# Patient Record
Sex: Male | Born: 1945
Health system: Southern US, Community
[De-identification: ages and names within clinical notes are randomized; demographics above are authoritative.]

## PROBLEM LIST (undated history)

## (undated) DIAGNOSIS — N400 Enlarged prostate without lower urinary tract symptoms: Secondary | ICD-10-CM

## (undated) DIAGNOSIS — K5790 Diverticulosis of intestine, part unspecified, without perforation or abscess without bleeding: Secondary | ICD-10-CM

## (undated) DIAGNOSIS — F32A Depression, unspecified: Secondary | ICD-10-CM

## (undated) DIAGNOSIS — D509 Iron deficiency anemia, unspecified: Secondary | ICD-10-CM

## (undated) DIAGNOSIS — K219 Gastro-esophageal reflux disease without esophagitis: Secondary | ICD-10-CM

## (undated) DIAGNOSIS — R918 Other nonspecific abnormal finding of lung field: Secondary | ICD-10-CM

## (undated) DIAGNOSIS — E079 Disorder of thyroid, unspecified: Secondary | ICD-10-CM

## (undated) DIAGNOSIS — I10 Essential (primary) hypertension: Secondary | ICD-10-CM

## (undated) DIAGNOSIS — E538 Deficiency of other specified B group vitamins: Secondary | ICD-10-CM

## (undated) DIAGNOSIS — E669 Obesity, unspecified: Secondary | ICD-10-CM

## (undated) DIAGNOSIS — C439 Malignant melanoma of skin, unspecified: Secondary | ICD-10-CM

## (undated) DIAGNOSIS — K862 Cyst of pancreas: Secondary | ICD-10-CM

## (undated) HISTORY — DX: Cyst of pancreas: K86.2

## (undated) HISTORY — DX: Deficiency of other specified B group vitamins: E53.8

## (undated) HISTORY — DX: Iron deficiency anemia, unspecified: D50.9

## (undated) HISTORY — DX: Depression, unspecified: F32.A

## (undated) HISTORY — DX: Malignant melanoma of skin, unspecified: C43.9

## (undated) HISTORY — DX: Other nonspecific abnormal finding of lung field: R91.8

## (undated) HISTORY — DX: Essential (primary) hypertension: I10

## (undated) HISTORY — PX: WISDOM TOOTH EXTRACTION: SHX21

## (undated) HISTORY — DX: Disorder of thyroid, unspecified: E07.9

## (undated) HISTORY — DX: Diverticulosis of intestine, part unspecified, without perforation or abscess without bleeding: K57.90

## (undated) HISTORY — DX: Gastro-esophageal reflux disease without esophagitis: K21.9

## (undated) HISTORY — DX: Obesity, unspecified: E66.9

## (undated) HISTORY — DX: Benign prostatic hyperplasia without lower urinary tract symptoms: N40.0

---

## 1998-02-02 HISTORY — PX: CHOLECYSTECTOMY: SHX55

## 1998-02-02 HISTORY — PX: ROUX-EN-Y PROCEDURE: SUR1287

## 2009-02-02 HISTORY — PX: TOTAL SHOULDER REPLACEMENT: SUR1217

## 2010-02-02 HISTORY — PX: MOHS SURGERY: SUR867

## 2011-02-03 LAB — HM COLONOSCOPY

## 2011-04-06 DIAGNOSIS — E559 Vitamin D deficiency, unspecified: Secondary | ICD-10-CM | POA: Diagnosis not present

## 2011-04-06 DIAGNOSIS — I10 Essential (primary) hypertension: Secondary | ICD-10-CM | POA: Diagnosis not present

## 2011-04-06 DIAGNOSIS — E538 Deficiency of other specified B group vitamins: Secondary | ICD-10-CM | POA: Diagnosis not present

## 2011-04-06 DIAGNOSIS — N400 Enlarged prostate without lower urinary tract symptoms: Secondary | ICD-10-CM | POA: Diagnosis not present

## 2011-04-13 DIAGNOSIS — I10 Essential (primary) hypertension: Secondary | ICD-10-CM | POA: Diagnosis not present

## 2011-04-13 DIAGNOSIS — E538 Deficiency of other specified B group vitamins: Secondary | ICD-10-CM | POA: Diagnosis not present

## 2011-04-13 DIAGNOSIS — D649 Anemia, unspecified: Secondary | ICD-10-CM | POA: Diagnosis not present

## 2011-04-13 DIAGNOSIS — N400 Enlarged prostate without lower urinary tract symptoms: Secondary | ICD-10-CM | POA: Diagnosis not present

## 2011-04-13 DIAGNOSIS — E559 Vitamin D deficiency, unspecified: Secondary | ICD-10-CM | POA: Diagnosis not present

## 2011-04-20 DIAGNOSIS — L570 Actinic keratosis: Secondary | ICD-10-CM | POA: Diagnosis not present

## 2011-04-20 DIAGNOSIS — L820 Inflamed seborrheic keratosis: Secondary | ICD-10-CM | POA: Diagnosis not present

## 2011-04-20 DIAGNOSIS — Z85820 Personal history of malignant melanoma of skin: Secondary | ICD-10-CM | POA: Diagnosis not present

## 2011-05-18 DIAGNOSIS — Z1211 Encounter for screening for malignant neoplasm of colon: Secondary | ICD-10-CM | POA: Diagnosis not present

## 2011-05-28 DIAGNOSIS — Z5181 Encounter for therapeutic drug level monitoring: Secondary | ICD-10-CM | POA: Diagnosis not present

## 2011-05-28 DIAGNOSIS — D509 Iron deficiency anemia, unspecified: Secondary | ICD-10-CM | POA: Diagnosis not present

## 2011-05-28 DIAGNOSIS — Z9884 Bariatric surgery status: Secondary | ICD-10-CM | POA: Diagnosis not present

## 2011-05-28 DIAGNOSIS — Z87891 Personal history of nicotine dependence: Secondary | ICD-10-CM | POA: Diagnosis not present

## 2011-05-28 DIAGNOSIS — Z1211 Encounter for screening for malignant neoplasm of colon: Secondary | ICD-10-CM | POA: Diagnosis not present

## 2011-05-28 DIAGNOSIS — K648 Other hemorrhoids: Secondary | ICD-10-CM | POA: Diagnosis not present

## 2011-05-28 DIAGNOSIS — K5730 Diverticulosis of large intestine without perforation or abscess without bleeding: Secondary | ICD-10-CM | POA: Diagnosis not present

## 2011-06-10 DIAGNOSIS — E559 Vitamin D deficiency, unspecified: Secondary | ICD-10-CM | POA: Diagnosis not present

## 2011-11-19 DIAGNOSIS — D235 Other benign neoplasm of skin of trunk: Secondary | ICD-10-CM | POA: Diagnosis not present

## 2011-11-19 DIAGNOSIS — Z85820 Personal history of malignant melanoma of skin: Secondary | ICD-10-CM | POA: Diagnosis not present

## 2011-11-19 DIAGNOSIS — L821 Other seborrheic keratosis: Secondary | ICD-10-CM | POA: Diagnosis not present

## 2011-11-19 DIAGNOSIS — L219 Seborrheic dermatitis, unspecified: Secondary | ICD-10-CM | POA: Diagnosis not present

## 2011-11-25 DIAGNOSIS — H2510 Age-related nuclear cataract, unspecified eye: Secondary | ICD-10-CM | POA: Diagnosis not present

## 2012-01-22 DIAGNOSIS — D649 Anemia, unspecified: Secondary | ICD-10-CM | POA: Diagnosis not present

## 2012-01-22 DIAGNOSIS — E559 Vitamin D deficiency, unspecified: Secondary | ICD-10-CM | POA: Diagnosis not present

## 2012-01-22 DIAGNOSIS — I10 Essential (primary) hypertension: Secondary | ICD-10-CM | POA: Diagnosis not present

## 2012-01-22 DIAGNOSIS — Z9884 Bariatric surgery status: Secondary | ICD-10-CM | POA: Diagnosis not present

## 2012-01-22 DIAGNOSIS — Z23 Encounter for immunization: Secondary | ICD-10-CM | POA: Diagnosis not present

## 2012-01-22 DIAGNOSIS — Z0000 Encounter for general adult medical examination without abnormal findings: Secondary | ICD-10-CM | POA: Diagnosis not present

## 2012-01-22 DIAGNOSIS — R7309 Other abnormal glucose: Secondary | ICD-10-CM | POA: Diagnosis not present

## 2012-01-22 DIAGNOSIS — Z125 Encounter for screening for malignant neoplasm of prostate: Secondary | ICD-10-CM | POA: Diagnosis not present

## 2012-01-22 DIAGNOSIS — E538 Deficiency of other specified B group vitamins: Secondary | ICD-10-CM | POA: Diagnosis not present

## 2012-01-22 DIAGNOSIS — E785 Hyperlipidemia, unspecified: Secondary | ICD-10-CM | POA: Diagnosis not present

## 2012-05-26 DIAGNOSIS — L819 Disorder of pigmentation, unspecified: Secondary | ICD-10-CM | POA: Diagnosis not present

## 2012-05-26 DIAGNOSIS — Z85820 Personal history of malignant melanoma of skin: Secondary | ICD-10-CM | POA: Diagnosis not present

## 2012-05-26 DIAGNOSIS — D485 Neoplasm of uncertain behavior of skin: Secondary | ICD-10-CM | POA: Diagnosis not present

## 2012-05-26 DIAGNOSIS — D235 Other benign neoplasm of skin of trunk: Secondary | ICD-10-CM | POA: Diagnosis not present

## 2012-06-10 DIAGNOSIS — M25569 Pain in unspecified knee: Secondary | ICD-10-CM | POA: Diagnosis not present

## 2012-06-24 DIAGNOSIS — M25569 Pain in unspecified knee: Secondary | ICD-10-CM | POA: Diagnosis not present

## 2012-07-04 DIAGNOSIS — M1710 Unilateral primary osteoarthritis, unspecified knee: Secondary | ICD-10-CM | POA: Diagnosis not present

## 2012-07-06 DIAGNOSIS — M1710 Unilateral primary osteoarthritis, unspecified knee: Secondary | ICD-10-CM | POA: Diagnosis not present

## 2012-11-25 DIAGNOSIS — D235 Other benign neoplasm of skin of trunk: Secondary | ICD-10-CM | POA: Diagnosis not present

## 2012-11-25 DIAGNOSIS — L819 Disorder of pigmentation, unspecified: Secondary | ICD-10-CM | POA: Diagnosis not present

## 2012-11-25 DIAGNOSIS — Z85820 Personal history of malignant melanoma of skin: Secondary | ICD-10-CM | POA: Diagnosis not present

## 2012-12-24 DIAGNOSIS — Z23 Encounter for immunization: Secondary | ICD-10-CM | POA: Diagnosis not present

## 2013-05-26 DIAGNOSIS — D235 Other benign neoplasm of skin of trunk: Secondary | ICD-10-CM | POA: Diagnosis not present

## 2013-05-26 DIAGNOSIS — D1801 Hemangioma of skin and subcutaneous tissue: Secondary | ICD-10-CM | POA: Diagnosis not present

## 2013-05-26 DIAGNOSIS — L570 Actinic keratosis: Secondary | ICD-10-CM | POA: Diagnosis not present

## 2013-09-05 DIAGNOSIS — H524 Presbyopia: Secondary | ICD-10-CM | POA: Diagnosis not present

## 2013-09-05 DIAGNOSIS — H40019 Open angle with borderline findings, low risk, unspecified eye: Secondary | ICD-10-CM | POA: Diagnosis not present

## 2013-10-26 ENCOUNTER — Other Ambulatory Visit (INDEPENDENT_AMBULATORY_CARE_PROVIDER_SITE_OTHER): Payer: Medicare Other

## 2013-10-26 ENCOUNTER — Encounter: Payer: Self-pay | Admitting: Internal Medicine

## 2013-10-26 ENCOUNTER — Ambulatory Visit (INDEPENDENT_AMBULATORY_CARE_PROVIDER_SITE_OTHER): Payer: Medicare Other | Admitting: Internal Medicine

## 2013-10-26 VITALS — BP 130/70 | HR 59 | Temp 97.7°F | Ht 71.85 in | Wt 224.5 lb

## 2013-10-26 DIAGNOSIS — E538 Deficiency of other specified B group vitamins: Secondary | ICD-10-CM | POA: Diagnosis not present

## 2013-10-26 DIAGNOSIS — Z23 Encounter for immunization: Secondary | ICD-10-CM

## 2013-10-26 DIAGNOSIS — I10 Essential (primary) hypertension: Secondary | ICD-10-CM | POA: Insufficient documentation

## 2013-10-26 DIAGNOSIS — D509 Iron deficiency anemia, unspecified: Secondary | ICD-10-CM | POA: Diagnosis not present

## 2013-10-26 DIAGNOSIS — E669 Obesity, unspecified: Secondary | ICD-10-CM | POA: Insufficient documentation

## 2013-10-26 DIAGNOSIS — C439 Malignant melanoma of skin, unspecified: Secondary | ICD-10-CM | POA: Insufficient documentation

## 2013-10-26 DIAGNOSIS — N400 Enlarged prostate without lower urinary tract symptoms: Secondary | ICD-10-CM | POA: Insufficient documentation

## 2013-10-26 LAB — CBC WITH DIFFERENTIAL/PLATELET
Basophils Absolute: 0 10*3/uL (ref 0.0–0.1)
Basophils Relative: 0.4 % (ref 0.0–3.0)
Eosinophils Absolute: 0.1 10*3/uL (ref 0.0–0.7)
Eosinophils Relative: 1.4 % (ref 0.0–5.0)
HCT: 46.5 % (ref 39.0–52.0)
Hemoglobin: 15.4 g/dL (ref 13.0–17.0)
Lymphocytes Relative: 23.6 % (ref 12.0–46.0)
Lymphs Abs: 2.1 10*3/uL (ref 0.7–4.0)
MCHC: 33.2 g/dL (ref 30.0–36.0)
MCV: 88.5 fl (ref 78.0–100.0)
Monocytes Absolute: 0.9 10*3/uL (ref 0.1–1.0)
Monocytes Relative: 10.1 % (ref 3.0–12.0)
Neutro Abs: 5.8 10*3/uL (ref 1.4–7.7)
Neutrophils Relative %: 64.5 % (ref 43.0–77.0)
Platelets: 189 10*3/uL (ref 150.0–400.0)
RBC: 5.25 Mil/uL (ref 4.22–5.81)
RDW: 13.8 % (ref 11.5–15.5)
WBC: 9.1 10*3/uL (ref 4.0–10.5)

## 2013-10-26 LAB — FERRITIN: Ferritin: 50.2 ng/mL (ref 22.0–322.0)

## 2013-10-26 LAB — VITAMIN B12: Vitamin B-12: 973 pg/mL — ABNORMAL HIGH (ref 211–911)

## 2013-10-26 MED ORDER — CALCIUM GLUCONATE 500 MG PO TABS: 1.0000 | ORAL_TABLET | Freq: Two times a day (BID) | ORAL | Status: DC

## 2013-10-26 MED ORDER — THERA VITAL M PO TABS: 1.0000 | ORAL_TABLET | Freq: Every day | ORAL | Status: DC

## 2013-10-26 MED ORDER — VITAMIN D 50 MCG (2000 UT) PO TABS: 2000.0000 [IU] | ORAL_TABLET | Freq: Every day | ORAL | Status: DC

## 2013-10-26 NOTE — Progress Notes (Signed)
Pre visit review using our clinic review tool, if applicable. No additional management support is needed unless otherwise documented below in the visit note. 

## 2013-10-26 NOTE — Assessment & Plan Note (Signed)
The current medical regimen is effective;  continue present plan and medications.  

## 2013-10-26 NOTE — Progress Notes (Signed)
Subjective:    Patient ID: Maxwell Fox, male    DOB: 02/10/1945, 68 y.o.   MRN: [id number]  HPI  New patient to me and our system, here to establish with a PCP  Also reviewed chronic medical issues:  Moderate obesity status post Roux-en-Y gastric bypass in 2000. History of associated malabsorption with iron and B12 deficiency because of same. Not currently on vitamin supplementation. Reports weight has been stable over past several years  Hypertension - the patient reports compliance with medication(s) as prescribed. Denies adverse side effects.  BPH - or same. Denies change in urination, frequency or nocturia. Last urology evaluation over 2 years ago while in [place]  History of [place]. Follows with dermatology every 3-6 months for same. Reports no skin changes and uses sunblock /avoids UV sun exposure   Past Medical History  Diagnosis Date  . Obesity     s/p gastric bypass 2000, start 355#  . Iron deficiency anemia     malabsorption related (s/p gastric bypass)  . B12 nutritional deficiency     malabsorbtion  . Hypertension   . BPH (benign prostatic hypertrophy)   . Melanoma    Family History  Problem Relation Age of Onset  . Breast cancer Sister   . Uterine cancer Sister   . Stomach cancer Sister   . Hypertension Father   . Macular degeneration Mother    History  Substance Use Topics  . Smoking status: Former Smoker    Quit date: 02/03/1963  . Smokeless tobacco: Not on file  . Alcohol Use: Not on file     Comment: wine 2x/week    Review of Systems  Constitutional: Positive for fatigue. Negative for fever, activity change, appetite change and unexpected weight change.  Respiratory: Negative for cough, chest tightness, shortness of breath and wheezing.   Cardiovascular: Negative for chest pain, palpitations and leg swelling.  Neurological: Negative for dizziness, weakness and headaches.  Psychiatric/Behavioral: Negative for dysphoric mood. The patient is  not nervous/anxious.   All other systems reviewed and are negative.      Objective:   Physical Exam  BP 130/70  Pulse 59  Temp(Src) 97.7 F (36.5 C) (Oral)  Ht 5' 11.85" (1.825 m)  Wt 224 lb 8 oz (101.833 kg)  BMI 30.57 kg/m2  SpO2 97% Wt Readings from Last 3 Encounters:  10/26/13 224 lb 8 oz (101.833 kg)   Constitutional: he is obese, appears well-developed and well-nourished. No distress. wife at side HENT: Head: Normocephalic and atraumatic. Ears: B TMs ok, no erythema or effusion; Nose: Nose normal. Mouth/Throat: Oropharynx is clear and moist. No oropharyngeal exudate.  Eyes: Conjunctivae and EOM are normal. Pupils are equal, round, and reactive to light. No scleral icterus.  Neck: Normal range of motion. Neck supple. No JVD present. No thyromegaly present.  Cardiovascular: Normal rate, regular rhythm and normal heart sounds.  No murmur heard. No BLE edema. Pulmonary/Chest: Effort normal and breath sounds normal. No respiratory distress. he has no wheezes.  Abdominal: Soft. Bowel sounds are normal. he exhibits no distension. There is no tenderness. no masses GU: defer Musculoskeletal: Normal range of motion, no joint effusions. No gross deformities Neurological: he is alert and oriented to person, place, and time. No cranial nerve deficit. Coordination, balance, strength, speech and gait are normal.  Skin: Skin is warm and dry. No rash noted. No erythema.  Psychiatric: he has a normal mood and affect. behavior is normal. Judgment and thought content normal.   No results found  for this basename: WBC, HGB, HCT, PLT, GLUCOSE, CHOL, TRIG, HDL, LDLDIRECT, LDLCALC, ALT, AST, NA, K, CL, CREATININE, BUN, CO2, TSH, PSA, INR, GLUF, HGBA1C, MICROALBUR    Patient was never admitted.     Assessment & Plan:   Problem List Items Addressed This Visit   B12 nutritional deficiency     History of malabsorption causing B12 deficiency from prior gastric bypass Not on oral or sublingual  supplementation due to ineffective treatment of same in past Increasing fatigue symptoms, potential relationship reviewed Check level, replace IM as needed    Relevant Orders      CBC with Differential (Completed)      Vitamin B12 (Completed)   BPH (benign prostatic hypertrophy)     The current medical regimen is effective;  continue present plan and medications.     Relevant Medications      tamsulosin (FLOMAX) capsule 0.4 mg   Hypertension      BP Readings from Last 3 Encounters:  10/26/13 130/70   The current medical regimen is effective;  continue present plan and medications.     Relevant Medications      amLODIpine (NORVASC) tablet      lisinopril (PRINIVIL,ZESTRIL) tablet      metoprolol succinate (TOPROL-XL) 24 hr tablet   Iron deficiency anemia     Required IV iron infusion in 2012, poor response to oral iron prescription Check CBC with ferritin now, arrange replacement as needed    Relevant Orders      CBC with Differential (Completed)      Ferritin (Completed)   Obesity - Primary     Roux-en-Y bypass 3149, complicated by nutritional malabsorption specifically iron and B12 in past Reports requirement for IV iron infusion in past as oral iron supplementation ineffective Prior borderline diabetes and dyslipidemia have been well-controlled since surgery Check labs now The patient is asked to make an attempt to improve diet and exercise patterns to aid in medical management of this problem.      Other Visit Diagnoses   Need for prophylactic vaccination against Streptococcus pneumoniae (pneumococcus)        Relevant Orders       Pneumococcal polysaccharide vaccine 23-valent greater than or equal to 2yo subcutaneous/IM (Completed)    Need for prophylactic vaccination and inoculation against influenza        Relevant Orders       Flu Vaccine QUAD 36+ mos PF IM (Fluarix Quad PF) (Completed)      Release of information sent to prior PCP in [place] for history on  immunizations and colon screening

## 2013-10-26 NOTE — Assessment & Plan Note (Signed)
BP Readings from Last 3 Encounters:  10/26/13 130/70   The current medical regimen is effective;  continue present plan and medications.

## 2013-10-26 NOTE — Patient Instructions (Addendum)
It was good to see you today.  We have reviewed your prior records including labs and tests today  we will send to your prior provider(s) for "release of records" as discussed today.  Your annual flu shot and pneumonia vaccine (every 5 years) was given and/or updated today. Consider Shingles vaccine if approved by insurance (you check and let me know)  Test(s) ordered today. Your results will be released to [place] (or called to you) after review, usually within 72hours after test completion. If any changes need to be made, you will be notified at that same time.  Medications reviewed and updated, no changes recommended at this time. Refill on medication(s) as discussed today.  Please schedule followup in 6 months for annual exam and labs (fasting for cholesterol check), call sooner if problems.

## 2013-10-26 NOTE — Assessment & Plan Note (Signed)
Required IV iron infusion in 2012, poor response to oral iron prescription Check CBC with ferritin now, arrange replacement as needed

## 2013-10-26 NOTE — Assessment & Plan Note (Signed)
Roux-en-Y bypass 1410, complicated by nutritional malabsorption specifically iron and B12 in past Reports requirement for IV iron infusion in past as oral iron supplementation ineffective Prior borderline diabetes and dyslipidemia have been well-controlled since surgery Check labs now The patient is asked to make an attempt to improve diet and exercise patterns to aid in medical management of this problem.

## 2013-10-26 NOTE — Assessment & Plan Note (Signed)
History of malabsorption causing B12 deficiency from prior gastric bypass Not on oral or sublingual supplementation due to ineffective treatment of same in past Increasing fatigue symptoms, potential relationship reviewed Check level, replace IM as needed

## 2013-11-28 DIAGNOSIS — L814 Other melanin hyperpigmentation: Secondary | ICD-10-CM | POA: Diagnosis not present

## 2013-11-28 DIAGNOSIS — D225 Melanocytic nevi of trunk: Secondary | ICD-10-CM | POA: Diagnosis not present

## 2013-11-28 DIAGNOSIS — Z08 Encounter for follow-up examination after completed treatment for malignant neoplasm: Secondary | ICD-10-CM | POA: Diagnosis not present

## 2013-11-28 DIAGNOSIS — Z85820 Personal history of malignant melanoma of skin: Secondary | ICD-10-CM | POA: Diagnosis not present

## 2014-01-12 ENCOUNTER — Encounter: Payer: Self-pay | Admitting: Internal Medicine

## 2014-02-27 DIAGNOSIS — J209 Acute bronchitis, unspecified: Secondary | ICD-10-CM | POA: Diagnosis not present

## 2014-05-29 DIAGNOSIS — Z08 Encounter for follow-up examination after completed treatment for malignant neoplasm: Secondary | ICD-10-CM | POA: Diagnosis not present

## 2014-05-29 DIAGNOSIS — L814 Other melanin hyperpigmentation: Secondary | ICD-10-CM | POA: Diagnosis not present

## 2014-05-29 DIAGNOSIS — Z85820 Personal history of malignant melanoma of skin: Secondary | ICD-10-CM | POA: Diagnosis not present

## 2014-05-29 DIAGNOSIS — D225 Melanocytic nevi of trunk: Secondary | ICD-10-CM | POA: Diagnosis not present

## 2014-06-27 ENCOUNTER — Encounter: Payer: Self-pay | Admitting: Internal Medicine

## 2014-11-01 ENCOUNTER — Encounter: Payer: Self-pay | Admitting: Internal Medicine

## 2014-11-01 ENCOUNTER — Other Ambulatory Visit (INDEPENDENT_AMBULATORY_CARE_PROVIDER_SITE_OTHER): Payer: Medicare Other

## 2014-11-01 ENCOUNTER — Ambulatory Visit (INDEPENDENT_AMBULATORY_CARE_PROVIDER_SITE_OTHER): Payer: Medicare Other | Admitting: Internal Medicine

## 2014-11-01 VITALS — BP 132/70 | HR 55 | Temp 97.6°F | Ht 71.75 in | Wt 220.2 lb

## 2014-11-01 DIAGNOSIS — Z9884 Bariatric surgery status: Secondary | ICD-10-CM

## 2014-11-01 DIAGNOSIS — R5383 Other fatigue: Secondary | ICD-10-CM

## 2014-11-01 DIAGNOSIS — D509 Iron deficiency anemia, unspecified: Secondary | ICD-10-CM | POA: Diagnosis not present

## 2014-11-01 DIAGNOSIS — K219 Gastro-esophageal reflux disease without esophagitis: Secondary | ICD-10-CM

## 2014-11-01 DIAGNOSIS — Z0000 Encounter for general adult medical examination without abnormal findings: Secondary | ICD-10-CM

## 2014-11-01 DIAGNOSIS — I10 Essential (primary) hypertension: Secondary | ICD-10-CM

## 2014-11-01 DIAGNOSIS — Z23 Encounter for immunization: Secondary | ICD-10-CM

## 2014-11-01 DIAGNOSIS — Z1159 Encounter for screening for other viral diseases: Secondary | ICD-10-CM | POA: Diagnosis not present

## 2014-11-01 DIAGNOSIS — E538 Deficiency of other specified B group vitamins: Secondary | ICD-10-CM | POA: Diagnosis not present

## 2014-11-01 LAB — CBC WITH DIFFERENTIAL/PLATELET
Basophils Absolute: 0 10*3/uL (ref 0.0–0.1)
Basophils Relative: 0.5 % (ref 0.0–3.0)
Eosinophils Absolute: 0.1 10*3/uL (ref 0.0–0.7)
Eosinophils Relative: 1.5 % (ref 0.0–5.0)
HCT: 46.2 % (ref 39.0–52.0)
Hemoglobin: 15.6 g/dL (ref 13.0–17.0)
Lymphocytes Relative: 26.1 % (ref 12.0–46.0)
Lymphs Abs: 2 10*3/uL (ref 0.7–4.0)
MCHC: 33.8 g/dL (ref 30.0–36.0)
MCV: 88.5 fl (ref 78.0–100.0)
Monocytes Absolute: 0.8 10*3/uL (ref 0.1–1.0)
Monocytes Relative: 9.8 % (ref 3.0–12.0)
Neutro Abs: 4.9 10*3/uL (ref 1.4–7.7)
Neutrophils Relative %: 62.1 % (ref 43.0–77.0)
Platelets: 167 10*3/uL (ref 150.0–400.0)
RBC: 5.22 Mil/uL (ref 4.22–5.81)
RDW: 14.1 % (ref 11.5–15.5)
WBC: 7.8 10*3/uL (ref 4.0–10.5)

## 2014-11-01 LAB — HEPATIC FUNCTION PANEL
ALT: 18 U/L (ref 0–53)
AST: 23 U/L (ref 0–37)
Albumin: 4 g/dL (ref 3.5–5.2)
Alkaline Phosphatase: 36 U/L — ABNORMAL LOW (ref 39–117)
Bilirubin, Direct: 0.1 mg/dL (ref 0.0–0.3)
Total Bilirubin: 0.6 mg/dL (ref 0.2–1.2)
Total Protein: 7.1 g/dL (ref 6.0–8.3)

## 2014-11-01 LAB — BASIC METABOLIC PANEL
BUN: 18 mg/dL (ref 6–23)
CO2: 29 mEq/L (ref 19–32)
Calcium: 9.9 mg/dL (ref 8.4–10.5)
Chloride: 102 mEq/L (ref 96–112)
Creatinine, Ser: 1.1 mg/dL (ref 0.40–1.50)
GFR: 70.42 mL/min (ref >60.00)
Glucose, Bld: 107 mg/dL — ABNORMAL HIGH (ref 70–99)
Potassium: 4.8 mEq/L (ref 3.5–5.1)
Sodium: 139 mEq/L (ref 135–145)

## 2014-11-01 LAB — LIPID PANEL
Cholesterol: 172 mg/dL (ref 0–200)
HDL: 72.8 mg/dL (ref >39.00)
LDL Cholesterol: 73 mg/dL (ref 0–99)
NonHDL: 99.26
Total CHOL/HDL Ratio: 2
Triglycerides: 132 mg/dL (ref 0.0–149.0)
VLDL: 26.4 mg/dL (ref 0.0–40.0)

## 2014-11-01 LAB — TSH: TSH: 2.39 u[IU]/mL (ref 0.35–4.50)

## 2014-11-01 LAB — VITAMIN B12: Vitamin B-12: 703 pg/mL (ref 211–911)

## 2014-11-01 LAB — FERRITIN: Ferritin: 82.6 ng/mL (ref 22.0–322.0)

## 2014-11-01 LAB — VITAMIN D 25 HYDROXY (VIT D DEFICIENCY, FRACTURES): VITD: 47.11 ng/mL (ref 30.00–100.00)

## 2014-11-01 MED ORDER — LISINOPRIL 20 MG PO TABS: 20.0000 mg | ORAL_TABLET | Freq: Two times a day (BID) | ORAL | Status: DC

## 2014-11-01 MED ORDER — RANITIDINE HCL 150 MG PO TABS: 150.0000 mg | ORAL_TABLET | Freq: Every day | ORAL | Status: DC

## 2014-11-01 MED ORDER — AMLODIPINE BESYLATE 5 MG PO TABS: 5.0000 mg | ORAL_TABLET | Freq: Every day | ORAL | Status: DC

## 2014-11-01 MED ORDER — METOPROLOL SUCCINATE ER 100 MG PO TB24: 100.0000 mg | ORAL_TABLET | Freq: Every day | ORAL | Status: DC

## 2014-11-01 NOTE — Progress Notes (Signed)
Pre visit review using our clinic review tool, if applicable. No additional management support is needed unless otherwise documented below in the visit note. 

## 2014-11-01 NOTE — Assessment & Plan Note (Signed)
Patient describes increased acid reflux and epigastric pain daily for the past 8 months. Self treatment with over-the-counter Zantac has improved symptoms but not resolved recurrence. Self-directed treatment as noted from the Internet to treat acid reflux has not improved symptoms. Given history of gastric bypass with other nutritional deficiencies in the past, reluctant to use PPI. Continue prescription dose H2 blocker and refer to GI for further evaluation

## 2014-11-01 NOTE — Assessment & Plan Note (Signed)
History of malabsorption causing B12 deficiency from prior gastric bypass in 2000 Not on oral or sublingual supplementation due to ineffective treatment of same in past Increasing fatigue symptoms, potential relationship reviewed Check level, replace IM as needed

## 2014-11-01 NOTE — Assessment & Plan Note (Signed)
BP Readings from Last 3 Encounters:  11/01/14 132/70  10/26/13 130/70   The current medical regimen is effective;  continue present plan and medications.

## 2014-11-01 NOTE — Progress Notes (Signed)
Subjective:    Patient ID: Maxwell Fox, male    DOB: [birth year], 69 y.o.   MRN: [id number]  HPI   Here for medicare wellness  Diet: heart healthy Physical activity: sedentary Depression/mood screen: negative Hearing: intact to whispered voice Visual acuity: grossly normal, performs annual eye exam  ADLs: capable Fall risk: none Home safety: good Cognitive evaluation: intact to orientation, naming, recall and repetition EOL planning: adv directives, full code/ I agree  I have personally reviewed and have noted 1. The patient's medical and social history 2. Their use of alcohol, tobacco or illicit drugs 3. Their current medications and supplements 4. The patient's functional ability including ADL's, fall risks, home safety risks and hearing or visual impairment. 5. Diet and physical activities 6. Evidence for depression or mood disorders  Also reviewed chronic medical conditions, interval events and current concerns   Past Medical History  Diagnosis Date  . Obesity     s/p gastric bypass 2000, start 355#  . Iron deficiency anemia     malabsorption related (s/p gastric bypass)  . B12 nutritional deficiency     malabsorbtion  . Hypertension   . BPH (benign prostatic hypertrophy)   . Melanoma    Family History  Problem Relation Age of Onset  . Breast cancer Sister   . Uterine cancer Sister   . Stomach cancer Sister   . Hypertension Father   . Macular degeneration Mother    Social History  Substance Use Topics  . Smoking status: Former Smoker    Quit date: 02/03/1963  . Smokeless tobacco: None  . Alcohol Use: None     Comment: wine 2x/week    Review of Systems  Constitutional: Negative for fever, activity change, appetite change, fatigue and unexpected weight change.  Respiratory: Negative for cough, chest tightness, shortness of breath and wheezing.   Cardiovascular: Negative for chest pain, palpitations and leg swelling.  Gastrointestinal: Positive for  abdominal pain. Negative for nausea, vomiting, diarrhea, constipation, blood in stool, abdominal distention and anal bleeding.       New reflux symptoms - abd pain in epigstric region, resolved with Zantac x 4-6 h, occ daily  Neurological: Negative for dizziness, weakness and headaches.  Psychiatric/Behavioral: Negative for dysphoric mood. The patient is not nervous/anxious.   All other systems reviewed and are negative.  Patient Care Team: [name], MD as PCP - General (Internal Medicine)     Objective:    Physical Exam  Constitutional: He is oriented to person, place, and time. He appears well-developed and well-nourished. No distress.  obese  Cardiovascular: Normal rate, regular rhythm and normal heart sounds.   No murmur heard. Pulmonary/Chest: Effort normal and breath sounds normal. No respiratory distress.  Abdominal: Soft. Bowel sounds are normal. He exhibits no distension and no mass. There is no tenderness. There is no rebound and no guarding.  Musculoskeletal: He exhibits no edema.  Neurological: He is alert and oriented to person, place, and time. He has normal reflexes. No cranial nerve deficit.  Psychiatric: He has a normal mood and affect. His behavior is normal. Judgment and thought content normal.    BP 132/70 mmHg  Pulse 55  Temp(Src) 97.6 F (36.4 C) (Oral)  Ht 5' 11.75" (1.822 m)  Wt 220 lb 4 oz (99.905 kg)  BMI 30.09 kg/m2  SpO2 99% Wt Readings from Last 3 Encounters:  11/01/14 220 lb 4 oz (99.905 kg)  10/26/13 224 lb 8 oz (101.833 kg)    Lab Results  Component Value Date   WBC 9.1 10/26/2013   HGB 15.4 10/26/2013   HCT 46.5 10/26/2013   PLT 189.0 10/26/2013    Patient was never admitted.     Assessment & Plan:   AWV/z00.00 - Today patient counseled on age appropriate routine health concerns for screening and prevention, each reviewed and up to date or declined. Immunizations reviewed and up to date or declined. Labs ordered and  reviewed. Risk factors for depression reviewed and negative. Hearing function and visual acuity are intact. ADLs screened and addressed as needed. Functional ability and level of safety reviewed and appropriate. Education, counseling and referrals performed based on assessed risks today. Patient provided with a copy of personalized plan for preventive services.  Problem List Items Addressed This Visit    B12 nutritional deficiency    History of malabsorption causing B12 deficiency from prior gastric bypass in 2000 Not on oral or sublingual supplementation due to ineffective treatment of same in past Increasing fatigue symptoms, potential relationship reviewed Check level, replace IM as needed      Relevant Orders   Vitamin B12   Ferritin   GERD (gastroesophageal reflux disease)    Patient describes increased acid reflux and epigastric pain daily for the past 8 months. Self treatment with over-the-counter Zantac has improved symptoms but not resolved recurrence. Self-directed treatment as noted from the Internet to treat acid reflux has not improved symptoms. Given history of gastric bypass with other nutritional deficiencies in the past, reluctant to use PPI. Continue prescription dose H2 blocker and refer to GI for further evaluation      Relevant Medications   ranitidine (ZANTAC) 150 MG tablet   Other Relevant Orders   Ferritin   Ambulatory referral to Gastroenterology   Hypertension    BP Readings from Last 3 Encounters:  11/01/14 132/70  10/26/13 130/70   The current medical regimen is effective;  continue present plan and medications.       Relevant Medications   lisinopril (PRINIVIL,ZESTRIL) 20 MG tablet   amLODipine (NORVASC) 5 MG tablet   metoprolol succinate (TOPROL-XL) 100 MG 24 hr tablet   Other Relevant Orders   Basic metabolic panel   Lipid panel   Ferritin   Iron deficiency anemia    Required IV iron infusion in 2012, poor response to oral iron prescription Check  CBC with ferritin now, arrange replacement as needed      Relevant Orders   CBC with Differential/Platelet   Ferritin   Ambulatory referral to Gastroenterology    Other Visit Diagnoses    Routine general medical examination at a health care facility    -  Primary    Relevant Orders    Basic metabolic panel    CBC with Differential/Platelet    Hepatic function panel    Lipid panel    TSH    Ferritin    Vit D  25 hydroxy (rtn osteoporosis monitoring)    Need for prophylactic vaccination and inoculation against influenza        Relevant Orders    Flu Vaccine QUAD 36+ mos IM (Completed)    Ferritin    Other fatigue        Relevant Orders    Ferritin    Vit D  25 hydroxy (rtn osteoporosis monitoring)    Ambulatory referral to Gastroenterology    Gastric bypass status for obesity        Relevant Orders    Vitamin B12    Ferritin  Vit D  25 hydroxy (rtn osteoporosis monitoring)    Ambulatory referral to Gastroenterology    Need for hepatitis C screening test        Relevant Orders    Hepatitis C antibody        Gwendolyn Grant, MD

## 2014-11-01 NOTE — Patient Instructions (Addendum)
It was good to see you today.  We have reviewed your prior records including labs and tests today  Health Maintenance reviewed - annual flu shot updated today. Please return for nurse visit to complete other immunizations including tetanus, shingles vaccine and Prevnar 13 pneumonia vaccine as discussed. All other recommended immunizations and age-appropriate screenings are up-to-date.  Test(s) ordered today. Your results will be released to [place] (or called to you) after review, usually within 72hours after test completion. If any changes need to be made, you will be notified at that same time.  Medications reviewed and updated: Continue Zantac 150 mg daily for reflux symptoms. No other changes recommended at this time. Refill on medication(s) as discussed today.  we'll make referral to gastroenterology for evaluation of your gastric reflux. Our office will contact you regarding appointment(s) once made.  Please schedule followup in 12 months for annual exam and labs, call sooner if problems.  Health Maintenance A healthy lifestyle and preventative care can promote health and wellness.  Maintain regular health, dental, and eye exams.  Eat a healthy diet. Foods like vegetables, fruits, whole grains, low-fat dairy products, and lean protein foods contain the nutrients you need and are low in calories. Decrease your intake of foods high in solid fats, added sugars, and salt. Get information about a proper diet from your health care provider, if necessary.  Regular physical exercise is one of the most important things you can do for your health. Most adults should get at least 150 minutes of moderate-intensity exercise (any activity that increases your heart rate and causes you to sweat) each week. In addition, most adults need muscle-strengthening exercises on 2 or more days a week.   Maintain a healthy weight. The body mass index (BMI) is a screening tool to identify possible weight problems.  It provides an estimate of body fat based on height and weight. Your health care provider can find your BMI and can help you achieve or maintain a healthy weight. For males 20 years and older:  A BMI below 18.5 is considered underweight.  A BMI of 18.5 to 24.9 is normal.  A BMI of 25 to 29.9 is considered overweight.  A BMI of 30 and above is considered obese.  Maintain normal blood lipids and cholesterol by exercising and minimizing your intake of saturated fat. Eat a balanced diet with plenty of fruits and vegetables. Blood tests for lipids and cholesterol should begin at age 61 and be repeated every 5 years. If your lipid or cholesterol levels are high, you are over age 29, or you are at high risk for heart disease, you may need your cholesterol levels checked more frequently.Ongoing high lipid and cholesterol levels should be treated with medicines if diet and exercise are not working.  If you smoke, find out from your health care provider how to quit. If you do not use tobacco, do not start.  Lung cancer screening is recommended for adults aged 75-80 years who are at high risk for developing lung cancer because of a history of smoking. A yearly low-dose CT scan of the lungs is recommended for people who have at least a 30-pack-year history of smoking and are current smokers or have quit within the past 15 years. A pack year of smoking is smoking an average of 1 pack of cigarettes a day for 1 year (for example, a 30-pack-year history of smoking could mean smoking 1 pack a day for 30 years or 2 packs a day for  15 years). Yearly screening should continue until the smoker has stopped smoking for at least 15 years. Yearly screening should be stopped for people who develop a health problem that would prevent them from having lung cancer treatment.  If you choose to drink alcohol, do not have more than 2 drinks per day. One drink is considered to be 12 oz (360 mL) of beer, 5 oz (150 mL) of wine, or  1.5 oz (45 mL) of liquor.  Avoid the use of street drugs. Do not share needles with anyone. Ask for help if you need support or instructions about stopping the use of drugs.  High blood pressure causes heart disease and increases the risk of stroke. Blood pressure should be checked at least every 1-2 years. Ongoing high blood pressure should be treated with medicines if weight loss and exercise are not effective.  If you are 62-24 years old, ask your health care provider if you should take aspirin to prevent heart disease.  Diabetes screening involves taking a blood sample to check your fasting blood sugar level. This should be done once every 3 years after age 70 if you are at a normal weight and without risk factors for diabetes. Testing should be considered at a younger age or be carried out more frequently if you are overweight and have at least 1 risk factor for diabetes.  Colorectal cancer can be detected and often prevented. Most routine colorectal cancer screening begins at the age of 30 and continues through age 67. However, your health care provider may recommend screening at an earlier age if you have risk factors for colon cancer. On a yearly basis, your health care provider may provide home test kits to check for hidden blood in the stool. A small camera at the end of a tube may be used to directly examine the colon (sigmoidoscopy or colonoscopy) to detect the earliest forms of colorectal cancer. Talk to your health care provider about this at age 58 when routine screening begins. A direct exam of the colon should be repeated every 5-10 years through age 38, unless early forms of precancerous polyps or small growths are found.  People who are at an increased risk for hepatitis B should be screened for this virus. You are considered at high risk for hepatitis B if:  You were born in a country where hepatitis B occurs often. Talk with your health care provider about which countries are  considered high risk.  Your parents were born in a high-risk country and you have not received a shot to protect against hepatitis B (hepatitis B vaccine).  You have HIV or AIDS.  You use needles to inject street drugs.  You live with, or have sex with, someone who has hepatitis B.  You are a man who has sex with other men (MSM).  You get hemodialysis treatment.  You take certain medicines for conditions like cancer, organ transplantation, and autoimmune conditions.  Hepatitis C blood testing is recommended for all people born from [birth year] through 1965 and any individual with known risk factors for hepatitis C.  Healthy men should no longer receive prostate-specific antigen (PSA) blood tests as part of routine cancer screening. Talk to your health care provider about prostate cancer screening.  Testicular cancer screening is not recommended for adolescents or adult males who have no symptoms. Screening includes self-exam, a health care provider exam, and other screening tests. Consult with your health care provider about any symptoms you have or any concerns  you have about testicular cancer.  Practice safe sex. Use condoms and avoid high-risk sexual practices to reduce the spread of sexually transmitted infections (STIs).  You should be screened for STIs, including gonorrhea and chlamydia if:  You are sexually active and are younger than 24 years.  You are older than 24 years, and your health care provider tells you that you are at risk for this type of infection.  Your sexual activity has changed since you were last screened, and you are at an increased risk for chlamydia or gonorrhea. Ask your health care provider if you are at risk.  If you are at risk of being infected with HIV, it is recommended that you take a prescription medicine daily to prevent HIV infection. This is called pre-exposure prophylaxis (PrEP). You are considered at risk if:  You are a man who has sex with other  men (MSM).  You are a heterosexual man who is sexually active with multiple partners.  You take drugs by injection.  You are sexually active with a partner who has HIV.  Talk with your health care provider about whether you are at high risk of being infected with HIV. If you choose to begin PrEP, you should first be tested for HIV. You should then be tested every 3 months for as long as you are taking PrEP.  Use sunscreen. Apply sunscreen liberally and repeatedly throughout the day. You should seek shade when your shadow is shorter than you. Protect yourself by wearing long sleeves, pants, a wide-brimmed hat, and sunglasses year round whenever you are outdoors.  Tell your health care provider of new moles or changes in moles, especially if there is a change in shape or color. Also, tell your health care provider if a mole is larger than the size of a pencil eraser.  A one-time screening for abdominal aortic aneurysm (AAA) and surgical repair of large AAAs by ultrasound is recommended for men aged 34-75 years who are current or former smokers.  Stay current with your vaccines (immunizations). Document Released: 07/18/2007 Document Revised: 01/24/2013 Document Reviewed: 06/16/2010 [facility] Patient Information 2015 [place], [place]. This information is not intended to replace advice given to you by your health care provider. Make sure you discuss any questions you have with your health care provider. Food Choices for Gastroesophageal Reflux Disease When you have gastroesophageal reflux disease (GERD), the foods you eat and your eating habits are very important. Choosing the right foods can help ease the discomfort of GERD. WHAT GENERAL GUIDELINES DO I NEED TO FOLLOW?  Choose fruits, vegetables, whole grains, low-fat dairy products, and low-fat meat, fish, and poultry.  Limit fats such as oils, salad dressings, butter, nuts, and avocado.  Keep a food diary to identify foods that cause  symptoms.  Avoid foods that cause reflux. These may be different for different people.  Eat frequent small meals instead of three large meals each day.  Eat your meals slowly, in a relaxed setting.  Limit fried foods.  Cook foods using methods other than frying.  Avoid drinking alcohol.  Avoid drinking large amounts of liquids with your meals.  Avoid bending over or lying down until 2-3 hours after eating. WHAT FOODS ARE NOT RECOMMENDED? The following are some foods and drinks that may worsen your symptoms: Vegetables Tomatoes. Tomato juice. Tomato and spaghetti sauce. Chili peppers. Onion and garlic. Horseradish. Fruits Oranges, grapefruit, and lemon (fruit and juice). Meats High-fat meats, fish, and poultry. This includes hot dogs, ribs, ham, sausage,  salami, and bacon. Dairy Whole milk and chocolate milk. Sour cream. Cream. Butter. Ice cream. Cream cheese.  Beverages Coffee and tea, with or without caffeine. Carbonated beverages or energy drinks. Condiments Hot sauce. Barbecue sauce.  Sweets/Desserts Chocolate and cocoa. Donuts. Peppermint and spearmint. Fats and Oils High-fat foods, including [place] fries and potato chips. Other Vinegar. Strong spices, such as black pepper, white pepper, red pepper, cayenne, curry powder, cloves, ginger, and chili powder. The items listed above may not be a complete list of foods and beverages to avoid. Contact your dietitian for more information. Document Released: 01/19/2005 Document Revised: 01/24/2013 Document Reviewed: 11/23/2012 [facility] Patient Information 2015 [place], [place]. This information is not intended to replace advice given to you by your health care provider. Make sure you discuss any questions you have with your health care provider.

## 2014-11-01 NOTE — Assessment & Plan Note (Signed)
Required IV iron infusion in 2012, poor response to oral iron prescription Check CBC with ferritin now, arrange replacement as needed

## 2014-11-02 LAB — HEPATITIS C ANTIBODY: HCV Ab: NEGATIVE

## 2014-11-22 ENCOUNTER — Encounter: Payer: Self-pay | Admitting: Internal Medicine

## 2014-11-27 DIAGNOSIS — D225 Melanocytic nevi of trunk: Secondary | ICD-10-CM | POA: Diagnosis not present

## 2014-11-27 DIAGNOSIS — Z08 Encounter for follow-up examination after completed treatment for malignant neoplasm: Secondary | ICD-10-CM | POA: Diagnosis not present

## 2014-11-27 DIAGNOSIS — Z85820 Personal history of malignant melanoma of skin: Secondary | ICD-10-CM | POA: Diagnosis not present

## 2014-11-27 DIAGNOSIS — L821 Other seborrheic keratosis: Secondary | ICD-10-CM | POA: Diagnosis not present

## 2015-01-18 DIAGNOSIS — H52203 Unspecified astigmatism, bilateral: Secondary | ICD-10-CM | POA: Diagnosis not present

## 2015-01-18 DIAGNOSIS — H40013 Open angle with borderline findings, low risk, bilateral: Secondary | ICD-10-CM | POA: Diagnosis not present

## 2015-01-18 DIAGNOSIS — H524 Presbyopia: Secondary | ICD-10-CM | POA: Diagnosis not present

## 2015-02-03 DIAGNOSIS — C719 Malignant neoplasm of brain, unspecified: Secondary | ICD-10-CM

## 2015-02-03 HISTORY — DX: Malignant neoplasm of brain, unspecified: C71.9

## 2015-05-08 DIAGNOSIS — J069 Acute upper respiratory infection, unspecified: Secondary | ICD-10-CM | POA: Diagnosis not present

## 2015-05-28 DIAGNOSIS — L570 Actinic keratosis: Secondary | ICD-10-CM | POA: Diagnosis not present

## 2015-05-28 DIAGNOSIS — D225 Melanocytic nevi of trunk: Secondary | ICD-10-CM | POA: Diagnosis not present

## 2015-05-28 DIAGNOSIS — L739 Follicular disorder, unspecified: Secondary | ICD-10-CM | POA: Diagnosis not present

## 2015-05-28 DIAGNOSIS — Z85820 Personal history of malignant melanoma of skin: Secondary | ICD-10-CM | POA: Diagnosis not present

## 2015-05-28 DIAGNOSIS — L812 Freckles: Secondary | ICD-10-CM | POA: Diagnosis not present

## 2015-05-28 DIAGNOSIS — D1801 Hemangioma of skin and subcutaneous tissue: Secondary | ICD-10-CM | POA: Diagnosis not present

## 2015-11-04 DIAGNOSIS — Z23 Encounter for immunization: Secondary | ICD-10-CM | POA: Diagnosis not present

## 2015-12-05 DIAGNOSIS — R29898 Other symptoms and signs involving the musculoskeletal system: Secondary | ICD-10-CM | POA: Diagnosis not present

## 2015-12-05 DIAGNOSIS — Z79899 Other long term (current) drug therapy: Secondary | ICD-10-CM | POA: Diagnosis not present

## 2015-12-05 DIAGNOSIS — I629 Nontraumatic intracranial hemorrhage, unspecified: Secondary | ICD-10-CM | POA: Diagnosis not present

## 2015-12-05 DIAGNOSIS — I69192 Facial weakness following nontraumatic intracerebral hemorrhage: Secondary | ICD-10-CM | POA: Diagnosis not present

## 2015-12-05 DIAGNOSIS — I69122 Dysarthria following nontraumatic intracerebral hemorrhage: Secondary | ICD-10-CM | POA: Diagnosis not present

## 2015-12-05 DIAGNOSIS — R4781 Slurred speech: Secondary | ICD-10-CM | POA: Diagnosis not present

## 2015-12-05 DIAGNOSIS — I6789 Other cerebrovascular disease: Secondary | ICD-10-CM | POA: Diagnosis not present

## 2015-12-05 DIAGNOSIS — Z85820 Personal history of malignant melanoma of skin: Secondary | ICD-10-CM | POA: Diagnosis not present

## 2015-12-05 DIAGNOSIS — Z9884 Bariatric surgery status: Secondary | ICD-10-CM | POA: Diagnosis not present

## 2015-12-05 DIAGNOSIS — R29810 Facial weakness: Secondary | ICD-10-CM | POA: Diagnosis not present

## 2015-12-05 DIAGNOSIS — I639 Cerebral infarction, unspecified: Secondary | ICD-10-CM | POA: Diagnosis not present

## 2015-12-05 DIAGNOSIS — I619 Nontraumatic intracerebral hemorrhage, unspecified: Secondary | ICD-10-CM | POA: Diagnosis not present

## 2015-12-05 DIAGNOSIS — I10 Essential (primary) hypertension: Secondary | ICD-10-CM | POA: Diagnosis not present

## 2015-12-05 DIAGNOSIS — G8104 Flaccid hemiplegia affecting left nondominant side: Secondary | ICD-10-CM | POA: Diagnosis not present

## 2015-12-05 DIAGNOSIS — R220 Localized swelling, mass and lump, head: Secondary | ICD-10-CM | POA: Diagnosis not present

## 2015-12-05 DIAGNOSIS — M6281 Muscle weakness (generalized): Secondary | ICD-10-CM | POA: Diagnosis not present

## 2015-12-05 DIAGNOSIS — Z87891 Personal history of nicotine dependence: Secondary | ICD-10-CM | POA: Diagnosis not present

## 2015-12-05 DIAGNOSIS — G9389 Other specified disorders of brain: Secondary | ICD-10-CM | POA: Diagnosis not present

## 2015-12-05 DIAGNOSIS — I610 Nontraumatic intracerebral hemorrhage in hemisphere, subcortical: Secondary | ICD-10-CM | POA: Diagnosis not present

## 2015-12-05 DIAGNOSIS — N400 Enlarged prostate without lower urinary tract symptoms: Secondary | ICD-10-CM | POA: Diagnosis not present

## 2015-12-06 DIAGNOSIS — R569 Unspecified convulsions: Secondary | ICD-10-CM | POA: Diagnosis not present

## 2015-12-06 DIAGNOSIS — R900 Intracranial space-occupying lesion found on diagnostic imaging of central nervous system: Secondary | ICD-10-CM | POA: Diagnosis not present

## 2015-12-06 DIAGNOSIS — R109 Unspecified abdominal pain: Secondary | ICD-10-CM | POA: Diagnosis not present

## 2015-12-06 DIAGNOSIS — Z9884 Bariatric surgery status: Secondary | ICD-10-CM | POA: Diagnosis not present

## 2015-12-06 DIAGNOSIS — C719 Malignant neoplasm of brain, unspecified: Secondary | ICD-10-CM | POA: Diagnosis not present

## 2015-12-06 DIAGNOSIS — I313 Pericardial effusion (noninflammatory): Secondary | ICD-10-CM | POA: Diagnosis not present

## 2015-12-06 DIAGNOSIS — Z452 Encounter for adjustment and management of vascular access device: Secondary | ICD-10-CM | POA: Diagnosis not present

## 2015-12-06 DIAGNOSIS — I10 Essential (primary) hypertension: Secondary | ICD-10-CM | POA: Diagnosis not present

## 2015-12-06 DIAGNOSIS — Z9889 Other specified postprocedural states: Secondary | ICD-10-CM | POA: Diagnosis not present

## 2015-12-06 DIAGNOSIS — K838 Other specified diseases of biliary tract: Secondary | ICD-10-CM | POA: Diagnosis not present

## 2015-12-06 DIAGNOSIS — I609 Nontraumatic subarachnoid hemorrhage, unspecified: Secondary | ICD-10-CM | POA: Diagnosis not present

## 2015-12-06 DIAGNOSIS — I270 Primary pulmonary hypertension: Secondary | ICD-10-CM | POA: Diagnosis not present

## 2015-12-06 DIAGNOSIS — Z85820 Personal history of malignant melanoma of skin: Secondary | ICD-10-CM | POA: Diagnosis not present

## 2015-12-06 DIAGNOSIS — K862 Cyst of pancreas: Secondary | ICD-10-CM | POA: Diagnosis not present

## 2015-12-06 DIAGNOSIS — C7931 Secondary malignant neoplasm of brain: Secondary | ICD-10-CM | POA: Diagnosis not present

## 2015-12-06 DIAGNOSIS — C439 Malignant melanoma of skin, unspecified: Secondary | ICD-10-CM | POA: Diagnosis not present

## 2015-12-06 DIAGNOSIS — Z87891 Personal history of nicotine dependence: Secondary | ICD-10-CM | POA: Diagnosis not present

## 2015-12-06 DIAGNOSIS — K219 Gastro-esophageal reflux disease without esophagitis: Secondary | ICD-10-CM | POA: Diagnosis present

## 2015-12-06 DIAGNOSIS — Z01810 Encounter for preprocedural cardiovascular examination: Secondary | ICD-10-CM | POA: Diagnosis not present

## 2015-12-06 DIAGNOSIS — D496 Neoplasm of unspecified behavior of brain: Secondary | ICD-10-CM | POA: Diagnosis not present

## 2015-12-06 DIAGNOSIS — R918 Other nonspecific abnormal finding of lung field: Secondary | ICD-10-CM | POA: Diagnosis not present

## 2015-12-06 DIAGNOSIS — D378 Neoplasm of uncertain behavior of other specified digestive organs: Secondary | ICD-10-CM | POA: Diagnosis not present

## 2015-12-06 DIAGNOSIS — K5730 Diverticulosis of large intestine without perforation or abscess without bleeding: Secondary | ICD-10-CM | POA: Diagnosis not present

## 2015-12-06 DIAGNOSIS — R0602 Shortness of breath: Secondary | ICD-10-CM | POA: Diagnosis not present

## 2015-12-06 DIAGNOSIS — R1013 Epigastric pain: Secondary | ICD-10-CM | POA: Diagnosis not present

## 2015-12-06 DIAGNOSIS — K8689 Other specified diseases of pancreas: Secondary | ICD-10-CM | POA: Diagnosis present

## 2015-12-06 DIAGNOSIS — G9389 Other specified disorders of brain: Secondary | ICD-10-CM | POA: Diagnosis not present

## 2015-12-06 DIAGNOSIS — J95821 Acute postprocedural respiratory failure: Secondary | ICD-10-CM | POA: Diagnosis not present

## 2015-12-06 DIAGNOSIS — Z951 Presence of aortocoronary bypass graft: Secondary | ICD-10-CM | POA: Diagnosis not present

## 2015-12-06 DIAGNOSIS — Z01818 Encounter for other preprocedural examination: Secondary | ICD-10-CM | POA: Diagnosis not present

## 2015-12-06 DIAGNOSIS — R911 Solitary pulmonary nodule: Secondary | ICD-10-CM | POA: Diagnosis not present

## 2015-12-06 DIAGNOSIS — Z48811 Encounter for surgical aftercare following surgery on the nervous system: Secondary | ICD-10-CM | POA: Diagnosis not present

## 2015-12-06 DIAGNOSIS — G936 Cerebral edema: Secondary | ICD-10-CM | POA: Diagnosis present

## 2015-12-06 DIAGNOSIS — D432 Neoplasm of uncertain behavior of brain, unspecified: Secondary | ICD-10-CM | POA: Diagnosis not present

## 2015-12-06 DIAGNOSIS — I771 Stricture of artery: Secondary | ICD-10-CM | POA: Diagnosis not present

## 2015-12-06 DIAGNOSIS — I6789 Other cerebrovascular disease: Secondary | ICD-10-CM | POA: Diagnosis not present

## 2015-12-06 DIAGNOSIS — N400 Enlarged prostate without lower urinary tract symptoms: Secondary | ICD-10-CM | POA: Diagnosis not present

## 2015-12-06 DIAGNOSIS — Z96611 Presence of right artificial shoulder joint: Secondary | ICD-10-CM | POA: Diagnosis present

## 2015-12-11 DIAGNOSIS — G936 Cerebral edema: Secondary | ICD-10-CM | POA: Diagnosis not present

## 2015-12-11 DIAGNOSIS — Z48811 Encounter for surgical aftercare following surgery on the nervous system: Secondary | ICD-10-CM | POA: Diagnosis not present

## 2015-12-11 HISTORY — PX: CRANIOTOMY FOR TUMOR: SUR345

## 2015-12-12 DIAGNOSIS — Z48811 Encounter for surgical aftercare following surgery on the nervous system: Secondary | ICD-10-CM | POA: Diagnosis not present

## 2015-12-12 DIAGNOSIS — G936 Cerebral edema: Secondary | ICD-10-CM | POA: Diagnosis not present

## 2015-12-19 ENCOUNTER — Other Ambulatory Visit: Payer: Self-pay | Admitting: Internal Medicine

## 2015-12-20 DIAGNOSIS — C4359 Malignant melanoma of other part of trunk: Secondary | ICD-10-CM | POA: Diagnosis not present

## 2015-12-31 DIAGNOSIS — C779 Secondary and unspecified malignant neoplasm of lymph node, unspecified: Secondary | ICD-10-CM | POA: Insufficient documentation

## 2015-12-31 DIAGNOSIS — C7931 Secondary malignant neoplasm of brain: Secondary | ICD-10-CM | POA: Insufficient documentation

## 2015-12-31 DIAGNOSIS — C439 Malignant melanoma of skin, unspecified: Secondary | ICD-10-CM | POA: Insufficient documentation

## 2016-01-01 DIAGNOSIS — C7931 Secondary malignant neoplasm of brain: Secondary | ICD-10-CM | POA: Diagnosis not present

## 2016-01-06 ENCOUNTER — Encounter: Payer: Self-pay | Admitting: Internal Medicine

## 2016-01-06 ENCOUNTER — Ambulatory Visit (INDEPENDENT_AMBULATORY_CARE_PROVIDER_SITE_OTHER): Payer: Medicare Other | Admitting: Internal Medicine

## 2016-01-06 VITALS — BP 120/88 | HR 74 | Ht 72.0 in | Wt 200.0 lb

## 2016-01-06 DIAGNOSIS — K219 Gastro-esophageal reflux disease without esophagitis: Secondary | ICD-10-CM | POA: Diagnosis not present

## 2016-01-06 DIAGNOSIS — E538 Deficiency of other specified B group vitamins: Secondary | ICD-10-CM | POA: Diagnosis not present

## 2016-01-06 DIAGNOSIS — K862 Cyst of pancreas: Secondary | ICD-10-CM

## 2016-01-06 DIAGNOSIS — I10 Essential (primary) hypertension: Secondary | ICD-10-CM

## 2016-01-06 DIAGNOSIS — C7931 Secondary malignant neoplasm of brain: Secondary | ICD-10-CM

## 2016-01-06 DIAGNOSIS — N400 Enlarged prostate without lower urinary tract symptoms: Secondary | ICD-10-CM

## 2016-01-06 DIAGNOSIS — Z9884 Bariatric surgery status: Secondary | ICD-10-CM | POA: Insufficient documentation

## 2016-01-06 MED ORDER — LISINOPRIL 5 MG PO TABS: ORAL_TABLET | ORAL | 3 refills | Status: DC

## 2016-01-06 NOTE — Patient Instructions (Signed)
  It was nice to meet you.  All other Health Maintenance issues reviewed.   All recommended immunizations and age-appropriate screenings are up-to-date or discussed.  No immunizations administered today.   Medications reviewed and updated.  Changes include stopping your BP medications.  We will start lisinopril 5 mg daily as needed to elevated BP > 140/90.  Your prescription(s) have been submitted to your pharmacy. Please take as directed and contact our office if you believe you are having problem(s) with the medication(s).   Please followup in one year, sooner if needed

## 2016-01-06 NOTE — Assessment & Plan Note (Signed)
Will see Dr Hilarie Fredrickson for further evaluation in January

## 2016-01-06 NOTE — Assessment & Plan Note (Signed)
Taking flomax as needed when he has symptoms

## 2016-01-06 NOTE — Progress Notes (Signed)
Subjective:    Patient ID: Maxwell Fox, male    DOB: 1945/11/13, 70 y.o.   MRN: [id number]  HPI He is here to establish with a new pcp.     Hypertension: He is no longer taking his medication - since being discharged his BP has been on the low side. He is compliant with a low sodium diet.  He denies chest pain, palpitations, edema, shortness of breath and regular headaches. He is not exercising regularly due to fatigue.  He does monitor his blood pressure at home daily -114/?    108/82, 141/?.  He does get a little lightheadedness with the lower BP's.    Metastatic melanoma:  He was diagnosed with melanoma 6 years ago. It was resected from his back and his b/l axilla LN were negative.  He was diagnosed with metastatic disease last month when vacationing in [place].  He had a severe HA and LOC.  He has brain mets and had surgery in [place] to remove a lesion.  He will return for further gamma knife surgery. He has several mets elsewhere.  He will be starting immunotherapy with oncology at [facility]. He is still fatigued since his hospitalization, but that is improving.    GERD:  He is taking his medication daily as prescribed.  He denies any GERD symptoms and feels his GERD is well controlled.   A CT scan in [place] shows a pancreatic cyst and he will be seeing Dr Raquel James for further evaluation.   Medications and allergies reviewed with patient and updated if appropriate.  Patient Active Problem List   Diagnosis Date Noted  . GERD (gastroesophageal reflux disease) 11/01/2014  . Obesity   . Iron deficiency anemia   . B12 nutritional deficiency   . Hypertension   . BPH (benign prostatic hypertrophy)   . Melanoma [facility])     Current Outpatient Prescriptions on File Prior to Visit  Medication Sig Dispense Refill  . amLODipine (NORVASC) 5 MG tablet Take 1 tablet (5 mg total) by mouth daily. 30 tablet 11  . calcium gluconate 500 MG tablet Take 1 tablet (500 mg total) by mouth 2 (two) times daily.    .  Cholecalciferol (VITAMIN D) 2000 UNITS tablet Take 1 tablet (2,000 Units total) by mouth daily. 30 tablet 11  . lisinopril (PRINIVIL,ZESTRIL) 20 MG tablet Take 1 tablet (20 mg total) by mouth daily. Must keep 01/06/16 w/new provider for future refills (Patient taking differently: Take 20 mg by mouth 2 (two) times daily. Must keep 01/06/16 w/new provider for future refills) 30 tablet 0  . metoprolol succinate (TOPROL-XL) 100 MG 24 hr tablet Take 1 tablet (100 mg total) by mouth daily. Take with or immediately following a meal. 90 tablet 3  . Multiple Vitamins-Minerals (MULTIVITAMIN) tablet Take 1 tablet by mouth daily.    . ranitidine (ZANTAC) 150 MG tablet Take 1 tablet (150 mg total) by mouth at bedtime. 90 tablet 1  . tamsulosin (FLOMAX) 0.4 MG CAPS capsule Take 1 capsule (0.4 mg total) by mouth daily. 90 capsule 1   No current facility-administered medications on file prior to visit.     Past Medical History:  Diagnosis Date  . B12 nutritional deficiency    malabsorbtion  . BPH (benign prostatic hypertrophy)   . Hypertension   . Iron deficiency anemia    malabsorption related (s/p gastric bypass)  . Melanoma ([place])   . Obesity    s/p gastric bypass 2000, start 355#    Past  Surgical History:  Procedure Laterality Date  . MOHS SURGERY  2012   melanoma, mid back, [place]  . ROUX-EN-Y PROCEDURE  2000  . TOTAL SHOULDER REPLACEMENT Right 2011   [place]    Social History   Social History  . Marital status: Married    Spouse name: N/A  . Number of children: N/A  . Years of education: N/A   Social History Main Topics  . Smoking status: Former Smoker    Quit date: 02/03/1963  . Smokeless tobacco: None  . Alcohol use None     Comment: wine 2x/week  . Drug use: Unknown  . Sexual activity: Not Asked   Other Topics Concern  . 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 Hypertension Father   . Macular degeneration Mother   . Breast cancer Sister   . Uterine cancer Sister   . Stomach cancer Sister     Review of Systems  Constitutional: Negative for chills and fever.  Eyes: Negative for visual disturbance.  Respiratory: Negative for cough, shortness of breath and wheezing.   Cardiovascular: Negative for chest pain, palpitations and leg swelling.  Gastrointestinal: Negative for abdominal pain and constipation.  Neurological: Positive for light-headedness. Negative for dizziness and headaches.  Psychiatric/Behavioral: Positive for sleep disturbance. Negative for dysphoric mood. The patient is not nervous/anxious.        Objective:   Vitals:   01/06/16 1352  BP: 120/88  Pulse: 74   Filed Weights   01/06/16 1352  Weight: 200 lb (90.7 kg)   Body mass index is 27.12 kg/m.   Physical Exam Constitutional: Appears well-developed and well-nourished. No distress.  HENT:  Head: Normocephalic and atraumatic.  Neck: Neck supple. No tracheal deviation present. No thyromegaly present.  No cervical lymphadenopathy Cardiovascular: Normal rate, regular rhythm and normal heart sounds.   No murmur heard. No carotid bruit .  No edema Pulmonary/Chest: Effort normal and breath sounds normal. No respiratory distress. No has no wheezes. No rales.  Abdomen: soft, non tender, non distended Skin: Skin is warm and dry. Not diaphoretic.  Psychiatric: Normal mood and affect. Behavior is normal.         Assessment & Plan:   See Problem List for Assessment and Plan of chronic medical problems.  Follow up annually, sooner if needed

## 2016-01-06 NOTE — Assessment & Plan Note (Signed)
BP controlled off medication ( stopped metoprolol, amlodipine and lisinopril) He will continue to check daily Will take lisinopril 5 mg daily only if needed - if BP > 140/90 Will titrate medication if needed

## 2016-01-06 NOTE — Assessment & Plan Note (Addendum)
Oncologist - [facility] - Dr Raynelle Chary He is traveling back to [place] for gamma-knife to the resection bed and additional brain metastasis.  Will start immunotherapy with oncology

## 2016-01-06 NOTE — Assessment & Plan Note (Signed)
Taking a special MVI daily

## 2016-01-06 NOTE — Assessment & Plan Note (Signed)
GERD controlled Continue daily medication  

## 2016-01-16 DIAGNOSIS — C439 Malignant melanoma of skin, unspecified: Secondary | ICD-10-CM | POA: Diagnosis not present

## 2016-01-16 DIAGNOSIS — C713 Malignant neoplasm of parietal lobe: Secondary | ICD-10-CM | POA: Diagnosis not present

## 2016-01-16 DIAGNOSIS — C7931 Secondary malignant neoplasm of brain: Secondary | ICD-10-CM | POA: Diagnosis not present

## 2016-01-16 DIAGNOSIS — D496 Neoplasm of unspecified behavior of brain: Secondary | ICD-10-CM | POA: Diagnosis not present

## 2016-01-16 DIAGNOSIS — I10 Essential (primary) hypertension: Secondary | ICD-10-CM | POA: Diagnosis not present

## 2016-01-23 DIAGNOSIS — C7931 Secondary malignant neoplasm of brain: Secondary | ICD-10-CM | POA: Diagnosis not present

## 2016-01-23 DIAGNOSIS — C799 Secondary malignant neoplasm of unspecified site: Secondary | ICD-10-CM | POA: Diagnosis not present

## 2016-01-30 ENCOUNTER — Encounter: Payer: Self-pay | Admitting: *Deleted

## 2016-02-01 DIAGNOSIS — C439 Malignant melanoma of skin, unspecified: Secondary | ICD-10-CM | POA: Diagnosis not present

## 2016-02-01 DIAGNOSIS — C7931 Secondary malignant neoplasm of brain: Secondary | ICD-10-CM | POA: Diagnosis not present

## 2016-02-01 DIAGNOSIS — R911 Solitary pulmonary nodule: Secondary | ICD-10-CM | POA: Diagnosis not present

## 2016-02-06 DIAGNOSIS — Z5111 Encounter for antineoplastic chemotherapy: Secondary | ICD-10-CM | POA: Diagnosis not present

## 2016-02-06 DIAGNOSIS — Z5112 Encounter for antineoplastic immunotherapy: Secondary | ICD-10-CM | POA: Diagnosis not present

## 2016-02-06 DIAGNOSIS — C799 Secondary malignant neoplasm of unspecified site: Secondary | ICD-10-CM | POA: Diagnosis not present

## 2016-02-06 DIAGNOSIS — Z923 Personal history of irradiation: Secondary | ICD-10-CM | POA: Diagnosis not present

## 2016-02-06 DIAGNOSIS — Z85820 Personal history of malignant melanoma of skin: Secondary | ICD-10-CM | POA: Diagnosis not present

## 2016-02-06 DIAGNOSIS — C4362 Malignant melanoma of left upper limb, including shoulder: Secondary | ICD-10-CM | POA: Diagnosis not present

## 2016-02-06 DIAGNOSIS — C7931 Secondary malignant neoplasm of brain: Secondary | ICD-10-CM | POA: Diagnosis not present

## 2016-02-06 DIAGNOSIS — Z79899 Other long term (current) drug therapy: Secondary | ICD-10-CM | POA: Diagnosis not present

## 2016-02-10 DIAGNOSIS — H5203 Hypermetropia, bilateral: Secondary | ICD-10-CM | POA: Diagnosis not present

## 2016-02-10 DIAGNOSIS — H2513 Age-related nuclear cataract, bilateral: Secondary | ICD-10-CM | POA: Diagnosis not present

## 2016-02-27 DIAGNOSIS — L853 Xerosis cutis: Secondary | ICD-10-CM | POA: Diagnosis not present

## 2016-02-27 DIAGNOSIS — Z87891 Personal history of nicotine dependence: Secondary | ICD-10-CM | POA: Diagnosis not present

## 2016-02-27 DIAGNOSIS — Z5111 Encounter for antineoplastic chemotherapy: Secondary | ICD-10-CM | POA: Diagnosis not present

## 2016-02-27 DIAGNOSIS — L299 Pruritus, unspecified: Secondary | ICD-10-CM | POA: Diagnosis not present

## 2016-02-27 DIAGNOSIS — C4359 Malignant melanoma of other part of trunk: Secondary | ICD-10-CM | POA: Diagnosis not present

## 2016-02-27 DIAGNOSIS — Z5112 Encounter for antineoplastic immunotherapy: Secondary | ICD-10-CM | POA: Diagnosis not present

## 2016-02-27 DIAGNOSIS — Z9889 Other specified postprocedural states: Secondary | ICD-10-CM | POA: Diagnosis not present

## 2016-02-27 DIAGNOSIS — C7931 Secondary malignant neoplasm of brain: Secondary | ICD-10-CM | POA: Diagnosis not present

## 2016-02-27 DIAGNOSIS — C799 Secondary malignant neoplasm of unspecified site: Secondary | ICD-10-CM | POA: Diagnosis not present

## 2016-02-28 ENCOUNTER — Ambulatory Visit (INDEPENDENT_AMBULATORY_CARE_PROVIDER_SITE_OTHER): Payer: Medicare Other | Admitting: Internal Medicine

## 2016-02-28 ENCOUNTER — Encounter: Payer: Self-pay | Admitting: Internal Medicine

## 2016-02-28 VITALS — BP 126/70 | HR 74 | Ht 72.0 in | Wt 215.5 lb

## 2016-02-28 DIAGNOSIS — C799 Secondary malignant neoplasm of unspecified site: Secondary | ICD-10-CM | POA: Diagnosis not present

## 2016-02-28 DIAGNOSIS — C439 Malignant melanoma of skin, unspecified: Secondary | ICD-10-CM

## 2016-02-28 DIAGNOSIS — K862 Cyst of pancreas: Secondary | ICD-10-CM

## 2016-02-28 NOTE — Patient Instructions (Signed)
We will contact you regarding follow up after speaking with Dr Ardis Hughs.  If you are age 71 or older, your body mass index should be between 23-30. Your Body mass index is 29.23 kg/m. If this is out of the aforementioned range listed, please consider follow up with your Primary Care Provider.  If you are age 7 or younger, your body mass index should be between 19-25. Your Body mass index is 29.23 kg/m. If this is out of the aformentioned range listed, please consider follow up with your Primary Care Provider.

## 2016-02-28 NOTE — Progress Notes (Addendum)
Patient ID: Maxwell Fox, male   DOB: 1945/04/07, 71 y.o.   MRN: [id number] HPI: Maxwell Fox is a 71 year old male with PMH of metastatic melanoma currently undergoing chemotherapy at [facility], hypertension, history of iron deficiency anemia, obesity status post Roux-en-Y gastric bypass in 2004, hypertension who is seen in consultation at the request of Dr. Brigitte Pulse and Dr. Quay Burow for evaluation of pancreatic cystic neoplasm. He is here today with his wife  He is currently undergoing treatment for metastatic melanoma. Melanoma initially diagnosed in 2011 excisional biopsy with node-negative disease at that time. Recurrence in the brain in November 2017 while traveling in [place]. Underwent craniotomy with resection and gamma knife therapy.   During this time imaging studies performed to evaluate metastatic melanoma. In November 2011 CT of the abdomen and pelvis showed pancreatic head cystic lesion with septation. This was again seen on follow-up CT scan on 02/01/2016. Of note a PET scan on 12/20/2015 again showed the cystic lesion in the pancreatic head without suspicious uptake.  He denies abdominal complaint. No abdominal pain. No nausea or vomiting. No history pancreatitis. No history of jaundice. Good appetite. No weight loss other than what he lost years ago after gastric bypass. No early satiety. Bowel movements regular without blood or melena.  No family history of GI tract malignancy.  He reports prior colonoscopy 5 years ago. He denies a history of colon polyps and was told to repeat screening in 10 years.  Very remote tobacco use, none recently.  He is status post cholecystectomy which was performed at the time of his open Roux-en-Y surgery in 2004   Past Medical History:  Diagnosis Date  . B12 nutritional deficiency    malabsorbtion  . BPH (benign prostatic hypertrophy)   . Diverticulosis   . Hypertension   . Iron deficiency anemia    malabsorption related (s/p gastric bypass)  .  Melanoma ([place])   . Obesity    s/p gastric bypass 2000, start 355#  . Pancreatic cyst   . Pulmonary nodules     Past Surgical History:  Procedure Laterality Date  . CRANIOTOMY FOR TUMOR  12/11/2015   metastatic melanoma  . MOHS SURGERY  2012   melanoma, mid back, [place]  . ROUX-EN-Y PROCEDURE  2000  . TOTAL SHOULDER REPLACEMENT Right 2011   [place]    Outpatient Medications Prior to Visit  Medication Sig Dispense Refill  . calcium gluconate 500 MG tablet Take 1 tablet (500 mg total) by mouth 2 (two) times daily.    . Cholecalciferol (VITAMIN D) 2000 UNITS tablet Take 1 tablet (2,000 Units total) by mouth daily. 30 tablet 11  . lisinopril (PRINIVIL,ZESTRIL) 5 MG tablet Take one tablet daily as needed for BP > 140/90 (Patient taking differently: Take 20 mg by mouth 2 (two) times daily. Take one tablet daily as needed for BP > 140/90) 90 tablet 3  . Multiple Vitamins-Minerals (MULTIVITAMIN) tablet Take 1 tablet by mouth daily.    Marland Kitchen omeprazole (PRILOSEC) 20 MG capsule Take 20 mg by mouth daily.    . ranitidine (ZANTAC) 150 MG tablet Take 1 tablet (150 mg total) by mouth at bedtime. 90 tablet 1  . tamsulosin (FLOMAX) 0.4 MG CAPS capsule Take 1 capsule (0.4 mg total) by mouth daily. 90 capsule 1   No facility-administered medications prior to visit.     No Known Allergies  Family History  Problem Relation Age of Onset  . Hypertension Father   . Macular degeneration Mother   .  Melanoma Mother   . Melanoma Sister   . Breast cancer Sister   . Uterine cancer Sister   . Stomach cancer Sister   . Melanoma Brother     Social History  Substance Use Topics  . Smoking status: Former Smoker    Quit date: 02/03/1963  . Smokeless tobacco: Never Used  . Alcohol use Not on file     Comment: wine 2x/week    ROS: As per history of present illness, otherwise negative  BP 126/70   Pulse 74   Ht 6' (1.829 m)   Wt 215 lb 8 oz (97.8 kg)   BMI 29.23 kg/m  Constitutional:  Well-developed and well-nourished. No distress. HEENT: Normocephalic and atraumatic. Oropharynx is clear and moist. No oropharyngeal exudate. Conjunctivae are normal.  No scleral icterus. Neck: Neck supple. Trachea midline. Cardiovascular: Normal rate, regular rhythm and intact distal pulses. No M/R/G Pulmonary/chest: Effort normal and breath sounds normal. No wheezing, rales or rhonchi. Abdominal: Soft, nontender, nondistended. Bowel sounds active throughout. There are no masses palpable. No hepatosplenomegaly.Well-healed midline vertical incision at epigastrium Extremities: no clubbing, cyanosis, or edema Neurological: Alert and oriented to person place and time. Skin: Skin is warm and dry. No rashes noted. Psychiatric: Normal mood and affect. Behavior is normal.  RELEVANT LABS AND IMAGING: Labs and imaging studies reviewed via [place] from [facility] records He was seen by Dr. Raynelle Chary in Oncology at [facility] yesterday  [place] with contast -- Feb 01, 2016 Abdomen and pelvis:  Liver parenchyma appears unremarkable. Gallbladder is absent. Stable appearance of cystic lesion in the pancreatic head measuring 2.5 x 2.3 cm on image 125 of series 5. This is again multilocular in appearance. Similar appearance of cystic lesion arising from the pancreatic body measuring 2.0 x 1.4 cm on image 116 of series 5. There is no pancreatic ductal dilatation or atrophy noted. Spleen is normal. Thickened appearance of the adrenal glands with no discrete nodularity. Left renal cyst noted.  Postoperative changes of the stomach are noted. Duodenal diverticulum present. Postoperative changes of the small bowel are noted with no evidence for obstruction. Appendix is normal. Colon appears unremarkable. Prostate gland is enlarged. Mild diffuse thickening of the urinary bladder walls noted. Atherosclerotic vascular calcifications are present. No lymphadenopathy is seen. Small fat-containing ventral abdominal wall  hernia noted. Pars defects at L5 with anterolisthesis of L5 on S1. No destructive bone lesions are seen.  Impression:  1. Interval increase in size of soft tissue nodule within the subcutaneous fat of the left anterior chest wall as well as increased size of left axillary lymph node which are concerning for metastatic disease. 2. Slight interval decrease in size of left lower lobe pulmonary nodule. 3. No evidence for metastatic disease in the abdomen or pelvis.  4. Similar appearance of cystic lesions in the pancreatic head and pancreatic body which could represent IPMN.  Electronically Reviewed [id number] Terance Hart, MD Electronically Reviewed on:02/01/2016 12:34 PM  ASSESSMENT/PLAN: 71 year old male with PMH of metastatic melanoma currently undergoing chemotherapy at [facility], hypertension, history of iron deficiency anemia, obesity status post Roux-en-Y gastric bypass in 2004, hypertension who is seen in consultation at the request of Dr. Brigitte Pulse and Dr. Quay Burow for evaluation of pancreatic cystic neoplasm.  1. Cystic neoplasm in the pancreatic head and body -- these lesions are asymptomatic and given the cystic nature are unlikely to be foci of metastatic melanoma. More likely is that they are intraductal papillary mucinous neoplasms (IPMNs).  Currently they are asymptomatic and without concerning  findings such as size greater than 3 cm, enhancing cyst walls, nonenhancing mural nodules, pancreatitis or dilation of the pancreatic duct. --I recommended surveillance of these lesions by MRI pancreas protocol in 3-6 months. I will contact his oncologist to see if these can be performed at [place] at the time of other plans surveillance imaging otherwise we can perform them locally. --I will also discuss this with Dr. Ardis Hughs --We discussed EUS for FNA but this will be more difficult given his Roux-en-Y anatomy --We will contact the patient once I have discussed with Dr. Ardis Hughs and Dr. Raynelle Chary  2.  Metastatic melanoma -- undergoing treatment with Dr. Raynelle Chary at [facility]  Addendum: I called and spoke to Dr. Raynelle Chary with [facility].  We discussed the pancreatic cystic lesions as above.  She is aware of these cysts and will assume the surveillance MRIs.  A lot will depend on how his melanoma responds to current therapy. If it responds favorably to therapy, then she agrees with MRI pancreas protocol with contrast in 6 months to survey the probable IPMNs.  If melanoma responds unfavorably then the issue is moot as his life expectancy would be limited given metastatic melanoma.  [id number] A Leschber, Md [address], [place] [id number]  Dr. Doug Shaw [facility]

## 2016-03-02 ENCOUNTER — Telehealth: Payer: Self-pay | Admitting: *Deleted

## 2016-03-02 ENCOUNTER — Encounter: Payer: Self-pay | Admitting: Internal Medicine

## 2016-03-02 NOTE — Telephone Encounter (Signed)
-----   Message from Larina Bras, [place] sent at 03/02/2016  3:19 PM EST ----- ===View-only below this line===  ----- Message ----- From: Jerene Bears, MD Sent: 03/02/2016  11:09 AM To: Larina Bras, CMA Subject: Pancreatic cystic lesion surveillance imaging  Here is my addendum: Addendum: I called and spoke to Dr. Raynelle Chary with [facility].  We discussed the pancreatic cystic lesions as above.  She is aware of these cysts and will assume the surveillance MRIs.  A lot will depend on how his melanoma responds to current therapy. If it responds favorably to therapy, then she agrees with MRI pancreas protocol with contrast in 6 months to survey the probable IPMNs.  If melanoma responds unfavorably then the issue is moot as his life expectancy would be limited given metastatic melanoma.  Please let patient know, if you haven't already, that Dr. Raynelle Chary agreed to perform pancreatic MRI in 6 months at [facility].  A lot will depend on how his melanoma treatment goes Thanks JMP

## 2016-03-02 NOTE — Telephone Encounter (Signed)
I have attempted to reach patient but phone number is busy. I will attempt to call back at a later time.

## 2016-03-03 ENCOUNTER — Other Ambulatory Visit: Payer: Self-pay | Admitting: Emergency Medicine

## 2016-03-03 ENCOUNTER — Encounter: Payer: Self-pay | Admitting: Internal Medicine

## 2016-03-03 MED ORDER — LISINOPRIL 20 MG PO TABS: 20.0000 mg | ORAL_TABLET | Freq: Two times a day (BID) | ORAL | 3 refills | Status: DC

## 2016-03-03 MED ORDER — LISINOPRIL 5 MG PO TABS: 5.0000 mg | ORAL_TABLET | Freq: Two times a day (BID) | ORAL | 1 refills | Status: DC

## 2016-03-03 NOTE — Telephone Encounter (Signed)
Phone is again busy. Wife (emergency contact and ROI release) phone disconnected. Will attempt to contact again shortly.

## 2016-03-04 NOTE — Telephone Encounter (Signed)
I have spoken to patient to advise of Dr Vena Rua conversation with Dr Raynelle Chary and to advise that she has agreed to complete pancreatic MRI in 6 months at [facility]. He verbalizes understanding and thanks me for the call.

## 2016-03-19 DIAGNOSIS — Z9889 Other specified postprocedural states: Secondary | ICD-10-CM | POA: Diagnosis not present

## 2016-03-19 DIAGNOSIS — M25521 Pain in right elbow: Secondary | ICD-10-CM | POA: Diagnosis not present

## 2016-03-19 DIAGNOSIS — L853 Xerosis cutis: Secondary | ICD-10-CM | POA: Diagnosis not present

## 2016-03-19 DIAGNOSIS — C7931 Secondary malignant neoplasm of brain: Secondary | ICD-10-CM | POA: Diagnosis not present

## 2016-03-19 DIAGNOSIS — M2550 Pain in unspecified joint: Secondary | ICD-10-CM | POA: Diagnosis not present

## 2016-03-19 DIAGNOSIS — Z5112 Encounter for antineoplastic immunotherapy: Secondary | ICD-10-CM | POA: Diagnosis not present

## 2016-03-19 DIAGNOSIS — Z5111 Encounter for antineoplastic chemotherapy: Secondary | ICD-10-CM | POA: Diagnosis not present

## 2016-03-19 DIAGNOSIS — Z87891 Personal history of nicotine dependence: Secondary | ICD-10-CM | POA: Diagnosis not present

## 2016-03-19 DIAGNOSIS — L299 Pruritus, unspecified: Secondary | ICD-10-CM | POA: Diagnosis not present

## 2016-03-19 DIAGNOSIS — C4359 Malignant melanoma of other part of trunk: Secondary | ICD-10-CM | POA: Diagnosis not present

## 2016-03-19 DIAGNOSIS — R682 Dry mouth, unspecified: Secondary | ICD-10-CM | POA: Diagnosis not present

## 2016-03-19 DIAGNOSIS — R5383 Other fatigue: Secondary | ICD-10-CM | POA: Diagnosis not present

## 2016-03-19 DIAGNOSIS — C799 Secondary malignant neoplasm of unspecified site: Secondary | ICD-10-CM | POA: Diagnosis not present

## 2016-04-09 DIAGNOSIS — L853 Xerosis cutis: Secondary | ICD-10-CM | POA: Diagnosis not present

## 2016-04-09 DIAGNOSIS — R682 Dry mouth, unspecified: Secondary | ICD-10-CM | POA: Diagnosis not present

## 2016-04-09 DIAGNOSIS — R05 Cough: Secondary | ICD-10-CM | POA: Diagnosis not present

## 2016-04-09 DIAGNOSIS — K5901 Slow transit constipation: Secondary | ICD-10-CM | POA: Diagnosis not present

## 2016-04-09 DIAGNOSIS — M25521 Pain in right elbow: Secondary | ICD-10-CM | POA: Diagnosis not present

## 2016-04-09 DIAGNOSIS — L299 Pruritus, unspecified: Secondary | ICD-10-CM | POA: Diagnosis not present

## 2016-04-09 DIAGNOSIS — R5383 Other fatigue: Secondary | ICD-10-CM | POA: Diagnosis not present

## 2016-04-09 DIAGNOSIS — M2550 Pain in unspecified joint: Secondary | ICD-10-CM | POA: Diagnosis not present

## 2016-04-09 DIAGNOSIS — Z9889 Other specified postprocedural states: Secondary | ICD-10-CM | POA: Diagnosis not present

## 2016-04-09 DIAGNOSIS — K5900 Constipation, unspecified: Secondary | ICD-10-CM | POA: Diagnosis not present

## 2016-04-09 DIAGNOSIS — Z5111 Encounter for antineoplastic chemotherapy: Secondary | ICD-10-CM | POA: Diagnosis not present

## 2016-04-09 DIAGNOSIS — C7931 Secondary malignant neoplasm of brain: Secondary | ICD-10-CM | POA: Diagnosis not present

## 2016-04-09 DIAGNOSIS — Z5112 Encounter for antineoplastic immunotherapy: Secondary | ICD-10-CM | POA: Diagnosis not present

## 2016-04-09 DIAGNOSIS — C496 Malignant neoplasm of connective and soft tissue of trunk, unspecified: Secondary | ICD-10-CM | POA: Diagnosis not present

## 2016-04-09 DIAGNOSIS — Z87891 Personal history of nicotine dependence: Secondary | ICD-10-CM | POA: Diagnosis not present

## 2016-04-09 DIAGNOSIS — C799 Secondary malignant neoplasm of unspecified site: Secondary | ICD-10-CM | POA: Diagnosis not present

## 2016-04-29 DIAGNOSIS — C439 Malignant melanoma of skin, unspecified: Secondary | ICD-10-CM | POA: Diagnosis not present

## 2016-04-29 DIAGNOSIS — C4359 Malignant melanoma of other part of trunk: Secondary | ICD-10-CM | POA: Diagnosis not present

## 2016-04-29 DIAGNOSIS — R5383 Other fatigue: Secondary | ICD-10-CM | POA: Diagnosis not present

## 2016-04-29 DIAGNOSIS — C7951 Secondary malignant neoplasm of bone: Secondary | ICD-10-CM | POA: Diagnosis not present

## 2016-04-29 DIAGNOSIS — C799 Secondary malignant neoplasm of unspecified site: Secondary | ICD-10-CM | POA: Diagnosis not present

## 2016-04-29 DIAGNOSIS — M25559 Pain in unspecified hip: Secondary | ICD-10-CM | POA: Diagnosis not present

## 2016-04-29 DIAGNOSIS — R682 Dry mouth, unspecified: Secondary | ICD-10-CM | POA: Diagnosis not present

## 2016-04-29 DIAGNOSIS — C7931 Secondary malignant neoplasm of brain: Secondary | ICD-10-CM | POA: Diagnosis not present

## 2016-04-29 DIAGNOSIS — R911 Solitary pulmonary nodule: Secondary | ICD-10-CM | POA: Diagnosis not present

## 2016-04-29 DIAGNOSIS — C4362 Malignant melanoma of left upper limb, including shoulder: Secondary | ICD-10-CM | POA: Diagnosis not present

## 2016-04-29 DIAGNOSIS — R590 Localized enlarged lymph nodes: Secondary | ICD-10-CM | POA: Diagnosis not present

## 2016-04-29 DIAGNOSIS — K8689 Other specified diseases of pancreas: Secondary | ICD-10-CM | POA: Diagnosis not present

## 2016-04-29 DIAGNOSIS — L989 Disorder of the skin and subcutaneous tissue, unspecified: Secondary | ICD-10-CM | POA: Diagnosis not present

## 2016-04-29 DIAGNOSIS — Z87891 Personal history of nicotine dependence: Secondary | ICD-10-CM | POA: Diagnosis not present

## 2016-04-29 DIAGNOSIS — Z85828 Personal history of other malignant neoplasm of skin: Secondary | ICD-10-CM | POA: Diagnosis not present

## 2016-04-29 DIAGNOSIS — K869 Disease of pancreas, unspecified: Secondary | ICD-10-CM | POA: Diagnosis not present

## 2016-05-04 DIAGNOSIS — C7931 Secondary malignant neoplasm of brain: Secondary | ICD-10-CM | POA: Diagnosis not present

## 2016-05-04 DIAGNOSIS — C7951 Secondary malignant neoplasm of bone: Secondary | ICD-10-CM | POA: Diagnosis not present

## 2016-05-08 DIAGNOSIS — C439 Malignant melanoma of skin, unspecified: Secondary | ICD-10-CM | POA: Diagnosis not present

## 2016-05-08 DIAGNOSIS — C7951 Secondary malignant neoplasm of bone: Secondary | ICD-10-CM | POA: Diagnosis not present

## 2016-05-08 DIAGNOSIS — C7931 Secondary malignant neoplasm of brain: Secondary | ICD-10-CM | POA: Diagnosis not present

## 2016-05-11 DIAGNOSIS — C7951 Secondary malignant neoplasm of bone: Secondary | ICD-10-CM | POA: Diagnosis not present

## 2016-05-11 DIAGNOSIS — C7931 Secondary malignant neoplasm of brain: Secondary | ICD-10-CM | POA: Diagnosis not present

## 2016-05-12 DIAGNOSIS — C7951 Secondary malignant neoplasm of bone: Secondary | ICD-10-CM | POA: Diagnosis not present

## 2016-05-12 DIAGNOSIS — M48061 Spinal stenosis, lumbar region without neurogenic claudication: Secondary | ICD-10-CM | POA: Diagnosis not present

## 2016-05-12 DIAGNOSIS — C7931 Secondary malignant neoplasm of brain: Secondary | ICD-10-CM | POA: Diagnosis not present

## 2016-05-12 DIAGNOSIS — M545 Low back pain: Secondary | ICD-10-CM | POA: Diagnosis not present

## 2016-05-13 DIAGNOSIS — C7951 Secondary malignant neoplasm of bone: Secondary | ICD-10-CM | POA: Diagnosis not present

## 2016-05-13 DIAGNOSIS — C7931 Secondary malignant neoplasm of brain: Secondary | ICD-10-CM | POA: Diagnosis not present

## 2016-05-14 DIAGNOSIS — C7931 Secondary malignant neoplasm of brain: Secondary | ICD-10-CM | POA: Diagnosis not present

## 2016-05-14 DIAGNOSIS — C7951 Secondary malignant neoplasm of bone: Secondary | ICD-10-CM | POA: Diagnosis not present

## 2016-05-15 DIAGNOSIS — C7951 Secondary malignant neoplasm of bone: Secondary | ICD-10-CM | POA: Diagnosis not present

## 2016-05-15 DIAGNOSIS — C7931 Secondary malignant neoplasm of brain: Secondary | ICD-10-CM | POA: Diagnosis not present

## 2016-05-18 DIAGNOSIS — C7951 Secondary malignant neoplasm of bone: Secondary | ICD-10-CM | POA: Diagnosis not present

## 2016-05-18 DIAGNOSIS — C7931 Secondary malignant neoplasm of brain: Secondary | ICD-10-CM | POA: Diagnosis not present

## 2016-05-19 DIAGNOSIS — C7931 Secondary malignant neoplasm of brain: Secondary | ICD-10-CM | POA: Diagnosis not present

## 2016-05-19 DIAGNOSIS — C7951 Secondary malignant neoplasm of bone: Secondary | ICD-10-CM | POA: Diagnosis not present

## 2016-05-20 DIAGNOSIS — C7931 Secondary malignant neoplasm of brain: Secondary | ICD-10-CM | POA: Diagnosis not present

## 2016-05-20 DIAGNOSIS — C7951 Secondary malignant neoplasm of bone: Secondary | ICD-10-CM | POA: Diagnosis not present

## 2016-05-21 DIAGNOSIS — C7931 Secondary malignant neoplasm of brain: Secondary | ICD-10-CM | POA: Diagnosis not present

## 2016-05-21 DIAGNOSIS — C7951 Secondary malignant neoplasm of bone: Secondary | ICD-10-CM | POA: Diagnosis not present

## 2016-05-22 DIAGNOSIS — C7931 Secondary malignant neoplasm of brain: Secondary | ICD-10-CM | POA: Diagnosis not present

## 2016-05-22 DIAGNOSIS — C7951 Secondary malignant neoplasm of bone: Secondary | ICD-10-CM | POA: Diagnosis not present

## 2016-05-25 DIAGNOSIS — C7931 Secondary malignant neoplasm of brain: Secondary | ICD-10-CM | POA: Diagnosis not present

## 2016-05-25 DIAGNOSIS — C7951 Secondary malignant neoplasm of bone: Secondary | ICD-10-CM | POA: Diagnosis not present

## 2016-05-26 DIAGNOSIS — C7931 Secondary malignant neoplasm of brain: Secondary | ICD-10-CM | POA: Diagnosis not present

## 2016-05-26 DIAGNOSIS — C7951 Secondary malignant neoplasm of bone: Secondary | ICD-10-CM | POA: Diagnosis not present

## 2016-05-29 DIAGNOSIS — L814 Other melanin hyperpigmentation: Secondary | ICD-10-CM | POA: Diagnosis not present

## 2016-05-29 DIAGNOSIS — Z85820 Personal history of malignant melanoma of skin: Secondary | ICD-10-CM | POA: Diagnosis not present

## 2016-05-29 DIAGNOSIS — L821 Other seborrheic keratosis: Secondary | ICD-10-CM | POA: Diagnosis not present

## 2016-05-29 DIAGNOSIS — D225 Melanocytic nevi of trunk: Secondary | ICD-10-CM | POA: Diagnosis not present

## 2016-05-29 DIAGNOSIS — B360 Pityriasis versicolor: Secondary | ICD-10-CM | POA: Diagnosis not present

## 2016-05-29 DIAGNOSIS — L309 Dermatitis, unspecified: Secondary | ICD-10-CM | POA: Diagnosis not present

## 2016-06-03 DIAGNOSIS — M545 Low back pain, unspecified: Secondary | ICD-10-CM | POA: Insufficient documentation

## 2016-06-03 DIAGNOSIS — C7951 Secondary malignant neoplasm of bone: Secondary | ICD-10-CM | POA: Diagnosis not present

## 2016-06-03 DIAGNOSIS — C799 Secondary malignant neoplasm of unspecified site: Secondary | ICD-10-CM | POA: Diagnosis not present

## 2016-06-03 DIAGNOSIS — M549 Dorsalgia, unspecified: Secondary | ICD-10-CM | POA: Diagnosis not present

## 2016-06-03 DIAGNOSIS — R5383 Other fatigue: Secondary | ICD-10-CM | POA: Diagnosis not present

## 2016-06-03 DIAGNOSIS — C7931 Secondary malignant neoplasm of brain: Secondary | ICD-10-CM | POA: Diagnosis not present

## 2016-06-03 DIAGNOSIS — Z5112 Encounter for antineoplastic immunotherapy: Secondary | ICD-10-CM | POA: Diagnosis not present

## 2016-06-03 DIAGNOSIS — Z5111 Encounter for antineoplastic chemotherapy: Secondary | ICD-10-CM | POA: Diagnosis not present

## 2016-06-03 DIAGNOSIS — K869 Disease of pancreas, unspecified: Secondary | ICD-10-CM | POA: Diagnosis not present

## 2016-06-03 DIAGNOSIS — C439 Malignant melanoma of skin, unspecified: Secondary | ICD-10-CM | POA: Diagnosis not present

## 2016-06-24 DIAGNOSIS — Z923 Personal history of irradiation: Secondary | ICD-10-CM | POA: Diagnosis not present

## 2016-06-24 DIAGNOSIS — E538 Deficiency of other specified B group vitamins: Secondary | ICD-10-CM | POA: Diagnosis not present

## 2016-06-24 DIAGNOSIS — Z8619 Personal history of other infectious and parasitic diseases: Secondary | ICD-10-CM | POA: Diagnosis not present

## 2016-06-24 DIAGNOSIS — C7931 Secondary malignant neoplasm of brain: Secondary | ICD-10-CM | POA: Diagnosis not present

## 2016-06-24 DIAGNOSIS — M549 Dorsalgia, unspecified: Secondary | ICD-10-CM | POA: Diagnosis not present

## 2016-06-24 DIAGNOSIS — C439 Malignant melanoma of skin, unspecified: Secondary | ICD-10-CM | POA: Diagnosis not present

## 2016-06-24 DIAGNOSIS — Z9889 Other specified postprocedural states: Secondary | ICD-10-CM | POA: Diagnosis not present

## 2016-06-24 DIAGNOSIS — Z9884 Bariatric surgery status: Secondary | ICD-10-CM | POA: Diagnosis not present

## 2016-06-24 DIAGNOSIS — R5383 Other fatigue: Secondary | ICD-10-CM | POA: Diagnosis not present

## 2016-06-24 DIAGNOSIS — C7951 Secondary malignant neoplasm of bone: Secondary | ICD-10-CM | POA: Diagnosis not present

## 2016-06-24 DIAGNOSIS — Z87891 Personal history of nicotine dependence: Secondary | ICD-10-CM | POA: Diagnosis not present

## 2016-06-24 DIAGNOSIS — Z5112 Encounter for antineoplastic immunotherapy: Secondary | ICD-10-CM | POA: Diagnosis not present

## 2016-06-24 DIAGNOSIS — K8689 Other specified diseases of pancreas: Secondary | ICD-10-CM | POA: Diagnosis not present

## 2016-06-24 DIAGNOSIS — K140 Glossitis: Secondary | ICD-10-CM | POA: Diagnosis not present

## 2016-07-06 DIAGNOSIS — C799 Secondary malignant neoplasm of unspecified site: Secondary | ICD-10-CM | POA: Diagnosis not present

## 2016-07-06 DIAGNOSIS — C7931 Secondary malignant neoplasm of brain: Secondary | ICD-10-CM | POA: Diagnosis not present

## 2016-07-15 DIAGNOSIS — R51 Headache: Secondary | ICD-10-CM | POA: Diagnosis not present

## 2016-07-15 DIAGNOSIS — K862 Cyst of pancreas: Secondary | ICD-10-CM | POA: Diagnosis not present

## 2016-07-15 DIAGNOSIS — D509 Iron deficiency anemia, unspecified: Secondary | ICD-10-CM | POA: Diagnosis not present

## 2016-07-15 DIAGNOSIS — C434 Malignant melanoma of scalp and neck: Secondary | ICD-10-CM | POA: Diagnosis not present

## 2016-07-15 DIAGNOSIS — R5383 Other fatigue: Secondary | ICD-10-CM | POA: Diagnosis not present

## 2016-07-15 DIAGNOSIS — C799 Secondary malignant neoplasm of unspecified site: Secondary | ICD-10-CM | POA: Diagnosis not present

## 2016-07-15 DIAGNOSIS — K140 Glossitis: Secondary | ICD-10-CM | POA: Diagnosis not present

## 2016-07-15 DIAGNOSIS — M545 Low back pain: Secondary | ICD-10-CM | POA: Diagnosis not present

## 2016-07-15 DIAGNOSIS — Z9889 Other specified postprocedural states: Secondary | ICD-10-CM | POA: Diagnosis not present

## 2016-07-15 DIAGNOSIS — Z87891 Personal history of nicotine dependence: Secondary | ICD-10-CM | POA: Diagnosis not present

## 2016-07-15 DIAGNOSIS — R946 Abnormal results of thyroid function studies: Secondary | ICD-10-CM | POA: Diagnosis not present

## 2016-07-15 DIAGNOSIS — Z5112 Encounter for antineoplastic immunotherapy: Secondary | ICD-10-CM | POA: Diagnosis not present

## 2016-07-15 DIAGNOSIS — Z923 Personal history of irradiation: Secondary | ICD-10-CM | POA: Diagnosis not present

## 2016-07-15 DIAGNOSIS — C7931 Secondary malignant neoplasm of brain: Secondary | ICD-10-CM | POA: Diagnosis not present

## 2016-07-19 DIAGNOSIS — L299 Pruritus, unspecified: Secondary | ICD-10-CM | POA: Diagnosis present

## 2016-07-19 DIAGNOSIS — E038 Other specified hypothyroidism: Secondary | ICD-10-CM | POA: Diagnosis not present

## 2016-07-19 DIAGNOSIS — K5900 Constipation, unspecified: Secondary | ICD-10-CM | POA: Diagnosis present

## 2016-07-19 DIAGNOSIS — C7931 Secondary malignant neoplasm of brain: Secondary | ICD-10-CM | POA: Diagnosis not present

## 2016-07-19 DIAGNOSIS — E43 Unspecified severe protein-calorie malnutrition: Secondary | ICD-10-CM | POA: Diagnosis not present

## 2016-07-19 DIAGNOSIS — I517 Cardiomegaly: Secondary | ICD-10-CM | POA: Diagnosis not present

## 2016-07-19 DIAGNOSIS — C7951 Secondary malignant neoplasm of bone: Secondary | ICD-10-CM | POA: Diagnosis not present

## 2016-07-19 DIAGNOSIS — M4802 Spinal stenosis, cervical region: Secondary | ICD-10-CM | POA: Diagnosis not present

## 2016-07-19 DIAGNOSIS — M5136 Other intervertebral disc degeneration, lumbar region: Secondary | ICD-10-CM | POA: Diagnosis not present

## 2016-07-19 DIAGNOSIS — Z87891 Personal history of nicotine dependence: Secondary | ICD-10-CM | POA: Diagnosis not present

## 2016-07-19 DIAGNOSIS — R5381 Other malaise: Secondary | ICD-10-CM | POA: Diagnosis not present

## 2016-07-19 DIAGNOSIS — E236 Other disorders of pituitary gland: Secondary | ICD-10-CM | POA: Diagnosis not present

## 2016-07-19 DIAGNOSIS — I959 Hypotension, unspecified: Secondary | ICD-10-CM | POA: Diagnosis not present

## 2016-07-19 DIAGNOSIS — Z85820 Personal history of malignant melanoma of skin: Secondary | ICD-10-CM | POA: Diagnosis not present

## 2016-07-19 DIAGNOSIS — E2749 Other adrenocortical insufficiency: Secondary | ICD-10-CM | POA: Diagnosis not present

## 2016-07-19 DIAGNOSIS — N179 Acute kidney failure, unspecified: Secondary | ICD-10-CM | POA: Diagnosis not present

## 2016-07-19 DIAGNOSIS — I951 Orthostatic hypotension: Secondary | ICD-10-CM | POA: Diagnosis not present

## 2016-07-19 DIAGNOSIS — Z79899 Other long term (current) drug therapy: Secondary | ICD-10-CM | POA: Diagnosis not present

## 2016-07-19 DIAGNOSIS — E871 Hypo-osmolality and hyponatremia: Secondary | ICD-10-CM | POA: Diagnosis not present

## 2016-07-19 DIAGNOSIS — Z9884 Bariatric surgery status: Secondary | ICD-10-CM | POA: Diagnosis not present

## 2016-07-19 DIAGNOSIS — T451X5A Adverse effect of antineoplastic and immunosuppressive drugs, initial encounter: Secondary | ICD-10-CM | POA: Diagnosis present

## 2016-07-19 DIAGNOSIS — C439 Malignant melanoma of skin, unspecified: Secondary | ICD-10-CM | POA: Diagnosis not present

## 2016-07-19 DIAGNOSIS — R931 Abnormal findings on diagnostic imaging of heart and coronary circulation: Secondary | ICD-10-CM | POA: Diagnosis not present

## 2016-07-19 DIAGNOSIS — Z96611 Presence of right artificial shoulder joint: Secondary | ICD-10-CM | POA: Diagnosis present

## 2016-07-19 DIAGNOSIS — I10 Essential (primary) hypertension: Secondary | ICD-10-CM | POA: Diagnosis present

## 2016-07-19 DIAGNOSIS — D72829 Elevated white blood cell count, unspecified: Secondary | ICD-10-CM | POA: Diagnosis present

## 2016-07-19 DIAGNOSIS — R51 Headache: Secondary | ICD-10-CM | POA: Diagnosis not present

## 2016-07-19 DIAGNOSIS — E222 Syndrome of inappropriate secretion of antidiuretic hormone: Secondary | ICD-10-CM | POA: Diagnosis not present

## 2016-07-19 DIAGNOSIS — R000 Tachycardia, unspecified: Secondary | ICD-10-CM | POA: Diagnosis present

## 2016-07-19 DIAGNOSIS — G936 Cerebral edema: Secondary | ICD-10-CM | POA: Diagnosis not present

## 2016-07-19 DIAGNOSIS — R5383 Other fatigue: Secondary | ICD-10-CM | POA: Diagnosis not present

## 2016-07-19 DIAGNOSIS — M50221 Other cervical disc displacement at C4-C5 level: Secondary | ICD-10-CM | POA: Diagnosis not present

## 2016-07-19 DIAGNOSIS — C4359 Malignant melanoma of other part of trunk: Secondary | ICD-10-CM | POA: Diagnosis not present

## 2016-07-19 DIAGNOSIS — R682 Dry mouth, unspecified: Secondary | ICD-10-CM | POA: Diagnosis not present

## 2016-07-19 DIAGNOSIS — R531 Weakness: Secondary | ICD-10-CM | POA: Diagnosis not present

## 2016-07-19 DIAGNOSIS — D1809 Hemangioma of other sites: Secondary | ICD-10-CM | POA: Diagnosis not present

## 2016-07-19 DIAGNOSIS — R739 Hyperglycemia, unspecified: Secondary | ICD-10-CM | POA: Diagnosis not present

## 2016-07-19 DIAGNOSIS — E230 Hypopituitarism: Secondary | ICD-10-CM | POA: Diagnosis not present

## 2016-07-27 DIAGNOSIS — E2740 Unspecified adrenocortical insufficiency: Secondary | ICD-10-CM

## 2016-07-27 DIAGNOSIS — E236 Other disorders of pituitary gland: Secondary | ICD-10-CM | POA: Diagnosis not present

## 2016-07-27 DIAGNOSIS — E871 Hypo-osmolality and hyponatremia: Secondary | ICD-10-CM | POA: Diagnosis not present

## 2016-07-27 DIAGNOSIS — E038 Other specified hypothyroidism: Secondary | ICD-10-CM | POA: Diagnosis not present

## 2016-07-27 DIAGNOSIS — E273 Drug-induced adrenocortical insufficiency: Secondary | ICD-10-CM | POA: Insufficient documentation

## 2016-07-27 DIAGNOSIS — E039 Hypothyroidism, unspecified: Secondary | ICD-10-CM | POA: Insufficient documentation

## 2016-07-27 DIAGNOSIS — E230 Hypopituitarism: Secondary | ICD-10-CM | POA: Diagnosis not present

## 2016-07-27 DIAGNOSIS — C7931 Secondary malignant neoplasm of brain: Secondary | ICD-10-CM | POA: Diagnosis not present

## 2016-07-27 DIAGNOSIS — Z5111 Encounter for antineoplastic chemotherapy: Secondary | ICD-10-CM | POA: Diagnosis not present

## 2016-07-27 DIAGNOSIS — Z5112 Encounter for antineoplastic immunotherapy: Secondary | ICD-10-CM | POA: Diagnosis not present

## 2016-07-27 DIAGNOSIS — C799 Secondary malignant neoplasm of unspecified site: Secondary | ICD-10-CM | POA: Diagnosis not present

## 2016-07-31 DIAGNOSIS — E230 Hypopituitarism: Secondary | ICD-10-CM | POA: Diagnosis not present

## 2016-07-31 DIAGNOSIS — E038 Other specified hypothyroidism: Secondary | ICD-10-CM | POA: Diagnosis not present

## 2016-07-31 DIAGNOSIS — E2740 Unspecified adrenocortical insufficiency: Secondary | ICD-10-CM | POA: Diagnosis not present

## 2016-08-10 DIAGNOSIS — E2740 Unspecified adrenocortical insufficiency: Secondary | ICD-10-CM | POA: Diagnosis not present

## 2016-08-10 DIAGNOSIS — E038 Other specified hypothyroidism: Secondary | ICD-10-CM | POA: Diagnosis not present

## 2016-08-10 DIAGNOSIS — E230 Hypopituitarism: Secondary | ICD-10-CM | POA: Diagnosis not present

## 2016-08-19 DIAGNOSIS — R6889 Other general symptoms and signs: Secondary | ICD-10-CM | POA: Diagnosis not present

## 2016-08-25 DIAGNOSIS — E236 Other disorders of pituitary gland: Secondary | ICD-10-CM | POA: Diagnosis not present

## 2016-08-25 DIAGNOSIS — D497 Neoplasm of unspecified behavior of endocrine glands and other parts of nervous system: Secondary | ICD-10-CM | POA: Diagnosis not present

## 2016-08-25 DIAGNOSIS — E230 Hypopituitarism: Secondary | ICD-10-CM | POA: Diagnosis not present

## 2016-08-26 DIAGNOSIS — C439 Malignant melanoma of skin, unspecified: Secondary | ICD-10-CM | POA: Diagnosis not present

## 2016-08-26 DIAGNOSIS — C7951 Secondary malignant neoplasm of bone: Secondary | ICD-10-CM | POA: Diagnosis not present

## 2016-08-26 DIAGNOSIS — K5289 Other specified noninfective gastroenteritis and colitis: Secondary | ICD-10-CM | POA: Diagnosis not present

## 2016-08-26 DIAGNOSIS — Z9884 Bariatric surgery status: Secondary | ICD-10-CM | POA: Diagnosis not present

## 2016-08-26 DIAGNOSIS — Z87891 Personal history of nicotine dependence: Secondary | ICD-10-CM | POA: Diagnosis not present

## 2016-08-26 DIAGNOSIS — D649 Anemia, unspecified: Secondary | ICD-10-CM | POA: Diagnosis not present

## 2016-08-26 DIAGNOSIS — C7931 Secondary malignant neoplasm of brain: Secondary | ICD-10-CM | POA: Diagnosis not present

## 2016-08-26 DIAGNOSIS — E038 Other specified hypothyroidism: Secondary | ICD-10-CM | POA: Diagnosis not present

## 2016-08-26 DIAGNOSIS — C799 Secondary malignant neoplasm of unspecified site: Secondary | ICD-10-CM | POA: Diagnosis not present

## 2016-08-26 DIAGNOSIS — E236 Other disorders of pituitary gland: Secondary | ICD-10-CM | POA: Diagnosis not present

## 2016-08-26 DIAGNOSIS — E2740 Unspecified adrenocortical insufficiency: Secondary | ICD-10-CM | POA: Diagnosis not present

## 2016-08-26 DIAGNOSIS — M549 Dorsalgia, unspecified: Secondary | ICD-10-CM | POA: Diagnosis not present

## 2016-08-26 DIAGNOSIS — E230 Hypopituitarism: Secondary | ICD-10-CM | POA: Diagnosis not present

## 2016-08-26 DIAGNOSIS — R5383 Other fatigue: Secondary | ICD-10-CM | POA: Diagnosis not present

## 2016-08-26 DIAGNOSIS — K521 Toxic gastroenteritis and colitis: Secondary | ICD-10-CM | POA: Diagnosis not present

## 2016-08-26 DIAGNOSIS — R946 Abnormal results of thyroid function studies: Secondary | ICD-10-CM | POA: Diagnosis not present

## 2016-08-28 DIAGNOSIS — L821 Other seborrheic keratosis: Secondary | ICD-10-CM | POA: Diagnosis not present

## 2016-08-28 DIAGNOSIS — D239 Other benign neoplasm of skin, unspecified: Secondary | ICD-10-CM | POA: Diagnosis not present

## 2016-08-28 DIAGNOSIS — L814 Other melanin hyperpigmentation: Secondary | ICD-10-CM | POA: Diagnosis not present

## 2016-08-28 DIAGNOSIS — Z85820 Personal history of malignant melanoma of skin: Secondary | ICD-10-CM | POA: Diagnosis not present

## 2016-08-31 DIAGNOSIS — C799 Secondary malignant neoplasm of unspecified site: Secondary | ICD-10-CM | POA: Diagnosis not present

## 2016-08-31 DIAGNOSIS — R350 Frequency of micturition: Secondary | ICD-10-CM | POA: Diagnosis not present

## 2016-09-08 DIAGNOSIS — E038 Other specified hypothyroidism: Secondary | ICD-10-CM | POA: Diagnosis not present

## 2016-09-30 DIAGNOSIS — C7931 Secondary malignant neoplasm of brain: Secondary | ICD-10-CM | POA: Diagnosis not present

## 2016-09-30 DIAGNOSIS — E2740 Unspecified adrenocortical insufficiency: Secondary | ICD-10-CM | POA: Diagnosis not present

## 2016-09-30 DIAGNOSIS — C799 Secondary malignant neoplasm of unspecified site: Secondary | ICD-10-CM | POA: Diagnosis not present

## 2016-10-21 DIAGNOSIS — K5289 Other specified noninfective gastroenteritis and colitis: Secondary | ICD-10-CM | POA: Diagnosis not present

## 2016-10-21 DIAGNOSIS — K8689 Other specified diseases of pancreas: Secondary | ICD-10-CM | POA: Diagnosis not present

## 2016-10-21 DIAGNOSIS — C799 Secondary malignant neoplasm of unspecified site: Secondary | ICD-10-CM | POA: Diagnosis not present

## 2016-10-21 DIAGNOSIS — Z87891 Personal history of nicotine dependence: Secondary | ICD-10-CM | POA: Diagnosis not present

## 2016-10-21 DIAGNOSIS — R590 Localized enlarged lymph nodes: Secondary | ICD-10-CM | POA: Diagnosis not present

## 2016-10-21 DIAGNOSIS — C4359 Malignant melanoma of other part of trunk: Secondary | ICD-10-CM | POA: Diagnosis not present

## 2016-10-21 DIAGNOSIS — D649 Anemia, unspecified: Secondary | ICD-10-CM | POA: Diagnosis not present

## 2016-10-21 DIAGNOSIS — C439 Malignant melanoma of skin, unspecified: Secondary | ICD-10-CM | POA: Diagnosis not present

## 2016-10-21 DIAGNOSIS — K862 Cyst of pancreas: Secondary | ICD-10-CM | POA: Diagnosis not present

## 2016-10-21 DIAGNOSIS — E2740 Unspecified adrenocortical insufficiency: Secondary | ICD-10-CM | POA: Diagnosis not present

## 2016-10-21 DIAGNOSIS — E038 Other specified hypothyroidism: Secondary | ICD-10-CM | POA: Diagnosis not present

## 2016-10-21 DIAGNOSIS — R5383 Other fatigue: Secondary | ICD-10-CM | POA: Diagnosis not present

## 2016-10-21 DIAGNOSIS — K869 Disease of pancreas, unspecified: Secondary | ICD-10-CM | POA: Diagnosis not present

## 2016-10-21 DIAGNOSIS — C7931 Secondary malignant neoplasm of brain: Secondary | ICD-10-CM | POA: Diagnosis not present

## 2016-10-21 DIAGNOSIS — R600 Localized edema: Secondary | ICD-10-CM | POA: Diagnosis not present

## 2016-10-21 DIAGNOSIS — Z9884 Bariatric surgery status: Secondary | ICD-10-CM | POA: Diagnosis not present

## 2016-10-21 DIAGNOSIS — Z9289 Personal history of other medical treatment: Secondary | ICD-10-CM | POA: Diagnosis not present

## 2016-10-30 DIAGNOSIS — R358 Other polyuria: Secondary | ICD-10-CM | POA: Diagnosis not present

## 2016-10-30 DIAGNOSIS — R350 Frequency of micturition: Secondary | ICD-10-CM | POA: Diagnosis not present

## 2016-11-02 DIAGNOSIS — C7931 Secondary malignant neoplasm of brain: Secondary | ICD-10-CM | POA: Diagnosis not present

## 2016-11-02 DIAGNOSIS — E236 Other disorders of pituitary gland: Secondary | ICD-10-CM | POA: Diagnosis not present

## 2016-11-02 DIAGNOSIS — C7951 Secondary malignant neoplasm of bone: Secondary | ICD-10-CM | POA: Diagnosis not present

## 2016-11-02 DIAGNOSIS — Z85820 Personal history of malignant melanoma of skin: Secondary | ICD-10-CM | POA: Diagnosis not present

## 2016-11-02 DIAGNOSIS — T451X5D Adverse effect of antineoplastic and immunosuppressive drugs, subsequent encounter: Secondary | ICD-10-CM | POA: Diagnosis not present

## 2016-11-03 DIAGNOSIS — E230 Hypopituitarism: Secondary | ICD-10-CM | POA: Diagnosis not present

## 2016-11-03 DIAGNOSIS — R358 Other polyuria: Secondary | ICD-10-CM | POA: Diagnosis not present

## 2016-11-03 DIAGNOSIS — E038 Other specified hypothyroidism: Secondary | ICD-10-CM | POA: Diagnosis not present

## 2016-11-03 DIAGNOSIS — R350 Frequency of micturition: Secondary | ICD-10-CM | POA: Diagnosis not present

## 2016-11-03 DIAGNOSIS — E2740 Unspecified adrenocortical insufficiency: Secondary | ICD-10-CM | POA: Diagnosis not present

## 2016-11-03 DIAGNOSIS — R631 Polydipsia: Secondary | ICD-10-CM | POA: Diagnosis not present

## 2016-11-06 DIAGNOSIS — R631 Polydipsia: Secondary | ICD-10-CM | POA: Diagnosis not present

## 2016-11-06 DIAGNOSIS — R350 Frequency of micturition: Secondary | ICD-10-CM | POA: Diagnosis not present

## 2016-11-06 DIAGNOSIS — R358 Other polyuria: Secondary | ICD-10-CM | POA: Diagnosis not present

## 2016-11-27 DIAGNOSIS — L814 Other melanin hyperpigmentation: Secondary | ICD-10-CM | POA: Diagnosis not present

## 2016-11-27 DIAGNOSIS — D225 Melanocytic nevi of trunk: Secondary | ICD-10-CM | POA: Diagnosis not present

## 2016-11-27 DIAGNOSIS — D1801 Hemangioma of skin and subcutaneous tissue: Secondary | ICD-10-CM | POA: Diagnosis not present

## 2016-11-27 DIAGNOSIS — Z85820 Personal history of malignant melanoma of skin: Secondary | ICD-10-CM | POA: Diagnosis not present

## 2016-12-19 DIAGNOSIS — Z23 Encounter for immunization: Secondary | ICD-10-CM | POA: Diagnosis not present

## 2017-01-06 NOTE — Progress Notes (Signed)
Subjective:    Patient ID: Maxwell Fox, male    DOB: 27-Sep-1945, 71 y.o.   MRN: [id number]  HPI Here for medicare wellness exam   I have personally reviewed and have noted 1.The patient's medical and social history 2.Their use of alcohol, tobacco or illicit drugs 3.Their current medications and supplements 4.The patient's functional ability including ADL's, fall risks, home                 safety risk and hearing or visual impairment. 5.Diet and physical activities 6.Evidence for depression or mood disorders 7.Care team reviewed  - Endo - Dr Dara Lords, radiology - dr Tyrone Nine, oncology - dr Raynelle Chary    Are there smokers in your home (other than you)? No  Risk Factors Exercise:  Regular - weights, squats, walking Dietary issues discussed: well balanced - good amount of fruits/veges  Vitamin and supplement use:  Calcium, vitamin d, MVI  Opiod use:  Oxycodone for chronic back pain Side effects from medication:   none Does medications benefits outweigh risks/side effects:    Yes, 5 mg most days  Cardiac risk factors: advanced age, hypertension,  Depression Screen  Have you felt down, depressed or hopeless? No  Have you felt little interest or pleasure in doing things?  No  Activities of Daily Living In your present state of health, do you have any difficulty performing the following activities?:  Driving? No Managing money?  No Feeding yourself? No Getting from bed to chair? No Climbing a flight of stairs? No Preparing food and eating?: No Bathing or showering? No Getting dressed: No Getting to/using the toilet? No Moving around from place to place: No In the past year have you fallen or had a near fall?: No    Do you have more than one partner?  No  Hearing Difficulties: No Do you often ask people to speak up or repeat themselves? No Do you experience ringing or noises in your ears? No Do you have  difficulty understanding soft or whispered voices? No Vision:              Any change in vision:  no             Up to date with eye exam:   yes  Memory:  Do you feel that you have a problem with memory? No  Do you often misplace items? No  Do you feel safe at home?  Yes  Cognitive Testing  Alert, Orientated? Yes  Normal Appearance? Yes  Recall of three objects?  Yes  Can perform simple calculations? Yes  Displays appropriate judgment? Yes  Can read the correct time from a watch face? Yes   Advanced Directives have been discussed with the patient? Yes     Medications and allergies reviewed with patient and updated if appropriate.  Patient Active Problem List   Diagnosis Date Noted  . Adrenal insufficiency due to cancer therapy ([place]) 07/27/2016  . Acute midline low back pain without sciatica 06/03/2016  . H/O gastric bypass 01/06/2016  . Pancreatic cyst 01/06/2016  . Metastatic melanoma of brain 12/31/2015  . GERD (gastroesophageal reflux disease) 11/01/2014  . Iron deficiency anemia   . B12 nutritional deficiency   . Hypertension   . Benign prostatic hyperplasia     Current Outpatient Medications on File Prior to Visit  Medication Sig Dispense Refill  . calcium gluconate 500 MG tablet Take 1 tablet (500 mg total) by mouth 2 (two) times daily.    Marland Kitchen  Cholecalciferol (VITAMIN D) 2000 UNITS tablet Take 1 tablet (2,000 Units total) by mouth daily. 30 tablet 11  . levothyroxine (SYNTHROID, LEVOTHROID) 100 MCG tablet Take by mouth.    Marland Kitchen lisinopril (PRINIVIL,ZESTRIL) 20 MG tablet Take 1 tablet (20 mg total) by mouth 2 (two) times daily. 180 tablet 3  . Multiple Vitamins-Minerals (MULTIVITAMIN) tablet Take 1 tablet by mouth daily.    . predniSONE (DELTASONE) 5 MG tablet Take 5 mg by mouth daily with breakfast.    . ranitidine (ZANTAC) 150 MG tablet Take 1 tablet (150 mg total) by mouth at bedtime. 90 tablet 1  . tamsulosin (FLOMAX) 0.4 MG CAPS capsule Take 1 capsule (0.4 mg  total) by mouth daily. 90 capsule 1   No current facility-administered medications on file prior to visit.     Past Medical History:  Diagnosis Date  . B12 nutritional deficiency    malabsorbtion  . BPH (benign prostatic hypertrophy)   . Diverticulosis   . Hypertension   . Iron deficiency anemia    malabsorption related (s/p gastric bypass)  . Melanoma ([place])   . Obesity    s/p gastric bypass 2000, start 355#  . Pancreatic cyst   . Pulmonary nodules     Past Surgical History:  Procedure Laterality Date  . CRANIOTOMY FOR TUMOR  12/11/2015   metastatic melanoma  . MOHS SURGERY  2012   melanoma, mid back, [place]  . ROUX-EN-Y PROCEDURE  2000  . TOTAL SHOULDER REPLACEMENT Right 2011   [place]    Social History   Socioeconomic History  . Marital status: Married    Spouse name: None  . Number of children: 2  . Years of education: None  . Highest education level: None  Social Needs  . Financial resource strain: None  . Food insecurity - worry: None  . Food insecurity - inability: None  . Transportation needs - medical: None  . Transportation needs - non-medical: None  Occupational History  . None  Tobacco Use  . Smoking status: Former Smoker    Last attempt to quit: 02/03/1963    Years since quitting: 53.9  . Smokeless tobacco: Never Used  Substance and Sexual Activity  . Alcohol use: None    Comment: wine 2x/week  . Drug use: None  . Sexual activity: None  Other Topics Concern  . 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 Hypertension Father   . Macular degeneration Mother   . Melanoma Mother   . Melanoma Sister   . Breast cancer Sister   . Uterine cancer Sister   . Stomach cancer Sister   . Melanoma Brother     Review of Systems  Constitutional: Negative for chills and fever.  HENT: Negative for hearing loss and  tinnitus.   Eyes: Negative for visual disturbance.  Respiratory: Negative for cough, shortness of breath and wheezing.   Cardiovascular: Negative for chest pain, palpitations and leg swelling.  Gastrointestinal: Negative for abdominal pain, blood in stool, constipation, diarrhea and nausea.  Genitourinary: Negative for difficulty urinating, dysuria and hematuria.  Musculoskeletal: Positive for back pain. Negative for arthralgias.  Skin: Negative for color change and rash.  Neurological: Negative for dizziness, light-headedness and headaches.  Psychiatric/Behavioral: Negative for dysphoric mood. The patient is not nervous/anxious.        Objective:  Vitals:   01/07/17 0940  BP: 114/78  Pulse: 67  Resp: 16  Temp: 97.8 F (36.6 C)  SpO2: 93%   Filed Weights   01/07/17 0940  Weight: 180 lb (81.6 kg)   Body mass index is 24.41 kg/m.  Wt Readings from Last 3 Encounters:  01/07/17 180 lb (81.6 kg)  02/28/16 215 lb 8 oz (97.8 kg)  01/06/16 200 lb (90.7 kg)     Physical Exam Constitutional: He appears well-developed and well-nourished. No distress.  HENT:  Head: Normocephalic and atraumatic.  Right Ear: External ear normal.  Left Ear: External ear normal.  Mouth/Throat: Oropharynx is clear and moist.  Normal ear canals and TM b/l  Eyes: Conjunctivae and EOM are normal.  Neck: Neck supple. No tracheal deviation present. No thyromegaly present.  No carotid bruit  Cardiovascular: Normal rate, regular rhythm, normal heart sounds and intact distal pulses.   No murmur heard. Pulmonary/Chest: Effort normal and breath sounds normal. No respiratory distress. He has no wheezes. He has no rales.  Abdominal: Soft. He exhibits no distension. There is no tenderness.  Genitourinary: deferred  Musculoskeletal: He exhibits no edema.  Lymphadenopathy:   He has no cervical adenopathy.  Skin: Skin is warm and dry. He is not diaphoretic.  Psychiatric: He has a normal mood and affect. His  behavior is normal.         Assessment & Plan:   Wellness Exam: Immunizations  Tdap, flu and prevnar due, discussed shingrix Colonoscopy   Up to date  Eye exam  Up to date  Hearing loss  no Memory concerns/difficulties  none Independent of ADLs    fully Stressed the importance of regular exercise   Patient received copy of preventative screening tests/immunizations recommended for the next 5-10 years.   See Problem List for Assessment and Plan of chronic medical problems.

## 2017-01-07 ENCOUNTER — Other Ambulatory Visit (INDEPENDENT_AMBULATORY_CARE_PROVIDER_SITE_OTHER): Payer: Medicare Other

## 2017-01-07 ENCOUNTER — Encounter: Payer: Self-pay | Admitting: Internal Medicine

## 2017-01-07 ENCOUNTER — Ambulatory Visit (INDEPENDENT_AMBULATORY_CARE_PROVIDER_SITE_OTHER): Payer: Medicare Other | Admitting: Internal Medicine

## 2017-01-07 VITALS — BP 114/78 | HR 67 | Temp 97.8°F | Resp 16 | Ht 72.0 in | Wt 180.0 lb

## 2017-01-07 DIAGNOSIS — C799 Secondary malignant neoplasm of unspecified site: Secondary | ICD-10-CM | POA: Insufficient documentation

## 2017-01-07 DIAGNOSIS — Z0000 Encounter for general adult medical examination without abnormal findings: Secondary | ICD-10-CM | POA: Diagnosis not present

## 2017-01-07 DIAGNOSIS — Z9884 Bariatric surgery status: Secondary | ICD-10-CM

## 2017-01-07 DIAGNOSIS — I10 Essential (primary) hypertension: Secondary | ICD-10-CM

## 2017-01-07 DIAGNOSIS — E559 Vitamin D deficiency, unspecified: Secondary | ICD-10-CM

## 2017-01-07 DIAGNOSIS — E538 Deficiency of other specified B group vitamins: Secondary | ICD-10-CM

## 2017-01-07 DIAGNOSIS — C439 Malignant melanoma of skin, unspecified: Secondary | ICD-10-CM | POA: Insufficient documentation

## 2017-01-07 DIAGNOSIS — D509 Iron deficiency anemia, unspecified: Secondary | ICD-10-CM

## 2017-01-07 LAB — COMPREHENSIVE METABOLIC PANEL
ALT: 22 U/L (ref 0–53)
AST: 26 U/L (ref 0–37)
Albumin: 4.2 g/dL (ref 3.5–5.2)
Alkaline Phosphatase: 40 U/L (ref 39–117)
BUN: 23 mg/dL (ref 6–23)
CO2: 27 mEq/L (ref 19–32)
Calcium: 9.3 mg/dL (ref 8.4–10.5)
Chloride: 104 mEq/L (ref 96–112)
Creatinine, Ser: 1.09 mg/dL (ref 0.40–1.50)
GFR: 70.72 mL/min (ref >60.00)
Glucose, Bld: 102 mg/dL — ABNORMAL HIGH (ref 70–99)
Potassium: 4.4 mEq/L (ref 3.5–5.1)
Sodium: 140 mEq/L (ref 135–145)
Total Bilirubin: 0.4 mg/dL (ref 0.2–1.2)
Total Protein: 6.7 g/dL (ref 6.0–8.3)

## 2017-01-07 LAB — CBC WITH DIFFERENTIAL/PLATELET
Basophils Absolute: 0.1 10*3/uL (ref 0.0–0.1)
Basophils Relative: 0.9 % (ref 0.0–3.0)
Eosinophils Absolute: 0.1 10*3/uL (ref 0.0–0.7)
Eosinophils Relative: 0.6 % (ref 0.0–5.0)
HCT: 38.7 % — ABNORMAL LOW (ref 39.0–52.0)
Hemoglobin: 12.4 g/dL — ABNORMAL LOW (ref 13.0–17.0)
Lymphocytes Relative: 20.3 % (ref 12.0–46.0)
Lymphs Abs: 2 10*3/uL (ref 0.7–4.0)
MCHC: 32 g/dL (ref 30.0–36.0)
MCV: 80.1 fl (ref 78.0–100.0)
Monocytes Absolute: 0.6 10*3/uL (ref 0.1–1.0)
Monocytes Relative: 5.9 % (ref 3.0–12.0)
Neutro Abs: 7 10*3/uL (ref 1.4–7.7)
Neutrophils Relative %: 72.3 % (ref 43.0–77.0)
Platelets: 192 10*3/uL (ref 150.0–400.0)
RBC: 4.83 Mil/uL (ref 4.22–5.81)
RDW: 17.3 % — ABNORMAL HIGH (ref 11.5–15.5)
WBC: 9.7 10*3/uL (ref 4.0–10.5)

## 2017-01-07 LAB — LIPID PANEL
Cholesterol: 195 mg/dL (ref 0–200)
HDL: 73.2 mg/dL (ref >39.00)
LDL Cholesterol: 91 mg/dL (ref 0–99)
NonHDL: 121.39
Total CHOL/HDL Ratio: 3
Triglycerides: 152 mg/dL — ABNORMAL HIGH (ref 0.0–149.0)
VLDL: 30.4 mg/dL (ref 0.0–40.0)

## 2017-01-07 LAB — FERRITIN: Ferritin: 15.2 ng/mL — ABNORMAL LOW (ref 22.0–322.0)

## 2017-01-07 LAB — VITAMIN B12: Vitamin B-12: 583 pg/mL (ref 211–911)

## 2017-01-07 LAB — IRON: Iron: 43 ug/dL (ref 42–165)

## 2017-01-07 LAB — VITAMIN D 25 HYDROXY (VIT D DEFICIENCY, FRACTURES): VITD: 44.63 ng/mL (ref 30.00–100.00)

## 2017-01-07 NOTE — Assessment & Plan Note (Addendum)
Check vitamin levels, cbc, lipid panel

## 2017-01-07 NOTE — Assessment & Plan Note (Signed)
b12 level

## 2017-01-07 NOTE — Assessment & Plan Note (Addendum)
BP well controlled Current regimen effective and well tolerated Continue current medications at current doses Cmp, lipid

## 2017-01-07 NOTE — Assessment & Plan Note (Signed)
Had met to brain Treated with immunotherapy On surveillance now

## 2017-01-07 NOTE — Assessment & Plan Note (Signed)
S/p gastric bypass Iron, ferritin, cbc, b12, vitamin d level

## 2017-01-07 NOTE — Patient Instructions (Addendum)
  [name] , Thank you for taking time to come for your Medicare Wellness Visit. I appreciate your ongoing commitment to your health goals. Please review the following plan we discussed and let me know if I can assist you in the future.   These are the goals we discussed: Goals    None      This is a list of the screening recommended for you and due dates:  Health Maintenance  Topic Date Due  . Tetanus Vaccine  04/02/1964  . Pneumonia vaccines (2 of 2 - PCV13) 10/27/2014  . Colon Cancer Screening  02/02/2021  . Flu Shot  Completed  .  Hepatitis C: One time screening is recommended by Center for Disease Control  (CDC) for  adults born from [birth year] through 1965.   Completed     Test(s) ordered today. Your results will be released to [place] (or called to you) after review, usually within 72hours after test completion. If any changes need to be made, you will be notified at that same time.  All other Health Maintenance issues reviewed.   All recommended immunizations and age-appropriate screenings are up-to-date or discussed.  Flu immunization administered today.   Medications reviewed and updated.  No changes recommended at this time.   Please followup in one year

## 2017-01-14 DIAGNOSIS — M25552 Pain in left hip: Secondary | ICD-10-CM | POA: Diagnosis not present

## 2017-01-18 DIAGNOSIS — Z79899 Other long term (current) drug therapy: Secondary | ICD-10-CM | POA: Diagnosis not present

## 2017-01-18 DIAGNOSIS — C439 Malignant melanoma of skin, unspecified: Secondary | ICD-10-CM | POA: Diagnosis not present

## 2017-01-18 DIAGNOSIS — R7309 Other abnormal glucose: Secondary | ICD-10-CM | POA: Diagnosis not present

## 2017-01-18 DIAGNOSIS — Z87891 Personal history of nicotine dependence: Secondary | ICD-10-CM | POA: Diagnosis not present

## 2017-01-18 DIAGNOSIS — R5383 Other fatigue: Secondary | ICD-10-CM | POA: Diagnosis not present

## 2017-01-18 DIAGNOSIS — D649 Anemia, unspecified: Secondary | ICD-10-CM | POA: Diagnosis not present

## 2017-01-18 DIAGNOSIS — C7931 Secondary malignant neoplasm of brain: Secondary | ICD-10-CM | POA: Diagnosis not present

## 2017-01-18 DIAGNOSIS — T451X5D Adverse effect of antineoplastic and immunosuppressive drugs, subsequent encounter: Secondary | ICD-10-CM | POA: Diagnosis not present

## 2017-01-18 DIAGNOSIS — C799 Secondary malignant neoplasm of unspecified site: Secondary | ICD-10-CM | POA: Diagnosis not present

## 2017-01-18 DIAGNOSIS — R946 Abnormal results of thyroid function studies: Secondary | ICD-10-CM | POA: Diagnosis not present

## 2017-01-18 DIAGNOSIS — C7951 Secondary malignant neoplasm of bone: Secondary | ICD-10-CM | POA: Diagnosis not present

## 2017-01-18 DIAGNOSIS — E038 Other specified hypothyroidism: Secondary | ICD-10-CM | POA: Diagnosis not present

## 2017-01-18 DIAGNOSIS — K8689 Other specified diseases of pancreas: Secondary | ICD-10-CM | POA: Diagnosis not present

## 2017-01-18 DIAGNOSIS — E2740 Unspecified adrenocortical insufficiency: Secondary | ICD-10-CM | POA: Diagnosis not present

## 2017-01-18 DIAGNOSIS — Z9884 Bariatric surgery status: Secondary | ICD-10-CM | POA: Diagnosis not present

## 2017-01-18 DIAGNOSIS — E230 Hypopituitarism: Secondary | ICD-10-CM | POA: Diagnosis not present

## 2017-02-08 DIAGNOSIS — C7951 Secondary malignant neoplasm of bone: Secondary | ICD-10-CM | POA: Diagnosis not present

## 2017-02-08 DIAGNOSIS — C7931 Secondary malignant neoplasm of brain: Secondary | ICD-10-CM | POA: Diagnosis not present

## 2017-04-08 DIAGNOSIS — L814 Other melanin hyperpigmentation: Secondary | ICD-10-CM | POA: Diagnosis not present

## 2017-04-08 DIAGNOSIS — L821 Other seborrheic keratosis: Secondary | ICD-10-CM | POA: Diagnosis not present

## 2017-04-08 DIAGNOSIS — D225 Melanocytic nevi of trunk: Secondary | ICD-10-CM | POA: Diagnosis not present

## 2017-04-08 DIAGNOSIS — L905 Scar conditions and fibrosis of skin: Secondary | ICD-10-CM | POA: Diagnosis not present

## 2017-04-19 DIAGNOSIS — M545 Low back pain, unspecified: Secondary | ICD-10-CM | POA: Insufficient documentation

## 2017-04-19 DIAGNOSIS — R946 Abnormal results of thyroid function studies: Secondary | ICD-10-CM | POA: Diagnosis not present

## 2017-04-19 DIAGNOSIS — C439 Malignant melanoma of skin, unspecified: Secondary | ICD-10-CM | POA: Diagnosis not present

## 2017-04-19 DIAGNOSIS — G8929 Other chronic pain: Secondary | ICD-10-CM | POA: Diagnosis not present

## 2017-04-19 DIAGNOSIS — K862 Cyst of pancreas: Secondary | ICD-10-CM | POA: Diagnosis not present

## 2017-04-19 DIAGNOSIS — C7951 Secondary malignant neoplasm of bone: Secondary | ICD-10-CM | POA: Diagnosis not present

## 2017-04-19 DIAGNOSIS — R5383 Other fatigue: Secondary | ICD-10-CM | POA: Diagnosis not present

## 2017-04-19 DIAGNOSIS — Z9884 Bariatric surgery status: Secondary | ICD-10-CM | POA: Diagnosis not present

## 2017-04-19 DIAGNOSIS — C44599 Other specified malignant neoplasm of skin of other part of trunk: Secondary | ICD-10-CM | POA: Diagnosis not present

## 2017-04-19 DIAGNOSIS — Z87891 Personal history of nicotine dependence: Secondary | ICD-10-CM | POA: Diagnosis not present

## 2017-04-19 DIAGNOSIS — Z85820 Personal history of malignant melanoma of skin: Secondary | ICD-10-CM | POA: Diagnosis not present

## 2017-04-19 DIAGNOSIS — C799 Secondary malignant neoplasm of unspecified site: Secondary | ICD-10-CM | POA: Diagnosis not present

## 2017-04-19 DIAGNOSIS — C7931 Secondary malignant neoplasm of brain: Secondary | ICD-10-CM | POA: Diagnosis not present

## 2017-05-03 DIAGNOSIS — Z923 Personal history of irradiation: Secondary | ICD-10-CM | POA: Diagnosis not present

## 2017-05-03 DIAGNOSIS — M545 Low back pain: Secondary | ICD-10-CM | POA: Diagnosis not present

## 2017-05-03 DIAGNOSIS — Z85820 Personal history of malignant melanoma of skin: Secondary | ICD-10-CM | POA: Diagnosis not present

## 2017-05-03 DIAGNOSIS — C7931 Secondary malignant neoplasm of brain: Secondary | ICD-10-CM | POA: Diagnosis not present

## 2017-05-03 DIAGNOSIS — C439 Malignant melanoma of skin, unspecified: Secondary | ICD-10-CM | POA: Diagnosis not present

## 2017-05-03 DIAGNOSIS — Z08 Encounter for follow-up examination after completed treatment for malignant neoplasm: Secondary | ICD-10-CM | POA: Diagnosis not present

## 2017-05-03 DIAGNOSIS — C7951 Secondary malignant neoplasm of bone: Secondary | ICD-10-CM | POA: Diagnosis not present

## 2017-06-23 DIAGNOSIS — H25041 Posterior subcapsular polar age-related cataract, right eye: Secondary | ICD-10-CM | POA: Diagnosis not present

## 2017-06-23 DIAGNOSIS — H2512 Age-related nuclear cataract, left eye: Secondary | ICD-10-CM | POA: Diagnosis not present

## 2017-06-23 DIAGNOSIS — H52203 Unspecified astigmatism, bilateral: Secondary | ICD-10-CM | POA: Diagnosis not present

## 2017-07-09 DIAGNOSIS — D225 Melanocytic nevi of trunk: Secondary | ICD-10-CM | POA: Diagnosis not present

## 2017-07-09 DIAGNOSIS — L814 Other melanin hyperpigmentation: Secondary | ICD-10-CM | POA: Diagnosis not present

## 2017-07-09 DIAGNOSIS — Z85820 Personal history of malignant melanoma of skin: Secondary | ICD-10-CM | POA: Diagnosis not present

## 2017-07-09 DIAGNOSIS — L80 Vitiligo: Secondary | ICD-10-CM | POA: Diagnosis not present

## 2017-07-09 DIAGNOSIS — L821 Other seborrheic keratosis: Secondary | ICD-10-CM | POA: Diagnosis not present

## 2017-07-09 DIAGNOSIS — L819 Disorder of pigmentation, unspecified: Secondary | ICD-10-CM | POA: Diagnosis not present

## 2017-07-19 DIAGNOSIS — Z79899 Other long term (current) drug therapy: Secondary | ICD-10-CM | POA: Diagnosis not present

## 2017-07-19 DIAGNOSIS — C7931 Secondary malignant neoplasm of brain: Secondary | ICD-10-CM | POA: Diagnosis not present

## 2017-07-19 DIAGNOSIS — M545 Low back pain: Secondary | ICD-10-CM | POA: Diagnosis not present

## 2017-07-19 DIAGNOSIS — E2740 Unspecified adrenocortical insufficiency: Secondary | ICD-10-CM | POA: Diagnosis not present

## 2017-07-19 DIAGNOSIS — C439 Malignant melanoma of skin, unspecified: Secondary | ICD-10-CM | POA: Diagnosis not present

## 2017-07-19 DIAGNOSIS — C4359 Malignant melanoma of other part of trunk: Secondary | ICD-10-CM | POA: Diagnosis not present

## 2017-07-19 DIAGNOSIS — G8929 Other chronic pain: Secondary | ICD-10-CM | POA: Diagnosis not present

## 2017-07-19 DIAGNOSIS — Z9221 Personal history of antineoplastic chemotherapy: Secondary | ICD-10-CM | POA: Diagnosis not present

## 2017-07-19 DIAGNOSIS — Z5111 Encounter for antineoplastic chemotherapy: Secondary | ICD-10-CM | POA: Diagnosis not present

## 2017-07-19 DIAGNOSIS — Z87891 Personal history of nicotine dependence: Secondary | ICD-10-CM | POA: Diagnosis not present

## 2017-07-19 DIAGNOSIS — Z5112 Encounter for antineoplastic immunotherapy: Secondary | ICD-10-CM | POA: Diagnosis not present

## 2017-07-23 ENCOUNTER — Other Ambulatory Visit: Payer: Self-pay | Admitting: Internal Medicine

## 2017-08-26 DIAGNOSIS — R6889 Other general symptoms and signs: Secondary | ICD-10-CM | POA: Diagnosis not present

## 2017-08-26 DIAGNOSIS — Z9884 Bariatric surgery status: Secondary | ICD-10-CM | POA: Diagnosis not present

## 2017-08-26 DIAGNOSIS — R5383 Other fatigue: Secondary | ICD-10-CM | POA: Diagnosis not present

## 2017-08-26 DIAGNOSIS — E230 Hypopituitarism: Secondary | ICD-10-CM | POA: Diagnosis not present

## 2017-08-26 DIAGNOSIS — E2740 Unspecified adrenocortical insufficiency: Secondary | ICD-10-CM | POA: Diagnosis not present

## 2017-08-26 DIAGNOSIS — E038 Other specified hypothyroidism: Secondary | ICD-10-CM | POA: Diagnosis not present

## 2017-09-03 ENCOUNTER — Encounter: Payer: Self-pay | Admitting: Internal Medicine

## 2017-09-06 ENCOUNTER — Encounter: Payer: Self-pay | Admitting: Internal Medicine

## 2017-09-06 ENCOUNTER — Telehealth: Payer: Self-pay | Admitting: Emergency Medicine

## 2017-09-06 NOTE — Telephone Encounter (Signed)
Pt is needing order placed for iron infusion. States [facility] cancer doctor will not do this for him. He had blood work done in [place] July. Advised to hold off on sending resport as they may need it sent directly to Hematology

## 2017-09-06 NOTE — Telephone Encounter (Signed)
Copied from [place] [id number]. Topic: Inquiry >> Sep 06, 2017  3:40 PM Pricilla Handler wrote: Reason for CRM: Patient wants a call back from Dr. Quay Burow concerning patient's iron infusion. Patient states that he needs a iron infusion due to his weakness. Patient wants Dr. Quay Burow to call him asap. Patient is also a cancer patient.       Thank You!!!

## 2017-09-07 NOTE — Telephone Encounter (Signed)
Yes, please have labs sent here - last labs I see is from March 2019.  Then we can order

## 2017-09-07 NOTE — Telephone Encounter (Signed)
Per patient care center [facility] (where infusion is scheduled)---they only need an order from you (on a form I have to be faxed over to them), but they rely on pcp to review labs necessary before giving the order---do you want labs sent here for you to review?  If not, I can provide form for you to write order on and then fax over to that facility---routing to dr burns, please advise, thanks

## 2017-09-07 NOTE — Telephone Encounter (Signed)
Maxwell Fox I was told you could help arrange an iron transfusion.

## 2017-09-08 ENCOUNTER — Encounter: Payer: Self-pay | Admitting: Internal Medicine

## 2017-09-08 NOTE — Telephone Encounter (Signed)
Pt calling about the iron infusion he has labs done at [facility] on July 25th pt will be faxing that over now at 10:05am please give pt a call back today about iron infusion

## 2017-09-08 NOTE — Telephone Encounter (Signed)
Dr burns is writing order on Iron infusion form, and completed form will be faxed over to patient care center, appt coordinated with patient thru that facility---see tamara if any further questions

## 2017-09-14 ENCOUNTER — Ambulatory Visit (HOSPITAL_COMMUNITY)
Admission: RE | Admit: 2017-09-14 | Discharge: 2017-09-14 | Disposition: A | Discharge status: Home / Self Care | Payer: Medicare Other | Source: Ambulatory Visit | Attending: Internal Medicine | Admitting: Internal Medicine

## 2017-09-14 DIAGNOSIS — D6481 Anemia due to antineoplastic chemotherapy: Secondary | ICD-10-CM | POA: Diagnosis not present

## 2017-09-14 MED ORDER — IRON DEXTRAN 50 MG/ML IJ SOLN
975.0000 mg | Freq: Once | INTRAMUSCULAR | Status: AC
  Administered 2017-09-14: 975 mg via INTRAVENOUS
  Filled 2017-09-14: qty 19.5

## 2017-09-14 MED ORDER — SODIUM CHLORIDE 0.9 % IV SOLN
25.0000 mg | Freq: Once | INTRAVENOUS | Status: AC
  Administered 2017-09-14: 25 mg via INTRAVENOUS
  Filled 2017-09-14: qty 0.5

## 2017-09-14 MED ORDER — SODIUM CHLORIDE 0.9 % IV SOLN
INTRAVENOUS | Status: DC | PRN
  Administered 2017-09-14: 250 mL via INTRAVENOUS

## 2017-09-14 NOTE — Discharge Instructions (Signed)

## 2017-09-14 NOTE — Progress Notes (Signed)
Patient received Iron dextran via PIV for iron deficiency due to chemo. Test dose was first administered. Patient was observed for at least one hour. No adverse reactions noted. Tolerated well, vitals stable, discharge instructions given, verbalized understanding. Patient alert, oriented and ambulatory at the time of discharge.

## 2017-09-17 ENCOUNTER — Encounter: Payer: Self-pay | Admitting: Internal Medicine

## 2017-09-17 DIAGNOSIS — E611 Iron deficiency: Secondary | ICD-10-CM

## 2017-09-27 DIAGNOSIS — C7951 Secondary malignant neoplasm of bone: Secondary | ICD-10-CM | POA: Diagnosis not present

## 2017-09-27 DIAGNOSIS — Z08 Encounter for follow-up examination after completed treatment for malignant neoplasm: Secondary | ICD-10-CM | POA: Diagnosis not present

## 2017-09-27 DIAGNOSIS — C7931 Secondary malignant neoplasm of brain: Secondary | ICD-10-CM | POA: Diagnosis not present

## 2017-09-27 DIAGNOSIS — Z85841 Personal history of malignant neoplasm of brain: Secondary | ICD-10-CM | POA: Diagnosis not present

## 2017-09-27 DIAGNOSIS — Z923 Personal history of irradiation: Secondary | ICD-10-CM | POA: Diagnosis not present

## 2017-09-27 DIAGNOSIS — C439 Malignant melanoma of skin, unspecified: Secondary | ICD-10-CM | POA: Diagnosis not present

## 2017-09-27 DIAGNOSIS — Z85820 Personal history of malignant melanoma of skin: Secondary | ICD-10-CM | POA: Diagnosis not present

## 2017-10-21 ENCOUNTER — Other Ambulatory Visit (INDEPENDENT_AMBULATORY_CARE_PROVIDER_SITE_OTHER): Payer: Medicare Other

## 2017-10-21 DIAGNOSIS — E611 Iron deficiency: Secondary | ICD-10-CM | POA: Diagnosis not present

## 2017-10-21 LAB — CBC WITH DIFFERENTIAL/PLATELET
Basophils Absolute: 0 10*3/uL (ref 0.0–0.1)
Basophils Relative: 0.5 % (ref 0.0–3.0)
Eosinophils Absolute: 0 10*3/uL (ref 0.0–0.7)
Eosinophils Relative: 0.7 % (ref 0.0–5.0)
HCT: 39.2 % (ref 39.0–52.0)
Hemoglobin: 12.8 g/dL — ABNORMAL LOW (ref 13.0–17.0)
Lymphocytes Relative: 20.3 % (ref 12.0–46.0)
Lymphs Abs: 1.3 10*3/uL (ref 0.7–4.0)
MCHC: 32.5 g/dL (ref 30.0–36.0)
MCV: 83.5 fl (ref 78.0–100.0)
Monocytes Absolute: 0.4 10*3/uL (ref 0.1–1.0)
Monocytes Relative: 7.1 % (ref 3.0–12.0)
Neutro Abs: 4.4 10*3/uL (ref 1.4–7.7)
Neutrophils Relative %: 71.4 % (ref 43.0–77.0)
Platelets: 145 10*3/uL — ABNORMAL LOW (ref 150.0–400.0)
RBC: 4.7 Mil/uL (ref 4.22–5.81)
RDW: 17.7 % — ABNORMAL HIGH (ref 11.5–15.5)
WBC: 6.2 10*3/uL (ref 4.0–10.5)

## 2017-10-22 LAB — IRON,TIBC AND FERRITIN PANEL
%SAT: 17 % (calc) — ABNORMAL LOW (ref 20–48)
Ferritin: 659 ng/mL — ABNORMAL HIGH (ref 24–380)
Iron: 37 ug/dL — ABNORMAL LOW (ref 50–180)
TIBC: 220 mcg/dL (calc) — ABNORMAL LOW (ref 250–425)

## 2017-10-23 ENCOUNTER — Encounter: Payer: Self-pay | Admitting: Internal Medicine

## 2017-10-26 DIAGNOSIS — D1801 Hemangioma of skin and subcutaneous tissue: Secondary | ICD-10-CM | POA: Diagnosis not present

## 2017-10-26 DIAGNOSIS — L814 Other melanin hyperpigmentation: Secondary | ICD-10-CM | POA: Diagnosis not present

## 2017-10-26 DIAGNOSIS — Z85820 Personal history of malignant melanoma of skin: Secondary | ICD-10-CM | POA: Diagnosis not present

## 2017-10-26 DIAGNOSIS — D225 Melanocytic nevi of trunk: Secondary | ICD-10-CM | POA: Diagnosis not present

## 2017-11-08 DIAGNOSIS — Z9289 Personal history of other medical treatment: Secondary | ICD-10-CM | POA: Diagnosis not present

## 2017-11-08 DIAGNOSIS — M4317 Spondylolisthesis, lumbosacral region: Secondary | ICD-10-CM | POA: Diagnosis not present

## 2017-11-08 DIAGNOSIS — Z5111 Encounter for antineoplastic chemotherapy: Secondary | ICD-10-CM | POA: Diagnosis not present

## 2017-11-08 DIAGNOSIS — C799 Secondary malignant neoplasm of unspecified site: Secondary | ICD-10-CM | POA: Diagnosis not present

## 2017-11-08 DIAGNOSIS — Z5112 Encounter for antineoplastic immunotherapy: Secondary | ICD-10-CM | POA: Diagnosis not present

## 2017-11-08 DIAGNOSIS — K8689 Other specified diseases of pancreas: Secondary | ICD-10-CM | POA: Diagnosis not present

## 2017-11-08 DIAGNOSIS — Z79899 Other long term (current) drug therapy: Secondary | ICD-10-CM | POA: Diagnosis not present

## 2017-11-08 DIAGNOSIS — C439 Malignant melanoma of skin, unspecified: Secondary | ICD-10-CM | POA: Diagnosis not present

## 2017-11-08 DIAGNOSIS — E2740 Unspecified adrenocortical insufficiency: Secondary | ICD-10-CM | POA: Diagnosis not present

## 2017-11-08 DIAGNOSIS — G8929 Other chronic pain: Secondary | ICD-10-CM | POA: Diagnosis not present

## 2017-11-08 DIAGNOSIS — C7931 Secondary malignant neoplasm of brain: Secondary | ICD-10-CM | POA: Diagnosis not present

## 2017-11-08 DIAGNOSIS — M545 Low back pain: Secondary | ICD-10-CM | POA: Diagnosis not present

## 2017-11-08 DIAGNOSIS — Z23 Encounter for immunization: Secondary | ICD-10-CM | POA: Diagnosis not present

## 2017-11-08 DIAGNOSIS — R5383 Other fatigue: Secondary | ICD-10-CM | POA: Diagnosis not present

## 2017-11-08 DIAGNOSIS — L80 Vitiligo: Secondary | ICD-10-CM | POA: Diagnosis not present

## 2017-11-08 DIAGNOSIS — E038 Other specified hypothyroidism: Secondary | ICD-10-CM | POA: Diagnosis not present

## 2017-11-08 DIAGNOSIS — Z08 Encounter for follow-up examination after completed treatment for malignant neoplasm: Secondary | ICD-10-CM | POA: Diagnosis not present

## 2017-11-08 DIAGNOSIS — Z85820 Personal history of malignant melanoma of skin: Secondary | ICD-10-CM | POA: Diagnosis not present

## 2017-11-08 DIAGNOSIS — Z87891 Personal history of nicotine dependence: Secondary | ICD-10-CM | POA: Diagnosis not present

## 2017-11-30 ENCOUNTER — Encounter: Payer: Self-pay | Admitting: Internal Medicine

## 2018-01-09 ENCOUNTER — Other Ambulatory Visit: Payer: Self-pay | Admitting: Internal Medicine

## 2018-01-09 NOTE — Patient Instructions (Addendum)
  Prevnar  immunizations administered today.   Medications reviewed and updated.  Changes include :   Starting sertraline 50 mg at night   Please followup in 6 weeks

## 2018-01-09 NOTE — Progress Notes (Signed)
Subjective:    Patient ID: Maxwell Fox, male    DOB: 14-Feb-1945, 72 y.o.   MRN: [id number]  HPI The patient is here for follow up.  Metastatic melanoma: He follows with oncology at [facility].  His last CT scan showed no metastatic disease within the chest abdomen or pelvis.  He is on surveillance.  Hypertension: He is taking his medication daily, but currently taking 5 mg twice daily of the lisinopril.  This has been controlling his blood pressure well. He is compliant with a low sodium diet.  He denies chest pain, palpitations, edema, shortness of breath and regular headaches. He is exercising - walking.    GERD:  He is taking zantac only as needed.  He has infrequent GERD symptoms.   Adrenal insufficiency, secondary hypothyroidism: He is following with endocrine.  He is taking levothyroxine daily as prescribed.  He denies any changes in energy or weight for no reason.  He is taking the prednisone on a daily basis.  He is having some depression and a lot of anxiety.  He has been battling it for 8 months or so.  He has never been on medication, but wonders if one would help.  Medications and allergies reviewed with patient and updated if appropriate.  Patient Active Problem List   Diagnosis Date Noted  . History of antineoplastic chemotherapy 07/19/2017  . Chronic midline low back pain without sciatica 04/19/2017  . Metastatic melanoma ([place]) 01/07/2017  . Adrenal insufficiency due to cancer therapy ([place]) 07/27/2016  . Secondary hypothyroidism 07/27/2016  . H/O gastric bypass 01/06/2016  . Pancreatic cyst 01/06/2016  . Metastatic melanoma of brain 12/31/2015  . GERD (gastroesophageal reflux disease) 11/01/2014  . Iron deficiency anemia   . B12 nutritional deficiency   . Hypertension   . Benign prostatic hyperplasia     Current Outpatient Medications on File Prior to Visit  Medication Sig Dispense Refill  . calcium gluconate 500 MG tablet Take 1 tablet (500 mg total) by mouth 2  (two) times daily.    . Cholecalciferol (VITAMIN D) 2000 UNITS tablet Take 1 tablet (2,000 Units total) by mouth daily. 30 tablet 11  . levothyroxine (SYNTHROID, LEVOTHROID) 100 MCG tablet Take by mouth.    Marland Kitchen lisinopril (PRINIVIL,ZESTRIL) 20 MG tablet TAKE 1 TABLET(20 MG) BY MOUTH TWICE DAILY 180 tablet 1  . Multiple Vitamins-Minerals (MULTIVITAMIN) tablet Take 1 tablet by mouth daily.    . predniSONE (DELTASONE) 5 MG tablet Take 5 mg by mouth daily with breakfast.    . ranitidine (ZANTAC) 150 MG tablet Take 1 tablet (150 mg total) by mouth at bedtime. 90 tablet 1  . tamsulosin (FLOMAX) 0.4 MG CAPS capsule Take 1 capsule (0.4 mg total) by mouth daily. 90 capsule 1   No current facility-administered medications on file prior to visit.     Past Medical History:  Diagnosis Date  . B12 nutritional deficiency    malabsorbtion  . BPH (benign prostatic hypertrophy)   . Diverticulosis   . Hypertension   . Iron deficiency anemia    malabsorption related (s/p gastric bypass)  . Melanoma ([place])   . Obesity    s/p gastric bypass 2000, start 355#  . Pancreatic cyst   . Pulmonary nodules     Past Surgical History:  Procedure Laterality Date  . CRANIOTOMY FOR TUMOR  12/11/2015   metastatic melanoma  . MOHS SURGERY  2012   melanoma, mid back, [place]  . ROUX-EN-Y PROCEDURE  2000  . TOTAL  SHOULDER REPLACEMENT Right 2011   [place]    Social History   Socioeconomic History  . Marital status: Married    Spouse name: Not on file  . Number of children: 2  . Years of education: Not on file  . Highest education level: Not on file  Occupational History  . Not on file  Social Needs  . Financial resource strain: Not on file  . Food insecurity:    Worry: Not on file    Inability: Not on file  . Transportation needs:    Medical: Not on file    Non-medical: Not on file  Tobacco Use  . Smoking status: Former Smoker    Last attempt to quit: 02/03/1963    Years since quitting: 54.9  .  Smokeless tobacco: Never Used  Substance and Sexual Activity  . Alcohol use: Not on file    Comment: wine 2x/week  . Drug use: Not on file  . Sexual activity: Not on file  Lifestyle  . Physical activity:    Days per week: Not on file    Minutes per session: Not on file  . Stress: Not on file  Relationships  . Social connections:    Talks on phone: Not on file    Gets together: Not on file    Attends religious service: Not on file    Active member of club or organization: Not on file    Attends meetings of clubs or organizations: Not on file    Relationship status: Not on file  Other Topics Concern  . 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 Hypertension Father   . Macular degeneration Mother   . Melanoma Mother   . Melanoma Sister   . Breast cancer Sister   . Uterine cancer Sister   . Stomach cancer Sister   . Melanoma Brother     Review of Systems  Constitutional: Negative for appetite change, chills and fever.  Respiratory: Negative for cough, shortness of breath and wheezing.   Cardiovascular: Negative for chest pain, palpitations and leg swelling.  Neurological: Negative for dizziness, light-headedness and headaches.  Psychiatric/Behavioral: Positive for dysphoric mood and sleep disturbance (wakes up early - not new). The patient is nervous/anxious.        Objective:   Vitals:   01/10/18 0856  BP: 132/76  Pulse: 72  Resp: 16  Temp: 97.6 F (36.4 C)  SpO2: 99%   BP Readings from Last 3 Encounters:  01/10/18 132/76  09/14/17 128/70  01/07/17 114/78   Wt Readings from Last 3 Encounters:  01/10/18 197 lb 12.8 oz (89.7 kg)  01/07/17 180 lb (81.6 kg)  02/28/16 215 lb 8 oz (97.8 kg)   Body mass index is 26.83 kg/m.   Physical Exam    Constitutional: Appears well-developed and well-nourished. No distress.    HENT:  Head: Normocephalic and atraumatic.  Neck: Neck supple. No tracheal deviation present. No thyromegaly present.  No cervical lymphadenopathy Cardiovascular: Normal rate, regular rhythm and normal heart sounds.   No murmur heard. No carotid bruit .  No edema Pulmonary/Chest: Effort normal and breath sounds normal. No respiratory distress. No has no wheezes. No rales.  Skin: Skin is warm and dry. Not diaphoretic.  Psychiatric: Mildly anxious mood and affect. Behavior is normal.  Assessment & Plan:    See Problem List for Assessment and Plan of chronic medical problems.

## 2018-01-10 ENCOUNTER — Ambulatory Visit (INDEPENDENT_AMBULATORY_CARE_PROVIDER_SITE_OTHER): Payer: Medicare Other | Admitting: Internal Medicine

## 2018-01-10 ENCOUNTER — Encounter: Payer: Self-pay | Admitting: Internal Medicine

## 2018-01-10 VITALS — BP 132/76 | HR 72 | Temp 97.6°F | Resp 16 | Ht 72.0 in | Wt 197.8 lb

## 2018-01-10 DIAGNOSIS — C439 Malignant melanoma of skin, unspecified: Secondary | ICD-10-CM | POA: Diagnosis not present

## 2018-01-10 DIAGNOSIS — Z85841 Personal history of malignant neoplasm of brain: Secondary | ICD-10-CM | POA: Diagnosis not present

## 2018-01-10 DIAGNOSIS — F32A Depression, unspecified: Secondary | ICD-10-CM

## 2018-01-10 DIAGNOSIS — D509 Iron deficiency anemia, unspecified: Secondary | ICD-10-CM

## 2018-01-10 DIAGNOSIS — C799 Secondary malignant neoplasm of unspecified site: Secondary | ICD-10-CM

## 2018-01-10 DIAGNOSIS — E038 Other specified hypothyroidism: Secondary | ICD-10-CM | POA: Diagnosis not present

## 2018-01-10 DIAGNOSIS — I10 Essential (primary) hypertension: Secondary | ICD-10-CM

## 2018-01-10 DIAGNOSIS — K219 Gastro-esophageal reflux disease without esophagitis: Secondary | ICD-10-CM

## 2018-01-10 DIAGNOSIS — F419 Anxiety disorder, unspecified: Secondary | ICD-10-CM | POA: Diagnosis not present

## 2018-01-10 DIAGNOSIS — E2740 Unspecified adrenocortical insufficiency: Secondary | ICD-10-CM

## 2018-01-10 DIAGNOSIS — F329 Major depressive disorder, single episode, unspecified: Secondary | ICD-10-CM

## 2018-01-10 DIAGNOSIS — C7931 Secondary malignant neoplasm of brain: Secondary | ICD-10-CM | POA: Diagnosis not present

## 2018-01-10 DIAGNOSIS — Z23 Encounter for immunization: Secondary | ICD-10-CM | POA: Diagnosis not present

## 2018-01-10 DIAGNOSIS — C7951 Secondary malignant neoplasm of bone: Secondary | ICD-10-CM | POA: Diagnosis not present

## 2018-01-10 DIAGNOSIS — E273 Drug-induced adrenocortical insufficiency: Secondary | ICD-10-CM

## 2018-01-10 MED ORDER — SERTRALINE HCL 50 MG PO TABS: 50.0000 mg | ORAL_TABLET | Freq: Every day | ORAL | 3 refills | Status: DC

## 2018-01-10 MED ORDER — LISINOPRIL 5 MG PO TABS: 5.0000 mg | ORAL_TABLET | Freq: Two times a day (BID) | ORAL | 1 refills | Status: DC

## 2018-01-10 NOTE — Assessment & Plan Note (Signed)
Following with endocrine

## 2018-01-10 NOTE — Assessment & Plan Note (Signed)
Had an IV iron infusion September 14, 2017 Most recent blood work at [facility] showed normal hemoglobin

## 2018-01-10 NOTE — Assessment & Plan Note (Signed)
Following with endocrine at [facility]

## 2018-01-10 NOTE — Assessment & Plan Note (Signed)
Following with oncology at [facility] scan showed no evidence of metastatic disease

## 2018-01-10 NOTE — Assessment & Plan Note (Signed)
Blood pressure well controlled Continue lisinopril 5 mg twice daily-refilled today Blood work has been checked at [facility], so no need for additional blood work today

## 2018-01-10 NOTE — Assessment & Plan Note (Signed)
He is experiencing both depression and anxiety, but anxiety is more difficult to control He is interested in starting medication We will start sertraline 50 mg Discussed possible side effects Follow-up in 6 months, sooner if needed

## 2018-01-10 NOTE — Assessment & Plan Note (Signed)
Occasional GERD We will continue Zantac as needed-did discuss recall and then he can switch to Pepcid if he wishes

## 2018-01-17 DIAGNOSIS — E2740 Unspecified adrenocortical insufficiency: Secondary | ICD-10-CM | POA: Diagnosis not present

## 2018-01-17 DIAGNOSIS — E230 Hypopituitarism: Secondary | ICD-10-CM | POA: Diagnosis not present

## 2018-01-17 DIAGNOSIS — E038 Other specified hypothyroidism: Secondary | ICD-10-CM | POA: Diagnosis not present

## 2018-01-19 ENCOUNTER — Encounter: Payer: Self-pay | Admitting: Internal Medicine

## 2018-01-20 MED ORDER — ESCITALOPRAM OXALATE 10 MG PO TABS: 10.0000 mg | ORAL_TABLET | Freq: Every day | ORAL | 5 refills | Status: DC

## 2018-01-20 NOTE — Addendum Note (Signed)
Addended by: [name] on: 01/20/2018 01:56 PM   Modules accepted: Orders

## 2018-01-31 DIAGNOSIS — Z85820 Personal history of malignant melanoma of skin: Secondary | ICD-10-CM | POA: Diagnosis not present

## 2018-01-31 DIAGNOSIS — L814 Other melanin hyperpigmentation: Secondary | ICD-10-CM | POA: Diagnosis not present

## 2018-01-31 DIAGNOSIS — L570 Actinic keratosis: Secondary | ICD-10-CM | POA: Diagnosis not present

## 2018-01-31 DIAGNOSIS — D225 Melanocytic nevi of trunk: Secondary | ICD-10-CM | POA: Diagnosis not present

## 2018-02-09 DIAGNOSIS — C439 Malignant melanoma of skin, unspecified: Secondary | ICD-10-CM | POA: Diagnosis not present

## 2018-02-09 DIAGNOSIS — E2740 Unspecified adrenocortical insufficiency: Secondary | ICD-10-CM | POA: Diagnosis not present

## 2018-02-09 DIAGNOSIS — L80 Vitiligo: Secondary | ICD-10-CM | POA: Diagnosis not present

## 2018-02-09 DIAGNOSIS — C799 Secondary malignant neoplasm of unspecified site: Secondary | ICD-10-CM | POA: Diagnosis not present

## 2018-02-09 DIAGNOSIS — E039 Hypothyroidism, unspecified: Secondary | ICD-10-CM | POA: Diagnosis not present

## 2018-02-09 DIAGNOSIS — Z87891 Personal history of nicotine dependence: Secondary | ICD-10-CM | POA: Diagnosis not present

## 2018-02-09 DIAGNOSIS — Z85820 Personal history of malignant melanoma of skin: Secondary | ICD-10-CM | POA: Diagnosis not present

## 2018-02-09 DIAGNOSIS — K8689 Other specified diseases of pancreas: Secondary | ICD-10-CM | POA: Diagnosis not present

## 2018-02-09 DIAGNOSIS — Z08 Encounter for follow-up examination after completed treatment for malignant neoplasm: Secondary | ICD-10-CM | POA: Diagnosis not present

## 2018-02-09 DIAGNOSIS — M4319 Spondylolisthesis, multiple sites in spine: Secondary | ICD-10-CM | POA: Diagnosis not present

## 2018-02-09 DIAGNOSIS — Z9289 Personal history of other medical treatment: Secondary | ICD-10-CM | POA: Diagnosis not present

## 2018-02-21 ENCOUNTER — Ambulatory Visit: Payer: Medicare Other | Admitting: Internal Medicine

## 2018-03-02 DIAGNOSIS — H25013 Cortical age-related cataract, bilateral: Secondary | ICD-10-CM | POA: Diagnosis not present

## 2018-03-02 DIAGNOSIS — H52203 Unspecified astigmatism, bilateral: Secondary | ICD-10-CM | POA: Diagnosis not present

## 2018-03-17 DIAGNOSIS — H2511 Age-related nuclear cataract, right eye: Secondary | ICD-10-CM | POA: Diagnosis not present

## 2018-03-17 DIAGNOSIS — H25811 Combined forms of age-related cataract, right eye: Secondary | ICD-10-CM | POA: Diagnosis not present

## 2018-03-17 DIAGNOSIS — H21561 Pupillary abnormality, right eye: Secondary | ICD-10-CM | POA: Diagnosis not present

## 2018-03-25 NOTE — Progress Notes (Signed)
Subjective:    Patient ID: Maxwell Fox, male    DOB: September 06, 1945, 73 y.o.   MRN: [id number]  HPI The patient is here for follow up.  Anxiety, Depression: He started sertraline early December, but had side effects and we changed him to lexapro.  He is taking his medication daily as prescribed. He denies any side effects from the medication. He feels his anxiety is not controlled and the lexapro has not helped much.  He feels the depression is well controlled and he is happy with his current dose of medication for the depression.   His daughter is going through a bad divorce which is part of his depression and anxiety and his cancer recurring is the biggest source of his anxiety.    Medications and allergies reviewed with patient and updated if appropriate.  Patient Active Problem List   Diagnosis Date Noted  . Anxiety and depression 01/10/2018  . History of antineoplastic chemotherapy 07/19/2017  . Chronic midline low back pain without sciatica 04/19/2017  . Metastatic melanoma ([place]) 01/07/2017  . Adrenal insufficiency due to cancer therapy ([place]) 07/27/2016  . Secondary hypothyroidism 07/27/2016  . H/O gastric bypass 01/06/2016  . Pancreatic cyst 01/06/2016  . Metastatic melanoma of brain 12/31/2015  . GERD (gastroesophageal reflux disease) 11/01/2014  . Iron deficiency anemia   . B12 nutritional deficiency   . Hypertension   . Benign prostatic hyperplasia     Current Outpatient Medications on File Prior to Visit  Medication Sig Dispense Refill  . calcium gluconate 500 MG tablet Take 1 tablet (500 mg total) by mouth 2 (two) times daily.    . Cholecalciferol (VITAMIN D) 2000 UNITS tablet Take 1 tablet (2,000 Units total) by mouth daily. 30 tablet 11  . escitalopram (LEXAPRO) 10 MG tablet Take 1 tablet (10 mg total) by mouth daily. 30 tablet 5  . levothyroxine (SYNTHROID, LEVOTHROID) 88 MCG tablet Take by mouth.    Marland Kitchen lisinopril (PRINIVIL,ZESTRIL) 5 MG tablet Take 1 tablet (5  mg total) by mouth 2 (two) times daily. 180 tablet 1  . Multiple Vitamins-Minerals (MULTIVITAMIN) tablet Take 1 tablet by mouth daily.    . predniSONE (DELTASONE) 5 MG tablet Take 5 mg by mouth daily with breakfast.    . ranitidine (ZANTAC) 150 MG tablet Take 1 tablet (150 mg total) by mouth at bedtime. 90 tablet 1  . tamsulosin (FLOMAX) 0.4 MG CAPS capsule Take 1 capsule (0.4 mg total) by mouth daily. 90 capsule 1   No current facility-administered medications on file prior to visit.     Past Medical History:  Diagnosis Date  . B12 nutritional deficiency    malabsorbtion  . BPH (benign prostatic hypertrophy)   . Diverticulosis   . Hypertension   . Iron deficiency anemia    malabsorption related (s/p gastric bypass)  . Melanoma ([place])   . Obesity    s/p gastric bypass 2000, start 355#  . Pancreatic cyst   . Pulmonary nodules     Past Surgical History:  Procedure Laterality Date  . CRANIOTOMY FOR TUMOR  12/11/2015   metastatic melanoma  . MOHS SURGERY  2012   melanoma, mid back, [place]  . ROUX-EN-Y PROCEDURE  2000  . TOTAL SHOULDER REPLACEMENT Right 2011   [place]    Social History   Socioeconomic History  . Marital status: Married    Spouse name: Not on file  . Number of children: 2  . Years of education: Not on file  . Highest  education level: Not on file  Occupational History  . Not on file  Social Needs  . Financial resource strain: Not on file  . Food insecurity:    Worry: Not on file    Inability: Not on file  . Transportation needs:    Medical: Not on file    Non-medical: Not on file  Tobacco Use  . Smoking status: Former Smoker    Last attempt to quit: 02/03/1963    Years since quitting: 55.1  . Smokeless tobacco: Never Used  Substance and Sexual Activity  . Alcohol use: Not on file    Comment: wine 2x/week  . Drug use: Not on file  . Sexual activity: Not on file  Lifestyle  . Physical activity:    Days per week: Not on file    Minutes per  session: Not on file  . Stress: Not on file  Relationships  . Social connections:    Talks on phone: Not on file    Gets together: Not on file    Attends religious service: Not on file    Active member of club or organization: Not on file    Attends meetings of clubs or organizations: Not on file    Relationship status: Not on file  Other Topics Concern  . 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 Hypertension Father   . Macular degeneration Mother   . Melanoma Mother   . Melanoma Sister   . Breast cancer Sister   . Uterine cancer Sister   . Stomach cancer Sister   . Melanoma Brother     Review of Systems  Constitutional: Negative for appetite change.  Respiratory: Negative for shortness of breath.   Cardiovascular: Negative for chest pain and palpitations.  Neurological: Negative for light-headedness and headaches.  Psychiatric/Behavioral: Negative for sleep disturbance.       Objective:   Vitals:   03/28/18 0800  BP: 122/64  Pulse: 64  Resp: 16  Temp: 97.9 F (36.6 C)  SpO2: 99%   BP Readings from Last 3 Encounters:  03/28/18 122/64  01/10/18 132/76  09/14/17 128/70   Wt Readings from Last 3 Encounters:  03/28/18 202 lb (91.6 kg)  01/10/18 197 lb 12.8 oz (89.7 kg)  01/07/17 180 lb (81.6 kg)   Body mass index is 27.4 kg/m.   Physical Exam    Constitutional: Appears well-developed and well-nourished. No distress.  HENT:  Head: Normocephalic and atraumatic.  Neck: Neck supple. No tracheal deviation present. No thyromegaly present.  No cervical lymphadenopathy Cardiovascular: Normal rate, regular rhythm and normal heart sounds.   No murmur heard. No carotid bruit .  No edema Pulmonary/Chest: Effort normal and breath sounds normal. No respiratory distress. No has no wheezes. No rales.  Skin: Skin is warm and dry.  Not diaphoretic.  Psychiatric: Normal mood and affect. Behavior is normal.      Assessment & Plan:    See Problem List for Assessment and Plan of chronic medical problems.

## 2018-03-28 ENCOUNTER — Ambulatory Visit (INDEPENDENT_AMBULATORY_CARE_PROVIDER_SITE_OTHER): Payer: Medicare Other | Admitting: Internal Medicine

## 2018-03-28 ENCOUNTER — Encounter: Payer: Self-pay | Admitting: Internal Medicine

## 2018-03-28 VITALS — BP 122/64 | HR 64 | Temp 97.9°F | Resp 16 | Ht 72.0 in | Wt 202.0 lb

## 2018-03-28 DIAGNOSIS — F329 Major depressive disorder, single episode, unspecified: Secondary | ICD-10-CM

## 2018-03-28 DIAGNOSIS — F32A Depression, unspecified: Secondary | ICD-10-CM

## 2018-03-28 DIAGNOSIS — F419 Anxiety disorder, unspecified: Secondary | ICD-10-CM | POA: Diagnosis not present

## 2018-03-28 MED ORDER — CLONAZEPAM 0.5 MG PO TABS: 0.5000 mg | ORAL_TABLET | Freq: Two times a day (BID) | ORAL | 2 refills | Status: DC

## 2018-03-28 NOTE — Patient Instructions (Addendum)
  Medications reviewed and updated.  Changes include :   Start clonazepam 0.5 mg twice daily  Your prescription(s) have been submitted to your pharmacy. Please take as directed and contact our office if you believe you are having problem(s) with the medication(s).   Please followup in December as scheduled

## 2018-03-28 NOTE — Assessment & Plan Note (Signed)
Depression well controlled with lexapro 10 mg daily Anxiety not improved with lexapro - he has generalized anxiety as well as situational anxiety Discussed options We will continue Lexapro 10 mg daily We will also try clonazepam 0.5 mg twice daily-discussed possible side effects including risk of addiction, drowsiness He will let me know how effective this was Follow-up as scheduled, sooner if needed

## 2018-03-31 ENCOUNTER — Encounter: Payer: Self-pay | Admitting: Internal Medicine

## 2018-04-02 MED ORDER — CLONAZEPAM 1 MG PO TABS: 1.0000 mg | ORAL_TABLET | Freq: Two times a day (BID) | ORAL | 1 refills | Status: DC | PRN

## 2018-04-07 DIAGNOSIS — H25812 Combined forms of age-related cataract, left eye: Secondary | ICD-10-CM | POA: Diagnosis not present

## 2018-04-07 DIAGNOSIS — H2181 Floppy iris syndrome: Secondary | ICD-10-CM | POA: Diagnosis not present

## 2018-04-07 DIAGNOSIS — H21562 Pupillary abnormality, left eye: Secondary | ICD-10-CM | POA: Diagnosis not present

## 2018-04-07 DIAGNOSIS — H2512 Age-related nuclear cataract, left eye: Secondary | ICD-10-CM | POA: Diagnosis not present

## 2018-04-13 ENCOUNTER — Encounter: Payer: Self-pay | Admitting: Internal Medicine

## 2018-04-14 ENCOUNTER — Other Ambulatory Visit: Payer: Self-pay

## 2018-04-14 ENCOUNTER — Telehealth: Payer: Self-pay | Admitting: Internal Medicine

## 2018-04-14 MED ORDER — CLONAZEPAM 1 MG PO TABS: 1.0000 mg | ORAL_TABLET | Freq: Two times a day (BID) | ORAL | 1 refills | Status: DC | PRN

## 2018-04-14 NOTE — Telephone Encounter (Signed)
Copied from [place] [id number] [id number]. Topic: General - Other >> Apr 14, 2018 11:58 AM Lennox Solders wrote: Reason for CRM: pt said  dr burns was going to increase clonazepam to 1 mg twice a day after pt has finished 0.5 mg. Pt is out of 0.5 mg. Please see mychart 04-02-2018 . [facility] [address]

## 2018-04-14 NOTE — Progress Notes (Signed)
Pt states that you discussed in last visit that he can increase his clonazepam to 1 mg bid. He needs a refill. He ran out of the 0.5 mg. Thank you.

## 2018-04-14 NOTE — Telephone Encounter (Signed)
Prescription sent

## 2018-05-18 ENCOUNTER — Encounter: Payer: Self-pay | Admitting: Internal Medicine

## 2018-05-30 DIAGNOSIS — C44599 Other specified malignant neoplasm of skin of other part of trunk: Secondary | ICD-10-CM | POA: Diagnosis not present

## 2018-05-30 DIAGNOSIS — C7931 Secondary malignant neoplasm of brain: Secondary | ICD-10-CM | POA: Diagnosis not present

## 2018-06-19 ENCOUNTER — Other Ambulatory Visit: Payer: Self-pay | Admitting: Internal Medicine

## 2018-06-20 NOTE — Telephone Encounter (Signed)
Check [place] registry last filled 05/18/2018.Marland KitchenJohny Chess

## 2018-07-05 DIAGNOSIS — Z85820 Personal history of malignant melanoma of skin: Secondary | ICD-10-CM | POA: Diagnosis not present

## 2018-07-05 DIAGNOSIS — D1801 Hemangioma of skin and subcutaneous tissue: Secondary | ICD-10-CM | POA: Diagnosis not present

## 2018-07-05 DIAGNOSIS — L821 Other seborrheic keratosis: Secondary | ICD-10-CM | POA: Diagnosis not present

## 2018-07-05 DIAGNOSIS — D225 Melanocytic nevi of trunk: Secondary | ICD-10-CM | POA: Diagnosis not present

## 2018-07-05 DIAGNOSIS — L814 Other melanin hyperpigmentation: Secondary | ICD-10-CM | POA: Diagnosis not present

## 2018-07-06 ENCOUNTER — Encounter: Payer: Self-pay | Admitting: Internal Medicine

## 2018-07-06 MED ORDER — ESCITALOPRAM OXALATE 20 MG PO TABS: 20.0000 mg | ORAL_TABLET | Freq: Every day | ORAL | 1 refills | Status: DC

## 2018-07-20 DIAGNOSIS — Z79899 Other long term (current) drug therapy: Secondary | ICD-10-CM | POA: Diagnosis not present

## 2018-07-20 DIAGNOSIS — R55 Syncope and collapse: Secondary | ICD-10-CM | POA: Diagnosis not present

## 2018-07-20 DIAGNOSIS — Z923 Personal history of irradiation: Secondary | ICD-10-CM | POA: Diagnosis not present

## 2018-07-20 DIAGNOSIS — C799 Secondary malignant neoplasm of unspecified site: Secondary | ICD-10-CM | POA: Diagnosis not present

## 2018-07-20 DIAGNOSIS — K5730 Diverticulosis of large intestine without perforation or abscess without bleeding: Secondary | ICD-10-CM | POA: Diagnosis not present

## 2018-07-20 DIAGNOSIS — C439 Malignant melanoma of skin, unspecified: Secondary | ICD-10-CM | POA: Diagnosis not present

## 2018-07-20 DIAGNOSIS — Z87891 Personal history of nicotine dependence: Secondary | ICD-10-CM | POA: Diagnosis not present

## 2018-07-20 DIAGNOSIS — E273 Drug-induced adrenocortical insufficiency: Secondary | ICD-10-CM | POA: Diagnosis not present

## 2018-07-20 DIAGNOSIS — C7931 Secondary malignant neoplasm of brain: Secondary | ICD-10-CM | POA: Diagnosis not present

## 2018-07-20 DIAGNOSIS — G8929 Other chronic pain: Secondary | ICD-10-CM | POA: Diagnosis not present

## 2018-07-20 DIAGNOSIS — Z9889 Other specified postprocedural states: Secondary | ICD-10-CM | POA: Diagnosis not present

## 2018-07-20 DIAGNOSIS — Z9221 Personal history of antineoplastic chemotherapy: Secondary | ICD-10-CM | POA: Diagnosis not present

## 2018-07-20 DIAGNOSIS — E230 Hypopituitarism: Secondary | ICD-10-CM | POA: Diagnosis not present

## 2018-07-20 DIAGNOSIS — Z9049 Acquired absence of other specified parts of digestive tract: Secondary | ICD-10-CM | POA: Diagnosis not present

## 2018-07-20 DIAGNOSIS — E038 Other specified hypothyroidism: Secondary | ICD-10-CM | POA: Diagnosis not present

## 2018-07-20 DIAGNOSIS — L80 Vitiligo: Secondary | ICD-10-CM | POA: Diagnosis not present

## 2018-07-20 DIAGNOSIS — K862 Cyst of pancreas: Secondary | ICD-10-CM | POA: Diagnosis not present

## 2018-07-20 DIAGNOSIS — C4359 Malignant melanoma of other part of trunk: Secondary | ICD-10-CM | POA: Diagnosis not present

## 2018-07-20 DIAGNOSIS — C7951 Secondary malignant neoplasm of bone: Secondary | ICD-10-CM | POA: Diagnosis not present

## 2018-07-20 DIAGNOSIS — T451X5A Adverse effect of antineoplastic and immunosuppressive drugs, initial encounter: Secondary | ICD-10-CM | POA: Diagnosis not present

## 2018-07-20 DIAGNOSIS — M549 Dorsalgia, unspecified: Secondary | ICD-10-CM | POA: Diagnosis not present

## 2018-07-21 ENCOUNTER — Other Ambulatory Visit: Payer: Self-pay | Admitting: Internal Medicine

## 2018-08-16 ENCOUNTER — Encounter (HOSPITAL_COMMUNITY): Payer: Self-pay

## 2018-08-16 ENCOUNTER — Telehealth: Payer: Self-pay

## 2018-08-16 ENCOUNTER — Other Ambulatory Visit: Payer: Self-pay

## 2018-08-16 ENCOUNTER — Emergency Department (HOSPITAL_COMMUNITY): Payer: Medicare Other

## 2018-08-16 ENCOUNTER — Emergency Department (HOSPITAL_COMMUNITY)
Admission: EM | Admit: 2018-08-16 | Discharge: 2018-08-16 | Disposition: A | Discharge status: Home / Self Care | Payer: Medicare Other | Attending: Emergency Medicine | Admitting: Emergency Medicine

## 2018-08-16 ENCOUNTER — Ambulatory Visit: Payer: Self-pay | Admitting: *Deleted

## 2018-08-16 DIAGNOSIS — I499 Cardiac arrhythmia, unspecified: Secondary | ICD-10-CM | POA: Diagnosis not present

## 2018-08-16 DIAGNOSIS — R008 Other abnormalities of heart beat: Secondary | ICD-10-CM | POA: Diagnosis not present

## 2018-08-16 DIAGNOSIS — Z87891 Personal history of nicotine dependence: Secondary | ICD-10-CM | POA: Diagnosis not present

## 2018-08-16 DIAGNOSIS — Z79899 Other long term (current) drug therapy: Secondary | ICD-10-CM | POA: Insufficient documentation

## 2018-08-16 DIAGNOSIS — R531 Weakness: Secondary | ICD-10-CM | POA: Diagnosis not present

## 2018-08-16 DIAGNOSIS — R5383 Other fatigue: Secondary | ICD-10-CM | POA: Diagnosis not present

## 2018-08-16 DIAGNOSIS — Z9884 Bariatric surgery status: Secondary | ICD-10-CM | POA: Insufficient documentation

## 2018-08-16 DIAGNOSIS — I10 Essential (primary) hypertension: Secondary | ICD-10-CM | POA: Insufficient documentation

## 2018-08-16 DIAGNOSIS — R001 Bradycardia, unspecified: Secondary | ICD-10-CM | POA: Diagnosis not present

## 2018-08-16 DIAGNOSIS — I498 Other specified cardiac arrhythmias: Secondary | ICD-10-CM

## 2018-08-16 LAB — COMPREHENSIVE METABOLIC PANEL
ALT: 31 U/L (ref 0–44)
AST: 45 U/L — ABNORMAL HIGH (ref 15–41)
Albumin: 3.9 g/dL (ref 3.5–5.0)
Alkaline Phosphatase: 52 U/L (ref 38–126)
Anion gap: 8 (ref 5–15)
BUN: 19 mg/dL (ref 8–23)
CO2: 26 mmol/L (ref 22–32)
Calcium: 9 mg/dL (ref 8.9–10.3)
Chloride: 108 mmol/L (ref 98–111)
Creatinine, Ser: 1.01 mg/dL (ref 0.61–1.24)
GFR calc Af Amer: >60 mL/min (ref >60)
GFR calc non Af Amer: >60 mL/min (ref >60)
Glucose, Bld: 86 mg/dL (ref 70–99)
Potassium: 4 mmol/L (ref 3.5–5.1)
Sodium: 142 mmol/L (ref 135–145)
Total Bilirubin: 0.4 mg/dL (ref 0.3–1.2)
Total Protein: 6.5 g/dL (ref 6.5–8.1)

## 2018-08-16 LAB — CBC WITH DIFFERENTIAL/PLATELET
Abs Immature Granulocytes: 0.02 10*3/uL (ref 0.00–0.07)
Basophils Absolute: 0.1 10*3/uL (ref 0.0–0.1)
Basophils Relative: 1 %
Eosinophils Absolute: 0.1 10*3/uL (ref 0.0–0.5)
Eosinophils Relative: 2 %
HCT: 43.2 % (ref 39.0–52.0)
Hemoglobin: 13.4 g/dL (ref 13.0–17.0)
Immature Granulocytes: 0 %
Lymphocytes Relative: 36 %
Lymphs Abs: 2.5 10*3/uL (ref 0.7–4.0)
MCH: 29 pg (ref 26.0–34.0)
MCHC: 31 g/dL (ref 30.0–36.0)
MCV: 93.5 fL (ref 80.0–100.0)
Monocytes Absolute: 0.7 10*3/uL (ref 0.1–1.0)
Monocytes Relative: 10 %
Neutro Abs: 3.6 10*3/uL (ref 1.7–7.7)
Neutrophils Relative %: 51 %
Platelets: 147 10*3/uL — ABNORMAL LOW (ref 150–400)
RBC: 4.62 MIL/uL (ref 4.22–5.81)
RDW: 13.9 % (ref 11.5–15.5)
WBC: 7.1 10*3/uL (ref 4.0–10.5)
nRBC: 0 % (ref 0.0–0.2)

## 2018-08-16 LAB — URINALYSIS, ROUTINE W REFLEX MICROSCOPIC
Bilirubin Urine: NEGATIVE
Glucose, UA: NEGATIVE mg/dL
Hgb urine dipstick: NEGATIVE
Ketones, ur: NEGATIVE mg/dL
Leukocytes,Ua: NEGATIVE
Nitrite: NEGATIVE
Protein, ur: NEGATIVE mg/dL
Specific Gravity, Urine: 1.01 (ref 1.005–1.030)
pH: 5 (ref 5.0–8.0)

## 2018-08-16 LAB — TROPONIN I (HIGH SENSITIVITY)
Troponin I (High Sensitivity): 7 ng/L (ref <18)
Troponin I (High Sensitivity): 6 ng/L (ref <18)

## 2018-08-16 LAB — MAGNESIUM: Magnesium: 2.3 mg/dL (ref 1.7–2.4)

## 2018-08-16 LAB — TSH: TSH: 0.55 u[IU]/mL (ref 0.350–4.500)

## 2018-08-16 NOTE — ED Triage Notes (Signed)
Pt called PCP this morning concerned about low heart rate, was advised to go to ED. Pt states no Hx of heart issues.

## 2018-08-16 NOTE — ED Notes (Signed)
PT pulse stat 45 while ambulating pulse 76 02=99%

## 2018-08-16 NOTE — ED Provider Notes (Signed)
[facility] DEPT Provider Note   CSN: [id number] Arrival date & time: 08/16/18  0810     History   Chief Complaint Chief Complaint  Patient presents with   Bradycardia    HPI Maxwell Fox is a 73 y.o. male.  He is presenting the emergency department complaining of a low heart rate that he is noticed over the past couple of days.  He says usually his heart rates in the 70s and 80s but has been noticing numbers in the 30s.  Blood pressures been okay.  He denies any chest pain or shortness of breath.  He said over the past couple of months he has had 3 or 4 episodes of being less responsive out of it and one was associated with diaphoresis.  He has not experienced with a slow heart rate.  No new medications.  History of metastatic melanoma but his last checkups have been good.  No prior cardiac issues.  No recent illness no fevers cough chest pain shortness of breath vomiting diarrhea urinary symptoms.     The history is provided by the patient.  Palpitations Palpitations quality:  Irregular Onset quality:  Gradual Duration:  2 days Timing:  Constant Progression:  Unchanged Chronicity:  New Relieved by:  None tried Worsened by:  Nothing Ineffective treatments:  None tried Associated symptoms: malaise/fatigue   Associated symptoms: no back pain, no chest pain, no cough, no lower extremity edema, no nausea, no shortness of breath and no vomiting     Past Medical History:  Diagnosis Date   B12 nutritional deficiency    malabsorbtion   BPH (benign prostatic hypertrophy)    Diverticulosis    Hypertension    Iron deficiency anemia    malabsorption related (s/p gastric bypass)   Melanoma ([place])    Obesity    s/p gastric bypass 2000, start 355#   Pancreatic cyst    Pulmonary nodules     Patient Active Problem List   Diagnosis Date Noted   Anxiety and depression 01/10/2018   History of antineoplastic chemotherapy 07/19/2017    Chronic midline low back pain without sciatica 04/19/2017   Metastatic melanoma ([place]) 01/07/2017   Adrenal insufficiency due to cancer therapy ([place]) 07/27/2016   Secondary hypothyroidism 07/27/2016   H/O gastric bypass 01/06/2016   Pancreatic cyst 01/06/2016   Metastatic melanoma of brain 12/31/2015   GERD (gastroesophageal reflux disease) 11/01/2014   Iron deficiency anemia    B12 nutritional deficiency    Hypertension    Benign prostatic hyperplasia     Past Surgical History:  Procedure Laterality Date   CRANIOTOMY FOR TUMOR  12/11/2015   metastatic melanoma   MOHS SURGERY  2012   melanoma, mid back, [place]   TOTAL SHOULDER REPLACEMENT Right 2011   [place]        Home Medications    Prior to Admission medications   Medication Sig Start Date End Date Taking? Authorizing Provider  calcium gluconate 500 MG tablet Take 1 tablet (500 mg total) by mouth 2 (two) times daily. 10/26/13   [name], MD  Cholecalciferol (VITAMIN D) 2000 UNITS tablet Take 1 tablet (2,000 Units total) by mouth daily. 10/26/13   [name], MD  clonazePAM (KLONOPIN) 1 MG tablet TAKE 1 TABLET(1 MG) BY MOUTH TWICE DAILY AS NEEDED FOR ANXIETY 06/20/18   Burns, [name], MD  escitalopram (LEXAPRO) 10 MG tablet TAKE 1 TABLET(10 MG) BY MOUTH DAILY 07/21/18  [name], MD  escitalopram (LEXAPRO) 20 MG tablet Take 1 tablet (20 mg total) by mouth daily. 07/06/18   [name], MD  levothyroxine (SYNTHROID, LEVOTHROID) 88 MCG tablet Take by mouth. 01/03/18   [provider]  lisinopril (PRINIVIL,ZESTRIL) 5 MG tablet Take 1 tablet (5 mg total) by mouth 2 (two) times daily. 01/10/18   [name], MD  Multiple Vitamins-Minerals (MULTIVITAMIN) tablet Take 1 tablet by mouth daily. 10/26/13   [name], MD  predniSONE (DELTASONE) 5 MG tablet Take 5 mg by mouth daily with breakfast.    [provider]  ranitidine (ZANTAC) 150  MG tablet Take 1 tablet (150 mg total) by mouth at bedtime. 11/01/14   [name], MD  tamsulosin (FLOMAX) 0.4 MG CAPS capsule Take 1 capsule (0.4 mg total) by mouth daily. 10/26/13   [name], MD    Family History Family History  Problem Relation Age of Onset   Hypertension Father    Macular degeneration Mother    Melanoma Mother    Melanoma Sister    Breast cancer Sister    Uterine cancer Sister    Stomach cancer Sister    Melanoma Brother     Social History Social History   Tobacco Use   Smoking status: Former Smoker    Quit date: 02/03/1963    Years since quitting: 55.5   Smokeless tobacco: Never Used  Substance Use Topics   Alcohol use: Not on file    Comment: wine 2x/week   Drug use: Not on file     Allergies   Sertraline   Review of Systems Review of Systems  Constitutional: Positive for fatigue and malaise/fatigue. Negative for fever.  HENT: Negative for sore throat.   Eyes: Negative for visual disturbance.  Respiratory: Negative for cough and shortness of breath.   Cardiovascular: Positive for palpitations. Negative for chest pain.  Gastrointestinal: Negative for abdominal pain, nausea and vomiting.  Genitourinary: Negative for dysuria.  Musculoskeletal: Negative for back pain.  Skin: Negative for rash.  Neurological: Negative for syncope and headaches.     Physical Exam Updated Vital Signs BP (!) 143/70    Pulse 68    Temp 97.9 F (36.6 C) (Oral)    Resp 16    Ht 5\' 11"  (1.803 m)    Wt 92.5 kg    SpO2 100%    BMI 28.45 kg/m   Physical Exam Vitals signs and nursing note reviewed.  Constitutional:      Appearance: He is well-developed.  HENT:     Head: Normocephalic and atraumatic.  Eyes:     Conjunctiva/sclera: Conjunctivae normal.  Neck:     Musculoskeletal: Neck supple.  Cardiovascular:     Rate and Rhythm: Regular rhythm. Bradycardia present.     Pulses: Normal pulses.     Heart sounds: No murmur.    Pulmonary:     Effort: Pulmonary effort is normal. No respiratory distress.     Breath sounds: Normal breath sounds.  Abdominal:     Palpations: Abdomen is soft.     Tenderness: There is no abdominal tenderness.  Musculoskeletal: Normal range of motion.     Right lower leg: No edema.     Left lower leg: No edema.  Skin:    General: Skin is warm and dry.     Capillary Refill: Capillary refill takes less than 2 seconds.  Neurological:     General: No focal deficit present.  Mental Status: He is alert and oriented to person, place, and time.     Gait: Gait normal.      ED Treatments / Results  Labs (all labs ordered are listed, but only abnormal results are displayed) Labs Reviewed  COMPREHENSIVE METABOLIC PANEL - Abnormal; Notable for the following components:      Result Value   AST 45 (*)    All other components within normal limits  CBC WITH DIFFERENTIAL/PLATELET - Abnormal; Notable for the following components:   Platelets 147 (*)    All other components within normal limits  MAGNESIUM  URINALYSIS, ROUTINE W REFLEX MICROSCOPIC  TSH  TROPONIN I (HIGH SENSITIVITY)  TROPONIN I (HIGH SENSITIVITY)    EKG EKG Interpretation  Date/Time:  Tuesday August 16 2018 08:21:54 EDT Ventricular Rate:  83 PR Interval:    QRS Duration: 108 QT Interval:  425 QTC Calculation: 380 R Axis:   -8 Text Interpretation:  Sinus rhythm Ventricular bigeminy Borderline repolarization abnormality no prior to compare with Confirmed by Aletta Edouard [phone]) on 08/16/2018 8:29:12 AM   Radiology Dg Chest Port 1 View  Result Date: 08/16/2018 CLINICAL DATA:  Weakness and low heart rate EXAM: PORTABLE CHEST 1 VIEW COMPARISON:  None. FINDINGS: The heart size and mediastinal contours are within normal limits. Both lungs are clear. The visualized skeletal structures are unremarkable. IMPRESSION: No active disease. Electronically Signed   By: Abelardo Diesel M.D.   On: 08/16/2018 08:48     Procedures Procedures (including critical care time)  Medications Ordered in ED Medications - No data to display   Initial Impression / Assessment and Plan / ED Course  I have reviewed the triage vital signs and the nursing notes.  Pertinent labs & imaging results that were available during my care of the patient were reviewed by me and considered in my medical decision making (see chart for details).  Clinical Course as of Aug 15 1649  Tue Aug 15, 8836  5227 73 year old male with history of metastatic melanoma here with noting his heart rate to be in the 30s.  He is ultimately asymptomatic with this but also noticed a few periods of less responsiveness over the past few months b in the last 2 days when he noticed the pulse rate.  No new medications.  Differential diagnosis includes PVCs, ischemia, metabolic derangements, infection, anemia   [MB]  0852 Chest x-ray and EKG reviewed by me.  Chest x-ray does not show any obvious infiltrates.  EKG shows sinus rhythm with unifocal bigeminy and compensatory pauses..   [MB]  [id number] Discussed with Dr. Terrence Dupont from cardiology.  He said it did not sound like there was anything that would require him being admitted to the hospital and he recommends that the patient follow-up outpatient with him in the clinic so he can get an echo and some other tests.   [MB]  1154 Delta troponin unchanged.  Anticipate discharge soon.   [MB]    Clinical Course User Index [MB] Hayden Rasmussen, MD        Final Clinical Impressions(s) / ED Diagnoses   Final diagnoses:  [place]    ED Discharge Orders    None       Hayden Rasmussen, MD 08/16/18 1651

## 2018-08-16 NOTE — Telephone Encounter (Signed)
Pt called in c/o a slow heart rate in the 30's this morning.   It has been slow over the last couple of days in the 30-50 range.   He has had some confusion and sweaty spells 3 times in the last month. I had him take his BP while on the phone with him.   It was 122/78 pulse 33. I instructed him to go to the ED now that was too low a heart rate.   He agreed and is having his wife drive him to [facility] ED now.  I sent these notes to Dr. Quay Burow so she would be aware.   Reason for Disposition . Heart beating very slowly (e.g., < 50 / minute)  (Exception: athlete)  Answer Assessment - Initial Assessment Questions 1. DESCRIPTION: "Please describe your heart rate or heart beat that you are having" (e.g., fast/slow, regular/irregular, skipped or extra beats, "palpitations")     My heart rate is low at 34.  47 then 52 was the highest this morning.   I've had this problem before.   I've had some dizzy spells and confusion that I talked with my oncologist about.   I go to [facility] every 3 months due to melanoma.   It's in remission. 2. ONSET: "When did it start?" (Minutes, hours or days)      I take my BP this morning and it was 34.   2 days ago it was 54. 3. DURATION: "How long does it last" (e.g., seconds, minutes, hours)     Noticed over the last couple of days. 4. PATTERN "Does it come and go, or has it been constant since it started?"  "Does it get worse with exertion?"   "Are you feeling it now?"     Comes and goes 5. TAP: "Using your hand, can you tap out what you are feeling on a chair or table in front of you, so that I can hear?" (Note: not all patients can do this)       34 on BP machine. 6. HEART RATE: "Can you tell me your heart rate?" "How many beats in 15 seconds?"  (Note: not all patients can do this)       *No Answer* 7. RECURRENT SYMPTOM: "Have you ever had this before?" If so, ask: "When was the last time?" and "What happened that time?"      In the past. 8. CAUSE: "What do you think  is causing the palpitations?"     I don't know.   9. CARDIAC HISTORY: "Do you have any history of heart disease?" (e.g., heart attack, angina, bypass surgery, angioplasty, arrhythmia)      No 10. OTHER SYMPTOMS: "Do you have any other symptoms?" (e.g., dizziness, chest pain, sweating, difficulty breathing)       Dizziness in the past. I get confused at times 3 times in the past month. 11. PREGNANCY: "Is there any chance you are pregnant?" "When was your last menstrual period?"       N/A  Protocols used: [place]

## 2018-08-16 NOTE — Discharge Instructions (Addendum)
You were seen in the emergency department for evaluation of low heart rate.  You had blood work EKG chest x-ray.  Your EKG showed that you were having extra beats from the bottom part of your heart called PVCs.  These likely are not being registered when you check your pulse.  Your heart rate was steady in the 70s here.  We talked with cardiology Dr. Terrence Dupont and he asked that you call his office for close follow-up.  Please return if any concerns.

## 2018-08-16 NOTE — Telephone Encounter (Signed)
LVM letting pt know response below.  

## 2018-08-16 NOTE — Telephone Encounter (Signed)
noted 

## 2018-08-16 NOTE — Telephone Encounter (Signed)
He is a good cardiologist and I think he will be in good hands.

## 2018-08-16 NOTE — Telephone Encounter (Signed)
Copied from [place] [id number]. Topic: General - Other >> Aug 16, 2018  2:34 PM Celene Kras A wrote: Reason for CRM: Pt was referred by the hospital to see a cardiologist and he is requesting to know if PCP would agree with referral. Pt states the doctor is Charolette Forward a cardiologist. Pt states he is supposed to see him tomorrow. Please advise.

## 2018-08-17 ENCOUNTER — Other Ambulatory Visit (HOSPITAL_COMMUNITY): Payer: Self-pay | Admitting: Cardiology

## 2018-08-17 ENCOUNTER — Other Ambulatory Visit: Payer: Self-pay | Admitting: Cardiology

## 2018-08-17 DIAGNOSIS — E039 Hypothyroidism, unspecified: Secondary | ICD-10-CM | POA: Diagnosis not present

## 2018-08-17 DIAGNOSIS — R531 Weakness: Secondary | ICD-10-CM | POA: Diagnosis not present

## 2018-08-17 DIAGNOSIS — R001 Bradycardia, unspecified: Secondary | ICD-10-CM | POA: Diagnosis not present

## 2018-08-17 DIAGNOSIS — I10 Essential (primary) hypertension: Secondary | ICD-10-CM | POA: Diagnosis not present

## 2018-08-17 DIAGNOSIS — R079 Chest pain, unspecified: Secondary | ICD-10-CM

## 2018-08-17 DIAGNOSIS — F17290 Nicotine dependence, other tobacco product, uncomplicated: Secondary | ICD-10-CM | POA: Diagnosis not present

## 2018-08-20 ENCOUNTER — Other Ambulatory Visit: Payer: Self-pay | Admitting: Internal Medicine

## 2018-08-22 NOTE — Telephone Encounter (Signed)
Last OV 03/28/18 Next OV 01/12/19  [place] Controlled Substance Database checked. Last filled on 07/22/18

## 2018-08-24 ENCOUNTER — Ambulatory Visit (HOSPITAL_COMMUNITY)
Admission: RE | Admit: 2018-08-24 | Discharge: 2018-08-24 | Disposition: A | Discharge status: Home / Self Care | Payer: Medicare Other | Source: Ambulatory Visit | Attending: Cardiology | Admitting: Cardiology

## 2018-08-24 DIAGNOSIS — R531 Weakness: Secondary | ICD-10-CM | POA: Diagnosis not present

## 2018-08-24 DIAGNOSIS — R079 Chest pain, unspecified: Secondary | ICD-10-CM

## 2018-08-24 DIAGNOSIS — F17290 Nicotine dependence, other tobacco product, uncomplicated: Secondary | ICD-10-CM | POA: Diagnosis not present

## 2018-08-24 DIAGNOSIS — I10 Essential (primary) hypertension: Secondary | ICD-10-CM | POA: Diagnosis not present

## 2018-08-24 DIAGNOSIS — R9431 Abnormal electrocardiogram [ECG] [EKG]: Secondary | ICD-10-CM | POA: Diagnosis not present

## 2018-08-24 MED ORDER — REGADENOSON 0.4 MG/5ML IV SOLN
INTRAVENOUS | Status: AC
  Filled 2018-08-24: qty 5

## 2018-08-24 MED ORDER — TECHNETIUM TC 99M TETROFOSMIN IV KIT
30.0000 | PACK | Freq: Once | INTRAVENOUS | Status: AC | PRN
  Administered 2018-08-24: 30 via INTRAVENOUS

## 2018-08-24 MED ORDER — REGADENOSON 0.4 MG/5ML IV SOLN
0.4000 mg | Freq: Once | INTRAVENOUS | Status: AC
  Administered 2018-08-24: 0.4 mg via INTRAVENOUS

## 2018-08-24 MED ORDER — TECHNETIUM TC 99M TETROFOSMIN IV KIT
20.0000 | PACK | Freq: Once | INTRAVENOUS | Status: AC | PRN
  Administered 2018-08-24: 20 via INTRAVENOUS

## 2018-08-25 DIAGNOSIS — E273 Drug-induced adrenocortical insufficiency: Secondary | ICD-10-CM | POA: Diagnosis not present

## 2018-08-25 DIAGNOSIS — E230 Hypopituitarism: Secondary | ICD-10-CM | POA: Diagnosis not present

## 2018-08-25 DIAGNOSIS — E038 Other specified hypothyroidism: Secondary | ICD-10-CM | POA: Diagnosis not present

## 2018-08-30 DIAGNOSIS — R001 Bradycardia, unspecified: Secondary | ICD-10-CM | POA: Diagnosis not present

## 2018-08-30 DIAGNOSIS — I440 Atrioventricular block, first degree: Secondary | ICD-10-CM | POA: Diagnosis not present

## 2018-08-30 DIAGNOSIS — I10 Essential (primary) hypertension: Secondary | ICD-10-CM | POA: Diagnosis not present

## 2018-08-30 DIAGNOSIS — R9431 Abnormal electrocardiogram [ECG] [EKG]: Secondary | ICD-10-CM | POA: Diagnosis not present

## 2018-10-03 DIAGNOSIS — Z85841 Personal history of malignant neoplasm of brain: Secondary | ICD-10-CM | POA: Diagnosis not present

## 2018-10-03 DIAGNOSIS — E2740 Unspecified adrenocortical insufficiency: Secondary | ICD-10-CM | POA: Diagnosis not present

## 2018-10-03 DIAGNOSIS — C801 Malignant (primary) neoplasm, unspecified: Secondary | ICD-10-CM | POA: Diagnosis not present

## 2018-10-03 DIAGNOSIS — C7951 Secondary malignant neoplasm of bone: Secondary | ICD-10-CM | POA: Diagnosis not present

## 2018-10-03 DIAGNOSIS — Z923 Personal history of irradiation: Secondary | ICD-10-CM | POA: Diagnosis not present

## 2018-10-03 DIAGNOSIS — C7931 Secondary malignant neoplasm of brain: Secondary | ICD-10-CM | POA: Diagnosis not present

## 2018-10-03 DIAGNOSIS — E039 Hypothyroidism, unspecified: Secondary | ICD-10-CM | POA: Diagnosis not present

## 2018-10-03 DIAGNOSIS — Z9889 Other specified postprocedural states: Secondary | ICD-10-CM | POA: Diagnosis not present

## 2018-10-03 DIAGNOSIS — Z08 Encounter for follow-up examination after completed treatment for malignant neoplasm: Secondary | ICD-10-CM | POA: Diagnosis not present

## 2018-10-06 DIAGNOSIS — Z85820 Personal history of malignant melanoma of skin: Secondary | ICD-10-CM | POA: Diagnosis not present

## 2018-10-06 DIAGNOSIS — L821 Other seborrheic keratosis: Secondary | ICD-10-CM | POA: Diagnosis not present

## 2018-10-06 DIAGNOSIS — D1801 Hemangioma of skin and subcutaneous tissue: Secondary | ICD-10-CM | POA: Diagnosis not present

## 2018-10-06 DIAGNOSIS — D225 Melanocytic nevi of trunk: Secondary | ICD-10-CM | POA: Diagnosis not present

## 2018-10-06 DIAGNOSIS — L814 Other melanin hyperpigmentation: Secondary | ICD-10-CM | POA: Diagnosis not present

## 2018-11-12 ENCOUNTER — Other Ambulatory Visit: Payer: Self-pay

## 2018-11-12 ENCOUNTER — Ambulatory Visit (INDEPENDENT_AMBULATORY_CARE_PROVIDER_SITE_OTHER): Payer: Medicare Other

## 2018-11-12 DIAGNOSIS — Z23 Encounter for immunization: Secondary | ICD-10-CM

## 2018-11-14 DIAGNOSIS — I10 Essential (primary) hypertension: Secondary | ICD-10-CM | POA: Diagnosis not present

## 2018-11-14 DIAGNOSIS — E785 Hyperlipidemia, unspecified: Secondary | ICD-10-CM | POA: Diagnosis not present

## 2018-11-14 DIAGNOSIS — R001 Bradycardia, unspecified: Secondary | ICD-10-CM | POA: Diagnosis not present

## 2018-11-14 DIAGNOSIS — I2510 Atherosclerotic heart disease of native coronary artery without angina pectoris: Secondary | ICD-10-CM | POA: Diagnosis not present

## 2018-11-14 DIAGNOSIS — I4940 Unspecified premature depolarization: Secondary | ICD-10-CM | POA: Diagnosis not present

## 2018-11-14 DIAGNOSIS — E039 Hypothyroidism, unspecified: Secondary | ICD-10-CM | POA: Diagnosis not present

## 2018-11-14 DIAGNOSIS — F17290 Nicotine dependence, other tobacco product, uncomplicated: Secondary | ICD-10-CM | POA: Diagnosis not present

## 2018-11-16 DIAGNOSIS — L80 Vitiligo: Secondary | ICD-10-CM | POA: Diagnosis not present

## 2018-11-16 DIAGNOSIS — G8929 Other chronic pain: Secondary | ICD-10-CM | POA: Diagnosis not present

## 2018-11-16 DIAGNOSIS — E039 Hypothyroidism, unspecified: Secondary | ICD-10-CM | POA: Diagnosis not present

## 2018-11-16 DIAGNOSIS — E230 Hypopituitarism: Secondary | ICD-10-CM | POA: Diagnosis not present

## 2018-11-16 DIAGNOSIS — M549 Dorsalgia, unspecified: Secondary | ICD-10-CM | POA: Diagnosis not present

## 2018-11-16 DIAGNOSIS — Z87891 Personal history of nicotine dependence: Secondary | ICD-10-CM | POA: Diagnosis not present

## 2018-11-16 DIAGNOSIS — C7931 Secondary malignant neoplasm of brain: Secondary | ICD-10-CM | POA: Diagnosis not present

## 2018-11-16 DIAGNOSIS — Z9221 Personal history of antineoplastic chemotherapy: Secondary | ICD-10-CM | POA: Diagnosis not present

## 2018-11-16 DIAGNOSIS — C439 Malignant melanoma of skin, unspecified: Secondary | ICD-10-CM | POA: Diagnosis not present

## 2018-11-16 DIAGNOSIS — E273 Drug-induced adrenocortical insufficiency: Secondary | ICD-10-CM | POA: Diagnosis not present

## 2018-11-16 DIAGNOSIS — K8689 Other specified diseases of pancreas: Secondary | ICD-10-CM | POA: Diagnosis not present

## 2018-11-16 DIAGNOSIS — E038 Other specified hypothyroidism: Secondary | ICD-10-CM | POA: Diagnosis not present

## 2018-11-16 DIAGNOSIS — C799 Secondary malignant neoplasm of unspecified site: Secondary | ICD-10-CM | POA: Diagnosis not present

## 2018-11-16 DIAGNOSIS — Z9889 Other specified postprocedural states: Secondary | ICD-10-CM | POA: Diagnosis not present

## 2018-11-16 DIAGNOSIS — E2740 Unspecified adrenocortical insufficiency: Secondary | ICD-10-CM | POA: Diagnosis not present

## 2018-11-26 ENCOUNTER — Encounter: Payer: Self-pay | Admitting: Internal Medicine

## 2018-11-27 MED ORDER — TAMSULOSIN HCL 0.4 MG PO CAPS: 0.4000 mg | ORAL_CAPSULE | Freq: Every day | ORAL | 1 refills | Status: DC

## 2018-12-31 ENCOUNTER — Other Ambulatory Visit: Payer: Self-pay | Admitting: Internal Medicine

## 2019-01-02 NOTE — Telephone Encounter (Signed)
[place] Controlled Database Checked Last filled: 11/22/18 # 60 LOV w/you: 03/28/18 Next appt w/you: 01/12/19

## 2019-01-05 DIAGNOSIS — D692 Other nonthrombocytopenic purpura: Secondary | ICD-10-CM | POA: Diagnosis not present

## 2019-01-05 DIAGNOSIS — L814 Other melanin hyperpigmentation: Secondary | ICD-10-CM | POA: Diagnosis not present

## 2019-01-05 DIAGNOSIS — D225 Melanocytic nevi of trunk: Secondary | ICD-10-CM | POA: Diagnosis not present

## 2019-01-05 DIAGNOSIS — Z85820 Personal history of malignant melanoma of skin: Secondary | ICD-10-CM | POA: Diagnosis not present

## 2019-01-05 DIAGNOSIS — L821 Other seborrheic keratosis: Secondary | ICD-10-CM | POA: Diagnosis not present

## 2019-01-05 DIAGNOSIS — D1801 Hemangioma of skin and subcutaneous tissue: Secondary | ICD-10-CM | POA: Diagnosis not present

## 2019-01-11 NOTE — Progress Notes (Signed)
Subjective:    Patient ID: Maxwell Fox, male    DOB: 19-Sep-1945, 73 y.o.   MRN: [id number]  HPI The patient is here for follow up.  He is walking daily - the other day he walked 10 miles.    Hypertension:  He is no longer taking the lisinopril.  His blood pressure has been good without medication.  Anxiety, Depression: He is taking his medication daily as prescribed. He denies any side effects from the medication. He feels his depression and anxiety are well controlled and he is happy with his current dose of medication.  His daughter is still going through a difficult divorce, which is causing a lot of anxiety, but he feels his medications are helping.  Adrenal insuff d/t cancer therapy, secondary hypothyroidism:  He is following with endocrine, but his endocrinologist is moving and he needs to establish with a new 1.    BPH:  He is taking flomax daily.  He denies obstructing urinary symptoms.    Metastatic melanoma:  He is following with [facility].  There is no evidence of cancer.     Medications and allergies reviewed with patient and updated if appropriate.  Patient Active Problem List   Diagnosis Date Noted  . Anxiety and depression 01/10/2018  . History of antineoplastic chemotherapy 07/19/2017  . Chronic midline low back pain without sciatica 04/19/2017  . Metastatic melanoma ([place]) 01/07/2017  . Adrenal insufficiency due to cancer therapy ([place]) 07/27/2016  . Secondary hypothyroidism 07/27/2016  . H/O gastric bypass 01/06/2016  . Pancreatic cyst 01/06/2016  . Metastatic melanoma of brain 12/31/2015  . GERD (gastroesophageal reflux disease) 11/01/2014  . Iron deficiency anemia   . B12 nutritional deficiency   . Hypertension   . Benign prostatic hyperplasia     Current Outpatient Medications on File Prior to Visit  Medication Sig Dispense Refill  . aspirin 81 MG EC tablet Take by mouth.    . calcium carbonate (OS-CAL - DOSED IN MG OF ELEMENTAL CALCIUM) 1250  (500 Ca) MG tablet Take by mouth.    . calcium gluconate 500 MG tablet Take 1 tablet (500 mg total) by mouth 2 (two) times daily.    . Cholecalciferol (VITAMIN D) 2000 UNITS tablet Take 1 tablet (2,000 Units total) by mouth daily. 30 tablet 11  . clonazePAM (KLONOPIN) 1 MG tablet TAKE 1 TABLET(1 MG) BY MOUTH TWICE DAILY AS NEEDED FOR ANXIETY 60 tablet 0  . escitalopram (LEXAPRO) 20 MG tablet Take 1 tablet (20 mg total) by mouth daily. 90 tablet 1  . levothyroxine (SYNTHROID, LEVOTHROID) 88 MCG tablet Take 88 mcg by mouth daily before breakfast.     . Multiple Vitamins-Minerals (MULTIVITAMIN) tablet Take 1 tablet by mouth daily.    . predniSONE (DELTASONE) 5 MG tablet Take 5 mg by mouth daily with breakfast.    . tamsulosin (FLOMAX) 0.4 MG CAPS capsule Take 1 capsule (0.4 mg total) by mouth daily after supper. 90 capsule 1  . lisinopril (PRINIVIL,ZESTRIL) 5 MG tablet Take 1 tablet (5 mg total) by mouth 2 (two) times daily. (Patient not taking: Reported on 08/16/2018) 180 tablet 1   No current facility-administered medications on file prior to visit.    Past Medical History:  Diagnosis Date  . B12 nutritional deficiency    malabsorbtion  . BPH (benign prostatic hypertrophy)   . Diverticulosis   . Hypertension   . Iron deficiency anemia    malabsorption related (s/p gastric bypass)  . Melanoma ([place])   .  Obesity    s/p gastric bypass 2000, start 355#  . Pancreatic cyst   . Pulmonary nodules     Past Surgical History:  Procedure Laterality Date  . CRANIOTOMY FOR TUMOR  12/11/2015   metastatic melanoma  . MOHS SURGERY  2012   melanoma, mid back, [place]  . ROUX-EN-Y PROCEDURE  2000  . TOTAL SHOULDER REPLACEMENT Right 2011   [place]    Social History   Socioeconomic History  . Marital status: Married    Spouse name: Not on file  . Number of children: 2  . Years of education: Not on file  . Highest education level: Not on file  Occupational History  . Not on file  Tobacco  Use  . Smoking status: Former Smoker    Quit date: 02/03/1963    Years since quitting: 55.9  . Smokeless tobacco: Never Used  Substance and Sexual Activity  . Alcohol use: Not on file    Comment: wine 2x/week  . Drug use: Not on file  . Sexual activity: Not on file  Other Topics Concern  . 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 Difficulty of Paying Living Expenses: Not on file  Food Insecurity:   . Worried About Charity fundraiser in the Last Year: Not on file  . Ran Out of Food in the Last Year: Not on file  Transportation Needs:   . Lack of Transportation (Medical): Not on file  . Lack of Transportation (Non-Medical): Not on file  Physical Activity:   . Days of Exercise per Week: Not on file  . Minutes of Exercise per Session: Not on file  Stress:   . Feeling of Stress : Not on file  Social Connections:   . Frequency of Communication with Friends and Family: Not on file  . Frequency of Social Gatherings with Friends and Family: Not on file  . Attends Religious Services: Not on file  . Active Member of Clubs or Organizations: Not on file  . Attends Archivist Meetings: Not on file  . Marital Status: Not on file    Family History  Problem Relation Age of Onset  . Hypertension Father   . Macular degeneration Mother   . Melanoma Mother   . Melanoma Sister   . Breast cancer Sister   . Uterine cancer Sister   . Stomach cancer Sister   . Melanoma Brother     Review of Systems  Constitutional: Negative for chills and fever.  Respiratory: Negative for cough, shortness of breath and wheezing.   Cardiovascular: Negative for chest pain and palpitations.  Genitourinary: Negative for difficulty urinating, dysuria and hematuria.  Neurological: Negative for light-headedness and headaches.       Objective:    Vitals:   01/12/19 0817  BP: 116/68  Pulse: 74  Temp: 97.6 F (36.4 C)  SpO2: 99%   BP Readings from Last 3 Encounters:  01/12/19 116/68  08/24/18 112/73  08/16/18 123/73   Wt Readings from Last 3 Encounters:  01/12/19 198 lb 1.3 oz (89.8 kg)  08/16/18 204 lb (92.5 kg)  03/28/18 202 lb (91.6 kg)   Body mass index is 27.63 kg/m.   Physical Exam    Constitutional: Appears well-developed and well-nourished. No distress.  HENT:  Head: Normocephalic and atraumatic.  Neck: Neck supple. No tracheal deviation present. No thyromegaly present.  No cervical lymphadenopathy Cardiovascular: Normal rate, regularly irregular rhythm - possible premature visits.  2/6 sys murmur heard. No carotid bruit .  No edema Pulmonary/Chest: Effort normal and breath sounds normal. No respiratory distress. No has no wheezes. No rales.  Skin: Skin is warm and dry. Not diaphoretic.  Psychiatric: Normal mood and affect. Behavior is normal.      Assessment & Plan:    No blood work ordered-reviewed blood work from [employer] List for Assessment and Plan of chronic medical problems.    This visit occurred during the SARS-CoV-2 public health emergency.  Safety protocols were in place, including screening questions prior to the visit, additional usage of staff PPE, and extensive cleaning of exam room while observing appropriate contact time as indicated for disinfecting solutions.

## 2019-01-11 NOTE — Patient Instructions (Addendum)
A referral was ordered for Endocrine - Dr Cruzita Lederer.   Medications reviewed and updated.  Changes include :   none   Please followup in 1 year

## 2019-01-12 ENCOUNTER — Other Ambulatory Visit: Payer: Self-pay

## 2019-01-12 ENCOUNTER — Encounter: Payer: Self-pay | Admitting: Internal Medicine

## 2019-01-12 ENCOUNTER — Ambulatory Visit (INDEPENDENT_AMBULATORY_CARE_PROVIDER_SITE_OTHER): Payer: Medicare Other | Admitting: Internal Medicine

## 2019-01-12 VITALS — BP 116/68 | HR 74 | Temp 97.6°F | Ht 71.0 in | Wt 198.1 lb

## 2019-01-12 DIAGNOSIS — F419 Anxiety disorder, unspecified: Secondary | ICD-10-CM | POA: Diagnosis not present

## 2019-01-12 DIAGNOSIS — I10 Essential (primary) hypertension: Secondary | ICD-10-CM

## 2019-01-12 DIAGNOSIS — E038 Other specified hypothyroidism: Secondary | ICD-10-CM

## 2019-01-12 DIAGNOSIS — N400 Enlarged prostate without lower urinary tract symptoms: Secondary | ICD-10-CM | POA: Diagnosis not present

## 2019-01-12 DIAGNOSIS — F32A Depression, unspecified: Secondary | ICD-10-CM

## 2019-01-12 DIAGNOSIS — E273 Drug-induced adrenocortical insufficiency: Secondary | ICD-10-CM | POA: Diagnosis not present

## 2019-01-12 DIAGNOSIS — F329 Major depressive disorder, single episode, unspecified: Secondary | ICD-10-CM

## 2019-01-12 DIAGNOSIS — C799 Secondary malignant neoplasm of unspecified site: Secondary | ICD-10-CM

## 2019-01-12 DIAGNOSIS — C439 Malignant melanoma of skin, unspecified: Secondary | ICD-10-CM

## 2019-01-12 NOTE — Assessment & Plan Note (Signed)
Controlled without medication Monitor off meds

## 2019-01-12 NOTE — Assessment & Plan Note (Signed)
Management per endocrine 

## 2019-01-12 NOTE — Assessment & Plan Note (Signed)
Has been following with endocrine-needs to establish with a new endocrinologist, because his endocrinologist was moving Referred to Dr. Cruzita Lederer

## 2019-01-12 NOTE — Assessment & Plan Note (Signed)
Controlled, stable Continue current dose of medication Continue Lexapro 20 mg daily Continue clonazepam 1 mg twice daily

## 2019-01-12 NOTE — Assessment & Plan Note (Signed)
Following with [facility] Currently in observation-imaging shows no disease

## 2019-02-06 ENCOUNTER — Encounter: Payer: Self-pay | Admitting: Internal Medicine

## 2019-02-06 DIAGNOSIS — R001 Bradycardia, unspecified: Secondary | ICD-10-CM

## 2019-02-06 DIAGNOSIS — I499 Cardiac arrhythmia, unspecified: Secondary | ICD-10-CM

## 2019-02-15 ENCOUNTER — Other Ambulatory Visit: Payer: Self-pay

## 2019-02-17 ENCOUNTER — Encounter: Payer: Self-pay | Admitting: Internal Medicine

## 2019-02-17 ENCOUNTER — Ambulatory Visit (INDEPENDENT_AMBULATORY_CARE_PROVIDER_SITE_OTHER): Payer: Medicare Other | Admitting: Internal Medicine

## 2019-02-17 ENCOUNTER — Other Ambulatory Visit: Payer: Self-pay

## 2019-02-17 VITALS — BP 110/60 | HR 32 | Ht 70.0 in | Wt 188.0 lb

## 2019-02-17 DIAGNOSIS — E038 Other specified hypothyroidism: Secondary | ICD-10-CM

## 2019-02-17 DIAGNOSIS — E273 Drug-induced adrenocortical insufficiency: Secondary | ICD-10-CM

## 2019-02-17 LAB — T3, FREE: T3, Free: 2.3 pg/mL (ref 2.3–4.2)

## 2019-02-17 LAB — TSH: TSH: 0.53 u[IU]/mL (ref 0.35–4.50)

## 2019-02-17 LAB — T4, FREE: Free T4: 1.36 ng/dL (ref 0.60–1.60)

## 2019-02-17 NOTE — Progress Notes (Signed)
Patient ID: Maxwell Fox, male   DOB: 07-Jul-1945, 74 y.o.   MRN: [id number]   This visit occurred during the SARS-CoV-2 public health emergency.  Safety protocols were in place, including screening questions prior to the visit, additional usage of staff PPE, and extensive cleaning of exam room while observing appropriate contact time as indicated for disinfecting solutions.   HPI  Maxwell Fox is a 74 y.o.-year-old male, referred by his PCP, Dr. Drema Dallas, for management of central adrenal insufficiency and central hypothyroidism. Prev. Saw Dr. Dara Lords at [facility] - reviewed her notes and labs.  Pt. has been dx with central hypothyroidism in 2018 due to lymphocytic hypophysitis after treatment for melanoma metastatic to the bone, brain (pembrolizumab, ipilimumab) >> on Levothyroxine 88 mcg.  She takes the thyroid hormone: - fasting - with water - separated by 90 min from b'fast  - + calcium at 4 pm - no iron, PPIs - + multivitamins at 4 pm  I reviewed pt's thyroid tests: Lab Results  Component Value Date   TSH 0.550 08/16/2018   TSH 2.39 11/01/2014   Per review of records from [facility]):   Antithyroid antibodies: No results found for: THGAB No components found for: TPOAB  Pt describes: - weight loss - intentional 16 lbs weight loss in last 6 mo - eliminated sweets and bread.  He denies: - fatigue - cold intolerance - depression - constipation - dry skin - hair loss  Pt denies feeling nodules in neck, hoarseness, dysphagia/odynophagia, SOB with lying down.  She has no FH of thyroid disorders. No FH of thyroid cancer.  No h/o radiation tx to head or neck. No recent use of iodine supplements.  Pt. also has a history of central adrenal insufficiency:  His adrenal insufficiency was diagnosed in 2018, secondary to metastatic melanoma treatment.     21-hydroxylase antibodies were not elevated:   He is on prednisone 5 mg daily in the morning. He tried 3 and 6  mg >> feels best on 5 mg.  He denies increased fatigue, abdominal pain, weight loss, dizziness.  Of note, he has a history of gastric bypass in 2000 (initial weight : 350 lbs!), HTN.  He also had DI in 2018, but this appears to have been resolved.  Sodium levels have been normal:  Lab Results  Component Value Date   NA 142 08/16/2018   NA 140 01/07/2017   NA 139 11/01/2014   He has nocturia 1-2x a night - takes Flomax.  He moved from [place] to [place] to be near her daughter and her 3 children.  He is very stressed right now is his daughter is going through a difficult divorce.  ROS: Constitutional: + intentional weight loss, no fatigue, no subjective hyperthermia/hypothermia, + see HPI Eyes: no blurry vision, no xerophthalmia ENT: no sore throat, no nodules palpated in throat, no dysphagia/odynophagia, no hoarseness Cardiovascular: no CP/SOB/palpitations/leg swelling Respiratory: no cough/SOB Gastrointestinal: no N/V/D/C Musculoskeletal: no muscle/joint aches Skin: no rashes except developed vitiligo  - hands/arms after immune tx for his melanoma; also easy bruising Neurological: no tremors/numbness/tingling/dizziness Psychiatric: + both: depression/anxiety - controlled + Low libido  Past Medical History:  Diagnosis Date  . B12 nutritional deficiency    malabsorbtion  . BPH (benign prostatic hypertrophy)   . Diverticulosis   . Hypertension   . Iron deficiency anemia    malabsorption related (s/p gastric bypass)  . Melanoma ([place])   . Obesity    s/p gastric bypass 2000, start 355#  .  Pancreatic cyst   . Pulmonary nodules    Past Surgical History:  Procedure Laterality Date  . CRANIOTOMY FOR TUMOR  12/11/2015   metastatic melanoma  . MOHS SURGERY  2012   melanoma, mid back, [place]  . ROUX-EN-Y PROCEDURE  2000  . TOTAL SHOULDER REPLACEMENT Right 2011   [place]   Social History   Socioeconomic History  . Marital status: Married    Spouse name: Not on  file  . Number of children: 2  . Years of education: Not on file  . Highest education level: Not on file  Occupational History  . retired  Tobacco Use  . Smoking status: Former Smoker    Quit date: 02/03/1963    Years since quitting: 56.0  . Smokeless tobacco: Never Used  Substance and Sexual Activity  . Alcohol use: Not on file    Comment: wine 2x/week  . Drug use: Not on file  . Sexual activity: Not on file  Other Topics Concern  . 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 Difficulty of Paying Living Expenses: Not on file  Food Insecurity:   . Worried About Charity fundraiser in the Last Year: Not on file  . Ran Out of Food in the Last Year: Not on file  Transportation Needs:   . Lack of Transportation (Medical): Not on file  . Lack of Transportation (Non-Medical): Not on file  Physical Activity:   . Days of Exercise per Week: Not on file  . Minutes of Exercise per Session: Not on file  Stress:   . Feeling of Stress : Not on file  Social Connections:   . Frequency of Communication with Friends and Family: Not on file  . Frequency of Social Gatherings with Friends and Family: Not on file  . Attends Religious Services: Not on file  . Active Member of Clubs or Organizations: Not on file  . Attends Archivist Meetings: Not on file  . Marital Status: Not on file  Intimate Partner Violence:   . Fear of Current or Ex-Partner: Not on file  . Emotionally Abused: Not on file  . Physically Abused: Not on file  . Sexually Abused: Not on file   Current Outpatient Medications on File Prior to Visit  Medication Sig Dispense Refill  . aspirin 81 MG EC tablet Take by mouth.    . calcium carbonate (OS-CAL - DOSED IN MG OF ELEMENTAL CALCIUM) 1250 (500 Ca) MG tablet Take by mouth.    . calcium gluconate 500 MG tablet Take 1  tablet (500 mg total) by mouth 2 (two) times daily.    . Cholecalciferol (VITAMIN D) 2000 UNITS tablet Take 1 tablet (2,000 Units total) by mouth daily. 30 tablet 11  . clonazePAM (KLONOPIN) 1 MG tablet TAKE 1 TABLET(1 MG) BY MOUTH TWICE DAILY AS NEEDED FOR ANXIETY 60 tablet 0  . escitalopram (LEXAPRO) 20 MG tablet Take 1 tablet (20 mg total) by mouth daily. 90 tablet 1  . levothyroxine (SYNTHROID, LEVOTHROID) 88 MCG tablet Take 88 mcg by mouth daily before breakfast.     . Multiple Vitamins-Minerals (MULTIVITAMIN) tablet Take 1 tablet by mouth daily.    . predniSONE (DELTASONE) 5 MG tablet Take 5 mg by mouth daily with breakfast.    . tamsulosin (FLOMAX) 0.4 MG  CAPS capsule Take 1 capsule (0.4 mg total) by mouth daily after supper. 90 capsule 1   No current facility-administered medications on file prior to visit.   Allergies  Allergen Reactions  . Sertraline     Nausea, fatigue, diarrhea, anorexia, insomnia   Family History  Problem Relation Age of Onset  . Hypertension Father   . Macular degeneration Mother   . Melanoma Mother   . Melanoma Sister   . Breast cancer Sister   . Uterine cancer Sister   . Stomach cancer Sister   . Melanoma Brother    PE: BP 110/60   Pulse (!) 32   Ht 5\' 10"  (1.778 m) Comment: measured without shoes  Wt 188 lb (85.3 kg)   SpO2 99%   BMI 26.98 kg/m  Wt Readings from Last 3 Encounters:  02/17/19 188 lb (85.3 kg)  01/12/19 198 lb 1.3 oz (89.8 kg)  08/16/18 204 lb (92.5 kg)   Constitutional: overweight, in NAD Eyes: PERRLA, EOMI, no exophthalmos ENT: moist mucous membranes, no thyromegaly, no cervical lymphadenopathy Cardiovascular: RRR, No MRG Respiratory: CTA B Gastrointestinal: abdomen soft, NT, ND, BS+ Musculoskeletal: no deformities, strength intact in all 4 Skin: moist, warm, no rashes Neurological: no tremor with outstretched hands, DTR normal in all 4  ASSESSMENT: 1. (Central?) Hypothyroidism  2. Central adrenal  insufficiency  PLAN:  1. Patient with long-standing central hypothyroidism, on levothyroxine therapy. - Possibly related to ipilimumab, but also to pembrolizumab (although this is most likely to cause thyroiditis) - He takes levothyroxine 88 mcg daily - Most recent TSH from 08/2018 was normal - he appears euthyroid.  His pulse is very low today, at 32, but he mentions that he does have bradycardia and his pulse is usually low.  He is not on any medications to slow down his pulse.  He has a cardiologist, with whom he has a new appointment in 04/2019. - he does not appear to have a goiter, thyroid nodules, or neck compression symptoms - We discussed about correct intake of levothyroxine, fasting, with water, separated by at least 30 minutes from breakfast, and separated by more than 4 hours from calcium, iron, multivitamins, acid reflux medications (PPIs).  He is taking it correctly. - will check thyroid tests today: TSH, free T3, free T4, however, we discussed that we most likely would not be able to use the TSH reliably. - If labs today are abnormal, he will need to return in ~6 weeks for repeat labs - Otherwise, I will see him back in 6 months  2.  Central adrenal insufficiency - He continues on prednisone 5 mg daily and has no adrenal insufficiency symptoms - We reviewed sick day rules and advised him when to double the dose of prednisone and when to obtain it parenterally - he does have injectable steroid at home to use as needed  Patient Instructions  Please continue Levothyroxine 88 mcg daily.  Take the thyroid hormone every day, with water, at least 30 minutes before breakfast, separated by at least 4 hours from: - acid reflux medications - calcium - iron - multivitamins  Continue Prednisone 5 mg daily.  Please remember: - You absolutely need to take this medication every day and not skip doses. - Please double the dose if you have a fever, for the duration of the fever. - If  you cannot take anything by mouth (vomiting) or you have severe diarrhea so that you eliminate the Prednisone pills in your stool, please make sure that  you get the Prednisone in the vein instead - go to the nearest emergency department/urgent care or you may go to your PCPs office   Please stop at the lab.  Please come back for a follow-up appointment in 6 months.  Office Visit on 02/17/2019  Component Date Value Ref Range Status  . TSH 02/17/2019 0.53  0.35 - 4.50 uIU/mL Final  . Free T4 02/17/2019 1.36  0.60 - 1.60 ng/dL Final   Comment: Specimens from patients who are undergoing biotin therapy and /or ingesting biotin supplements may contain high levels of biotin.  The higher biotin concentration in these specimens interferes with this Free T4 assay.  Specimens that contain high levels  of biotin may cause false high results for this Free T4 assay.  Please interpret results in light of the total clinical presentation of the patient.    . T3, Free 02/17/2019 2.3  2.3 - 4.2 pg/mL Final   Normal TFTs.  [name], MD PhD [facility] Endocrinology

## 2019-02-17 NOTE — Patient Instructions (Signed)
Please continue Levothyroxine 88 mcg daily.  Take the thyroid hormone every day, with water, at least 30 minutes before breakfast, separated by at least 4 hours from: - acid reflux medications - calcium - iron - multivitamins  Continue Prednisone 5 mg daily.  Please remember: - You absolutely need to take this medication every day and not skip doses. - Please double the dose if you have a fever, for the duration of the fever. - If you cannot take anything by mouth (vomiting) or you have severe diarrhea so that you eliminate the Prednisone pills in your stool, please make sure that you get the Prednisone in the vein instead - go to the nearest emergency department/urgent care or you may go to your PCPs office   Please stop at the lab.  Please come back for a follow-up appointment in 6 months.

## 2019-02-19 ENCOUNTER — Other Ambulatory Visit: Payer: Self-pay | Admitting: Internal Medicine

## 2019-02-20 NOTE — Telephone Encounter (Signed)
Last OV 01/12/19 Next OV NA Last RF 01/02/19

## 2019-03-05 ENCOUNTER — Other Ambulatory Visit: Payer: Self-pay | Admitting: Internal Medicine

## 2019-03-22 DIAGNOSIS — E273 Drug-induced adrenocortical insufficiency: Secondary | ICD-10-CM | POA: Diagnosis not present

## 2019-03-22 DIAGNOSIS — Z79899 Other long term (current) drug therapy: Secondary | ICD-10-CM | POA: Diagnosis not present

## 2019-03-22 DIAGNOSIS — E039 Hypothyroidism, unspecified: Secondary | ICD-10-CM | POA: Diagnosis not present

## 2019-03-22 DIAGNOSIS — C7951 Secondary malignant neoplasm of bone: Secondary | ICD-10-CM | POA: Diagnosis not present

## 2019-03-22 DIAGNOSIS — Z87891 Personal history of nicotine dependence: Secondary | ICD-10-CM | POA: Diagnosis not present

## 2019-03-22 DIAGNOSIS — C439 Malignant melanoma of skin, unspecified: Secondary | ICD-10-CM | POA: Diagnosis not present

## 2019-03-22 DIAGNOSIS — G8929 Other chronic pain: Secondary | ICD-10-CM | POA: Diagnosis not present

## 2019-03-22 DIAGNOSIS — Z9889 Other specified postprocedural states: Secondary | ICD-10-CM | POA: Diagnosis not present

## 2019-03-22 DIAGNOSIS — Z9221 Personal history of antineoplastic chemotherapy: Secondary | ICD-10-CM | POA: Diagnosis not present

## 2019-03-22 DIAGNOSIS — C7931 Secondary malignant neoplasm of brain: Secondary | ICD-10-CM | POA: Diagnosis not present

## 2019-03-22 DIAGNOSIS — C4359 Malignant melanoma of other part of trunk: Secondary | ICD-10-CM | POA: Diagnosis not present

## 2019-03-22 DIAGNOSIS — K869 Disease of pancreas, unspecified: Secondary | ICD-10-CM | POA: Diagnosis not present

## 2019-03-22 DIAGNOSIS — T50905A Adverse effect of unspecified drugs, medicaments and biological substances, initial encounter: Secondary | ICD-10-CM | POA: Diagnosis not present

## 2019-03-22 DIAGNOSIS — L80 Vitiligo: Secondary | ICD-10-CM | POA: Diagnosis not present

## 2019-03-22 DIAGNOSIS — M549 Dorsalgia, unspecified: Secondary | ICD-10-CM | POA: Diagnosis not present

## 2019-03-22 DIAGNOSIS — C799 Secondary malignant neoplasm of unspecified site: Secondary | ICD-10-CM | POA: Diagnosis not present

## 2019-03-31 DIAGNOSIS — Z23 Encounter for immunization: Secondary | ICD-10-CM | POA: Diagnosis not present

## 2019-04-03 DIAGNOSIS — Z9221 Personal history of antineoplastic chemotherapy: Secondary | ICD-10-CM | POA: Diagnosis not present

## 2019-04-03 DIAGNOSIS — Z08 Encounter for follow-up examination after completed treatment for malignant neoplasm: Secondary | ICD-10-CM | POA: Diagnosis not present

## 2019-04-03 DIAGNOSIS — C439 Malignant melanoma of skin, unspecified: Secondary | ICD-10-CM | POA: Diagnosis not present

## 2019-04-03 DIAGNOSIS — C7931 Secondary malignant neoplasm of brain: Secondary | ICD-10-CM | POA: Diagnosis not present

## 2019-04-03 DIAGNOSIS — Z923 Personal history of irradiation: Secondary | ICD-10-CM | POA: Diagnosis not present

## 2019-04-03 DIAGNOSIS — C7951 Secondary malignant neoplasm of bone: Secondary | ICD-10-CM | POA: Diagnosis not present

## 2019-04-03 DIAGNOSIS — Z85820 Personal history of malignant melanoma of skin: Secondary | ICD-10-CM | POA: Diagnosis not present

## 2019-04-07 ENCOUNTER — Encounter: Payer: Self-pay | Admitting: Cardiovascular Disease

## 2019-04-07 ENCOUNTER — Other Ambulatory Visit: Payer: Self-pay

## 2019-04-07 ENCOUNTER — Ambulatory Visit (INDEPENDENT_AMBULATORY_CARE_PROVIDER_SITE_OTHER): Payer: Medicare Other | Admitting: Cardiovascular Disease

## 2019-04-07 VITALS — BP 118/80 | HR 54 | Ht 70.0 in | Wt 188.0 lb

## 2019-04-07 DIAGNOSIS — R001 Bradycardia, unspecified: Secondary | ICD-10-CM

## 2019-04-07 NOTE — Progress Notes (Addendum)
Chief Complaint  Patient presents with  . New Patient (Initial Visit)    Bradycardia    History of Present Illness: 74 yo male with history of BPH, HTN, HLD, iron deficiency anemia, melanoma with brain mets, obesity s/p gastric bypass who is here today as a new patient for further evaluation of bradycardia. He has been followed by Dr. Charolette Forward and saw him last in October 2020. He tells me that he had an episode of dizziness last year in July 2020 and his heart rate was in the 30s. He was seen in the ED and had PVCs with bigeminy. He reports a normal echo and stress test with Dr. Terrence Dupont last year. He has metastatic melanoma and has had treatment at [facility] and is in remission. He had a tumor removal and radiation. He has adrenal insufficiency post immunotherapy and is on daily Prednisone. He exercises every day and has no dizziness, near syncope, syncope, chest pain, dyspnea, LE edema.   Addendum: Records received from Dr. Zenia Resides office on 04/19/19: EKG from 08/18/18 with sinus brady, rate 58 bpm. 1st degree AV block.  Echo 08/30/18: LVEF=50%. Grade 1 diastolic dysfunction. Mild LVH. Mild MR. Mild AS.  Nuclear stress test 08/24/18:No ischemia.    Primary Care Physician: [name], MD   Past Medical History:  Diagnosis Date  . B12 nutritional deficiency    malabsorbtion  . BPH (benign prostatic hypertrophy)   . Diverticulosis   . Hypertension   . Iron deficiency anemia    malabsorption related (s/p gastric bypass)  . Melanoma ([place])   . Obesity    s/p gastric bypass 2000, start 355#  . Pancreatic cyst   . Pulmonary nodules     Past Surgical History:  Procedure Laterality Date  . CRANIOTOMY FOR TUMOR  12/11/2015   metastatic melanoma  . MOHS SURGERY  2012   melanoma, mid back, [place]  . ROUX-EN-Y PROCEDURE  2000  . TOTAL SHOULDER REPLACEMENT Right 2011   [place]    Current Outpatient Medications  Medication Sig Dispense Refill  . calcium carbonate (OS-CAL -  DOSED IN MG OF ELEMENTAL CALCIUM) 1250 (500 Ca) MG tablet Take by mouth.    . calcium gluconate 500 MG tablet Take 1 tablet (500 mg total) by mouth 2 (two) times daily.    . Cholecalciferol (VITAMIN D) 2000 UNITS tablet Take 1 tablet (2,000 Units total) by mouth daily. 30 tablet 11  . clonazePAM (KLONOPIN) 1 MG tablet TAKE 1 TABLET(1 MG) BY MOUTH TWICE DAILY AS NEEDED FOR ANXIETY 60 tablet 0  . escitalopram (LEXAPRO) 10 MG tablet TAKE 1 TABLET(10 MG) BY MOUTH DAILY 30 tablet 5  . levothyroxine (SYNTHROID, LEVOTHROID) 88 MCG tablet Take 88 mcg by mouth daily before breakfast.     . Multiple Vitamins-Minerals (MULTIVITAMIN) tablet Take 1 tablet by mouth daily.    . predniSONE (DELTASONE) 5 MG tablet Take 5 mg by mouth daily with breakfast.    . tamsulosin (FLOMAX) 0.4 MG CAPS capsule Take 1 capsule (0.4 mg total) by mouth daily after supper. 90 capsule 1   No current facility-administered medications for this visit.    Allergies  Allergen Reactions  . Sertraline     Nausea, fatigue, diarrhea, anorexia, insomnia    Social History   Socioeconomic History  . Marital status: Married    Spouse name: Not on file  . Number of children: 2  . Years of education: Not on file  . Highest education level: Not on file  Occupational History  . Not on file  Tobacco Use  . Smoking status: Former Smoker    Quit date: 02/03/1963    Years since quitting: 56.2  . Smokeless tobacco: Never Used  Substance and Sexual Activity  . Alcohol use: Not on file    Comment: wine 2x/week  . Drug use: Not on file  . Sexual activity: Not on file  Other Topics Concern  . 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 Difficulty of Paying Living Expenses: Not on file  Food Insecurity:   . Worried About Charity fundraiser in the Last Year: Not on file  . Ran Out  of Food in the Last Year: Not on file  Transportation Needs:   . Lack of Transportation (Medical): Not on file  . Lack of Transportation (Non-Medical): Not on file  Physical Activity:   . Days of Exercise per Week: Not on file  . Minutes of Exercise per Session: Not on file  Stress:   . Feeling of Stress : Not on file  Social Connections:   . Frequency of Communication with Friends and Family: Not on file  . Frequency of Social Gatherings with Friends and Family: Not on file  . Attends Religious Services: Not on file  . Active Member of Clubs or Organizations: Not on file  . Attends Archivist Meetings: Not on file  . Marital Status: Not on file  Intimate Partner Violence:   . Fear of Current or Ex-Partner: Not on file  . Emotionally Abused: Not on file  . Physically Abused: Not on file  . Sexually Abused: Not on file    Family History  Problem Relation Age of Onset  . Hypertension Father   . Macular degeneration Mother   . Melanoma Mother   . Melanoma Sister   . Breast cancer Sister   . Uterine cancer Sister   . Stomach cancer Sister   . Melanoma Brother     Review of Systems:  As stated in the HPI and otherwise negative.   BP 118/80   Pulse (!) 54   Ht 5\' 10"  (1.778 m)   Wt 188 lb (85.3 kg)   SpO2 99%   BMI 26.98 kg/m   Physical Examination: General: Well developed, well nourished, NAD  HEENT: OP clear, mucus membranes moist  SKIN: warm, dry. No rashes. Neuro: No focal deficits  Musculoskeletal: Muscle strength 5/5 all ext  Psychiatric: Mood and affect normal  Neck: No JVD, no carotid bruits, no thyromegaly, no lymphadenopathy.  Lungs:Clear bilaterally, no wheezes, rhonci, crackles Cardiovascular: Regular rate and rhythm. No murmurs, gallops or rubs. Abdomen:Soft. Bowel sounds present. Non-tender.  Extremities: No lower extremity edema. Pulses are 2 + in the bilateral DP/PT.  EKG:  EKG is ordered today. The ekg ordered today demonstrates Sinus  bradycardia, rate 54 bpm  Recent Labs: 08/16/2018: ALT 31; BUN 19; Creatinine, Ser 1.01; Hemoglobin 13.4; Magnesium 2.3; Platelets 147; Potassium 4.0; Sodium 142 02/17/2019: TSH 0.53   Lipid Panel    Component Value Date/Time   CHOL 195 01/07/2017 1034   TRIG 152.0 (H) 01/07/2017 1034   HDL 73.20 01/07/2017 1034   CHOLHDL 3 01/07/2017 1034   VLDL 30.4 01/07/2017 1034   LDLCALC 91 01/07/2017 1034     Wt Readings from Last  3 Encounters:  04/07/19 188 lb (85.3 kg)  02/17/19 188 lb (85.3 kg)  01/12/19 198 lb 1.3 oz (89.8 kg)    Assessment and Plan:   1. Sinus bradycardia: He feels great. He has no dizziness. I will get records from Dr. Terrence Dupont and review his echo and nuclear stress test.   See addendum above with info from records  Current medicines are reviewed at length with the patient today.  The patient does not have concerns regarding medicines.  The following changes have been made:  no change  Labs/ tests ordered today include:   Orders Placed This Encounter  Procedures  . EKG 12-Lead     Disposition:   FU with me in 6 months.    Signed, Lauree Chandler, MD 04/07/2019 9:48 AM    [address], [place], [place]  [address] Phone: [phone]; Fax: [phone]

## 2019-04-07 NOTE — Patient Instructions (Signed)
Medication Instructions:  No changes *If you need a refill on your cardiac medications before your next appointment, please call your pharmacy*   Lab Work: None  Testing/Procedures: none   Follow-Up: At [facility], you and your health needs are our priority.  As part of our continuing mission to provide you with exceptional heart care, we have created designated Provider Care Teams.  These Care Teams include your primary Cardiologist (physician) and Advanced Practice Providers (APPs -  Physician Assistants and Nurse Practitioners) who all work together to provide you with the care you need, when you need it.  Your next appointment:   6 month(s)  The format for your next appointment:   In Person  Provider:   Lauree Chandler, MD   Other Instructions Release of information signed today for records from Dr. Terrence Dupont.

## 2019-04-10 ENCOUNTER — Other Ambulatory Visit: Payer: Self-pay | Admitting: Internal Medicine

## 2019-04-10 ENCOUNTER — Telehealth: Payer: Self-pay | Admitting: Cardiovascular Disease

## 2019-04-10 DIAGNOSIS — Z85820 Personal history of malignant melanoma of skin: Secondary | ICD-10-CM | POA: Diagnosis not present

## 2019-04-10 DIAGNOSIS — D1801 Hemangioma of skin and subcutaneous tissue: Secondary | ICD-10-CM | POA: Diagnosis not present

## 2019-04-10 DIAGNOSIS — L814 Other melanin hyperpigmentation: Secondary | ICD-10-CM | POA: Diagnosis not present

## 2019-04-10 DIAGNOSIS — L821 Other seborrheic keratosis: Secondary | ICD-10-CM | POA: Diagnosis not present

## 2019-04-10 DIAGNOSIS — L905 Scar conditions and fibrosis of skin: Secondary | ICD-10-CM | POA: Diagnosis not present

## 2019-04-10 DIAGNOSIS — D225 Melanocytic nevi of trunk: Secondary | ICD-10-CM | POA: Diagnosis not present

## 2019-04-10 NOTE — Telephone Encounter (Signed)
Medical records requested from Charolette Forward, MD ([facility]). 04/10/19 vlm

## 2019-04-10 NOTE — Telephone Encounter (Signed)
Last OV 01/12/19 Next OV NA Last RF 02/20/19

## 2019-04-20 ENCOUNTER — Encounter: Payer: Self-pay | Admitting: Internal Medicine

## 2019-04-20 ENCOUNTER — Other Ambulatory Visit: Payer: Self-pay | Admitting: Internal Medicine

## 2019-04-20 MED ORDER — PREDNISONE 5 MG PO TABS: 5.0000 mg | ORAL_TABLET | Freq: Every day | ORAL | 3 refills | Status: DC

## 2019-04-28 DIAGNOSIS — Z23 Encounter for immunization: Secondary | ICD-10-CM | POA: Diagnosis not present

## 2019-05-19 DIAGNOSIS — Z961 Presence of intraocular lens: Secondary | ICD-10-CM | POA: Diagnosis not present

## 2019-05-19 DIAGNOSIS — H524 Presbyopia: Secondary | ICD-10-CM | POA: Diagnosis not present

## 2019-05-25 ENCOUNTER — Other Ambulatory Visit: Payer: Self-pay | Admitting: Internal Medicine

## 2019-05-29 ENCOUNTER — Other Ambulatory Visit: Payer: Self-pay | Admitting: Internal Medicine

## 2019-05-29 DIAGNOSIS — G9389 Other specified disorders of brain: Secondary | ICD-10-CM | POA: Diagnosis not present

## 2019-05-29 DIAGNOSIS — C7951 Secondary malignant neoplasm of bone: Secondary | ICD-10-CM | POA: Diagnosis not present

## 2019-05-29 DIAGNOSIS — Z08 Encounter for follow-up examination after completed treatment for malignant neoplasm: Secondary | ICD-10-CM | POA: Diagnosis not present

## 2019-05-29 DIAGNOSIS — Z923 Personal history of irradiation: Secondary | ICD-10-CM | POA: Diagnosis not present

## 2019-05-29 DIAGNOSIS — C801 Malignant (primary) neoplasm, unspecified: Secondary | ICD-10-CM | POA: Diagnosis not present

## 2019-05-29 DIAGNOSIS — C7931 Secondary malignant neoplasm of brain: Secondary | ICD-10-CM | POA: Diagnosis not present

## 2019-05-29 DIAGNOSIS — Z85820 Personal history of malignant melanoma of skin: Secondary | ICD-10-CM | POA: Diagnosis not present

## 2019-05-30 NOTE — Telephone Encounter (Signed)
Check [place] registry last filled 04/10/2019../lmb  

## 2019-06-26 ENCOUNTER — Other Ambulatory Visit: Payer: Self-pay | Admitting: Internal Medicine

## 2019-06-26 ENCOUNTER — Encounter: Payer: Self-pay | Admitting: Internal Medicine

## 2019-06-26 MED ORDER — LEVOTHYROXINE SODIUM 88 MCG PO TABS: 88.0000 ug | ORAL_TABLET | Freq: Every day | ORAL | 3 refills | Status: DC

## 2019-06-26 MED ORDER — PREDNISONE 5 MG PO TABS: 5.0000 mg | ORAL_TABLET | Freq: Every day | ORAL | 3 refills | Status: DC

## 2019-07-24 ENCOUNTER — Telehealth: Payer: Self-pay

## 2019-07-24 MED ORDER — CLONAZEPAM 1 MG PO TABS: ORAL_TABLET | ORAL | 0 refills | Status: DC

## 2019-07-24 NOTE — Addendum Note (Signed)
Addended by: [name] on: 07/24/2019 03:13 PM   Modules accepted: Orders

## 2019-07-25 ENCOUNTER — Other Ambulatory Visit: Payer: Self-pay | Admitting: Internal Medicine

## 2019-08-18 ENCOUNTER — Other Ambulatory Visit: Payer: Self-pay

## 2019-08-18 ENCOUNTER — Ambulatory Visit (INDEPENDENT_AMBULATORY_CARE_PROVIDER_SITE_OTHER): Payer: Medicare Other | Admitting: Internal Medicine

## 2019-08-18 ENCOUNTER — Encounter: Payer: Self-pay | Admitting: Internal Medicine

## 2019-08-18 VITALS — BP 120/60 | HR 64 | Ht 70.0 in | Wt 196.0 lb

## 2019-08-18 DIAGNOSIS — E038 Other specified hypothyroidism: Secondary | ICD-10-CM | POA: Diagnosis not present

## 2019-08-18 DIAGNOSIS — E273 Drug-induced adrenocortical insufficiency: Secondary | ICD-10-CM

## 2019-08-18 LAB — TSH: TSH: 0.53 u[IU]/mL (ref 0.35–4.50)

## 2019-08-18 LAB — T4, FREE: Free T4: 0.96 ng/dL (ref 0.60–1.60)

## 2019-08-18 NOTE — Patient Instructions (Addendum)
Please continue Levothyroxine 88 mcg daily.  Take the thyroid hormone every day, with water, at least 30 minutes before breakfast, separated by at least 4 hours from: - acid reflux medications - calcium - iron - multivitamins  Continue prednisone 5 mg daily.  Please remember: - You absolutely need to take this medication every day and not skip doses. - Please double the dose if you have a fever, for the duration of the fever. - If you cannot take anything by mouth (vomiting) or you have severe diarrhea so that you eliminate the Prednisone pills in your stool, please make sure that you get the Prednisone in the vein instead - go to the nearest emergency department/urgent care or you may go to your PCPs office   Please stop at the lab.  Please come back for a follow-up appointment in 1 year.

## 2019-08-18 NOTE — Progress Notes (Signed)
Patient ID: Maxwell Fox, male   DOB: Feb 28, 1945, 74 y.o.   MRN: [id number]   This visit occurred during the SARS-CoV-2 public health emergency.  Safety protocols were in place, including screening questions prior to the visit, additional usage of staff PPE, and extensive cleaning of exam room while observing appropriate contact time as indicated for disinfecting solutions.   HPI  Maxwell Fox is a 74 y.o.-year-old male, referred by his PCP, Dr. Drema Dallas, for management of central adrenal insufficiency and central hypothyroidism.  Last visit 6 months ago. Prev. Saw Dr. Dara Lords at [facility]  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, brain (pembrolizumab, ipilimumab)  He is on levothyroxine 88 mcg daily: - in am - fasting - at least 60 min from b'fast - no Fe, PPIs - + MVI and calcium at 4 PM  - not on Biotin  Reviewed patient's TFTs: Lab Results  Component Value Date   TSH 0.53 02/17/2019   TSH 0.550 08/16/2018   TSH 2.39 11/01/2014   FREET4 1.36 02/17/2019   T3FREE 2.3 02/17/2019   Reviewed records from [facility] Everywhere):   Antithyroid antibodies: No results found for: THGAB No components found for: TPOAB  At last visit he had intentional weight loss of 16 pounds in the previous 6 months after eliminating sweets and bread.  However, he gained 8 pounds back since then.  Pt denies: - feeling nodules in neck - hoarseness - dysphagia - choking - SOB with lying down  She has no FH of thyroid disorders. No FH of thyroid cancer. No h/o radiation tx to head or neck.  No herbal supplements. No Biotin use. No recent steroids use.   Central adrenal insufficiency:  His adrenal insufficiency was diagnosed in 2018, secondary to metastatic melanoma treatment.    21-hydroxylase antibodies were not elevated:   He is on prednisone 5 mg daily in the morning. He tried 3 and 6 mg >> he feels  best on 5 mg.  He denies increased fatigue, abdominal pain, weight loss, dizziness.  Of note, he has a history of R en Y gastric bypass in 2000 (initial weight : 350 lbs!),  HTN.  He also had a TIA in 2018, but this appears to have resolved.  Sodium levels were reviewed and these were normal:  Lab Results  Component Value Date   NA 142 08/16/2018   NA 140 01/07/2017   NA 139 11/01/2014   He has nocturia 1-2x a night -he takes Flomax.  He moved from [place] to [place] to be near her daughter and her 3 children.  He is very stressed right now is his daughter is going through a difficult divorce.  ROS: Constitutional: + weight gain/no weight loss, no fatigue, no subjective hyperthermia, no subjective hypothermia Eyes: no blurry vision, no xerophthalmia ENT: no sore throat, no nodules palpated in neck, no dysphagia, no odynophagia, no hoarseness Cardiovascular: no CP/no SOB/no palpitations/no leg swelling Respiratory: no cough/no SOB/no wheezing Gastrointestinal: no N/no V/no D/no C/no acid reflux Musculoskeletal: no muscle aches/no joint aches Skin: no hair loss, + vitiligo hands and arms after immune treatment for his melanoma, also + easy bruising Neurological: no tremors/no numbness/no tingling/no dizziness  I reviewed pt's medications, allergies, PMH, social hx, family hx, and changes were documented in the history of present illness. Otherwise, unchanged from my initial visit note.  Past Medical History:  Diagnosis Date  . B12 nutritional deficiency  malabsorbtion  . BPH (benign prostatic hypertrophy)   . Diverticulosis   . Hypertension   . Iron deficiency anemia    malabsorption related (s/p gastric bypass)  . Melanoma ([place])   . Obesity    s/p gastric bypass 2000, start 355#  . Pancreatic cyst   . Pulmonary nodules    Past Surgical History:  Procedure Laterality Date  . CRANIOTOMY FOR TUMOR  12/11/2015   metastatic melanoma  . MOHS SURGERY  2012   melanoma,  mid back, [place]  . ROUX-EN-Y PROCEDURE  2000  . TOTAL SHOULDER REPLACEMENT Right 2011   [place]   Social History   Socioeconomic History  . Marital status: Married    Spouse name: Not on file  . Number of children: 2  . Years of education: Not on file  . Highest education level: Not on file  Occupational History  . retired  Tobacco Use  . Smoking status: Former Smoker    Quit date: 02/03/1963    Years since quitting: 56.0  . Smokeless tobacco: Never Used  Substance and Sexual Activity  . Alcohol use: Not on file    Comment: wine 2x/week  . Drug use: Not on file  . Sexual activity: Not on file  Other Topics Concern  . 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 Difficulty of Paying Living Expenses: Not on file  Food Insecurity:   . Worried About Charity fundraiser in the Last Year: Not on file  . Ran Out of Food in the Last Year: Not on file  Transportation Needs:   . Lack of Transportation (Medical): Not on file  . Lack of Transportation (Non-Medical): Not on file  Physical Activity:   . Days of Exercise per Week: Not on file  . Minutes of Exercise per Session: Not on file  Stress:   . Feeling of Stress : Not on file  Social Connections:   . Frequency of Communication with Friends and Family: Not on file  . Frequency of Social Gatherings with Friends and Family: Not on file  . Attends Religious Services: Not on file  . Active Member of Clubs or Organizations: Not on file  . Attends Archivist Meetings: Not on file  . Marital Status: Not on file  Intimate Partner Violence:   . Fear of Current or Ex-Partner: Not on file  . Emotionally Abused: Not on file  . Physically Abused: Not on file  . Sexually Abused: Not on file   Current Outpatient Medications on File Prior to Visit  Medication Sig Dispense  Refill  . calcium carbonate (OS-CAL - DOSED IN MG OF ELEMENTAL CALCIUM) 1250 (500 Ca) MG tablet Take by mouth.    . calcium gluconate 500 MG tablet Take 1 tablet (500 mg total) by mouth 2 (two) times daily.    . Cholecalciferol (VITAMIN D) 2000 UNITS tablet Take 1 tablet (2,000 Units total) by mouth daily. 30 tablet 11  . clonazePAM (KLONOPIN) 1 MG tablet TAKE 1 TABLET(1 MG) BY MOUTH TWICE DAILY AS NEEDED FOR ANXIETY 60 tablet 0  . escitalopram (LEXAPRO) 10 MG tablet TAKE 1 TABLET(10 MG) BY MOUTH DAILY 30 tablet 5  . levothyroxine (SYNTHROID) 88 MCG tablet Take 1 tablet (88 mcg total) by mouth daily before breakfast. 90 tablet 3  .  Multiple Vitamins-Minerals (MULTIVITAMIN) tablet Take 1 tablet by mouth daily.    . predniSONE (DELTASONE) 5 MG tablet Take 1 tablet (5 mg total) by mouth daily with breakfast. 120 tablet 3  . tamsulosin (FLOMAX) 0.4 MG CAPS capsule TAKE 1 CAPSULE(0.4 MG) BY MOUTH DAILY AFTER SUPPER 90 capsule 1   No current facility-administered medications on file prior to visit.   Allergies  Allergen Reactions  . Sertraline     Nausea, fatigue, diarrhea, anorexia, insomnia   Family History  Problem Relation Age of Onset  . Hypertension Father   . Macular degeneration Mother   . Melanoma Mother   . Melanoma Sister   . Breast cancer Sister   . Uterine cancer Sister   . Stomach cancer Sister   . Melanoma Brother    PE: BP 120/60   Pulse 64   Ht 5\' 10"  (1.778 m)   Wt 196 lb (88.9 kg)   SpO2 98%   BMI 28.12 kg/m  Wt Readings from Last 3 Encounters:  08/18/19 196 lb (88.9 kg)  04/07/19 188 lb (85.3 kg)  02/17/19 188 lb (85.3 kg)   Constitutional: overweight, in NAD Eyes: PERRLA, EOMI, no exophthalmos ENT: moist mucous membranes, no thyromegaly, no cervical lymphadenopathy Cardiovascular: RRR, No MRG Respiratory: CTA B Gastrointestinal: abdomen soft, NT, ND, BS+ Musculoskeletal: no deformities, strength intact in all 4 Skin: moist, warm, no  rashes Neurological: no tremor with outstretched hands, DTR normal in all 4  ASSESSMENT: 1. Central Hypothyroidism  2. Central adrenal insufficiency  PLAN:  1. Patient with  central hypothyroidism on levothyroxine treatment -Possibly related to ipilimumab, but also pembrolizumab (although this is most likely to cause thyroiditis) - latest thyroid labs reviewed with pt >> normal free T4 (we discussed that TSH is usually not completely reliable in central hypothyroidism)  - he continues on LT4 88 mcg daily - pt feels good on this dose complaints.  He did gain 8 pounds since last visit. - we discussed about taking the thyroid hormone every day, with water, >30 minutes before breakfast, separated by >4 hours from acid reflux medications, calcium, iron, multivitamins. Pt. is taking it correctly. - will check thyroid tests today: TSH and fT4 - If labs are abnormal, he will need to return for repeat TFTs in 1.5 months - Otherwise, I will see him back in 1 year  2.  Central adrenal insufficiency -He continues on prednisone 5 mg daily  -He denies adrenal insufficiency symptoms -We again reviewed sick day rules and advised him when to double the dose of prednisone and went obtaining parenterally -He does have steroid solution at home to inject if needed  Patient Instructions  Please continue Levothyroxine 88 mcg daily.  Take the thyroid hormone every day, with water, at least 30 minutes before breakfast, separated by at least 4 hours from: - acid reflux medications - calcium - iron - multivitamins  Continue prednisone 5 mg daily.  Please remember: - You absolutely need to take this medication every day and not skip doses. - Please double the dose if you have a fever, for the duration of the fever. - If you cannot take anything by mouth (vomiting) or you have severe diarrhea so that you eliminate the Prednisone pills in your stool, please make sure that you get the Prednisone in the vein  instead - go to the nearest emergency department/urgent care or you may go to your PCPs office   Please stop at the lab.  Please come back for  a follow-up appointment in 1 year.  Component     Latest Ref Rng & Units 08/18/2019  TSH     0.35 - 4.50 uIU/mL 0.53  T4,Free(Direct)     0.60 - 1.60 ng/dL 0.96  Normal TFTs.  [name], MD PhD [facility]

## 2019-09-24 ENCOUNTER — Other Ambulatory Visit: Payer: Self-pay | Admitting: Internal Medicine

## 2019-09-26 DIAGNOSIS — Z9884 Bariatric surgery status: Secondary | ICD-10-CM | POA: Diagnosis not present

## 2019-09-26 DIAGNOSIS — Z87891 Personal history of nicotine dependence: Secondary | ICD-10-CM | POA: Diagnosis not present

## 2019-09-26 DIAGNOSIS — C799 Secondary malignant neoplasm of unspecified site: Secondary | ICD-10-CM | POA: Diagnosis not present

## 2019-09-26 DIAGNOSIS — C801 Malignant (primary) neoplasm, unspecified: Secondary | ICD-10-CM | POA: Diagnosis not present

## 2019-09-26 DIAGNOSIS — C4359 Malignant melanoma of other part of trunk: Secondary | ICD-10-CM | POA: Diagnosis not present

## 2019-09-26 DIAGNOSIS — K862 Cyst of pancreas: Secondary | ICD-10-CM | POA: Diagnosis not present

## 2019-09-26 DIAGNOSIS — Z79899 Other long term (current) drug therapy: Secondary | ICD-10-CM | POA: Diagnosis not present

## 2019-09-26 DIAGNOSIS — E039 Hypothyroidism, unspecified: Secondary | ICD-10-CM | POA: Diagnosis not present

## 2019-09-26 DIAGNOSIS — C7931 Secondary malignant neoplasm of brain: Secondary | ICD-10-CM | POA: Diagnosis not present

## 2019-09-28 DIAGNOSIS — C7931 Secondary malignant neoplasm of brain: Secondary | ICD-10-CM | POA: Diagnosis not present

## 2019-09-29 ENCOUNTER — Telehealth: Payer: Self-pay | Admitting: Internal Medicine

## 2019-09-29 NOTE — Progress Notes (Signed)
  Chronic Care Management   Outreach Note  09/29/2019 Name: Maxwell Fox MRN: [id number] DOB: 05/29/1945  Referred by: [name], MD Reason for referral : No chief complaint on file.   An unsuccessful telephone outreach was attempted today. The patient was referred to the pharmacist for assistance with care management and care coordination.   Follow Up Plan:   Earney Hamburg Upstream Scheduler

## 2019-10-05 ENCOUNTER — Telehealth: Payer: Self-pay | Admitting: Internal Medicine

## 2019-10-05 NOTE — Progress Notes (Signed)
°  Chronic Care Management   Outreach Note  10/05/2019 Name: Maxwell Fox MRN: [id number] DOB: 1946/01/18  Referred by: [name], MD Reason for referral : No chief complaint on file.   An unsuccessful telephone outreach was attempted today. The patient was referred to the pharmacist for assistance with care management and care coordination.   Follow Up Plan:   Earney Hamburg Upstream Scheduler

## 2019-10-12 ENCOUNTER — Telehealth: Payer: Self-pay | Admitting: Internal Medicine

## 2019-10-12 NOTE — Progress Notes (Signed)
  Chronic Care Management   Outreach Note  10/12/2019 Name: Tajay Muzzy MRN: [id number] DOB: Apr 24, 1945  Referred by: [name], MD Reason for referral : No chief complaint on file.   Third unsuccessful telephone outreach was attempted today. The patient was referred to the pharmacist for assistance with care management and care coordination.   Follow Up Plan:   Carley Perdue UpStream Scheduler

## 2019-11-13 DIAGNOSIS — Z23 Encounter for immunization: Secondary | ICD-10-CM | POA: Diagnosis not present

## 2019-11-23 ENCOUNTER — Encounter: Payer: Self-pay | Admitting: Internal Medicine

## 2019-11-23 MED ORDER — ESCITALOPRAM OXALATE 10 MG PO TABS: ORAL_TABLET | ORAL | 0 refills | Status: DC

## 2019-11-30 ENCOUNTER — Other Ambulatory Visit: Payer: Self-pay | Admitting: Internal Medicine

## 2019-12-22 DIAGNOSIS — L821 Other seborrheic keratosis: Secondary | ICD-10-CM | POA: Diagnosis not present

## 2019-12-22 DIAGNOSIS — L905 Scar conditions and fibrosis of skin: Secondary | ICD-10-CM | POA: Diagnosis not present

## 2019-12-22 DIAGNOSIS — Z85820 Personal history of malignant melanoma of skin: Secondary | ICD-10-CM | POA: Diagnosis not present

## 2019-12-22 DIAGNOSIS — L814 Other melanin hyperpigmentation: Secondary | ICD-10-CM | POA: Diagnosis not present

## 2019-12-22 DIAGNOSIS — D225 Melanocytic nevi of trunk: Secondary | ICD-10-CM | POA: Diagnosis not present

## 2020-01-02 ENCOUNTER — Other Ambulatory Visit: Payer: Self-pay | Admitting: Internal Medicine

## 2020-01-17 ENCOUNTER — Other Ambulatory Visit: Payer: Self-pay

## 2020-01-17 ENCOUNTER — Encounter (HOSPITAL_COMMUNITY): Payer: Self-pay

## 2020-01-17 ENCOUNTER — Emergency Department (HOSPITAL_COMMUNITY): Payer: Medicare Other

## 2020-01-17 ENCOUNTER — Inpatient Hospital Stay (HOSPITAL_COMMUNITY)
Admission: EM | Admit: 2020-01-17 | Discharge: 2020-01-22 | DRG: 536 | Disposition: A | Discharge status: Skilled Nursing Facility | Payer: Medicare Other | Attending: Family Medicine | Admitting: Family Medicine

## 2020-01-17 DIAGNOSIS — W19XXXA Unspecified fall, initial encounter: Secondary | ICD-10-CM | POA: Diagnosis not present

## 2020-01-17 DIAGNOSIS — Z87891 Personal history of nicotine dependence: Secondary | ICD-10-CM

## 2020-01-17 DIAGNOSIS — I10 Essential (primary) hypertension: Secondary | ICD-10-CM | POA: Diagnosis present

## 2020-01-17 DIAGNOSIS — N400 Enlarged prostate without lower urinary tract symptoms: Secondary | ICD-10-CM | POA: Diagnosis not present

## 2020-01-17 DIAGNOSIS — E2740 Unspecified adrenocortical insufficiency: Secondary | ICD-10-CM | POA: Diagnosis present

## 2020-01-17 DIAGNOSIS — Z9884 Bariatric surgery status: Secondary | ICD-10-CM

## 2020-01-17 DIAGNOSIS — Z808 Family history of malignant neoplasm of other organs or systems: Secondary | ICD-10-CM

## 2020-01-17 DIAGNOSIS — Z85820 Personal history of malignant melanoma of skin: Secondary | ICD-10-CM

## 2020-01-17 DIAGNOSIS — Y92012 Bathroom of single-family (private) house as the place of occurrence of the external cause: Secondary | ICD-10-CM

## 2020-01-17 DIAGNOSIS — S32512A Fracture of superior rim of left pubis, initial encounter for closed fracture: Secondary | ICD-10-CM | POA: Diagnosis not present

## 2020-01-17 DIAGNOSIS — Z7952 Long term (current) use of systemic steroids: Secondary | ICD-10-CM

## 2020-01-17 DIAGNOSIS — K5790 Diverticulosis of intestine, part unspecified, without perforation or abscess without bleeding: Secondary | ICD-10-CM | POA: Diagnosis present

## 2020-01-17 DIAGNOSIS — Z888 Allergy status to other drugs, medicaments and biological substances status: Secondary | ICD-10-CM

## 2020-01-17 DIAGNOSIS — S0990XA Unspecified injury of head, initial encounter: Secondary | ICD-10-CM | POA: Diagnosis not present

## 2020-01-17 DIAGNOSIS — S32810A Multiple fractures of pelvis with stable disruption of pelvic ring, initial encounter for closed fracture: Secondary | ICD-10-CM

## 2020-01-17 DIAGNOSIS — E039 Hypothyroidism, unspecified: Secondary | ICD-10-CM | POA: Diagnosis present

## 2020-01-17 DIAGNOSIS — Z79899 Other long term (current) drug therapy: Secondary | ICD-10-CM

## 2020-01-17 DIAGNOSIS — R52 Pain, unspecified: Secondary | ICD-10-CM | POA: Diagnosis not present

## 2020-01-17 DIAGNOSIS — W010XXA Fall on same level from slipping, tripping and stumbling without subsequent striking against object, initial encounter: Secondary | ICD-10-CM | POA: Diagnosis present

## 2020-01-17 DIAGNOSIS — S32592A Other specified fracture of left pubis, initial encounter for closed fracture: Principal | ICD-10-CM | POA: Diagnosis present

## 2020-01-17 DIAGNOSIS — S72002A Fracture of unspecified part of neck of left femur, initial encounter for closed fracture: Secondary | ICD-10-CM | POA: Diagnosis not present

## 2020-01-17 DIAGNOSIS — R0602 Shortness of breath: Secondary | ICD-10-CM

## 2020-01-17 DIAGNOSIS — S72009A Fracture of unspecified part of neck of unspecified femur, initial encounter for closed fracture: Secondary | ICD-10-CM | POA: Diagnosis not present

## 2020-01-17 DIAGNOSIS — Z8249 Family history of ischemic heart disease and other diseases of the circulatory system: Secondary | ICD-10-CM

## 2020-01-17 DIAGNOSIS — S79912A Unspecified injury of left hip, initial encounter: Secondary | ICD-10-CM | POA: Diagnosis not present

## 2020-01-17 DIAGNOSIS — Z20822 Contact with and (suspected) exposure to covid-19: Secondary | ICD-10-CM | POA: Diagnosis not present

## 2020-01-17 DIAGNOSIS — D649 Anemia, unspecified: Secondary | ICD-10-CM | POA: Diagnosis present

## 2020-01-17 DIAGNOSIS — I700 Atherosclerosis of aorta: Secondary | ICD-10-CM | POA: Diagnosis not present

## 2020-01-17 DIAGNOSIS — F419 Anxiety disorder, unspecified: Secondary | ICD-10-CM | POA: Diagnosis present

## 2020-01-17 DIAGNOSIS — Z79890 Hormone replacement therapy (postmenopausal): Secondary | ICD-10-CM

## 2020-01-17 DIAGNOSIS — M25552 Pain in left hip: Secondary | ICD-10-CM | POA: Diagnosis not present

## 2020-01-17 LAB — CBC WITH DIFFERENTIAL/PLATELET
Abs Immature Granulocytes: 0.05 10*3/uL (ref 0.00–0.07)
Basophils Absolute: 0.1 10*3/uL (ref 0.0–0.1)
Basophils Relative: 1 %
Eosinophils Absolute: 0.1 10*3/uL (ref 0.0–0.5)
Eosinophils Relative: 1 %
HCT: 38.6 % — ABNORMAL LOW (ref 39.0–52.0)
Hemoglobin: 12.3 g/dL — ABNORMAL LOW (ref 13.0–17.0)
Immature Granulocytes: 1 %
Lymphocytes Relative: 17 %
Lymphs Abs: 1.4 10*3/uL (ref 0.7–4.0)
MCH: 29.3 pg (ref 26.0–34.0)
MCHC: 31.9 g/dL (ref 30.0–36.0)
MCV: 91.9 fL (ref 80.0–100.0)
Monocytes Absolute: 0.6 10*3/uL (ref 0.1–1.0)
Monocytes Relative: 7 %
Neutro Abs: 6.2 10*3/uL (ref 1.7–7.7)
Neutrophils Relative %: 73 %
Platelets: 118 10*3/uL — ABNORMAL LOW (ref 150–400)
RBC: 4.2 MIL/uL — ABNORMAL LOW (ref 4.22–5.81)
RDW: 14.3 % (ref 11.5–15.5)
WBC: 8.4 10*3/uL (ref 4.0–10.5)
nRBC: 0 % (ref 0.0–0.2)

## 2020-01-17 LAB — COMPREHENSIVE METABOLIC PANEL
ALT: 20 U/L (ref 0–44)
AST: 28 U/L (ref 15–41)
Albumin: 3.2 g/dL — ABNORMAL LOW (ref 3.5–5.0)
Alkaline Phosphatase: 45 U/L (ref 38–126)
Anion gap: 8 (ref 5–15)
BUN: 18 mg/dL (ref 8–23)
CO2: 28 mmol/L (ref 22–32)
Calcium: 9.3 mg/dL (ref 8.9–10.3)
Chloride: 107 mmol/L (ref 98–111)
Creatinine, Ser: 0.76 mg/dL (ref 0.61–1.24)
GFR, Estimated: >60 mL/min (ref >60)
Glucose, Bld: 86 mg/dL (ref 70–99)
Potassium: 4.1 mmol/L (ref 3.5–5.1)
Sodium: 143 mmol/L (ref 135–145)
Total Bilirubin: 0.7 mg/dL (ref 0.3–1.2)
Total Protein: 5.5 g/dL — ABNORMAL LOW (ref 6.5–8.1)

## 2020-01-17 LAB — URINALYSIS, ROUTINE W REFLEX MICROSCOPIC
Bacteria, UA: NONE SEEN
Bilirubin Urine: NEGATIVE
Glucose, UA: NEGATIVE mg/dL
Ketones, ur: NEGATIVE mg/dL
Leukocytes,Ua: NEGATIVE
Nitrite: NEGATIVE
Protein, ur: NEGATIVE mg/dL
Specific Gravity, Urine: 1.012 (ref 1.005–1.030)
pH: 5 (ref 5.0–8.0)

## 2020-01-17 LAB — CBG MONITORING, ED: Glucose-Capillary: 77 mg/dL (ref 70–99)

## 2020-01-17 LAB — IRON AND TIBC
Iron: 48 ug/dL (ref 45–182)
Saturation Ratios: 26 % (ref 17.9–39.5)
TIBC: 187 ug/dL — ABNORMAL LOW (ref 250–450)
UIBC: 139 ug/dL

## 2020-01-17 LAB — RESP PANEL BY RT-PCR (FLU A&B, COVID) ARPGX2
Influenza A by PCR: NEGATIVE
Influenza B by PCR: NEGATIVE
SARS Coronavirus 2 by RT PCR: NEGATIVE

## 2020-01-17 MED ORDER — MAGNESIUM HYDROXIDE 400 MG/5ML PO SUSP: 30.0000 mL | Freq: Every day | ORAL | Status: DC | PRN

## 2020-01-17 MED ORDER — LEVOTHYROXINE SODIUM 88 MCG PO TABS
88.0000 ug | ORAL_TABLET | Freq: Every day | ORAL | Status: DC
  Administered 2020-01-18 – 2020-01-22 (×5): 88 ug via ORAL
  Filled 2020-01-17 (×5): qty 1

## 2020-01-17 MED ORDER — SODIUM CHLORIDE 0.9 % IV SOLN: INTRAVENOUS | Status: DC

## 2020-01-17 MED ORDER — DOCUSATE SODIUM 100 MG PO CAPS
100.0000 mg | ORAL_CAPSULE | Freq: Two times a day (BID) | ORAL | Status: DC
  Administered 2020-01-17 – 2020-01-22 (×10): 100 mg via ORAL
  Filled 2020-01-17 (×10): qty 1

## 2020-01-17 MED ORDER — LEVOTHYROXINE SODIUM 88 MCG PO TABS: 88.0000 ug | ORAL_TABLET | Freq: Every day | ORAL | Status: DC

## 2020-01-17 MED ORDER — ESCITALOPRAM OXALATE 10 MG PO TABS
10.0000 mg | ORAL_TABLET | Freq: Every day | ORAL | Status: DC
  Administered 2020-01-17 – 2020-01-21 (×5): 10 mg via ORAL
  Filled 2020-01-17 (×5): qty 1

## 2020-01-17 MED ORDER — PREDNISONE 5 MG PO TABS
5.0000 mg | ORAL_TABLET | Freq: Every day | ORAL | Status: DC
Start: 2020-01-18 — End: 2020-01-22
  Administered 2020-01-18 – 2020-01-22 (×5): 5 mg via ORAL
  Filled 2020-01-17 (×5): qty 1

## 2020-01-17 MED ORDER — TAMSULOSIN HCL 0.4 MG PO CAPS
0.4000 mg | ORAL_CAPSULE | Freq: Every day | ORAL | Status: DC
  Administered 2020-01-17 – 2020-01-21 (×5): 0.4 mg via ORAL
  Filled 2020-01-17 (×5): qty 1

## 2020-01-17 MED ORDER — CLONAZEPAM 1 MG PO TABS
1.0000 mg | ORAL_TABLET | Freq: Every morning | ORAL | Status: DC
  Administered 2020-01-18 – 2020-01-22 (×5): 1 mg via ORAL
  Filled 2020-01-17 (×5): qty 1

## 2020-01-17 MED ORDER — ENOXAPARIN SODIUM 40 MG/0.4ML ~~LOC~~ SOLN
40.0000 mg | SUBCUTANEOUS | Status: DC
  Administered 2020-01-17 – 2020-01-21 (×5): 40 mg via SUBCUTANEOUS
  Filled 2020-01-17 (×5): qty 0.4

## 2020-01-17 MED ORDER — MORPHINE SULFATE (PF) 4 MG/ML IV SOLN
6.0000 mg | Freq: Once | INTRAVENOUS | Status: AC
  Administered 2020-01-17: 6 mg via INTRAVENOUS
  Filled 2020-01-17: qty 2

## 2020-01-17 MED ORDER — HYDROMORPHONE HCL 1 MG/ML IJ SOLN
0.5000 mg | INTRAMUSCULAR | Status: DC | PRN
  Administered 2020-01-17 – 2020-01-21 (×15): 0.5 mg via INTRAVENOUS
  Filled 2020-01-17: qty 0.5
  Filled 2020-01-17: qty 1
  Filled 2020-01-17 (×13): qty 0.5

## 2020-01-17 MED ORDER — HYDROCODONE-ACETAMINOPHEN 5-325 MG PO TABS
1.0000 | ORAL_TABLET | Freq: Four times a day (QID) | ORAL | Status: DC | PRN
  Administered 2020-01-17: 1 via ORAL
  Administered 2020-01-17 – 2020-01-18 (×2): 2 via ORAL
  Administered 2020-01-18: 1 via ORAL
  Administered 2020-01-18 – 2020-01-20 (×8): 2 via ORAL
  Filled 2020-01-17 (×12): qty 2
  Filled 2020-01-17: qty 1
  Filled 2020-01-17 (×3): qty 2

## 2020-01-17 NOTE — ED Provider Notes (Addendum)
[place] DEPT Provider Note   CSN: [id number] Arrival date & time: 01/17/20  7026     History Chief Complaint  Patient presents with  . Fall  . Hip Pain    Maxwell Fox is a 74 y.o. male.  74 year old male presents complaining of left hip pain after mechanical fall just prior to arrival.  Patient states that he slipped on water and fell onto his left hip.  Denies any loss of consciousness.  Denies any use of blood thinners.  Complains of severe sharp left hip pain and he has been unable to ambulate.  Denies any lower back discomfort.  Denies any chest or abdominal discomfort.  Called EMS and was transported here        Past Medical History:  Diagnosis Date  . B12 nutritional deficiency    malabsorbtion  . BPH (benign prostatic hypertrophy)   . Diverticulosis   . Hypertension   . Iron deficiency anemia    malabsorption related (s/p gastric bypass)  . Melanoma ([place])   . Obesity    s/p gastric bypass 2000, start 355#  . Pancreatic cyst   . Pulmonary nodules     Patient Active Problem List   Diagnosis Date Noted  . Anxiety and depression 01/10/2018  . History of antineoplastic chemotherapy 07/19/2017  . Chronic midline low back pain without sciatica 04/19/2017  . Metastatic melanoma ([place]) 01/07/2017  . Adrenal insufficiency due to cancer therapy ([place]) 07/27/2016  . Secondary hypothyroidism 07/27/2016  . H/O gastric bypass 01/06/2016  . Pancreatic cyst 01/06/2016  . Metastatic melanoma of brain 12/31/2015  . Iron deficiency anemia   . B12 nutritional deficiency   . Hypertension   . Benign prostatic hyperplasia     Past Surgical History:  Procedure Laterality Date  . CRANIOTOMY FOR TUMOR  12/11/2015   metastatic melanoma  . MOHS SURGERY  2012   melanoma, mid back, [place]  . ROUX-EN-Y PROCEDURE  2000  . TOTAL SHOULDER REPLACEMENT Right 2011   [place]       Family History  Problem Relation Age of Onset  .  Hypertension Father   . Macular degeneration Mother   . Melanoma Mother   . Melanoma Sister   . Breast cancer Sister   . Uterine cancer Sister   . Stomach cancer Sister   . Melanoma Brother     Social History   Tobacco Use  . Smoking status: Former Smoker    Quit date: 02/03/1963    Years since quitting: 56.9  . Smokeless tobacco: Never Used    Home Medications Prior to Admission medications   Medication Sig Start Date End Date Taking? Authorizing Provider  calcium carbonate (OS-CAL - DOSED IN MG OF ELEMENTAL CALCIUM) 1250 (500 Ca) MG tablet Take by mouth.    [provider]  calcium gluconate 500 MG tablet Take 1 tablet (500 mg total) by mouth 2 (two) times daily. 10/26/13   [name], MD  Cholecalciferol (VITAMIN D) 2000 UNITS tablet Take 1 tablet (2,000 Units total) by mouth daily. 10/26/13   [name], MD  clonazePAM (KLONOPIN) 1 MG tablet TAKE 1 TABLET(1 MG) BY MOUTH TWICE DAILY AS NEEDED FOR ANXIETY.  Due for follow up for further refills 01/03/20   [name], MD  escitalopram (LEXAPRO) 10 MG tablet TAKE 1 TABLET(10 MG) BY MOUTH DAILY 11/23/19   [name], MD  levothyroxine (SYNTHROID) 88 MCG tablet Take 1 tablet (88 mcg total) by mouth daily  before breakfast. 06/26/19   [name], MD  Multiple Vitamins-Minerals (MULTIVITAMIN) tablet Take 1 tablet by mouth daily. 10/26/13   [name], MD  predniSONE (DELTASONE) 5 MG tablet Take 1 tablet (5 mg total) by mouth daily with breakfast. 06/26/19   [name], MD  tamsulosin (FLOMAX) 0.4 MG CAPS capsule TAKE 1 CAPSULE(0.4 MG) BY MOUTH DAILY AFTER SUPPER 11/30/19   [name], MD    Allergies    Sertraline  Review of Systems   Review of Systems  All other systems reviewed and are negative.   Physical Exam Updated Vital Signs BP (!) 150/76 (BP Location: Right Arm)   Pulse 64   Temp 98.2 F (36.8 C) (Oral)   Resp 16   Ht 1.778 m (5\' 10" )   Wt 88 kg   SpO2 99%    BMI 27.84 kg/m   Physical Exam Vitals and nursing note reviewed.  Constitutional:      General: He is not in acute distress.    Appearance: Normal appearance. He is well-developed and well-nourished. He is not toxic-appearing.  HENT:     Head: Normocephalic and atraumatic.  Eyes:     General: Lids are normal.     Extraocular Movements: EOM normal.     Conjunctiva/sclera: Conjunctivae normal.     Pupils: Pupils are equal, round, and reactive to light.  Neck:     Thyroid: No thyroid mass.     Trachea: No tracheal deviation.  Cardiovascular:     Rate and Rhythm: Normal rate and regular rhythm.     Heart sounds: Normal heart sounds. No murmur heard. No gallop.   Pulmonary:     Effort: Pulmonary effort is normal. No respiratory distress.     Breath sounds: Normal breath sounds. No stridor. No decreased breath sounds, wheezing, rhonchi or rales.  Abdominal:     General: Bowel sounds are normal. There is no distension.     Palpations: Abdomen is soft.     Tenderness: There is no abdominal tenderness. There is no CVA tenderness or rebound.  Musculoskeletal:        General: No tenderness or edema. Normal range of motion.     Cervical back: Normal range of motion and neck supple.       Legs:     Comments: Some shortening of the left lower extremity.  Neurovascular intact at left foot.  No pain at the knee or ankle or foot.  Skin:    General: Skin is warm and dry.     Findings: No abrasion or rash.  Neurological:     Mental Status: He is alert and oriented to person, place, and time.     GCS: GCS eye subscore is 4. GCS verbal subscore is 5. GCS motor subscore is 6.     Cranial Nerves: No cranial nerve deficit.     Sensory: No sensory deficit.     Deep Tendon Reflexes: Strength normal.  Psychiatric:        Mood and Affect: Mood and affect normal.        Speech: Speech normal.        Behavior: Behavior normal.     ED Results / Procedures / Treatments   Labs (all labs ordered  are listed, but only abnormal results are displayed) Labs Reviewed  RESP PANEL BY RT-PCR (FLU A&B, COVID) ARPGX2  CBC WITH DIFFERENTIAL/PLATELET  COMPREHENSIVE METABOLIC PANEL  URINALYSIS, ROUTINE W REFLEX MICROSCOPIC    EKG EKG Interpretation  Date/Time:  Wednesday January 17 2020 09:09:08 EST Ventricular Rate:  66 PR Interval:    QRS Duration: 106 QT Interval:  404 QTC Calculation: 424 R Axis:   -29 Text Interpretation: Sinus rhythm Borderline prolonged PR interval Borderline left axis deviation Abnormal R-wave progression, early transition Confirmed by Lacretia Leigh ([id number]) on 01/17/2020 9:41:33 AM   Radiology No results found.  Procedures Procedures (including critical care time)  Medications Ordered in ED Medications  0.9 %  sodium chloride infusion (has no administration in time range)    ED Course  I have reviewed the triage vital signs and the nursing notes.  Pertinent labs & imaging results that were available during my care of the patient were reviewed by me and considered in my medical decision making (see chart for details).    MDM Rules/Calculators/A&P                          Patient medicated for pain here.  X-ray of patient's pelvis is positive for family to place left superior ramus fracture.  Will admit to the hospital service.  Will consult orthopedics Final Clinical Impression(s) / ED Diagnoses Final diagnoses:  SOB (shortness of breath)    Rx / DC Orders ED Discharge Orders    None       Lacretia Leigh, MD 01/17/20 1027    Lacretia Leigh, MD 01/17/20 1046

## 2020-01-17 NOTE — ED Notes (Signed)
Called transport 

## 2020-01-17 NOTE — ED Triage Notes (Signed)
S/p fall in bathroom after slipping in water. Patient was able to crawl to bed and get up into bed. L hip pain. -shortening/rotation. +pedal pulse. Skin tear to left elbow. -loc, -hitting head, -Blood thinners.

## 2020-01-17 NOTE — H&P (Signed)
History and Physical    Maxwell Fox WIO:[id number] DOB: Oct 11, 1945 DOA: 01/17/2020  PCP: [name], MD  Patient coming from: Home  Chief Complaint: hip pain  HPI: Maxwell Fox is a 74 y.o. male with medical history significant of chronic adrenal insufficiency, hypothyroidism, anxiety. Presenting with hip pain after fall. Reports that he went to the bathroom this morning. As he was leaving, he slipped on some water. He fell to his stomach. There was no head injury or LOC. He remembers the entire fall. There were no heralding symptoms. He was able to crawl back to bed. He then called for EMS.   This is his first fall. He reports being general healthy and a person that walks 4 miles per day w/o difficulty.   ED Course: XR showed minimally displaced left superior ramus fracture. TRH was called for admission.   Review of Systems:  Denies CP, dyspnea, lightheadedness, syncopal episodes, seizure-like episodes. Review of systems is otherwise negative for all not mentioned in HPI.   PMHx Past Medical History:  Diagnosis Date  . B12 nutritional deficiency    malabsorbtion  . BPH (benign prostatic hypertrophy)   . Diverticulosis   . Hypertension   . Iron deficiency anemia    malabsorption related (s/p gastric bypass)  . Melanoma ([place])   . Obesity    s/p gastric bypass 2000, start 355#  . Pancreatic cyst   . Pulmonary nodules     PSHx Past Surgical History:  Procedure Laterality Date  . CRANIOTOMY FOR TUMOR  12/11/2015   metastatic melanoma  . MOHS SURGERY  2012   melanoma, mid back, [place]  . ROUX-EN-Y PROCEDURE  2000  . TOTAL SHOULDER REPLACEMENT Right 2011   [place]    Soc[facility]x  reports that he quit smoking about 56 years ago. He has never used smokeless tobacco. No history on file for alcohol use and drug use.  Allergies  Allergen Reactions  . Sertraline     Nausea, fatigue, diarrhea, anorexia, insomnia    FamHx Family History  Problem Relation Age of  Onset  . Hypertension Father   . Macular degeneration Mother   . Melanoma Mother   . Melanoma Sister   . Breast cancer Sister   . Uterine cancer Sister   . Stomach cancer Sister   . Melanoma Brother     Prior to Admission medications   Medication Sig Start Date End Date Taking? Authorizing Provider  calcium carbonate (OS-CAL - DOSED IN MG OF ELEMENTAL CALCIUM) 1250 (500 Ca) MG tablet Take by mouth.    [provider]  calcium gluconate 500 MG tablet Take 1 tablet (500 mg total) by mouth 2 (two) times daily. 10/26/13   [name], MD  Cholecalciferol (VITAMIN D) 2000 UNITS tablet Take 1 tablet (2,000 Units total) by mouth daily. 10/26/13   [name], MD  clonazePAM (KLONOPIN) 1 MG tablet TAKE 1 TABLET(1 MG) BY MOUTH TWICE DAILY AS NEEDED FOR ANXIETY.  Due for follow up for further refills 01/03/20   [name], MD  escitalopram (LEXAPRO) 10 MG tablet TAKE 1 TABLET(10 MG) BY MOUTH DAILY 11/23/19   [name], MD  levothyroxine (SYNTHROID) 88 MCG tablet Take 1 tablet (88 mcg total) by mouth daily before breakfast. 06/26/19   [name], MD  Multiple Vitamins-Minerals (MULTIVITAMIN) tablet Take 1 tablet by mouth daily. 10/26/13   [name], MD  predniSONE (DELTASONE) 5 MG tablet Take 1 tablet (5 mg total) by mouth daily  with breakfast. 06/26/19   [name], MD  tamsulosin (FLOMAX) 0.4 MG CAPS capsule TAKE 1 CAPSULE(0.4 MG) BY MOUTH DAILY AFTER SUPPER 11/30/19   [name], MD    Physical Exam: Vitals:   01/17/20 0855 01/17/20 1008  BP: (!) 150/76 (!) 159/79  Pulse: 64 64  Resp: 16 15  Temp: 98.2 F (36.8 C)   TempSrc: Oral   SpO2: 99% 100%  Weight: 88 kg   Height: 5\' 10"  (1.778 m)     General: 74 y.o. male resting in bed in NAD Eyes: PERRL, normal sclera ENMT: Nares patent w/o discharge, orophaynx clear, dentition normal, ears w/o discharge/lesions/ulcers Neck: Supple, trachea midline Cardiovascular: RRR, +S1, S2, no  m/g/r, equal pulses throughout Respiratory: CTABL, no w/r/r, normal WOB GI: BS+, NDNT, no masses noted, no organomegaly noted MSK: No e/c/c; limited ROM of left him Skin: No rashes, bruises, ulcerations noted Neuro: A&O x 3, no focal deficits Psyc: Appropriate interaction and affect, calm/cooperative  Labs on Admission: I have personally reviewed following labs and imaging studies  CBC: Recent Labs  Lab 01/17/20 0902  WBC 8.4  NEUTROABS 6.2  HGB 12.3*  HCT 38.6*  MCV 91.9  PLT 458*   Basic Metabolic Panel: Recent Labs  Lab 01/17/20 0902  NA 143  K 4.1  CL 107  CO2 28  GLUCOSE 86  BUN 18  CREATININE 0.76  CALCIUM 9.3   GFR: Estimated Creatinine Clearance: 90.5 mL/min (by C-G formula based on SCr of 0.76 mg/dL). Liver Function Tests: Recent Labs  Lab 01/17/20 0902  AST 28  ALT 20  ALKPHOS 45  BILITOT 0.7  PROT 5.5*  ALBUMIN 3.2*   No results for input(s): LIPASE, AMYLASE in the last 168 hours. No results for input(s): AMMONIA in the last 168 hours. Coagulation Profile: No results for input(s): INR, PROTIME in the last 168 hours. Cardiac Enzymes: No results for input(s): CKTOTAL, CKMB, CKMBINDEX, TROPONINI in the last 168 hours. BNP (last 3 results) No results for input(s): PROBNP in the last 8760 hours. HbA1C: No results for input(s): HGBA1C in the last 72 hours. CBG: Recent Labs  Lab 01/17/20 0902  GLUCAP 77   Lipid Profile: No results for input(s): CHOL, HDL, LDLCALC, TRIG, CHOLHDL, LDLDIRECT in the last 72 hours. Thyroid Function Tests: No results for input(s): TSH, T4TOTAL, FREET4, T3FREE, THYROIDAB in the last 72 hours. Anemia Panel: No results for input(s): VITAMINB12, FOLATE, FERRITIN, TIBC, IRON, RETICCTPCT in the last 72 hours. Urine analysis:    Component Value Date/Time   COLORURINE YELLOW 01/17/2020 1005   APPEARANCEUR CLEAR 01/17/2020 1005   LABSPEC 1.012 01/17/2020 1005   PHURINE 5.0 01/17/2020 1005   GLUCOSEU NEGATIVE 01/17/2020  1005   HGBUR MODERATE (A) 01/17/2020 1005   BILIRUBINUR NEGATIVE 01/17/2020 1005   KETONESUR NEGATIVE 01/17/2020 1005   PROTEINUR NEGATIVE 01/17/2020 1005   NITRITE NEGATIVE 01/17/2020 1005   LEUKOCYTESUR NEGATIVE 01/17/2020 1005    Radiological Exams on Admission: DG Chest 2 View  Result Date: 01/17/2020 CLINICAL DATA:  S/p fall in bathroom after slipping in water this am. Patient was able to crawl to bed and get up into bed. L hip pain. -shortening/rotation EXAM: CHEST - 2 VIEW COMPARISON:  08/16/2018 FINDINGS: Lungs are clear. Heart size and mediastinal contours are within normal limits. Aortic Atherosclerosis (ICD10-170.0). No effusion. Right shoulder arthroplasty. IMPRESSION: No acute cardiopulmonary disease. Electronically Signed   By: Lucrezia Europe M.D.   On: 01/17/2020 09:41   DG Hip Unilat W or  Wo Pelvis 2-3 Views Left  Result Date: 01/17/2020 CLINICAL DATA:  Pain post fall today EXAM: DG HIP (WITH OR WITHOUT PELVIS) 2-3V LEFT COMPARISON:  None. FINDINGS: Left pubic superior ramus fracture, minimally displaced. Femoral neck intact. No dislocation. Spondylitic changes in the lower lumbar spine. IMPRESSION: Minimally displaced left superior ramus fracture. Electronically Signed   By: Lucrezia Europe M.D.   On: 01/17/2020 09:40    EKG: Independently reviewed. Sinus, no st changes  Assessment/Plan Left superior ramus hip fracture     - place in obs, med-surg     - XR as above     - awaiting ortho recs     - PT/OT/TOC consults     - pain control; given morphine in ED and still in fair amount of pain     - lovenox for PPx  Chronic adrenal insuffiencey     - continue home steroids  Hypothyroidism     - continue home synthroid  Normocytic anemia     - no evidence of bleed     - check iron studies  BPH     - continue home flomax  Anxiety     - continue home meds when med rec complete  DVT prophylaxis: lovenox  Code Status: FULL  Family Communication: with dtr at bedside   Consults called: EDP has called ortho.   Status is: Observation  The patient remains OBS appropriate and will d/c before 2 midnights.  Dispo: The patient is from: Home              Anticipated d/c is to: Home              Anticipated d/c date is: 1 day              Patient currently is not medically stable to d/c.  Jonnie Finner DO [facility]  If 7PM-7AM, please contact night-coverage [email or web address]  01/17/2020, 11:04 AM

## 2020-01-18 DIAGNOSIS — S72002A Fracture of unspecified part of neck of left femur, initial encounter for closed fracture: Secondary | ICD-10-CM | POA: Diagnosis not present

## 2020-01-18 DIAGNOSIS — S32436A Nondisplaced fracture of anterior column [iliopubic] of unspecified acetabulum, initial encounter for closed fracture: Secondary | ICD-10-CM | POA: Diagnosis not present

## 2020-01-18 NOTE — Progress Notes (Signed)
PT Cancellation Note  Patient Details Name: Maxwell Fox MRN: [id number] DOB: Mar 07, 1945   Cancelled Treatment:    Reason Eval/Treat Not Completed: Other (comment) (awaiting ortho consult. PT/OT will start once we have a weight bearing status and activity orders. Will follow.)  Philomena Doheny PT 01/18/2020  Acute Rehabilitation Services Pager [phone] Office [phone]

## 2020-01-18 NOTE — Progress Notes (Signed)
PROGRESS NOTE    Maxwell Fox  KGM:[id number] DOB: Dec 10, 1945 DOA: 01/17/2020 PCP: [name], MD    Brief Narrative:  This 74 years old male with medical history significant for chronic adrenal insufficiency, hypothyroidism, anxiety presents in the emergency department with complaints of left hip pain status post fall.  Patient reports he slipped on the water inside the bathroom.  He denies any head injury or loss of consciousness,  He remembers the entire fall.  In the ER x-ray shows minimally displaced left superior ramus fracture.  Orthopedic consulted recommended conservative management,  PT and OT evaluation.  Assessment & Plan:   Active Problems:   Hip fracture (HCC)  Left superior ramus hip fracture: Patient presents in the ER s/p fall. X-ray shows minimally displaced left superior ramus fracture. Orthopedic consulted,  recommended conservative management. Adequate pain control, PT and OT evaluation.    Chronic adrenal insuffiencey continue home steroids  Hypothyroidism continue home synthroid  Normocytic anemia no evidence of bleed check iron studies  BPH  continue home flomax  Anxiety Resume home medications.   DVT prophylaxis: Lovenox Code Status: Full code Family Communication : Daughter at bedside Disposition Plan:   Status is: Observation  The patient remains OBS appropriate and will d/c before 2 midnights.  Dispo: The patient is from: Home              Anticipated d/c is to: SNF              Anticipated d/c date is: 1 day              Patient currently is not medically stable to d/c.  Consultants:   Orthopedics  Procedures:  None. Antimicrobials:   Anti-infectives (From admission, onward)   None      Subjective: Patient was seen and examined at bedside.  Overnight events noted.  Patient reports having tenderness on movement.  Objective: Vitals:   01/17/20 2204 01/18/20 0054 01/18/20 0502 01/18/20 1038  BP: 105/60 118/67  134/70 (!) 100/52  Pulse: 63 64 67 63  Resp: 16 16 16 18   Temp: 98.3 F (36.8 C) 98.1 F (36.7 C) (!) 97.5 F (36.4 C) 97.7 F (36.5 C)  TempSrc: Oral Oral Oral Oral  SpO2: 98% 97% 95% 94%  Weight:      Height:        Intake/Output Summary (Last 24 hours) at 01/18/2020 1420 Last data filed at 01/18/2020 1000 Gross per 24 hour  Intake 239 ml  Output 425 ml  Net -186 ml   Filed Weights   01/17/20 0855  Weight: 88 kg    Examination:  General exam: Appears calm and comfortable , not in any distress. Respiratory system: Clear to auscultation. Respiratory effort normal. Cardiovascular system: S1 & S2 heard, RRR. No JVD, murmurs, rubs, gallops or clicks. No pedal edema. Gastrointestinal system: Abdomen is nondistended, soft and nontender. No organomegaly or masses felt. Normal bowel sounds heard. Central nervous system: Alert and oriented. No focal neurological deficits. Extremities: No edema, no cyanosis, no clubbing. Tenderness noted in the pubic region. Skin: No rashes, lesions or ulcers Psychiatry: Judgement and insight appear normal. Mood & affect appropriate.     Data Reviewed: I have personally reviewed following labs and imaging studies  CBC: Recent Labs  Lab 01/17/20 0902  WBC 8.4  NEUTROABS 6.2  HGB 12.3*  HCT 38.6*  MCV 91.9  PLT 644*   Basic Metabolic Panel: Recent Labs  Lab 01/17/20 0902  NA 143  K 4.1  CL 107  CO2 28  GLUCOSE 86  BUN 18  CREATININE 0.76  CALCIUM 9.3   GFR: Estimated Creatinine Clearance: 90.5 mL/min (by C-G formula based on SCr of 0.76 mg/dL). Liver Function Tests: Recent Labs  Lab 01/17/20 0902  AST 28  ALT 20  ALKPHOS 45  BILITOT 0.7  PROT 5.5*  ALBUMIN 3.2*   No results for input(s): LIPASE, AMYLASE in the last 168 hours. No results for input(s): AMMONIA in the last 168 hours. Coagulation Profile: No results for input(s): INR, PROTIME in the last 168 hours. Cardiac Enzymes: No results for input(s): CKTOTAL,  CKMB, CKMBINDEX, TROPONINI in the last 168 hours. BNP (last 3 results) No results for input(s): PROBNP in the last 8760 hours. HbA1C: No results for input(s): HGBA1C in the last 72 hours. CBG: Recent Labs  Lab 01/17/20 0902  GLUCAP 77   Lipid Profile: No results for input(s): CHOL, HDL, LDLCALC, TRIG, CHOLHDL, LDLDIRECT in the last 72 hours. Thyroid Function Tests: No results for input(s): TSH, T4TOTAL, FREET4, T3FREE, THYROIDAB in the last 72 hours. Anemia Panel: Recent Labs    01/17/20 1139  TIBC 187*  IRON 48   Sepsis Labs: No results for input(s): PROCALCITON, LATICACIDVEN in the last 168 hours.  Recent Results (from the past 240 hour(s))  Resp Panel by RT-PCR (Flu A&B, Covid) Nasopharyngeal Swab     Status: None   Collection Time: 01/17/20  9:08 AM   Specimen: Nasopharyngeal Swab; Nasopharyngeal(NP) swabs in vial transport medium  Result Value Ref Range Status   SARS Coronavirus 2 by RT PCR NEGATIVE NEGATIVE Final    Comment: (NOTE) SARS-CoV-2 target nucleic acids are NOT DETECTED.  The SARS-CoV-2 RNA is generally detectable in upper respiratory specimens during the acute phase of infection. The lowest concentration of SARS-CoV-2 viral copies this assay can detect is 138 copies/mL. A negative result does not preclude SARS-Cov-2 infection and should not be used as the sole basis for treatment or other patient management decisions. A negative result may occur with  improper specimen collection/handling, submission of specimen other than nasopharyngeal swab, presence of viral mutation(s) within the areas targeted by this assay, and inadequate number of viral copies(<138 copies/mL). A negative result must be combined with clinical observations, patient history, and epidemiological information. The expected result is Negative.  Fact Sheet for Patients:  EntrepreneurPulse.com.au  Fact Sheet for Healthcare Providers:   IncredibleEmployment.be  This test is no t yet approved or cleared by the [place] FDA and  has been authorized for detection and/or diagnosis of SARS-CoV-2 by FDA under an Emergency Use Authorization (EUA). This EUA will remain  in effect (meaning this test can be used) for the duration of the COVID-19 declaration under Section 564(b)(1) of the Act, 21 U.S.C.section 360bbb-3(b)(1), unless the authorization is terminated  or revoked sooner.       Influenza A by PCR NEGATIVE NEGATIVE Final   Influenza B by PCR NEGATIVE NEGATIVE Final    Comment: (NOTE) The Xpert Xpress SARS-CoV-2/FLU/RSV plus assay is intended as an aid in the diagnosis of influenza from Nasopharyngeal swab specimens and should not be used as a sole basis for treatment. Nasal washings and aspirates are unacceptable for Xpert Xpress SARS-CoV-2/FLU/RSV testing.  Fact Sheet for Patients: EntrepreneurPulse.com.au  Fact Sheet for Healthcare Providers: IncredibleEmployment.be  This test is not yet approved or cleared by the [place] FDA and has been authorized for detection and/or diagnosis of SARS-CoV-2 by FDA under an Emergency Use Authorization (EUA).  This EUA will remain in effect (meaning this test can be used) for the duration of the COVID-19 declaration under Section 564(b)(1) of the Act, 21 U.S.C. section 360bbb-3(b)(1), unless the authorization is terminated or revoked.  Performed at [facility], [place] [address], [place], [place] [address]      Radiology Studies: DG Chest 2 View  Result Date: 01/17/2020 CLINICAL DATA:  S/p fall in bathroom after slipping in water this am. Patient was able to crawl to bed and get up into bed. L hip pain. -shortening/rotation EXAM: CHEST - 2 VIEW COMPARISON:  08/16/2018 FINDINGS: Lungs are clear. Heart size and mediastinal contours are within normal limits. Aortic Atherosclerosis  (ICD10-170.0). No effusion. Right shoulder arthroplasty. IMPRESSION: No acute cardiopulmonary disease. Electronically Signed   By: Lucrezia Europe M.D.   On: 01/17/2020 09:41   DG Hip Unilat W or Wo Pelvis 2-3 Views Left  Result Date: 01/17/2020 CLINICAL DATA:  Pain post fall today EXAM: DG HIP (WITH OR WITHOUT PELVIS) 2-3V LEFT COMPARISON:  None. FINDINGS: Left pubic superior ramus fracture, minimally displaced. Femoral neck intact. No dislocation. Spondylitic changes in the lower lumbar spine. IMPRESSION: Minimally displaced left superior ramus fracture. Electronically Signed   By: Lucrezia Europe M.D.   On: 01/17/2020 09:40    Scheduled Meds: . clonazePAM  1 mg Oral q AM  . docusate sodium  100 mg Oral BID  . enoxaparin (LOVENOX) injection  40 mg Subcutaneous Q24H  . escitalopram  10 mg Oral QHS  . levothyroxine  88 mcg Oral Q0600  . predniSONE  5 mg Oral Q breakfast  . tamsulosin  0.4 mg Oral QPC supper   Continuous Infusions: . sodium chloride 10 mL/hr at 01/17/20 1006     LOS: 0 days    Time spent: 25 mins    Avyan Livesay, MD [facility]   If 7PM-7AM, please contact night-coverage

## 2020-01-18 NOTE — Evaluation (Signed)
Occupational Therapy Evaluation Patient Details Name: Maxwell Fox MRN: [id number] DOB: 14-Dec-1945 Today's Date: 01/18/2020    History of Present Illness 74 y.o. male with medical history significant of chronic adrenal insufficiency, hypothyroidism, anxiety. Presenting with hip pain after fall. Imaging showed L superior ramus fracture.   Clinical Impression   [name] is a 74 year old man s/p fall at home in shower with a left superior ramus fracture and complaints of pain. On evaluation patient demonstrated functional upper body strength but overall decreased endurance for walker use, decreased activity tolerance, decreased strength and ROM of LLE, impaired balance and complaints of pain resulting in a significant decline in functional abilities. Patient max x 2 to transfer to side of bed, mod x 2 to stand from bed height and +2 for safety to ambulate short distance forward with RW. Patient set up for UB ADLs, and max -total assist for LB ADLs and toileting. Patient will benefit from skilled OT services while in hospital to improve deficits and learn compensatory strategies as needed in order to improve functional abilities. At this time patient may benefit from short term rehab. IF patient progresses and is able to ambulate enough for return home will update POC.     Follow Up Recommendations  Home health OT;SNF    Equipment Recommendations  3 in 1 bedside commode    Recommendations for Other Services       Precautions / Restrictions Precautions Precautions: Fall Precaution Comments: pt denies other falls in past 1 year, slipped in bathroom Restrictions Weight Bearing Restrictions: No      Mobility Bed Mobility Overal bed mobility: Needs Assistance Bed Mobility: Supine to Sit     Supine to sit: Max assist;+2 for physical assistance     General bed mobility comments: +2 max assist to pivot hips with pad, support LEs and raise trunk    Transfers Overall  transfer level: Needs assistance Equipment used: Rolling walker (2 wheeled) Transfers: Sit to/from Stand Sit to Stand: From elevated surface;+2 physical assistance;Mod assist         General transfer comment: VCs hand placement    Balance Overall balance assessment: Needs assistance Sitting-balance support: No upper extremity supported;Feet supported Sitting balance-Leahy Scale: Good     Standing balance support: Bilateral upper extremity supported Standing balance-Leahy Scale: Poor Standing balance comment: relies on BUE support                           ADL either performed or assessed with clinical judgement   ADL Overall ADL's : Needs assistance/impaired Eating/Feeding: Independent   Grooming: Set up   Upper Body Bathing: Set up   Lower Body Bathing: Maximal assistance;Sit to/from stand   Upper Body Dressing : Set up;Sitting   Lower Body Dressing: Total assistance;Sit to/from stand   Toilet Transfer: Minimal assistance;Stand-pivot;BSC;RW   Toileting- Clothing Manipulation and Hygiene: Maximal assistance;Sit to/from stand               Vision Patient Visual Report: No change from baseline       Perception     Praxis      Pertinent Vitals/Pain Pain Assessment: 0-10 Pain Score: 7  Pain Location: L pelvis Pain Descriptors / Indicators: Sore;Guarding Pain Intervention(s): Limited activity within patient's tolerance;Premedicated before session;Monitored during session;Patient requesting pain meds-RN notified     Hand Dominance Right   Extremity/Trunk Assessment Upper Extremity Assessment Upper Extremity Assessment: RUE deficits/detail;LUE deficits/detail RUE Deficits / Details:  4/5 shoulder, bicep 4+/5, tricep 4+/5, wrist 5/5, grip 4/5 LUE Deficits / Details: 4/5 shoulder, bicep 4+/5, tricep 4+/5, wrist 5/5, grip 4/5   Lower Extremity Assessment Lower Extremity Assessment: Defer to PT evaluation LLE Deficits / Details: hip +2/5 limited  by pain, knee ext 3/5 LLE Sensation: WNL LLE Coordination: WNL   Cervical / Trunk Assessment Cervical / Trunk Assessment: Other exceptions (forward head)   Communication Communication Communication: No difficulties   Cognition Arousal/Alertness: Awake/alert Behavior During Therapy: WFL for tasks assessed/performed Overall Cognitive Status: Within Functional Limits for tasks assessed                                     General Comments       Exercises General Exercises - Lower Extremity Ankle Circles/Pumps: AROM;Both;10 reps;Supine Quad Sets: AROM;Both;5 reps;Supine Heel Slides: AAROM;Left;5 reps;Supine Hip ABduction/ADduction: AAROM;Left;5 reps;Supine   Shoulder Instructions      Home Living Family/patient expects to be discharged to:: Private residence Living Arrangements: Spouse/significant other Available Help at Discharge: Family;Available 24 hours/day Type of Home: House Home Access: Stairs to enter [employer] of Steps: 2 Entrance Stairs-Rails: Left Home Layout: One level;Able to live on main level with bedroom/bathroom     Bathroom Shower/Tub: Walk-in shower;Door   Bathroom Toilet: Handicapped height Bathroom Accessibility: Yes   Home Equipment: Grab bars - toilet;Shower seat - built in          Prior Functioning/Environment Level of Independence: Independent                 OT Problem List: Decreased activity tolerance;Decreased safety awareness;Decreased strength;Decreased range of motion;Decreased knowledge of use of DME or AE;Pain      OT Treatment/Interventions: Self-care/ADL training;Therapeutic exercise;DME and/or AE instruction;Balance training;Patient/family education;Therapeutic activities    OT Goals(Current goals can be found in the care plan section) Acute Rehab OT Goals Patient Stated Goal: return to walking 4 miles per day OT Goal Formulation: With patient Time For Goal Achievement: 02/01/20 Potential  to Achieve Goals: Good  OT Frequency: Min 2X/week   Barriers to D/C:            Co-evaluation PT/OT/SLP Co-Evaluation/Treatment: Yes Reason for Co-Treatment: For patient/therapist safety;To address functional/ADL transfers PT goals addressed during session: Mobility/safety with mobility OT goals addressed during session: ADL's and self-care      AM-PAC OT "6 Clicks" Daily Activity     Outcome Measure Help from another person eating meals?: None Help from another person taking care of personal grooming?: A Little Help from another person toileting, which includes using toliet, bedpan, or urinal?: A Lot Help from another person bathing (including washing, rinsing, drying)?: A Lot Help from another person to put on and taking off regular upper body clothing?: A Little Help from another person to put on and taking off regular lower body clothing?: Total 6 Click Score: 15   End of Session Equipment Utilized During Treatment: Gait belt;Rolling walker Nurse Communication: Mobility status;Patient requests pain meds  Activity Tolerance: Patient limited by pain Patient left: in chair;with call bell/phone within reach;with chair alarm set;with family/visitor present  OT Visit Diagnosis: Unsteadiness on feet (R26.81);Other abnormalities of gait and mobility (R26.89);History of falling (Z91.81);Pain Pain - Right/Left: Left Pain - part of body: Hip                Time: 9150-5697 OT Time Calculation (min): 28 min Charges:  OT General Charges $OT  Visit: 1 Visit OT Evaluation $OT Eval Low Complexity: 1 Low  Edy Belt, OTR/L [place]  Office [phone] Pager: [place] 01/18/2020, 3:09 PM

## 2020-01-18 NOTE — Consult Note (Signed)
Reason for Consult:pelvic fracture  Referring Physician: [name] is an 74 y.o. male.  HPI: 74 year old active male who slipped on water in the bathroom landing directly on his left side.  Immediate pain and inability to weight-bear.  He was seen in the emergency department where x-rays revealed nondisplaced superior ramus fracture.  He notes anterior pelvic and groin pain and significant pain when trying to ambulate.  Denies other extremity injuries with the fall.  Past Medical History:  Diagnosis Date  . B12 nutritional deficiency    malabsorbtion  . BPH (benign prostatic hypertrophy)   . Diverticulosis   . Hypertension   . Iron deficiency anemia    malabsorption related (s/p gastric bypass)  . Melanoma ([place])   . Obesity    s/p gastric bypass 2000, start 355#  . Pancreatic cyst   . Pulmonary nodules     Past Surgical History:  Procedure Laterality Date  . CRANIOTOMY FOR TUMOR  12/11/2015   metastatic melanoma  . MOHS SURGERY  2012   melanoma, mid back, [place]  . ROUX-EN-Y PROCEDURE  2000  . TOTAL SHOULDER REPLACEMENT Right 2011   [place]    Family History  Problem Relation Age of Onset  . Hypertension Father   . Macular degeneration Mother   . Melanoma Mother   . Melanoma Sister   . Breast cancer Sister   . Uterine cancer Sister   . Stomach cancer Sister   . Melanoma Brother     Social History:  reports that he quit smoking about 56 years ago. He has never used smokeless tobacco. No history on file for alcohol use and drug use.  Allergies: No Known Allergies  Medications: I have reviewed the patient's current medications.  Results for orders placed or performed during the hospital encounter of 01/17/20 (from the past 48 hour(s))  CBC with Differential/Platelet     Status: Abnormal   Collection Time: 01/17/20  9:02 AM  Result Value Ref Range   WBC 8.4 4.0 - 10.5 K/uL   RBC 4.20 (L) 4.22 - 5.81 MIL/uL   Hemoglobin 12.3 (L) 13.0 - 17.0 g/dL    HCT 38.6 (L) 39.0 - 52.0 %   MCV 91.9 80.0 - 100.0 fL   MCH 29.3 26.0 - 34.0 pg   MCHC 31.9 30.0 - 36.0 g/dL   RDW 14.3 11.5 - 15.5 %   Platelets 118 (L) 150 - 400 K/uL    Comment: REPEATED TO VERIFY PLATELET COUNT CONFIRMED BY SMEAR SPECIMEN CHECKED FOR CLOTS Immature Platelet Fraction may be clinically indicated, consider ordering this additional test [id number]    nRBC 0.0 0.0 - 0.2 %   Neutrophils Relative % 73 %   Neutro Abs 6.2 1.7 - 7.7 K/uL   Lymphocytes Relative 17 %   Lymphs Abs 1.4 0.7 - 4.0 K/uL   Monocytes Relative 7 %   Monocytes Absolute 0.6 0.1 - 1.0 K/uL   Eosinophils Relative 1 %   Eosinophils Absolute 0.1 0.0 - 0.5 K/uL   Basophils Relative 1 %   Basophils Absolute 0.1 0.0 - 0.1 K/uL   Immature Granulocytes 1 %   Abs Immature Granulocytes 0.05 0.00 - 0.07 K/uL    Comment: Performed at [facility], [place] [address], [place], [place] [address]  Comprehensive metabolic panel     Status: Abnormal   Collection Time: 01/17/20  9:02 AM  Result Value Ref Range   Sodium 143 135 - 145 mmol/L   Potassium 4.1 3.5 -  5.1 mmol/L   Chloride 107 98 - 111 mmol/L   CO2 28 22 - 32 mmol/L   Glucose, Bld 86 70 - 99 mg/dL    Comment: Glucose reference range applies only to samples taken after fasting for at least 8 hours.   BUN 18 8 - 23 mg/dL   Creatinine, Ser 0.76 0.61 - 1.24 mg/dL   Calcium 9.3 8.9 - 10.3 mg/dL   Total Protein 5.5 (L) 6.5 - 8.1 g/dL   Albumin 3.2 (L) 3.5 - 5.0 g/dL   AST 28 15 - 41 U/L   ALT 20 0 - 44 U/L   Alkaline Phosphatase 45 38 - 126 U/L   Total Bilirubin 0.7 0.3 - 1.2 mg/dL   GFR, Estimated >60 >60 mL/min    Comment: (NOTE) Calculated using the CKD-EPI Creatinine Equation (2021)    Anion gap 8 5 - 15    Comment: Performed at [facility], [place] [address] [place], [place] [address]  CBG monitoring, ED     Status: None   Collection Time: 01/17/20  9:02 AM  Result Value Ref Range   Glucose-Capillary 77 70 -  99 mg/dL    Comment: Glucose reference range applies only to samples taken after fasting for at least 8 hours.  Resp Panel by RT-PCR (Flu A&B, Covid) Nasopharyngeal Swab     Status: None   Collection Time: 01/17/20  9:08 AM   Specimen: Nasopharyngeal Swab; Nasopharyngeal(NP) swabs in vial transport medium  Result Value Ref Range   SARS Coronavirus 2 by RT PCR NEGATIVE NEGATIVE    Comment: (NOTE) SARS-CoV-2 target nucleic acids are NOT DETECTED.  The SARS-CoV-2 RNA is generally detectable in upper respiratory specimens during the acute phase of infection. The lowest concentration of SARS-CoV-2 viral copies this assay can detect is 138 copies/mL. A negative result does not preclude SARS-Cov-2 infection and should not be used as the sole basis for treatment or other patient management decisions. A negative result may occur with  improper specimen collection/handling, submission of specimen other than nasopharyngeal swab, presence of viral mutation(s) within the areas targeted by this assay, and inadequate number of viral copies(<138 copies/mL). A negative result must be combined with clinical observations, patient history, and epidemiological information. The expected result is Negative.  Fact Sheet for Patients:  EntrepreneurPulse.com.au  Fact Sheet for Healthcare Providers:  IncredibleEmployment.be  This test is no t yet approved or cleared by the [place] FDA and  has been authorized for detection and/or diagnosis of SARS-CoV-2 by FDA under an Emergency Use Authorization (EUA). This EUA will remain  in effect (meaning this test can be used) for the duration of the COVID-19 declaration under Section 564(b)(1) of the Act, 21 U.S.C.section 360bbb-3(b)(1), unless the authorization is terminated  or revoked sooner.       Influenza A by PCR NEGATIVE NEGATIVE   Influenza B by PCR NEGATIVE NEGATIVE    Comment: (NOTE) The Xpert Xpress  SARS-CoV-2/FLU/RSV plus assay is intended as an aid in the diagnosis of influenza from Nasopharyngeal swab specimens and should not be used as a sole basis for treatment. Nasal washings and aspirates are unacceptable for Xpert Xpress SARS-CoV-2/FLU/RSV testing.  Fact Sheet for Patients: EntrepreneurPulse.com.au  Fact Sheet for Healthcare Providers: IncredibleEmployment.be  This test is not yet approved or cleared by the [place] FDA and has been authorized for detection and/or diagnosis of SARS-CoV-2 by FDA under an Emergency Use Authorization (EUA). This EUA will remain in effect (meaning this test  can be used) for the duration of the COVID-19 declaration under Section 564(b)(1) of the Act, 21 U.S.C. section 360bbb-3(b)(1), unless the authorization is terminated or revoked.  Performed at [facility], [place] [address], [place], [place] [address]   Urinalysis, Routine w reflex microscopic Urine, Clean Catch     Status: Abnormal   Collection Time: 01/17/20 10:05 AM  Result Value Ref Range   Color, Urine YELLOW YELLOW   APPearance CLEAR CLEAR   Specific Gravity, Urine 1.012 1.005 - 1.030   pH 5.0 5.0 - 8.0   Glucose, UA NEGATIVE NEGATIVE mg/dL   Hgb urine dipstick MODERATE (A) NEGATIVE   Bilirubin Urine NEGATIVE NEGATIVE   Ketones, ur NEGATIVE NEGATIVE mg/dL   Protein, ur NEGATIVE NEGATIVE mg/dL   Nitrite NEGATIVE NEGATIVE   Leukocytes,Ua NEGATIVE NEGATIVE   RBC / HPF 21-50 0 - 5 RBC/hpf   WBC, UA 0-5 0 - 5 WBC/hpf   Bacteria, UA NONE SEEN NONE SEEN    Comment: Performed at [facility], [place] [address], [place], [place] [address]  Iron and TIBC     Status: Abnormal   Collection Time: 01/17/20 11:39 AM  Result Value Ref Range   Iron 48 45 - 182 ug/dL   TIBC 187 (L) 250 - 450 ug/dL   Saturation Ratios 26 17.9 - 39.5 %   UIBC 139 ug/dL    Comment: Performed at [facility], [place]  [address], [place], [place] [address]    DG Chest 2 View  Result Date: 01/17/2020 CLINICAL DATA:  S/p fall in bathroom after slipping in water this am. Patient was able to crawl to bed and get up into bed. L hip pain. -shortening/rotation EXAM: CHEST - 2 VIEW COMPARISON:  08/16/2018 FINDINGS: Lungs are clear. Heart size and mediastinal contours are within normal limits. Aortic Atherosclerosis (ICD10-170.0). No effusion. Right shoulder arthroplasty. IMPRESSION: No acute cardiopulmonary disease. Electronically Signed   By: Lucrezia Europe M.D.   On: 01/17/2020 09:41   DG Hip Unilat W or Wo Pelvis 2-3 Views Left  Result Date: 01/17/2020 CLINICAL DATA:  Pain post fall today EXAM: DG HIP (WITH OR WITHOUT PELVIS) 2-3V LEFT COMPARISON:  None. FINDINGS: Left pubic superior ramus fracture, minimally displaced. Femoral neck intact. No dislocation. Spondylitic changes in the lower lumbar spine. IMPRESSION: Minimally displaced left superior ramus fracture. Electronically Signed   By: Lucrezia Europe M.D.   On: 01/17/2020 09:40    Review of Systems  All other systems reviewed and are negative.  Blood pressure (!) 100/52, pulse 63, temperature 97.7 F (36.5 C), temperature source Oral, resp. rate 18, height 5\' 10"  (1.778 m), weight 88 kg, SpO2 94 %. Physical Exam Constitutional:      Appearance: He is well-developed and well-nourished.  HENT:     Head: Atraumatic.  Eyes:     Extraocular Movements: EOM normal.  Cardiovascular:     Pulses: Intact distal pulses.  Pulmonary:     Effort: Pulmonary effort is normal.  Musculoskeletal:     Comments: Left hip with lateral ecchymosis and tenderness.  Mild pain with logroll.  Tenderness over the anterior pelvis.  Distally neurovascularly intact.  Skin:    General: Skin is warm and dry.  Neurological:     Mental Status: He is alert and oriented to person, place, and time.  Psychiatric:        Mood and Affect: Mood and affect normal.     Assessment/Plan: Left  superior ramus fracture  This is a stable injury and should do well with conservative management. He is okay to weight-bear as tolerated on this extremity.  He will likely need a walker for a while. Recommend PT/OT.  The patient would like to go home with his family if possible. Follow-up in my office with me in 3 weeks.  Isabella Stalling 01/18/2020, 11:31 AM

## 2020-01-18 NOTE — Evaluation (Signed)
Physical Therapy Evaluation Patient Details Name: Maxwell Fox MRN: [id number] DOB: 05-Mar-1945 Today's Date: 01/18/2020   History of Present Illness  74 y.o. male with medical history significant of chronic adrenal insufficiency, hypothyroidism, anxiety. Presenting with hip pain after fall. Imaging showed L superior ramus fracture.  Clinical Impression  Pt admitted with above diagnosis. +2 max assist for supine to sit, mod assist of 2 for sit to stand. Pt ambulated 10' with RW with increased time, distance limited by pain and fatigue.  Pt may need ST-SNF, depending on progress and family's ability to provide needed level of care. Pt currently with functional limitations due to the deficits listed below (see PT Problem List). Pt will benefit from skilled PT to increase their independence and safety with mobility to allow discharge to the venue listed below.       Follow Up Recommendations SNF;Supervision for mobility/OOB (vs. HHPT depending on progress and family support)    Equipment Recommendations  Rolling walker with 5" wheels    Recommendations for Other Services       Precautions / Restrictions Precautions Precautions: Fall Precaution Comments: pt denies other falls in past 1 year Restrictions Weight Bearing Restrictions: No      Mobility  Bed Mobility Overal bed mobility: Needs Assistance Bed Mobility: Supine to Sit     Supine to sit: Max assist;+2 for physical assistance     General bed mobility comments: +2 max assist to pivot hips with pad, support LEs and raise trunk    Transfers Overall transfer level: Needs assistance Equipment used: Rolling walker (2 wheeled) Transfers: Sit to/from Stand Sit to Stand: From elevated surface;+2 physical assistance;Mod assist         General transfer comment: VCs hand placement  Ambulation/Gait Ambulation/Gait assistance: Min guard;+2 safety/equipment Gait Distance (Feet): 10 Feet Assistive device: Rolling walker (2  wheeled) Gait Pattern/deviations: Step-to pattern;Decreased step length - left;Decreased step length - right;Trunk flexed Gait velocity: decr   General Gait Details: VCs sequencing and technique with RW, pt had difficulty advancing RLE 2* pain in L hip with WBing, heavy reliance upon BUE support to unweight LLE, VCs to lift head, HR 34 in sitting after ambulation, confirmed manually, gradually came up to 60 over several minutes, RN notified, pt asymptomatic, daughter reports pt has had cardiac work up in the past and that "they said his HR will read low and there's nothing to do about it".  Stairs            Wheelchair Mobility    Modified Rankin (Stroke Patients Only)       Balance Overall balance assessment: Needs assistance   Sitting balance-Leahy Scale: Good     Standing balance support: Bilateral upper extremity supported Standing balance-Leahy Scale: Poor Standing balance comment: relies on BUE support                             Pertinent Vitals/Pain Pain Assessment: 0-10 Pain Score: 7  Pain Location: L pelvis Pain Descriptors / Indicators: Sore Pain Intervention(s): Limited activity within patient's tolerance;Monitored during session;Premedicated before session;Patient requesting pain meds-RN notified    Home Living Family/patient expects to be discharged to:: Private residence Living Arrangements: Spouse/significant other Available Help at Discharge: Family;Available 24 hours/day Type of Home: House Home Access: Stairs to enter Entrance Stairs-Rails: Left Entrance Stairs-Number of Steps: 2 Home Layout: One level;Able to live on main level with bedroom/bathroom Home Equipment: Grab bars - toilet;Shower seat - built  in      Prior Function Level of Independence: Independent               Hand Dominance   Dominant Hand: Right    Extremity/Trunk Assessment   Upper Extremity Assessment Upper Extremity Assessment: Defer to OT  evaluation    Lower Extremity Assessment Lower Extremity Assessment: LLE deficits/detail LLE Deficits / Details: hip +2/5 limited by pain, knee ext 3/5 LLE Sensation: WNL LLE Coordination: WNL    Cervical / Trunk Assessment Cervical / Trunk Assessment: Other exceptions (forward head)  Communication   Communication: No difficulties  Cognition Arousal/Alertness: Awake/alert Behavior During Therapy: WFL for tasks assessed/performed Overall Cognitive Status: Within Functional Limits for tasks assessed                                        General Comments      Exercises General Exercises - Lower Extremity Ankle Circles/Pumps: AROM;Both;10 reps;Supine Quad Sets: AROM;Both;5 reps;Supine Heel Slides: AAROM;Left;5 reps;Supine Hip ABduction/ADduction: AAROM;Left;5 reps;Supine   Assessment/Plan    PT Assessment Patient needs continued PT services  PT Problem List Decreased mobility;Decreased activity tolerance;Decreased strength;Decreased range of motion;Pain;Decreased knowledge of use of DME       PT Treatment Interventions Gait training;Therapeutic activities;Therapeutic exercise;Stair training;Functional mobility training;Patient/family education    PT Goals (Current goals can be found in the Care Plan section)  Acute Rehab PT Goals Patient Stated Goal: return to walking 4 miles per day PT Goal Formulation: With patient/family Time For Goal Achievement: 01/18/20 Potential to Achieve Goals: Good    Frequency Min 3X/week   Barriers to discharge        Co-evaluation               AM-PAC PT "6 Clicks" Mobility  Outcome Measure Help needed turning from your back to your side while in a flat bed without using bedrails?: A Lot Help needed moving from lying on your back to sitting on the side of a flat bed without using bedrails?: A Lot Help needed moving to and from a bed to a chair (including a wheelchair)?: A Lot Help needed standing up from a  chair using your arms (e.g., wheelchair or bedside chair)?: A Lot Help needed to walk in hospital room?: A Little Help needed climbing 3-5 steps with a railing? : Total 6 Click Score: 12    End of Session Equipment Utilized During Treatment: Gait belt Activity Tolerance: Patient limited by fatigue;Patient limited by pain Patient left: in chair;with call bell/phone within reach;with family/visitor present;with chair alarm set Nurse Communication: Mobility status PT Visit Diagnosis: Difficulty in walking, not elsewhere classified (R26.2);Pain Pain - Right/Left: Left Pain - part of body: Hip    Time: 7681-1572 PT Time Calculation (min) (ACUTE ONLY): 31 min   Charges:   PT Evaluation $PT Eval Moderate Complexity: 1 Mod        Philomena Doheny PT 01/18/2020  Acute Rehabilitation Services Pager [phone] Office [phone]

## 2020-01-18 NOTE — TOC Initial Note (Signed)
Transition of Care [facility]) - Initial/Assessment Note    Patient Details  Name: Maxwell Fox MRN: [id number] Date of Birth: 03-07-1945  Transition of Care [facility]) CM/SW Contact:    Lia Hopping, [place] Phone Number: 01/18/2020, 3:56 PM  Clinical Narrative:       Patient admitted after a fall and suffered a left superior ramus hip fracture. Ortho recommended conservative management.            CSW met with the patient and his daughter [name] at bedside to discuss disposition SNF vs. Home. Patient and his daughter prefer to return home with HHPT/OT services. Patient lives with his spouse and will have support from family. CSW explain the process and provided a Medicare list of home health agencies. Daughter has requested time to review and will follow up with [facility] staff tomorrow. Patient was ambulatory without any device prior to his fall. The patient has requested a RW and 3 in1. TOC staff will place an order through [facility] prior to discharge.   TOC staff will continue to follow this patient.   Expected Discharge Plan: [place] Barriers to Discharge: Continued Medical Work up   Patient Goals and CMS Choice Patient states their goals for this hospitalization and ongoing recovery are:: to return home with family and home health PT/OT CMS Medicare.gov Compare Post Acute Care list provided to:: Patient Choice offered to / list presented to : [place]  Expected Discharge Plan and Services Expected Discharge Plan: [place] In-house Referral: Clinical Social Work Discharge Planning Services: CM Consult Post Acute Care Choice: [place] arrangements for the past 2 months: [place]                       [employer] Agency Contacted: [id number]              Prior Living Arrangements/Services Living arrangements for the past 2 months: [place] Lives with:: Self Patient language and need for  interpreter reviewed:: No Do you feel safe going back to the place where you live?: Yes      Need for Family Participation in Patient Care: Yes (Comment) Care giver support system in place?: Yes (comment)   Criminal Activity/Legal Involvement Pertinent to Current Situation/Hospitalization: No - Comment as needed  Activities of Daily Living Home Assistive Devices/Equipment: None ADL Screening (condition at time of admission) Patient's cognitive ability adequate to safely complete daily activities?: Yes Is the patient deaf or have difficulty hearing?: No Does the patient have difficulty seeing, even when wearing glasses/contacts?: No Does the patient have difficulty concentrating, remembering, or making decisions?: No Patient able to express need for assistance with ADLs?: Yes Does the patient have difficulty dressing or bathing?: Yes Independently performs ADLs?: No Communication: Independent Dressing (OT): Needs assistance Is this a change from baseline?: Change from baseline, expected to last >3 days Grooming: Independent Feeding: Independent Bathing: Needs assistance Is this a change from baseline?: Change from baseline, expected to last >3 days Toileting: Needs assistance Is this a change from baseline?: Change from baseline, expected to last >3days In/Out Bed: Needs assistance Is this a change from baseline?: Change from baseline, expected to last >3 days Walks in Home: Needs assistance Is this a change from baseline?: Change from baseline, expected to last >3 days Does the patient have difficulty walking or climbing stairs?: Yes (secondary to fracture pain) Weakness of Legs: Left Weakness of Arms/Hands: None  Permission Sought/Granted Permission sought to share information with : Case Manager,Family Chief Financial Officer Permission granted to share information with : Yes, Verbal Permission Granted  Share Information with NAME: H4fmann,Kirsi Spouse  [phone]   HKendrick RanchDaughter   [phone] Permission granted to share info w AGENCY: DME agency and home health        Emotional Assessment Appearance:: Appears stated age Attitude/Demeanor/Rapport: Engaged Affect (typically observed): Accepting Orientation: : Oriented to Self,Oriented to Place,Oriented to  Time,Oriented to Situation Alcohol / Substance Use: Not Applicable Psych Involvement: No (comment)  Admission diagnosis:  Hip fracture (HCC) [S72.009A] SOB (shortness of breath) [R06.02] Closed pelvic ring fracture, initial encounter ([facility] [S32.810A] Patient Active Problem List   Diagnosis Date Noted  . Hip fracture ([place] 01/17/2020  . Anxiety and depression 01/10/2018  . History of antineoplastic chemotherapy 07/19/2017  . Chronic midline low back pain without sciatica 04/19/2017  . Metastatic melanoma ([place] 01/07/2017  . Adrenal insufficiency due to cancer therapy ([place] 07/27/2016  . Secondary hypothyroidism 07/27/2016  . H/O gastric bypass 01/06/2016  . Pancreatic cyst 01/06/2016  . Metastatic melanoma of brain 12/31/2015  . Iron deficiency anemia   . B12 nutritional deficiency   . Hypertension   . Benign prostatic hyperplasia    PCP:  BBinnie Rail MD Pharmacy:   [facility]Phone: [phone]: 3[place] [address]Phone: [phone]: [phone]    Social Determinants of Health (SDOH) Interventions    Readmission Risk Interventions No flowsheet data found.

## 2020-01-18 NOTE — Care Management Obs Status (Signed)
[place] NOTIFICATION   Patient Details  Name: Maxwell Fox MRN: [id number] Date of Birth: February 07, 1945   Medicare Observation Status Notification Given:  Yes    Lia Hopping, [place] 01/18/2020, 3:04 PM

## 2020-01-18 NOTE — Plan of Care (Signed)

## 2020-01-19 ENCOUNTER — Other Ambulatory Visit: Payer: Self-pay

## 2020-01-19 DIAGNOSIS — S72009A Fracture of unspecified part of neck of unspecified femur, initial encounter for closed fracture: Secondary | ICD-10-CM | POA: Diagnosis not present

## 2020-01-19 DIAGNOSIS — R5381 Other malaise: Secondary | ICD-10-CM | POA: Diagnosis not present

## 2020-01-19 DIAGNOSIS — Z7401 Bed confinement status: Secondary | ICD-10-CM | POA: Diagnosis not present

## 2020-01-19 DIAGNOSIS — K5790 Diverticulosis of intestine, part unspecified, without perforation or abscess without bleeding: Secondary | ICD-10-CM | POA: Diagnosis present

## 2020-01-19 DIAGNOSIS — Z9181 History of falling: Secondary | ICD-10-CM | POA: Diagnosis not present

## 2020-01-19 DIAGNOSIS — F29 Unspecified psychosis not due to a substance or known physiological condition: Secondary | ICD-10-CM | POA: Diagnosis not present

## 2020-01-19 DIAGNOSIS — E230 Hypopituitarism: Secondary | ICD-10-CM | POA: Diagnosis not present

## 2020-01-19 DIAGNOSIS — Z20822 Contact with and (suspected) exposure to covid-19: Secondary | ICD-10-CM | POA: Diagnosis present

## 2020-01-19 DIAGNOSIS — S32810D Multiple fractures of pelvis with stable disruption of pelvic ring, subsequent encounter for fracture with routine healing: Secondary | ICD-10-CM | POA: Diagnosis not present

## 2020-01-19 DIAGNOSIS — D649 Anemia, unspecified: Secondary | ICD-10-CM | POA: Diagnosis present

## 2020-01-19 DIAGNOSIS — Z7952 Long term (current) use of systemic steroids: Secondary | ICD-10-CM | POA: Diagnosis not present

## 2020-01-19 DIAGNOSIS — Z8249 Family history of ischemic heart disease and other diseases of the circulatory system: Secondary | ICD-10-CM | POA: Diagnosis not present

## 2020-01-19 DIAGNOSIS — W19XXXD Unspecified fall, subsequent encounter: Secondary | ICD-10-CM | POA: Diagnosis not present

## 2020-01-19 DIAGNOSIS — S32592A Other specified fracture of left pubis, initial encounter for closed fracture: Secondary | ICD-10-CM | POA: Diagnosis present

## 2020-01-19 DIAGNOSIS — Z9884 Bariatric surgery status: Secondary | ICD-10-CM | POA: Diagnosis not present

## 2020-01-19 DIAGNOSIS — F419 Anxiety disorder, unspecified: Secondary | ICD-10-CM | POA: Diagnosis present

## 2020-01-19 DIAGNOSIS — Z79899 Other long term (current) drug therapy: Secondary | ICD-10-CM | POA: Diagnosis not present

## 2020-01-19 DIAGNOSIS — Y92012 Bathroom of single-family (private) house as the place of occurrence of the external cause: Secondary | ICD-10-CM | POA: Diagnosis not present

## 2020-01-19 DIAGNOSIS — M6281 Muscle weakness (generalized): Secondary | ICD-10-CM | POA: Diagnosis not present

## 2020-01-19 DIAGNOSIS — E611 Iron deficiency: Secondary | ICD-10-CM | POA: Diagnosis not present

## 2020-01-19 DIAGNOSIS — C799 Secondary malignant neoplasm of unspecified site: Secondary | ICD-10-CM | POA: Diagnosis not present

## 2020-01-19 DIAGNOSIS — M2550 Pain in unspecified joint: Secondary | ICD-10-CM | POA: Diagnosis not present

## 2020-01-19 DIAGNOSIS — N400 Enlarged prostate without lower urinary tract symptoms: Secondary | ICD-10-CM | POA: Diagnosis present

## 2020-01-19 DIAGNOSIS — W010XXA Fall on same level from slipping, tripping and stumbling without subsequent striking against object, initial encounter: Secondary | ICD-10-CM | POA: Diagnosis present

## 2020-01-19 DIAGNOSIS — Z85820 Personal history of malignant melanoma of skin: Secondary | ICD-10-CM | POA: Diagnosis not present

## 2020-01-19 DIAGNOSIS — Z888 Allergy status to other drugs, medicaments and biological substances status: Secondary | ICD-10-CM | POA: Diagnosis not present

## 2020-01-19 DIAGNOSIS — Z79890 Hormone replacement therapy (postmenopausal): Secondary | ICD-10-CM | POA: Diagnosis not present

## 2020-01-19 DIAGNOSIS — R2681 Unsteadiness on feet: Secondary | ICD-10-CM | POA: Diagnosis not present

## 2020-01-19 DIAGNOSIS — E039 Hypothyroidism, unspecified: Secondary | ICD-10-CM | POA: Diagnosis present

## 2020-01-19 DIAGNOSIS — S72002A Fracture of unspecified part of neck of left femur, initial encounter for closed fracture: Secondary | ICD-10-CM | POA: Diagnosis not present

## 2020-01-19 DIAGNOSIS — R278 Other lack of coordination: Secondary | ICD-10-CM | POA: Diagnosis not present

## 2020-01-19 DIAGNOSIS — E2740 Unspecified adrenocortical insufficiency: Secondary | ICD-10-CM | POA: Diagnosis present

## 2020-01-19 DIAGNOSIS — I10 Essential (primary) hypertension: Secondary | ICD-10-CM | POA: Diagnosis present

## 2020-01-19 DIAGNOSIS — R52 Pain, unspecified: Secondary | ICD-10-CM | POA: Diagnosis not present

## 2020-01-19 DIAGNOSIS — S32810A Multiple fractures of pelvis with stable disruption of pelvic ring, initial encounter for closed fracture: Secondary | ICD-10-CM | POA: Diagnosis not present

## 2020-01-19 DIAGNOSIS — E538 Deficiency of other specified B group vitamins: Secondary | ICD-10-CM | POA: Diagnosis not present

## 2020-01-19 DIAGNOSIS — S32512D Fracture of superior rim of left pubis, subsequent encounter for fracture with routine healing: Secondary | ICD-10-CM | POA: Diagnosis not present

## 2020-01-19 DIAGNOSIS — N289 Disorder of kidney and ureter, unspecified: Secondary | ICD-10-CM | POA: Diagnosis not present

## 2020-01-19 DIAGNOSIS — Z87891 Personal history of nicotine dependence: Secondary | ICD-10-CM | POA: Diagnosis not present

## 2020-01-19 DIAGNOSIS — Z808 Family history of malignant neoplasm of other organs or systems: Secondary | ICD-10-CM | POA: Diagnosis not present

## 2020-01-19 MED ORDER — ENSURE ENLIVE PO LIQD: 237.0000 mL | ORAL | Status: DC

## 2020-01-19 MED ORDER — ADULT MULTIVITAMIN W/MINERALS CH
1.0000 | ORAL_TABLET | Freq: Every day | ORAL | Status: DC
  Administered 2020-01-20 – 2020-01-22 (×3): 1 via ORAL
  Filled 2020-01-19 (×3): qty 1

## 2020-01-19 NOTE — Progress Notes (Signed)
PROGRESS NOTE    Maxwell Fox  QAS:[id number] DOB: 02-15-1945 DOA: 01/17/2020 PCP: [name], MD    Brief Narrative:  This 74 years old male with medical history significant for chronic adrenal insufficiency, hypothyroidism, anxiety presents in the emergency department with complaints of left hip pain status post fall.  Patient reports he slipped on the water inside the bathroom.  He denies any head injury or loss of consciousness,  He remembers the entire fall.  In the ER x-ray shows minimally displaced left superior ramus fracture.  Orthopedic consulted recommended conservative management,  PT and OT evaluation.  Assessment & Plan:   Active Problems:   Hip fracture (HCC)  Left superior ramus hip fracture: Patient presents in the ER s/p fall. X-ray shows minimally displaced left superior ramus fracture. Orthopedic consulted,  recommended conservative management. Adequate pain control, PT and OT evaluation. PT recommended a skilled nursing facility.  Patient prefers home with home services. Rolling walker and 3 in 1 has been arranged.    Chronic adrenal insuffiencey continue home steroids  Hypothyroidism continue home synthroid  Normocytic anemia no evidence of bleeding Iron studies   BPH Continue home flomax  Anxiety Resume home medications.   DVT prophylaxis: Lovenox Code Status: Full code Family Communication : Daughter at bedside Disposition Plan:   Status is: Observation  The patient remains OBS appropriate and will d/c before 2 midnights.  Dispo: The patient is from: Home              Anticipated d/c is to: SNF              Anticipated d/c date is: 1 day              Patient currently is not medically stable to d/c.  Consultants:   Orthopedics  Procedures:  None. Antimicrobials:   Anti-infectives (From admission, onward)   None      Subjective: Patient was seen and examined at bedside.  Overnight events noted.  Patient reports  having significant amount of pain while ambulation. He still feels weak and tired, states he is not ready to be discharged today.  Objective: Vitals:   01/18/20 1801 01/18/20 2146 01/19/20 0611 01/19/20 1450  BP: 126/73 111/65 132/69 122/66  Pulse: 62 65 69 63  Resp: 18 16 16 17   Temp: 98.6 F (37 C) 98.2 F (36.8 C) 98.2 F (36.8 C) 98.2 F (36.8 C)  TempSrc: Oral Oral Oral Oral  SpO2: 98% 96% 98% 99%  Weight:      Height:        Intake/Output Summary (Last 24 hours) at 01/19/2020 1516 Last data filed at 01/19/2020 0801 Gross per 24 hour  Intake 280 ml  Output 500 ml  Net -220 ml   Filed Weights   01/17/20 0855  Weight: 88 kg    Examination:  General exam: Appears calm and comfortable , not in any distress. Respiratory system: Clear to auscultation. Respiratory effort normal. Cardiovascular system: S1 & S2 heard, RRR. No JVD, murmurs, rubs, gallops or clicks. No pedal edema. Gastrointestinal system: Abdomen is nondistended, soft and nontender. No organomegaly or masses felt. Normal bowel sounds heard. Central nervous system: Alert and oriented. No focal neurological deficits. Extremities: No edema, no cyanosis, no clubbing. Tenderness noted in the pubic region. Skin: No rashes, lesions or ulcers Psychiatry: Judgement and insight appear normal. Mood & affect appropriate.     Data Reviewed: I have personally reviewed following labs and imaging studies  CBC: Recent  Labs  Lab 01/17/20 0902  WBC 8.4  NEUTROABS 6.2  HGB 12.3*  HCT 38.6*  MCV 91.9  PLT 301*   Basic Metabolic Panel: Recent Labs  Lab 01/17/20 0902  NA 143  K 4.1  CL 107  CO2 28  GLUCOSE 86  BUN 18  CREATININE 0.76  CALCIUM 9.3   GFR: Estimated Creatinine Clearance: 90.5 mL/min (by C-G formula based on SCr of 0.76 mg/dL). Liver Function Tests: Recent Labs  Lab 01/17/20 0902  AST 28  ALT 20  ALKPHOS 45  BILITOT 0.7  PROT 5.5*  ALBUMIN 3.2*   No results for input(s): LIPASE,  AMYLASE in the last 168 hours. No results for input(s): AMMONIA in the last 168 hours. Coagulation Profile: No results for input(s): INR, PROTIME in the last 168 hours. Cardiac Enzymes: No results for input(s): CKTOTAL, CKMB, CKMBINDEX, TROPONINI in the last 168 hours. BNP (last 3 results) No results for input(s): PROBNP in the last 8760 hours. HbA1C: No results for input(s): HGBA1C in the last 72 hours. CBG: Recent Labs  Lab 01/17/20 0902  GLUCAP 77   Lipid Profile: No results for input(s): CHOL, HDL, LDLCALC, TRIG, CHOLHDL, LDLDIRECT in the last 72 hours. Thyroid Function Tests: No results for input(s): TSH, T4TOTAL, FREET4, T3FREE, THYROIDAB in the last 72 hours. Anemia Panel: Recent Labs    01/17/20 1139  TIBC 187*  IRON 48   Sepsis Labs: No results for input(s): PROCALCITON, LATICACIDVEN in the last 168 hours.  Recent Results (from the past 240 hour(s))  Resp Panel by RT-PCR (Flu A&B, Covid) Nasopharyngeal Swab     Status: None   Collection Time: 01/17/20  9:08 AM   Specimen: Nasopharyngeal Swab; Nasopharyngeal(NP) swabs in vial transport medium  Result Value Ref Range Status   SARS Coronavirus 2 by RT PCR NEGATIVE NEGATIVE Final    Comment: (NOTE) SARS-CoV-2 target nucleic acids are NOT DETECTED.  The SARS-CoV-2 RNA is generally detectable in upper respiratory specimens during the acute phase of infection. The lowest concentration of SARS-CoV-2 viral copies this assay can detect is 138 copies/mL. A negative result does not preclude SARS-Cov-2 infection and should not be used as the sole basis for treatment or other patient management decisions. A negative result may occur with  improper specimen collection/handling, submission of specimen other than nasopharyngeal swab, presence of viral mutation(s) within the areas targeted by this assay, and inadequate number of viral copies(<138 copies/mL). A negative result must be combined with clinical observations,  patient history, and epidemiological information. The expected result is Negative.  Fact Sheet for Patients:  EntrepreneurPulse.com.au  Fact Sheet for Healthcare Providers:  IncredibleEmployment.be  This test is no t yet approved or cleared by the [place] FDA and  has been authorized for detection and/or diagnosis of SARS-CoV-2 by FDA under an Emergency Use Authorization (EUA). This EUA will remain  in effect (meaning this test can be used) for the duration of the COVID-19 declaration under Section 564(b)(1) of the Act, 21 U.S.C.section 360bbb-3(b)(1), unless the authorization is terminated  or revoked sooner.       Influenza A by PCR NEGATIVE NEGATIVE Final   Influenza B by PCR NEGATIVE NEGATIVE Final    Comment: (NOTE) The Xpert Xpress SARS-CoV-2/FLU/RSV plus assay is intended as an aid in the diagnosis of influenza from Nasopharyngeal swab specimens and should not be used as a sole basis for treatment. Nasal washings and aspirates are unacceptable for Xpert Xpress SARS-CoV-2/FLU/RSV testing.  Fact Sheet for Patients: EntrepreneurPulse.com.au  Fact Sheet for Healthcare Providers: IncredibleEmployment.be  This test is not yet approved or cleared by the [place] FDA and has been authorized for detection and/or diagnosis of SARS-CoV-2 by FDA under an Emergency Use Authorization (EUA). This EUA will remain in effect (meaning this test can be used) for the duration of the COVID-19 declaration under Section 564(b)(1) of the Act, 21 U.S.C. section 360bbb-3(b)(1), unless the authorization is terminated or revoked.  Performed at [facility], [place], [place] [address], [place], [place] [address]      Radiology Studies: No results found.  Scheduled Meds: . clonazePAM  1 mg Oral q AM  . docusate sodium  100 mg Oral BID  . enoxaparin (LOVENOX) injection  40 mg Subcutaneous Q24H  .  escitalopram  10 mg Oral QHS  . [START ON 01/20/2020] feeding supplement  237 mL Oral Q24H  . levothyroxine  88 mcg Oral Q0600  . [START ON 01/20/2020] multivitamin with minerals  1 tablet Oral Daily  . predniSONE  5 mg Oral Q breakfast  . tamsulosin  0.4 mg Oral QPC supper   Continuous Infusions: . sodium chloride 10 mL/hr at 01/17/20 1006     LOS: 0 days    Time spent: 25 mins    Ash Mcelwain, MD [facility]   If 7PM-7AM, please contact night-coverage

## 2020-01-19 NOTE — Progress Notes (Signed)
Initial Nutrition Assessment  DOCUMENTATION CODES:   Not applicable  INTERVENTION:  - will order Ensure Enlive once/day, each supplement provides 350 kcal and 20 grams of protein. - will order Magic Cup with dinner meals, each supplement provides 290 kcal and 9 grams of protein - will order 1 tablet multivitamin with minerals/day. - complete NFPE at follow-up.   NUTRITION DIAGNOSIS:   Increased nutrient needs related to acute illness as evidenced by estimated needs.  GOAL:   Patient will meet greater than or equal to 90% of their needs  MONITOR:   PO intake,Supplement acceptance,Labs,Weight trends  REASON FOR ASSESSMENT:   Consult Hip fracture protocol  ASSESSMENT:   74 year-old male with medical history of chronic adrenal insufficiency, iron deficiency anemia, BPH, HTN, melanoma, diverticulosis, hypothyroidism, and anxiety. He presented to the ED with L hip pain s/p fall after slipping on water inside of his bathroom. X-ray in the ED showed minimally displaced left superior ramus fracture. Orthopedics recommended conservative management,  PT and OT evaluation.  Unable to talk with patient at the time of attempted visit. Per chart review, he consumed 50% of breakfast, 75% of lunch, and 25% of dinner yesterday (total of 450 kcal, 20 grams protein). No intakes documented from breakfast or lunch today; both meals have been ordered and delivered and dinner order has already been placed.   MST score of 0.  Patient was OBV but switched to inpatient status earlier this afternoon.   Weight on admission date of 12/15 was 194 lb and PTA the most recently documented weight was on 08/18/19 when he weighed 195 lb. Prior to that, most recent weight was on 02/17/19 when he weighed 188 lb.    Labs reviewed. Medications reviewed; 100 mg colace BID, 88 mcg oral synthroid/day, 5 mg deltasone/day.     NUTRITION - FOCUSED PHYSICAL EXAM:  unable to complete at this time.   Diet Order:    Diet Order            Diet Heart Room service appropriate? Yes; Fluid consistency: Thin  Diet effective now                 EDUCATION NEEDS:   No education needs have been identified at this time  Skin:  Skin Assessment: Reviewed RN Assessment  Last BM:  12/14  Height:   Ht Readings from Last 1 Encounters:  01/17/20 5\' 10"  (1.778 m)    Weight:   Wt Readings from Last 1 Encounters:  01/17/20 88 kg    Estimated Nutritional Needs:  Kcal:  1760-1950 kcal Protein:  80-95 grams Fluid:  >/= 2.2 L/day      Jarome Matin, MS, RD, LDN, CNSC Inpatient Clinical Dietitian RD pager # available in [facility]  After hours/weekend pager # available in [facility]

## 2020-01-19 NOTE — Progress Notes (Signed)
Occupational Therapy Treatment Patient Details Name: Maxwell Fox MRN: [id number] DOB: 1945-10-15 Today's Date: 01/19/2020    History of present illness 74 y.o. male with medical history significant of chronic adrenal insufficiency, hypothyroidism, anxiety. Presenting with hip pain after fall. Imaging showed L superior ramus fracture.   OT comments  Treatment focused on functional mobility and improving ability to participate in self care tasks. Patient demonstrated ability to ambulate to bathroom - slowly, with RW and min guard but fatigued quickly. Patient diaphoretic and HR 39 and stating "I'm pooped" after ambulation. Patient performed grooming and bathing task seated on BSC - unable to stand at sink. Patient still needs significant assistance for LB ADLs and toileting. Patient mod assist to stand from [facility] and mod assist to take steps to transfer to recliner - that was pulled very close due to patient's fatigue. Patient needing more assistance with walker management and exhibited decreased use of UEs on walker and not following verbal instructions well with significant fatigue. At this time patient does not have the physical abilities to return home safely and would benefit from short term rehab at discharge. Patient agrees that he couldn't manage himself at home.   Follow Up Recommendations  SNF    Equipment Recommendations  3 in 1 bedside commode    Recommendations for Other Services      Precautions / Restrictions Precautions Precautions: Fall Precaution Comments: pt denies other falls in past 1 year, slipped in bathroom Restrictions Weight Bearing Restrictions: No       Mobility Bed Mobility Overal bed mobility: Needs Assistance Bed Mobility: Supine to Sit     Supine to sit: Max assist;HOB elevated     General bed mobility comments: Patient attempts to assist with pseudo leg lift but needed therapist to physically assist to get LLE to side of bed. Patient using bed  rails to assist with manuevering but still needed help with trunk negotiation, assistance with both LEs and pivot to edge of bed.  Transfers Overall transfer level: Needs assistance Equipment used: Rolling walker (2 wheeled) Transfers: Sit to/from Stand Sit to Stand: Min guard;From elevated surface         General transfer comment: Min guard with RW to stand from elevated bed height. Patient ambulated to bathroom with RW with verbal cues on technique and very slowly. Patient reports "I'm sweating" and appears diaphoretic once seated on BSC over toilet. Therapist monitored HR and sat - HR down to 39 after ambulation. Patient mod assist to stand from [facility] and mod assist to take steps - heavy verbal cues and walker management - to transfer and sit down to recliner (brough really close). With significant fatigue patient exhibited difficulty following commands and  using upper extremities on walker to assist with weight bearing tolerance.    Balance   Sitting-balance support: No upper extremity supported;Feet supported Sitting balance-Leahy Scale: Good     Standing balance support: Bilateral upper extremity supported Standing balance-Leahy Scale: Poor Standing balance comment: relies on BUE support                           ADL either performed or assessed with clinical judgement   ADL Overall ADL's : Needs assistance/impaired     Grooming: Set up;Wash/dry face;Sitting Grooming Details (indicate cue type and reason): Patient performed grooming seated on BSC Upper Body Bathing: Set up;Sitting Upper Body Bathing Details (indicate cue type and reason): Patient performed UB bathing seated on BSC Lower  Body Bathing: Maximal assistance;Sit to/from stand Lower Body Bathing Details (indicate cue type and reason): Patient able to wash periarea and top of legs, but needed max assist for lower legs and buttocks.         Toilet Transfer: Stand-pivot;RW;Ambulation;Moderate  assistance;Min guard Toilet Transfer Details (indicate cue type and reason): Patient ambulated to bathroom from bed to [facility] plaved over toilet. Patient min guard for transfer into sitting with verbal cues for technique. Patient mod assist to [facility] from [facility]. Toileting- Water quality scientist and Hygiene: Sit to/from stand;Total assistance Toileting - Clothing Manipulation Details (indicate cue type and reason): Reliant on UEs and unable to assist with clothing management and cleaning.             Vision Patient Visual Report: No change from baseline     Perception     Praxis      Cognition Arousal/Alertness: Awake/alert Behavior During Therapy: WFL for tasks assessed/performed Overall Cognitive Status: Within Functional Limits for tasks assessed                                          Exercises     Shoulder Instructions       General Comments      Pertinent Vitals/ Pain       Pain Assessment: 0-10 Pain Score: 8  Pain Location: L pelvis Pain Descriptors / Indicators: Sore;Guarding;Aching Pain Intervention(s): Monitored during session;Premedicated before session  Home Living                                          Prior Functioning/Environment              Frequency  Min 2X/week        Progress Toward Goals  OT Goals(current goals can now be found in the care plan section)  Progress towards OT goals: Progressing toward goals  Acute Rehab OT Goals Patient Stated Goal: return to walking 4 miles per day OT Goal Formulation: With patient Time For Goal Achievement: 02/01/20 Potential to Achieve Goals: Good  Plan Discharge plan needs to be updated    Co-evaluation          OT goals addressed during session: ADL's and self-care (functional mobility)      AM-PAC OT "6 Clicks" Daily Activity     Outcome Measure   Help from another person eating meals?: None Help from another person taking care of personal  grooming?: A Little Help from another person toileting, which includes using toliet, bedpan, or urinal?: Total Help from another person bathing (including washing, rinsing, drying)?: A Lot Help from another person to put on and taking off regular upper body clothing?: A Little Help from another person to put on and taking off regular lower body clothing?: Total 6 Click Score: 14    End of Session Equipment Utilized During Treatment: Gait belt;Rolling walker  OT Visit Diagnosis: Unsteadiness on feet (R26.81);Other abnormalities of gait and mobility (R26.89);History of falling (Z91.81);Pain Pain - Right/Left: Left Pain - part of body: Hip   Activity Tolerance Patient limited by pain;Patient limited by fatigue   Patient Left in chair;with call bell/phone within reach;with chair alarm set;with family/visitor present   Nurse Communication Mobility status (HR and patient diaphoretic, need SNF at discharge.)  Time: 2277-3750 OT Time Calculation (min): 32 min  Charges: OT General Charges $OT Visit: 1 Visit OT Treatments $Self Care/Home Management : 8-22 mins $Therapeutic Activity: 8-22 mins  Derl Barrow, OTR/L [place] [phone] Pager: [place] 01/19/2020, 12:53 PM

## 2020-01-19 NOTE — TOC Progression Note (Signed)
Transition of Care [facility]) - Progression Note    Patient Details  Name: Maxwell Fox MRN: [id number] Date of Birth: 02-20-1945  Transition of Care [facility]) CM/SW Contact  Lennart Pall, LCSW Phone Number: 01/19/2020, 3:59 PM  Clinical Narrative:    Met with pt and spouse this afternoon to further discuss dc plans.  Pt reports he did not have a "good day" with therapies today and not feeling like he is doing well enough to dc directly home.  Pt and wife would like to pursue short term SNF rehab.  Have reviewed area facilities and no preference.  Will begin bed search.   Expected Discharge Plan: [place] Barriers to Discharge: Continued Medical Work up  Expected Discharge Plan and Services Expected Discharge Plan: [place] In-house Referral: Clinical Social Work Discharge Planning Services: CM Consult Post Acute Care Choice: [place] arrangements for the past 2 months: [place]                       Time DME Agency Contacted: [phone]               Social Determinants of Health (SDOH) Interventions    Readmission Risk Interventions No flowsheet data found.

## 2020-01-19 NOTE — Progress Notes (Signed)
Physical Therapy Treatment Patient Details Name: Maxwell Fox MRN: [id number] DOB: 07-06-1945 Today's Date: 01/19/2020    History of Present Illness 74 y.o. male with medical history significant of chronic adrenal insufficiency, hypothyroidism, anxiety. Presenting with hip pain after fall. Imaging showed L superior ramus fracture.    PT Comments    Pt OOB in recliner with daughter in room.  General transfer comment: pt was unable to rise from lower height recliner with + 1 assist.  Pt required + 2 side by side "power up" assist from recliner.  Poor flex posture and severe posterior lean.  Used B platform EVA walker for support as pt was unable to fully WB thru L LE due to pain. General Gait Details: due to difficulty amb this morning with OT.  Used b Designer, television/film set for his PT session.  Heavy lean on walker esp during stance phase of L LE.  MAX c/o posterior L hip pain.  Pt struggled to make it from recliner that was parked in hallway to bed. General bed mobility comments: pt required MAX/Total Assist to support b LE up onto bed and to scoot to [facility] due to pain and inability to lift L LE After witnessing session Pt and daughter agree, pt is unable to return home Pt will need ST Rehab at SNF to regain prior level of mobility.   Follow Up Recommendations  SNF     Equipment Recommendations       Recommendations for Other Services       Precautions / Restrictions Precautions Precautions: Fall Precaution Comments: pt denies other falls in past 1 year, slipped in bathroom Restrictions Weight Bearing Restrictions: No    Mobility  Bed Mobility Overal bed mobility: Needs Assistance Bed Mobility: Sit to Supine     Supine to sit: Max assist;HOB elevated Sit to supine: Max assist;Total assist   General bed mobility comments: pt required MAX/Total Assist to support b LE up onto bed and to scoot to [facility] due to pain and inability to lift L LE  Transfers Overall transfer level: Needs  assistance Equipment used: Bilateral platform walker (EVA walker) Transfers: Sit to/from Stand Sit to Stand: Min guard;From elevated surface         General transfer comment: pt was unable to rise from lower height recliner with + 1 assist.  Pt required + 2 side by side "power up" assist from recliner.  Poor flex posture and severe posterior lean.  Used B platform EVA walker for support as pt was unable to fully WB thru L LE due to pain.  Ambulation/Gait Ambulation/Gait assistance: Max assist;Total assist;+2 safety/equipment Gait Distance (Feet): 9 Feet Assistive device: Bilateral platform walker (EVA walker) Gait Pattern/deviations: Step-to pattern;Decreased step length - left;Decreased step length - right;Trunk flexed Gait velocity: decr   General Gait Details: due to difficulty amb this morning with OT.  Used b Designer, television/film set for his PT session.  Heavy lean on walker esp during stance phase of L LE.  MAX c/o posterior L hip pain.  Pt struggled to make it from recliner that was parked in hallway to bed.   Stairs             Wheelchair Mobility    Modified Rankin (Stroke Patients Only)       Balance   Sitting-balance support: No upper extremity supported;Feet supported Sitting balance-Leahy Scale: Good     Standing balance support: Bilateral upper extremity supported Standing balance-Leahy Scale: Poor Standing balance comment: relies on BUE  support                            Cognition Arousal/Alertness: Awake/alert Behavior During Therapy: WFL for tasks assessed/performed Overall Cognitive Status: Within Functional Limits for tasks assessed                                 General Comments: AxO x 3 pleasant but not fully understanding extent of his Fx      Exercises      General Comments        Pertinent Vitals/Pain Pain Assessment: Faces Pain Score: 8  Faces Pain Scale: Hurts whole lot Pain Location: L pelvis with  WBing/amb Pain Descriptors / Indicators: Sore;Guarding;Aching;Radiating;Throbbing Pain Intervention(s): Monitored during session;Premedicated before session;Repositioned    Home Living                      Prior Function            PT Goals (current goals can now be found in the care plan section) Acute Rehab PT Goals Patient Stated Goal: return to walking 4 miles per day Progress towards PT goals: Progressing toward goals    Frequency    Min 3X/week      PT Plan Current plan remains appropriate    Co-evaluation       OT goals addressed during session: ADL's and self-care (functional mobility)      AM-PAC PT "6 Clicks" Mobility   Outcome Measure  Help needed turning from your back to your side while in a flat bed without using bedrails?: A Lot Help needed moving from lying on your back to sitting on the side of a flat bed without using bedrails?: A Lot Help needed moving to and from a bed to a chair (including a wheelchair)?: A Lot Help needed standing up from a chair using your arms (e.g., wheelchair or bedside chair)?: A Lot Help needed to walk in hospital room?: A Lot Help needed climbing 3-5 steps with a railing? : Total 6 Click Score: 11    End of Session Equipment Utilized During Treatment: Gait belt Activity Tolerance: Patient limited by fatigue;Patient limited by pain Patient left: in bed;with call bell/phone within reach;with bed alarm set;with nursing/sitter in room Nurse Communication: Mobility status PT Visit Diagnosis: Difficulty in walking, not elsewhere classified (R26.2);Pain Pain - Right/Left: Left Pain - part of body: Hip     Time: 5183-3582 PT Time Calculation (min) (ACUTE ONLY): 26 min  Charges:  $Gait Training: 8-22 mins $Therapeutic Activity: 8-22 mins                     Rica Koyanagi  PTA Acute  Rehabilitation Services Pager      [phone] Office      [phone]

## 2020-01-19 NOTE — NC FL2 (Signed)
[place] LEVEL OF CARE SCREENING TOOL     IDENTIFICATION  Patient Name: Maxwell Fox Birthdate: 02/13/1945 Sex: male Admission Date (Current Location): 01/17/2020  [facility] and [place] Number:  Herbalist and Address:  [facility],  [place], [place]      Provider Number: [id number]  Attending Physician Name and Address:  Shawna Clamp, MD  Relative Name and Phone Number:  wife, Thera Flake @ [phone]    Current Level of Care: Hospital Recommended Level of Care: [place] Prior Approval Number:    Date Approved/Denied:   PASRR Number: [id number]  Discharge Plan: SNF    Current Diagnoses: Patient Active Problem List   Diagnosis Date Noted  . Hip fracture ([place]) 01/17/2020  . Anxiety and depression 01/10/2018  . History of antineoplastic chemotherapy 07/19/2017  . Chronic midline low back pain without sciatica 04/19/2017  . Metastatic melanoma ([place]) 01/07/2017  . Adrenal insufficiency due to cancer therapy ([place]) 07/27/2016  . Secondary hypothyroidism 07/27/2016  . H/O gastric bypass 01/06/2016  . Pancreatic cyst 01/06/2016  . Metastatic melanoma of brain 12/31/2015  . Iron deficiency anemia   . B12 nutritional deficiency   . Hypertension   . Benign prostatic hyperplasia     Orientation RESPIRATION BLADDER Height & Weight     Self,Time,Situation,Place  Normal Continent Weight: 194 lb 0.1 oz (88 kg) Height:  5\' 10"  (177.8 cm)  BEHAVIORAL SYMPTOMS/MOOD NEUROLOGICAL BOWEL NUTRITION STATUS      Continent    AMBULATORY STATUS COMMUNICATION OF NEEDS Skin   Extensive Assist   Surgical wounds                       Personal Care Assistance Level of Assistance  Bathing,Dressing Bathing Assistance: Limited assistance   Dressing Assistance: Limited assistance     Functional Limitations Info             SPECIAL CARE FACTORS FREQUENCY  PT (By licensed PT),OT (By licensed OT)      PT Frequency: 5x/wk OT Frequency: 5x/wk            Contractures Contractures Info: Not present    Additional Factors Info  Code Status,Allergies,Psychotropic Code Status Info: Full Allergies Info: see MAR Psychotropic Info: see MAR         Current Medications (01/19/2020):  This is the current hospital active medication list Current Facility-Administered Medications  Medication Dose Route Frequency Provider Last Rate Last Admin  . 0.9 %  sodium chloride infusion   Intravenous Continuous Lacretia Leigh, MD 10 mL/hr at 01/17/20 1006 New Bag at 01/17/20 1006  . clonazePAM (KLONOPIN) tablet 1 mg  1 mg Oral q AM Kyle, Tyrone A, DO   1 mg at 01/19/20 0759  . docusate sodium (COLACE) capsule 100 mg  100 mg Oral BID Marylyn Ishihara, Tyrone A, DO   100 mg at 01/19/20 1006  . enoxaparin (LOVENOX) injection 40 mg  40 mg Subcutaneous Q24H Kyle, Tyrone A, DO   40 mg at 01/18/20 1730  . escitalopram (LEXAPRO) tablet 10 mg  10 mg Oral QHS Kyle, Tyrone A, DO   10 mg at 01/18/20 2155  . [START ON 01/20/2020] feeding supplement (ENSURE ENLIVE / ENSURE PLUS) liquid 237 mL  237 mL Oral Q24H Shawna Clamp, MD      . HYDROcodone-acetaminophen (NORCO/VICODIN) 5-325 MG per tablet 1-2 tablet  1-2 tablet Oral Q6H PRN Marylyn Ishihara, Tyrone A, DO   2 tablet at  01/19/20 1506  . HYDROmorphone (DILAUDID) injection 0.5 mg  0.5 mg Intravenous Q2H PRN Marylyn Ishihara, Tyrone A, DO   0.5 mg at 01/19/20 1135  . levothyroxine (SYNTHROID) tablet 88 mcg  88 mcg Oral Q0600 Cherylann Ratel A, DO   88 mcg at 01/19/20 0631  . magnesium hydroxide (MILK OF MAGNESIA) suspension 30 mL  30 mL Oral Daily PRN Marylyn Ishihara, Tyrone A, DO      . [START ON 01/20/2020] multivitamin with minerals tablet 1 tablet  1 tablet Oral Daily Shawna Clamp, MD      . predniSONE (DELTASONE) tablet 5 mg  5 mg Oral Q breakfast Kyle, Tyrone A, DO   5 mg at 01/19/20 0759  . tamsulosin (FLOMAX) capsule 0.4 mg  0.4 mg Oral QPC supper Marylyn Ishihara, Tyrone A, DO   0.4 mg at 01/18/20 1730      Discharge Medications: Please see discharge summary for a list of discharge medications.  Relevant Imaging Results:  Relevant Lab Results:   Additional Information SS# [id number]  Lennart Pall, LCSW

## 2020-01-20 NOTE — Progress Notes (Signed)
Physical Therapy Treatment Patient Details Name: Maxwell Fox MRN: [id number] DOB: [birth year] Today's Date: 01/20/2020    History of Present Illness 74 y.o. male with medical history significant of chronic adrenal insufficiency, hypothyroidism, anxiety. Presenting with hip pain after fall. Imaging showed L superior ramus fracture.    PT Comments    Pt tolerated increased ambulation distance of 66' with EVA walker (B platform), distance limited by pain and fatigue. Performed LLE strengthening exercises, pt puts forth good effort.   Follow Up Recommendations  SNF     Equipment Recommendations  Rolling walker with 5" wheels;Wheelchair (measurements PT);Wheelchair cushion (measurements PT)    Recommendations for Other Services       Precautions / Restrictions Precautions Precautions: Fall Precaution Comments: pt denies other falls in past 1 year, slipped in bathroom Restrictions Weight Bearing Restrictions: No    Mobility  Bed Mobility Overal bed mobility: Needs Assistance Bed Mobility: Supine to Sit     Supine to sit: Mod assist     General bed mobility comments: pt self advanced LLE towards edge of bed with gait belt looped around left foot, mod A to raise trunk and pivot hips to edge of bed  Transfers Overall transfer level: Needs assistance Equipment used: Bilateral platform walker Transfers: Sit to/from Stand Sit to Stand: From elevated surface;Mod assist         General transfer comment: +2 max A from recliner, +2 mod A from elevated bed, assist to shift weight anteriorly in standing with EVA RW  Ambulation/Gait Ambulation/Gait assistance: Min guard;Min assist Gait Distance (Feet): 35 Feet Assistive device: Bilateral platform walker Gait Pattern/deviations: Step-to pattern;Decreased step length - left;Decreased step length - right;Trunk flexed Gait velocity: decr   General Gait Details: VCs for posture, min A to Doctor, general practice    Modified Rankin (Stroke Patients Only)       Balance   Sitting-balance support: No upper extremity supported;Feet supported Sitting balance-Leahy Scale: Good     Standing balance support: Bilateral upper extremity supported Standing balance-Leahy Scale: Poor Standing balance comment: relies on BUE support                            Cognition Arousal/Alertness: Awake/alert Behavior During Therapy: WFL for tasks assessed/performed Overall Cognitive Status: Within Functional Limits for tasks assessed                                        Exercises General Exercises - Lower Extremity Ankle Circles/Pumps: AROM;Both;10 reps;Supine Heel Slides: AAROM;Left;Supine;10 reps Hip ABduction/ADduction: AAROM;Left;Supine;10 reps    General Comments        Pertinent Vitals/Pain Pain Score: 8  Pain Location: L pelvis with WBing/amb Pain Descriptors / Indicators: Sore;Guarding;Aching;Radiating;Throbbing Pain Intervention(s): Limited activity within patient's tolerance;Monitored during session;Premedicated before session;Ice applied    Home Living                      Prior Function            PT Goals (current goals can now be found in the care plan section) Acute Rehab PT Goals Patient Stated Goal: return to walking 4 miles per day PT Goal Formulation: With patient/family Time For Goal Achievement: 01/18/20 Potential to Achieve Goals: Good Progress towards  PT goals: Progressing toward goals    Frequency    Min 3X/week      PT Plan Current plan remains appropriate    Co-evaluation              AM-PAC PT "6 Clicks" Mobility   Outcome Measure  Help needed turning from your back to your side while in a flat bed without using bedrails?: A Lot Help needed moving from lying on your back to sitting on the side of a flat bed without using bedrails?: A Lot Help needed moving to and from a bed to a  chair (including a wheelchair)?: A Lot Help needed standing up from a chair using your arms (e.g., wheelchair or bedside chair)?: A Lot Help needed to walk in hospital room?: A Lot Help needed climbing 3-5 steps with a railing? : Total 6 Click Score: 11    End of Session Equipment Utilized During Treatment: Gait belt Activity Tolerance: Patient limited by fatigue;Patient limited by pain Patient left: with call bell/phone within reach;with family/visitor present;in chair;with chair alarm set Nurse Communication: Mobility status PT Visit Diagnosis: Difficulty in walking, not elsewhere classified (R26.2);Pain Pain - Right/Left: Left Pain - part of body: Hip     Time: 0539-7673 PT Time Calculation (min) (ACUTE ONLY): 32 min  Charges:  $Gait Training: 8-22 mins $Therapeutic Exercise: 8-22 mins                     Blondell Reveal Kistler PT 01/20/2020  Acute Rehabilitation Services Pager [phone] Office [phone]

## 2020-01-20 NOTE — Progress Notes (Signed)
PROGRESS NOTE    Maxwell Fox  NFA:[id number] DOB: November 22, 1945 DOA: 01/17/2020 PCP: [name], MD    Brief Narrative:  This 74 years old male with medical history significant for chronic adrenal insufficiency, hypothyroidism, anxiety presents in the emergency department with complaints of left hip pain status post fall.  Patient reports he slipped on the water inside the bathroom.  He denies any head injury or loss of consciousness,  He remembers the entire fall.  In the ER x-ray shows minimally displaced left superior ramus fracture.  Orthopedic consulted recommended conservative management,  PT and OT evaluation.  Assessment & Plan:   Active Problems:   Hip fracture (HCC)  Left superior ramus hip fracture: Patient presents in the ER s/p fall. X-ray shows minimally displaced left superior ramus fracture. Orthopedic consulted,  recommended conservative management. Adequate pain control, PT and OT evaluation. PT recommended skilled nursing facility.  Patient prefers home with home services. Rolling walker and 3 in 1 has been arranged. Patient continues to have significant amount of pain feeling weak after ambulation. Patient has decided for skilled nursing facility before going home. Awaiting insurance authorization for SNF placement.    Chronic adrenal insuffiencey continue home steroids.  Hypothyroidism continue home synthroid.  Normocytic anemia no evidence of bleeding Iron studies WNL  BPH Continue home flomax.  Anxiety Resume home medications.   DVT prophylaxis: Lovenox Code Status: Full code Family Communication : Daughter at bedside Disposition Plan:    Status is: Inpatient  Remains inpatient appropriate because:Inpatient level of care appropriate due to severity of illness   Dispo: The patient is from: Home              Anticipated d/c is to: SNF              Anticipated d/c date is: 2 days              Patient currently is not medically  stable to d/c.  Consultants:   Orthopedics  Procedures:  None. Antimicrobials:   Anti-infectives (From admission, onward)   None      Subjective: Patient was seen and examined at bedside.  Overnight events noted.  Patient reports having significant amount of pain while ambulation. He still feels weak and tired, decided go to nursing home before going home.  Objective: Vitals:   01/18/20 2146 01/19/20 0611 01/19/20 1450 01/20/20 0616  BP: 111/65 132/69 122/66 (!) 114/59  Pulse: 65 69 63 (!) 56  Resp: 16 16 17 16   Temp: 98.2 F (36.8 C) 98.2 F (36.8 C) 98.2 F (36.8 C) 98 F (36.7 C)  TempSrc: Oral Oral Oral Oral  SpO2: 96% 98% 99% 94%  Weight:      Height:        Intake/Output Summary (Last 24 hours) at 01/20/2020 1456 Last data filed at 01/20/2020 1320 Gross per 24 hour  Intake 660 ml  Output 1150 ml  Net -490 ml   Filed Weights   01/17/20 0855  Weight: 88 kg    Examination:  General exam: Appears calm and comfortable , not in any distress. Respiratory system: Clear to auscultation. Respiratory effort normal. Cardiovascular system: S1 & S2 heard, RRR. No JVD, murmurs, rubs, gallops or clicks. No pedal edema. Gastrointestinal system: Abdomen is nondistended, soft and nontender. No organomegaly or masses felt. Normal bowel sounds heard. Central nervous system: Alert and oriented. No focal neurological deficits. Extremities: No edema, no cyanosis, no clubbing. Tenderness noted in the pubic region. Skin: No  rashes, lesions or ulcers Psychiatry: Judgement and insight appear normal. Mood & affect appropriate.     Data Reviewed: I have personally reviewed following labs and imaging studies  CBC: Recent Labs  Lab 01/17/20 0902  WBC 8.4  NEUTROABS 6.2  HGB 12.3*  HCT 38.6*  MCV 91.9  PLT 829*   Basic Metabolic Panel: Recent Labs  Lab 01/17/20 0902  NA 143  K 4.1  CL 107  CO2 28  GLUCOSE 86  BUN 18  CREATININE 0.76  CALCIUM 9.3    GFR: Estimated Creatinine Clearance: 90.5 mL/min (by C-G formula based on SCr of 0.76 mg/dL). Liver Function Tests: Recent Labs  Lab 01/17/20 0902  AST 28  ALT 20  ALKPHOS 45  BILITOT 0.7  PROT 5.5*  ALBUMIN 3.2*   No results for input(s): LIPASE, AMYLASE in the last 168 hours. No results for input(s): AMMONIA in the last 168 hours. Coagulation Profile: No results for input(s): INR, PROTIME in the last 168 hours. Cardiac Enzymes: No results for input(s): CKTOTAL, CKMB, CKMBINDEX, TROPONINI in the last 168 hours. BNP (last 3 results) No results for input(s): PROBNP in the last 8760 hours. HbA1C: No results for input(s): HGBA1C in the last 72 hours. CBG: Recent Labs  Lab 01/17/20 0902  GLUCAP 77   Lipid Profile: No results for input(s): CHOL, HDL, LDLCALC, TRIG, CHOLHDL, LDLDIRECT in the last 72 hours. Thyroid Function Tests: No results for input(s): TSH, T4TOTAL, FREET4, T3FREE, THYROIDAB in the last 72 hours. Anemia Panel: No results for input(s): VITAMINB12, FOLATE, FERRITIN, TIBC, IRON, RETICCTPCT in the last 72 hours. Sepsis Labs: No results for input(s): PROCALCITON, LATICACIDVEN in the last 168 hours.  Recent Results (from the past 240 hour(s))  Resp Panel by RT-PCR (Flu A&B, Covid) Nasopharyngeal Swab     Status: None   Collection Time: 01/17/20  9:08 AM   Specimen: Nasopharyngeal Swab; Nasopharyngeal(NP) swabs in vial transport medium  Result Value Ref Range Status   SARS Coronavirus 2 by RT PCR NEGATIVE NEGATIVE Final    Comment: (NOTE) SARS-CoV-2 target nucleic acids are NOT DETECTED.  The SARS-CoV-2 RNA is generally detectable in upper respiratory specimens during the acute phase of infection. The lowest concentration of SARS-CoV-2 viral copies this assay can detect is 138 copies/mL. A negative result does not preclude SARS-Cov-2 infection and should not be used as the sole basis for treatment or other patient management decisions. A negative result  may occur with  improper specimen collection/handling, submission of specimen other than nasopharyngeal swab, presence of viral mutation(s) within the areas targeted by this assay, and inadequate number of viral copies(<138 copies/mL). A negative result must be combined with clinical observations, patient history, and epidemiological information. The expected result is Negative.  Fact Sheet for Patients:  EntrepreneurPulse.com.au  Fact Sheet for Healthcare Providers:  IncredibleEmployment.be  This test is no t yet approved or cleared by the [place] FDA and  has been authorized for detection and/or diagnosis of SARS-CoV-2 by FDA under an Emergency Use Authorization (EUA). This EUA will remain  in effect (meaning this test can be used) for the duration of the COVID-19 declaration under Section 564(b)(1) of the Act, 21 U.S.C.section 360bbb-3(b)(1), unless the authorization is terminated  or revoked sooner.       Influenza A by PCR NEGATIVE NEGATIVE Final   Influenza B by PCR NEGATIVE NEGATIVE Final    Comment: (NOTE) The Xpert Xpress SARS-CoV-2/FLU/RSV plus assay is intended as an aid in the diagnosis of influenza from  Nasopharyngeal swab specimens and should not be used as a sole basis for treatment. Nasal washings and aspirates are unacceptable for Xpert Xpress SARS-CoV-2/FLU/RSV testing.  Fact Sheet for Patients: EntrepreneurPulse.com.au  Fact Sheet for Healthcare Providers: IncredibleEmployment.be  This test is not yet approved or cleared by the [place] FDA and has been authorized for detection and/or diagnosis of SARS-CoV-2 by FDA under an Emergency Use Authorization (EUA). This EUA will remain in effect (meaning this test can be used) for the duration of the COVID-19 declaration under Section 564(b)(1) of the Act, 21 U.S.C. section 360bbb-3(b)(1), unless the authorization is terminated  or revoked.  Performed at [facility], [place] [address], [place], [place] [address]      Radiology Studies: No results found.  Scheduled Meds: . clonazePAM  1 mg Oral q AM  . docusate sodium  100 mg Oral BID  . enoxaparin (LOVENOX) injection  40 mg Subcutaneous Q24H  . escitalopram  10 mg Oral QHS  . feeding supplement  237 mL Oral Q24H  . levothyroxine  88 mcg Oral Q0600  . multivitamin with minerals  1 tablet Oral Daily  . predniSONE  5 mg Oral Q breakfast  . tamsulosin  0.4 mg Oral QPC supper   Continuous Infusions: . sodium chloride 10 mL/hr at 01/17/20 1006     LOS: 1 day    Time spent: 25 mins    Tyhesha Dutson, MD [facility]   If 7PM-7AM, please contact night-coverage

## 2020-01-21 DIAGNOSIS — S32810A Multiple fractures of pelvis with stable disruption of pelvic ring, initial encounter for closed fracture: Secondary | ICD-10-CM

## 2020-01-21 MED ORDER — METHOCARBAMOL 500 MG PO TABS
500.0000 mg | ORAL_TABLET | Freq: Four times a day (QID) | ORAL | Status: DC | PRN
  Administered 2020-01-21 – 2020-01-22 (×4): 500 mg via ORAL
  Filled 2020-01-21 (×4): qty 1

## 2020-01-21 MED ORDER — OXYCODONE-ACETAMINOPHEN 5-325 MG PO TABS
1.0000 | ORAL_TABLET | ORAL | Status: DC | PRN
  Administered 2020-01-21 – 2020-01-22 (×7): 2 via ORAL
  Filled 2020-01-21 (×7): qty 2

## 2020-01-21 MED ORDER — OXYCODONE-ACETAMINOPHEN 5-325 MG PO TABS
1.0000 | ORAL_TABLET | Freq: Four times a day (QID) | ORAL | Status: DC | PRN
  Administered 2020-01-21: 2 via ORAL
  Filled 2020-01-21: qty 2

## 2020-01-21 NOTE — Plan of Care (Signed)
  Problem: Education: Goal: Knowledge of General Education information will improve Description Including pain rating scale, medication(s)/side effects and non-pharmacologic comfort measures Outcome: Progressing   

## 2020-01-21 NOTE — Progress Notes (Signed)
[facility]  PROGRESS NOTE  Maxwell Fox [id number]:[id number] DOB: 1945-08-14 DOA: 01/17/2020 PCP: [name], MD   Brief HPI:   74 year old male with history of chronic adrenal insufficiency, hypothyroidism, anxiety came to ED with complaints of left hip pain s/p fall.  Patient reports that he slipped on water on the floor inside the bathroom.  Denied head injury or loss of consciousness.  X-ray in the ED showed displaced left superior ramus fracture.  Orthopedics was consulted and recommended conservative management.    Subjective   Patient seen and examined, continues to have pain.  Currently on Dilaudid 0.5 mg IV every 4 hours as needed along with Vicodin.  He says that current pain management is not working for him.   Assessment/Plan:     1. Left superior ramus fracture-x-ray showed minimally displaced left superior ramus fracture.  Orthopedics was consulted and recommended conservative management.  Patient waiting to go to skilled nursing facility.  Pain not well controlled, will change to Percocet 5/325 1 to 2 tablets every 4 hours as needed.  Also add Robaxin 500 mg p.o. every 6 hours as needed for muscle spasm. 2. Chronic adrenal insufficiency-continue home dose of prednisone 5 mg daily. 3. Hypothyroidism-continue Synthroid 4. Normocytic anemia-iron studies WNL, no evidence of bleeding. 5. BPH-continue Flomax 6. Anxiety-continue clonazepam, Lexapro     COVID-19 Labs  No results for input(s): DDIMER, FERRITIN, LDH, CRP in the last 72 hours.  Lab Results  Component Value Date   [place] NEGATIVE 01/17/2020     Scheduled medications:   . clonazePAM  1 mg Oral q AM  . docusate sodium  100 mg Oral BID  . enoxaparin (LOVENOX) injection  40 mg Subcutaneous Q24H  . escitalopram  10 mg Oral QHS  . feeding supplement  237 mL Oral Q24H  . levothyroxine  88 mcg Oral Q0600  . multivitamin with minerals  1 tablet Oral Daily  . predniSONE  5 mg Oral Q breakfast  .  tamsulosin  0.4 mg Oral QPC supper         CBG: Recent Labs  Lab 01/17/20 0902  GLUCAP 77    SpO2: 93 %    CBC: Recent Labs  Lab 01/17/20 0902  WBC 8.4  NEUTROABS 6.2  HGB 12.3*  HCT 38.6*  MCV 91.9  PLT 118*    Basic Metabolic Panel: Recent Labs  Lab 01/17/20 0902  NA 143  K 4.1  CL 107  CO2 28  GLUCOSE 86  BUN 18  CREATININE 0.76  CALCIUM 9.3     Liver Function Tests: Recent Labs  Lab 01/17/20 0902  AST 28  ALT 20  ALKPHOS 45  BILITOT 0.7  PROT 5.5*  ALBUMIN 3.2*     Antibiotics: Anti-infectives (From admission, onward)   None       DVT prophylaxis: Lovenox  Code Status: Full code  Family Communication: Wife at bedside   Consultants:  Orthopedics  Procedures:      Objective   Vitals:   01/20/20 1458 01/20/20 2143 01/21/20 0449 01/21/20 1358  BP: 125/71 134/87 124/72 109/64  Pulse: (!) 59 73 63 (!) 58  Resp: 16 17 16 18   Temp: 98.1 F (36.7 C) 97.9 F (36.6 C) 98.3 F (36.8 C) 98 F (36.7 C)  TempSrc: Oral  Oral Oral  SpO2: 97% 100% 96% 93%  Weight:      Height:        Intake/Output Summary (Last 24 hours) at 01/21/2020 1657 Last data  filed at 01/21/2020 1206 Gross per 24 hour  Intake 600 ml  Output 1625 ml  Net -1025 ml    12/17 1901 - 12/19 0700 In: 780 [P.O.:780] Out: 2525 [Urine:2525]  Filed Weights   01/17/20 0855  Weight: 88 kg    Physical Examination:    General-appears in no acute distress  Heart-S1-S2, regular, no murmur auscultated  Lungs-clear to auscultation bilaterally, no wheezing or crackles auscultated  Abdomen-soft, nontender, no organomegaly  Extremities-no edema in the lower extremities  Neuro-alert, oriented x3, no focal deficit noted   Status is: Inpatient  Dispo: The patient is from: Home              Anticipated d/c is to: Skilled nursing facility              Anticipated d/c date is: 01/23/2020              Patient currently  medically stable for  discharge  Barrier to discharge-awaiting bed at skilled nursing facility.       Data Reviewed:   Recent Results (from the past 240 hour(s))  Resp Panel by RT-PCR (Flu A&B, Covid) Nasopharyngeal Swab     Status: None   Collection Time: 01/17/20  9:08 AM   Specimen: Nasopharyngeal Swab; Nasopharyngeal(NP) swabs in vial transport medium  Result Value Ref Range Status   SARS Coronavirus 2 by RT PCR NEGATIVE NEGATIVE Final    Comment: (NOTE) SARS-CoV-2 target nucleic acids are NOT DETECTED.  The SARS-CoV-2 RNA is generally detectable in upper respiratory specimens during the acute phase of infection. The lowest concentration of SARS-CoV-2 viral copies this assay can detect is 138 copies/mL. A negative result does not preclude SARS-Cov-2 infection and should not be used as the sole basis for treatment or other patient management decisions. A negative result may occur with  improper specimen collection/handling, submission of specimen other than nasopharyngeal swab, presence of viral mutation(s) within the areas targeted by this assay, and inadequate number of viral copies(<138 copies/mL). A negative result must be combined with clinical observations, patient history, and epidemiological information. The expected result is Negative.  Fact Sheet for Patients:  EntrepreneurPulse.com.au  Fact Sheet for Healthcare Providers:  IncredibleEmployment.be  This test is no t yet approved or cleared by the [place] FDA and  has been authorized for detection and/or diagnosis of SARS-CoV-2 by FDA under an Emergency Use Authorization (EUA). This EUA will remain  in effect (meaning this test can be used) for the duration of the COVID-19 declaration under Section 564(b)(1) of the Act, 21 U.S.C.section 360bbb-3(b)(1), unless the authorization is terminated  or revoked sooner.       Influenza A by PCR NEGATIVE NEGATIVE Final   Influenza B by PCR NEGATIVE  NEGATIVE Final    Comment: (NOTE) The Xpert Xpress SARS-CoV-2/FLU/RSV plus assay is intended as an aid in the diagnosis of influenza from Nasopharyngeal swab specimens and should not be used as a sole basis for treatment. Nasal washings and aspirates are unacceptable for Xpert Xpress SARS-CoV-2/FLU/RSV testing.  Fact Sheet for Patients: EntrepreneurPulse.com.au  Fact Sheet for Healthcare Providers: IncredibleEmployment.be  This test is not yet approved or cleared by the [place] FDA and has been authorized for detection and/or diagnosis of SARS-CoV-2 by FDA under an Emergency Use Authorization (EUA). This EUA will remain in effect (meaning this test can be used) for the duration of the COVID-19 declaration under Section 564(b)(1) of the Act, 21 U.S.C. section 360bbb-3(b)(1), unless the authorization is terminated  or revoked.  Performed at [facility], [place] [address] [place], [place] [address]         Oswald Hillock   [facility] If 7PM-7AM, please contact night-coverage at [email or web address], Office  [phone]   01/21/2020, 4:57 PM  LOS: 2 days

## 2020-01-22 DIAGNOSIS — Z7401 Bed confinement status: Secondary | ICD-10-CM | POA: Diagnosis not present

## 2020-01-22 DIAGNOSIS — N289 Disorder of kidney and ureter, unspecified: Secondary | ICD-10-CM | POA: Diagnosis not present

## 2020-01-22 DIAGNOSIS — Z85820 Personal history of malignant melanoma of skin: Secondary | ICD-10-CM | POA: Diagnosis not present

## 2020-01-22 DIAGNOSIS — R2681 Unsteadiness on feet: Secondary | ICD-10-CM | POA: Diagnosis not present

## 2020-01-22 DIAGNOSIS — M25552 Pain in left hip: Secondary | ICD-10-CM | POA: Diagnosis not present

## 2020-01-22 DIAGNOSIS — F29 Unspecified psychosis not due to a substance or known physiological condition: Secondary | ICD-10-CM | POA: Diagnosis not present

## 2020-01-22 DIAGNOSIS — R278 Other lack of coordination: Secondary | ICD-10-CM | POA: Diagnosis not present

## 2020-01-22 DIAGNOSIS — S32512D Fracture of superior rim of left pubis, subsequent encounter for fracture with routine healing: Secondary | ICD-10-CM | POA: Diagnosis not present

## 2020-01-22 DIAGNOSIS — S32592A Other specified fracture of left pubis, initial encounter for closed fracture: Secondary | ICD-10-CM | POA: Diagnosis not present

## 2020-01-22 DIAGNOSIS — R5381 Other malaise: Secondary | ICD-10-CM | POA: Diagnosis not present

## 2020-01-22 DIAGNOSIS — E230 Hypopituitarism: Secondary | ICD-10-CM | POA: Diagnosis not present

## 2020-01-22 DIAGNOSIS — S32810D Multiple fractures of pelvis with stable disruption of pelvic ring, subsequent encounter for fracture with routine healing: Secondary | ICD-10-CM | POA: Diagnosis not present

## 2020-01-22 DIAGNOSIS — F411 Generalized anxiety disorder: Secondary | ICD-10-CM | POA: Diagnosis not present

## 2020-01-22 DIAGNOSIS — R52 Pain, unspecified: Secondary | ICD-10-CM | POA: Diagnosis not present

## 2020-01-22 DIAGNOSIS — W19XXXD Unspecified fall, subsequent encounter: Secondary | ICD-10-CM | POA: Diagnosis not present

## 2020-01-22 DIAGNOSIS — E039 Hypothyroidism, unspecified: Secondary | ICD-10-CM | POA: Diagnosis not present

## 2020-01-22 DIAGNOSIS — N400 Enlarged prostate without lower urinary tract symptoms: Secondary | ICD-10-CM | POA: Diagnosis not present

## 2020-01-22 DIAGNOSIS — Z9181 History of falling: Secondary | ICD-10-CM | POA: Diagnosis not present

## 2020-01-22 DIAGNOSIS — D509 Iron deficiency anemia, unspecified: Secondary | ICD-10-CM | POA: Diagnosis not present

## 2020-01-22 DIAGNOSIS — E611 Iron deficiency: Secondary | ICD-10-CM | POA: Diagnosis not present

## 2020-01-22 DIAGNOSIS — F419 Anxiety disorder, unspecified: Secondary | ICD-10-CM | POA: Diagnosis not present

## 2020-01-22 DIAGNOSIS — D649 Anemia, unspecified: Secondary | ICD-10-CM | POA: Diagnosis not present

## 2020-01-22 DIAGNOSIS — M2550 Pain in unspecified joint: Secondary | ICD-10-CM | POA: Diagnosis not present

## 2020-01-22 DIAGNOSIS — S32810A Multiple fractures of pelvis with stable disruption of pelvic ring, initial encounter for closed fracture: Secondary | ICD-10-CM | POA: Diagnosis not present

## 2020-01-22 DIAGNOSIS — C799 Secondary malignant neoplasm of unspecified site: Secondary | ICD-10-CM | POA: Diagnosis not present

## 2020-01-22 DIAGNOSIS — S72002A Fracture of unspecified part of neck of left femur, initial encounter for closed fracture: Secondary | ICD-10-CM | POA: Diagnosis not present

## 2020-01-22 DIAGNOSIS — E538 Deficiency of other specified B group vitamins: Secondary | ICD-10-CM | POA: Diagnosis not present

## 2020-01-22 DIAGNOSIS — M6281 Muscle weakness (generalized): Secondary | ICD-10-CM | POA: Diagnosis not present

## 2020-01-22 LAB — RESP PANEL BY RT-PCR (FLU A&B, COVID) ARPGX2
Influenza A by PCR: NEGATIVE
Influenza B by PCR: NEGATIVE
SARS Coronavirus 2 by RT PCR: NEGATIVE

## 2020-01-22 MED ORDER — OXYCODONE-ACETAMINOPHEN 5-325 MG PO TABS: 1.0000 | ORAL_TABLET | ORAL | 0 refills | Status: DC | PRN

## 2020-01-22 MED ORDER — CLONAZEPAM 1 MG PO TABS: 1.0000 mg | ORAL_TABLET | Freq: Every day | ORAL | 0 refills | Status: DC | PRN

## 2020-01-22 MED ORDER — DOCUSATE SODIUM 100 MG PO CAPS: 100.0000 mg | ORAL_CAPSULE | Freq: Two times a day (BID) | ORAL | 0 refills | Status: DC

## 2020-01-22 NOTE — Care Management Important Message (Incomplete)
Important Message  Patient Details i Name: Maxwell Fox MRN: [id number] Date of Birth: February 09, 1945   Medicare Important Message Given:  Yes     Kerin Salen 01/22/2020, 2:34 PM

## 2020-01-22 NOTE — Progress Notes (Signed)
Physical Therapy Treatment Patient Details Name: Maxwell Fox MRN: [id number] DOB: 08/29/1945 Today's Date: 01/22/2020    History of Present Illness 74 y.o. male with medical history significant of chronic adrenal insufficiency, hypothyroidism, anxiety. Presenting with hip pain after fall. Imaging showed L superior ramus fracture.    PT Comments    Pt progressing slowly.  Limited amb distance due to pain and fatigue.  Pt will need ST Rehab at SNF prior to returning home.   Follow Up Recommendations  SNF     Equipment Recommendations       Recommendations for Other Services       Precautions / Restrictions      Mobility  Bed Mobility Overal bed mobility: Needs Assistance Bed Mobility: Supine to Sit     Supine to sit: Mod assist     General bed mobility comments: pt self advanced LLE towards edge of bed with gait belt looped around left foot, mod A to raise trunk and pivot hips to edge of bed  Requires increased time  Transfers Overall transfer level: Needs assistance Equipment used: Bilateral platform walker   Sit to Stand: From elevated surface;Mod assist         General transfer comment: +2 max A from recliner, +2 mod A from elevated bed, assist to shift weight anteriorly in standing with EVA RW  Ambulation/Gait Ambulation/Gait assistance: Min guard;Min assist Gait Distance (Feet): 44 Feet Assistive device: Bilateral platform walker Gait Pattern/deviations: Step-to pattern;Decreased step length - left;Decreased step length - right;Trunk flexed Gait velocity: decreased   General Gait Details: VCs for posture, min A to manage/steer RW  Heavy lean on EVA walker.   Stairs             Wheelchair Mobility    Modified Rankin (Stroke Patients Only)       Balance                                            Cognition Arousal/Alertness: Awake/alert Behavior During Therapy: WFL for tasks assessed/performed Overall Cognitive  Status: Within Functional Limits for tasks assessed                                 General Comments: AxO x 3 pleasant but not fully understanding extent of his Fx.  Repeats "why does it hurt so bad?"      Exercises  10 reps AP and knee presses    General Comments        Pertinent Vitals/Pain Pain Assessment: 0-10 Pain Score: 8  Pain Location: L pelvis with WBing/amb Pain Descriptors / Indicators: Sore;Guarding;Aching;Radiating;Throbbing Pain Intervention(s): Monitored during session;Premedicated before session;Repositioned    Home Living                      Prior Function            PT Goals (current goals can now be found in the care plan section) Progress towards PT goals: Progressing toward goals    Frequency    Min 3X/week      PT Plan Current plan remains appropriate    Co-evaluation              AM-PAC PT "6 Clicks" Mobility   Outcome Measure  Help needed turning from your back to your side while  in a flat bed without using bedrails?: A Lot Help needed moving from lying on your back to sitting on the side of a flat bed without using bedrails?: A Lot Help needed moving to and from a bed to a chair (including a wheelchair)?: A Lot Help needed standing up from a chair using your arms (e.g., wheelchair or bedside chair)?: A Lot Help needed to walk in hospital room?: A Lot Help needed climbing 3-5 steps with a railing? : Total 6 Click Score: 11    End of Session Equipment Utilized During Treatment: Gait belt Activity Tolerance: Patient limited by fatigue;Patient limited by pain Patient left: with call bell/phone within reach;with family/visitor present;with chair alarm set;in bed Nurse Communication: Mobility status PT Visit Diagnosis: Difficulty in walking, not elsewhere classified (R26.2);Pain Pain - Right/Left: Left Pain - part of body: Hip     Time: 0935-1000 PT Time Calculation (min) (ACUTE ONLY): 25 min  Charges:   $Gait Training: 8-22 mins $Therapeutic Activity: 8-22 mins                     Rica Koyanagi  PTA Acute  Rehabilitation Services Pager      [phone] Office      [phone];

## 2020-01-22 NOTE — Discharge Summary (Signed)
Physician Discharge Summary  Maxwell Fox QPR:[id number] DOB: 10-30-1945 DOA: 01/17/2020  PCP: [name], MD  Admit date: 01/17/2020 Discharge date: 01/22/2020  Time spent: 50* minutes  Recommendations for Outpatient Follow-up:  1. Follow-up Dr. Tamera Punt in 2 weeks 2. Weightbearing as tolerated   Discharge Diagnoses:  Active Problems:   Hip fracture [facility])   Discharge Condition: Stable  Diet recommendation: Regular diet  Filed Weights   01/17/20 0855  Weight: 88 kg    History of present illness:  74 year old male with history of chronic adrenal insufficiency, hypothyroidism, anxiety came to ED with complaints of left hip pain s/p fall.  Patient reports that he slipped on water on the floor inside the bathroom.  Denied head injury or loss of consciousness.  X-ray in the ED showed displaced left superior ramus fracture.  Orthopedics was consulted and recommended conservative management.  Hospital Course:  1. Left superior ramus fracture-x-ray showed minimally displaced left superior ramus fracture.  Orthopedics was consulted and recommended conservative management..  Pain is well controlled with  Percocet 5/325 1 to 2 tablets every 4 hours as needed.    Patient to follow-up with Dr. Tamera Punt in 2 weeks.  Weightbearing as tolerated. 2. Chronic adrenal insufficiency-continue home dose of prednisone 5 mg daily. 3. Hypothyroidism-continue Synthroid 4. Normocytic anemia-iron studies WNL, no evidence of bleeding. 5. BPH-continue Flomax 6. Anxiety-continue clonazepam, Lexapro   Procedures:  None  Consultations:  Orthopedics  Discharge Exam: Vitals:   01/22/20 0625 01/22/20 1312  BP: 106/67 119/64  Pulse: (!) 57 (!) 57  Resp: 17 20  Temp: 97.9 F (36.6 C) (!) 97.4 F (36.3 C)  SpO2: 97% 96%     Discharge Instructions   Discharge Instructions    Diet - low sodium heart healthy   Complete by: As directed    Discharge instructions   Complete by: As  directed    Weight bearing as tolerated   Increase activity slowly   Complete by: As directed      Allergies as of 01/22/2020   No Known Allergies     Medication List    STOP taking these medications   calcium gluconate 500 MG tablet     TAKE these medications   calcium carbonate 1250 (500 Ca) MG tablet Commonly known as: OS-CAL - dosed in mg of elemental calcium Take 1 tablet by mouth every evening.   clonazePAM 1 MG tablet Commonly known as: KLONOPIN Take 1 tablet (1 mg total) by mouth daily as needed. What changed:   how much to take  how to take this  when to take this  reasons to take this  additional instructions   docusate sodium 100 MG capsule Commonly known as: COLACE Take 1 capsule (100 mg total) by mouth 2 (two) times daily.   escitalopram 10 MG tablet Commonly known as: LEXAPRO TAKE 1 TABLET(10 MG) BY MOUTH DAILY What changed:   how much to take  how to take this  when to take this  additional instructions   levothyroxine 88 MCG tablet Commonly known as: SYNTHROID Take 1 tablet (88 mcg total) by mouth daily before breakfast.   Multivitamin Adults Tabs Take 1 tablet by mouth every evening. What changed: Another medication with the same name was removed. Continue taking this medication, and follow the directions you see here.   oxyCODONE-acetaminophen 5-325 MG tablet Commonly known as: PERCOCET/ROXICET Take 1-2 tablets by mouth every 4 (four) hours as needed for severe pain.   predniSONE 5 MG tablet  Commonly known as: DELTASONE Take 1 tablet (5 mg total) by mouth daily with breakfast.   tamsulosin 0.4 MG Caps capsule Commonly known as: FLOMAX TAKE 1 CAPSULE(0.4 MG) BY MOUTH DAILY AFTER SUPPER   Vitamin D 50 MCG (2000 UT) tablet Take 1 tablet (2,000 Units total) by mouth daily. What changed: Another medication with the same name was removed. Continue taking this medication, and follow the directions you see here.             Durable Medical Equipment  (From admission, onward)         Start     Ordered   01/18/20 1523  For home use only DME 3 n 1  Once        01/18/20 1524   01/18/20 1523  For home use only DME Walker rolling  Once       Question Answer Comment  Walker: With [place] Wheels   Patient needs a walker to treat with the following condition Pelvic fracture ([place])      01/18/20 1524         No Known Allergies  Contact information for follow-up providers    Tania Ade, MD. Schedule an appointment as soon as possible for a visit in 2 week(s).   Specialty: Orthopedic Surgery Contact information: [place] [address]  [address] [phone]            Contact information for after-discharge care    Destination    [facility] Preferred SNF .   Service: Skilled Nursing Contact information: [place] [phone]                   The results of significant diagnostics from this hospitalization (including imaging, microbiology, ancillary and laboratory) are listed below for reference.    Significant Diagnostic Studies: DG Chest 2 View  Result Date: 01/17/2020 CLINICAL DATA:  S/p fall in bathroom after slipping in water this am. Patient was able to crawl to bed and get up into bed. L hip pain. -shortening/rotation EXAM: CHEST - 2 VIEW COMPARISON:  08/16/2018 FINDINGS: Lungs are clear. Heart size and mediastinal contours are within normal limits. Aortic Atherosclerosis (ICD10-170.0). No effusion. Right shoulder arthroplasty. IMPRESSION: No acute cardiopulmonary disease. Electronically Signed   By: Lucrezia Europe M.D.   On: 01/17/2020 09:41   DG Hip Unilat W or Wo Pelvis 2-3 Views Left  Result Date: 01/17/2020 CLINICAL DATA:  Pain post fall today EXAM: DG HIP (WITH OR WITHOUT PELVIS) 2-3V LEFT COMPARISON:  None. FINDINGS: Left pubic superior ramus fracture, minimally displaced. Femoral neck  intact. No dislocation. Spondylitic changes in the lower lumbar spine. IMPRESSION: Minimally displaced left superior ramus fracture. Electronically Signed   By: Lucrezia Europe M.D.   On: 01/17/2020 09:40    Microbiology: Recent Results (from the past 240 hour(s))  Resp Panel by RT-PCR (Flu A&B, Covid) Nasopharyngeal Swab     Status: None   Collection Time: 01/17/20  9:08 AM   Specimen: Nasopharyngeal Swab; Nasopharyngeal(NP) swabs in vial transport medium  Result Value Ref Range Status   SARS Coronavirus 2 by RT PCR NEGATIVE NEGATIVE Final    Comment: (NOTE) SARS-CoV-2 target nucleic acids are NOT DETECTED.  The SARS-CoV-2 RNA is generally detectable in upper respiratory specimens during the acute phase of infection. The lowest concentration of SARS-CoV-2 viral copies this assay can detect is 138 copies/mL. A negative result does not preclude SARS-Cov-2 infection and should  not be used as the sole basis for treatment or other patient management decisions. A negative result may occur with  improper specimen collection/handling, submission of specimen other than nasopharyngeal swab, presence of viral mutation(s) within the areas targeted by this assay, and inadequate number of viral copies(<138 copies/mL). A negative result must be combined with clinical observations, patient history, and epidemiological information. The expected result is Negative.  Fact Sheet for Patients:  EntrepreneurPulse.com.au  Fact Sheet for Healthcare Providers:  IncredibleEmployment.be  This test is no t yet approved or cleared by the [place] FDA and  has been authorized for detection and/or diagnosis of SARS-CoV-2 by FDA under an Emergency Use Authorization (EUA). This EUA will remain  in effect (meaning this test can be used) for the duration of the COVID-19 declaration under Section 564(b)(1) of the Act, 21 U.S.C.section 360bbb-3(b)(1), unless the authorization is  terminated  or revoked sooner.       Influenza A by PCR NEGATIVE NEGATIVE Final   Influenza B by PCR NEGATIVE NEGATIVE Final    Comment: (NOTE) The Xpert Xpress SARS-CoV-2/FLU/RSV plus assay is intended as an aid in the diagnosis of influenza from Nasopharyngeal swab specimens and should not be used as a sole basis for treatment. Nasal washings and aspirates are unacceptable for Xpert Xpress SARS-CoV-2/FLU/RSV testing.  Fact Sheet for Patients: EntrepreneurPulse.com.au  Fact Sheet for Healthcare Providers: IncredibleEmployment.be  This test is not yet approved or cleared by the [place] FDA and has been authorized for detection and/or diagnosis of SARS-CoV-2 by FDA under an Emergency Use Authorization (EUA). This EUA will remain in effect (meaning this test can be used) for the duration of the COVID-19 declaration under Section 564(b)(1) of the Act, 21 U.S.C. section 360bbb-3(b)(1), unless the authorization is terminated or revoked.  Performed at [facility], [place] [address] [place], [place] [address]   Resp Panel by RT-PCR (Flu A&B, Covid) Nasopharyngeal Swab     Status: None   Collection Time: 01/22/20 10:39 AM   Specimen: Nasopharyngeal Swab; Nasopharyngeal(NP) swabs in vial transport medium  Result Value Ref Range Status   SARS Coronavirus 2 by RT PCR NEGATIVE NEGATIVE Final    Comment: (NOTE) SARS-CoV-2 target nucleic acids are NOT DETECTED.  The SARS-CoV-2 RNA is generally detectable in upper respiratory specimens during the acute phase of infection. The lowest concentration of SARS-CoV-2 viral copies this assay can detect is 138 copies/mL. A negative result does not preclude SARS-Cov-2 infection and should not be used as the sole basis for treatment or other patient management decisions. A negative result may occur with  improper specimen collection/handling, submission of specimen other than  nasopharyngeal swab, presence of viral mutation(s) within the areas targeted by this assay, and inadequate number of viral copies(<138 copies/mL). A negative result must be combined with clinical observations, patient history, and epidemiological information. The expected result is Negative.  Fact Sheet for Patients:  EntrepreneurPulse.com.au  Fact Sheet for Healthcare Providers:  IncredibleEmployment.be  This test is no t yet approved or cleared by the [place] FDA and  has been authorized for detection and/or diagnosis of SARS-CoV-2 by FDA under an Emergency Use Authorization (EUA). This EUA will remain  in effect (meaning this test can be used) for the duration of the COVID-19 declaration under Section 564(b)(1) of the Act, 21 U.S.C.section 360bbb-3(b)(1), unless the authorization is terminated  or revoked sooner.       Influenza A by PCR NEGATIVE NEGATIVE Final   Influenza B by PCR  NEGATIVE NEGATIVE Final    Comment: (NOTE) The Xpert Xpress SARS-CoV-2/FLU/RSV plus assay is intended as an aid in the diagnosis of influenza from Nasopharyngeal swab specimens and should not be used as a sole basis for treatment. Nasal washings and aspirates are unacceptable for Xpert Xpress SARS-CoV-2/FLU/RSV testing.  Fact Sheet for Patients: EntrepreneurPulse.com.au  Fact Sheet for Healthcare Providers: IncredibleEmployment.be  This test is not yet approved or cleared by the [place] FDA and has been authorized for detection and/or diagnosis of SARS-CoV-2 by FDA under an Emergency Use Authorization (EUA). This EUA will remain in effect (meaning this test can be used) for the duration of the COVID-19 declaration under Section 564(b)(1) of the Act, 21 U.S.C. section 360bbb-3(b)(1), unless the authorization is terminated or revoked.  Performed at [facility], [place] [address] [place], [place] [address]      Labs: Basic Metabolic Panel: Recent Labs  Lab 01/17/20 0902  NA 143  K 4.1  CL 107  CO2 28  GLUCOSE 86  BUN 18  CREATININE 0.76  CALCIUM 9.3   Liver Function Tests: Recent Labs  Lab 01/17/20 0902  AST 28  ALT 20  ALKPHOS 45  BILITOT 0.7  PROT 5.5*  ALBUMIN 3.2*   CBC: Recent Labs  Lab 01/17/20 0902  WBC 8.4  NEUTROABS 6.2  HGB 12.3*  HCT 38.6*  MCV 91.9  PLT 118*    CBG: Recent Labs  Lab 01/17/20 0902  GLUCAP 77       Signed:  Oswald Hillock MD.  [facility] 01/22/2020, 2:03 PM

## 2020-01-22 NOTE — TOC Progression Note (Signed)
Transition of Care [place] Day) - Progression Note    Patient Details  Name: Maxwell Fox MRN: [id number] Date of Birth: 14-Feb-1945  Transition of Care [facility]) CM/SW Rachel, LCSW Phone Number: 01/22/2020, 12:00 PM  Clinical Narrative:    CSW provided the patient and his daughter with list of SNF bed offers. The patient and daughter aware the patient is medically ready to transfer today. Daughter has requested to look at two of the SNF's before making a decision.  Daughter to call CSW when a decision has been made.   Expected Discharge Plan: [place] Barriers to Discharge: SNF Pending bed offer  Expected Discharge Plan and Services Expected Discharge Plan: [place] In-house Referral: Clinical Social Work Discharge Planning Services: CM Consult Post Acute Care Choice: [place] arrangements for the past 2 months: [place]                       Time DME Agency Contacted: 1528               Social Determinants of Health (SDOH) Interventions    Readmission Risk Interventions No flowsheet data found.

## 2020-01-22 NOTE — Plan of Care (Signed)
Patient discharged all careplans met  

## 2020-01-22 NOTE — Progress Notes (Signed)
AVS paperwork and paper scripts placed in envelope and sent with patient to [facility]

## 2020-01-22 NOTE — Plan of Care (Signed)
  Problem: Education: Goal: Knowledge of General Education information will improve Description: Including pain rating scale, medication(s)/side effects and non-pharmacologic comfort measures Outcome: Progressing   Problem: Activity: Goal: Risk for activity intolerance will decrease Outcome: Progressing   Problem: Nutrition: Goal: Adequate nutrition will be maintained Outcome: Progressing   

## 2020-01-22 NOTE — TOC Transition Note (Signed)
Transition of Care [facility]) - CM/SW Discharge Note   Patient Details  Name: Pastor Sgro MRN: [id number] Date of Birth: [birth year]  Transition of [facility]) CM/SW Contact:  Lia Hopping, [place] Phone Number: 01/22/2020, 1:38 PM   Clinical Narrative:    Patient and daughter agreeable to [facility] for short rehab Covid test resulted negative PTAR to transport Nurse call report to: [phone] Room 211  Final next level of care: Skilled Nursing Facility Barriers to Discharge: Barriers Resolved   Patient Goals and CMS Choice Patient states their goals for this hospitalization and ongoing recovery are:: to return home with family and home health PT/OT CMS Medicare.gov Compare Post Acute Care list provided to:: Patient Choice offered to / list presented to : [place]  Discharge Placement PASRR number recieved: 01/19/20            Patient chooses bed at: [place] [place] Patient to be transferred to facility by: [place] Name of family member notified: Daughter [name] Patient and family notified of of transfer: 01/22/20  Discharge Plan and Services In-house Referral: Clinical Social Work Discharge Planning Services: CM Consult Post Acute Care Choice: [place]                [employer] Agency Contacted: 1528              Social Determinants of Health (SDOH) Interventions     Readmission Risk Interventions No flowsheet data found.

## 2020-01-22 NOTE — Progress Notes (Signed)
[facility]  PROGRESS NOTE  Shalik Sanfilippo YQM:[id number] DOB: Jun 02, 1945 DOA: 01/17/2020 PCP: [name], MD   Brief HPI:   74 year old male with history of chronic adrenal insufficiency, hypothyroidism, anxiety came to ED with complaints of left hip pain s/p fall.  Patient reports that he slipped on water on the floor inside the bathroom.  Denied head injury or loss of consciousness.  X-ray in the ED showed displaced left superior ramus fracture.  Orthopedics was consulted and recommended conservative management.    Subjective   Patient seen and examined, pain is better controlled after starting Percocet.   Assessment/Plan:     1. Left superior ramus fracture-x-ray showed minimally displaced left superior ramus fracture.  Orthopedics was consulted and recommended conservative management.  Patient waiting to go to skilled nursing facility.  Pain is well controlled with  Percocet 5/325 1 to 2 tablets every 4 hours as needed.  Continue Robaxin 500 mg p.o. every 6 hours as needed for muscle spasm. 2. Chronic adrenal insufficiency-continue home dose of prednisone 5 mg daily. 3. Hypothyroidism-continue Synthroid 4. Normocytic anemia-iron studies WNL, no evidence of bleeding. 5. BPH-continue Flomax 6. Anxiety-continue clonazepam, Lexapro     COVID-19 Labs  No results for input(s): DDIMER, FERRITIN, LDH, CRP in the last 72 hours.  Lab Results  Component Value Date   SARSCOV2NAA NEGATIVE 01/22/2020   [place] NEGATIVE 01/17/2020     Scheduled medications:   . clonazePAM  1 mg Oral q AM  . docusate sodium  100 mg Oral BID  . enoxaparin (LOVENOX) injection  40 mg Subcutaneous Q24H  . escitalopram  10 mg Oral QHS  . feeding supplement  237 mL Oral Q24H  . levothyroxine  88 mcg Oral Q0600  . multivitamin with minerals  1 tablet Oral Daily  . predniSONE  5 mg Oral Q breakfast  . tamsulosin  0.4 mg Oral QPC supper         CBG: Recent Labs  Lab 01/17/20 0902   GLUCAP 77    SpO2: 97 %    CBC: Recent Labs  Lab 01/17/20 0902  WBC 8.4  NEUTROABS 6.2  HGB 12.3*  HCT 38.6*  MCV 91.9  PLT 118*    Basic Metabolic Panel: Recent Labs  Lab 01/17/20 0902  NA 143  K 4.1  CL 107  CO2 28  GLUCOSE 86  BUN 18  CREATININE 0.76  CALCIUM 9.3     Liver Function Tests: Recent Labs  Lab 01/17/20 0902  AST 28  ALT 20  ALKPHOS 45  BILITOT 0.7  PROT 5.5*  ALBUMIN 3.2*     Antibiotics: Anti-infectives (From admission, onward)   None       DVT prophylaxis: Lovenox  Code Status: Full code  Family Communication: Wife at bedside   Consultants:  Orthopedics  Procedures:      Objective   Vitals:   01/21/20 0449 01/21/20 1358 01/21/20 2114 01/22/20 0625  BP: 124/72 109/64 113/62 106/67  Pulse: 63 (!) 58 61 (!) 57  Resp: 16 18 18 17   Temp: 98.3 F (36.8 C) 98 F (36.7 C) 98.2 F (36.8 C) 97.9 F (36.6 C)  TempSrc: Oral Oral Oral Oral  SpO2: 96% 93% 96% 97%  Weight:      Height:        Intake/Output Summary (Last 24 hours) at 01/22/2020 1142 Last data filed at 01/22/2020 0947 Gross per 24 hour  Intake 1600 ml  Output 1500 ml  Net 100 ml  12/18 1901 - 12/20 0700 In: 1720 [P.O.:960; I.V.:760] Out: 2525 [Urine:2525]  Filed Weights   01/17/20 0855  Weight: 88 kg    Physical Examination:    General-appears in no acute distress  Heart-S1-S2, regular, no murmur auscultated  Lungs-clear to auscultation bilaterally, no wheezing or crackles auscultated  Abdomen-soft, nontender, no organomegaly  Extremities-no edema in the lower extremities  Neuro-alert, oriented x3, no focal deficit noted   Status is: Inpatient  Dispo: The patient is from: Home              Anticipated d/c is to: Skilled nursing facility              Anticipated d/c date is: 01/23/2020              Patient currently  medically stable for discharge  Barrier to discharge-awaiting bed at skilled nursing facility.        Data Reviewed:   Recent Results (from the past 240 hour(s))  Resp Panel by RT-PCR (Flu A&B, Covid) Nasopharyngeal Swab     Status: None   Collection Time: 01/17/20  9:08 AM   Specimen: Nasopharyngeal Swab; Nasopharyngeal(NP) swabs in vial transport medium  Result Value Ref Range Status   SARS Coronavirus 2 by RT PCR NEGATIVE NEGATIVE Final    Comment: (NOTE) SARS-CoV-2 target nucleic acids are NOT DETECTED.  The SARS-CoV-2 RNA is generally detectable in upper respiratory specimens during the acute phase of infection. The lowest concentration of SARS-CoV-2 viral copies this assay can detect is 138 copies/mL. A negative result does not preclude SARS-Cov-2 infection and should not be used as the sole basis for treatment or other patient management decisions. A negative result may occur with  improper specimen collection/handling, submission of specimen other than nasopharyngeal swab, presence of viral mutation(s) within the areas targeted by this assay, and inadequate number of viral copies(<138 copies/mL). A negative result must be combined with clinical observations, patient history, and epidemiological information. The expected result is Negative.  Fact Sheet for Patients:  EntrepreneurPulse.com.au  Fact Sheet for Healthcare Providers:  IncredibleEmployment.be  This test is no t yet approved or cleared by the [place] FDA and  has been authorized for detection and/or diagnosis of SARS-CoV-2 by FDA under an Emergency Use Authorization (EUA). This EUA will remain  in effect (meaning this test can be used) for the duration of the COVID-19 declaration under Section 564(b)(1) of the Act, 21 U.S.C.section 360bbb-3(b)(1), unless the authorization is terminated  or revoked sooner.       Influenza A by PCR NEGATIVE NEGATIVE Final   Influenza B by PCR NEGATIVE NEGATIVE Final    Comment: (NOTE) The Xpert Xpress SARS-CoV-2/FLU/RSV plus  assay is intended as an aid in the diagnosis of influenza from Nasopharyngeal swab specimens and should not be used as a sole basis for treatment. Nasal washings and aspirates are unacceptable for Xpert Xpress SARS-CoV-2/FLU/RSV testing.  Fact Sheet for Patients: EntrepreneurPulse.com.au  Fact Sheet for Healthcare Providers: IncredibleEmployment.be  This test is not yet approved or cleared by the [place] FDA and has been authorized for detection and/or diagnosis of SARS-CoV-2 by FDA under an Emergency Use Authorization (EUA). This EUA will remain in effect (meaning this test can be used) for the duration of the COVID-19 declaration under Section 564(b)(1) of the Act, 21 U.S.C. section 360bbb-3(b)(1), unless the authorization is terminated or revoked.  Performed at [facility], [place] [address] [place], [place] [address]   Resp Panel by RT-PCR (  Flu A&B, Covid) Nasopharyngeal Swab     Status: None   Collection Time: 01/22/20 10:39 AM   Specimen: Nasopharyngeal Swab; Nasopharyngeal(NP) swabs in vial transport medium  Result Value Ref Range Status   SARS Coronavirus 2 by RT PCR NEGATIVE NEGATIVE Final    Comment: (NOTE) SARS-CoV-2 target nucleic acids are NOT DETECTED.  The SARS-CoV-2 RNA is generally detectable in upper respiratory specimens during the acute phase of infection. The lowest concentration of SARS-CoV-2 viral copies this assay can detect is 138 copies/mL. A negative result does not preclude SARS-Cov-2 infection and should not be used as the sole basis for treatment or other patient management decisions. A negative result may occur with  improper specimen collection/handling, submission of specimen other than nasopharyngeal swab, presence of viral mutation(s) within the areas targeted by this assay, and inadequate number of viral copies(<138 copies/mL). A negative result must be combined with clinical  observations, patient history, and epidemiological information. The expected result is Negative.  Fact Sheet for Patients:  EntrepreneurPulse.com.au  Fact Sheet for Healthcare Providers:  IncredibleEmployment.be  This test is no t yet approved or cleared by the [place] FDA and  has been authorized for detection and/or diagnosis of SARS-CoV-2 by FDA under an Emergency Use Authorization (EUA). This EUA will remain  in effect (meaning this test can be used) for the duration of the COVID-19 declaration under Section 564(b)(1) of the Act, 21 U.S.C.section 360bbb-3(b)(1), unless the authorization is terminated  or revoked sooner.       Influenza A by PCR NEGATIVE NEGATIVE Final   Influenza B by PCR NEGATIVE NEGATIVE Final    Comment: (NOTE) The Xpert Xpress SARS-CoV-2/FLU/RSV plus assay is intended as an aid in the diagnosis of influenza from Nasopharyngeal swab specimens and should not be used as a sole basis for treatment. Nasal washings and aspirates are unacceptable for Xpert Xpress SARS-CoV-2/FLU/RSV testing.  Fact Sheet for Patients: EntrepreneurPulse.com.au  Fact Sheet for Healthcare Providers: IncredibleEmployment.be  This test is not yet approved or cleared by the [place] FDA and has been authorized for detection and/or diagnosis of SARS-CoV-2 by FDA under an Emergency Use Authorization (EUA). This EUA will remain in effect (meaning this test can be used) for the duration of the COVID-19 declaration under Section 564(b)(1) of the Act, 21 U.S.C. section 360bbb-3(b)(1), unless the authorization is terminated or revoked.  Performed at [facility], [place] [address], [place], [place] [address]         Oswald Hillock   [facility] If 7PM-7AM, please contact night-coverage at [email or web address], Office  [phone]   01/22/2020, 11:42 AM  LOS: 3 days

## 2020-01-26 DIAGNOSIS — F411 Generalized anxiety disorder: Secondary | ICD-10-CM | POA: Diagnosis not present

## 2020-01-26 DIAGNOSIS — D509 Iron deficiency anemia, unspecified: Secondary | ICD-10-CM | POA: Diagnosis not present

## 2020-01-26 DIAGNOSIS — E230 Hypopituitarism: Secondary | ICD-10-CM | POA: Diagnosis not present

## 2020-01-26 DIAGNOSIS — E039 Hypothyroidism, unspecified: Secondary | ICD-10-CM | POA: Diagnosis not present

## 2020-01-26 DIAGNOSIS — E538 Deficiency of other specified B group vitamins: Secondary | ICD-10-CM | POA: Diagnosis not present

## 2020-01-26 DIAGNOSIS — Z85820 Personal history of malignant melanoma of skin: Secondary | ICD-10-CM | POA: Diagnosis not present

## 2020-01-26 DIAGNOSIS — N400 Enlarged prostate without lower urinary tract symptoms: Secondary | ICD-10-CM | POA: Diagnosis not present

## 2020-01-26 DIAGNOSIS — S72002A Fracture of unspecified part of neck of left femur, initial encounter for closed fracture: Secondary | ICD-10-CM | POA: Diagnosis not present

## 2020-01-31 DIAGNOSIS — S32592A Other specified fracture of left pubis, initial encounter for closed fracture: Secondary | ICD-10-CM | POA: Diagnosis not present

## 2020-01-31 DIAGNOSIS — M25552 Pain in left hip: Secondary | ICD-10-CM | POA: Diagnosis not present

## 2020-02-02 DIAGNOSIS — D649 Anemia, unspecified: Secondary | ICD-10-CM | POA: Diagnosis not present

## 2020-02-02 DIAGNOSIS — E611 Iron deficiency: Secondary | ICD-10-CM | POA: Diagnosis not present

## 2020-02-02 DIAGNOSIS — Z9884 Bariatric surgery status: Secondary | ICD-10-CM | POA: Diagnosis not present

## 2020-02-02 DIAGNOSIS — Z87891 Personal history of nicotine dependence: Secondary | ICD-10-CM | POA: Diagnosis not present

## 2020-02-02 DIAGNOSIS — E538 Deficiency of other specified B group vitamins: Secondary | ICD-10-CM | POA: Diagnosis not present

## 2020-02-02 DIAGNOSIS — Z96619 Presence of unspecified artificial shoulder joint: Secondary | ICD-10-CM | POA: Diagnosis not present

## 2020-02-02 DIAGNOSIS — E230 Hypopituitarism: Secondary | ICD-10-CM | POA: Diagnosis not present

## 2020-02-02 DIAGNOSIS — S32810D Multiple fractures of pelvis with stable disruption of pelvic ring, subsequent encounter for fracture with routine healing: Secondary | ICD-10-CM | POA: Diagnosis not present

## 2020-02-02 DIAGNOSIS — Z85820 Personal history of malignant melanoma of skin: Secondary | ICD-10-CM | POA: Diagnosis not present

## 2020-02-02 DIAGNOSIS — S3289XA Fracture of other parts of pelvis, initial encounter for closed fracture: Secondary | ICD-10-CM | POA: Diagnosis not present

## 2020-02-02 DIAGNOSIS — N289 Disorder of kidney and ureter, unspecified: Secondary | ICD-10-CM | POA: Diagnosis not present

## 2020-02-02 DIAGNOSIS — E2740 Unspecified adrenocortical insufficiency: Secondary | ICD-10-CM | POA: Diagnosis not present

## 2020-02-02 DIAGNOSIS — W19XXXD Unspecified fall, subsequent encounter: Secondary | ICD-10-CM | POA: Diagnosis not present

## 2020-02-02 DIAGNOSIS — E039 Hypothyroidism, unspecified: Secondary | ICD-10-CM | POA: Diagnosis not present

## 2020-02-02 DIAGNOSIS — M47816 Spondylosis without myelopathy or radiculopathy, lumbar region: Secondary | ICD-10-CM | POA: Diagnosis not present

## 2020-02-02 DIAGNOSIS — Z9181 History of falling: Secondary | ICD-10-CM | POA: Diagnosis not present

## 2020-02-02 DIAGNOSIS — N400 Enlarged prostate without lower urinary tract symptoms: Secondary | ICD-10-CM | POA: Diagnosis not present

## 2020-02-02 DIAGNOSIS — S72002D Fracture of unspecified part of neck of left femur, subsequent encounter for closed fracture with routine healing: Secondary | ICD-10-CM | POA: Diagnosis not present

## 2020-02-02 DIAGNOSIS — F419 Anxiety disorder, unspecified: Secondary | ICD-10-CM | POA: Diagnosis not present

## 2020-02-03 DIAGNOSIS — Z5189 Encounter for other specified aftercare: Secondary | ICD-10-CM

## 2020-02-03 DIAGNOSIS — K922 Gastrointestinal hemorrhage, unspecified: Secondary | ICD-10-CM

## 2020-02-03 HISTORY — DX: Encounter for other specified aftercare: Z51.89

## 2020-02-03 HISTORY — DX: Gastrointestinal hemorrhage, unspecified: K92.2

## 2020-02-06 ENCOUNTER — Other Ambulatory Visit: Payer: Self-pay | Admitting: *Deleted

## 2020-02-06 ENCOUNTER — Telehealth: Payer: Self-pay | Admitting: Internal Medicine

## 2020-02-06 NOTE — Telephone Encounter (Signed)
Unfortunately, he needs to contact orthopedics to find out when they want him to do PT

## 2020-02-06 NOTE — Patient Outreach (Signed)
[facility]) Care Management  02/06/2020  Maxwell Fox 10-Oct-1945 [id number]  Initial telephone outreach to follow up on [facility] SN[facility] discharge, pt reporting he has questions. The call was unsuccessful, left message and requested a return call.  Pt was hospitalized post fall and had a femur fx. Conservative tx ordered and pt went to rehab for a short stay and is now home.  Zara Council. Burgess Estelle, MSN, [facility] Gerontological Nurse Practitioner [facility] Care Management [phone]

## 2020-02-06 NOTE — Telephone Encounter (Signed)
Rob a physical therapist from [employer], states he was handed this patient today and it was supposed to be the first visit today but when he spoke to the patient the patient said his doctor wanted him not to do PT and to stay off of the hip. Rob wanted to confirm that this information was correct. [phone]

## 2020-02-07 NOTE — Telephone Encounter (Signed)
Message left for Rob to contact Ortho for follow up.  From note looks like patient was to see Dr. Jones Broom with [facility].

## 2020-02-08 NOTE — Progress Notes (Signed)
Subjective:    Patient ID: Maxwell Fox, male    DOB: [birth year], 75 y.o.   MRN: [id number]  HPI The patient is here for follow up from the hospital/rehab.    Hospital admission 12/15-12/20.  Discharged to [facility].  He is here with his daughter.  He went to the ED after falling and having hip pain.  He was going to the bathroom and he slipped on some water.  He fell on his stomach.  He had no head injury or LOC.  He crawled back to bed and called EMS.  Xray in ED showed minimally displaced left superior ramus fx.  Ortho was consulted and they recommended conservative management.  Pain controlled with percocet.  Weightbearing as tolerated.  Had PT/OT.  Following with Dr Charlann Boxer .  Chronic medical problems were stable - home meds continued.    Not doing PT - healing.  Orthopedics does not want him to do physical therapy at this time.  He is taking the pain medication, which is prescribed by orthopedics.  He has no concerns and not sure why he needed to come.  Overall he is taking his medications daily as prescribed.  He states he is sleeping well, eating well.  He feels his depression and anxiety are controlled.   Medications and allergies reviewed with patient and updated if appropriate.  Patient Active Problem List   Diagnosis Date Noted  . Hip fracture (HCC) 01/17/2020  . Anxiety and depression 01/10/2018  . History of antineoplastic chemotherapy 07/19/2017  . Chronic midline low back pain without sciatica 04/19/2017  . Metastatic melanoma (HCC) 01/07/2017  . Adrenal insufficiency due to cancer therapy (HCC) 07/27/2016  . Secondary hypothyroidism 07/27/2016  . H/O gastric bypass 01/06/2016  . Pancreatic cyst 01/06/2016  . Metastatic melanoma of brain 12/31/2015  . Iron deficiency anemia   . B12 nutritional deficiency   . Hypertension   . Benign prostatic hyperplasia     Current Outpatient Medications on File Prior to Visit  Medication Sig Dispense Refill  . calcium  carbonate (OS-CAL - DOSED IN MG OF ELEMENTAL CALCIUM) 1250 (500 Ca) MG tablet Take 1 tablet by mouth every evening.    . Cholecalciferol (VITAMIN D) 2000 UNITS tablet Take 1 tablet (2,000 Units total) by mouth daily. 30 tablet 11  . clonazePAM (KLONOPIN) 1 MG tablet Take 1 tablet (1 mg total) by mouth daily as needed. 5 tablet 0  . docusate sodium (COLACE) 100 MG capsule Take 1 capsule (100 mg total) by mouth 2 (two) times daily. 10 capsule 0  . escitalopram (LEXAPRO) 10 MG tablet TAKE 1 TABLET(10 MG) BY MOUTH DAILY (Patient taking differently: Take 10 mg by mouth at bedtime.) 90 tablet 0  . HYDROcodone-acetaminophen (NORCO/VICODIN) 5-325 MG tablet Take 1 tablet by mouth every 4 (four) hours as needed.    Marland Kitchen levothyroxine (SYNTHROID) 88 MCG tablet Take 1 tablet (88 mcg total) by mouth daily before breakfast. 90 tablet 3  . Multiple Vitamins-Minerals (MULTIVITAMIN ADULTS) TABS Take 1 tablet by mouth every evening.    Marland Kitchen oxyCODONE-acetaminophen (PERCOCET/ROXICET) 5-325 MG tablet Take 1-2 tablets by mouth every 4 (four) hours as needed for severe pain. 10 tablet 0  . predniSONE (DELTASONE) 5 MG tablet Take 1 tablet (5 mg total) by mouth daily with breakfast. 120 tablet 3  . tamsulosin (FLOMAX) 0.4 MG CAPS capsule TAKE 1 CAPSULE(0.4 MG) BY MOUTH DAILY AFTER SUPPER 90 capsule 1   No current facility-administered medications on file prior to  visit.    Past Medical History:  Diagnosis Date  . B12 nutritional deficiency    malabsorbtion  . BPH (benign prostatic hypertrophy)   . Diverticulosis   . Hypertension   . Iron deficiency anemia    malabsorption related (s/p gastric bypass)  . Melanoma ([place])   . Obesity    s/p gastric bypass 2000, start 355#  . Pancreatic cyst   . Pulmonary nodules     Past Surgical History:  Procedure Laterality Date  . CRANIOTOMY FOR TUMOR  12/11/2015   metastatic melanoma  . MOHS SURGERY  2012   melanoma, mid back, [place]  . ROUX-EN-Y PROCEDURE  2000  .  TOTAL SHOULDER REPLACEMENT Right 2011   [place]    Social History   Socioeconomic History  . Marital status: Married    Spouse name: Not on file  . Number of children: 2  . Years of education: Not on file  . Highest education level: Not on file  Occupational History  . Not on file  Tobacco Use  . Smoking status: Former Smoker    Quit date: 02/03/1963    Years since quitting: 57.0  . Smokeless tobacco: Never Used  Substance and Sexual Activity  . Alcohol use: Not on file    Comment: wine 2x/week  . Drug use: Not on file  . Sexual activity: Not on file  Other Topics Concern  . 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 Hypertension Father   . Macular degeneration Mother   . Melanoma Mother   . Melanoma Sister   . Breast cancer Sister   . Uterine cancer Sister   . Stomach cancer Sister   . Melanoma Brother     Review of Systems  Constitutional: Negative for chills and fever.  Respiratory: Negative for cough, shortness of breath and wheezing.   Cardiovascular: Negative for chest pain, palpitations and leg swelling.  Neurological: Negative for light-headedness and headaches.       Objective:   Vitals:   02/09/20 1029  BP: 116/70  Pulse: 80  Temp: 98 F (36.7 C)  SpO2: 98%   BP Readings from Last 3 Encounters:  02/09/20 116/70  01/22/20 119/64  08/18/19 120/60   Wt Readings from Last 3 Encounters:  01/17/20 194 lb 0.1 oz (88 kg)  08/18/19 196 lb (88.9 kg)  04/07/19 188 lb (85.3 kg)   Body mass index is 27.84 kg/m.   Physical Exam    Constitutional: Appears well-developed and  well-nourished. No distress.  HENT:  Head: Normocephalic and atraumatic.  Neck: Neck supple. No tracheal deviation present. No thyromegaly present.  No cervical lymphadenopathy Cardiovascular: Normal rate, regular rhythm and normal heart sounds.   No murmur heard. No carotid bruit .  No edema Pulmonary/Chest: Effort normal and breath sounds normal. No respiratory distress. No has no wheezes. No rales.  Skin: Skin is warm and dry. Not diaphoretic.  Psychiatric: Normal mood and affect. Behavior is normal.      Assessment & Plan:    See Problem List for Assessment and Plan  of chronic medical problems.    This visit occurred during the SARS-CoV-2 public health emergency.  Safety protocols were in place, including screening questions prior to the visit, additional usage of staff PPE, and extensive cleaning of exam room while observing appropriate contact time as indicated for disinfecting solutions.

## 2020-02-09 ENCOUNTER — Encounter: Payer: Self-pay | Admitting: Internal Medicine

## 2020-02-09 ENCOUNTER — Ambulatory Visit (INDEPENDENT_AMBULATORY_CARE_PROVIDER_SITE_OTHER): Payer: Medicare Other | Admitting: Internal Medicine

## 2020-02-09 ENCOUNTER — Other Ambulatory Visit: Payer: Self-pay

## 2020-02-09 VITALS — BP 116/70 | HR 80 | Temp 98.0°F | Ht 70.0 in

## 2020-02-09 DIAGNOSIS — I10 Essential (primary) hypertension: Secondary | ICD-10-CM

## 2020-02-09 DIAGNOSIS — C799 Secondary malignant neoplasm of unspecified site: Secondary | ICD-10-CM | POA: Diagnosis not present

## 2020-02-09 DIAGNOSIS — F419 Anxiety disorder, unspecified: Secondary | ICD-10-CM

## 2020-02-09 DIAGNOSIS — E273 Drug-induced adrenocortical insufficiency: Secondary | ICD-10-CM | POA: Diagnosis not present

## 2020-02-09 DIAGNOSIS — S32512A Fracture of superior rim of left pubis, initial encounter for closed fracture: Secondary | ICD-10-CM | POA: Diagnosis not present

## 2020-02-09 DIAGNOSIS — F32A Depression, unspecified: Secondary | ICD-10-CM

## 2020-02-09 DIAGNOSIS — S72002A Fracture of unspecified part of neck of left femur, initial encounter for closed fracture: Secondary | ICD-10-CM

## 2020-02-09 DIAGNOSIS — E038 Other specified hypothyroidism: Secondary | ICD-10-CM | POA: Diagnosis not present

## 2020-02-09 DIAGNOSIS — C439 Malignant melanoma of skin, unspecified: Secondary | ICD-10-CM

## 2020-02-09 NOTE — Assessment & Plan Note (Signed)
Chronic Management per endocrine 

## 2020-02-09 NOTE — Assessment & Plan Note (Signed)
Chronic Controlled, stable Continue Lexapro 10 mg daily and clonazepam 1 mg daily as needed

## 2020-02-09 NOTE — Assessment & Plan Note (Signed)
Acute Following with orthopedics-Dr. Alvan Dame Pain medications per orthopedics Routine healing-currently not able to do PT

## 2020-02-09 NOTE — Assessment & Plan Note (Signed)
Chronic Management per [facility] 

## 2020-02-14 ENCOUNTER — Other Ambulatory Visit: Payer: Self-pay

## 2020-02-14 ENCOUNTER — Other Ambulatory Visit: Payer: Self-pay | Admitting: *Deleted

## 2020-02-14 ENCOUNTER — Observation Stay (HOSPITAL_COMMUNITY): Payer: Medicare Other | Admitting: Certified Registered Nurse Anesthetist

## 2020-02-14 ENCOUNTER — Emergency Department (HOSPITAL_COMMUNITY): Payer: Medicare Other

## 2020-02-14 ENCOUNTER — Inpatient Hospital Stay (HOSPITAL_COMMUNITY)
Admission: EM | Admit: 2020-02-14 | Discharge: 2020-02-20 | DRG: 378 | Disposition: A | Discharge status: Home Health | Payer: Medicare Other | Attending: Internal Medicine | Admitting: Internal Medicine

## 2020-02-14 ENCOUNTER — Observation Stay (HOSPITAL_COMMUNITY): Payer: Medicare Other

## 2020-02-14 ENCOUNTER — Encounter (HOSPITAL_COMMUNITY)
Admission: EM | Disposition: A | Discharge status: Home Health | Payer: Self-pay | Source: Home / Self Care | Attending: Internal Medicine

## 2020-02-14 ENCOUNTER — Encounter (HOSPITAL_COMMUNITY): Payer: Self-pay | Admitting: Student

## 2020-02-14 DIAGNOSIS — Z9884 Bariatric surgery status: Secondary | ICD-10-CM

## 2020-02-14 DIAGNOSIS — I110 Hypertensive heart disease with heart failure: Secondary | ICD-10-CM | POA: Diagnosis not present

## 2020-02-14 DIAGNOSIS — K289 Gastrojejunal ulcer, unspecified as acute or chronic, without hemorrhage or perforation: Secondary | ICD-10-CM | POA: Diagnosis not present

## 2020-02-14 DIAGNOSIS — E2740 Unspecified adrenocortical insufficiency: Secondary | ICD-10-CM | POA: Diagnosis present

## 2020-02-14 DIAGNOSIS — Z79899 Other long term (current) drug therapy: Secondary | ICD-10-CM

## 2020-02-14 DIAGNOSIS — Z20822 Contact with and (suspected) exposure to covid-19: Secondary | ICD-10-CM | POA: Diagnosis not present

## 2020-02-14 DIAGNOSIS — Z808 Family history of malignant neoplasm of other organs or systems: Secondary | ICD-10-CM

## 2020-02-14 DIAGNOSIS — Z96619 Presence of unspecified artificial shoulder joint: Secondary | ICD-10-CM

## 2020-02-14 DIAGNOSIS — Z8041 Family history of malignant neoplasm of ovary: Secondary | ICD-10-CM | POA: Diagnosis not present

## 2020-02-14 DIAGNOSIS — Z800 Family history of malignant neoplasm of digestive organs: Secondary | ICD-10-CM | POA: Diagnosis not present

## 2020-02-14 DIAGNOSIS — K2100 Gastro-esophageal reflux disease with esophagitis, without bleeding: Secondary | ICD-10-CM

## 2020-02-14 DIAGNOSIS — I951 Orthostatic hypotension: Secondary | ICD-10-CM | POA: Diagnosis not present

## 2020-02-14 DIAGNOSIS — C7931 Secondary malignant neoplasm of brain: Secondary | ICD-10-CM | POA: Diagnosis not present

## 2020-02-14 DIAGNOSIS — S32502A Unspecified fracture of left pubis, initial encounter for closed fracture: Secondary | ICD-10-CM | POA: Diagnosis not present

## 2020-02-14 DIAGNOSIS — Z7952 Long term (current) use of systemic steroids: Secondary | ICD-10-CM | POA: Diagnosis not present

## 2020-02-14 DIAGNOSIS — W19XXXD Unspecified fall, subsequent encounter: Secondary | ICD-10-CM | POA: Diagnosis present

## 2020-02-14 DIAGNOSIS — S72002D Fracture of unspecified part of neck of left femur, subsequent encounter for closed fracture with routine healing: Secondary | ICD-10-CM | POA: Diagnosis not present

## 2020-02-14 DIAGNOSIS — S32592D Other specified fracture of left pubis, subsequent encounter for fracture with routine healing: Secondary | ICD-10-CM

## 2020-02-14 DIAGNOSIS — Z96611 Presence of right artificial shoulder joint: Secondary | ICD-10-CM | POA: Diagnosis present

## 2020-02-14 DIAGNOSIS — Z8249 Family history of ischemic heart disease and other diseases of the circulatory system: Secondary | ICD-10-CM

## 2020-02-14 DIAGNOSIS — Z79890 Hormone replacement therapy (postmenopausal): Secondary | ICD-10-CM

## 2020-02-14 DIAGNOSIS — I10 Essential (primary) hypertension: Secondary | ICD-10-CM | POA: Diagnosis not present

## 2020-02-14 DIAGNOSIS — Z8719 Personal history of other diseases of the digestive system: Secondary | ICD-10-CM | POA: Diagnosis present

## 2020-02-14 DIAGNOSIS — M160 Bilateral primary osteoarthritis of hip: Secondary | ICD-10-CM | POA: Diagnosis not present

## 2020-02-14 DIAGNOSIS — E273 Drug-induced adrenocortical insufficiency: Secondary | ICD-10-CM | POA: Diagnosis not present

## 2020-02-14 DIAGNOSIS — Y92009 Unspecified place in unspecified non-institutional (private) residence as the place of occurrence of the external cause: Secondary | ICD-10-CM | POA: Diagnosis not present

## 2020-02-14 DIAGNOSIS — K922 Gastrointestinal hemorrhage, unspecified: Secondary | ICD-10-CM | POA: Diagnosis not present

## 2020-02-14 DIAGNOSIS — Z87891 Personal history of nicotine dependence: Secondary | ICD-10-CM

## 2020-02-14 DIAGNOSIS — E538 Deficiency of other specified B group vitamins: Secondary | ICD-10-CM | POA: Diagnosis present

## 2020-02-14 DIAGNOSIS — E611 Iron deficiency: Secondary | ICD-10-CM | POA: Diagnosis not present

## 2020-02-14 DIAGNOSIS — Z85820 Personal history of malignant melanoma of skin: Secondary | ICD-10-CM

## 2020-02-14 DIAGNOSIS — I959 Hypotension, unspecified: Secondary | ICD-10-CM | POA: Diagnosis not present

## 2020-02-14 DIAGNOSIS — R0902 Hypoxemia: Secondary | ICD-10-CM | POA: Diagnosis not present

## 2020-02-14 DIAGNOSIS — R531 Weakness: Secondary | ICD-10-CM | POA: Diagnosis not present

## 2020-02-14 DIAGNOSIS — D696 Thrombocytopenia, unspecified: Secondary | ICD-10-CM | POA: Diagnosis not present

## 2020-02-14 DIAGNOSIS — Z9181 History of falling: Secondary | ICD-10-CM

## 2020-02-14 DIAGNOSIS — M47816 Spondylosis without myelopathy or radiculopathy, lumbar region: Secondary | ICD-10-CM | POA: Diagnosis not present

## 2020-02-14 DIAGNOSIS — N400 Enlarged prostate without lower urinary tract symptoms: Secondary | ICD-10-CM | POA: Diagnosis present

## 2020-02-14 DIAGNOSIS — E230 Hypopituitarism: Secondary | ICD-10-CM | POA: Diagnosis not present

## 2020-02-14 DIAGNOSIS — D649 Anemia, unspecified: Secondary | ICD-10-CM | POA: Diagnosis not present

## 2020-02-14 DIAGNOSIS — F32A Depression, unspecified: Secondary | ICD-10-CM | POA: Diagnosis present

## 2020-02-14 DIAGNOSIS — R0600 Dyspnea, unspecified: Secondary | ICD-10-CM | POA: Diagnosis not present

## 2020-02-14 DIAGNOSIS — D62 Acute posthemorrhagic anemia: Secondary | ICD-10-CM | POA: Diagnosis not present

## 2020-02-14 DIAGNOSIS — K250 Acute gastric ulcer with hemorrhage: Secondary | ICD-10-CM | POA: Diagnosis not present

## 2020-02-14 DIAGNOSIS — N289 Disorder of kidney and ureter, unspecified: Secondary | ICD-10-CM | POA: Diagnosis not present

## 2020-02-14 DIAGNOSIS — Z801 Family history of malignant neoplasm of trachea, bronchus and lung: Secondary | ICD-10-CM

## 2020-02-14 DIAGNOSIS — E039 Hypothyroidism, unspecified: Secondary | ICD-10-CM | POA: Diagnosis not present

## 2020-02-14 DIAGNOSIS — S32592A Other specified fracture of left pubis, initial encounter for closed fracture: Secondary | ICD-10-CM

## 2020-02-14 DIAGNOSIS — F419 Anxiety disorder, unspecified: Secondary | ICD-10-CM | POA: Diagnosis not present

## 2020-02-14 DIAGNOSIS — K284 Chronic or unspecified gastrojejunal ulcer with hemorrhage: Secondary | ICD-10-CM | POA: Diagnosis not present

## 2020-02-14 DIAGNOSIS — Z803 Family history of malignant neoplasm of breast: Secondary | ICD-10-CM

## 2020-02-14 DIAGNOSIS — I509 Heart failure, unspecified: Secondary | ICD-10-CM | POA: Diagnosis not present

## 2020-02-14 DIAGNOSIS — K921 Melena: Secondary | ICD-10-CM | POA: Diagnosis not present

## 2020-02-14 DIAGNOSIS — W19XXXA Unspecified fall, initial encounter: Secondary | ICD-10-CM | POA: Diagnosis not present

## 2020-02-14 DIAGNOSIS — S32512A Fracture of superior rim of left pubis, initial encounter for closed fracture: Secondary | ICD-10-CM | POA: Diagnosis not present

## 2020-02-14 DIAGNOSIS — R58 Hemorrhage, not elsewhere classified: Secondary | ICD-10-CM | POA: Diagnosis not present

## 2020-02-14 DIAGNOSIS — S32810D Multiple fractures of pelvis with stable disruption of pelvic ring, subsequent encounter for fracture with routine healing: Secondary | ICD-10-CM | POA: Diagnosis not present

## 2020-02-14 DIAGNOSIS — K625 Hemorrhage of anus and rectum: Secondary | ICD-10-CM | POA: Diagnosis not present

## 2020-02-14 HISTORY — PX: ESOPHAGOGASTRODUODENOSCOPY (EGD) WITH PROPOFOL: SHX5813

## 2020-02-14 LAB — COMPREHENSIVE METABOLIC PANEL
ALT: 17 U/L (ref 0–44)
AST: 25 U/L (ref 15–41)
Albumin: 2.6 g/dL — ABNORMAL LOW (ref 3.5–5.0)
Alkaline Phosphatase: 129 U/L — ABNORMAL HIGH (ref 38–126)
Anion gap: 10 (ref 5–15)
BUN: 52 mg/dL — ABNORMAL HIGH (ref 8–23)
CO2: 19 mmol/L — ABNORMAL LOW (ref 22–32)
Calcium: 8.4 mg/dL — ABNORMAL LOW (ref 8.9–10.3)
Chloride: 110 mmol/L (ref 98–111)
Creatinine, Ser: 1 mg/dL (ref 0.61–1.24)
GFR, Estimated: >60 mL/min (ref >60)
Glucose, Bld: 134 mg/dL — ABNORMAL HIGH (ref 70–99)
Potassium: 4.5 mmol/L (ref 3.5–5.1)
Sodium: 139 mmol/L (ref 135–145)
Total Bilirubin: 0.2 mg/dL — ABNORMAL LOW (ref 0.3–1.2)
Total Protein: 4.8 g/dL — ABNORMAL LOW (ref 6.5–8.1)

## 2020-02-14 LAB — CBC
HCT: 22.3 % — ABNORMAL LOW (ref 39.0–52.0)
HCT: 24.7 % — ABNORMAL LOW (ref 39.0–52.0)
Hemoglobin: 7 g/dL — ABNORMAL LOW (ref 13.0–17.0)
Hemoglobin: 8.3 g/dL — ABNORMAL LOW (ref 13.0–17.0)
MCH: 29.8 pg (ref 26.0–34.0)
MCH: 30 pg (ref 26.0–34.0)
MCHC: 31.4 g/dL (ref 30.0–36.0)
MCHC: 33.6 g/dL (ref 30.0–36.0)
MCV: 94.9 fL (ref 80.0–100.0)
MCV: 89.2 fL (ref 80.0–100.0)
Platelets: 168 10*3/uL (ref 150–400)
Platelets: 155 10*3/uL (ref 150–400)
RBC: 2.35 MIL/uL — ABNORMAL LOW (ref 4.22–5.81)
RBC: 2.77 MIL/uL — ABNORMAL LOW (ref 4.22–5.81)
RDW: 14.8 % (ref 11.5–15.5)
RDW: 15.6 % — ABNORMAL HIGH (ref 11.5–15.5)
WBC: 9.5 10*3/uL (ref 4.0–10.5)
WBC: 8 10*3/uL (ref 4.0–10.5)
nRBC: 0 % (ref 0.0–0.2)
nRBC: 0.3 % — ABNORMAL HIGH (ref 0.0–0.2)

## 2020-02-14 LAB — HEMOGLOBIN A1C
Hgb A1c MFr Bld: 4.7 % — ABNORMAL LOW (ref 4.8–5.6)
Mean Plasma Glucose: 88.19 mg/dL

## 2020-02-14 LAB — CBC WITH DIFFERENTIAL/PLATELET
Abs Immature Granulocytes: 0.11 10*3/uL — ABNORMAL HIGH (ref 0.00–0.07)
Basophils Absolute: 0.1 10*3/uL (ref 0.0–0.1)
Basophils Relative: 1 %
Eosinophils Absolute: 0.2 10*3/uL (ref 0.0–0.5)
Eosinophils Relative: 2 %
HCT: 24.5 % — ABNORMAL LOW (ref 39.0–52.0)
Hemoglobin: 7.7 g/dL — ABNORMAL LOW (ref 13.0–17.0)
Immature Granulocytes: 1 %
Lymphocytes Relative: 39 %
Lymphs Abs: 3.6 10*3/uL (ref 0.7–4.0)
MCH: 29.7 pg (ref 26.0–34.0)
MCHC: 31.4 g/dL (ref 30.0–36.0)
MCV: 94.6 fL (ref 80.0–100.0)
Monocytes Absolute: 0.8 10*3/uL (ref 0.1–1.0)
Monocytes Relative: 9 %
Neutro Abs: 4.4 10*3/uL (ref 1.7–7.7)
Neutrophils Relative %: 48 %
Platelets: 194 10*3/uL (ref 150–400)
RBC: 2.59 MIL/uL — ABNORMAL LOW (ref 4.22–5.81)
RDW: 14.6 % (ref 11.5–15.5)
WBC: 9.2 10*3/uL (ref 4.0–10.5)
nRBC: 0 % (ref 0.0–0.2)

## 2020-02-14 LAB — I-STAT CHEM 8, ED
BUN: 57 mg/dL — ABNORMAL HIGH (ref 8–23)
Calcium, Ion: 1.23 mmol/L (ref 1.15–1.40)
Chloride: 107 mmol/L (ref 98–111)
Creatinine, Ser: 1 mg/dL (ref 0.61–1.24)
Glucose, Bld: 106 mg/dL — ABNORMAL HIGH (ref 70–99)
HCT: 21 % — ABNORMAL LOW (ref 39.0–52.0)
Hemoglobin: 7.1 g/dL — ABNORMAL LOW (ref 13.0–17.0)
Potassium: 4.8 mmol/L (ref 3.5–5.1)
Sodium: 139 mmol/L (ref 135–145)
TCO2: 23 mmol/L (ref 22–32)

## 2020-02-14 LAB — RESP PANEL BY RT-PCR (FLU A&B, COVID) ARPGX2
Influenza A by PCR: NEGATIVE
Influenza B by PCR: NEGATIVE
SARS Coronavirus 2 by RT PCR: NEGATIVE

## 2020-02-14 LAB — POC OCCULT BLOOD, ED: Fecal Occult Bld: POSITIVE — AB

## 2020-02-14 LAB — PROTIME-INR
INR: 1.1 (ref 0.8–1.2)
Prothrombin Time: 13.4 seconds (ref 11.4–15.2)

## 2020-02-14 LAB — GLUCOSE, CAPILLARY: Glucose-Capillary: 83 mg/dL (ref 70–99)

## 2020-02-14 LAB — PREPARE RBC (CROSSMATCH)

## 2020-02-14 LAB — CBG MONITORING, ED
Glucose-Capillary: 106 mg/dL — ABNORMAL HIGH (ref 70–99)
Glucose-Capillary: 105 mg/dL — ABNORMAL HIGH (ref 70–99)

## 2020-02-14 LAB — ABO/RH: ABO/RH(D): A POS

## 2020-02-14 SURGERY — ESOPHAGOGASTRODUODENOSCOPY (EGD) WITH PROPOFOL
Anesthesia: Monitor Anesthesia Care

## 2020-02-14 MED ORDER — FENTANYL CITRATE (PF) 100 MCG/2ML IJ SOLN
25.0000 ug | Freq: Four times a day (QID) | INTRAMUSCULAR | Status: DC | PRN
  Administered 2020-02-14 – 2020-02-15 (×4): 25 ug via INTRAVENOUS
  Filled 2020-02-14 (×4): qty 2

## 2020-02-14 MED ORDER — SODIUM CHLORIDE 0.9 % IV SOLN
8.0000 mg/h | INTRAVENOUS | Status: DC
  Administered 2020-02-14: 8 mg/h via INTRAVENOUS
  Filled 2020-02-14 (×2): qty 80

## 2020-02-14 MED ORDER — HYDROCODONE-ACETAMINOPHEN 5-325 MG PO TABS
1.0000 | ORAL_TABLET | ORAL | Status: DC | PRN
  Administered 2020-02-14: 1 via ORAL
  Filled 2020-02-14: qty 1

## 2020-02-14 MED ORDER — INSULIN ASPART 100 UNIT/ML ~~LOC~~ SOLN: 0.0000 [IU] | Freq: Every day | SUBCUTANEOUS | Status: DC

## 2020-02-14 MED ORDER — INSULIN ASPART 100 UNIT/ML ~~LOC~~ SOLN
0.0000 [IU] | Freq: Three times a day (TID) | SUBCUTANEOUS | Status: DC
  Filled 2020-02-14: qty 0.15

## 2020-02-14 MED ORDER — ACETAMINOPHEN 650 MG RE SUPP: 650.0000 mg | Freq: Four times a day (QID) | RECTAL | Status: DC | PRN

## 2020-02-14 MED ORDER — ACETAMINOPHEN 325 MG PO TABS: 650.0000 mg | ORAL_TABLET | Freq: Four times a day (QID) | ORAL | Status: DC | PRN

## 2020-02-14 MED ORDER — SODIUM CHLORIDE 0.9 % IV SOLN
10.0000 mL/h | Freq: Once | INTRAVENOUS | Status: AC
  Administered 2020-02-14: 10 mL/h via INTRAVENOUS

## 2020-02-14 MED ORDER — SODIUM CHLORIDE 0.9 % IV SOLN
80.0000 mg | Freq: Once | INTRAVENOUS | Status: AC
  Administered 2020-02-14: 80 mg via INTRAVENOUS
  Filled 2020-02-14: qty 80

## 2020-02-14 MED ORDER — HYDROCODONE-ACETAMINOPHEN 5-325 MG PO TABS
1.0000 | ORAL_TABLET | ORAL | Status: DC | PRN
  Administered 2020-02-14 (×2): 2 via ORAL
  Administered 2020-02-15: 1 via ORAL
  Administered 2020-02-15: 2 via ORAL
  Administered 2020-02-15: 1 via ORAL
  Administered 2020-02-16 – 2020-02-17 (×6): 2 via ORAL
  Filled 2020-02-14 (×2): qty 2
  Filled 2020-02-14: qty 1
  Filled 2020-02-14 (×6): qty 2
  Filled 2020-02-14: qty 1
  Filled 2020-02-14 (×2): qty 2

## 2020-02-14 MED ORDER — LEVOTHYROXINE SODIUM 100 MCG/5ML IV SOLN
44.0000 ug | Freq: Every day | INTRAVENOUS | Status: DC
  Filled 2020-02-14: qty 5

## 2020-02-14 MED ORDER — SODIUM CHLORIDE 0.9 % IV SOLN: INTRAVENOUS | Status: AC

## 2020-02-14 MED ORDER — PHENYLEPHRINE 40 MCG/ML (10ML) SYRINGE FOR IV PUSH (FOR BLOOD PRESSURE SUPPORT)
PREFILLED_SYRINGE | INTRAVENOUS | Status: DC | PRN
  Administered 2020-02-14: 120 ug via INTRAVENOUS
  Administered 2020-02-14 (×2): 80 ug via INTRAVENOUS

## 2020-02-14 MED ORDER — HYDROCORTISONE NA SUCCINATE PF 100 MG IJ SOLR
100.0000 mg | Freq: Once | INTRAMUSCULAR | Status: AC
  Administered 2020-02-14: 100 mg via INTRAVENOUS
  Filled 2020-02-14: qty 2

## 2020-02-14 MED ORDER — PANTOPRAZOLE SODIUM 40 MG IV SOLR
40.0000 mg | Freq: Two times a day (BID) | INTRAVENOUS | Status: DC
  Administered 2020-02-14 – 2020-02-15 (×2): 40 mg via INTRAVENOUS
  Filled 2020-02-14 (×2): qty 40

## 2020-02-14 MED ORDER — MORPHINE SULFATE (PF) 2 MG/ML IV SOLN
1.0000 mg | INTRAVENOUS | Status: DC | PRN
  Administered 2020-02-14: 1 mg via INTRAVENOUS
  Filled 2020-02-14: qty 1

## 2020-02-14 MED ORDER — HYDROCORTISONE NA SUCCINATE PF 100 MG IJ SOLR: 50.0000 mg | Freq: Three times a day (TID) | INTRAMUSCULAR | Status: DC

## 2020-02-14 MED ORDER — PROPOFOL 10 MG/ML IV BOLUS
INTRAVENOUS | Status: DC | PRN
  Administered 2020-02-14 (×2): 20 mg via INTRAVENOUS

## 2020-02-14 MED ORDER — TAMSULOSIN HCL 0.4 MG PO CAPS
0.4000 mg | ORAL_CAPSULE | Freq: Every day | ORAL | Status: DC
  Administered 2020-02-14 – 2020-02-19 (×6): 0.4 mg via ORAL
  Filled 2020-02-14 (×6): qty 1

## 2020-02-14 MED ORDER — FENTANYL CITRATE (PF) 100 MCG/2ML IJ SOLN
50.0000 ug | Freq: Once | INTRAMUSCULAR | Status: AC
  Administered 2020-02-14: 50 ug via INTRAVENOUS
  Filled 2020-02-14: qty 2

## 2020-02-14 MED ORDER — PREDNISONE 5 MG PO TABS
5.0000 mg | ORAL_TABLET | Freq: Every day | ORAL | Status: DC
  Administered 2020-02-14 – 2020-02-17 (×4): 5 mg via ORAL
  Filled 2020-02-14 (×6): qty 1

## 2020-02-14 MED ORDER — SUCRALFATE 1 GM/10ML PO SUSP
1.0000 g | Freq: Three times a day (TID) | ORAL | Status: DC
  Administered 2020-02-14 – 2020-02-20 (×21): 1 g via ORAL
  Filled 2020-02-14 (×21): qty 10

## 2020-02-14 MED ORDER — LEVOTHYROXINE SODIUM 100 MCG/5ML IV SOLN
44.0000 ug | Freq: Every day | INTRAVENOUS | Status: DC
  Administered 2020-02-14 – 2020-02-15 (×2): 44 ug via INTRAVENOUS
  Filled 2020-02-14 (×2): qty 5

## 2020-02-14 MED ORDER — PROPOFOL 500 MG/50ML IV EMUL
INTRAVENOUS | Status: DC | PRN
  Administered 2020-02-14: 100 ug/kg/min via INTRAVENOUS

## 2020-02-14 MED ORDER — CLONAZEPAM 0.5 MG PO TABS
1.0000 mg | ORAL_TABLET | Freq: Every day | ORAL | Status: DC
  Administered 2020-02-14 – 2020-02-20 (×7): 1 mg via ORAL
  Filled 2020-02-14 (×7): qty 2

## 2020-02-14 MED ORDER — SODIUM CHLORIDE 0.9 % IV BOLUS
500.0000 mL | Freq: Once | INTRAVENOUS | Status: AC
  Administered 2020-02-14: 500 mL via INTRAVENOUS

## 2020-02-14 MED ORDER — LIDOCAINE 2% (20 MG/ML) 5 ML SYRINGE
INTRAMUSCULAR | Status: DC | PRN
  Administered 2020-02-14: 60 mg via INTRAVENOUS

## 2020-02-14 MED ORDER — ESCITALOPRAM OXALATE 10 MG PO TABS
10.0000 mg | ORAL_TABLET | Freq: Every day | ORAL | Status: DC
  Administered 2020-02-14 – 2020-02-19 (×6): 10 mg via ORAL
  Filled 2020-02-14 (×7): qty 1

## 2020-02-14 MED ORDER — STERILE WATER FOR INJECTION IJ SOLN
INTRAMUSCULAR | Status: AC
  Administered 2020-02-14: 2 mL
  Filled 2020-02-14: qty 10

## 2020-02-14 SURGICAL SUPPLY — 14 items

## 2020-02-14 NOTE — ED Provider Notes (Signed)
[facility] EMERGENCY DEPARTMENT Provider Note   CSN: [id number] Arrival date & time: 02/14/20  1194     History Chief Complaint  Patient presents with  . Rectal Bleeding    Maxwell Fox is a 75 y.o. male with a hx of iron deficiency anemia, B12 deficiency, diverticulosis, hypertension, adrenal insufficiency on prednisone S/p chemo, & Roux-en-Y gastric bypass who presents to the ED via EMS for evaluation after a fall this evening. Patient states that he has been feeling poorly for the past 2- 3 days with generalized weakness/fatigue as well as multiple episodes of loose melena per day. This evening he got out of bed to go to the bathroom with his walker which he has been utilizing since his pelvic fx, he felt so weak that he slowly collapsed to the ground. He denies head injury or LOC, states he did not injure himself. States he did have a bowel movement when he fell to the ground. Per EMS to triage on arrival patient had dark tarry stool on the ground, his BP was 80 palpated and his SPO2 was 75% on RA, they administered 500 cc of NS w/ improvement to 120/90 and applied NRB with SPO2 100%. Patient denies nausea, vomiting, abdominal pain, hematemesis, chest pain, dyspnea, coughing, or syncope. He denies alcohol use or anticoagulation. He does report that he was taking 800 mg of ibuprofen TID the other day. He does not think he sees a GI doctor regularly.   HPI     Past Medical History:  Diagnosis Date  . B12 nutritional deficiency    malabsorbtion  . BPH (benign prostatic hypertrophy)   . Diverticulosis   . Hypertension   . Iron deficiency anemia    malabsorption related (s/p gastric bypass)  . Melanoma ([place])   . Obesity    s/p gastric bypass 2000, start 355#  . Pancreatic cyst   . Pulmonary nodules     Patient Active Problem List   Diagnosis Date Noted  . Closed fracture of left superior pubic ramus ([place]) 02/09/2020  . Anxiety and depression 01/10/2018  .  History of antineoplastic chemotherapy 07/19/2017  . Chronic midline low back pain without sciatica 04/19/2017  . Metastatic melanoma ([place]) 01/07/2017  . Adrenal insufficiency due to cancer therapy ([place]) 07/27/2016  . Secondary hypothyroidism 07/27/2016  . H/O gastric bypass 01/06/2016  . Pancreatic cyst 01/06/2016  . Metastatic melanoma of brain 12/31/2015  . Iron deficiency anemia   . B12 nutritional deficiency   . Benign prostatic hyperplasia     Past Surgical History:  Procedure Laterality Date  . CRANIOTOMY FOR TUMOR  12/11/2015   metastatic melanoma  . MOHS SURGERY  2012   melanoma, mid back, [place]  . ROUX-EN-Y PROCEDURE  2000  . TOTAL SHOULDER REPLACEMENT Right 2011   [place]       Family History  Problem Relation Age of Onset  . Hypertension Father   . Macular degeneration Mother   . Melanoma Mother   . Melanoma Sister   . Breast cancer Sister   . Uterine cancer Sister   . Stomach cancer Sister   . Melanoma Brother     Social History   Tobacco Use  . Smoking status: Former Smoker    Quit date: 02/03/1963    Years since quitting: 57.0  . Smokeless tobacco: Never Used    Home Medications Prior to Admission medications   Medication Sig Start Date End Date Taking? Authorizing Provider  calcium carbonate (OS-CAL - DOSED  IN MG OF ELEMENTAL CALCIUM) 1250 (500 Ca) MG tablet Take 1 tablet by mouth every evening.    [provider]  Cholecalciferol (VITAMIN D) 2000 UNITS tablet Take 1 tablet (2,000 Units total) by mouth daily. 10/26/13   [name], MD  clonazePAM (KLONOPIN) 1 MG tablet Take 1 tablet (1 mg total) by mouth daily as needed. 01/22/20   Oswald Hillock, MD  docusate sodium (COLACE) 100 MG capsule Take 1 capsule (100 mg total) by mouth 2 (two) times daily. 01/22/20   Oswald Hillock, MD  escitalopram (LEXAPRO) 10 MG tablet TAKE 1 TABLET(10 MG) BY MOUTH DAILY Patient taking differently: Take 10 mg by mouth at bedtime. 11/23/19   [name], MD  HYDROcodone-acetaminophen (NORCO/VICODIN) 5-325 MG tablet Take 1 tablet by mouth every 4 (four) hours as needed. 02/05/20   [provider]  levothyroxine (SYNTHROID) 88 MCG tablet Take 1 tablet (88 mcg total) by mouth daily before breakfast. 06/26/19   [name], MD  Multiple Vitamins-Minerals (MULTIVITAMIN ADULTS) TABS Take 1 tablet by mouth every evening.    [provider]  oxyCODONE-acetaminophen (PERCOCET/ROXICET) 5-325 MG tablet Take 1-2 tablets by mouth every 4 (four) hours as needed for severe pain. 01/22/20   Oswald Hillock, MD  predniSONE (DELTASONE) 5 MG tablet Take 1 tablet (5 mg total) by mouth daily with breakfast. 06/26/19   [name], MD  tamsulosin (FLOMAX) 0.4 MG CAPS capsule TAKE 1 CAPSULE(0.4 MG) BY MOUTH DAILY AFTER SUPPER 11/30/19   [name], MD    Allergies    Patient has no known allergies.  Review of Systems   Review of Systems  Constitutional: Positive for fatigue. Negative for chills and fever.  Respiratory: Negative for cough and shortness of breath.   Cardiovascular: Negative for chest pain.  Gastrointestinal: Positive for blood in stool. Negative for abdominal pain, constipation, nausea, rectal pain and vomiting.  Genitourinary: Negative for dysuria.  Neurological: Positive for weakness. Negative for syncope.  All other systems reviewed and are negative.   Physical Exam Updated Vital Signs BP 115/69   Pulse 73   Temp (!) 97.4 F (36.3 C) (Rectal)   Resp 14   SpO2 100%   Physical Exam Vitals and nursing note reviewed. Exam conducted with a chaperone present.  Constitutional:      General: He is not in acute distress.    Appearance: He is well-developed. He is ill-appearing. He is not toxic-appearing.     Comments: Pale appearing.   HENT:     Head: Normocephalic and atraumatic.  Eyes:     General:        Right eye: No discharge.        Left eye: No discharge.     Comments: Conjunctival pallor  noted  Cardiovascular:     Rate and Rhythm: Normal rate and regular rhythm.  Pulmonary:     Effort: Pulmonary effort is normal. No respiratory distress.     Breath sounds: Normal breath sounds. No wheezing, rhonchi or rales.     Comments: Currently on 15L NRB with no signs of respiratory distress, lungs clear, will plan to titrate off of this.  Abdominal:     General: There is no distension.     Palpations: Abdomen is soft.     Tenderness: There is no abdominal tenderness. There is no guarding or rebound.  Genitourinary:    Comments: There is significant melena present to the external rectal area. No bright red blood  noted.  Musculoskeletal:     Cervical back: Normal range of motion and neck supple. No tenderness.     Comments: Upper/lower extremities: Dried melenous blood to the distal lower legs.  Patient able to actively move all joints. He has some left anterior pelvis/hip tenderness which he states is consistent w/ his prior injury- he is able to actively lift the LLE off of the bed and bend his knee, he reports no change in pain. No other focal bony tenderness to palpation.  Back: No point/focal vertebral tenderness/palpable step off.   Skin:    General: Skin is dry.     Coloration: Skin is pale.     Findings: No rash.  Neurological:     Mental Status: He is alert.     Comments: Clear speech.   Psychiatric:        Behavior: Behavior normal.     ED Results / Procedures / Treatments   Labs (all labs ordered are listed, but only abnormal results are displayed) Labs Reviewed  COMPREHENSIVE METABOLIC PANEL - Abnormal; Notable for the following components:      Result Value   CO2 19 (*)    Glucose, Bld 134 (*)    BUN 52 (*)    Calcium 8.4 (*)    Total Protein 4.8 (*)    Albumin 2.6 (*)    Alkaline Phosphatase 129 (*)    Total Bilirubin 0.2 (*)    All other components within normal limits  CBC WITH DIFFERENTIAL/PLATELET - Abnormal; Notable for the following components:    RBC 2.59 (*)    Hemoglobin 7.7 (*)    HCT 24.5 (*)    Abs Immature Granulocytes 0.11 (*)    All other components within normal limits  POC OCCULT BLOOD, ED - Abnormal; Notable for the following components:   Fecal Occult Bld POSITIVE (*)    All other components within normal limits  I-STAT CHEM 8, ED - Abnormal; Notable for the following components:   BUN 57 (*)    Glucose, Bld 106 (*)    Hemoglobin 7.1 (*)    HCT 21.0 (*)    All other components within normal limits  RESP PANEL BY RT-PCR (FLU A&B, COVID) ARPGX2  PROTIME-INR  TYPE AND SCREEN  PREPARE RBC (CROSSMATCH)  ABO/RH    EKG EKG Interpretation  Date/Time:  Wednesday February 14 2020 02:50:37 EST Ventricular Rate:  84 PR Interval:    QRS Duration: 98 QT Interval:  370 QTC Calculation: 438 R Axis:   -27 Text Interpretation: Sinus rhythm Borderline left axis deviation Abnormal R-wave progression, early transition Abnormal T, consider ischemia, lateral leads Confirmed by Orpah Greek [phone]) on 02/14/2020 3:06:16 AM   Radiology DG Pelvis Portable  Result Date: 02/14/2020 CLINICAL DATA:  Left hip/pelvic pain.  History of fall. EXAM: PORTABLE PELVIS 1-2 VIEWS COMPARISON:  01/17/2020. FINDINGS: Left superior pubic ramus fracture again noted. Fracture of the left inferior pubic ramus is also most likely present. Hips are intact. Diffuse osteopenia. Degenerative change lumbar spine and both hips. IMPRESSION: Left superior pubic ramus fracture again noted. Fracture of the left inferior pubic ramus is also most likely present. No evidence of hip fracture. Electronically Signed   By: Marcello Moores  Register   On: 02/14/2020 06:08   DG Chest Port 1 View  Result Date: 02/14/2020 CLINICAL DATA:  Rectal bleeding, fall 3 weeks prior EXAM: PORTABLE CHEST 1 VIEW COMPARISON:  01/17/2020 FINDINGS: No consolidation, features of edema, pneumothorax, or effusion. Pulmonary vascularity is  normally distributed. The aorta is calcified. The  remaining cardiomediastinal contours are unremarkable. No acute osseous or soft tissue abnormality. Degenerative changes are present in the imaged spine and left shoulder. Prior right shoulder arthroplasty. Surgical clips project over the bilateral axilla and in the upper abdomen. Telemetry leads overlie the chest. IMPRESSION: No acute cardiopulmonary abnormality. Aortic Atherosclerosis (ICD10-I70.0). Electronically Signed   By: Lovena Le M.D.   On: 02/14/2020 03:21    Procedures .Critical Care Performed by: Amaryllis Dyke, PA-C Authorized by: Amaryllis Dyke, PA-C    CRITICAL CARE Performed by: Kennith Maes   Total critical care time: 35 minutes  Critical care time was exclusive of separately billable procedures and treating other patients.  Critical care was necessary to treat or prevent imminent or life-threatening deterioration.  Critical care was time spent personally by me on the following activities: development of treatment plan with patient and/or surrogate as well as nursing, discussions with consultants, evaluation of patient's response to treatment, examination of patient, obtaining history from patient or surrogate, ordering and performing treatments and interventions, ordering and review of laboratory studies, ordering and review of radiographic studies, pulse oximetry and re-evaluation of patient's condition. (including critical care time)  Medications Ordered in ED Medications - No data to display  ED Course  I have reviewed the triage vital signs and the nursing notes.  Pertinent labs & imaging results that were available during my care of the patient were reviewed by me and considered in my medical decision making (see chart for details).    MDM Rules/Calculators/A&P                         Patient presents to the ED with complaints of fatigue & melena x 3 days with collapse tonight. Patient is pale appearing, vitals without significant  abnormality upon arrival, not tachycardic or hypotensive, on NRB which we will attempt to titrate down from. Gross melena on exam. Protonix bolus & drip initiated, with his hx of adrenal insufficiency a dose of stress steroids ordered as well.   Additional history obtained:  Additional history obtained from chat review & nursing note review.   EKG; no STEMI Lab Tests:  I Ordered, reviewed, and interpreted labs, which included:  CBC: Anemia w/ hgb/hct 7.7/23.5 CMP: BUN elevated. Hypoalbuminemia.  PT/INR: WNL Fecal occult testing: positive.   Imaging Studies ordered:  I ordered imaging studies which included CXR & pelvic x-ray - I independently visualized and interpreted imaging which showed:  CXR: No acute cardiopulmonary abnormality. Aortic Atherosclerosis  Pelvis x-ray: Left superior pubic ramus fracture again noted. Fracture of the left inferior pubic ramus is also most likely present. No evidence of hip fracture.  Fentanyl ordered for L pelvis pain. No obvious visible signs of injury on primary/secondary/tertiary exam. He is currently saturatin 100% on RA without signs of respiratory distress or hypoxia. Overall patient with suspect upper GI bleed, melena on exam, hgb/hct 7.7/23.5- given active melena will proceed with blood transfusion- 1 unit PRBC ordered, currently on protonix drip. Will send message to GI for consultation & discuss w/ hospitalist service for admission.   05:38: CONSULT: Discussed with hospitalist Dr. Hal Hope- accepts admission.   Findings and plan of care discussed with supervising physician Dr. Betsey Holiday who has evaluated the patient & is in agreement.   Portions of this note were generated with [employer]. Dictation errors may occur despite best attempts at proofreading.  Final Clinical Impression(s) / ED Diagnoses  Final diagnoses:  Dyspnea  Gastrointestinal hemorrhage, unspecified gastrointestinal hemorrhage type  Closed fracture of left  inferior pubic ramus, initial encounter [facility])    Rx / DC Orders ED Discharge Orders    None       Amaryllis Dyke, PA-C 02/14/20 2254    Orpah Greek, MD 02/15/20 [phone]

## 2020-02-14 NOTE — H&P (View-Only) (Signed)
[facility] Consult: 8:19 AM 02/14/2020  LOS: 0 days    Referring Provider: Dr Wynelle Cleveland Primary Care Physician:  [name], MD Primary Gastroenterologist:  Dr. Hilarie Fredrickson.   Oncologist at [facility] Dr Nicki Reaper Colin Rhein.  Dr. Raynelle Chary at [facility] [phone] Pt cell: [phone].      Reason for Consultation:  FOBT + normocytic anemia.     HPI: Maxwell Fox is a 75 y.o. male.  PMH  Malignant melanoma upper back excised 2011 w brain mets, craniotomy 2017, chest nodules 2017 s/p pembrolizumab 2018; tibial and brain mets w ipilimumab, nivolumab 2018.  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  Stable 2.2 x 2 cm IPMN and some smaller pancreatic lesions  per CT 11/2018.  Stable pancreatic cystic lesions, stable nodule L temp lobe per 09/2019 CTs.    adrenal insufficiency, on low-dose prednisone daily s/p bariatric R n Y gastric bypass.  B 12 def/malabsorption. Hypothyroidism, labs ok in 08/2019.   Deg spine disease.  LV EF 50% on 08/2018 Echo.  Diverticulosis per CT scans.   L pubic rami fx 01/17/2020, no surgery.  S/p R shoulder arthroplasty.   Seen at GI office by Dr. Hilarie Fredrickson in 02/2016 regarding the IPMN's.  He recommended surveillance with MRI.  EUS and FNA would be a challenge given his Roux-en-Y anatomy.   2013 Colonoscopy in [place], noted as normal.    Meds include Prednisone 5 mg/day.  No PPI etc.    2 to 3 d of black stool, progressive weakness.  So weak he was unable to stand up using walker and fell at home.  No nausea, vomiting, change in appetite.  No significant weight fluctuation.  No abdominal pain.  Does not use NSAIDs.  His only complaint is pain in the left hip.  Has that prior to his fall and hip fracture a month ago, he walked up to 5  miles daily and was quite active.  Hgb 7.7, was 12.6 one month ago.   BUN elevated to 57, previously normal Alk phos elevated 129 but O/w normal LFTs.   INR normal.    Family Hx:  Marland Kitchen Melanoma Mother  . Ovarian cancer Sister  . Breast cancer Sister  . Stomach cancer Sister  . Melanoma Brother  . Lung cancer Maternal Aunt   Social Hx Quit smoking 1974.  Retired Clinical biochemist.    Past Medical History:  Diagnosis Date  . B12 nutritional deficiency    malabsorbtion  . BPH (benign prostatic hypertrophy)   . Diverticulosis   . Hypertension   . Iron deficiency anemia    malabsorption related (s/p gastric bypass)  . Melanoma ([employer])   . Obesity    s/p gastric bypass 2000, start 355#  . Pancreatic cyst   . Pulmonary nodules     Past Surgical History:  Procedure Laterality Date  . CRANIOTOMY FOR TUMOR  12/11/2015   metastatic melanoma  . MOHS SURGERY  2012   melanoma, mid back, [place]  .  ROUX-EN-Y PROCEDURE  2000  . TOTAL SHOULDER REPLACEMENT Right 2011   [place]    Prior to Admission medications   Medication Sig Start Date End Date Taking? Authorizing Provider  calcium carbonate (OS-CAL - DOSED IN MG OF ELEMENTAL CALCIUM) 1250 (500 Ca) MG tablet Take 1 tablet by mouth every evening.   Yes [provider]  Cholecalciferol (VITAMIN D) 2000 UNITS tablet Take 1 tablet (2,000 Units total) by mouth daily. 10/26/13  Yes [name], MD  clonazePAM (KLONOPIN) 1 MG tablet Take 1 tablet (1 mg total) by mouth daily as needed. Patient taking differently: Take 1 mg by mouth daily as needed for anxiety. 01/22/20  Yes [name], [name], MD  escitalopram (LEXAPRO) 10 MG tablet TAKE 1 TABLET(10 MG) BY MOUTH DAILY Patient taking differently: Take 10 mg by mouth at bedtime. 11/23/19  Yes Burns, [name], MD  HYDROcodone-acetaminophen (NORCO/VICODIN) 5-325 MG tablet Take 1 tablet by mouth every 4 (four) hours as needed for moderate pain. 02/05/20  Yes [provider]   levothyroxine (SYNTHROID) 88 MCG tablet Take 1 tablet (88 mcg total) by mouth daily before breakfast. 06/26/19  Yes [name], MD  Multiple Vitamins-Minerals (MULTIVITAMIN ADULTS) TABS Take 1 tablet by mouth every evening.   Yes [provider]  predniSONE (DELTASONE) 5 MG tablet Take 1 tablet (5 mg total) by mouth daily with breakfast. 06/26/19  Yes [name], MD  tamsulosin (FLOMAX) 0.4 MG CAPS capsule TAKE 1 CAPSULE(0.4 MG) BY MOUTH DAILY AFTER SUPPER Patient taking differently: Take 0.4 mg by mouth daily after supper. 11/30/19  Yes Burns, [name], MD    Scheduled Meds: . clonazePAM  1 mg Oral Daily  . escitalopram  10 mg Oral QHS  . levothyroxine  44 mcg Intravenous Daily  . predniSONE  5 mg Oral Q breakfast  . tamsulosin  0.4 mg Oral QPC supper   Infusions: . sodium chloride    . sodium chloride    . pantoprozole (PROTONIX) infusion 8 mg/hr (02/14/20 0541)   PRN Meds: acetaminophen **OR** acetaminophen, fentaNYL (SUBLIMAZE) injection, HYDROcodone-acetaminophen   Allergies as of 02/14/2020  . (No Known Allergies)    Family History  Problem Relation Age of Onset  . Hypertension Father   . Macular degeneration Mother   . Melanoma Mother   . Melanoma Sister   . Breast cancer Sister   . Uterine cancer Sister   . Stomach cancer Sister   . Melanoma Brother     Social History   Socioeconomic History  . Marital status: Married    Spouse name: Not on file  . Number of children: 2  . Years of education: Not on file  . Highest education level: Not on file  Occupational History  . Not on file  Tobacco Use  . Smoking status: Former Smoker    Quit date: 02/03/1963    Years since quitting: 57.0  . Smokeless tobacco: Never Used  Substance and Sexual Activity  . Alcohol use: Not on file    Comment: wine 2x/week  . Drug use: Not on file  . Sexual activity: Not on file  Other Topics Concern  . 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  No angina GU:  No hematuria, no frequency.  No hesitancy. GI: See HPI Heme: Denies unusual or excessive bleeding or bruising Transfusions: None Neuro:  No headaches, no peripheral tingling or numbness.  No syncope, no seizures Derm:  No itching, no rash or sores.  Endocrine:  No sweats or chills.  No polyuria or dysuria Immunization: COVID vaccinated x3.   PHYSICAL EXAM: Vital signs in last 24 hours: Vitals:   02/14/20 0751 02/14/20 0817  BP:  121/71  Pulse:  82  Resp:  20  Temp: 98.3 F (36.8 C)   SpO2:  100%   Wt Readings from Last 3 Encounters:  02/14/20 88.5 kg  01/17/20 88 kg  08/18/19 88.9 kg    General: Pale, looks moderately chronically ill.  Alert, comfortable on stretcher Head: No facial asymmetry or swelling.  No signs of head trauma. Eyes: No scleral icterus or conjunctival pallor.  EOMI Ears: Not hard of hearing Nose: No congestion or discharge Mouth: Dentition.  Tongue midline.  Mucosa pink, moist, clear. Neck: No JVD, no masses, no thyromegaly Lungs: No labored breathing or cough Heart: RRR. Abdomen: Soft, not tender, not distended.  Well-healed long upper midline scar consistent with gastric bypass.  No HSM, masses, bruits, hernias.   Rectal: Not repeated, black, FOBT positive stool per ED staff. Musc/Skeltl: No joint redness, swelling or gross deformity.  Large scar consistent with replacement surgery on right shoulder. Extremities: No  CCE. Neurologic: Oriented x3.  Good historian.  No tremors, no gross weakness, strength not tested though. Skin: No rash, no sores, no telangiectasia. Tattoos: None noted Nodes: No cervical adenopathy Psych: Calm, pleasant, cooperative, fluid speech.  Intake/Output from previous day: No intake/output data recorded. Intake/Output this shift: No intake/output data recorded.  LAB RESULTS: Recent Labs    02/14/20 0257 02/14/20 0346  WBC 9.2  --   HGB 7.7* 7.1*  HCT 24.5* 21.0*  PLT 194  --    BMET Lab Results  Component Value Date   NA 139 02/14/2020   NA 139 02/14/2020   NA 143 01/17/2020   K 4.8 02/14/2020   K 4.5 02/14/2020   K 4.1 01/17/2020   CL 107 02/14/2020   CL 110 02/14/2020   CL 107 01/17/2020   CO2 19 (L) 02/14/2020   CO2 28 01/17/2020   CO2 26 08/16/2018   GLUCOSE 106 (H) 02/14/2020   GLUCOSE 134 (H) 02/14/2020   GLUCOSE 86 01/17/2020   BUN 57 (H) 02/14/2020   BUN 52 (H) 02/14/2020   BUN 18 01/17/2020   CREATININE 1.00 02/14/2020   CREATININE 1.00 02/14/2020   CREATININE 0.76 01/17/2020   CALCIUM 8.4 (L) 02/14/2020   CALCIUM 9.3 01/17/2020   CALCIUM 9.0 08/16/2018   LFT Recent Labs    02/14/20 0257  PROT 4.8*  ALBUMIN 2.6*  AST 25  ALT 17  ALKPHOS 129*  BILITOT 0.2*   PT/INR Lab Results  Component Value Date   INR 1.1 02/14/2020   Hepatitis Panel No results for input(s): HEPBSAG, HCVAB, HEPAIGM, HEPBIGM in the last 72 hours. C-Diff No components found for: CDIFF Lipase  No results found for: LIPASE  Drugs of Abuse  No results found for:  LABOPIA, COCAINSCRNUR, LABBENZ, AMPHETMU, THCU, LABBARB   RADIOLOGY STUDIES: DG Pelvis Portable  Result Date: 02/14/2020 CLINICAL DATA:  Left hip/pelvic pain.  History of fall. EXAM: PORTABLE PELVIS 1-2 VIEWS COMPARISON:  01/17/2020. FINDINGS: Left superior pubic ramus fracture again noted. Fracture of the left inferior pubic ramus is also most likely present. Hips are intact. Diffuse osteopenia.  Degenerative change lumbar spine and both hips. IMPRESSION: Left superior pubic ramus fracture again noted. Fracture of the left inferior pubic ramus is also most likely present. No evidence of hip fracture. Electronically Signed   By: Marcello Moores  Register   On: 02/14/2020 06:08   DG Chest Port 1 View  Result Date: 02/14/2020 CLINICAL DATA:  Rectal bleeding, fall 3 weeks prior EXAM: PORTABLE CHEST 1 VIEW COMPARISON:  01/17/2020 FINDINGS: No consolidation, features of edema, pneumothorax, or effusion. Pulmonary vascularity is normally distributed. The aorta is calcified. The remaining cardiomediastinal contours are unremarkable. No acute osseous or soft tissue abnormality. Degenerative changes are present in the imaged spine and left shoulder. Prior right shoulder arthroplasty. Surgical clips project over the bilateral axilla and in the upper abdomen. Telemetry leads overlie the chest. IMPRESSION: No acute cardiopulmonary abnormality. Aortic Atherosclerosis (ICD10-I70.0). Electronically Signed   By: Lovena Le M.D.   On: 02/14/2020 03:21      IMPRESSION:    *   FOBT +. Normocytic anemia.  Hgb 12.3 >> 7.1 over 1 month.    *   Malignant melenoma.  Previous mets to brain.    *   Elevated BUN,  Normal creat.    *   Pelvic fracture.  Xray w known L superior pubic rami fx, and likely L inferior rami fx.    *   Elevated alk phos, O/w normal LFTs.  Not surprising in setting of fracture.   Stable pancreatic IPMNs per [facility] at [facility] in last few years.  ? of fistula between bypassed stomach to jejunal Roux limb on CT of 02/2018.  No fistula seen on subsequent CTs later that year.  *   S/p gastric bypass 2000  *   Hx Melanoma.      PLAN:     *  EGD today.   Continue PPI drip and NPO.  CBC this afternoon.     [name]  02/14/2020, 8:19 AM Phone [phone]

## 2020-02-14 NOTE — Anesthesia Procedure Notes (Signed)
Procedure Name: MAC Date/Time: 02/14/2020 1:52 PM Performed by: Colin Benton, CRNA Pre-anesthesia Checklist: Patient identified, Emergency Drugs available, Suction available and Patient being monitored Patient Re-evaluated:Patient Re-evaluated prior to induction Oxygen Delivery Method: Nasal cannula Induction Type: IV induction Placement Confirmation: positive ETCO2 Difficulty Due To: Difficulty was unanticipated

## 2020-02-14 NOTE — Evaluation (Signed)
Physical Therapy Evaluation Patient Details Name: Onyekachi Gathright MRN: [id number] DOB: 07/10/1945 Today's Date: 02/14/2020   History of Present Illness  75 year old male with gastric bypass, melanoma, chronic adrenal insufficiency, recent fall with pelvic fractures being treated with NSAIDs who presents for black stools and severe weakness. Pt underwent upper GI endoscopy on 02/14/2020.  Clinical Impression  Pt presents to PT with deficits in functional mobility, strength, power, endurance, gait. Pt demonstrates LLE weakness but is able to perform bed mobility, transfer, and initiate gait training with little physical assistance. Pt will benefit from acute PT follow-up to progress gait training to household distances and to assess stair negotiation abilities to ensure the pt is able to ascend 2 steps to enter his home. PT recommends HHPT services at the time of discharge if the pt and family are agreeable.    Follow Up Recommendations Home health PT;Supervision for mobility/OOB    Equipment Recommendations  None recommended by PT    Recommendations for Other Services       Precautions / Restrictions Precautions Precautions: Fall Restrictions Weight Bearing Restrictions: Yes LLE Weight Bearing: Weight bearing as tolerated Other Position/Activity Restrictions: WBAT documented per last ortho note. Pt reports at outpatient follow-up the orthopedic doctor reported to limit WB through LLE. Pt reports that he is not NWB but that the doctor wanted less ambulation than was occuring at SNF      Mobility  Bed Mobility Overal bed mobility: Needs Assistance Bed Mobility: Supine to Sit     Supine to sit: Supervision          Transfers Overall transfer level: Needs assistance Equipment used: 1 person hand held assist Transfers: Sit to/from Stand Sit to Stand: Min guard            Ambulation/Gait Ambulation/Gait assistance: Min guard Gait Distance (Feet): 2 Feet Assistive device:  1 person hand held assist Gait Pattern/deviations: Step-to pattern Gait velocity: reduced Gait velocity interpretation: <1.31 ft/sec, indicative of household ambulator General Gait Details: pt takes 3-4 side steps at the edge of bed. Pt declines further activity  Stairs            Wheelchair Mobility    Modified Rankin (Stroke Patients Only)       Balance Overall balance assessment: Needs assistance Sitting-balance support: No upper extremity supported;Feet supported Sitting balance-Leahy Scale: Good     Standing balance support: Single extremity supported;No upper extremity supported Standing balance-Leahy Scale: Fair                               Pertinent Vitals/Pain Pain Assessment: Faces Faces Pain Scale: Hurts even more Pain Location: L hip Pain Descriptors / Indicators: Aching Pain Intervention(s): Monitored during session    Home Living Family/patient expects to be discharged to:: Private residence Living Arrangements: Spouse/significant other Available Help at Discharge: Family;Available 24 hours/day Type of Home: House Home Access: Stairs to enter Entrance Stairs-Rails: Left Entrance Stairs-Number of Steps: 2 Home Layout: One level;Able to live on main level with bedroom/bathroom Home Equipment: Grab bars - toilet;Shower seat - built in;Walker - 2 wheels;Wheelchair - manual;Shower seat      Prior Function Level of Independence: Independent         Comments: ambulating with RW since discharge from SNF     Hand Dominance   Dominant Hand: Right    Extremity/Trunk Assessment   Upper Extremity Assessment Upper Extremity Assessment: Overall WFL for tasks assessed  Lower Extremity Assessment Lower Extremity Assessment: LLE deficits/detail LLE Deficits / Details: grossly 4-/5 LLE    Cervical / Trunk Assessment Cervical / Trunk Assessment: Normal  Communication   Communication: No difficulties  Cognition Arousal/Alertness:  Awake/alert Behavior During Therapy: WFL for tasks assessed/performed Overall Cognitive Status: Within Functional Limits for tasks assessed                                        General Comments General comments (skin integrity, edema, etc.): VSS on RA    Exercises     Assessment/Plan    PT Assessment Patient needs continued PT services  PT Problem List Decreased strength;Decreased activity tolerance;Decreased balance;Decreased mobility       PT Treatment Interventions DME instruction;Gait training;Therapeutic activities;Functional mobility training;Neuromuscular re-education;Balance training;Therapeutic exercise;Patient/family education    PT Goals (Current goals can be found in the Care Plan section)  Acute Rehab PT Goals Patient Stated Goal: to return to prior level of function and to not fall again PT Goal Formulation: With patient Time For Goal Achievement: 02/28/20 Potential to Achieve Goals: Good    Frequency Min 3X/week   Barriers to discharge        Co-evaluation               AM-PAC PT "6 Clicks" Mobility  Outcome Measure Help needed turning from your back to your side while in a flat bed without using bedrails?: A Little Help needed moving from lying on your back to sitting on the side of a flat bed without using bedrails?: A Little Help needed moving to and from a bed to a chair (including a wheelchair)?: A Little Help needed standing up from a chair using your arms (e.g., wheelchair or bedside chair)?: A Little Help needed to walk in hospital room?: A Little Help needed climbing 3-5 steps with a railing? : A Lot 6 Click Score: 17    End of Session   Activity Tolerance: Patient tolerated treatment well Patient left: in bed;with call bell/phone within reach;with bed alarm set;with family/visitor present Nurse Communication: Mobility status PT Visit Diagnosis: Other abnormalities of gait and mobility (R26.89);Muscle weakness  (generalized) (M62.81);History of falling (Z91.81)    Time: 2956-2130 PT Time Calculation (min) (ACUTE ONLY): 20 min   Charges:   PT Evaluation $PT Eval Low Complexity: Sher[place], PT, DPT Acute Rehabilitation Pager: [phone]   Zenaida Niece 02/14/2020, 5:51 PM

## 2020-02-14 NOTE — Progress Notes (Addendum)
PROGRESS NOTE    Maxwell Fox   YQM:[id number]  DOB: [birth year]  DOA: 02/14/2020 PCP: [name], MD   Brief Narrative:  Maxwell Fox is a 75 year old male with gastric bypass, melanoma, chronic adrenal insufficiency, recent fall with pelvic fractures being treated with NSAIDs who presents for black stools and severe weakness.  He states that he initially felt weak about 3 days ago.  The next day he began feeling palpitations and then he realized his stool was black. In the ED, hemoglobin was noted to be 7.7 and was 12.3 when last checked in 01/17/2020. 1 unit of packed red blood cells was ordered and a GI consult was requested.   Subjective: Feeling weak and having pelvic pain.    Assessment & Plan:   Principal Problem:   Acute GI bleeding melena-history of of Roux-en-Y anastomosis - Status post EGD today which revealed 2 marginal ulcers at the area of anastomosis grade B reflux esophagitis - Continue PPI twice daily IV for now, full liquid diet today- advance tomorrow if no bleeding  Active Problems: Acute blood loss anemia-symptomatic -check Hemoglobin after transfusion complete and transfuse further if needed    Closed fracture of left superior pubic ramus  -X-ray imaging from 12/15 also suggested that the left inferior pubic ramus was also fractured -Increase hydrocodone to 1-2 tabs every 4 hours as needed-IV fentanyl being given for breakthrough pain -We will obtain another PT eval tomorrow  Hypothyroidism - Continue Synthroid  Chronic prednisone use - Continue prednisone at 5 mg daily  BPH - Continue Flomax  Depression and anxiety - Continue Lexapro and clonazepam  Time spent in minutes: 35 DVT prophylaxis: SCDs Start: 02/14/20 0631  Code Status: Full code Family Communication:  Disposition Plan:  Status is: Observation  The patient will require care spanning > 2 midnights and should be moved to inpatient because: IV treatments appropriate due to  intensity of illness or inability to take PO  Dispo: The patient is from: Home              Anticipated d/c is to: To be determined based on PT eval              Anticipated d/c date is: 2 days              Patient currently is not medically stable to d/c.      Consultants:   GI Procedures:   EGD Antimicrobials:  Anti-infectives (From admission, onward)   None       Objective: Vitals:   02/14/20 1048 02/14/20 1236 02/14/20 1301 02/14/20 1410  BP: (!) 104/56 114/60 (!) 106/56 (!) 94/42  Pulse: 77 73 76 83  Resp: 18 17 14  (!) 21  Temp:  98.6 F (37 C) 98.6 F (37 C) 97.7 F (36.5 C)  TempSrc:  Oral Oral Oral  SpO2: 100% 100% 100% 100%  Weight:      Height:        Intake/Output Summary (Last 24 hours) at 02/14/2020 1453 Last data filed at 02/14/2020 1414 Gross per 24 hour  Intake 200 ml  Output --  Net 200 ml   Filed Weights   02/14/20 0733  Weight: 88.5 kg    Examination: General exam: Appears comfortable  HEENT: PERRLA, oral mucosa moist, no sclera icterus or thrush Respiratory system: Clear to auscultation. Respiratory effort normal. Cardiovascular system: S1 & S2 heard, RRR.   Gastrointestinal system: Abdomen soft, non-tender, nondistended. Normal bowel sounds. Central nervous system: Alert and  oriented. No focal neurological deficits. Extremities: No cyanosis, clubbing or edema Skin: No rashes or ulcers Psychiatry:  Mood & affect appropriate.     Data Reviewed: I have personally reviewed following labs and imaging studies  CBC: Recent Labs  Lab 02/14/20 0257 02/14/20 0346 02/14/20 0928  WBC 9.2  --  9.5  NEUTROABS 4.4  --   --   HGB 7.7* 7.1* 7.0*  HCT 24.5* 21.0* 22.3*  MCV 94.6  --  94.9  PLT 194  --  [id number]   Basic Metabolic Panel: Recent Labs  Lab 02/14/20 0257 02/14/20 0346  NA 139 139  K 4.5 4.8  CL 110 107  CO2 19*  --   GLUCOSE 134* 106*  BUN 52* 57*  CREATININE 1.00 1.00  CALCIUM 8.4*  --    GFR: Estimated Creatinine  Clearance: 72.6 mL/min (by C-G formula based on SCr of 1 mg/dL). Liver Function Tests: Recent Labs  Lab 02/14/20 0257  AST 25  ALT 17  ALKPHOS 129*  BILITOT 0.2*  PROT 4.8*  ALBUMIN 2.6*   No results for input(s): LIPASE, AMYLASE in the last 168 hours. No results for input(s): AMMONIA in the last 168 hours. Coagulation Profile: Recent Labs  Lab 02/14/20 0257  INR 1.1   Cardiac Enzymes: No results for input(s): CKTOTAL, CKMB, CKMBINDEX, TROPONINI in the last 168 hours. BNP (last 3 results) No results for input(s): PROBNP in the last 8760 hours. HbA1C: No results for input(s): HGBA1C in the last 72 hours. CBG: Recent Labs  Lab 02/14/20 0902 02/14/20 1227  GLUCAP 106* 105*   Lipid Profile: No results for input(s): CHOL, HDL, LDLCALC, TRIG, CHOLHDL, LDLDIRECT in the last 72 hours. Thyroid Function Tests: No results for input(s): TSH, T4TOTAL, FREET4, T3FREE, THYROIDAB in the last 72 hours. Anemia Panel: No results for input(s): VITAMINB12, FOLATE, FERRITIN, TIBC, IRON, RETICCTPCT in the last 72 hours. Urine analysis:    Component Value Date/Time   COLORURINE YELLOW 01/17/2020 1005   APPEARANCEUR CLEAR 01/17/2020 1005   LABSPEC 1.012 01/17/2020 1005   PHURINE 5.0 01/17/2020 1005   GLUCOSEU NEGATIVE 01/17/2020 1005   HGBUR MODERATE (A) 01/17/2020 1005   BILIRUBINUR NEGATIVE 01/17/2020 1005   KETONESUR NEGATIVE 01/17/2020 1005   PROTEINUR NEGATIVE 01/17/2020 1005   NITRITE NEGATIVE 01/17/2020 1005   LEUKOCYTESUR NEGATIVE 01/17/2020 1005   Sepsis Labs: @LABRCNTIP (procalcitonin:4,lacticidven:4) ) Recent Results (from the past 240 hour(s))  Resp Panel by RT-PCR (Flu A&B, Covid) Nasopharyngeal Swab     Status: None   Collection Time: 02/14/20  3:52 AM   Specimen: Nasopharyngeal Swab; Nasopharyngeal(NP) swabs in vial transport medium  Result Value Ref Range Status   SARS Coronavirus 2 by RT PCR NEGATIVE NEGATIVE Final    Comment: (NOTE) SARS-CoV-2 target nucleic  acids are NOT DETECTED.  The SARS-CoV-2 RNA is generally detectable in upper respiratory specimens during the acute phase of infection. The lowest concentration of SARS-CoV-2 viral copies this assay can detect is 138 copies/mL. A negative result does not preclude SARS-Cov-2 infection and should not be used as the sole basis for treatment or other patient management decisions. A negative result may occur with  improper specimen collection/handling, submission of specimen other than nasopharyngeal swab, presence of viral mutation(s) within the areas targeted by this assay, and inadequate number of viral copies(<138 copies/mL). A negative result must be combined with clinical observations, patient history, and epidemiological information. The expected result is Negative.  Fact Sheet for Patients:  EntrepreneurPulse.com.au  Fact Sheet for Healthcare Providers:  IncredibleEmployment.be  This test is no t yet approved or cleared by the [place] and  has been authorized for detection and/or diagnosis of SARS-CoV-2 by FDA under an Emergency Use Authorization (EUA). This EUA will remain  in effect (meaning this test can be used) for the duration of the COVID-19 declaration under Section 564(b)(1) of the Act, 21 U.S.C.section 360bbb-3(b)(1), unless the authorization is terminated  or revoked sooner.       Influenza A by PCR NEGATIVE NEGATIVE Final   Influenza B by PCR NEGATIVE NEGATIVE Final    Comment: (NOTE) The Xpert Xpress SARS-CoV-2/FLU/RSV plus assay is intended as an aid in the diagnosis of influenza from Nasopharyngeal swab specimens and should not be used as a sole basis for treatment. Nasal washings and aspirates are unacceptable for Xpert Xpress SARS-CoV-2/FLU/RSV testing.  Fact Sheet for Patients: EntrepreneurPulse.com.au  Fact Sheet for Healthcare Providers: IncredibleEmployment.be  This  test is not yet approved or cleared by the [place] FDA and has been authorized for detection and/or diagnosis of SARS-CoV-2 by FDA under an Emergency Use Authorization (EUA). This EUA will remain in effect (meaning this test can be used) for the duration of the COVID-19 declaration under Section 564(b)(1) of the Act, 21 U.S.C. section 360bbb-3(b)(1), unless the authorization is terminated or revoked.  Performed at [facility], [place] [address] [place], [place] [address]          Radiology Studies: DG Pelvis Portable  Result Date: 02/14/2020 CLINICAL DATA:  Left hip/pelvic pain.  History of fall. EXAM: PORTABLE PELVIS 1-2 VIEWS COMPARISON:  01/17/2020. FINDINGS: Left superior pubic ramus fracture again noted. Fracture of the left inferior pubic ramus is also most likely present. Hips are intact. Diffuse osteopenia. Degenerative change lumbar spine and both hips. IMPRESSION: Left superior pubic ramus fracture again noted. Fracture of the left inferior pubic ramus is also most likely present. No evidence of hip fracture. Electronically Signed   By: Marcello Moores  Register   On: 02/14/2020 06:08   DG Chest Port 1 View  Result Date: 02/14/2020 CLINICAL DATA:  Rectal bleeding, fall 3 weeks prior EXAM: PORTABLE CHEST 1 VIEW COMPARISON:  01/17/2020 FINDINGS: No consolidation, features of edema, pneumothorax, or effusion. Pulmonary vascularity is normally distributed. The aorta is calcified. The remaining cardiomediastinal contours are unremarkable. No acute osseous or soft tissue abnormality. Degenerative changes are present in the imaged spine and left shoulder. Prior right shoulder arthroplasty. Surgical clips project over the bilateral axilla and in the upper abdomen. Telemetry leads overlie the chest. IMPRESSION: No acute cardiopulmonary abnormality. Aortic Atherosclerosis (ICD10-I70.0). Electronically Signed   By: Lovena Le M.D.   On: 02/14/2020 03:21      Scheduled Meds: . [MAR  Hold] clonazePAM  1 mg Oral Daily  . [MAR Hold] escitalopram  10 mg Oral QHS  . [MAR Hold] levothyroxine  44 mcg Intravenous Daily  . [MAR Hold] predniSONE  5 mg Oral Q breakfast  . [MAR Hold] tamsulosin  0.4 mg Oral QPC supper   Continuous Infusions: . sodium chloride 75 mL/hr at 02/14/20 1347  . pantoprozole (PROTONIX) infusion 8 mg/hr (02/14/20 1347)     LOS: 0 days      Debbe Odea, MD [facility] Pager: [email or web address] 02/14/2020, 2:53 PM

## 2020-02-14 NOTE — ED Notes (Signed)
Please call pt daughter [name] with any changes or updates. Phone number in chart.

## 2020-02-14 NOTE — Op Note (Signed)
[facility] Patient Name: Maxwell Fox Procedure Date : 02/14/2020 MRN: [id number] Attending MD: Gerrit Heck , MD Date of Birth: 07-17-1945 CSN: [id number] Age: 75 Admit Type: Inpatient Procedure:                Upper GI endoscopy Indications:              Acute post hemorrhagic anemia, Melena Providers:                Gerrit Heck, MD, Clyde Lundborg, RN, Benetta Spar, Technician Referring MD:              Medicines:                Monitored Anesthesia Care Complications:            No immediate complications. Estimated Blood Loss:     Estimated blood loss was minimal. Procedure:                Pre-Anesthesia Assessment:                           - Prior to the procedure, a History and Physical                            was performed, and patient medications and                            allergies were reviewed. The patient's tolerance of                            previous anesthesia was also reviewed. The risks                            and benefits of the procedure and the sedation                            options and risks were discussed with the patient.                            All questions were answered, and informed consent                            was obtained. Prior Anticoagulants: The patient has                            taken no previous anticoagulant or antiplatelet                            agents. ASA Grade Assessment: III - A patient with                            severe systemic disease. After reviewing the risks  and benefits, the patient was deemed in                            satisfactory condition to undergo the procedure.                           After obtaining informed consent, the endoscope was                            passed under direct vision. Throughout the                            procedure, the patient's blood pressure, pulse, and                             oxygen saturations were monitored continuously. The                            [id number] Olympus gastroscope was                            introduced through the mouth, and advanced to the                            jejunum. The upper GI endoscopy was accomplished                            without difficulty. The patient tolerated the                            procedure well. Scope In: Scope Out: Findings:      LA Grade B (one or more mucosal breaks greater than 5 mm, not extending       between the tops of two mucosal folds) esophagitis with no bleeding was       found in the lower third of the esophagus. The upper and middle       esophagus were otherwise normal.      Evidence of a Roux-en-Y gastrojejunostomy was found. The gastrojejunal       anastomosis was characterized by 2 deep marginal ulcers. There were       visible staples and 1 suture noted. There was no active bleeding and no       high grade stigmata that required endoscopic intervention. The       anastamosis was easily traversed.      The examined jejunum was normal. Impression:               - LA Grade B reflux esophagitis with no bleeding.                           - Roux-en-Y gastrojejunostomy with gastrojejunal                            anastomosis characterized by marginal ulcers x2  along with a few surgical staples and at least 1                            visible suture.                           - Normal examined jejunum.                           - No specimens collected. Recommendation:           - Return patient to hospital ward for ongoing care.                           - Full liquid diet today.                           - Continue present medications.                           - Use Protonix (pantoprazole) 40 mg IV BID for 24                            hours, then change to Protonix 40 mg PO BID for at                            least 8 weeks.                            - Use sucralfate suspension 1 gram PO QID for 6                            weeks.                           - Repeat upper endoscopy in 8 weeks to check                            healing. If ulcers are healing, will plan on suture                            removal at that time.                           - NO NSAIDS.                           - Continue serial Hgb/Hct checks with additional                            blood products as needed per protocol. Procedure Code(s):        --- Professional ---                           [phone], Esophagogastroduodenoscopy, flexible,  transoral; diagnostic, including collection of                            specimen(s) by brushing or washing, when performed                            (separate procedure) Diagnosis Code(s):        --- Professional ---                           K21.00, Gastro-esophageal reflux disease with                            esophagitis, without bleeding                           Z98.0, Intestinal bypass and anastomosis status                           D62, Acute posthemorrhagic anemia                           K92.1, Melena (includes Hematochezia) CPT copyright 2019 American Medical Association. All rights reserved. The codes documented in this report are preliminary and upon coder review may  be revised to meet current compliance requirements. Gerrit Heck, MD 02/14/2020 2:18:55 PM Number of Addenda: 0

## 2020-02-14 NOTE — Interval H&P Note (Signed)
History and Physical Interval Note:  02/14/2020 1:49 PM  Maxwell Fox  has presented today for surgery, with the diagnosis of FOBT positive stool/melena.  Anemia.  The various methods of treatment have been discussed with the patient and family. After consideration of risks, benefits and other options for treatment, the patient has consented to  Procedure(s): ESOPHAGOGASTRODUODENOSCOPY (EGD) WITH PROPOFOL (N/A) as a surgical intervention.  The patient's history has been reviewed, patient examined, no change in status, stable for surgery.  I have reviewed the patient's chart and labs.  Questions were answered to the patient's satisfaction.     [name]

## 2020-02-14 NOTE — ED Notes (Signed)
Spoke to daughter, [name] and provided update about blood administration and pt going to have Endo procedure around 1:42 for 2:45 appt. Family verbalized understanding.

## 2020-02-14 NOTE — Consult Note (Addendum)
[facility] Consult: 8:19 AM 02/14/2020  LOS: 0 days    Referring Provider: Dr Wynelle Cleveland Primary Care Physician:  [name], MD Primary Gastroenterologist:  Dr. Hilarie Fredrickson.   Oncologist at [facility] Dr Nicki Reaper Colin Rhein.  Dr. Raynelle Chary at [facility] [phone] Pt cell: [phone].      Reason for Consultation:  FOBT + normocytic anemia.     HPI: Maxwell Fox is a 75 y.o. male.  PMH  Malignant melanoma upper back excised 2011 w brain mets, craniotomy 2017, chest nodules 2017 s/p pembrolizumab 2018; tibial and brain mets w ipilimumab, nivolumab 2018.  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  Stable 2.2 x 2 cm IPMN and some smaller pancreatic lesions  per CT 11/2018.  Stable pancreatic cystic lesions, stable nodule L temp lobe per 09/2019 CTs.    adrenal insufficiency, on low-dose prednisone daily s/p bariatric R n Y gastric bypass.  B 12 def/malabsorption. Hypothyroidism, labs ok in 08/2019.   Deg spine disease.  LV EF 50% on 08/2018 Echo.  Diverticulosis per CT scans.   L pubic rami fx 01/17/2020, no surgery.  S/p R shoulder arthroplasty.   Seen at GI office by Dr. Hilarie Fredrickson in 02/2016 regarding the IPMN's.  He recommended surveillance with MRI.  EUS and FNA would be a challenge given his Roux-en-Y anatomy.   2013 Colonoscopy in [place], noted as normal.    Meds include Prednisone 5 mg/day.  No PPI etc.    2 to 3 d of black stool, progressive weakness.  So weak he was unable to stand up using walker and fell at home.  No nausea, vomiting, change in appetite.  No significant weight fluctuation.  No abdominal pain.  Does not use NSAIDs.  His only complaint is pain in the left hip.  Has that prior to his fall and hip fracture a month ago, he walked up to 5  miles daily and was quite active.  Hgb 7.7, was 12.6 one month ago.   BUN elevated to 57, previously normal Alk phos elevated 129 but O/w normal LFTs.   INR normal.    Family Hx:  Marland Kitchen Melanoma Mother  . Ovarian cancer Sister  . Breast cancer Sister  . Stomach cancer Sister  . Melanoma Brother  . Lung cancer Maternal Aunt   Social Hx Quit smoking 1974.  Retired Clinical biochemist.    Past Medical History:  Diagnosis Date  . B12 nutritional deficiency    malabsorbtion  . BPH (benign prostatic hypertrophy)   . Diverticulosis   . Hypertension   . Iron deficiency anemia    malabsorption related (s/p gastric bypass)  . Melanoma ([place])   . Obesity    s/p gastric bypass 2000, start 355#  . Pancreatic cyst   . Pulmonary nodules     Past Surgical History:  Procedure Laterality Date  . CRANIOTOMY FOR TUMOR  12/11/2015   metastatic melanoma  . MOHS SURGERY  2012   melanoma, mid back, [place]  .  ROUX-EN-Y PROCEDURE  2000  . TOTAL SHOULDER REPLACEMENT Right 2011   [place]    Prior to Admission medications   Medication Sig Start Date End Date Taking? Authorizing Provider  calcium carbonate (OS-CAL - DOSED IN MG OF ELEMENTAL CALCIUM) 1250 (500 Ca) MG tablet Take 1 tablet by mouth every evening.   Yes [provider]  Cholecalciferol (VITAMIN D) 2000 UNITS tablet Take 1 tablet (2,000 Units total) by mouth daily. 10/26/13  Yes [name], MD  clonazePAM (KLONOPIN) 1 MG tablet Take 1 tablet (1 mg total) by mouth daily as needed. Patient taking differently: Take 1 mg by mouth daily as needed for anxiety. 01/22/20  Yes [name], [name], MD  escitalopram (LEXAPRO) 10 MG tablet TAKE 1 TABLET(10 MG) BY MOUTH DAILY Patient taking differently: Take 10 mg by mouth at bedtime. 11/23/19  Yes Burns, [name], MD  HYDROcodone-acetaminophen (NORCO/VICODIN) 5-325 MG tablet Take 1 tablet by mouth every 4 (four) hours as needed for moderate pain. 02/05/20  Yes [provider]   levothyroxine (SYNTHROID) 88 MCG tablet Take 1 tablet (88 mcg total) by mouth daily before breakfast. 06/26/19  Yes [name], MD  Multiple Vitamins-Minerals (MULTIVITAMIN ADULTS) TABS Take 1 tablet by mouth every evening.   Yes [provider]  predniSONE (DELTASONE) 5 MG tablet Take 1 tablet (5 mg total) by mouth daily with breakfast. 06/26/19  Yes [name], MD  tamsulosin (FLOMAX) 0.4 MG CAPS capsule TAKE 1 CAPSULE(0.4 MG) BY MOUTH DAILY AFTER SUPPER Patient taking differently: Take 0.4 mg by mouth daily after supper. 11/30/19  Yes Burns, [name], MD    Scheduled Meds: . clonazePAM  1 mg Oral Daily  . escitalopram  10 mg Oral QHS  . levothyroxine  44 mcg Intravenous Daily  . predniSONE  5 mg Oral Q breakfast  . tamsulosin  0.4 mg Oral QPC supper   Infusions: . sodium chloride    . sodium chloride    . pantoprozole (PROTONIX) infusion 8 mg/hr (02/14/20 0541)   PRN Meds: acetaminophen **OR** acetaminophen, fentaNYL (SUBLIMAZE) injection, HYDROcodone-acetaminophen   Allergies as of 02/14/2020  . (No Known Allergies)    Family History  Problem Relation Age of Onset  . Hypertension Father   . Macular degeneration Mother   . Melanoma Mother   . Melanoma Sister   . Breast cancer Sister   . Uterine cancer Sister   . Stomach cancer Sister   . Melanoma Brother     Social History   Socioeconomic History  . Marital status: Married    Spouse name: Not on file  . Number of children: 2  . Years of education: Not on file  . Highest education level: Not on file  Occupational History  . Not on file  Tobacco Use  . Smoking status: Former Smoker    Quit date: 02/03/1963    Years since quitting: 57.0  . Smokeless tobacco: Never Used  Substance and Sexual Activity  . Alcohol use: Not on file    Comment: wine 2x/week  . Drug use: Not on file  . Sexual activity: Not on file  Other Topics Concern  . 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  No angina GU:  No hematuria, no frequency.  No hesitancy. GI: See HPI Heme: Denies unusual or excessive bleeding or bruising Transfusions: None Neuro:  No headaches, no peripheral tingling or numbness.  No syncope, no seizures Derm:  No itching, no rash or sores.  Endocrine:  No sweats or chills.  No polyuria or dysuria Immunization: COVID vaccinated x3.   PHYSICAL EXAM: Vital signs in last 24 hours: Vitals:   02/14/20 0751 02/14/20 0817  BP:  121/71  Pulse:  82  Resp:  20  Temp: 98.3 F (36.8 C)   SpO2:  100%   Wt Readings from Last 3 Encounters:  02/14/20 88.5 kg  01/17/20 88 kg  08/18/19 88.9 kg    General: Pale, looks moderately chronically ill.  Alert, comfortable on stretcher Head: No facial asymmetry or swelling.  No signs of head trauma. Eyes: No scleral icterus or conjunctival pallor.  EOMI Ears: Not hard of hearing Nose: No congestion or discharge Mouth: Dentition.  Tongue midline.  Mucosa pink, moist, clear. Neck: No JVD, no masses, no thyromegaly Lungs: No labored breathing or cough Heart: RRR. Abdomen: Soft, not tender, not distended.  Well-healed long upper midline scar consistent with gastric bypass.  No HSM, masses, bruits, hernias.   Rectal: Not repeated, black, FOBT positive stool per ED staff. Musc/Skeltl: No joint redness, swelling or gross deformity.  Large scar consistent with replacement surgery on right shoulder. Extremities: No  CCE. Neurologic: Oriented x3.  Good historian.  No tremors, no gross weakness, strength not tested though. Skin: No rash, no sores, no telangiectasia. Tattoos: None noted Nodes: No cervical adenopathy Psych: Calm, pleasant, cooperative, fluid speech.  Intake/Output from previous day: No intake/output data recorded. Intake/Output this shift: No intake/output data recorded.  LAB RESULTS: Recent Labs    02/14/20 0257 02/14/20 0346  WBC 9.2  --   HGB 7.7* 7.1*  HCT 24.5* 21.0*  PLT 194  --    BMET Lab Results  Component Value Date   NA 139 02/14/2020   NA 139 02/14/2020   NA 143 01/17/2020   K 4.8 02/14/2020   K 4.5 02/14/2020   K 4.1 01/17/2020   CL 107 02/14/2020   CL 110 02/14/2020   CL 107 01/17/2020   CO2 19 (L) 02/14/2020   CO2 28 01/17/2020   CO2 26 08/16/2018   GLUCOSE 106 (H) 02/14/2020   GLUCOSE 134 (H) 02/14/2020   GLUCOSE 86 01/17/2020   BUN 57 (H) 02/14/2020   BUN 52 (H) 02/14/2020   BUN 18 01/17/2020   CREATININE 1.00 02/14/2020   CREATININE 1.00 02/14/2020   CREATININE 0.76 01/17/2020   CALCIUM 8.4 (L) 02/14/2020   CALCIUM 9.3 01/17/2020   CALCIUM 9.0 08/16/2018   LFT Recent Labs    02/14/20 0257  PROT 4.8*  ALBUMIN 2.6*  AST 25  ALT 17  ALKPHOS 129*  BILITOT 0.2*   PT/INR Lab Results  Component Value Date   INR 1.1 02/14/2020   Hepatitis Panel No results for input(s): HEPBSAG, HCVAB, HEPAIGM, HEPBIGM in the last 72 hours. C-Diff No components found for: CDIFF Lipase  No results found for: LIPASE  Drugs of Abuse  No results found for:  LABOPIA, COCAINSCRNUR, LABBENZ, AMPHETMU, THCU, LABBARB   RADIOLOGY STUDIES: DG Pelvis Portable  Result Date: 02/14/2020 CLINICAL DATA:  Left hip/pelvic pain.  History of fall. EXAM: PORTABLE PELVIS 1-2 VIEWS COMPARISON:  01/17/2020. FINDINGS: Left superior pubic ramus fracture again noted. Fracture of the left inferior pubic ramus is also most likely present. Hips are intact. Diffuse osteopenia.  Degenerative change lumbar spine and both hips. IMPRESSION: Left superior pubic ramus fracture again noted. Fracture of the left inferior pubic ramus is also most likely present. No evidence of hip fracture. Electronically Signed   By: Marcello Moores  Register   On: 02/14/2020 06:08   DG Chest Port 1 View  Result Date: 02/14/2020 CLINICAL DATA:  Rectal bleeding, fall 3 weeks prior EXAM: PORTABLE CHEST 1 VIEW COMPARISON:  01/17/2020 FINDINGS: No consolidation, features of edema, pneumothorax, or effusion. Pulmonary vascularity is normally distributed. The aorta is calcified. The remaining cardiomediastinal contours are unremarkable. No acute osseous or soft tissue abnormality. Degenerative changes are present in the imaged spine and left shoulder. Prior right shoulder arthroplasty. Surgical clips project over the bilateral axilla and in the upper abdomen. Telemetry leads overlie the chest. IMPRESSION: No acute cardiopulmonary abnormality. Aortic Atherosclerosis (ICD10-I70.0). Electronically Signed   By: Lovena Le M.D.   On: 02/14/2020 03:21      IMPRESSION:    *   FOBT +. Normocytic anemia.  Hgb 12.3 >> 7.1 over 1 month.    *   Malignant melenoma.  Previous mets to brain.    *   Elevated BUN,  Normal creat.    *   Pelvic fracture.  Xray w known L superior pubic rami fx, and likely L inferior rami fx.    *   Elevated alk phos, O/w normal LFTs.  Not surprising in setting of fracture.   Stable pancreatic IPMNs per imaging at [facility] in last few years.  ? of fistula between bypassed stomach to jejunal Roux limb on CT of 02/2018.  No fistula seen on subsequent CTs later that year.  *   S/p gastric bypass 2000  *   Hx Melanoma.      PLAN:     *  EGD today.   Continue PPI drip and NPO.  CBC this afternoon.     [name]  02/14/2020, 8:19 AM Phone [phone]

## 2020-02-14 NOTE — Transfer of Care (Signed)
Immediate Anesthesia Transfer of Care Note  Patient: Finlee Milo  Procedure(s) Performed: ESOPHAGOGASTRODUODENOSCOPY (EGD) WITH PROPOFOL (N/A )  Patient Location: Endoscopy Unit  Anesthesia Type:MAC  Level of Consciousness: awake, alert , oriented and patient cooperative  Airway & Oxygen Therapy: Patient Spontanous Breathing and Patient connected to nasal cannula oxygen  Post-op Assessment: Report given to RN and Post -op Vital signs reviewed and stable  Post vital signs: Reviewed and stable  Last Vitals:  Vitals Value Taken Time  BP 94/42 02/14/20 1413  Temp    Pulse 80 02/14/20 1413  Resp 22 02/14/20 1413  SpO2 100 % 02/14/20 1413  Vitals shown include unvalidated device data.  Last Pain:  Vitals:   02/14/20 1301  TempSrc: Oral  PainSc: 5          Complications: No complications documented.

## 2020-02-14 NOTE — Anesthesia Preprocedure Evaluation (Addendum)
Anesthesia Evaluation  Patient identified by MRN, date of birth, ID band Patient awake    Reviewed: Allergy & Precautions, NPO status , Patient's Chart, lab work & pertinent test results  Airway Mallampati: II  TM Distance: >3 FB Neck ROM: Full    Dental no notable dental hx.    Pulmonary former smoker,    Pulmonary exam normal breath sounds clear to auscultation       Cardiovascular hypertension, +CHF (grade 1 diastolic dysfunction)  Normal cardiovascular exam Rhythm:Regular Rate:Normal  Echo 2020: mild MR, grade 1 diastolic dysfunction, normal LVEF   Neuro/Psych PSYCHIATRIC DISORDERS Anxiety Depression negative neurological ROS     GI/Hepatic Neg liver ROS, S/p gastric bypass 2000   Endo/Other  Hypothyroidism   Renal/GU negative Renal ROS  negative genitourinary   Musculoskeletal negative musculoskeletal ROS (+)   Abdominal   Peds  Hematology  (+) Blood dyscrasia, anemia , H/H 7/22.3   Anesthesia Other Findings   Reproductive/Obstetrics negative OB ROS                            Anesthesia Physical Anesthesia Plan  ASA: III  Anesthesia Plan: MAC   Post-op Pain Management:    Induction:   PONV Risk Score and Plan: 2 and Propofol infusion and TIVA  Airway Management Planned: Natural Airway and Simple Face Mask  Additional Equipment: None  Intra-op Plan:   Post-operative Plan:   Informed Consent: I have reviewed the patients History and Physical, chart, labs and discussed the procedure including the risks, benefits and alternatives for the proposed anesthesia with the patient or authorized representative who has indicated his/her understanding and acceptance.       Plan Discussed with: CRNA  Anesthesia Plan Comments:         Anesthesia Quick Evaluation

## 2020-02-14 NOTE — Anesthesia Postprocedure Evaluation (Signed)
Anesthesia Post Note  Patient: Maxwell Fox  Procedure(s) Performed: ESOPHAGOGASTRODUODENOSCOPY (EGD) WITH PROPOFOL (N/A )     Patient location during evaluation: PACU Anesthesia Type: MAC Level of consciousness: awake and alert Pain management: pain level controlled Vital Signs Assessment: post-procedure vital signs reviewed and stable Respiratory status: spontaneous breathing, nonlabored ventilation and respiratory function stable Cardiovascular status: blood pressure returned to baseline and stable Postop Assessment: no apparent nausea or vomiting Anesthetic complications: no   No complications documented.  Last Vitals:  Vitals:   02/14/20 1455 02/14/20 1505  BP: (!) 103/55 (!) 109/42  Pulse: 73 73  Resp: 17 17  Temp:    SpO2: 100% 99%    Last Pain:  Vitals:   02/14/20 1410  TempSrc: Oral  PainSc: [name]

## 2020-02-14 NOTE — ED Triage Notes (Signed)
Pt BIB [facility] for rectal bleeding. EMS reports that pt fell and broke his hip about 3 weeks ago. Pt got up to use bathroom tonight and fell and saw lots of blood and black tarry stools - EMS reports all over room. Pt was 75% on RA - EMS placed on NRB.  Pt has 18 LAC got 500 bolus in EMS.  BP 120/90 (up from original 80 palpated). Pt is AO.

## 2020-02-14 NOTE — ED Notes (Signed)
Pt daughter at bedside

## 2020-02-14 NOTE — ED Notes (Signed)
Blood consent signed and at bedside  

## 2020-02-14 NOTE — H&P (Signed)
History and Physical    Maxwell Fox [id number] DOB: 1946/01/08 DOA: 02/14/2020  PCP: [name], MD   Patient coming from: Home.  Chief Complaint: Fall.  HPI: Maxwell Fox is a 75 y.o. male with history of melanoma presently under remission previous history of gastric bypass in 2000 who was recently admitted for pelvic fracture was recently taking NSAIDs for pain has been experiencing melanotic stool for the last 3 days and weakness.  Tonight when patient was trying to go to the bathroom he felt so weak and he fell onto the floor did not hit his head or lose consciousness.  Did not have any chest pain.  ED Course: In the ER patient was mildly hypotensive responded to fluid.  Hemoglobin is around 7.7 and a drop of almost 5 g from recent past.  Stool for occult blood is positive.  Patient also was complaining of left pelvic area pain which shows most likely a new fracture on the left inferior pubic ramus in addition to the old fracture in the superior pubic ramus on the left side.  EKG shows normal sinus rhythm.  Patient started on 1 unit of PRBC transfusion and Protonix infusion and [facility] was consulted.  COVID test is negative.  Review of Systems: As per HPI, rest all negative.   Past Medical History:  Diagnosis Date  . B12 nutritional deficiency    malabsorbtion  . BPH (benign prostatic hypertrophy)   . Diverticulosis   . Hypertension   . Iron deficiency anemia    malabsorption related (s/p gastric bypass)  . Melanoma ([place])   . Obesity    s/p gastric bypass 2000, start 355#  . Pancreatic cyst   . Pulmonary nodules     Past Surgical History:  Procedure Laterality Date  . CRANIOTOMY FOR TUMOR  12/11/2015   metastatic melanoma  . MOHS SURGERY  2012   melanoma, mid back, [place]  . ROUX-EN-Y PROCEDURE  2000  . TOTAL SHOULDER REPLACEMENT Right 2011   [place]     reports that he quit smoking about 57 years ago. He has never used smokeless tobacco. No  history on file for alcohol use and drug use.  No Known Allergies  Family History  Problem Relation Age of Onset  . Hypertension Father   . Macular degeneration Mother   . Melanoma Mother   . Melanoma Sister   . Breast cancer Sister   . Uterine cancer Sister   . Stomach cancer Sister   . Melanoma Brother     Prior to Admission medications   Medication Sig Start Date End Date Taking? Authorizing Provider  calcium carbonate (OS-CAL - DOSED IN MG OF ELEMENTAL CALCIUM) 1250 (500 Ca) MG tablet Take 1 tablet by mouth every evening.   Yes [provider]  Cholecalciferol (VITAMIN D) 2000 UNITS tablet Take 1 tablet (2,000 Units total) by mouth daily. 10/26/13  Yes [name], MD  clonazePAM (KLONOPIN) 1 MG tablet Take 1 tablet (1 mg total) by mouth daily as needed. Patient taking differently: Take 1 mg by mouth daily as needed for anxiety. 01/22/20  Yes [name], [name], MD  escitalopram (LEXAPRO) 10 MG tablet TAKE 1 TABLET(10 MG) BY MOUTH DAILY Patient taking differently: Take 10 mg by mouth at bedtime. 11/23/19  Yes Burns, [name], MD  HYDROcodone-acetaminophen (NORCO/VICODIN) 5-325 MG tablet Take 1 tablet by mouth every 4 (four) hours as needed for moderate pain. 02/05/20  Yes [provider]  levothyroxine (SYNTHROID) 88  MCG tablet Take 1 tablet (88 mcg total) by mouth daily before breakfast. 06/26/19  Yes [name], MD  Multiple Vitamins-Minerals (MULTIVITAMIN ADULTS) TABS Take 1 tablet by mouth every evening.   Yes [provider]  predniSONE (DELTASONE) 5 MG tablet Take 1 tablet (5 mg total) by mouth daily with breakfast. 06/26/19  Yes [name], MD  tamsulosin (FLOMAX) 0.4 MG CAPS capsule TAKE 1 CAPSULE(0.4 MG) BY MOUTH DAILY AFTER SUPPER Patient taking differently: Take 0.4 mg by mouth daily after supper. 11/30/19  Yes [name], MD    Physical Exam: Constitutional: Moderately built and nourished. Vitals:   02/14/20 0430 02/14/20  0445 02/14/20 0500 02/14/20 0515  BP: 129/74 126/70 132/76 126/78  Pulse: 78 77 76 81  Resp: 14 13 14 14   Temp:      TempSrc:      SpO2: 100% 99% 100% 100%   Eyes: Anicteric no pallor. ENMT: No discharge from the ears eyes nose or mouth. Neck: No mass felt.  No neck rigidity. Respiratory: No rhonchi or crepitations. Cardiovascular: S1-S2 heard. Abdomen: Soft nontender bowel sounds present. Musculoskeletal: Pain on moving left hip. Skin: No rash. Neurologic: Alert awake oriented to time place and person.  Moves all extremities. Psychiatric: Appears normal.  Normal affect.   Labs on Admission: I have personally reviewed following labs and imaging studies  CBC: Recent Labs  Lab 02/14/20 0257 02/14/20 0346  WBC 9.2  --   NEUTROABS 4.4  --   HGB 7.7* 7.1*  HCT 24.5* 21.0*  MCV 94.6  --   PLT 194  --    Basic Metabolic Panel: Recent Labs  Lab 02/14/20 0257 02/14/20 0346  NA 139 139  K 4.5 4.8  CL 110 107  CO2 19*  --   GLUCOSE 134* 106*  BUN 52* 57*  CREATININE 1.00 1.00  CALCIUM 8.4*  --    GFR: CrCl cannot be calculated (Unknown ideal weight.). Liver Function Tests: Recent Labs  Lab 02/14/20 0257  AST 25  ALT 17  ALKPHOS 129*  BILITOT 0.2*  PROT 4.8*  ALBUMIN 2.6*   No results for input(s): LIPASE, AMYLASE in the last 168 hours. No results for input(s): AMMONIA in the last 168 hours. Coagulation Profile: Recent Labs  Lab 02/14/20 0257  INR 1.1   Cardiac Enzymes: No results for input(s): CKTOTAL, CKMB, CKMBINDEX, TROPONINI in the last 168 hours. BNP (last 3 results) No results for input(s): PROBNP in the last 8760 hours. HbA1C: No results for input(s): HGBA1C in the last 72 hours. CBG: No results for input(s): GLUCAP in the last 168 hours. Lipid Profile: No results for input(s): CHOL, HDL, LDLCALC, TRIG, CHOLHDL, LDLDIRECT in the last 72 hours. Thyroid Function Tests: No results for input(s): TSH, T4TOTAL, FREET4, T3FREE, THYROIDAB in the  last 72 hours. Anemia Panel: No results for input(s): VITAMINB12, FOLATE, FERRITIN, TIBC, IRON, RETICCTPCT in the last 72 hours. Urine analysis:    Component Value Date/Time   COLORURINE YELLOW 01/17/2020 1005   APPEARANCEUR CLEAR 01/17/2020 1005   LABSPEC 1.012 01/17/2020 1005   PHURINE 5.0 01/17/2020 1005   GLUCOSEU NEGATIVE 01/17/2020 1005   HGBUR MODERATE (A) 01/17/2020 1005   BILIRUBINUR NEGATIVE 01/17/2020 1005   KETONESUR NEGATIVE 01/17/2020 1005   PROTEINUR NEGATIVE 01/17/2020 1005   NITRITE NEGATIVE 01/17/2020 1005   LEUKOCYTESUR NEGATIVE 01/17/2020 1005   Sepsis Labs: @LABRCNTIP (procalcitonin:4,lacticidven:4) ) Recent Results (from the past 240 hour(s))  Resp Panel by RT-PCR (Flu A&B, Covid) Nasopharyngeal Swab  Status: None   Collection Time: 02/14/20  3:52 AM   Specimen: Nasopharyngeal Swab; Nasopharyngeal(NP) swabs in vial transport medium  Result Value Ref Range Status   SARS Coronavirus 2 by RT PCR NEGATIVE NEGATIVE Final    Comment: (NOTE) SARS-CoV-2 target nucleic acids are NOT DETECTED.  The SARS-CoV-2 RNA is generally detectable in upper respiratory specimens during the acute phase of infection. The lowest concentration of SARS-CoV-2 viral copies this assay can detect is 138 copies/mL. A negative result does not preclude SARS-Cov-2 infection and should not be used as the sole basis for treatment or other patient management decisions. A negative result may occur with  improper specimen collection/handling, submission of specimen other than nasopharyngeal swab, presence of viral mutation(s) within the areas targeted by this assay, and inadequate number of viral copies(<138 copies/mL). A negative result must be combined with clinical observations, patient history, and epidemiological information. The expected result is Negative.  Fact Sheet for Patients:  EntrepreneurPulse.com.au  Fact Sheet for Healthcare Providers:   IncredibleEmployment.be  This test is no t yet approved or cleared by the [place] FDA and  has been authorized for detection and/or diagnosis of SARS-CoV-2 by FDA under an Emergency Use Authorization (EUA). This EUA will remain  in effect (meaning this test can be used) for the duration of the COVID-19 declaration under Section 564(b)(1) of the Act, 21 U.S.C.section 360bbb-3(b)(1), unless the authorization is terminated  or revoked sooner.       Influenza A by PCR NEGATIVE NEGATIVE Final   Influenza B by PCR NEGATIVE NEGATIVE Final    Comment: (NOTE) The Xpert Xpress SARS-CoV-2/FLU/RSV plus assay is intended as an aid in the diagnosis of influenza from Nasopharyngeal swab specimens and should not be used as a sole basis for treatment. Nasal washings and aspirates are unacceptable for Xpert Xpress SARS-CoV-2/FLU/RSV testing.  Fact Sheet for Patients: EntrepreneurPulse.com.au  Fact Sheet for Healthcare Providers: IncredibleEmployment.be  This test is not yet approved or cleared by the [place] FDA and has been authorized for detection and/or diagnosis of SARS-CoV-2 by FDA under an Emergency Use Authorization (EUA). This EUA will remain in effect (meaning this test can be used) for the duration of the COVID-19 declaration under Section 564(b)(1) of the Act, 21 U.S.C. section 360bbb-3(b)(1), unless the authorization is terminated or revoked.  Performed at [facility], [place] [address], [place], [place] [address]      Radiological Exams on Admission: DG Pelvis Portable  Result Date: 02/14/2020 CLINICAL DATA:  Left hip/pelvic pain.  History of fall. EXAM: PORTABLE PELVIS 1-2 VIEWS COMPARISON:  01/17/2020. FINDINGS: Left superior pubic ramus fracture again noted. Fracture of the left inferior pubic ramus is also most likely present. Hips are intact. Diffuse osteopenia. Degenerative change lumbar spine and both  hips. IMPRESSION: Left superior pubic ramus fracture again noted. Fracture of the left inferior pubic ramus is also most likely present. No evidence of hip fracture. Electronically Signed   By: Marcello Moores  Register   On: 02/14/2020 06:08   DG Chest Port 1 View  Result Date: 02/14/2020 CLINICAL DATA:  Rectal bleeding, fall 3 weeks prior EXAM: PORTABLE CHEST 1 VIEW COMPARISON:  01/17/2020 FINDINGS: No consolidation, features of edema, pneumothorax, or effusion. Pulmonary vascularity is normally distributed. The aorta is calcified. The remaining cardiomediastinal contours are unremarkable. No acute osseous or soft tissue abnormality. Degenerative changes are present in the imaged spine and left shoulder. Prior right shoulder arthroplasty. Surgical clips project over the bilateral axilla and in the  upper abdomen. Telemetry leads overlie the chest. IMPRESSION: No acute cardiopulmonary abnormality. Aortic Atherosclerosis (ICD10-I70.0). Electronically Signed   By: Lovena Le M.D.   On: 02/14/2020 03:21    EKG: Independently reviewed.  Normal sinus rhythm.  Assessment/Plan Principal Problem:   Acute GI bleeding Active Problems:   Adrenal insufficiency due to cancer therapy ([place])   Closed fracture of left superior pubic ramus ([facility])   Acute blood loss anemia    1. Acute GI bleeding with recent use of NSAIDs and previous history of gastric bypass.  Patient has been placed on Protonix infusion.  Patient is receiving 1 unit of PRBC transfusion.  Cardiology has been consulted.  We will keep patient n.p.o. in anticipation of EGD. 2. Acute blood loss anemia follow CBC after transfusion. 3. History of adrenal insufficiency and secondary hypothyroidism for which patient is on stress dose IV steroids and Synthroid through IV. 4. Recent left superior ramus fracture with possible new fracture of the left inferior pubic ramus fracture.  May discuss with orthopedics in the morning. 5. History of malignant melanoma in  remission.   DVT prophylaxis: SCDs. Code Status: Full code. Family Communication: Discussed with patient. Disposition Plan: Home. Consults called: [facility]. Admission status: Observation.   Rise Patience MD [facility] Pager [phone].  If 7PM-7AM, please contact night-coverage [email or web address] Password [facility]  02/14/2020, 6:33 AM

## 2020-02-15 DIAGNOSIS — K922 Gastrointestinal hemorrhage, unspecified: Secondary | ICD-10-CM | POA: Diagnosis not present

## 2020-02-15 LAB — BASIC METABOLIC PANEL
Anion gap: 8 (ref 5–15)
BUN: 39 mg/dL — ABNORMAL HIGH (ref 8–23)
CO2: 20 mmol/L — ABNORMAL LOW (ref 22–32)
Calcium: 8 mg/dL — ABNORMAL LOW (ref 8.9–10.3)
Chloride: 110 mmol/L (ref 98–111)
Creatinine, Ser: 0.86 mg/dL (ref 0.61–1.24)
GFR, Estimated: >60 mL/min (ref >60)
Glucose, Bld: 87 mg/dL (ref 70–99)
Potassium: 3.9 mmol/L (ref 3.5–5.1)
Sodium: 138 mmol/L (ref 135–145)

## 2020-02-15 LAB — CBC
HCT: 21.5 % — ABNORMAL LOW (ref 39.0–52.0)
Hemoglobin: 7.2 g/dL — ABNORMAL LOW (ref 13.0–17.0)
MCH: 30.1 pg (ref 26.0–34.0)
MCHC: 33.5 g/dL (ref 30.0–36.0)
MCV: 90 fL (ref 80.0–100.0)
Platelets: 120 10*3/uL — ABNORMAL LOW (ref 150–400)
RBC: 2.39 MIL/uL — ABNORMAL LOW (ref 4.22–5.81)
RDW: 15.9 % — ABNORMAL HIGH (ref 11.5–15.5)
WBC: 8.2 10*3/uL (ref 4.0–10.5)
nRBC: 0 % (ref 0.0–0.2)

## 2020-02-15 LAB — PREPARE RBC (CROSSMATCH)

## 2020-02-15 MED ORDER — SODIUM CHLORIDE 0.9% IV SOLUTION: Freq: Once | INTRAVENOUS | Status: AC

## 2020-02-15 MED ORDER — PANTOPRAZOLE SODIUM 40 MG PO TBEC
40.0000 mg | DELAYED_RELEASE_TABLET | Freq: Two times a day (BID) | ORAL | Status: DC
  Administered 2020-02-15 – 2020-02-20 (×10): 40 mg via ORAL
  Filled 2020-02-15 (×10): qty 1

## 2020-02-15 MED ORDER — SODIUM CHLORIDE 0.9 % IV BOLUS
500.0000 mL | Freq: Once | INTRAVENOUS | Status: AC
  Administered 2020-02-15: 500 mL via INTRAVENOUS

## 2020-02-15 MED ORDER — SODIUM CHLORIDE 0.9 % IV SOLN
510.0000 mg | Freq: Once | INTRAVENOUS | Status: AC
  Administered 2020-02-15: 510 mg via INTRAVENOUS
  Filled 2020-02-15: qty 17

## 2020-02-15 MED ORDER — LEVOTHYROXINE SODIUM 88 MCG PO TABS
88.0000 ug | ORAL_TABLET | Freq: Every day | ORAL | Status: DC
  Administered 2020-02-16 – 2020-02-20 (×5): 88 ug via ORAL
  Filled 2020-02-15 (×5): qty 1

## 2020-02-15 NOTE — Progress Notes (Addendum)
PROGRESS NOTE    Maxwell Fox   [id number]  DOB: 02-Nov-1945  DOA: 02/14/2020 PCP: [name], MD   Brief Narrative:  Maxwell Fox is a 75 year old male with gastric bypass, melanoma, chronic adrenal insufficiency, recent fall with pelvic fractures being treated with NSAIDs who presents for black stools and severe weakness.  He states that he initially felt weak about 3 days ago.  The next day he began feeling palpitations and then he realized his stool was black. In the ED, hemoglobin was noted to be 7.7 and was 12.3 when last checked in 01/17/2020. 1 unit of packed red blood cells was ordered and a GI consult was requested.   Subjective: Very lightheaded this AM when asked to stand for a weight. We checked orthostatic vitals after giving him 500 cc of IVF and he was orthostatic again. We discussed giving 1 more unit of blood and he is in agreement with this.   Assessment & Plan:   Principal Problem:   Acute GI bleeding melena-history of of Roux-en-Y anastomosis - Status post EGD today which revealed 2 marginal ulcers at the area of anastomosis and LA grade B reflux esophagitis - no acute bleeding encountered on EGD  - Continue PPI twice daily IV for now   Active Problems: Acute blood loss anemia-symptomatic - Hemoglobin is 7.2 today- I do not think he is bleeding today but I do feel that he has lost a large volume of blood over the past few days- will transfuse 1 more unit of PRBC for symptomatic anemia - recheck Hb tomorrow AM and decide if he needs more blood    Closed fracture of left superior pubic ramus  -X-ray imaging from 12/15 also suggested that the left inferior pubic ramus was also fractured -Increased hydrocodone to 1-2 tabs every 4 hours as needed-IV fentanyl being given for breakthrough pain  - patient states he was told by his orthopedic surgeon to hold off on PT to allow his pelvis to heal and he has declined home health PT- will d/c his home health PT  orders  Hypothyroidism - Continue Synthroid  Chronic prednisone use - Continue prednisone at 5 mg daily  BPH - Continue Flomax  Depression and anxiety - Continue Lexapro and clonazepam  Time spent in minutes: 35 DVT prophylaxis: SCDs Start: 02/14/20 0631  Code Status: Full code Family Communication:  Disposition Plan:  Status is: Observation  The patient will require care spanning > 2 midnights and should be moved to inpatient because: IV treatments appropriate due to intensity of illness or inability to take PO  Dispo: The patient is from: Home              Anticipated d/c is to: home               Anticipated d/c date is:1- 2 days              Patient currently is not medically stable to d/c.      Consultants:   GI Procedures:   EGD Antimicrobials:  Anti-infectives (From admission, onward)   None       Objective: Vitals:   02/15/20 0949 02/15/20 1300 02/15/20 1400 02/15/20 1443  BP: 108/62 103/68 100/61 (!) 105/57  Pulse:      Resp:      Temp: 98.4 F (36.9 C)   98.4 F (36.9 C)  TempSrc: Oral   Oral  SpO2:      Weight:      Height:  Intake/Output Summary (Last 24 hours) at 02/15/2020 1450 Last data filed at 02/15/2020 1200 Gross per 24 hour  Intake 817 ml  Output 1450 ml  Net -633 ml   Filed Weights   02/14/20 0733 02/15/20 0421  Weight: 88.5 kg 86.4 kg    Examination: General exam: Appears comfortable  HEENT: PERRLA, oral mucosa moist, no sclera icterus or thrush Respiratory system: Clear to auscultation. Respiratory effort normal. Cardiovascular system: S1 & S2 heard,  No murmurs  Gastrointestinal system: Abdomen soft, non-tender, nondistended. Normal bowel sounds   Central nervous system: Alert and oriented. No focal neurological deficits. Extremities: No cyanosis, clubbing or edema Skin: No rashes or ulcers Psychiatry:  Mood & affect appropriate.     Data Reviewed: I have personally reviewed following labs and imaging  studies  CBC: Recent Labs  Lab 02/14/20 0257 02/14/20 0346 02/14/20 0928 02/14/20 1620 02/15/20 1005  WBC 9.2  --  9.5 8.0 8.2  NEUTROABS 4.4  --   --   --   --   HGB 7.7* 7.1* 7.0* 8.3* 7.2*  HCT 24.5* 21.0* 22.3* 24.7* 21.5*  MCV 94.6  --  94.9 89.2 90.0  PLT 194  --  168 155 829*   Basic Metabolic Panel: Recent Labs  Lab 02/14/20 0257 02/14/20 0346 02/15/20 0238  NA 139 139 138  K 4.5 4.8 3.9  CL 110 107 110  CO2 19*  --  20*  GLUCOSE 134* 106* 87  BUN 52* 57* 39*  CREATININE 1.00 1.00 0.86  CALCIUM 8.4*  --  8.0*   GFR: Estimated Creatinine Clearance: 77.8 mL/min (by C-G formula based on SCr of 0.86 mg/dL). Liver Function Tests: Recent Labs  Lab 02/14/20 0257  AST 25  ALT 17  ALKPHOS 129*  BILITOT 0.2*  PROT 4.8*  ALBUMIN 2.6*   No results for input(s): LIPASE, AMYLASE in the last 168 hours. No results for input(s): AMMONIA in the last 168 hours. Coagulation Profile: Recent Labs  Lab 02/14/20 0257  INR 1.1   Cardiac Enzymes: No results for input(s): CKTOTAL, CKMB, CKMBINDEX, TROPONINI in the last 168 hours. BNP (last 3 results) No results for input(s): PROBNP in the last 8760 hours. HbA1C: Recent Labs    02/14/20 1620  HGBA1C 4.7*   CBG: Recent Labs  Lab 02/14/20 0902 02/14/20 1227 02/14/20 2103  GLUCAP 106* 105* 83   Lipid Profile: No results for input(s): CHOL, HDL, LDLCALC, TRIG, CHOLHDL, LDLDIRECT in the last 72 hours. Thyroid Function Tests: No results for input(s): TSH, T4TOTAL, FREET4, T3FREE, THYROIDAB in the last 72 hours. Anemia Panel: No results for input(s): VITAMINB12, FOLATE, FERRITIN, TIBC, IRON, RETICCTPCT in the last 72 hours. Urine analysis:    Component Value Date/Time   COLORURINE YELLOW 01/17/2020 1005   APPEARANCEUR CLEAR 01/17/2020 1005   LABSPEC 1.012 01/17/2020 1005   PHURINE 5.0 01/17/2020 1005   GLUCOSEU NEGATIVE 01/17/2020 1005   HGBUR MODERATE (A) 01/17/2020 1005   BILIRUBINUR NEGATIVE 01/17/2020  1005   KETONESUR NEGATIVE 01/17/2020 1005   PROTEINUR NEGATIVE 01/17/2020 1005   NITRITE NEGATIVE 01/17/2020 1005   LEUKOCYTESUR NEGATIVE 01/17/2020 1005   Sepsis Labs: @LABRCNTIP (procalcitonin:4,lacticidven:4) ) Recent Results (from the past 240 hour(s))  Resp Panel by RT-PCR (Flu A&B, Covid) Nasopharyngeal Swab     Status: None   Collection Time: 02/14/20  3:52 AM   Specimen: Nasopharyngeal Swab; Nasopharyngeal(NP) swabs in vial transport medium  Result Value Ref Range Status   SARS Coronavirus 2 by RT PCR NEGATIVE NEGATIVE Final  Comment: (NOTE) SARS-CoV-2 target nucleic acids are NOT DETECTED.  The SARS-CoV-2 RNA is generally detectable in upper respiratory specimens during the acute phase of infection. The lowest concentration of SARS-CoV-2 viral copies this assay can detect is 138 copies/mL. A negative result does not preclude SARS-Cov-2 infection and should not be used as the sole basis for treatment or other patient management decisions. A negative result may occur with  improper specimen collection/handling, submission of specimen other than nasopharyngeal swab, presence of viral mutation(s) within the areas targeted by this assay, and inadequate number of viral copies(<138 copies/mL). A negative result must be combined with clinical observations, patient history, and epidemiological information. The expected result is Negative.  Fact Sheet for Patients:  EntrepreneurPulse.com.au  Fact Sheet for Healthcare Providers:  IncredibleEmployment.be  This test is no t yet approved or cleared by the [place] FDA and  has been authorized for detection and/or diagnosis of SARS-CoV-2 by FDA under an Emergency Use Authorization (EUA). This EUA will remain  in effect (meaning this test can be used) for the duration of the COVID-19 declaration under Section 564(b)(1) of the Act, 21 U.S.C.section 360bbb-3(b)(1), unless the authorization is  terminated  or revoked sooner.       Influenza A by PCR NEGATIVE NEGATIVE Final   Influenza B by PCR NEGATIVE NEGATIVE Final    Comment: (NOTE) The Xpert Xpress SARS-CoV-2/FLU/RSV plus assay is intended as an aid in the diagnosis of influenza from Nasopharyngeal swab specimens and should not be used as a sole basis for treatment. Nasal washings and aspirates are unacceptable for Xpert Xpress SARS-CoV-2/FLU/RSV testing.  Fact Sheet for Patients: EntrepreneurPulse.com.au  Fact Sheet for Healthcare Providers: IncredibleEmployment.be  This test is not yet approved or cleared by the [place] FDA and has been authorized for detection and/or diagnosis of SARS-CoV-2 by FDA under an Emergency Use Authorization (EUA). This EUA will remain in effect (meaning this test can be used) for the duration of the COVID-19 declaration under Section 564(b)(1) of the Act, 21 U.S.C. section 360bbb-3(b)(1), unless the authorization is terminated or revoked.  Performed at [facility], [place] [address] [place], [place] [address]          Radiology Studies: DG Pelvis Portable  Result Date: 02/14/2020 CLINICAL DATA:  Left hip/pelvic pain.  History of fall. EXAM: PORTABLE PELVIS 1-2 VIEWS COMPARISON:  01/17/2020. FINDINGS: Left superior pubic ramus fracture again noted. Fracture of the left inferior pubic ramus is also most likely present. Hips are intact. Diffuse osteopenia. Degenerative change lumbar spine and both hips. IMPRESSION: Left superior pubic ramus fracture again noted. Fracture of the left inferior pubic ramus is also most likely present. No evidence of hip fracture. Electronically Signed   By: Marcello Moores  Register   On: 02/14/2020 06:08   DG Chest Port 1 View  Result Date: 02/14/2020 CLINICAL DATA:  Rectal bleeding, fall 3 weeks prior EXAM: PORTABLE CHEST 1 VIEW COMPARISON:  01/17/2020 FINDINGS: No consolidation, features of edema, pneumothorax,  or effusion. Pulmonary vascularity is normally distributed. The aorta is calcified. The remaining cardiomediastinal contours are unremarkable. No acute osseous or soft tissue abnormality. Degenerative changes are present in the imaged spine and left shoulder. Prior right shoulder arthroplasty. Surgical clips project over the bilateral axilla and in the upper abdomen. Telemetry leads overlie the chest. IMPRESSION: No acute cardiopulmonary abnormality. Aortic Atherosclerosis (ICD10-I70.0). Electronically Signed   By: Lovena Le M.D.   On: 02/14/2020 03:21      Scheduled Meds: . sodium chloride  Intravenous Once  . clonazePAM  1 mg Oral Daily  . escitalopram  10 mg Oral QHS  . levothyroxine  44 mcg Intravenous Daily  . pantoprazole  40 mg Oral BID AC  . predniSONE  5 mg Oral Q breakfast  . sucralfate  1 g Oral TID WC & HS  . tamsulosin  0.4 mg Oral QPC supper   Continuous Infusions:    LOS: 1 day      Debbe Odea, MD [facility] Pager: [email or web address] 02/15/2020, 2:50 PM

## 2020-02-15 NOTE — Care Management (Signed)
02-15-20 1538 Case Manager spoke with patient and wife regarding [facility]. Patient sates-per orthopedic surgeon to hold off on PT to allow his pelvis to heal. Patient has declined Home Health PT at this time. Per patient he has durable medical equipment rolling walker, wheelchair and shower chair. Daughter takes him to allo appointments. Case Manager will follow for additional needs. Bethena Roys, RN,BSN Case Manager

## 2020-02-15 NOTE — Progress Notes (Signed)
   02/15/20 1230  Vitals  ECG Heart Rate 82  MEWS COLOR  MEWS Score Color Green  Orthostatic Lying   BP- Lying 114/57  Pulse- Lying 109  Orthostatic Sitting  BP- Sitting 91/63  Pulse- Sitting 93  Orthostatic Standing at 0 minutes  BP- Standing at 0 minutes (!) 78/56  Pulse- Standing at 0 minutes 108  Orthostatic Standing at 3 minutes  BP- Standing at 3 minutes (!) 70/54  Pulse- Standing at 3 minutes 108  MEWS Score  MEWS Temp 0  MEWS Systolic 0  MEWS Pulse 0  MEWS RR 0  MEWS LOC 0  MEWS Score 0

## 2020-02-15 NOTE — Progress Notes (Signed)
PT Cancellation Note  Patient Details Name: Maxwell Fox MRN: [id number] DOB: [birth year]   Cancelled Treatment:    Reason Eval/Treat Not Completed: Medical issues which prohibited therapy. Pt with low hgb, low bp and starting a unit of blood at this time. Will follow up tomorrow.   Shary Decamp [facility] 02/15/2020, 3:44 PM [place] Pager [phone] Office [phone]

## 2020-02-15 NOTE — Progress Notes (Signed)
[facility] Progress Note    Since last GI note: EGD yesterday, see full report in Epic.  Two deep anastomotic (marginal) ulcers at [place] anastomosis from remote Roux bariatric procedure. He was not on antiacid meds prior to admission and was taking a lot of NSAIDs for recent orthopedic problems. He received one unit of blood, not charted in Epic however.  He feels very fatigued still.  No nausea, vomiting. He tolerated liquids overnight. No abd pains.  Objective: Vital signs in last 24 hours: Temp:  [97.6 F (36.4 C)-98.6 F (37 C)] 98.3 F (36.8 C) (01/13 0421) Pulse Rate:  [67-83] 67 (01/13 0421) Resp:  [14-21] 17 (01/13 0421) BP: (91-114)/(39-71) 111/59 (01/13 0421) SpO2:  [98 %-100 %] 98 % (01/13 0421) Weight:  [86.4 kg] 86.4 kg (01/13 0421) Last BM Date: 02/14/20 General: alert and oriented times 3 Heart: regular rate and rythm Abdomen: soft, non-tender, non-distended, normal bowel sounds  Lab Results: Recent Labs    02/14/20 0257 02/14/20 0346 02/14/20 0928 02/14/20 1620  WBC 9.2  --  9.5 8.0  HGB 7.7* 7.1* 7.0* 8.3*  PLT 194  --  168 155  MCV 94.6  --  94.9 89.2   Recent Labs    02/14/20 0257 02/14/20 0346 02/15/20 0238  NA 139 139 138  K 4.5 4.8 3.9  CL 110 107 110  CO2 19*  --  20*  GLUCOSE 134* 106* 87  BUN 52* 57* 39*  CREATININE 1.00 1.00 0.86  CALCIUM 8.4*  --  8.0*   Recent Labs    02/14/20 0257  PROT 4.8*  ALBUMIN 2.6*  AST 25  ALT 17  ALKPHOS 129*  BILITOT 0.2*   Recent Labs    02/14/20 0257  INR 1.1    Studies/Results: DG Pelvis Portable  Result Date: 02/14/2020 CLINICAL DATA:  Left hip/pelvic pain.  History of fall. EXAM: PORTABLE PELVIS 1-2 VIEWS COMPARISON:  01/17/2020. FINDINGS: Left superior pubic ramus fracture again noted. Fracture of the left inferior pubic ramus is also most likely present. Hips are intact. Diffuse osteopenia. Degenerative change lumbar spine and both hips. IMPRESSION: Left superior pubic  ramus fracture again noted. Fracture of the left inferior pubic ramus is also most likely present. No evidence of hip fracture. Electronically Signed   By: Marcello Moores  Register   On: 02/14/2020 06:08   DG Chest Port 1 View  Result Date: 02/14/2020 CLINICAL DATA:  Rectal bleeding, fall 3 weeks prior EXAM: PORTABLE CHEST 1 VIEW COMPARISON:  01/17/2020 FINDINGS: No consolidation, features of edema, pneumothorax, or effusion. Pulmonary vascularity is normally distributed. The aorta is calcified. The remaining cardiomediastinal contours are unremarkable. No acute osseous or soft tissue abnormality. Degenerative changes are present in the imaged spine and left shoulder. Prior right shoulder arthroplasty. Surgical clips project over the bilateral axilla and in the upper abdomen. Telemetry leads overlie the chest. IMPRESSION: No acute cardiopulmonary abnormality. Aortic Atherosclerosis (ICD10-I70.0). Electronically Signed   By: Lovena Le M.D.   On: 02/14/2020 03:21    Medications: Scheduled Meds: . clonazePAM  1 mg Oral Daily  . escitalopram  10 mg Oral QHS  . levothyroxine  44 mcg Intravenous Daily  . pantoprazole (PROTONIX) IV  40 mg Intravenous Q12H  . predniSONE  5 mg Oral Q breakfast  . sucralfate  1 g Oral TID WC & HS  . tamsulosin  0.4 mg Oral QPC supper   Continuous Infusions: PRN Meds:.acetaminophen **OR** acetaminophen, fentaNYL (SUBLIMAZE) injection, HYDROcodone-acetaminophen   Assessment/Plan: 75 y.o.  male with UGI bleeding from two deep marginal (GJ anatomosis) ulcers (Hb 7.7 admission, down from 12 one month prior)  He tells me he received one unit of blood yesterday and that makes sense with his Hb values however it was not recorded in epic that I can see.  NSAIDs and prednisone likely contributed to these ulcers. He was not on antiacid meds prior to admission.  I do not think he is actively bleeding. I am changing his IV PPI to oral dosing BID, advancing his diet and I"ve ordered  an iron infusion which he has responded to very well in the past for IDA related to his bariatric anatomy.  If he does well over the next 24 hours he will probably be safe for d/c tomorrow.  H pylori stool antigen ordered, may have false neg due to his PPI usage currently.   Milus Banister, MD  02/15/2020, 8:45 AM [facility] Pager [phone]

## 2020-02-16 ENCOUNTER — Encounter (HOSPITAL_COMMUNITY): Payer: Self-pay | Admitting: Internal Medicine

## 2020-02-16 ENCOUNTER — Other Ambulatory Visit: Payer: Self-pay | Admitting: Physician Assistant

## 2020-02-16 ENCOUNTER — Encounter: Payer: Self-pay | Admitting: Physician Assistant

## 2020-02-16 DIAGNOSIS — K922 Gastrointestinal hemorrhage, unspecified: Secondary | ICD-10-CM

## 2020-02-16 DIAGNOSIS — D62 Acute posthemorrhagic anemia: Secondary | ICD-10-CM

## 2020-02-16 DIAGNOSIS — E273 Drug-induced adrenocortical insufficiency: Secondary | ICD-10-CM

## 2020-02-16 LAB — TYPE AND SCREEN
ABO/RH(D): A POS
Antibody Screen: NEGATIVE
Unit division: 0
Unit division: 0

## 2020-02-16 LAB — BPAM RBC
Blood Product Expiration Date: 202201292359
Blood Product Expiration Date: 202201312359
ISSUE DATE / TIME: 202201121000
ISSUE DATE / TIME: 202201131536
Unit Type and Rh: 6200
Unit Type and Rh: 6200

## 2020-02-16 LAB — CBC
HCT: 23.8 % — ABNORMAL LOW (ref 39.0–52.0)
Hemoglobin: 7.7 g/dL — ABNORMAL LOW (ref 13.0–17.0)
MCH: 29.5 pg (ref 26.0–34.0)
MCHC: 32.4 g/dL (ref 30.0–36.0)
MCV: 91.2 fL (ref 80.0–100.0)
Platelets: 127 10*3/uL — ABNORMAL LOW (ref 150–400)
RBC: 2.61 MIL/uL — ABNORMAL LOW (ref 4.22–5.81)
RDW: 15.9 % — ABNORMAL HIGH (ref 11.5–15.5)
WBC: 6.8 10*3/uL (ref 4.0–10.5)
nRBC: 0 % (ref 0.0–0.2)

## 2020-02-16 LAB — FERRITIN: Ferritin: 267 ng/mL (ref 24–336)

## 2020-02-16 LAB — IRON AND TIBC
Iron: 340 ug/dL — ABNORMAL HIGH (ref 45–182)
Saturation Ratios: 155 % — ABNORMAL HIGH (ref 17.9–39.5)
TIBC: 220 ug/dL — ABNORMAL LOW (ref 250–450)

## 2020-02-16 MED ORDER — SODIUM CHLORIDE 0.9 % IV SOLN: INTRAVENOUS | Status: DC

## 2020-02-16 NOTE — Progress Notes (Signed)
PROGRESS NOTE    Maxwell Fox   ZOX:[id number]  DOB: 10/20/1945  DOA: 02/14/2020 PCP: Pincus SanesBurns, Stacy J, MD   Brief Narrative:  Maxwell Fox is a 75 year old male with gastric bypass, melanoma, chronic adrenal insufficiency, recent fall with pelvic fractures being treated with NSAIDs who presents for black stools and severe weakness.  He states that he initially felt weak about 3 days ago.  The next day he began feeling palpitations and then he realized his stool was black. In the ED, hemoglobin was noted to be 7.7 and was 12.3 when last checked in 01/17/2020. 1 unit of packed red blood cells was ordered and a GI consult was requested.   Subjective: He is still orthostatic.  He is continuing to complain of dizziness when he is standing.  He dropped his blood pressure this morning when he was working with PT.  He received Feraheme infusion 1/13.  Assessment & Plan:   Principal Problem:   Acute GI bleeding melena-history of of Roux-en-Y anastomosis - Status post EGD which revealed 2 marginal ulcers at the area of anastomosis and LA grade B reflux esophagitis - no acute bleeding encountered on EGD  - Continue PPI twice daily for now.  Per GI he needs to continue Protonix 40 mg twice daily for at least 8 weeks.  Carafate suspension 1 g AC/HS x6 weeks.  Active Problems: Acute blood loss anemia-symptomatic - Hemoglobin is 7.7 today.  He received 1 unit of blood. -He has not had any bowel movement so far. -He says he has had iron deficiency because of his gastric bypass and poor iron absorption and in the past received iron infusions.  He received Feraheme infusion here. -He is continuing to have orthostatic hypotension with dizziness.  We will start him on IV hydration. Continue to monitor closely.    Closed fracture of left superior pubic ramus  -X-ray imaging from 12/15 also suggested that the left inferior pubic ramus was also fractured -Increased hydrocodone to 1-2 tabs every 4 hours  as needed-IV fentanyl being given for breakthrough pain  - patient states he was told by his orthopedic surgeon to hold off on PT to allow his pelvis to heal and he has declined home health PT  Hypothyroidism - Continue Synthroid  Adrenal insufficiency with chronic prednisone use - Continue prednisone at 5 mg daily  BPH - Continue Flomax  Depression and anxiety - Continue Lexapro and clonazepam  Time spent in minutes: 25 DVT prophylaxis: SCDs Start: 02/14/20 0631  Code Status: Full code Family Communication: Discussed with wife at bedside Disposition Plan:   The patient will require care spanning > 2 midnights and should be moved to inpatient because: IV treatments appropriate due to intensity of illness or inability to take PO  Dispo: The patient is from: Home              Anticipated d/c is to: home               Anticipated d/c date is:1- 2 days              Patient currently is not medically stable to d/c.  Consultants:   GI Procedures:   EGD Antimicrobials:  Anti-infectives (From admission, onward)   None      Objective: Vitals:   02/16/20 0441 02/16/20 0851 02/16/20 1144 02/16/20 1403  BP: 123/64 105/60 114/67 (!) 104/59  Pulse: 71 70 (!) 57 65  Resp: 18 18 18 18   Temp: 98.2 F (36.8 C)  97.6 F (36.4 C) (!) 97.4 F (36.3 C) 97.7 F (36.5 C)  TempSrc: Oral Oral Oral Oral  SpO2: 98%  98%   Weight: 87.2 kg     Height:        Intake/Output Summary (Last 24 hours) at 02/16/2020 1524 Last data filed at 02/16/2020 1405 Gross per 24 hour  Intake 1132 ml  Output 1000 ml  Net 132 ml   Filed Weights   02/14/20 0733 02/15/20 0421 02/16/20 0441  Weight: 88.5 kg 86.4 kg 87.2 kg    Examination: General exam: Appears comfortable  HEENT: PERRLA, oral mucosa moist, no sclera icterus or thrush Respiratory system: Clear to auscultation. Respiratory effort normal. Cardiovascular system:  S1, S2 Gastrointestinal system: Abdomen soft, non-tender, nondistended.  Normal bowel sounds   Central nervous system: Alert and oriented. No focal neurological deficits. Extremities: No cyanosis, clubbing or edema Skin: No rashes or ulcers Psychiatry:  Mood & affect appropriate.     Data Reviewed: I have personally reviewed following labs and imaging studies  CBC: Recent Labs  Lab 02/14/20 0257 02/14/20 0346 02/14/20 0928 02/14/20 1620 02/15/20 1005 02/16/20 0303  WBC 9.2  --  9.5 8.0 8.2 6.8  NEUTROABS 4.4  --   --   --   --   --   HGB 7.7* 7.1* 7.0* 8.3* 7.2* 7.7*  HCT 24.5* 21.0* 22.3* 24.7* 21.5* 23.8*  MCV 94.6  --  94.9 89.2 90.0 91.2  PLT 194  --  168 155 120* 751*   Basic Metabolic Panel: Recent Labs  Lab 02/14/20 0257 02/14/20 0346 02/15/20 0238  NA 139 139 138  K 4.5 4.8 3.9  CL 110 107 110  CO2 19*  --  20*  GLUCOSE 134* 106* 87  BUN 52* 57* 39*  CREATININE 1.00 1.00 0.86  CALCIUM 8.4*  --  8.0*   GFR: Estimated Creatinine Clearance: 77.8 mL/min (by C-G formula based on SCr of 0.86 mg/dL). Liver Function Tests: Recent Labs  Lab 02/14/20 0257  AST 25  ALT 17  ALKPHOS 129*  BILITOT 0.2*  PROT 4.8*  ALBUMIN 2.6*   No results for input(s): LIPASE, AMYLASE in the last 168 hours. No results for input(s): AMMONIA in the last 168 hours. Coagulation Profile: Recent Labs  Lab 02/14/20 0257  INR 1.1   Cardiac Enzymes: No results for input(s): CKTOTAL, CKMB, CKMBINDEX, TROPONINI in the last 168 hours. BNP (last 3 results) No results for input(s): PROBNP in the last 8760 hours. HbA1C: Recent Labs    02/14/20 1620  HGBA1C 4.7*   CBG: Recent Labs  Lab 02/14/20 0902 02/14/20 1227 02/14/20 2103  GLUCAP 106* 105* 83   Lipid Profile: No results for input(s): CHOL, HDL, LDLCALC, TRIG, CHOLHDL, LDLDIRECT in the last 72 hours. Thyroid Function Tests: No results for input(s): TSH, T4TOTAL, FREET4, T3FREE, THYROIDAB in the last 72 hours. Anemia Panel: No results for input(s): VITAMINB12, FOLATE, FERRITIN, TIBC,  IRON, RETICCTPCT in the last 72 hours. Urine analysis:    Component Value Date/Time   COLORURINE YELLOW 01/17/2020 1005   APPEARANCEUR CLEAR 01/17/2020 1005   LABSPEC 1.012 01/17/2020 1005   PHURINE 5.0 01/17/2020 1005   GLUCOSEU NEGATIVE 01/17/2020 1005   HGBUR MODERATE (A) 01/17/2020 1005   BILIRUBINUR NEGATIVE 01/17/2020 1005   KETONESUR NEGATIVE 01/17/2020 1005   PROTEINUR NEGATIVE 01/17/2020 1005   NITRITE NEGATIVE 01/17/2020 1005   LEUKOCYTESUR NEGATIVE 01/17/2020 1005   Sepsis Labs: @LABRCNTIP (procalcitonin:4,lacticidven:4) ) Recent Results (from the past 240 hour(s))  Resp  Panel by RT-PCR (Flu A&B, Covid) Nasopharyngeal Swab     Status: None   Collection Time: 02/14/20  3:52 AM   Specimen: Nasopharyngeal Swab; Nasopharyngeal(NP) swabs in vial transport medium  Result Value Ref Range Status   SARS Coronavirus 2 by RT PCR NEGATIVE NEGATIVE Final    Comment: (NOTE) SARS-CoV-2 target nucleic acids are NOT DETECTED.  The SARS-CoV-2 RNA is generally detectable in upper respiratory specimens during the acute phase of infection. The lowest concentration of SARS-CoV-2 viral copies this assay can detect is 138 copies/mL. A negative result does not preclude SARS-Cov-2 infection and should not be used as the sole basis for treatment or other patient management decisions. A negative result may occur with  improper specimen collection/handling, submission of specimen other than nasopharyngeal swab, presence of viral mutation(s) within the areas targeted by this assay, and inadequate number of viral copies(<138 copies/mL). A negative result must be combined with clinical observations, patient history, and epidemiological information. The expected result is Negative.  Fact Sheet for Patients:  EntrepreneurPulse.com.au  Fact Sheet for Healthcare Providers:  IncredibleEmployment.be  This test is no t yet approved or cleared by the [place]  FDA and  has been authorized for detection and/or diagnosis of SARS-CoV-2 by FDA under an Emergency Use Authorization (EUA). This EUA will remain  in effect (meaning this test can be used) for the duration of the COVID-19 declaration under Section 564(b)(1) of the Act, 21 U.S.C.section 360bbb-3(b)(1), unless the authorization is terminated  or revoked sooner.       Influenza A by PCR NEGATIVE NEGATIVE Final   Influenza B by PCR NEGATIVE NEGATIVE Final    Comment: (NOTE) The Xpert Xpress SARS-CoV-2/FLU/RSV plus assay is intended as an aid in the diagnosis of influenza from Nasopharyngeal swab specimens and should not be used as a sole basis for treatment. Nasal washings and aspirates are unacceptable for Xpert Xpress SARS-CoV-2/FLU/RSV testing.  Fact Sheet for Patients: EntrepreneurPulse.com.au  Fact Sheet for Healthcare Providers: IncredibleEmployment.be  This test is not yet approved or cleared by the [place] FDA and has been authorized for detection and/or diagnosis of SARS-CoV-2 by FDA under an Emergency Use Authorization (EUA). This EUA will remain in effect (meaning this test can be used) for the duration of the COVID-19 declaration under Section 564(b)(1) of the Act, 21 U.S.C. section 360bbb-3(b)(1), unless the authorization is terminated or revoked.  Performed at [facility], [place] [address], [place], [place] [address]          Radiology Studies: No results found.    Scheduled Meds: . clonazePAM  1 mg Oral Daily  . escitalopram  10 mg Oral QHS  . levothyroxine  88 mcg Oral Q0600  . pantoprazole  40 mg Oral BID AC  . predniSONE  5 mg Oral Q breakfast  . sucralfate  1 g Oral TID WC & HS  . tamsulosin  0.4 mg Oral QPC supper   Continuous Infusions:    LOS: 2 days   Maxwell Guthrie, MD [facility] Pager: [email or web address] 02/16/2020, 3:24 PM

## 2020-02-16 NOTE — Progress Notes (Addendum)
Daily Rounding Note  02/16/2020, 10:19 AM  LOS: 2 days   SUBJECTIVE:   Chief complaint: Maxwell Fox, FOBT +/black stool.  Weakness.   Still feels weak.  Yesterday when asked to stand up to get on the scale he got dizzy.  Has not been up on his feet today. Pressures in the 80s over 56s currently.  BPs have tended soft in the low 100s to 120s over 50s to 60s while inpatient.  Heart rates in the 70s..      No BMs at all since arrival.  No abdominal pain.  Tolerating solid food.  No shortness of breath.  Taking/receiving Vicodin 3 times daily for hip pain.  OBJECTIVE:         Vital signs in last 24 hours:    Temp:  [97.6 F (36.4 C)-98.6 F (37 C)] 97.6 F (36.4 C) (01/14 0851) Pulse Rate:  [61-87] 70 (01/14 0851) Resp:  [17-20] 18 (01/14 0851) BP: (100-128)/(56-69) 105/60 (01/14 0851) SpO2:  [97 %-99 %] 98 % (01/14 0441) Weight:  [87.2 kg] 87.2 kg (01/14 0441) Last BM Date: 02/14/20 Filed Weights   02/14/20 0733 02/15/20 0421 02/16/20 0441  Weight: 88.5 kg 86.4 kg 87.2 kg   General: Looks chronically unwell but alert and comfortable. Heart: RRR.  No MRG.  S1, S2 Chest: No labored breathing or cough Abdomen: Soft without tenderness.  No distention.  Active bowel sounds Neuro/Psych: Alert.  Good historian.  Oriented x3.  No tremors.  No gross deficits.  Lab Results: Recent Labs    02/14/20 1620 02/15/20 1005 02/16/20 0303  WBC 8.0 8.2 6.8  HGB 8.3* 7.2* 7.7*  HCT 24.7* 21.5* 23.8*  PLT 155 120* 127*   BMET Recent Labs    02/14/20 0257 02/14/20 0346 02/15/20 0238  NA 139 139 138  K 4.5 4.8 3.9  CL 110 107 110  CO2 19*  --  20*  GLUCOSE 134* 106* 87  BUN 52* 57* 39*  CREATININE 1.00 1.00 0.86  CALCIUM 8.4*  --  8.0*   LFT Recent Labs    02/14/20 0257  PROT 4.8*  ALBUMIN 2.6*  AST 25  ALT 17  ALKPHOS 129*  BILITOT 0.2*   PT/INR Recent Labs    02/14/20 0257  LABPROT 13.4  INR 1.1    Hepatitis Panel No results for input(s): HEPBSAG, HCVAB, HEPAIGM, HEPBIGM in the last 72 hours.  Studies/Results: No results found.  Scheduled Meds: . clonazePAM  1 mg Oral Daily  . escitalopram  10 mg Oral QHS  . levothyroxine  88 mcg Oral Q0600  . pantoprazole  40 mg Oral BID AC  . predniSONE  5 mg Oral Q breakfast  . sucralfate  1 g Oral TID WC & HS  . tamsulosin  0.4 mg Oral QPC supper   Continuous Infusions: PRN Meds:.acetaminophen **OR** acetaminophen, fentaNYL (SUBLIMAZE) injection, HYDROcodone-acetaminophen  ASSESMENT:   *   Upper GI bleed. 02/14/2020 EGD revealing 2 deep anastomotic ulcers at [place] anastomosis, grade B, mild to moderate esophagitis without bleeding.  No biopsies obtained. PTA NSAIDs and prednisone likely contributed to ulcer formation, no acid suppressing meds PTA. Day 3 PPI, day 3 Carafate. Stool H. pylori not yet collected.  *    Blood loss Fox. Hgb 7 >> 1 PRBC >> 8.3 >> 7.2 >> 1 PRBC >> 7.7 this AM   Feraheme infusion 1/13.  *   Thrombocytopenia, noncritical.  Intermittent occurrences dating back to  at least 2019.  *   Bariatric Roux-en-Y 2000.    *   Adrenal insufficiency, on chronic low-dose prednisone.  *    IPMN's, Dr. Hilarie Fredrickson recommended surveillance MRI at office visit 02/2016.  MRI abdomen performed 04/29/2016 at [place] at which point cystic lesions in the uncinate and body were stable.  *   L pelvic fractures.  Non op mgt.     PLAN   *   Protonix 40 mg po bid for at least 8 weeks. Carafate suspension 1 g AC/HS x 6 weeks.   Stool H Pylori not yet collected.    *   GI signing off.  Has ROV w GI 1/31, pre visit CBC ordered.      Maxwell Fox  02/16/2020, 10:19 AM Phone [phone]   ________________________________________________________________________  Maxwell Fox GI MD note:  I personally examined the patient, reviewed the data and agree with the assessment and plan described above.  The bleeding seems to have stopped and not  recurred. He should stay on PPI BID for at least the next 8 weeks, has office follow up with [place] later this month already scheduled. His H. Pylori stool Ag has not been collected and given his BID PPI I think there is a pretty high change of False negatives and so we are cancelling it and ordering H. Pylori serologies instead. He still seems pretty weak, not sure if he is ready for d/c.  I will leave that to his hosp team.  Please call or page with any further questions or concerns.    Owens Loffler, MD [facility] Pager [phone]

## 2020-02-16 NOTE — Progress Notes (Signed)
Physical Therapy Treatment Patient Details Name: Maxwell Fox MRN: [id number] DOB: 29-Oct-1945 Today's Date: 02/16/2020    History of Present Illness 75 year old male with gastric bypass, melanoma, chronic adrenal insufficiency, recent fall with pelvic fractures being treated with NSAIDs who presents for black stools and severe weakness. Pt underwent upper GI endoscopy on 02/14/2020.    PT Comments    Pt supine in bed on arrival, he reports feeling anxious about mobility due to previous falls.  PTA performed assessment of orthostatic vitals and patient symptomatic with + orthostasis. Informed nursing and deferred further tx at this time.    Orthostatic Vitals Lying-109/65 Sitting-92/58 Standing-84/57 Back to bed in supine-123/64    Follow Up Recommendations  Home health PT;Supervision for mobility/OOB     Equipment Recommendations  None recommended by PT    Recommendations for Other Services       Precautions / Restrictions Precautions Precautions: Fall Restrictions Weight Bearing Restrictions: Yes LLE Weight Bearing: Weight bearing as tolerated Other Position/Activity Restrictions: WBAT documented per last ortho note. Pt reports at outpatient follow-up the orthopedic doctor reported to limit WB through LLE. Pt reports that he is not NWB but that the doctor wanted less ambulation than was occuring at SNF    Mobility  Bed Mobility Overal bed mobility: Needs Assistance Bed Mobility: Supine to Sit;Sit to Supine     Supine to sit: Supervision Sit to supine: Supervision      Transfers Overall transfer level: Needs assistance Equipment used: Rolling walker (2 wheeled) Transfers: Sit to/from Stand Sit to Stand: Min assist         General transfer comment: Min assistance to boost into standing.  Ambulation/Gait Ambulation/Gait assistance: Min assist Gait Distance (Feet): 3 Feet (side stepping EOB to head board.) Assistive device: Rolling walker (2 wheeled) Gait  Pattern/deviations: Step-to pattern Gait velocity: reduced   General Gait Details: pt takes 3-4 side steps at the edge of bed. Further gt limited due to orthostasis.   Stairs             Wheelchair Mobility    Modified Rankin (Stroke Patients Only)       Balance Overall balance assessment: Needs assistance Sitting-balance support: No upper extremity supported;Feet supported Sitting balance-Leahy Scale: Good       Standing balance-Leahy Scale: Fair                              Cognition Arousal/Alertness: Awake/alert Behavior During Therapy: WFL for tasks assessed/performed Overall Cognitive Status: Within Functional Limits for tasks assessed                                        Exercises      General Comments        Pertinent Vitals/Pain Pain Assessment: 0-10 Pain Score: 5  Pain Location: L hip Pain Descriptors / Indicators: Aching Pain Intervention(s): Monitored during session;Repositioned    Home Living                      Prior Function            PT Goals (current goals can now be found in the care plan section) Acute Rehab PT Goals Patient Stated Goal: to return to prior level of function and to not fall again Potential to Achieve Goals: Good Progress towards PT goals: Progressing  toward goals    Frequency    Min 3X/week      PT Plan Current plan remains appropriate    Co-evaluation              AM-PAC PT "6 Clicks" Mobility   Outcome Measure  Help needed turning from your back to your side while in a flat bed without using bedrails?: A Little Help needed moving from lying on your back to sitting on the side of a flat bed without using bedrails?: A Little Help needed moving to and from a bed to a chair (including a wheelchair)?: A Little Help needed standing up from a chair using your arms (e.g., wheelchair or bedside chair)?: A Little Help needed to walk in hospital room?: A  Little Help needed climbing 3-5 steps with a railing? : A Lot 6 Click Score: 17    End of Session Equipment Utilized During Treatment: Gait belt Activity Tolerance: Patient tolerated treatment well Patient left: in bed;with call bell/phone within reach;with family/visitor present Nurse Communication: Mobility status (orthostasis, seated in chair position.) PT Visit Diagnosis: Other abnormalities of gait and mobility (R26.89);Muscle weakness (generalized) (M62.81);History of falling (Z91.81)     Time: 1242-1300 PT Time Calculation (min) (ACUTE ONLY): 18 min  Charges:  $Therapeutic Activity: 8-22 mins                     Erasmo Leventhal , PTA Acute Rehabilitation Services Pager [phone] Office [phone]     Maxwell Fox Maxwell Fox 02/16/2020, 1:09 PM

## 2020-02-17 DIAGNOSIS — S32592A Other specified fracture of left pubis, initial encounter for closed fracture: Secondary | ICD-10-CM

## 2020-02-17 LAB — CBC
HCT: 23.4 % — ABNORMAL LOW (ref 39.0–52.0)
Hemoglobin: 7.6 g/dL — ABNORMAL LOW (ref 13.0–17.0)
MCH: 30.3 pg (ref 26.0–34.0)
MCHC: 32.5 g/dL (ref 30.0–36.0)
MCV: 93.2 fL (ref 80.0–100.0)
Platelets: 136 10*3/uL — ABNORMAL LOW (ref 150–400)
RBC: 2.51 MIL/uL — ABNORMAL LOW (ref 4.22–5.81)
RDW: 16.5 % — ABNORMAL HIGH (ref 11.5–15.5)
WBC: 6.2 10*3/uL (ref 4.0–10.5)
nRBC: 0 % (ref 0.0–0.2)

## 2020-02-17 MED ORDER — HYDROCORTISONE NA SUCCINATE PF 100 MG IJ SOLR
50.0000 mg | Freq: Three times a day (TID) | INTRAMUSCULAR | Status: AC
Start: 2020-02-17 — End: 2020-02-18
  Administered 2020-02-17 – 2020-02-18 (×5): 50 mg via INTRAVENOUS
  Filled 2020-02-17 (×5): qty 2

## 2020-02-17 MED ORDER — OXYCODONE HCL 5 MG PO TABS
10.0000 mg | ORAL_TABLET | Freq: Four times a day (QID) | ORAL | Status: DC | PRN
  Administered 2020-02-17: 5 mg via ORAL
  Administered 2020-02-18 – 2020-02-20 (×9): 10 mg via ORAL
  Filled 2020-02-17 (×10): qty 2

## 2020-02-17 MED ORDER — OXYCODONE HCL 5 MG PO TABS
5.0000 mg | ORAL_TABLET | Freq: Four times a day (QID) | ORAL | Status: DC | PRN
  Administered 2020-02-17 (×2): 5 mg via ORAL
  Filled 2020-02-17 (×2): qty 1

## 2020-02-17 NOTE — Progress Notes (Signed)
[facility]  PROGRESS NOTE  Maxwell Fox NKN:[id number] DOB: Jun 05, 1945 DOA: 02/14/2020 PCP: [name], MD   Brief HPI:   75 year old male with history of gastric bypass, melanoma, chronic adrenal insufficiency, recent fall with pelvic fracture being treated with NSAIDs who presented with black stools and severe weakness.  In the ED he was found to have hemoglobin 7.7, last hemoglobin was 12.3 on 01/17/2020.  GI was consulted.    Subjective   Patient seen and examined, he developed orthostatic hypotension yesterday and complained of dizziness when standing.  He was started on IV normal saline at 100 mL/h.   Assessment/Plan:     1. Acute GI bleed-patient presented with melena, has history of Roux-en-Y anastomosis.  Status post EGD which revealed 2 marginal ulcers at the area of anastomosis and LA grade B reflux esophagitis.  No acute bleed encountered on EGD.  Continue PPI twice daily for at least 8 weeks, Carafate suspension 1 g before meals and at bedtime for 6 weeks. 2. Orthostatic hypotension-adrenal insufficiency-patient has adrenal sufficiency and takes prednisone 5 mg daily, he became hypotensive yesterday.  This is likely due to stress of GI bleed with EGD.  We will hold prednisone and start him on stress dose of Solu-Cortef 50 mg IV every 8 hours.  Check orthostatic vital signs every 8 hours. 3. Acute blood loss anemia-hemoglobin is stable at 7.6.  He is status post 1 unit PRBC.  He does have history of iron deficiency because of history of gastric bypass.  He has received Feraheme infusion during this hospitalization. 4. Closed fracture of left superior pubic ramus-x-ray imaging from 01/17/2020 suggested that he had left inferior pubic ramus fracture.  Hydrocodone is not helping as per patient.  We will start him on oxycodone 5 mg every 6 hours as needed for pain.  Home health PT has been recommended by PT. 5. History BPH-continue Flomax 6. History of  depression/anxiety-continue Lexapro, clonazepam.     COVID-19 Labs  Recent Labs    02/16/20 1652  FERRITIN 267    Lab Results  Component Value Date   SARSCOV2NAA NEGATIVE 02/14/2020   [place] NEGATIVE 01/22/2020   [place] NEGATIVE 01/17/2020     Scheduled medications:   . clonazePAM  1 mg Oral Daily  . escitalopram  10 mg Oral QHS  . hydrocortisone sod succinate (SOLU-CORTEF) inj  50 mg Intravenous Q8H  . levothyroxine  88 mcg Oral Q0600  . pantoprazole  40 mg Oral BID AC  . sucralfate  1 g Oral TID WC & HS  . tamsulosin  0.4 mg Oral QPC supper         CBG: Recent Labs  Lab 02/14/20 0902 02/14/20 1227 02/14/20 2103  GLUCAP 106* 105* 83    SpO2: 96 % O2 Flow Rate (L/min): 2 L/min    CBC: Recent Labs  Lab 02/14/20 0257 02/14/20 0346 02/14/20 0928 02/14/20 1620 02/15/20 1005 02/16/20 0303 02/17/20 0315  WBC 9.2  --  9.5 8.0 8.2 6.8 6.2  NEUTROABS 4.4  --   --   --   --   --   --   HGB 7.7*   < > 7.0* 8.3* 7.2* 7.7* 7.6*  HCT 24.5*   < > 22.3* 24.7* 21.5* 23.8* 23.4*  MCV 94.6  --  94.9 89.2 90.0 91.2 93.2  PLT 194  --  168 155 120* 127* 136*   < > = values in this interval not displayed.    Basic Metabolic Panel: Recent  Labs  Lab 02/14/20 0257 02/14/20 0346 02/15/20 0238  NA 139 139 138  K 4.5 4.8 3.9  CL 110 107 110  CO2 19*  --  20*  GLUCOSE 134* 106* 87  BUN 52* 57* 39*  CREATININE 1.00 1.00 0.86  CALCIUM 8.4*  --  8.0*     Liver Function Tests: Recent Labs  Lab 02/14/20 0257  AST 25  ALT 17  ALKPHOS 129*  BILITOT 0.2*  PROT 4.8*  ALBUMIN 2.6*     Antibiotics: Anti-infectives (From admission, onward)   None       DVT prophylaxis: SCDs  Code Status: Full code  Family Communication: No family at bedside   Consultants:  Gastroenterology  Procedures:  EGD    Objective   Vitals:   02/16/20 1403 02/16/20 1600 02/16/20 2035 02/17/20 0455  BP: (!) 104/59 109/72 102/60 114/67  Pulse: 65 (!) 59  64 (!) 56  Resp: 18 18 18 16   Temp: 97.7 F (36.5 C) 97.6 F (36.4 C) 98.2 F (36.8 C) 98.3 F (36.8 C)  TempSrc: Oral Oral Oral Oral  SpO2:  97% 98% 96%  Weight:    88.1 kg  Height:        Intake/Output Summary (Last 24 hours) at 02/17/2020 1354 Last data filed at 02/17/2020 0959 Gross per 24 hour  Intake 599 ml  Output 1500 ml  Net -901 ml    01/13 1901 - 01/15 0700 In: 2202 [P.O.:1020; I.V.:359] Out: 2250 [Urine:2250]  Filed Weights   02/15/20 0421 02/16/20 0441 02/17/20 0455  Weight: 86.4 kg 87.2 kg 88.1 kg    Physical Examination:    General-appears in no acute distress  Heart-S1-S2, regular, no murmur auscultated  Lungs-clear to auscultation bilaterally, no wheezing or crackles auscultated  Abdomen-soft, nontender, no organomegaly  Extremities-no edema in the lower extremities  Neuro-alert, oriented x3, no focal deficit noted   Status is: Inpatient  Dispo: The patient is from: Home              Anticipated d/c is to: Home              Anticipated d/c date is: 02/18/2020              Patient currently not medically stable for discharge  Barrier to discharge-treatment for orthostatic hypotension            Data Reviewed:   Recent Results (from the past 240 hour(s))  Resp Panel by RT-PCR (Flu A&B, Covid) Nasopharyngeal Swab     Status: None   Collection Time: 02/14/20  3:52 AM   Specimen: Nasopharyngeal Swab; Nasopharyngeal(NP) swabs in vial transport medium  Result Value Ref Range Status   SARS Coronavirus 2 by RT PCR NEGATIVE NEGATIVE Final    Comment: (NOTE) SARS-CoV-2 target nucleic acids are NOT DETECTED.  The SARS-CoV-2 RNA is generally detectable in upper respiratory specimens during the acute phase of infection. The lowest concentration of SARS-CoV-2 viral copies this assay can detect is 138 copies/mL. A negative result does not preclude SARS-Cov-2 infection and should not be used as the sole basis for treatment or other patient  management decisions. A negative result may occur with  improper specimen collection/handling, submission of specimen other than nasopharyngeal swab, presence of viral mutation(s) within the areas targeted by this assay, and inadequate number of viral copies(<138 copies/mL). A negative result must be combined with clinical observations, patient history, and epidemiological information. The expected result is Negative.  Fact Sheet for Patients:  EntrepreneurPulse.com.au  Fact Sheet for Healthcare Providers:  IncredibleEmployment.be  This test is no t yet approved or cleared by the [place] FDA and  has been authorized for detection and/or diagnosis of SARS-CoV-2 by FDA under an Emergency Use Authorization (EUA). This EUA will remain  in effect (meaning this test can be used) for the duration of the COVID-19 declaration under Section 564(b)(1) of the Act, 21 U.S.C.section 360bbb-3(b)(1), unless the authorization is terminated  or revoked sooner.       Influenza A by PCR NEGATIVE NEGATIVE Final   Influenza B by PCR NEGATIVE NEGATIVE Final    Comment: (NOTE) The Xpert Xpress SARS-CoV-2/FLU/RSV plus assay is intended as an aid in the diagnosis of influenza from Nasopharyngeal swab specimens and should not be used as a sole basis for treatment. Nasal washings and aspirates are unacceptable for Xpert Xpress SARS-CoV-2/FLU/RSV testing.  Fact Sheet for Patients: EntrepreneurPulse.com.au  Fact Sheet for Healthcare Providers: IncredibleEmployment.be  This test is not yet approved or cleared by the [place] FDA and has been authorized for detection and/or diagnosis of SARS-CoV-2 by FDA under an Emergency Use Authorization (EUA). This EUA will remain in effect (meaning this test can be used) for the duration of the COVID-19 declaration under Section 564(b)(1) of the Act, 21 U.S.C. section 360bbb-3(b)(1),  unless the authorization is terminated or revoked.  Performed at [facility], [place] [address] [place], [place] [address]         [facility] If 7PM-7AM, please contact night-coverage at [email or web address], Office  [phone]   02/17/2020, 1:54 PM  LOS: 3 days

## 2020-02-18 DIAGNOSIS — I951 Orthostatic hypotension: Secondary | ICD-10-CM

## 2020-02-18 LAB — BASIC METABOLIC PANEL
Anion gap: 8 (ref 5–15)
BUN: 14 mg/dL (ref 8–23)
CO2: 21 mmol/L — ABNORMAL LOW (ref 22–32)
Calcium: 8.2 mg/dL — ABNORMAL LOW (ref 8.9–10.3)
Chloride: 114 mmol/L — ABNORMAL HIGH (ref 98–111)
Creatinine, Ser: 0.86 mg/dL (ref 0.61–1.24)
GFR, Estimated: >60 mL/min (ref >60)
Glucose, Bld: 109 mg/dL — ABNORMAL HIGH (ref 70–99)
Potassium: 3.7 mmol/L (ref 3.5–5.1)
Sodium: 143 mmol/L (ref 135–145)

## 2020-02-18 LAB — CBC
HCT: 23.6 % — ABNORMAL LOW (ref 39.0–52.0)
Hemoglobin: 8 g/dL — ABNORMAL LOW (ref 13.0–17.0)
MCH: 31.5 pg (ref 26.0–34.0)
MCHC: 33.9 g/dL (ref 30.0–36.0)
MCV: 92.9 fL (ref 80.0–100.0)
Platelets: 164 10*3/uL (ref 150–400)
RBC: 2.54 MIL/uL — ABNORMAL LOW (ref 4.22–5.81)
RDW: 16.7 % — ABNORMAL HIGH (ref 11.5–15.5)
WBC: 8.4 10*3/uL (ref 4.0–10.5)
nRBC: 0 % (ref 0.0–0.2)

## 2020-02-18 LAB — GLUCOSE, CAPILLARY: Glucose-Capillary: 122 mg/dL — ABNORMAL HIGH (ref 70–99)

## 2020-02-18 MED ORDER — POLYETHYLENE GLYCOL 3350 17 G PO PACK
17.0000 g | PACK | Freq: Every day | ORAL | Status: DC
  Administered 2020-02-18 – 2020-02-19 (×2): 17 g via ORAL
  Filled 2020-02-18 (×3): qty 1

## 2020-02-18 MED ORDER — BISACODYL 10 MG RE SUPP: 10.0000 mg | Freq: Every day | RECTAL | Status: DC | PRN

## 2020-02-18 MED ORDER — PREDNISONE 5 MG PO TABS
5.0000 mg | ORAL_TABLET | Freq: Every day | ORAL | Status: DC
  Administered 2020-02-19 – 2020-02-20 (×2): 5 mg via ORAL
  Filled 2020-02-18 (×2): qty 1

## 2020-02-18 NOTE — Progress Notes (Addendum)
[facility]  PROGRESS NOTE  Maxwell Fox UMP:[id number] DOB: [birth year] DOA: 02/14/2020 PCP: [name], MD   Brief HPI:   75 year old male with history of gastric bypass, melanoma, chronic adrenal insufficiency, recent fall with pelvic fracture being treated with NSAIDs who presented with black stools and severe weakness.  In the ED he was found to have hemoglobin 7.7, last hemoglobin was 12.3 on 01/17/2020.  GI was consulted.   Subjective   Patient seen and examined, blood pressure improved after starting stress dose steroids.  He is still orthostatic, with more than 20 points drop in blood pressure on standing.  Though he denies dizziness.   Assessment/Plan:     1. Acute GI bleed-patient presented with melena, has history of Roux-en-Y anastomosis.  Status post EGD which revealed 2 marginal ulcers at the area of anastomosis and LA grade B reflux esophagitis.  No acute bleed encountered on EGD.  Continue PPI twice daily for at least 8 weeks, Carafate suspension 1 g before meals and at bedtime for 6 weeks. 2. Orthostatic hypotension-adrenal insufficiency-patient has adrenal sufficiency and takes prednisone 5 mg daily, he became hypotensive yesterday.  This is likely due to stress of GI bleed with EGD.  Prednisone was held and patient started on stress dose of  Solu-Cortef 50 mg IV every 8 hours.  Continue to check vital signs every 8 hours.  We will start TED hose.  We will start prednisone 5 mg daily from tomorrow morning. 3. Acute blood loss anemia-hemoglobin has improved to 8.0.  He received 1 unit PRBC in the hospital.   He does have history of iron deficiency because of history of gastric bypass.  He has received Feraheme infusion during this hospitalization. 4. Closed fracture of left superior pubic ramus-x-ray imaging from 01/17/2020 suggested that he had left inferior pubic ramus fracture.  Hydrocodone is not helping as per patient.  Patient started on oxycodone 5 mg every 6  hours as needed for pain.  Home health PT has been recommended by PT. 5. History BPH-continue Flomax 6. History of depression/anxiety-continue Lexapro, clonazepam.     COVID-19 Labs  Recent Labs    02/16/20 1652  FERRITIN 267    Lab Results  Component Value Date   SARSCOV2NAA NEGATIVE 02/14/2020   [place] NEGATIVE 01/22/2020   [place] NEGATIVE 01/17/2020     Scheduled medications:   . clonazePAM  1 mg Oral Daily  . escitalopram  10 mg Oral QHS  . hydrocortisone sod succinate (SOLU-CORTEF) inj  50 mg Intravenous Q8H  . levothyroxine  88 mcg Oral Q0600  . pantoprazole  40 mg Oral BID AC  . sucralfate  1 g Oral TID WC & HS  . tamsulosin  0.4 mg Oral QPC supper         CBG: Recent Labs  Lab 02/14/20 0902 02/14/20 1227 02/14/20 2103  GLUCAP 106* 105* 83    SpO2: 99 % O2 Flow Rate (L/min): 2 L/min    CBC: Recent Labs  Lab 02/14/20 0257 02/14/20 0346 02/14/20 1620 02/15/20 1005 02/16/20 0303 02/17/20 0315 02/18/20 0236  WBC 9.2   < > 8.0 8.2 6.8 6.2 8.4  NEUTROABS 4.4  --   --   --   --   --   --   HGB 7.7*   < > 8.3* 7.2* 7.7* 7.6* 8.0*  HCT 24.5*   < > 24.7* 21.5* 23.8* 23.4* 23.6*  MCV 94.6   < > 89.2 90.0 91.2 93.2 92.9  PLT 194   < >  155 120* 127* 136* 164   < > = values in this interval not displayed.    Basic Metabolic Panel: Recent Labs  Lab 02/14/20 0257 02/14/20 0346 02/15/20 0238 02/18/20 0236  NA 139 139 138 143  K 4.5 4.8 3.9 3.7  CL 110 107 110 114*  CO2 19*  --  20* 21*  GLUCOSE 134* 106* 87 109*  BUN 52* 57* 39* 14  CREATININE 1.00 1.00 0.86 0.86  CALCIUM 8.4*  --  8.0* 8.2*     Liver Function Tests: Recent Labs  Lab 02/14/20 0257  AST 25  ALT 17  ALKPHOS 129*  BILITOT 0.2*  PROT 4.8*  ALBUMIN 2.6*     Antibiotics: Anti-infectives (From admission, onward)   None       DVT prophylaxis: SCDs  Code Status: Full code  Family Communication: No family at  bedside   Consultants:  Gastroenterology  Procedures:  EGD    Objective   Vitals:   02/17/20 2336 02/18/20 0454 02/18/20 0500 02/18/20 0507  BP: 117/62 139/68 128/74   Pulse: 60 60    Resp: 16 16 17    Temp: 98.1 F (36.7 C) 98.1 F (36.7 C)    TempSrc: Oral Oral    SpO2: 98% 99%    Weight:    84.5 kg  Height:        Intake/Output Summary (Last 24 hours) at 02/18/2020 1307 Last data filed at 02/18/2020 0900 Gross per 24 hour  Intake 500 ml  Output 1575 ml  Net -1075 ml    01/14 1901 - 01/16 0700 In: 770 [I.V.:770] Out: 2175 [Urine:2175]  Filed Weights   02/16/20 0441 02/17/20 0455 02/18/20 0507  Weight: 87.2 kg 88.1 kg 84.5 kg    Physical Examination:    General-appears in no acute distress  Heart-S1-S2, regular, no murmur auscultated  Lungs-clear to auscultation bilaterally, no wheezing or crackles auscultated  Abdomen-soft, nontender, no organomegaly  Extremities-no edema in the lower extremities  Neuro-alert, oriented x3, no focal deficit noted   Status is: Inpatient  Dispo: The patient is from: Home              Anticipated d/c is to: Home              Anticipated d/c date is: 02/20/2020              Patient currently not medically stable for discharge  Barrier to discharge-treatment for orthostatic hypotension       Data Reviewed:   Recent Results (from the past 240 hour(s))  Resp Panel by RT-PCR (Flu A&B, Covid) Nasopharyngeal Swab     Status: None   Collection Time: 02/14/20  3:52 AM   Specimen: Nasopharyngeal Swab; Nasopharyngeal(NP) swabs in vial transport medium  Result Value Ref Range Status   SARS Coronavirus 2 by RT PCR NEGATIVE NEGATIVE Final    Comment: (NOTE) SARS-CoV-2 target nucleic acids are NOT DETECTED.  The SARS-CoV-2 RNA is generally detectable in upper respiratory specimens during the acute phase of infection. The lowest concentration of SARS-CoV-2 viral copies this assay can detect is 138 copies/mL. A  negative result does not preclude SARS-Cov-2 infection and should not be used as the sole basis for treatment or other patient management decisions. A negative result may occur with  improper specimen collection/handling, submission of specimen other than nasopharyngeal swab, presence of viral mutation(s) within the areas targeted by this assay, and inadequate number of viral copies(<138 copies/mL). A negative result must be combined  with clinical observations, patient history, and epidemiological information. The expected result is Negative.  Fact Sheet for Patients:  EntrepreneurPulse.com.au  Fact Sheet for Healthcare Providers:  IncredibleEmployment.be  This test is no t yet approved or cleared by the [place] FDA and  has been authorized for detection and/or diagnosis of SARS-CoV-2 by FDA under an Emergency Use Authorization (EUA). This EUA will remain  in effect (meaning this test can be used) for the duration of the COVID-19 declaration under Section 564(b)(1) of the Act, 21 U.S.C.section 360bbb-3(b)(1), unless the authorization is terminated  or revoked sooner.       Influenza A by PCR NEGATIVE NEGATIVE Final   Influenza B by PCR NEGATIVE NEGATIVE Final    Comment: (NOTE) The Xpert Xpress SARS-CoV-2/FLU/RSV plus assay is intended as an aid in the diagnosis of influenza from Nasopharyngeal swab specimens and should not be used as a sole basis for treatment. Nasal washings and aspirates are unacceptable for Xpert Xpress SARS-CoV-2/FLU/RSV testing.  Fact Sheet for Patients: EntrepreneurPulse.com.au  Fact Sheet for Healthcare Providers: IncredibleEmployment.be  This test is not yet approved or cleared by the [place] FDA and has been authorized for detection and/or diagnosis of SARS-CoV-2 by FDA under an Emergency Use Authorization (EUA). This EUA will remain in effect (meaning this test can  be used) for the duration of the COVID-19 declaration under Section 564(b)(1) of the Act, 21 U.S.C. section 360bbb-3(b)(1), unless the authorization is terminated or revoked.  Performed at [facility], [place] [address], [place], [place] [address]         [facility] If 7PM-7AM, please contact night-coverage at [email or web address], Office  [phone]   02/18/2020, 1:07 PM  LOS: 4 days

## 2020-02-18 NOTE — Plan of Care (Signed)
  Problem: Nutrition: Goal: Adequate nutrition will be maintained Outcome: Progressing   Problem: Skin Integrity: Goal: Risk for impaired skin integrity will decrease Outcome: Progressing   Problem: Nutrition: Goal: Adequate nutrition will be maintained Outcome: Progressing   Problem: Skin Integrity: Goal: Risk for impaired skin integrity will decrease Outcome: Progressing   

## 2020-02-19 ENCOUNTER — Encounter: Payer: Self-pay | Admitting: Internal Medicine

## 2020-02-19 DIAGNOSIS — S32512A Fracture of superior rim of left pubis, initial encounter for closed fracture: Secondary | ICD-10-CM

## 2020-02-19 MED ORDER — SODIUM CHLORIDE 0.9 % IV SOLN: INTRAVENOUS | Status: AC

## 2020-02-19 MED ORDER — SODIUM CHLORIDE 0.9 % IV BOLUS
500.0000 mL | Freq: Once | INTRAVENOUS | Status: AC
  Administered 2020-02-19: 500 mL via INTRAVENOUS

## 2020-02-19 NOTE — Progress Notes (Signed)
PROGRESS NOTE  Rajiv Parlato EVO:[id number] DOB: [birth year] DOA: 02/14/2020 PCP: [name], MD   LOS: 5 days   Brief narrative:  75 year old male with history of gastric bypass, melanoma, chronic adrenal insufficiency, recent fall with pelvic fracture being treated with NSAIDs who presented with black stools and severe weakness.  In the ED, he was found to have hemoglobin 7.7, last hemoglobin was 12.3 on 01/17/2020.  GI was consulted.  Assessment/Plan:  Principal Problem:   Acute GI bleeding Active Problems:   Adrenal insufficiency due to cancer therapy (HCC)   Closed fracture of left superior pubic ramus (HCC)   Acute blood loss anemia   Melena   Marginal ulcers   Gastroesophageal reflux disease with esophagitis without hemorrhage   GI bleed  Acute blood loss anemia secondary to acute GI bleed- with history of melena, patient presented with melena, has history of Roux-en-Y anastomosis.  Status post EGD which revealed 2 marginal ulcers at the area of anastomosis and LA grade B reflux esophagitis.   Continue PPI, Carafate.  Has improvement in symptoms.  Patient has received Feraheme and 1 unit of packed RBC.  Iron deficiency also secondary to Roux-en-Y anastomosis.  Patient will benefit from iron transfusion as outpatient.  Orthostatic hypotension-history of adrenal insufficiency and takes prednisone 5 mg daily.  Received IV Solu-Cortef initially.  Has been transitioned to p.o. prednisone again.  Patient is orthostatic with dizziness, lightheadedness with hypotension.  Will give 500 mL of IV fluid bolus.  Continue to monitor for orthostatic vitals.  Discussed with the patient regarding orthostatic precautions, elastic compression stockings.  We will put the patient on IV fluids for 1 day.  Check vitals in a.m.  Closed fracture of left superior pubic ramus-conservative treatment.  Continue oxycodone.  Home health PT  History BPH-continue Flomax  History of  depression/anxiety-continue Lexapro, clonazepam.   DVT prophylaxis: Place TED hose Start: 02/18/20 1020 SCDs Start: 02/14/20 0631   Code Status: Full code  Family Communication: None  Status is: Inpatient  Remains inpatient appropriate because:IV treatments appropriate due to intensity of illness or inability to take PO and Inpatient level of care appropriate due to severity of illness   Dispo: The patient is from: Home              Anticipated d/c is to: Home              Anticipated d/c date is: 1 day              Patient currently is not medically stable to d/c.              Barriers to discharge.  Orthostatic hypotension with symptoms.   Consultants:  GI  Procedures:  EGD.  Anti-infectives:  . None  Anti-infectives (From admission, onward)   None       Subjective: Today, patient was seen and examined at bedside.  Patient felt dizzy lightheaded when trying to stand up.  He was noted to be orthostatic.  Objective: Vitals:   02/18/20 2133 02/19/20 0500  BP: 131/64 129/69  Pulse:    Resp:    Temp: (!) 97.5 F (36.4 C) 98.3 F (36.8 C)  SpO2: 100% 100%    Intake/Output Summary (Last 24 hours) at 02/19/2020 0819 Last data filed at 02/19/2020 0500 Gross per 24 hour  Intake 480 ml  Output 1550 ml  Net -1070 ml   Filed Weights   02/16/20 0441 02/17/20 0455 02/18/20 0507  Weight: 87.2 kg 88.1  kg 84.5 kg   Body mass index is 26.73 kg/m.   Physical Exam: GENERAL: Patient is alert awake and oriented. Not in obvious distress. HENT: No scleral pallor or icterus. Pupils equally reactive to light. Oral mucosa is moist NECK: is supple, no gross swelling noted. CHEST: Clear to auscultation. No crackles or wheezes.  Diminished breath sounds bilaterally. CVS: S1 and S2 heard, no murmur. Regular rate and rhythm.  ABDOMEN: Soft, non-tender, bowel sounds are present. EXTREMITIES: No edema. CNS: Cranial nerves are intact. No focal motor deficits. SKIN: warm and  dry without rashes.  Data Review: I have personally reviewed the following laboratory data and studies,  CBC: Recent Labs  Lab 02/14/20 0257 02/14/20 0346 02/14/20 1620 02/15/20 1005 02/16/20 0303 02/17/20 0315 02/18/20 0236  WBC 9.2   < > 8.0 8.2 6.8 6.2 8.4  NEUTROABS 4.4  --   --   --   --   --   --   HGB 7.7*   < > 8.3* 7.2* 7.7* 7.6* 8.0*  HCT 24.5*   < > 24.7* 21.5* 23.8* 23.4* 23.6*  MCV 94.6   < > 89.2 90.0 91.2 93.2 92.9  PLT 194   < > 155 120* 127* 136* 164   < > = values in this interval not displayed.   Basic Metabolic Panel: Recent Labs  Lab 02/14/20 0257 02/14/20 0346 02/15/20 0238 02/18/20 0236  NA 139 139 138 143  K 4.5 4.8 3.9 3.7  CL 110 107 110 114*  CO2 19*  --  20* 21*  GLUCOSE 134* 106* 87 109*  BUN 52* 57* 39* 14  CREATININE 1.00 1.00 0.86 0.86  CALCIUM 8.4*  --  8.0* 8.2*   Liver Function Tests: Recent Labs  Lab 02/14/20 0257  AST 25  ALT 17  ALKPHOS 129*  BILITOT 0.2*  PROT 4.8*  ALBUMIN 2.6*   No results for input(s): LIPASE, AMYLASE in the last 168 hours. No results for input(s): AMMONIA in the last 168 hours. Cardiac Enzymes: No results for input(s): CKTOTAL, CKMB, CKMBINDEX, TROPONINI in the last 168 hours. BNP (last 3 results) No results for input(s): BNP in the last 8760 hours.  ProBNP (last 3 results) No results for input(s): PROBNP in the last 8760 hours.  CBG: Recent Labs  Lab 02/14/20 0902 02/14/20 1227 02/14/20 2103 02/18/20 2145  GLUCAP 106* 105* 83 122*   Recent Results (from the past 240 hour(s))  Resp Panel by RT-PCR (Flu A&B, Covid) Nasopharyngeal Swab     Status: None   Collection Time: 02/14/20  3:52 AM   Specimen: Nasopharyngeal Swab; Nasopharyngeal(NP) swabs in vial transport medium  Result Value Ref Range Status   SARS Coronavirus 2 by RT PCR NEGATIVE NEGATIVE Final    Comment: (NOTE) SARS-CoV-2 target nucleic acids are NOT DETECTED.  The SARS-CoV-2 RNA is generally detectable in upper  respiratory specimens during the acute phase of infection. The lowest concentration of SARS-CoV-2 viral copies this assay can detect is 138 copies/mL. A negative result does not preclude SARS-Cov-2 infection and should not be used as the sole basis for treatment or other patient management decisions. A negative result may occur with  improper specimen collection/handling, submission of specimen other than nasopharyngeal swab, presence of viral mutation(s) within the areas targeted by this assay, and inadequate number of viral copies(<138 copies/mL). A negative result must be combined with clinical observations, patient history, and epidemiological information. The expected result is Negative.  Fact Sheet for Patients:  EntrepreneurPulse.com.au  Fact Sheet for Healthcare Providers:  IncredibleEmployment.be  This test is no t yet approved or cleared by the [place] FDA and  has been authorized for detection and/or diagnosis of SARS-CoV-2 by FDA under an Emergency Use Authorization (EUA). This EUA will remain  in effect (meaning this test can be used) for the duration of the COVID-19 declaration under Section 564(b)(1) of the Act, 21 U.S.C.section 360bbb-3(b)(1), unless the authorization is terminated  or revoked sooner.       Influenza A by PCR NEGATIVE NEGATIVE Final   Influenza B by PCR NEGATIVE NEGATIVE Final    Comment: (NOTE) The Xpert Xpress SARS-CoV-2/FLU/RSV plus assay is intended as an aid in the diagnosis of influenza from Nasopharyngeal swab specimens and should not be used as a sole basis for treatment. Nasal washings and aspirates are unacceptable for Xpert Xpress SARS-CoV-2/FLU/RSV testing.  Fact Sheet for Patients: EntrepreneurPulse.com.au  Fact Sheet for Healthcare Providers: IncredibleEmployment.be  This test is not yet approved or cleared by the [place] FDA and has been  authorized for detection and/or diagnosis of SARS-CoV-2 by FDA under an Emergency Use Authorization (EUA). This EUA will remain in effect (meaning this test can be used) for the duration of the COVID-19 declaration under Section 564(b)(1) of the Act, 21 U.S.C. section 360bbb-3(b)(1), unless the authorization is terminated or revoked.  Performed at [facility], [place] [address] [place], [place] [address]      Studies: No results found.    Flora Lipps, MD  [facility] 02/19/2020  If 7PM-7AM, please contact night-coverage

## 2020-02-19 NOTE — Progress Notes (Signed)
PT Cancellation Note  Patient Details Name: Maxwell Fox MRN: [id number] DOB: 1945/06/11   Cancelled Treatment:    Reason Eval/Treat Not Completed: Medical issues which prohibited therapy. Pt orthostatic earlier this PM. Will continue to follow.   Shary Decamp [place] 02/19/2020, 4:20 PM Suanne Marker PT Acute Rehabilitation Services Pager [phone] Office [phone]

## 2020-02-20 LAB — CBC
HCT: 25.9 % — ABNORMAL LOW (ref 39.0–52.0)
Hemoglobin: 8.4 g/dL — ABNORMAL LOW (ref 13.0–17.0)
MCH: 31.1 pg (ref 26.0–34.0)
MCHC: 32.4 g/dL (ref 30.0–36.0)
MCV: 95.9 fL (ref 80.0–100.0)
Platelets: 170 10*3/uL (ref 150–400)
RBC: 2.7 MIL/uL — ABNORMAL LOW (ref 4.22–5.81)
RDW: 18.2 % — ABNORMAL HIGH (ref 11.5–15.5)
WBC: 7.6 10*3/uL (ref 4.0–10.5)
nRBC: 0.3 % — ABNORMAL HIGH (ref 0.0–0.2)

## 2020-02-20 LAB — BASIC METABOLIC PANEL
Anion gap: 7 (ref 5–15)
BUN: 13 mg/dL (ref 8–23)
CO2: 22 mmol/L (ref 22–32)
Calcium: 7.9 mg/dL — ABNORMAL LOW (ref 8.9–10.3)
Chloride: 112 mmol/L — ABNORMAL HIGH (ref 98–111)
Creatinine, Ser: 0.9 mg/dL (ref 0.61–1.24)
GFR, Estimated: >60 mL/min (ref >60)
Glucose, Bld: 71 mg/dL (ref 70–99)
Potassium: 3.5 mmol/L (ref 3.5–5.1)
Sodium: 141 mmol/L (ref 135–145)

## 2020-02-20 LAB — MAGNESIUM: Magnesium: 2 mg/dL (ref 1.7–2.4)

## 2020-02-20 MED ORDER — PANTOPRAZOLE SODIUM 40 MG PO TBEC: DELAYED_RELEASE_TABLET | ORAL | 1 refills | Status: DC

## 2020-02-20 MED ORDER — OXYCODONE HCL 10 MG PO TABS: 10.0000 mg | ORAL_TABLET | Freq: Four times a day (QID) | ORAL | 0 refills | Status: DC | PRN

## 2020-02-20 MED ORDER — POLYETHYLENE GLYCOL 3350 17 G PO PACK: 17.0000 g | PACK | Freq: Every day | ORAL | 0 refills | Status: DC | PRN

## 2020-02-20 MED ORDER — SUCRALFATE 1 GM/10ML PO SUSP: 1.0000 g | Freq: Three times a day (TID) | ORAL | 0 refills | Status: DC

## 2020-02-20 NOTE — Plan of Care (Signed)

## 2020-02-20 NOTE — Plan of Care (Signed)

## 2020-02-20 NOTE — Care Management Important Message (Signed)
Important Message  Patient Details  Name: Maxwell Fox MRN: [id number] Date of Birth: 21-Jan-1946   Medicare Important Message Given:  Yes     [name] 02/20/2020, 11:18 AM

## 2020-02-20 NOTE — Progress Notes (Signed)
Slightly lightheaded

## 2020-02-20 NOTE — TOC Initial Note (Signed)
Transition of Care [facility]) - Initial/Assessment Note    Patient Details  Name: Maxwell Fox MRN: [id number] Date of Birth: 1945/10/21  Transition of Care [facility] CM/SW Contact:    Bethena Roys, RN Phone Number: 02/20/2020, 12:19 PM  Clinical Narrative: Plan for transition home today. Case Manager previously asked patient about Home health services and the patient declined services. Patient now is agreeable to home health physical therapy. Wants to use [facility] referral made and the patient is active with [facility]. Start of care to begin within 24-48 hours post transition home. Patient has transportation home. No further needs from Case Manager at this time.                 Expected Discharge Plan: [place] Barriers to Discharge: No Barriers Identified   Patient Goals and CMS Choice Patient states their goals for this hospitalization and ongoing recovery are:: to return home with home health services CMS Medicare.gov Compare Post Acute Care list provided to:: Patient Choice offered to / list presented to : Patient  Expected Discharge Plan and Services Expected Discharge Plan: [place] In-house Referral: NA Discharge Planning Services: CM Consult Post Acute Care Choice: Home Health,Resumption of Svcs/PTA Provider Living arrangements for the past 2 months: Single Family Home Expected Discharge Date: 02/20/20                 DME Agency: NA       HH Arranged: PT HH Agency: [place] [facility] Agency Contacted: 02/20/20 Time HH Agency Contacted: 1219 Representative spoke with at [place]: Tommi Rumps  Prior Living Arrangements/Services Living arrangements for the past 2 months: Single Family Home Lives with:: Spouse Patient language and need for interpreter reviewed:: Yes Do you feel safe going back to the place where you live?: Yes      Need for Family Participation in Patient Care: Yes (Comment) Care giver support system in  place?: Yes (comment)   Criminal Activity/Legal Involvement Pertinent to Current Situation/Hospitalization: No - Comment as needed  Activities of Daily Living Home Assistive Devices/Equipment: Walker (specify type),Shower chair without back ADL Screening (condition at time of admission) Patient's cognitive ability adequate to safely complete daily activities?: Yes Is the patient deaf or have difficulty hearing?: No Does the patient have difficulty seeing, even when wearing glasses/contacts?: No Does the patient have difficulty concentrating, remembering, or making decisions?: No Patient able to express need for assistance with ADLs?: Yes Does the patient have difficulty dressing or bathing?: No Independently performs ADLs?: Yes (appropriate for developmental age) Does the patient have difficulty walking or climbing stairs?: Yes Weakness of Legs: Both Weakness of Arms/Hands: None  Permission Sought/Granted Permission sought to share information with : Family Supports Permission granted to share information with : Yes, Verbal Permission Granted     Permission granted to share info w AGENCY: [facility]        Emotional Assessment Appearance:: Appears stated age Attitude/Demeanor/Rapport: Engaged Affect (typically observed): Appropriate Orientation: : Oriented to Situation,Oriented to  Time,Oriented to Place,Oriented to Self Alcohol / Substance Use: Not Applicable Psych Involvement: No (comment)  Admission diagnosis:  Dyspnea [R06.00] Acute GI bleeding [K92.2] Closed fracture of left inferior pubic ramus, initial encounter ([place]) [S32.592A] Gastrointestinal hemorrhage, unspecified gastrointestinal hemorrhage type [K92.2] GI bleed [K92.2] Patient Active Problem List   Diagnosis Date Noted  . Acute GI bleeding 02/14/2020  . Acute blood loss anemia 02/14/2020  . GI bleed 02/14/2020  . Melena   .  Marginal ulcers   . Gastroesophageal reflux disease with esophagitis without hemorrhage    . Closed fracture of left superior pubic ramus ([place]) 02/09/2020  . Anxiety and depression 01/10/2018  . History of antineoplastic chemotherapy 07/19/2017  . Chronic midline low back pain without sciatica 04/19/2017  . Metastatic melanoma ([place]) 01/07/2017  . Adrenal insufficiency due to cancer therapy ([place]) 07/27/2016  . Secondary hypothyroidism 07/27/2016  . H/O gastric bypass 01/06/2016  . Pancreatic cyst 01/06/2016  . Metastatic melanoma of brain 12/31/2015  . Iron deficiency anemia   . B12 nutritional deficiency   . Benign prostatic hyperplasia    PCP:  [name], MD Pharmacy:   [facility] [place], [place] - [address] AT [place] [address] Phone: [phone] Fax: [place], [place] AT [place] [address] Phone: [phone] Fax: [phone]  Readmission Risk Interventions No flowsheet data found.

## 2020-02-20 NOTE — Progress Notes (Signed)
Physical Therapy Treatment Patient Details Name: Maxwell Fox MRN: [id number] DOB: 25-Oct-1945 Today's Date: 02/20/2020    History of Present Illness 75 year old male with gastric bypass, melanoma, chronic adrenal insufficiency, recent fall with pelvic fractures being treated with NSAIDs who presents for black stools and severe weakness. Pt underwent upper GI endoscopy on 02/14/2020.    PT Comments    Pt progressing towards his physical therapy goals. Pt received sitting up in chair; after initial stand, pt with positive orthostatic hypotension and pallor. Returned to sitting and worked on ankle pumps until BP improved. Able to participate in seated therapeutic exercises and ambulate 100 ft x 2 with min guard assist and chair follow. D/c plan remains appropriate.   Orthostatic Vitals: Sitting: 99/59 (69) Standing: 66/45 (54) Standing after 3 minutes: 66/46 (51) Sitting: 90/58    Follow Up Recommendations  Home health PT;Supervision for mobility/OOB     Equipment Recommendations  None recommended by PT    Recommendations for Other Services       Precautions / Restrictions Precautions Precautions: Fall;Other (comment) Precaution Comments: orthostatic Restrictions Weight Bearing Restrictions: No LLE Weight Bearing: Weight bearing as tolerated    Mobility  Bed Mobility               General bed mobility comments: OOB in chair  Transfers Overall transfer level: Needs assistance Equipment used: Rolling walker (2 wheeled) Transfers: Sit to/from Stand Sit to Stand: Supervision         General transfer comment: Cues for scooting to edge of chair. no physical assist required  Ambulation/Gait Ambulation/Gait assistance: Min guard Gait Distance (Feet): 200 Feet (100x2) Assistive device: Rolling walker (2 wheeled) Gait Pattern/deviations: Decreased stride length;Step-through pattern;Decreased weight shift to left Gait velocity: decreased   General Gait Details: Pt  ambulating 100 ft, then another 100 ft with seated rest break. Min guard assist for safety   Stairs             Wheelchair Mobility    Modified Rankin (Stroke Patients Only)       Balance Overall balance assessment: Needs assistance Sitting-balance support: Feet supported Sitting balance-Leahy Scale: Good     Standing balance support: No upper extremity supported;Bilateral upper extremity supported Standing balance-Leahy Scale: Poor                              Cognition Arousal/Alertness: Awake/alert Behavior During Therapy: WFL for tasks assessed/performed Overall Cognitive Status: Within Functional Limits for tasks assessed                                        Exercises General Exercises - Lower Extremity Long Arc Quad: Both;10 reps;Seated Toe Raises: Both;20 reps;Seated Heel Raises: Both;20 reps;Seated    General Comments        Pertinent Vitals/Pain Pain Assessment: Faces Faces Pain Scale: Hurts little more Pain Location: L hip Pain Descriptors / Indicators: Aching Pain Intervention(s): Monitored during session    Home Living                      Prior Function            PT Goals (current goals can now be found in the care plan section) Acute Rehab PT Goals Patient Stated Goal: "get stronger, walk more." Potential to Achieve Goals: Good Progress towards PT goals:  Progressing toward goals    Frequency    Min 3X/week      PT Plan Current plan remains appropriate    Co-evaluation              AM-PAC PT "6 Clicks" Mobility   Outcome Measure  Help needed turning from your back to your side while in a flat bed without using bedrails?: None Help needed moving from lying on your back to sitting on the side of a flat bed without using bedrails?: None Help needed moving to and from a bed to a chair (including a wheelchair)?: None Help needed standing up from a chair using your arms (e.g.,  wheelchair or bedside chair)?: None Help needed to walk in hospital room?: A Little Help needed climbing 3-5 steps with a railing? : A Little 6 Click Score: 22    End of Session Equipment Utilized During Treatment: Gait belt Activity Tolerance: Patient tolerated treatment well Patient left: in chair;with call bell/phone within reach Nurse Communication: Mobility status PT Visit Diagnosis: Other abnormalities of gait and mobility (R26.89);Muscle weakness (generalized) (M62.81);History of falling (Z91.81)     Time: 9357-0177 PT Time Calculation (min) (ACUTE ONLY): 29 min  Charges:  $Therapeutic Activity: 23-37 mins                     Maxwell Fox, PT, DPT Acute Rehabilitation Services Pager [phone] Office [phone]    Maxwell Fox 02/20/2020, 12:41 PM

## 2020-02-20 NOTE — Discharge Summary (Signed)
Physician Discharge Summary  Maxwell Fox [id number] DOB: [birth year] DOA: 02/14/2020  PCP: [name], MD  Admit date: 02/14/2020 Discharge date: 02/20/2020  Admitted From: Home  Discharge disposition: Home  Recommendations for Outpatient Follow-Up:   . Follow up with your primary care provider in one week.  . Check CBC, BMP, magnesium in the next visit  Discharge Diagnosis:   Principal Problem:   Acute GI bleeding Active Problems:   Adrenal insufficiency due to cancer therapy ([facility])   Closed fracture of left superior pubic ramus ([facility])   Acute blood loss anemia   Melena   Marginal ulcers   Gastroesophageal reflux disease with esophagitis without hemorrhage   GI bleed Orthostatic hypotension  Discharge Condition: Improved.  Diet recommendation:   Regular.  Wound care: None.  Code status: Full.   History of Present Illness:   75 year old male with history of gastric bypass, melanoma, chronic adrenal insufficiency, recent fall with pelvic fracture being treated with NSAIDs who presented with black stools and severe weakness.  In the ED, he was found to have hemoglobin 7.7, last hemoglobin was 12.3 on 01/17/2020.  GI was consulted. Patient underwent EGD with two marginal ulcers. GI recommended Protonix twice daily and outpatient follow-up.   Hospital Course:   Following conditions were addressed during hospitalization as listed below,  Acute blood loss anemia secondary to acute GI bleed- with history of melena, patient presented with melena, has history of Roux-en-Y anastomosis.  Status post EGD which revealed 2 marginal ulcers at the area of anastomosis and LA grade B reflux esophagitis.  Patient was continued on PPI, Carafate.  Has improvement in symptoms.  Patient has received Feraheme and 1 unit of packed RBC.  Iron deficiency also secondary to Roux-en-Y anastomosis.  Patient will benefit from iron transfusion as outpatient.  Patient was advised to follow-up  with GI and PCP as outpatient.  Patient will be given Protonix twice daily for 8 weeks followed by once daily.  He was discouraged against NSAIDs   Orthostatic hypotension-history of adrenal insufficiency and takes prednisone 5 mg daily.  Received IV Solu-Cortef initially.  Has been transitioned to p.o. prednisone again.    Patient received IV fluid bolus followed by IV fluids with improvement in his symptoms.  He was educated on orthostatic precautions, he was encouraged to take time to change positions, with elastic stockings.  Will liberalize diet on discharge.   Encouraged to use her walker on discharge.  Home health will be organized on discharge.   Closed fracture of left superior pubic ramus-and was put on conservative treatment.  Continue oxycodone on discharge.  Patient has been advised to limit pain medication to prevent falls and hypotension.   Will be recommended to home health PT on discharge.   History BPH-continue Flomax   History of depression/anxiety-continue Lexapro, clonazepam.  Disposition.  At this time, patient is stable for disposition home with home health.  She will need to follow-up with her primary care physician and GI as outpatient.  Medical Consultants:    GI  Procedures:    EGD. Subjective:   Today, patient seen and examined at bedside.  Denies any nausea vomiting abdominal pain.  Had dark bowel movement yesterday but no drop in hemoglobin.  Feels much better today but had little dizziness  Discharge Exam:   Vitals:   02/20/20 0835 02/20/20 0838  BP: (!) 81/59 (!) 76/61  Pulse:    Resp:    Temp:    SpO2:  Vitals:   02/20/20 0543 02/20/20 0833 02/20/20 0835 02/20/20 0838  BP: 124/70 (!) 89/67 (!) 81/59 (!) 76/61  Pulse: 63     Resp: 17     Temp: 98.7 F (37.1 C)     TempSrc: Oral     SpO2: 98%     Weight: 86.2 kg     Height:       General: Alert awake, not in obvious distress HENT: pupils equally reacting to light, mild pallor  noted. Chest:  Clear breath sounds.  Diminished breath sounds bilaterally. No crackles or wheezes.  CVS: S1 &S2 heard. No murmur.  Regular rate and rhythm. Abdomen: Soft, nontender, nondistended.  Bowel sounds are heard.   Extremities: No cyanosis, clubbing or edema.  Peripheral pulses are palpable. Psych: Alert, awake and oriented, normal mood CNS:  No cranial nerve deficits.  Power equal in all extremities.   Skin: Warm and dry.  No rashes noted.  The results of significant diagnostics from this hospitalization (including imaging, microbiology, ancillary and laboratory) are listed below for reference.     Diagnostic Studies:   DG Pelvis Portable  Result Date: 02/14/2020 CLINICAL DATA:  Left hip/pelvic pain.  History of fall. EXAM: PORTABLE PELVIS 1-2 VIEWS COMPARISON:  01/17/2020. FINDINGS: Left superior pubic ramus fracture again noted. Fracture of the left inferior pubic ramus is also most likely present. Hips are intact. Diffuse osteopenia. Degenerative change lumbar spine and both hips. IMPRESSION: Left superior pubic ramus fracture again noted. Fracture of the left inferior pubic ramus is also most likely present. No evidence of hip fracture. Electronically Signed   By: Marcello Moores  Register   On: 02/14/2020 06:08   DG Chest Port 1 View  Result Date: 02/14/2020 CLINICAL DATA:  Rectal bleeding, fall 3 weeks prior EXAM: PORTABLE CHEST 1 VIEW COMPARISON:  01/17/2020 FINDINGS: No consolidation, features of edema, pneumothorax, or effusion. Pulmonary vascularity is normally distributed. The aorta is calcified. The remaining cardiomediastinal contours are unremarkable. No acute osseous or soft tissue abnormality. Degenerative changes are present in the imaged spine and left shoulder. Prior right shoulder arthroplasty. Surgical clips project over the bilateral axilla and in the upper abdomen. Telemetry leads overlie the chest. IMPRESSION: No acute cardiopulmonary abnormality. Aortic Atherosclerosis  (ICD10-I70.0). Electronically Signed   By: Lovena Le M.D.   On: 02/14/2020 03:21     Labs:   Basic Metabolic Panel: Recent Labs  Lab 02/14/20 0257 02/14/20 0346 02/15/20 0238 02/18/20 0236 02/20/20 0321  NA 139 139 138 143 141  K 4.5 4.8 3.9 3.7 3.5  CL 110 107 110 114* 112*  CO2 19*  --  20* 21* 22  GLUCOSE 134* 106* 87 109* 71  BUN 52* 57* 39* 14 13  CREATININE 1.00 1.00 0.86 0.86 0.90  CALCIUM 8.4*  --  8.0* 8.2* 7.9*  MG  --   --   --   --  2.0   GFR Estimated Creatinine Clearance: 74.4 mL/min (by C-G formula based on SCr of 0.9 mg/dL). Liver Function Tests: Recent Labs  Lab 02/14/20 0257  AST 25  ALT 17  ALKPHOS 129*  BILITOT 0.2*  PROT 4.8*  ALBUMIN 2.6*   No results for input(s): LIPASE, AMYLASE in the last 168 hours. No results for input(s): AMMONIA in the last 168 hours. Coagulation profile Recent Labs  Lab 02/14/20 0257  INR 1.1    CBC: Recent Labs  Lab 02/14/20 0257 02/14/20 0346 02/15/20 1005 02/16/20 0303 02/17/20 0315 02/18/20 0236 02/20/20 0321  WBC  9.2   < > 8.2 6.8 6.2 8.4 7.6  NEUTROABS 4.4  --   --   --   --   --   --   HGB 7.7*   < > 7.2* 7.7* 7.6* 8.0* 8.4*  HCT 24.5*   < > 21.5* 23.8* 23.4* 23.6* 25.9*  MCV 94.6   < > 90.0 91.2 93.2 92.9 95.9  PLT 194   < > 120* 127* 136* 164 170   < > = values in this interval not displayed.   Cardiac Enzymes: No results for input(s): CKTOTAL, CKMB, CKMBINDEX, TROPONINI in the last 168 hours. BNP: Invalid input(s): POCBNP CBG: Recent Labs  Lab 02/14/20 0902 02/14/20 1227 02/14/20 2103 02/18/20 2145  GLUCAP 106* 105* 83 122*   D-Dimer No results for input(s): DDIMER in the last 72 hours. Hgb A1c No results for input(s): HGBA1C in the last 72 hours. Lipid Profile No results for input(s): CHOL, HDL, LDLCALC, TRIG, CHOLHDL, LDLDIRECT in the last 72 hours. Thyroid function studies No results for input(s): TSH, T4TOTAL, T3FREE, THYROIDAB in the last 72 hours.  Invalid input(s):  FREET3 Anemia work up No results for input(s): VITAMINB12, FOLATE, FERRITIN, TIBC, IRON, RETICCTPCT in the last 72 hours. Microbiology Recent Results (from the past 240 hour(s))  Resp Panel by RT-PCR (Flu A&B, Covid) Nasopharyngeal Swab     Status: None   Collection Time: 02/14/20  3:52 AM   Specimen: Nasopharyngeal Swab; Nasopharyngeal(NP) swabs in vial transport medium  Result Value Ref Range Status   SARS Coronavirus 2 by RT PCR NEGATIVE NEGATIVE Final    Comment: (NOTE) SARS-CoV-2 target nucleic acids are NOT DETECTED.  The SARS-CoV-2 RNA is generally detectable in upper respiratory specimens during the acute phase of infection. The lowest concentration of SARS-CoV-2 viral copies this assay can detect is 138 copies/mL. A negative result does not preclude SARS-Cov-2 infection and should not be used as the sole basis for treatment or other patient management decisions. A negative result may occur with  improper specimen collection/handling, submission of specimen other than nasopharyngeal swab, presence of viral mutation(s) within the areas targeted by this assay, and inadequate number of viral copies(<138 copies/mL). A negative result must be combined with clinical observations, patient history, and epidemiological information. The expected result is Negative.  Fact Sheet for Patients:  EntrepreneurPulse.com.au  Fact Sheet for Healthcare Providers:  IncredibleEmployment.be  This test is no t yet approved or cleared by the [place] FDA and  has been authorized for detection and/or diagnosis of SARS-CoV-2 by FDA under an Emergency Use Authorization (EUA). This EUA will remain  in effect (meaning this test can be used) for the duration of the COVID-19 declaration under Section 564(b)(1) of the Act, 21 U.S.C.section 360bbb-3(b)(1), unless the authorization is terminated  or revoked sooner.       Influenza A by PCR NEGATIVE NEGATIVE  Final   Influenza B by PCR NEGATIVE NEGATIVE Final    Comment: (NOTE) The Xpert Xpress SARS-CoV-2/FLU/RSV plus assay is intended as an aid in the diagnosis of influenza from Nasopharyngeal swab specimens and should not be used as a sole basis for treatment. Nasal washings and aspirates are unacceptable for Xpert Xpress SARS-CoV-2/FLU/RSV testing.  Fact Sheet for Patients: EntrepreneurPulse.com.au  Fact Sheet for Healthcare Providers: IncredibleEmployment.be  This test is not yet approved or cleared by the [place] FDA and has been authorized for detection and/or diagnosis of SARS-CoV-2 by FDA under an Emergency Use Authorization (EUA). This EUA will remain in effect (  meaning this test can be used) for the duration of the COVID-19 declaration under Section 564(b)(1) of the Act, 21 U.S.C. section 360bbb-3(b)(1), unless the authorization is terminated or revoked.  Performed at [facility], [place] [address], [place], [place] [address]      Discharge Instructions:   Discharge Instructions     Call MD for:   Complete by: As directed    Severe dizziness lightheadedness or ongoing bleeding.   Diet general   Complete by: As directed    Discharge instructions   Complete by: As directed    Follow-up with your primary care physician in 1 week.  Check blood work at that time.  Take medications as prescribed.  Discuss with your primary care physician regarding iron infusion as outpatient.  Follow-up with GI as has been scheduled on 1/31.  Please take time to stand up from lying down or sitting position .  Continue to use elastic stockings at home.  Improve hydration at home.  Please do not take over-the-counter pain medication including Aleve, Motrin.  To take Tylenol for pain.   Increase activity slowly   Complete by: As directed       Allergies as of 02/20/2020   No Known Allergies      Medication List     STOP taking these  medications    HYDROcodone-acetaminophen 5-325 MG tablet Commonly known as: NORCO/VICODIN       TAKE these medications    calcium carbonate 1250 (500 Ca) MG tablet Commonly known as: OS-CAL - dosed in mg of elemental calcium Take 1 tablet by mouth every evening.   clonazePAM 1 MG tablet Commonly known as: KLONOPIN Take 1 tablet (1 mg total) by mouth daily as needed. What changed: reasons to take this   escitalopram 10 MG tablet Commonly known as: LEXAPRO TAKE 1 TABLET(10 MG) BY MOUTH DAILY What changed:   how much to take  how to take this  when to take this  additional instructions   levothyroxine 88 MCG tablet Commonly known as: SYNTHROID Take 1 tablet (88 mcg total) by mouth daily before breakfast.   Multivitamin Adults Tabs Take 1 tablet by mouth every evening.   Oxycodone HCl 10 MG Tabs Take 1 tablet (10 mg total) by mouth every 6 (six) hours as needed for moderate pain or severe pain.   pantoprazole 40 MG tablet Commonly known as: PROTONIX Take 1 tablet (40 mg total) by mouth 2 (two) times daily for 60 days, THEN 1 tablet (40 mg total) daily. Start taking on: February 20, 2020   polyethylene glycol 17 g packet Commonly known as: MIRALAX / GLYCOLAX Take 17 g by mouth daily as needed for mild constipation or moderate constipation.   predniSONE 5 MG tablet Commonly known as: DELTASONE Take 1 tablet (5 mg total) by mouth daily with breakfast.   sucralfate 1 GM/10ML suspension Commonly known as: CARAFATE Take 10 mLs (1 g total) by mouth 4 (four) times daily -  with meals and at bedtime.   tamsulosin 0.4 MG Caps capsule Commonly known as: FLOMAX TAKE 1 CAPSULE(0.4 MG) BY MOUTH DAILY AFTER SUPPER What changed: See the new instructions.   Vitamin D 50 MCG (2000 UT) tablet Take 1 tablet (2,000 Units total) by mouth daily.        Follow-up Information     Esterwood, Amy S, PA-C Follow up on 03/04/2020.   Specialty: Gastroenterology Why: 11:30 AM  follow-up of ulcers and anemia. Contact information: [address]  [place] [address] [phone]         [facility] Follow up on 02/29/2020.   Why: Go to lab 1/27 for lab work which will be reviewed at office visit the next week. Lab is open 730 to 5 PM but may want to call to check and verify the hours. Contact information: [place] [phone]                 Time coordinating discharge: 39 minutes  Signed:  Marcha Licklider  [facility] 02/20/2020, 11:01 AM

## 2020-02-21 DIAGNOSIS — E039 Hypothyroidism, unspecified: Secondary | ICD-10-CM | POA: Diagnosis not present

## 2020-02-21 DIAGNOSIS — S72002D Fracture of unspecified part of neck of left femur, subsequent encounter for closed fracture with routine healing: Secondary | ICD-10-CM | POA: Diagnosis not present

## 2020-02-21 DIAGNOSIS — S32810D Multiple fractures of pelvis with stable disruption of pelvic ring, subsequent encounter for fracture with routine healing: Secondary | ICD-10-CM | POA: Diagnosis not present

## 2020-02-21 DIAGNOSIS — E2740 Unspecified adrenocortical insufficiency: Secondary | ICD-10-CM | POA: Diagnosis not present

## 2020-02-21 DIAGNOSIS — E230 Hypopituitarism: Secondary | ICD-10-CM | POA: Diagnosis not present

## 2020-02-21 DIAGNOSIS — M47816 Spondylosis without myelopathy or radiculopathy, lumbar region: Secondary | ICD-10-CM | POA: Diagnosis not present

## 2020-02-21 LAB — H PYLORI, IGM, IGG, IGA AB
H Pylori IgG: 0.23 Index Value (ref 0.00–0.79)
H. Pylogi, Iga Abs: <9 units (ref 0.0–8.9)
H. Pylogi, Igm Abs: <9 units (ref 0.0–8.9)

## 2020-02-22 ENCOUNTER — Other Ambulatory Visit: Payer: Self-pay | Admitting: *Deleted

## 2020-02-22 ENCOUNTER — Encounter: Payer: Self-pay | Admitting: Internal Medicine

## 2020-02-23 ENCOUNTER — Encounter: Payer: Self-pay | Admitting: *Deleted

## 2020-02-26 ENCOUNTER — Other Ambulatory Visit: Payer: Self-pay

## 2020-02-26 ENCOUNTER — Inpatient Hospital Stay (HOSPITAL_COMMUNITY)
Admission: EM | Admit: 2020-02-26 | Discharge: 2020-02-29 | DRG: 377 | Disposition: A | Discharge status: Home Health | Payer: Medicare Other | Attending: Internal Medicine | Admitting: Internal Medicine

## 2020-02-26 ENCOUNTER — Inpatient Hospital Stay (HOSPITAL_COMMUNITY): Payer: Medicare Other | Admitting: Anesthesiology

## 2020-02-26 ENCOUNTER — Encounter (HOSPITAL_COMMUNITY)
Admission: EM | Disposition: A | Discharge status: Home Health | Payer: Self-pay | Source: Home / Self Care | Attending: Internal Medicine

## 2020-02-26 ENCOUNTER — Encounter (HOSPITAL_COMMUNITY): Payer: Self-pay | Admitting: Pulmonary Disease

## 2020-02-26 DIAGNOSIS — Z87891 Personal history of nicotine dependence: Secondary | ICD-10-CM

## 2020-02-26 DIAGNOSIS — R58 Hemorrhage, not elsewhere classified: Secondary | ICD-10-CM | POA: Diagnosis not present

## 2020-02-26 DIAGNOSIS — E861 Hypovolemia: Secondary | ICD-10-CM | POA: Diagnosis not present

## 2020-02-26 DIAGNOSIS — R0902 Hypoxemia: Secondary | ICD-10-CM | POA: Diagnosis not present

## 2020-02-26 DIAGNOSIS — Z79899 Other long term (current) drug therapy: Secondary | ICD-10-CM

## 2020-02-26 DIAGNOSIS — N400 Enlarged prostate without lower urinary tract symptoms: Secondary | ICD-10-CM | POA: Diagnosis present

## 2020-02-26 DIAGNOSIS — C439 Malignant melanoma of skin, unspecified: Secondary | ICD-10-CM | POA: Diagnosis present

## 2020-02-26 DIAGNOSIS — D696 Thrombocytopenia, unspecified: Secondary | ICD-10-CM | POA: Diagnosis not present

## 2020-02-26 DIAGNOSIS — E273 Drug-induced adrenocortical insufficiency: Secondary | ICD-10-CM | POA: Diagnosis present

## 2020-02-26 DIAGNOSIS — I9589 Other hypotension: Secondary | ICD-10-CM | POA: Diagnosis not present

## 2020-02-26 DIAGNOSIS — R54 Age-related physical debility: Secondary | ICD-10-CM | POA: Diagnosis present

## 2020-02-26 DIAGNOSIS — Z7952 Long term (current) use of systemic steroids: Secondary | ICD-10-CM

## 2020-02-26 DIAGNOSIS — K922 Gastrointestinal hemorrhage, unspecified: Secondary | ICD-10-CM | POA: Diagnosis present

## 2020-02-26 DIAGNOSIS — D62 Acute posthemorrhagic anemia: Secondary | ICD-10-CM | POA: Diagnosis not present

## 2020-02-26 DIAGNOSIS — D509 Iron deficiency anemia, unspecified: Secondary | ICD-10-CM | POA: Diagnosis present

## 2020-02-26 DIAGNOSIS — Z8719 Personal history of other diseases of the digestive system: Secondary | ICD-10-CM | POA: Diagnosis present

## 2020-02-26 DIAGNOSIS — T451X5A Adverse effect of antineoplastic and immunosuppressive drugs, initial encounter: Secondary | ICD-10-CM | POA: Diagnosis present

## 2020-02-26 DIAGNOSIS — F32A Depression, unspecified: Secondary | ICD-10-CM | POA: Diagnosis present

## 2020-02-26 DIAGNOSIS — Z8249 Family history of ischemic heart disease and other diseases of the circulatory system: Secondary | ICD-10-CM

## 2020-02-26 DIAGNOSIS — K862 Cyst of pancreas: Secondary | ICD-10-CM | POA: Diagnosis present

## 2020-02-26 DIAGNOSIS — K920 Hematemesis: Secondary | ICD-10-CM | POA: Diagnosis not present

## 2020-02-26 DIAGNOSIS — Z808 Family history of malignant neoplasm of other organs or systems: Secondary | ICD-10-CM | POA: Diagnosis not present

## 2020-02-26 DIAGNOSIS — K254 Chronic or unspecified gastric ulcer with hemorrhage: Secondary | ICD-10-CM | POA: Diagnosis not present

## 2020-02-26 DIAGNOSIS — Z803 Family history of malignant neoplasm of breast: Secondary | ICD-10-CM

## 2020-02-26 DIAGNOSIS — I10 Essential (primary) hypertension: Secondary | ICD-10-CM | POA: Diagnosis present

## 2020-02-26 DIAGNOSIS — K921 Melena: Secondary | ICD-10-CM | POA: Diagnosis not present

## 2020-02-26 DIAGNOSIS — S32592D Other specified fracture of left pubis, subsequent encounter for fracture with routine healing: Secondary | ICD-10-CM

## 2020-02-26 DIAGNOSIS — F419 Anxiety disorder, unspecified: Secondary | ICD-10-CM | POA: Diagnosis present

## 2020-02-26 DIAGNOSIS — Z79890 Hormone replacement therapy (postmenopausal): Secondary | ICD-10-CM

## 2020-02-26 DIAGNOSIS — E8809 Other disorders of plasma-protein metabolism, not elsewhere classified: Secondary | ICD-10-CM | POA: Diagnosis present

## 2020-02-26 DIAGNOSIS — K5790 Diverticulosis of intestine, part unspecified, without perforation or abscess without bleeding: Secondary | ICD-10-CM | POA: Diagnosis present

## 2020-02-26 DIAGNOSIS — C7931 Secondary malignant neoplasm of brain: Secondary | ICD-10-CM | POA: Diagnosis not present

## 2020-02-26 DIAGNOSIS — Z9884 Bariatric surgery status: Secondary | ICD-10-CM

## 2020-02-26 DIAGNOSIS — Z20822 Contact with and (suspected) exposure to covid-19: Secondary | ICD-10-CM | POA: Diagnosis present

## 2020-02-26 DIAGNOSIS — Z800 Family history of malignant neoplasm of digestive organs: Secondary | ICD-10-CM

## 2020-02-26 DIAGNOSIS — R001 Bradycardia, unspecified: Secondary | ICD-10-CM | POA: Diagnosis not present

## 2020-02-26 DIAGNOSIS — K250 Acute gastric ulcer with hemorrhage: Secondary | ICD-10-CM | POA: Diagnosis not present

## 2020-02-26 DIAGNOSIS — K284 Chronic or unspecified gastrojejunal ulcer with hemorrhage: Secondary | ICD-10-CM | POA: Diagnosis not present

## 2020-02-26 DIAGNOSIS — E669 Obesity, unspecified: Secondary | ICD-10-CM | POA: Diagnosis present

## 2020-02-26 DIAGNOSIS — Z6829 Body mass index (BMI) 29.0-29.9, adult: Secondary | ICD-10-CM

## 2020-02-26 DIAGNOSIS — K2100 Gastro-esophageal reflux disease with esophagitis, without bleeding: Secondary | ICD-10-CM | POA: Diagnosis present

## 2020-02-26 DIAGNOSIS — F418 Other specified anxiety disorders: Secondary | ICD-10-CM | POA: Diagnosis not present

## 2020-02-26 DIAGNOSIS — E538 Deficiency of other specified B group vitamins: Secondary | ICD-10-CM | POA: Diagnosis present

## 2020-02-26 DIAGNOSIS — R578 Other shock: Secondary | ICD-10-CM | POA: Diagnosis not present

## 2020-02-26 DIAGNOSIS — I959 Hypotension, unspecified: Secondary | ICD-10-CM | POA: Diagnosis not present

## 2020-02-26 DIAGNOSIS — E039 Hypothyroidism, unspecified: Secondary | ICD-10-CM | POA: Diagnosis present

## 2020-02-26 DIAGNOSIS — R1111 Vomiting without nausea: Secondary | ICD-10-CM | POA: Diagnosis not present

## 2020-02-26 HISTORY — PX: HOT HEMOSTASIS: SHX5433

## 2020-02-26 HISTORY — PX: HEMOSTASIS CONTROL: SHX6838

## 2020-02-26 HISTORY — PX: ESOPHAGOGASTRODUODENOSCOPY (EGD) WITH PROPOFOL: SHX5813

## 2020-02-26 LAB — CBC
HCT: 30.9 % — ABNORMAL LOW (ref 39.0–52.0)
Hemoglobin: 10.2 g/dL — ABNORMAL LOW (ref 13.0–17.0)
MCH: 31.2 pg (ref 26.0–34.0)
MCHC: 33 g/dL (ref 30.0–36.0)
MCV: 94.5 fL (ref 80.0–100.0)
Platelets: 150 10*3/uL (ref 150–400)
RBC: 3.27 MIL/uL — ABNORMAL LOW (ref 4.22–5.81)
RDW: 16.1 % — ABNORMAL HIGH (ref 11.5–15.5)
WBC: 9 10*3/uL (ref 4.0–10.5)
nRBC: 0 % (ref 0.0–0.2)

## 2020-02-26 LAB — PROTIME-INR
INR: 1.3 — ABNORMAL HIGH (ref 0.8–1.2)
INR: 1.2 (ref 0.8–1.2)
Prothrombin Time: 15.2 seconds (ref 11.4–15.2)
Prothrombin Time: 14.3 seconds (ref 11.4–15.2)

## 2020-02-26 LAB — CBC WITH DIFFERENTIAL/PLATELET
Abs Immature Granulocytes: 0.04 10*3/uL (ref 0.00–0.07)
Basophils Absolute: 0 10*3/uL (ref 0.0–0.1)
Basophils Relative: 0 %
Eosinophils Absolute: 0.2 10*3/uL (ref 0.0–0.5)
Eosinophils Relative: 2 %
HCT: 20.4 % — ABNORMAL LOW (ref 39.0–52.0)
Hemoglobin: 5.9 g/dL — CL (ref 13.0–17.0)
Immature Granulocytes: 1 %
Lymphocytes Relative: 37 %
Lymphs Abs: 2.5 10*3/uL (ref 0.7–4.0)
MCH: 29.8 pg (ref 26.0–34.0)
MCHC: 28.9 g/dL — ABNORMAL LOW (ref 30.0–36.0)
MCV: 103 fL — ABNORMAL HIGH (ref 80.0–100.0)
Monocytes Absolute: 0.6 10*3/uL (ref 0.1–1.0)
Monocytes Relative: 9 %
Neutro Abs: 3.4 10*3/uL (ref 1.7–7.7)
Neutrophils Relative %: 51 %
Platelets: 199 10*3/uL (ref 150–400)
RBC: 1.98 MIL/uL — ABNORMAL LOW (ref 4.22–5.81)
RDW: 17.9 % — ABNORMAL HIGH (ref 11.5–15.5)
WBC: 6.7 10*3/uL (ref 4.0–10.5)
nRBC: 0 % (ref 0.0–0.2)

## 2020-02-26 LAB — COMPREHENSIVE METABOLIC PANEL
ALT: 11 U/L (ref 0–44)
AST: 18 U/L (ref 15–41)
Albumin: 1.8 g/dL — ABNORMAL LOW (ref 3.5–5.0)
Alkaline Phosphatase: 79 U/L (ref 38–126)
Anion gap: 9 (ref 5–15)
BUN: 13 mg/dL (ref 8–23)
CO2: 20 mmol/L — ABNORMAL LOW (ref 22–32)
Calcium: 7.4 mg/dL — ABNORMAL LOW (ref 8.9–10.3)
Chloride: 111 mmol/L (ref 98–111)
Creatinine, Ser: 1.15 mg/dL (ref 0.61–1.24)
GFR, Estimated: >60 mL/min (ref >60)
Glucose, Bld: 182 mg/dL — ABNORMAL HIGH (ref 70–99)
Potassium: 4 mmol/L (ref 3.5–5.1)
Sodium: 140 mmol/L (ref 135–145)
Total Bilirubin: 0.5 mg/dL (ref 0.3–1.2)
Total Protein: 3.3 g/dL — ABNORMAL LOW (ref 6.5–8.1)

## 2020-02-26 LAB — I-STAT CHEM 8, ED
BUN: 13 mg/dL (ref 8–23)
Calcium, Ion: 1.1 mmol/L — ABNORMAL LOW (ref 1.15–1.40)
Chloride: 108 mmol/L (ref 98–111)
Creatinine, Ser: 1.1 mg/dL (ref 0.61–1.24)
Glucose, Bld: 175 mg/dL — ABNORMAL HIGH (ref 70–99)
HCT: 18 % — ABNORMAL LOW (ref 39.0–52.0)
Hemoglobin: 6.1 g/dL — CL (ref 13.0–17.0)
Potassium: 3.9 mmol/L (ref 3.5–5.1)
Sodium: 141 mmol/L (ref 135–145)
TCO2: 20 mmol/L — ABNORMAL LOW (ref 22–32)

## 2020-02-26 LAB — SARS CORONAVIRUS 2 BY RT PCR (HOSPITAL ORDER, PERFORMED IN ~~LOC~~ HOSPITAL LAB): SARS Coronavirus 2: NEGATIVE

## 2020-02-26 LAB — MRSA PCR SCREENING: MRSA by PCR: NEGATIVE

## 2020-02-26 LAB — PREPARE RBC (CROSSMATCH)

## 2020-02-26 LAB — HEMOGLOBIN AND HEMATOCRIT, BLOOD
HCT: 28 % — ABNORMAL LOW (ref 39.0–52.0)
Hemoglobin: 9.6 g/dL — ABNORMAL LOW (ref 13.0–17.0)

## 2020-02-26 SURGERY — ESOPHAGOGASTRODUODENOSCOPY (EGD) WITH PROPOFOL
Anesthesia: General

## 2020-02-26 MED ORDER — TRANEXAMIC ACID-NACL 1000-0.7 MG/100ML-% IV SOLN
1000.0000 mg | INTRAVENOUS | Status: AC
  Administered 2020-02-26: 1000 mg via INTRAVENOUS
  Filled 2020-02-26: qty 100

## 2020-02-26 MED ORDER — FENTANYL CITRATE (PF) 250 MCG/5ML IJ SOLN
INTRAMUSCULAR | Status: DC | PRN
  Administered 2020-02-26: 50 ug via INTRAVENOUS

## 2020-02-26 MED ORDER — CITALOPRAM HYDROBROMIDE 20 MG PO TABS
10.0000 mg | ORAL_TABLET | Freq: Every day | ORAL | Status: DC
  Administered 2020-02-26 – 2020-02-29 (×4): 10 mg via ORAL
  Filled 2020-02-26 (×4): qty 1

## 2020-02-26 MED ORDER — EPINEPHRINE 1 MG/10ML IJ SOSY
PREFILLED_SYRINGE | INTRAMUSCULAR | Status: AC
  Filled 2020-02-26: qty 10

## 2020-02-26 MED ORDER — CHLORHEXIDINE GLUCONATE CLOTH 2 % EX PADS
6.0000 | MEDICATED_PAD | Freq: Every day | CUTANEOUS | Status: DC
  Administered 2020-02-26 – 2020-02-27 (×2): 6 via TOPICAL

## 2020-02-26 MED ORDER — PROPOFOL 10 MG/ML IV BOLUS
INTRAVENOUS | Status: DC | PRN
  Administered 2020-02-26: 150 mg via INTRAVENOUS

## 2020-02-26 MED ORDER — POLYETHYLENE GLYCOL 3350 17 G PO PACK: 17.0000 g | PACK | Freq: Every day | ORAL | Status: DC | PRN

## 2020-02-26 MED ORDER — SUCCINYLCHOLINE CHLORIDE 200 MG/10ML IV SOSY
PREFILLED_SYRINGE | INTRAVENOUS | Status: DC | PRN
  Administered 2020-02-26: 120 mg via INTRAVENOUS

## 2020-02-26 MED ORDER — DEXAMETHASONE SODIUM PHOSPHATE 10 MG/ML IJ SOLN
INTRAMUSCULAR | Status: DC | PRN
  Administered 2020-02-26: 10 mg via INTRAVENOUS

## 2020-02-26 MED ORDER — EPHEDRINE SULFATE-NACL 50-0.9 MG/10ML-% IV SOSY
PREFILLED_SYRINGE | INTRAVENOUS | Status: DC | PRN
  Administered 2020-02-26: 10 mg via INTRAVENOUS

## 2020-02-26 MED ORDER — OXYCODONE HCL 5 MG PO TABS: 10.0000 mg | ORAL_TABLET | Freq: Four times a day (QID) | ORAL | Status: DC | PRN

## 2020-02-26 MED ORDER — PANTOPRAZOLE SODIUM 40 MG IV SOLR: 40.0000 mg | Freq: Two times a day (BID) | INTRAVENOUS | Status: DC

## 2020-02-26 MED ORDER — ROCURONIUM BROMIDE 10 MG/ML (PF) SYRINGE
PREFILLED_SYRINGE | INTRAVENOUS | Status: DC | PRN
  Administered 2020-02-26: 40 mg via INTRAVENOUS

## 2020-02-26 MED ORDER — SODIUM CHLORIDE 0.9% IV SOLUTION: Freq: Once | INTRAVENOUS | Status: AC

## 2020-02-26 MED ORDER — SODIUM CHLORIDE 0.9 % IV SOLN
80.0000 mg | Freq: Once | INTRAVENOUS | Status: AC
  Administered 2020-02-26: 80 mg via INTRAVENOUS
  Filled 2020-02-26: qty 80

## 2020-02-26 MED ORDER — SUGAMMADEX SODIUM 200 MG/2ML IV SOLN
INTRAVENOUS | Status: DC | PRN
  Administered 2020-02-26 (×2): 200 mg via INTRAVENOUS

## 2020-02-26 MED ORDER — SODIUM CHLORIDE (PF) 0.9 % IJ SOLN
PREFILLED_SYRINGE | INTRAMUSCULAR | Status: DC | PRN
  Administered 2020-02-26: 3.5 mL

## 2020-02-26 MED ORDER — LEVOTHYROXINE SODIUM 88 MCG PO TABS
88.0000 ug | ORAL_TABLET | Freq: Every day | ORAL | Status: DC
  Administered 2020-02-26 – 2020-02-29 (×4): 88 ug via ORAL
  Filled 2020-02-26 (×4): qty 1

## 2020-02-26 MED ORDER — SUCRALFATE 1 GM/10ML PO SUSP
1.0000 g | Freq: Four times a day (QID) | ORAL | Status: DC
  Administered 2020-02-26 – 2020-02-29 (×12): 1 g via ORAL
  Filled 2020-02-26 (×12): qty 10

## 2020-02-26 MED ORDER — SODIUM CHLORIDE 0.9% IV SOLUTION: Freq: Once | INTRAVENOUS | Status: DC

## 2020-02-26 MED ORDER — SODIUM CHLORIDE 0.9 % IV SOLN
8.0000 mg/h | INTRAVENOUS | Status: AC
  Administered 2020-02-26 – 2020-02-27 (×4): 8 mg/h via INTRAVENOUS
  Filled 2020-02-26 (×9): qty 80

## 2020-02-26 MED ORDER — OXYCODONE HCL 5 MG PO TABS
5.0000 mg | ORAL_TABLET | Freq: Four times a day (QID) | ORAL | Status: DC | PRN
  Administered 2020-02-26 (×2): 10 mg via ORAL
  Administered 2020-02-27: 5 mg via ORAL
  Administered 2020-02-27 – 2020-02-28 (×4): 10 mg via ORAL
  Administered 2020-02-28: 5 mg via ORAL
  Administered 2020-02-28 – 2020-02-29 (×4): 10 mg via ORAL
  Filled 2020-02-26 (×12): qty 2

## 2020-02-26 MED ORDER — FENTANYL CITRATE (PF) 100 MCG/2ML IJ SOLN
INTRAMUSCULAR | Status: AC
  Filled 2020-02-26: qty 2

## 2020-02-26 MED ORDER — LACTATED RINGERS IV BOLUS
500.0000 mL | Freq: Once | INTRAVENOUS | Status: AC
  Administered 2020-02-26: 500 mL via INTRAVENOUS

## 2020-02-26 MED ORDER — ONDANSETRON HCL 4 MG/2ML IJ SOLN
INTRAMUSCULAR | Status: DC | PRN
  Administered 2020-02-26: 4 mg via INTRAVENOUS

## 2020-02-26 MED ORDER — DOCUSATE SODIUM 100 MG PO CAPS: 100.0000 mg | ORAL_CAPSULE | Freq: Two times a day (BID) | ORAL | Status: DC | PRN

## 2020-02-26 MED ORDER — LIDOCAINE 2% (20 MG/ML) 5 ML SYRINGE
INTRAMUSCULAR | Status: DC | PRN
  Administered 2020-02-26: 60 mg via INTRAVENOUS

## 2020-02-26 MED ORDER — MISOPROSTOL 100 MCG PO TABS
100.0000 ug | ORAL_TABLET | Freq: Four times a day (QID) | ORAL | Status: DC
  Administered 2020-02-26 – 2020-02-29 (×14): 100 ug via ORAL
  Filled 2020-02-26 (×16): qty 1

## 2020-02-26 MED ORDER — LACTATED RINGERS IV SOLN: INTRAVENOUS | Status: DC

## 2020-02-26 MED ORDER — PREDNISONE 5 MG PO TABS
5.0000 mg | ORAL_TABLET | Freq: Every day | ORAL | Status: DC
  Administered 2020-02-26 – 2020-02-29 (×4): 5 mg via ORAL
  Filled 2020-02-26 (×4): qty 1

## 2020-02-26 MED ORDER — HYDROCORTISONE NA SUCCINATE PF 100 MG IJ SOLR
100.0000 mg | Freq: Once | INTRAMUSCULAR | Status: AC
  Administered 2020-02-26: 100 mg via INTRAVENOUS
  Filled 2020-02-26: qty 2

## 2020-02-26 MED ORDER — ONDANSETRON HCL 4 MG/2ML IJ SOLN: 4.0000 mg | Freq: Four times a day (QID) | INTRAMUSCULAR | Status: DC | PRN

## 2020-02-26 MED ORDER — OXYCODONE HCL 5 MG PO TABS
5.0000 mg | ORAL_TABLET | Freq: Four times a day (QID) | ORAL | Status: DC | PRN
  Administered 2020-02-26: 5 mg via ORAL
  Filled 2020-02-26: qty 1

## 2020-02-26 MED ORDER — SODIUM CHLORIDE 0.9 % IV SOLN: 10.0000 mL/h | Freq: Once | INTRAVENOUS | Status: DC

## 2020-02-26 SURGICAL SUPPLY — 14 items

## 2020-02-26 NOTE — Transfer of Care (Signed)
Immediate Anesthesia Transfer of Care Note  Patient: Maxwell Fox  Procedure(s) Performed: ESOPHAGOGASTRODUODENOSCOPY (EGD) WITH PROPOFOL (N/A ) HOT HEMOSTASIS (ARGON PLASMA COAGULATION/BICAP) (N/A ) HEMOSTASIS CONTROL  Patient Location: Endoscopy Unit  Anesthesia Type:General  Level of Consciousness: awake, alert , oriented and patient cooperative  Airway & Oxygen Therapy: Patient Spontanous Breathing and Patient connected to face mask oxygen  Post-op Assessment: Report given to RN, Post -op Vital signs reviewed and stable and Patient moving all extremities  Post vital signs: Reviewed and stable  Last Vitals:  Vitals Value Taken Time  BP 142/66 02/26/20 1314  Temp    Pulse 106 02/26/20 1314  Resp 22 02/26/20 1314  SpO2 100 % 02/26/20 1314  Vitals shown include unvalidated device data.  Last Pain:  Vitals:   02/26/20 1222  TempSrc: Axillary  PainSc: 7       Patients Stated Pain Goal: 2 (38/10/17 5102)  Complications: No complications documented.

## 2020-02-26 NOTE — Op Note (Signed)
[facility] Patient Name: Maxwell Fox Procedure Date : 02/26/2020 MRN: [id number] Attending MD: Docia Chuck. Henrene Pastor , MD Date of Birth: [birth year] CSN: [id number] Age: 75 Admit Type: Inpatient Procedure:                Upper GI endoscopy with control of bleeding                            (injection, gold probe) Indications:              Hematemesis, Melena. Upper endoscopy 12 days ago                            revealed marginal ulcers in a patient with prior                            Roux-en-Y gastric bypass surgery remotely Providers:                Docia Chuck. Henrene Pastor, MD, Nelia Shi, RN,                            Doristine Johns, RN, Tyna Jaksch Technician Referring MD:             [facility] Medicines:                Monitored Anesthesia Care Complications:            No immediate complications. Estimated Blood Loss:     Estimated blood loss: none. Procedure:                Pre-Anesthesia Assessment:                           - Prior to the procedure, a History and Physical                            was performed, and patient medications and                            allergies were reviewed. The patient's tolerance of                            previous anesthesia was also reviewed. The risks                            and benefits of the procedure and the sedation                            options and risks were discussed with the patient.                            All questions were answered, and informed consent                            was obtained. Prior Anticoagulants: The patient has  taken no previous anticoagulant or antiplatelet                            agents. ASA Grade Assessment: II - A patient with                            mild systemic disease. After reviewing the risks                            and benefits, the patient was deemed in                            satisfactory condition to undergo the  procedure.                           After obtaining informed consent, the endoscope was                            passed under direct vision. Throughout the                            procedure, the patient's blood pressure, pulse, and                            oxygen saturations were monitored continuously. The                            [id number]) Olympus gastroscope was                            introduced through the mouth, and advanced to the                            jejunum. The upper GI endoscopy was accomplished                            without difficulty. The patient tolerated the                            procedure well. Scope In: Scope Out: Findings:      The esophagus was normal.      The stomach revealed altered anatomy secondary to prior Roux-en-Y       gastric bypass surgery. The gastric portion revealed normal mucosa       including the blind pouch.      One non-bleeding cratered gastric ulcer with a visible vessel was found       at the anastomosis, on the small bowel side. The lesion was 10 mm in       largest dimension. No active bleeding at the time of the examination.       Area was successfully injected with 2 mL of a 1:10,000 solution of       epinephrine for hemostasis. Coagulation for hemostasis using a 7 French       bipolar probe was successful. One of the previously noted ulcers had  healed. Size of this ulcer, was actually improved overall (except for       the presence of a visible vessel).      The examined jejunum was normal.      The previously noted suture material and staples present. Impression:               1. Anastomotic ulcer with visible vessel status                            post epinephrine injection followed by gold probe                            coagulation therapy                           2. Status post Roux-en-Y gastric bypass anatomy                           3. Overall appearance of the ulcers are improved                             from 12 days ago. One ulcer is completely healed.                            The size of the culprit lesion is smaller (despite                            visible vessel)                           4. Suture and staple material. These may be                            responsible for ischemia which may predispose to                            ulcer formation. Recommendation:           1. Clear liquid diet                           2. Continue IV PPI                           3. Carafate slurry 4 times daily                           4. Monitor for evidence of rebleeding.                           5. At some point in the relatively near future he                            will need repeat endoscopy to assess for ulcer  healing. As well, attempts at removal of suture                            material and staples could be made.                           GI will continue to follow. All the above discussed                            with the patient (who was provided a copy of this                            report). As well, I contacted his daughter [name]                            by telephone. [phone] Procedure Code(s):        --- Professional ---                           [id number], Esophagogastroduodenoscopy, flexible,                            transoral; with control of bleeding, any method Diagnosis Code(s):        --- Professional ---                           K25.4, Chronic or unspecified gastric ulcer with                            hemorrhage                           K92.0, Hematemesis                           K92.1, Melena (includes Hematochezia) CPT copyright 2019 American Medical Association. All rights reserved. The codes documented in this report are preliminary and upon coder review may  be revised to meet current compliance requirements. Docia Chuck. Henrene Pastor, MD 02/26/2020 1:29:19 PM This report has been signed  electronically. Number of Addenda: 0

## 2020-02-26 NOTE — H&P (Signed)
NAME:  Maxwell Fox, MRN:  [id number], DOB:  [birth year], LOS: 0 ADMISSION DATE:  02/26/2020, CONSULTATION DATE: 02/26/20 REFERRING MD: Dolly Rias, CHIEF COMPLAINT: GI bleeding  Brief History:  75 yo man with hx of Roux en Y (2000), hx of malignant melanoma (remission per pt) her with recurrent GI bleed.    History of Present Illness:  Sudden presyncope, hematochezia and hematemesis this evening beginning at 1230 am. Large volume bright red blood, at home and in ED.   Initial SBP 50s (? Not recorded).  Improved with Fluids, blood tx (2 u.   Also received TXA.   On Arrival Hb 5.9.   Was recently admitted (d/c 1/18) with GIB and pelvic ramus fracture after fall.  Endoscopy showed Marginal ulcers at [place] anastamosis.   No pain, no nausea, no other symptoms.   This bleeding is much worse than prior.  Has been taking protonix, 40 BID, sucralfate 4 times daily.   Has stopped all NSAID use, stopped asa use.   Past Medical History:  Metastatic melanoma (resection of brain met, immunotherapy) Roux en Y 2000 Chronic adrenal insufficiency. (prednisone 27m daily) Fracture of L superior pubic ramus BPH Depression/anxiety hypothyroidism  Significant Hospital Events:    Consults:    Procedures:    Significant Diagnostic Tests:    Micro Data:    Antimicrobials:     Objective   Blood pressure 102/62, pulse 62, temperature (!) 96.5 F (35.8 C), temperature source Axillary, resp. rate 14, SpO2 100 %.        Intake/Output Summary (Last 24 hours) at 02/26/2020 0319 Last data filed at 02/26/2020 09163Gross per 24 hour  Intake 815 ml  Output -  Net 815 ml   There were no vitals filed for this visit.  Examination: General: nad, pleasant.  HENT: NCAT, mild temporal wasting.  Lungs: CTAB Cardiovascular: rrr  Abdomen: nt, nd, nbs Extremities: scar over R shoulder. 1+ pitting edema to mid lower legs  Neuro: alert and oriented    Resolved Hospital Problem list     Assessment  & Plan:  GI bleed: suspect possible anastamosis ulcers.  On protonix gtt.  Received TXA 4 u blood ordered (on 3/4)  Folllow up CBC.  Will order FFP given volume of blood transfused.  GI requested starting misoprostol, which ed ordered.   GI to see in AM.  NPO for endoscopy.   Chronic adrenal insufficiency: cont home prednisone Hypothyroid: cont thyroid replacement.    Best practice (evaluated daily)  Diet: NPO Pain/Anxiety/Delirium protocol (if indicated):  VAP protocol (if indicated):  DVT prophylaxis: SCDs GI prophylaxis: ppi gtt Glucose control:  Mobility:  Disposition:ICU  Goals of Care:  Last date of multidisciplinary goals of care discussion: Family and staff present:  Summary of discussion:  Follow up goals of care discussion due:  Code Status: Full code.  Discussed with patient and daughter.   Labs   CBC: Recent Labs  Lab 02/20/20 0321 02/26/20 0145 02/26/20 0202  WBC 7.6 6.7  --   NEUTROABS  --  3.4  --   HGB 8.4* 5.9* 6.1*  HCT 25.9* 20.4* 18.0*  MCV 95.9 103.0*  --   PLT 170 199  --     Basic Metabolic Panel: Recent Labs  Lab 02/20/20 0321 02/26/20 0145 02/26/20 0202  NA 141 140 141  K 3.5 4.0 3.9  CL 112* 111 108  CO2 22 20*  --   GLUCOSE 71 182* 175*  BUN 13 13 13   CREATININE 0.90  1.15 1.10  CALCIUM 7.9* 7.4*  --   MG 2.0  --   --    GFR: Estimated Creatinine Clearance: 60.8 mL/min (by C-G formula based on SCr of 1.1 mg/dL). Recent Labs  Lab 02/20/20 0321 02/26/20 0145  WBC 7.6 6.7    Liver Function Tests: Recent Labs  Lab 02/26/20 0145  AST 18  ALT 11  ALKPHOS 79  BILITOT 0.5  PROT 3.3*  ALBUMIN 1.8*   No results for input(s): LIPASE, AMYLASE in the last 168 hours. No results for input(s): AMMONIA in the last 168 hours.  ABG    Component Value Date/Time   TCO2 20 (L) 02/26/2020 0202     Coagulation Profile: Recent Labs  Lab 02/26/20 0145  INR 1.3*    Cardiac Enzymes: No results for input(s): CKTOTAL,  CKMB, CKMBINDEX, TROPONINI in the last 168 hours.  HbA1C: Hgb A1c MFr Bld  Date/Time Value Ref Range Status  02/14/2020 04:20 PM 4.7 (L) 4.8 - 5.6 % Final    Comment:    (NOTE) Pre diabetes:          5.7%-6.4%  Diabetes:              >6.4%  Glycemic control for   <7.0% adults with diabetes     CBG: No results for input(s): GLUCAP in the last 168 hours.  Review of Systems:   Negative aside from HPI positives  Past Medical History:  He,  has a past medical history of B12 nutritional deficiency, BPH (benign prostatic hypertrophy), Diverticulosis, GI bleeding (02/2020), Hypertension, Iron deficiency anemia, Melanoma ([place]), Obesity, Pancreatic cyst, and Pulmonary nodules.   Surgical History:   Past Surgical History:  Procedure Laterality Date  . CRANIOTOMY FOR TUMOR  12/11/2015   metastatic melanoma  . ESOPHAGOGASTRODUODENOSCOPY (EGD) WITH PROPOFOL N/A 02/14/2020   Procedure: ESOPHAGOGASTRODUODENOSCOPY (EGD) WITH PROPOFOL;  Surgeon: Lavena Bullion, DO;  Location: [facility];  Service: Endoscopy;  Laterality: N/A;  . MOHS SURGERY  2012   melanoma, mid back, [place]  . ROUX-EN-Y PROCEDURE  2000  . TOTAL SHOULDER REPLACEMENT Right 2011   [place]     Social History:   reports that he quit smoking about 57 years ago. He has never used smokeless tobacco. He reports previous alcohol use. He reports that he does not use drugs.   Family History:  His family history includes Breast cancer in his sister; Hypertension in his father; Macular degeneration in his mother; Melanoma in his brother, mother, and sister; Stomach cancer in his sister; Uterine cancer in his sister.   Allergies No Known Allergies   Home Medications  Prior to Admission medications   Medication Sig Start Date End Date Taking? Authorizing Provider  calcium carbonate (OS-CAL - DOSED IN MG OF ELEMENTAL CALCIUM) 1250 (500 Ca) MG tablet Take 1 tablet by mouth every evening.    [provider]   Cholecalciferol (VITAMIN D) 2000 UNITS tablet Take 1 tablet (2,000 Units total) by mouth daily. 10/26/13   [name], MD  clonazePAM (KLONOPIN) 1 MG tablet Take 1 tablet (1 mg total) by mouth daily as needed. Patient taking differently: Take 1 mg by mouth daily as needed for anxiety. 01/22/20   Oswald Hillock, MD  escitalopram (LEXAPRO) 10 MG tablet TAKE 1 TABLET(10 MG) BY MOUTH DAILY Patient taking differently: Take 10 mg by mouth at bedtime. 11/23/19   [name], MD  levothyroxine (SYNTHROID) 88 MCG tablet Take 1 tablet (88 mcg total) by mouth daily  before breakfast. 06/26/19   [name], MD  Multiple Vitamins-Minerals (MULTIVITAMIN ADULTS) TABS Take 1 tablet by mouth every evening.    [provider]  oxyCODONE 10 MG TABS Take 1 tablet (10 mg total) by mouth every 6 (six) hours as needed for moderate pain or severe pain. 02/20/20   Pokhrel, Corrie Mckusick, MD  pantoprazole (PROTONIX) 40 MG tablet Take 1 tablet (40 mg total) by mouth 2 (two) times daily for 60 days, THEN 1 tablet (40 mg total) daily. 02/20/20 05/20/20  Pokhrel, Corrie Mckusick, MD  polyethylene glycol (MIRALAX / GLYCOLAX) 17 g packet Take 17 g by mouth daily as needed for mild constipation or moderate constipation. 02/20/20   Pokhrel, Corrie Mckusick, MD  predniSONE (DELTASONE) 5 MG tablet Take 1 tablet (5 mg total) by mouth daily with breakfast. 06/26/19   [name], MD  sucralfate (CARAFATE) 1 GM/10ML suspension Take 10 mLs (1 g total) by mouth 4 (four) times daily -  with meals and at bedtime. 02/20/20 04/02/20  Pokhrel, Corrie Mckusick, MD  tamsulosin (FLOMAX) 0.4 MG CAPS capsule TAKE 1 CAPSULE(0.4 MG) BY MOUTH DAILY AFTER SUPPER Patient taking differently: Take 0.4 mg by mouth daily after supper. 11/30/19   [name], MD     Critical care time: 45 min

## 2020-02-26 NOTE — ED Notes (Signed)
Pt had a very large bowel movement with bright red blood, clots, and small amount of stool

## 2020-02-26 NOTE — ED Notes (Addendum)
Pt Daughter is here

## 2020-02-26 NOTE — Anesthesia Procedure Notes (Signed)
Procedure Name: Intubation Date/Time: 02/26/2020 12:48 PM Performed by: Myna Bright, CRNA Pre-anesthesia Checklist: Patient identified, Emergency Drugs available, Suction available and Patient being monitored Patient Re-evaluated:Patient Re-evaluated prior to induction Oxygen Delivery Method: Circle system utilized Preoxygenation: Pre-oxygenation with 100% oxygen Induction Type: IV induction, Rapid sequence and Cricoid Pressure applied Laryngoscope Size: Mac and 4 Grade View: Grade I Tube type: Oral Tube size: 7.5 mm Number of attempts: 1 Airway Equipment and Method: Stylet Placement Confirmation: ETT inserted through vocal cords under direct vision,  positive ETCO2 and breath sounds checked- equal and bilateral Tube secured with: Tape Dental Injury: Teeth and Oropharynx as per pre-operative assessment

## 2020-02-26 NOTE — ED Triage Notes (Signed)
Pt from home with EMS for rectal bleeding and vomiting blood. Pt got up from bed due to feeling sick, vomited bright red blood with clots. Hypotensive with pressure in the 28M systolic. On arrival, pt still hypotensive, and pale. Alert and oriented x 3.

## 2020-02-26 NOTE — ED Provider Notes (Signed)
[facility] EMERGENCY DEPARTMENT Provider Note   CSN: [id number] Arrival date & time:        History Chief Complaint  Patient presents with  . GI Bleeding    Maxwell Fox is a 75 y.o. male.  Patient was discharged about a week ago after having been admitted for a fall with a pubic ramus fracture associated with GI bleeding from 2 marginal ulcers at the site of anastomosis for Roux-en-Y back in the day.  Patient is back here with EMS today secondary to generalized weakness and hematochezia and hematemesis.  Sound like he called EMS tonight secondary to weakness and not able to get a bed.  When they got there they noticed he had some bright red blood around his lips and then some hematemesis and there presents as well.  His blood pressure in that situation was 59/26.  IV is started fluids were administered patient was put in the ambulance and brought here for further evaluation.  In route the patient had progressively worsening hematochezia as well and continues to have it here.  Patient is awake and alert to voice here.  He states he is full code.  He states that he is just noticed the bleeding today.  Weakness as well.  No recent illnesses otherwise.  The history is provided by the patient, medical records and the EMS personnel.       Past Medical History:  Diagnosis Date  . B12 nutritional deficiency    malabsorbtion  . BPH (benign prostatic hypertrophy)   . Diverticulosis   . GI bleeding 02/2020  . Hypertension   . Iron deficiency anemia    malabsorption related (s/p gastric bypass)  . Melanoma ([place])   . Obesity    s/p gastric bypass 2000, start 355#  . Pancreatic cyst   . Pulmonary nodules     Patient Active Problem List   Diagnosis Date Noted  . Acute GI bleeding 02/14/2020  . Acute blood loss anemia 02/14/2020  . GI bleed 02/14/2020  . Melena   . Marginal ulcers   . Gastroesophageal reflux disease with esophagitis without hemorrhage   .  Closed fracture of left superior pubic ramus ([place]) 02/09/2020  . Anxiety and depression 01/10/2018  . History of antineoplastic chemotherapy 07/19/2017  . Chronic midline low back pain without sciatica 04/19/2017  . Metastatic melanoma ([place]) 01/07/2017  . Adrenal insufficiency due to cancer therapy ([place]) 07/27/2016  . Secondary hypothyroidism 07/27/2016  . H/O gastric bypass 01/06/2016  . Pancreatic cyst 01/06/2016  . Metastatic melanoma of brain 12/31/2015  . Iron deficiency anemia   . B12 nutritional deficiency   . Benign prostatic hyperplasia     Past Surgical History:  Procedure Laterality Date  . CRANIOTOMY FOR TUMOR  12/11/2015   metastatic melanoma  . ESOPHAGOGASTRODUODENOSCOPY (EGD) WITH PROPOFOL N/A 02/14/2020   Procedure: ESOPHAGOGASTRODUODENOSCOPY (EGD) WITH PROPOFOL;  Surgeon: Lavena Bullion, DO;  Location: [facility];  Service: Endoscopy;  Laterality: N/A;  . MOHS SURGERY  2012   melanoma, mid back, [place]  . ROUX-EN-Y PROCEDURE  2000  . TOTAL SHOULDER REPLACEMENT Right 2011   [place]       Family History  Problem Relation Age of Onset  . Hypertension Father   . Macular degeneration Mother   . Melanoma Mother   . Melanoma Sister   . Breast cancer Sister   . Uterine cancer Sister   . Stomach cancer Sister   . Melanoma Brother     Social  History   Tobacco Use  . Smoking status: Former Smoker    Quit date: 02/03/1963    Years since quitting: 16.1  . Smokeless tobacco: Never Used  Vaping Use  . Vaping Use: Never used  Substance Use Topics  . Alcohol use: Not Currently    Comment: wine 2x/week  . Drug use: Never    Home Medications Prior to Admission medications   Medication Sig Start Date End Date Taking? Authorizing Provider  calcium carbonate (OS-CAL - DOSED IN MG OF ELEMENTAL CALCIUM) 1250 (500 Ca) MG tablet Take 1 tablet by mouth every evening.   Yes [provider]  clonazePAM (KLONOPIN) 1 MG tablet Take 1 tablet (1 mg total)  by mouth daily as needed. Patient taking differently: Take 1 mg by mouth daily as needed for anxiety. 01/22/20  Yes [name], [name], MD  escitalopram (LEXAPRO) 10 MG tablet TAKE 1 TABLET(10 MG) BY MOUTH DAILY Patient taking differently: Take 10 mg by mouth at bedtime. 11/23/19  Yes Burns, [name], MD  levothyroxine (SYNTHROID) 88 MCG tablet Take 1 tablet (88 mcg total) by mouth daily before breakfast. 06/26/19  Yes [name], MD  Multiple Vitamins-Minerals (MULTIVITAMIN ADULTS) TABS Take 1 tablet by mouth every evening.   Yes [provider]  oxyCODONE 10 MG TABS Take 1 tablet (10 mg total) by mouth every 6 (six) hours as needed for moderate pain or severe pain. 02/20/20  Yes Pokhrel, Laxman, MD  pantoprazole (PROTONIX) 40 MG tablet Take 1 tablet (40 mg total) by mouth 2 (two) times daily for 60 days, THEN 1 tablet (40 mg total) daily. 02/20/20 05/20/20 Yes Pokhrel, Laxman, MD  polyethylene glycol (MIRALAX / GLYCOLAX) 17 g packet Take 17 g by mouth daily as needed for mild constipation or moderate constipation. 02/20/20  Yes Pokhrel, Corrie Mckusick, MD  predniSONE (DELTASONE) 5 MG tablet Take 1 tablet (5 mg total) by mouth daily with breakfast. 06/26/19  Yes [name], MD  sucralfate (CARAFATE) 1 GM/10ML suspension Take 10 mLs (1 g total) by mouth 4 (four) times daily -  with meals and at bedtime. 02/20/20 04/02/20 Yes Pokhrel, Laxman, MD  tamsulosin (FLOMAX) 0.4 MG CAPS capsule TAKE 1 CAPSULE(0.4 MG) BY MOUTH DAILY AFTER SUPPER Patient taking differently: Take 0.4 mg by mouth daily after supper. 11/30/19  Yes Burns, [name], MD  Cholecalciferol (VITAMIN D) 2000 UNITS tablet Take 1 tablet (2,000 Units total) by mouth daily. 10/26/13   [name], MD    Allergies    Patient has no known allergies.  Review of Systems   Review of Systems  Unable to perform ROS: Acuity of condition    Physical Exam Updated Vital Signs BP 103/62   Pulse 62   Temp (!) 96.5 F (35.8 C) (Axillary)    Resp 10   SpO2 100%   Physical Exam HENT:     Head: Normocephalic and atraumatic.     Nose: No congestion or rhinorrhea.     Mouth/Throat:     Mouth: Mucous membranes are moist.     Pharynx: Oropharynx is clear.  Eyes:     Pupils: Pupils are equal, round, and reactive to light.  Cardiovascular:     Rate and Rhythm: Normal rate.  Abdominal:     General: Abdomen is flat.  Musculoskeletal:        General: No swelling or tenderness. Normal range of motion.  Skin:    General: Skin is warm and dry.     Coloration: Skin  is pale.  Neurological:     General: No focal deficit present.     Mental Status: He is alert.     ED Results / Procedures / Treatments   Labs (all labs ordered are listed, but only abnormal results are displayed) Labs Reviewed  COMPREHENSIVE METABOLIC PANEL - Abnormal; Notable for the following components:      Result Value   CO2 20 (*)    Glucose, Bld 182 (*)    Calcium 7.4 (*)    Total Protein 3.3 (*)    Albumin 1.8 (*)    All other components within normal limits  CBC WITH DIFFERENTIAL/PLATELET - Abnormal; Notable for the following components:   RBC 1.98 (*)    Hemoglobin 5.9 (*)    HCT 20.4 (*)    MCV 103.0 (*)    MCHC 28.9 (*)    RDW 17.9 (*)    All other components within normal limits  PROTIME-INR - Abnormal; Notable for the following components:   INR 1.3 (*)    All other components within normal limits  I-STAT CHEM 8, ED - Abnormal; Notable for the following components:   Glucose, Bld 175 (*)    Calcium, Ion 1.10 (*)    TCO2 20 (*)    Hemoglobin 6.1 (*)    HCT 18.0 (*)    All other components within normal limits  SARS CORONAVIRUS 2 BY RT PCR (HOSPITAL ORDER, [facility])  PROTIME-INR  CBC  TYPE AND SCREEN  PREPARE RBC (CROSSMATCH)  PREPARE FRESH FROZEN PLASMA    EKG EKG Interpretation  Date/Time:  Monday February 26 2020 01:41:28 EST Ventricular Rate:  69 PR Interval:    QRS Duration: 97 QT  Interval:  401 QTC Calculation: 430 R Axis:   -19 Text Interpretation: Sinus rhythm Borderline left axis deviation Abnormal R-wave progression, early transition Borderline repolarization abnormality Confirmed by Merrily Pew [phone]) on 02/26/2020 2:03:52 AM   Radiology No results found.  Procedures .Critical Care Performed by: Merrily Pew, MD Authorized by: Merrily Pew, MD   Critical care provider statement:    Critical care time (minutes):  45   Critical care was necessary to treat or prevent imminent or life-threatening deterioration of the following conditions:  Shock and circulatory failure   Critical care was time spent personally by me on the following activities:  Discussions with consultants, evaluation of patient's response to treatment, examination of patient, ordering and performing treatments and interventions, ordering and review of laboratory studies, ordering and review of radiographic studies, pulse oximetry, re-evaluation of patient's condition, obtaining history from patient or surrogate and review of old charts   (including critical care time)  Medications Ordered in ED Medications  pantoprazole (PROTONIX) 80 mg in sodium chloride 0.9 % 100 mL (0.8 mg/mL) infusion (8 mg/hr Intravenous New Bag/Given 02/26/20 0415)  pantoprazole (PROTONIX) injection 40 mg (has no administration in time range)  misoprostol (CYTOTEC) tablet 100 mcg (100 mcg Oral Given 02/26/20 0516)  0.9 %  sodium chloride infusion (10 mL/hr Intravenous Not Given 02/26/20 0332)  0.9 %  sodium chloride infusion (Manually program via Guardrails IV Fluids) (has no administration in time range)  docusate sodium (COLACE) capsule 100 mg (has no administration in time range)  polyethylene glycol (MIRALAX / GLYCOLAX) packet 17 g (has no administration in time range)  ondansetron (ZOFRAN) injection 4 mg (has no administration in time range)  0.9 %  sodium chloride infusion (Manually program via Guardrails IV  Fluids) ( Intravenous  New Bag/Given 02/26/20 0323)  hydrocortisone sodium succinate (SOLU-CORTEF) 100 MG injection 100 mg (100 mg Intravenous Given 02/26/20 0327)  pantoprazole (PROTONIX) 80 mg in sodium chloride 0.9 % 100 mL IVPB (0 mg Intravenous Stopped 02/26/20 0416)  lactated ringers bolus 500 mL (0 mLs Intravenous Stopped 02/26/20 0230)  tranexamic acid (CYKLOKAPRON) IVPB 1,000 mg (0 mg Intravenous Stopped 02/26/20 0346)    ED Course  I have reviewed the triage vital signs and the nursing notes.  Pertinent labs & imaging results that were available during my care of the patient were reviewed by me and considered in my medical decision making (see chart for details).    MDM Rules/Calculators/A&P                          Emergent release blood was given immediately with concern for symptomatic anemia secondary to hemorrhagic shock from likely marginal ulcers.  He is on chronic steroids and hypotensive so Solu-Cortef given as well along with fluids.  Patient is full code.  Right now is protecting his airway.  Will consult GI.  0225: d/w Dr. Silverio Decamp with [facility] who recommended misoprostol but otherwise supportive care. They will see in the AM. Did not think tRBC scan would be helpful.   TXA also ordered to help slow down the bleeding.  Patient is getting his third unit of blood.  His hemoglobin is 5.9 by suspect is probably slightly lower with his low blood pressure.  His hematochezia seems to be slowing down some.  Consult intensivist for admission.  Discussed with intensivist, will see for admission or as consult. Patient stabilizing.  Final Clinical Impression(s) / ED Diagnoses Final diagnoses:  Hemorrhagic shock [facility])  Gastrointestinal hemorrhage with hematemesis    Rx / DC Orders ED Discharge Orders    None       Jazari Ober, Corene Cornea, MD 02/26/20 [phone]

## 2020-02-26 NOTE — Anesthesia Preprocedure Evaluation (Addendum)
Anesthesia Evaluation  Patient identified by MRN, date of birth, ID band Patient awake    Reviewed: Allergy & Precautions, NPO status , Patient's Chart, lab work & pertinent test results  History of Anesthesia Complications Negative for: history of anesthetic complications  Airway Mallampati: II  TM Distance: >3 FB Neck ROM: Full    Dental  (+) Dental Advisory Given, Teeth Intact   Pulmonary former smoker,    Pulmonary exam normal        Cardiovascular hypertension, Normal cardiovascular exam   '20 TTE - EF 50%, mild concentric LVH. Mild MR and TR. Mild AV sclerosis without stenosis    Neuro/Psych PSYCHIATRIC DISORDERS Anxiety Depression negative neurological ROS     GI/Hepatic Neg liver ROS, GERD  Medicated and Controlled, S/p gastric bypass    Endo/Other  Hypothyroidism  Adrenal insufficiency d/t chemotherapy Hypocalcemia   Renal/GU negative Renal ROS     Musculoskeletal negative musculoskeletal ROS (+)   Abdominal   Peds  Hematology  (+) anemia ,  INR 1.3    Anesthesia Other Findings Covid test negative   Reproductive/Obstetrics                            Anesthesia Physical Anesthesia Plan  ASA: III  Anesthesia Plan: General   Post-op Pain Management:    Induction: Intravenous and Rapid sequence  PONV Risk Score and Plan: 2 and Treatment may vary due to age or medical condition and Ondansetron  Airway Management Planned: Oral ETT  Additional Equipment: None  Intra-op Plan:   Post-operative Plan: Extubation in OR  Informed Consent: I have reviewed the patients History and Physical, chart, labs and discussed the procedure including the risks, benefits and alternatives for the proposed anesthesia with the patient or authorized representative who has indicated his/her understanding and acceptance.     Dental advisory given  Plan Discussed with: CRNA and  Anesthesiologist  Anesthesia Plan Comments:        Anesthesia Quick Evaluation

## 2020-02-26 NOTE — Progress Notes (Signed)
  PCCM Interval Note  68 yoM admitted this morning with hematochezia and hematemesis overnight and Hgb of 5.9 s/p 3 units of PRBC and finishing 1 unit FFP now   Remains hemodynamically stable and on room air No further episodes of vomiting/ bowel movements in ICU Remains on PPI gtt [facility] has seen with plans for EGD today.  Remains NPO.   Pending H/H now.   Will reassess post EGD.     Kennieth Rad, ACNP [facility] 02/26/2020, 10:18 AM

## 2020-02-26 NOTE — ED Notes (Signed)
Verbal consent for blood transfusion obtained with Dr.Mesner and pt

## 2020-02-26 NOTE — Consult Note (Addendum)
Referring Provider:  [facility]         Primary Care Physician:  [name], MD Primary Gastroenterologist:  Zenovia Jarred, MD            We were asked to see this patient for:      GI bleed            ASSESSMENT / PLAN:    # 75 yo male with history of remote Roux-en-Y here with recurrent GI bleeding and recurrent decline in hgb. He presented with painless hematemesis and hematocheia. Recent EGD for bleeding remarkable for non-bleeding marginal ulcers in setting of NSAIDs. Current bleeding could be from anastomotic uclers. He has been compliant with PPI, carafate and NSAID avoidance. H.pylori IgG negative.  --Patient will need repeat EGD. The risks and benefits of EGD were discussed and the patient agrees to proceed. Keep NPO except sips with meds for now.  --Continue PPI infusion  # Fracture of left superior pubic ramus ( recent admission)   # Severe hypoalbuminemia  # Metastatic melanoma with resection of brain met and immunotherapy. Followed by [facility]. CT scan Feb 2021, no definite evidence of metastatic disease.   # IPMN, stable on CT scan Feb 2021 at [facility]  # Chronic adrenal insufficiency.      HPI:                                                                                                                             Chief Complaint: GI bleeding  Maxwell Fox is a 75 y.o. male with PMH significant for remote Roux-en-Y, metastatic melanoma, HTN, adrenal insufficiency, IPMN, hypothyroidism, diverticulosis Patient hospitalized earlier this month with weakness and a fall resulting in a pubic ramus fracture. We evaluated him for Heme+ anemia. Hgb was down several grams compared to the preceding month.Inpaient EGD on 02/14/20 remarkable for esophagitis, Roux-en-y anatomy, anastomotic ulcers. No active bleeding. He had been taking NSAIDS and prednisone.H.pylori IgG was negative.  He was advised to take BID PPI, Carafate x 6 weeks and have follow up EGD ion 8 weeks.  Recommended that he discontinue NSAIDS He received a unit of blood and IV iron infusion.  Discharged on 02/20/20 with a hgb of 8.4   Patient brought back to hospital during the night with weakness, hematemesis and hematochezia. He became nauseated around midnight, vomited red blood a few times and this was followed by onset of hematochezia. No associated abdominal apin. He has been compliant with PPI, carafafe and NSAID avoidance. His hgb was 5.9. BUN 13. He was hypotensive and has hx of chronic adrenal insuff so steroids given along with IVF. He has received 3 units of blood and FFP. Admitted PCCM. Post tranfusion hgb hasn't resulted. Patient says he feel much better after getting blood    PREVIOUS ENDOSCOPIC EVALUATIONS / PERTINENT STUDIES   02/14/20 EGD for Heme+ anemia  LA Grade B (one or more mucosal breaks greater than 5 mm, not  extending between the tops of two mucosal folds) esophagitis with no bleeding was found in the lower third of the esophagus. The upper and middle esophagus were otherwise normal. Findings: Evidence of a Roux-en-Y gastrojejunostomy was found. The gastrojejunal anastomosis was characterized by 2 deep marginal ulcers. There were visible staples and 1 suture noted. There was no active bleeding and no high grade stigmata that required endoscopic intervention. The anastamosis was easily traversed.  Reportedly normal colonoscopy 6 years ago ( in [place])  Past Medical History:  Diagnosis Date  . B12 nutritional deficiency    malabsorbtion  . BPH (benign prostatic hypertrophy)   . Diverticulosis   . GI bleeding 02/2020  . Hypertension   . Iron deficiency anemia    malabsorption related (s/p gastric bypass)  . Melanoma ([place])   . Obesity    s/p gastric bypass 2000, start 355#  . Pancreatic cyst   . Pulmonary nodules     Past Surgical History:  Procedure Laterality Date  . CRANIOTOMY FOR TUMOR  12/11/2015   metastatic melanoma  . ESOPHAGOGASTRODUODENOSCOPY  (EGD) WITH PROPOFOL N/A 02/14/2020   Procedure: ESOPHAGOGASTRODUODENOSCOPY (EGD) WITH PROPOFOL;  Surgeon: Lavena Bullion, DO;  Location: [facility];  Service: Endoscopy;  Laterality: N/A;  . MOHS SURGERY  2012   melanoma, mid back, [place]  . ROUX-EN-Y PROCEDURE  2000  . TOTAL SHOULDER REPLACEMENT Right 2011   [place]    Prior to Admission medications   Medication Sig Start Date End Date Taking? Authorizing Provider  calcium carbonate (OS-CAL - DOSED IN MG OF ELEMENTAL CALCIUM) 1250 (500 Ca) MG tablet Take 1 tablet by mouth every evening.   Yes [provider]  clonazePAM (KLONOPIN) 1 MG tablet Take 1 tablet (1 mg total) by mouth daily as needed. Patient taking differently: Take 1 mg by mouth daily as needed for anxiety. 01/22/20  Yes [name], [name], MD  escitalopram (LEXAPRO) 10 MG tablet TAKE 1 TABLET(10 MG) BY MOUTH DAILY Patient taking differently: Take 10 mg by mouth at bedtime. 11/23/19  Yes Burns, [name], MD  levothyroxine (SYNTHROID) 88 MCG tablet Take 1 tablet (88 mcg total) by mouth daily before breakfast. 06/26/19  Yes [name], MD  Multiple Vitamins-Minerals (MULTIVITAMIN ADULTS) TABS Take 1 tablet by mouth every evening.   Yes [provider]  oxyCODONE 10 MG TABS Take 1 tablet (10 mg total) by mouth every 6 (six) hours as needed for moderate pain or severe pain. 02/20/20  Yes Pokhrel, Laxman, MD  pantoprazole (PROTONIX) 40 MG tablet Take 1 tablet (40 mg total) by mouth 2 (two) times daily for 60 days, THEN 1 tablet (40 mg total) daily. 02/20/20 05/20/20 Yes Pokhrel, Laxman, MD  polyethylene glycol (MIRALAX / GLYCOLAX) 17 g packet Take 17 g by mouth daily as needed for mild constipation or moderate constipation. 02/20/20  Yes Pokhrel, Corrie Mckusick, MD  predniSONE (DELTASONE) 5 MG tablet Take 1 tablet (5 mg total) by mouth daily with breakfast. 06/26/19  Yes [name], MD  sucralfate (CARAFATE) 1 GM/10ML suspension Take 10 mLs (1 g total) by mouth 4  (four) times daily -  with meals and at bedtime. 02/20/20 04/02/20 Yes Pokhrel, Laxman, MD  tamsulosin (FLOMAX) 0.4 MG CAPS capsule TAKE 1 CAPSULE(0.4 MG) BY MOUTH DAILY AFTER SUPPER Patient taking differently: Take 0.4 mg by mouth daily after supper. 11/30/19  Yes Burns, [name], MD  Cholecalciferol (VITAMIN D) 2000 UNITS tablet Take 1 tablet (2,000 Units total) by mouth daily. 10/26/13   Asa Lente,  Jannifer Rodney, MD    Current Facility-Administered Medications  Medication Dose Route Frequency Provider Last Rate Last Admin  . 0.9 %  sodium chloride infusion (Manually program via Guardrails IV Fluids)   Intravenous Once Collier Bullock, MD      . 0.9 %  sodium chloride infusion  10 mL/hr Intravenous Once Mesner, Corene Cornea, MD      . citalopram (CELEXA) tablet 10 mg  10 mg Oral Daily Collier Bullock, MD      . docusate sodium (COLACE) capsule 100 mg  100 mg Oral BID PRN Collier Bullock, MD      . levothyroxine (SYNTHROID) tablet 88 mcg  88 mcg Oral Q0600 Collier Bullock, MD      . misoprostol (CYTOTEC) tablet 100 mcg  100 mcg Oral Q6H Mesner, Corene Cornea, MD   100 mcg at 02/26/20 0516  . ondansetron (ZOFRAN) injection 4 mg  4 mg Intravenous Q6H PRN Collier Bullock, MD      . pantoprazole (PROTONIX) 80 mg in sodium chloride 0.9 % 100 mL (0.8 mg/mL) infusion  8 mg/hr Intravenous Continuous Mesner, Jason, MD 10 mL/hr at 02/26/20 0415 8 mg/hr at 02/26/20 0415  . [START ON 02/29/2020] pantoprazole (PROTONIX) injection 40 mg  40 mg Intravenous Q12H Mesner, Jason, MD      . polyethylene glycol (MIRALAX / GLYCOLAX) packet 17 g  17 g Oral Daily PRN Collier Bullock, MD      . predniSONE (DELTASONE) tablet 5 mg  5 mg Oral Q breakfast Collier Bullock, MD        Allergies as of 02/26/2020  . (No Known Allergies)    Family History  Problem Relation Age of Onset  . Hypertension Father   . Macular degeneration Mother   . Melanoma Mother   . Melanoma Sister   . Breast cancer Sister   . Uterine cancer Sister   .  Stomach cancer Sister   . Melanoma Brother     Social History   Socioeconomic History  . Marital status: Married    Spouse name: Not on file  . Number of children: 2  . Years of education: Not on file  . Highest education level: Not on file  Occupational History  . Not on file  Tobacco Use  . Smoking status: Former Smoker    Quit date: 02/03/1963    Years since quitting: 64.1  . Smokeless tobacco: Never Used  Vaping Use  . Vaping Use: Never used  Substance and Sexual Activity  . Alcohol use: Not Currently    Comment: wine 2x/week  . Drug use: Never  . Sexual activity: Not on file  Other Topics Concern  . 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 Worried About Charity fundraiser in the Last Year: Never true  . Ran Out of Food in the Last Year: Never true  Transportation Needs: No Transportation Needs  . Lack of Transportation (Medical): No  . Lack of Transportation (Non-Medical): No  Physical Activity: Not on file  Stress: Not on file  Social Connections: Not on file  Intimate Partner Violence: Not on file    Review of Systems: All systems reviewed and negative except where noted in HPI.   OBJECTIVE:    Physical Exam: Vital signs in  last 24 hours: Temp:  [94.3 F (34.6 C)-98.3 F (36.8 C)] 98.3 F (36.8 C) (01/24 0900) Pulse Rate:  [60-76] 62 (01/24 0900) Resp:  [8-19] 13 (01/24 0900) BP: (69-114)/(51-67) 114/63 (01/24 0900) SpO2:  [99 %-100 %] 99 % (01/24 0700) Weight:  [91.6 kg] 91.6 kg (01/24 0630)   General:   Alert  male in NAD Psych:  Pleasant, cooperative. Normal mood and affect. Eyes:  Pupils equal, sclera clear, no icterus.   Conjunctiva pink. Ears:  Normal auditory acuity. Nose:  No deformity, discharge,  or lesions. Neck:  Supple; no  masses Lungs:  Clear throughout to auscultation.   No wheezes, crackles, or rhonchi.  Heart:  Regular rate and rhythm;,BLE pitting edema Abdomen:  Soft, non-distended, nontender, BS active, no palp mass   Rectal:  Deferred  Msk:  Symmetrical without gross deformities. . Neurologic:  Alert and  oriented x4;  grossly normal neurologically. Skin:  Intact without significant lesions or rashes.  Filed Weights   02/26/20 0630  Weight: 91.6 kg     Scheduled inpatient medications . sodium chloride   Intravenous Once  . citalopram  10 mg Oral Daily  . levothyroxine  88 mcg Oral Q0600  . misoprostol  100 mcg Oral Q6H  . [START ON 02/29/2020] pantoprazole  40 mg Intravenous Q12H  . predniSONE  5 mg Oral Q breakfast      Intake/Output from previous day: 01/23 0701 - 01/24 0700 In: 1130 [Blood:630; IV Piggyback:500] Out: -  Intake/Output this shift: No intake/output data recorded.   Lab Results: Recent Labs    02/26/20 0145 02/26/20 0202  WBC 6.7  --   HGB 5.9* 6.1*  HCT 20.4* 18.0*  PLT 199  --    BMET Recent Labs    02/26/20 0145 02/26/20 0202  NA 140 141  K 4.0 3.9  CL 111 108  CO2 20*  --   GLUCOSE 182* 175*  BUN 13 13  CREATININE 1.15 1.10  CALCIUM 7.4*  --    LFT Recent Labs    02/26/20 0145  PROT 3.3*  ALBUMIN 1.8*  AST 18  ALT 11  ALKPHOS 79  BILITOT 0.5   PT/INR Recent Labs    02/26/20 0145  LABPROT 15.2  INR 1.3*   Hepatitis Panel No results for input(s): HEPBSAG, HCVAB, HEPAIGM, HEPBIGM in the last 72 hours.   . CBC Latest Ref Rng & Units 02/26/2020 02/26/2020 02/20/2020  WBC 4.0 - 10.5 K/uL - 6.7 7.6  Hemoglobin 13.0 - 17.0 g/dL 6.1(LL) 5.9(LL) 8.4(L)  Hematocrit 39.0 - 52.0 % 18.0(L) 20.4(L) 25.9(L)  Platelets 150 - 400 K/uL - 199 170    . CMP Latest Ref Rng & Units 02/26/2020 02/26/2020 02/20/2020  Glucose 70 - 99 mg/dL 175(H) 182(H) 71  BUN 8 - 23 mg/dL 13 13 13   Creatinine 0.61 - 1.24 mg/dL 1.10 1.15 0.90  Sodium 135 - 145  mmol/L 141 140 141  Potassium 3.5 - 5.1 mmol/L 3.9 4.0 3.5  Chloride 98 - 111 mmol/L 108 111 112(H)  CO2 22 - 32 mmol/L - 20(L) 22  Calcium 8.9 - 10.3 mg/dL - 7.4(L) 7.9(L)  Total Protein 6.5 - 8.1 g/dL - 3.3(L) -  Total Bilirubin 0.3 - 1.2 mg/dL - 0.5 -  Alkaline Phos 38 - 126 U/L - 79 -  AST 15 - 41 U/L - 18 -  ALT 0 - 44 U/L - 11 -   Studies/Results: No results found.  Active Problems:   GI  bleed    Tye Savoy, NP-C @  02/26/2020, 9:04 AM  GI ATTENDING  History, laboratories, x-rays, recent endoscopy report reviewed.  Patient seen and examined.  Agree with comprehensive consultation note as outlined above.  The patient presents with clinically and hemodynamically significant upper GI bleeding.  Suspect this is related to the large marginal ulcers noted at the time of his recent endoscopy.  Disconcerting that he has blood despite being compliant with aggressive medical therapy (twice daily PPI and 4 times daily sucralfate).  He will require urgent endoscopy this morning after adequate resuscitation.  We will have anesthesia intubate him to protect his airway for his high risk endoscopy.The nature of the procedure, as well as the risks, benefits, and alternatives were carefully and thoroughly reviewed with the patient. Ample time for discussion and questions allowed. The patient understood, was satisfied, and agreed to proceed.  Docia Chuck. Geri Seminole., M.D. [facility] Division of Gastroenterology

## 2020-02-26 NOTE — ED Notes (Signed)
Confirmed with Dr. Dayna Barker that patient will only receive 3 units of Blood at this time.

## 2020-02-27 DIAGNOSIS — R578 Other shock: Secondary | ICD-10-CM

## 2020-02-27 LAB — BPAM FFP
Blood Product Expiration Date: 202201252359
ISSUE DATE / TIME: 202201240855
Unit Type and Rh: 6200

## 2020-02-27 LAB — CBC
HCT: 26 % — ABNORMAL LOW (ref 39.0–52.0)
Hemoglobin: 8.9 g/dL — ABNORMAL LOW (ref 13.0–17.0)
MCH: 31.9 pg (ref 26.0–34.0)
MCHC: 34.2 g/dL (ref 30.0–36.0)
MCV: 93.2 fL (ref 80.0–100.0)
Platelets: 134 10*3/uL — ABNORMAL LOW (ref 150–400)
RBC: 2.79 MIL/uL — ABNORMAL LOW (ref 4.22–5.81)
RDW: 16.2 % — ABNORMAL HIGH (ref 11.5–15.5)
WBC: 7.5 10*3/uL (ref 4.0–10.5)
nRBC: 0 % (ref 0.0–0.2)

## 2020-02-27 LAB — HEMOGLOBIN AND HEMATOCRIT, BLOOD
HCT: 28.5 % — ABNORMAL LOW (ref 39.0–52.0)
Hemoglobin: 8.9 g/dL — ABNORMAL LOW (ref 13.0–17.0)

## 2020-02-27 LAB — BASIC METABOLIC PANEL
Anion gap: 8 (ref 5–15)
BUN: 14 mg/dL (ref 8–23)
CO2: 22 mmol/L (ref 22–32)
Calcium: 7.8 mg/dL — ABNORMAL LOW (ref 8.9–10.3)
Chloride: 107 mmol/L (ref 98–111)
Creatinine, Ser: 0.9 mg/dL (ref 0.61–1.24)
GFR, Estimated: >60 mL/min (ref >60)
Glucose, Bld: 150 mg/dL — ABNORMAL HIGH (ref 70–99)
Potassium: 3.9 mmol/L (ref 3.5–5.1)
Sodium: 137 mmol/L (ref 135–145)

## 2020-02-27 LAB — PREPARE FRESH FROZEN PLASMA: Unit division: 0

## 2020-02-27 NOTE — Progress Notes (Signed)
HISTORY OF PRESENT ILLNESS:  Maxwell Fox is a 75 y.o. male with remote Roux-en-Y gastric bypass surgery who was hospitalized about 2 weeks ago with GI bleeding secondary to marginal ulceration.  He presented yesterday with significant acute upper GI bleeding and underwent urgent EGD.  The overall appearance of the ulcers had improved, though there was an ulcer with visible vessel felt to be responsible for the acute bleeding episode.  This was treated with a combination of epinephrine injection and gold probe coaptation.  He has been stable since.  He is transferred out of the ICU.  He is sitting comfortably in his room with liquid diet.  His daughter is present.  They have a number of questions.  No bowel movements.  REVIEW OF SYSTEMS:  All non-GI ROS negative except for dizziness  Past Medical History:  Diagnosis Date  . B12 nutritional deficiency    malabsorbtion  . BPH (benign prostatic hypertrophy)   . Diverticulosis   . GI bleeding 02/2020  . Hypertension   . Iron deficiency anemia    malabsorption related (s/p gastric bypass)  . Melanoma ([place])   . Obesity    s/p gastric bypass 2000, start 355#  . Pancreatic cyst   . Pulmonary nodules     Past Surgical History:  Procedure Laterality Date  . CRANIOTOMY FOR TUMOR  12/11/2015   metastatic melanoma  . ESOPHAGOGASTRODUODENOSCOPY (EGD) WITH PROPOFOL N/A 02/14/2020   Procedure: ESOPHAGOGASTRODUODENOSCOPY (EGD) WITH PROPOFOL;  Surgeon: Lavena Bullion, DO;  Location: [facility];  Service: Endoscopy;  Laterality: N/A;  . MOHS SURGERY  2012   melanoma, mid back, [place]  . ROUX-EN-Y PROCEDURE  2000  . TOTAL SHOULDER REPLACEMENT Right 2011   [place]    Social History Maxwell Fox  reports that he quit smoking about 57 years ago. He has never used smokeless tobacco. He reports previous alcohol use. He reports that he does not use drugs.  family history includes Breast cancer in his sister; Hypertension in his father;  Macular degeneration in his mother; Melanoma in his brother, mother, and sister; Stomach cancer in his sister; Uterine cancer in his sister.  No Known Allergies  LABORATORIES: Hemoglobin 8.9     PHYSICAL EXAMINATION: Vital signs: BP 118/77   Pulse 66   Temp (!) 97.4 F (36.3 C) (Oral)   Resp 18   Ht 5\' 10"  (1.778 m)   Wt 92.1 kg   SpO2 100%   BMI 29.13 kg/m   Constitutional: Pale but otherwise generally well-appearing, no acute distress Psychiatric: alert and oriented x3, cooperative Eyes: Anicteric Abdomen: Not reexamined Extremities: Bilateral lower extremity edema bilaterally Skin: Pale Neuro: Alert and oriented  ASSESSMENT:  1.  Recurrent acute GI bleeding secondary to marginal ulceration from prior Roux-en-Y gastric bypass surgery.  Status post endoscopic hemostatic therapy.  Stable 2.  Acute blood loss anemia.  Hemoglobin stable at 8.9 3.  General medical problems under the care of the hospitalist service   PLAN:  1.  Continue pantoprazole 40 mg twice daily after completing 48 hours of IV PPI therapy 2.  Continue Carafate slurry 4 times daily 3.  Continue to monitor hemoglobin and stools for evidence of recurrent bleeding 4.  Advance diet to full liquids 5.  GI will continue to follow  Docia Chuck. Geri Seminole., M.D. [facility] Division of Gastroenterology

## 2020-02-27 NOTE — Anesthesia Postprocedure Evaluation (Signed)
Anesthesia Post Note  Patient: Maxwell Fox  Procedure(s) Performed: ESOPHAGOGASTRODUODENOSCOPY (EGD) WITH PROPOFOL (N/A ) HOT HEMOSTASIS (ARGON PLASMA COAGULATION/BICAP) (N/A ) HEMOSTASIS CONTROL     Patient location during evaluation: PACU Anesthesia Type: General Level of consciousness: awake and alert Pain management: pain level controlled Vital Signs Assessment: post-procedure vital signs reviewed and stable Respiratory status: spontaneous breathing, nonlabored ventilation and respiratory function stable Cardiovascular status: blood pressure returned to baseline and stable Postop Assessment: no apparent nausea or vomiting Anesthetic complications: no   No complications documented.  Last Vitals:  Vitals:   02/27/20 0600 02/27/20 0700  BP: (!) 120/59   Pulse: (!) 59   Resp: 13   Temp:  36.5 C  SpO2: 100%     Last Pain:  Vitals:   02/27/20 0700  TempSrc: Oral  PainSc:                  Audry Pili
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
## 2020-02-28 DIAGNOSIS — Z9884 Bariatric surgery status: Secondary | ICD-10-CM

## 2020-02-28 DIAGNOSIS — D62 Acute posthemorrhagic anemia: Secondary | ICD-10-CM | POA: Diagnosis not present

## 2020-02-28 DIAGNOSIS — K250 Acute gastric ulcer with hemorrhage: Secondary | ICD-10-CM | POA: Diagnosis not present

## 2020-02-28 DIAGNOSIS — K920 Hematemesis: Secondary | ICD-10-CM

## 2020-02-28 DIAGNOSIS — R578 Other shock: Secondary | ICD-10-CM | POA: Diagnosis not present

## 2020-02-28 LAB — HEMOGLOBIN AND HEMATOCRIT, BLOOD
HCT: 25.6 % — ABNORMAL LOW (ref 39.0–52.0)
HCT: 28.9 % — ABNORMAL LOW (ref 39.0–52.0)
Hemoglobin: 8.1 g/dL — ABNORMAL LOW (ref 13.0–17.0)
Hemoglobin: 9.2 g/dL — ABNORMAL LOW (ref 13.0–17.0)

## 2020-02-28 NOTE — TOC Initial Note (Addendum)
Transition of Care [facility]) - Initial/Assessment Note    Patient Details  Name: Maxwell Fox MRN: [id number] Date of Birth: 08/13/1945  [facility]) CM/SW Contact:    Joanne Chars, LCSW Phone Number: 02/28/2020, 1:50 PM  Clinical Narrative:  CSW met with pt and daughter Maxwell Fox to discuss discharge plan.  Permission given to speak with daughter and with wife.  Daughter has been helping out and is now moving in to offer more assistance.  Discussed HH recommendation: pt reports [facility] already in place, states they were just getting started prior to this admission and he wants to continue with them.  PCP in place.  Pt is vaccinated for covid and boosted.  Current equipment in home: walker, shower chair, wheelchair.  Pt asking about a life alert device and if medicare will pay for that?  Discussed readmission risk items.   1400: CSW googled life alert info and provided to pt              Expected Discharge Plan: [place] Barriers to Discharge: No Barriers Identified   Patient Goals and CMS Choice Patient states their goals for this hospitalization and ongoing recovery are:: walk without walker CMS Medicare.gov Compare Post Acute Care list provided to::  (pt wants to continue with [facility])    Expected Discharge Plan and Services Expected Discharge Plan: [place]   Discharge Planning Services: CM Consult Post Acute Care Choice: Resumption of Svcs/PTA Provider,Home Health Living arrangements for the past 2 months: Single Family Home                                      Prior Living Arrangements/Services Living arrangements for the past 2 months: Single Family Home Lives with:: Spouse Patient language and need for interpreter reviewed:: Yes Do you feel safe going back to the place where you live?: Yes      Need for Family Participation in Patient Care: Yes (Comment) Care giver support system in place?: Yes  (comment) Current home services: Home PT,Home RN,Home OT Criminal Activity/Legal Involvement Pertinent to Current Situation/Hospitalization: No - Comment as needed  Activities of Daily Living      Permission Sought/Granted Permission sought to share information with : Family Supports Permission granted to share information with : Yes, Verbal Permission Granted  Share Information with NAME: daughter Maxwell Fox, wife Maxwell Fox  Permission granted to share info w AGENCY: Nurse, learning disability        Emotional Assessment Appearance:: Appears stated age Attitude/Demeanor/Rapport: Engaged Affect (typically observed): Appropriate,Pleasant Orientation: : Oriented to Self,Oriented to Place,Oriented to  Time,Oriented to Situation Alcohol / Substance Use: Not Applicable Psych Involvement: No (comment)  Admission diagnosis:  Hemorrhagic shock ([place]) [R57.8] GI bleed [K92.2] Gastrointestinal hemorrhage with hematemesis [K92.0] Patient Active Problem List   Diagnosis Date Noted  . History of Roux-en-Y gastric bypass   . Hemorrhagic shock ([place])   . Acute gastric ulcer with hemorrhage   . Acute GI bleeding 02/14/2020  . Acute blood loss anemia 02/14/2020  . GI bleed 02/14/2020  . Melena   . Marginal ulcers   . Gastroesophageal reflux disease with esophagitis without hemorrhage   . Closed fracture of left superior pubic ramus ([place]) 02/09/2020  . Anxiety and depression 01/10/2018  . History of antineoplastic chemotherapy 07/19/2017  . Chronic midline low back pain without sciatica 04/19/2017  . Metastatic melanoma ([place]) 01/07/2017  .  Adrenal insufficiency due to cancer therapy ([place]) 07/27/2016  . Secondary hypothyroidism 07/27/2016  . H/O gastric bypass 01/06/2016  . Pancreatic cyst 01/06/2016  . Metastatic melanoma of brain 12/31/2015  . Iron deficiency anemia   . B12 nutritional deficiency   . Benign prostatic hyperplasia    PCP:  [name], MD Pharmacy:   [facility], [place] [address] AT [place] [address] Phone: [phone] Fax: [facility] [place], [place] AT [place] [address] Phone: [phone] Fax: [phone]     Social Determinants of Health (SDOH) Interventions    Readmission Risk Interventions No flowsheet data found.

## 2020-02-28 NOTE — Progress Notes (Addendum)
Daily Rounding Note  02/28/2020, 10:35 AM  LOS: 2 days   SUBJECTIVE:   Chief complaint: GI bleed from ulcers at gastrojejunal anastomosis site.  Blood loss anemia. Time, feels "lightheaded" sitting in the chair today.   No shortness of breath.  Last bowel movement was prior to his arrival at the hospital. Tolerated full liquid diet this morning, his first non clear liquid meal since admission No abdominal pain. Is worried by the decline in his Hgb. BPs 120s/60s, HR 50s.    OBJECTIVE:         Vital signs in last 24 hours:    Temp:  [97.4 F (36.3 C)-98.8 F (37.1 C)] 98.8 F (37.1 C) (01/26 0807) Pulse Rate:  [62-66] 62 (01/26 0807) Resp:  [13-20] 13 (01/26 0807) BP: (118-126)/(60-77) 126/62 (01/26 0807) SpO2:  [100 %] 100 % (01/26 0807)   Filed Weights   02/26/20 0630 02/26/20 1222 02/27/20 0447  Weight: 91.6 kg 91.6 kg 92.1 kg   General: Looks chronically ill, alert, comfortable. Heart: RRR. Chest: Lear bilaterally without labored breathing. Abdomen: Soft, nontender, nondistended.  No masses, HSM, bruits, hernias Extremities: Slight lower extremity edema. Neuro/Psych: Oriented x3.  Fluid speech.  No tremors or gross weakness.  Intake/Output from previous day: 01/25 0701 - 01/26 0700 In: 359.1 [I.V.:359.1] Out: 2120 [Urine:2120]  Intake/Output this shift: Total I/O In: -  Out: 300 [Urine:300]  Lab Results: Recent Labs    02/26/20 0145 02/26/20 0202 02/26/20 0938 02/26/20 1542 02/27/20 0414 02/27/20 1654 02/28/20 0422  WBC 6.7  --  9.0  --  7.5  --   --   HGB 5.9*   < > 10.2*   < > 8.9* 8.9* 8.1*  HCT 20.4*   < > 30.9*   < > 26.0* 28.5* 25.6*  PLT 199  --  150  --  134*  --   --    < > = values in this interval not displayed.   BMET Recent Labs    02/26/20 0145 02/26/20 0202 02/27/20 0125  NA 140 141 137  K 4.0 3.9 3.9  CL 111 108 107  CO2 20*  --  22  GLUCOSE 182* 175* 150*  BUN 13 13  14   CREATININE 1.15 1.10 0.90  CALCIUM 7.4*  --  7.8*   LFT Recent Labs    02/26/20 0145  PROT 3.3*  ALBUMIN 1.8*  AST 18  ALT 11  ALKPHOS 79  BILITOT 0.5   PT/INR Recent Labs    02/26/20 0145 02/26/20 0938  LABPROT 15.2 14.3  INR 1.3* 1.2   Hepatitis Panel No results for input(s): HEPBSAG, HCVAB, HEPAIGM, HEPBIGM in the last 72 hours.  Studies/Results: No results found.  ASSESMENT:   *   Mid to emesis, melena/hematochezia.  Recent anastomotic ulcers on EGD of 02/14/2020.  Compliant with Protonix 40 bid, 4 times daily Carafate since discharge. 02/26/2020 EGD:   Anastomotic ulcer with visible vessel treated with epinephrine and gold probe.  Overall appearance of anastomotic ulcers improved compared with previous.  *    Bariatric Roux-en-Y gastrojejunostomy remotely.  *     Blood loss anemia.  S/p 2 PRBCs. Hgb 5.9 >> 9.6 >> 8.9  >> 8.1.  *   Thrombocytopenia.     *    Chronic low-dose prednisone.  PLAN   *   Patient's appointment for later this month was rescheduled to February 8 at 3 PM with [facility].  *  Finish out the Protonix drip tomorrow at 01:40 and continue Carafate slurry 1 g 3 times daily, and HS.   Switch to twice daily Protonix either IV or p.o. tomorrow.  *   H&H pending for this afternoon, repeat CBC in the morning.  *    Advance to regular diet.     [name]  02/28/2020, 10:35 AM Phone [phone]  GI ATTENDING  Interval history data reviewed.  Agree with comprehensive consultation note as outlined above.  Drifting hemoglobin without evidence of GI bleeding.  No bowel movement since admission.  He was also complaining of subjective dizziness yesterday.  Vital signs are stable.  Agree with plans for ongoing PPI and sucralfate therapy as outlined.  Advance diet.  Increase activity as tolerated.  Primary service to address complaints of dizziness as they see fit.  Team will follow up tomorrow.  If he is doing well, may be able to go  home.  I would transfuse him for a hemoglobin level of less than 8.  We have also adjusted his outpatient follow-up as outlined.  Docia Chuck. Geri Seminole., M.D. [facility] Division of Gastroenterology

## 2020-02-28 NOTE — Progress Notes (Signed)
PROGRESS NOTE    Maxwell Fox  IOE:[id number] DOB: 04-Mar-1945 DOA: 02/26/2020 PCP: [name], MD   Chief Complain: GI bleed  Brief Narrative: Patient is a 52 male with history of Roux-en-Y in 2000, chronic adrenal insufficiency on prednisone, depression/anxiety/hypothyroidism, metastatic melanoma status post resection of the brain mets/immunotherapy, recent history of left pelvic fracture who was admitted for the evaluation of GI bleed.  He presented with syncope, hematochezia/hematemesis from home.  On presentation, he was severely hypotensive but responded to IV fluids.  Patient was admitted to ICU for hemorrhagic shock, he was transfused with 2 units of PRBC and 1 day of therapy.  GI was consulted and he underwent EGD and found to have anastomotic ulcer.  Currently on Protonix drip.  Assessment & Plan:   Active Problems:   GI bleed   Acute gastric ulcer with hemorrhage   Hemorrhagic shock (HCC)   Hemorrhagic shock: Currently hemodynamically stable.  Continue monitor blood pressure  Acute upper GI bleeding: EGD was done on 1/24 which showed nonbleeding cratered gastric ulcer with a visible vessel .  No active bleeding at the time of endoscopy.  On Protonix drip.  Plan to continue drip until tomorrow after that he will be switched to oral or IV Protonix as per GI recommendation.  Also on Carafate.  He had EGD done on 1/12 on previous admission which showed esophagitis, anastomotic ulcer and was discharged on PPI.  He was also taking some NSAIDs for his recent pelvic fracture.  Currently on full liquid diet.  Acute blood loss normocytic anemia: He was transfused with 3 units of PRBC, 1 unit of FFP during this hospitalization.  Currently hemoglobin in the range of 8.  Continue to monitor.  Chronic adrenal insufficiency: On maintenance prednisone which we will continue.  Recent history of fracture of left superior/inferior pubic rami: Continue supportive care, pain management but  avoid NSAIDs.  PT/OT following at home.  Metastatic melanoma: Status post resection of brain mets and immunotherapy.  Follows at [facility].  Undergoing  surveillance scans.         DVT prophylaxis:SCD Code Status: Full Family Communication: None at bedside Status is: Inpatient  Remains inpatient appropriate because:Unsafe d/c plan   Dispo: The patient is from: Home              Anticipated d/c is to: Home              Anticipated d/c date is: 1 day              Patient currently is not medically stable to d/c.   Difficult to place patient No     Consultants: GI  Procedures:EGD  Antimicrobials:  Anti-infectives (From admission, onward)   None      Subjective: Patient seen and examined the bedside this afternoon.  Hemodynamically stable.  Sitting on the chair.  No bowel movement.  Denies any abdominal pain.  Concerned about drop in hemoglobin.  Complains of some back pain  Objective: Vitals:   02/27/20 2313 02/28/20 0310 02/28/20 0807 02/28/20 1210  BP: 120/60 123/68 126/62 110/70  Pulse:  64 62 65  Resp: 18 20 13 19   Temp: 97.9 F (36.6 C) 97.8 F (36.6 C) 98.8 F (37.1 C) 98.1 F (36.7 C)  TempSrc: Oral Oral Oral Oral  SpO2:   100% 99%  Weight:      Height:        Intake/Output Summary (Last 24 hours) at 02/28/2020 1243 Last data filed  at 02/28/2020 1517 Gross per 24 hour  Intake 359.07 ml  Output 1720 ml  Net -1360.93 ml   Filed Weights   02/26/20 0630 02/26/20 1222 02/27/20 0447  Weight: 91.6 kg 91.6 kg 92.1 kg    Examination:  General exam: Appears calm and comfortable ,Not in distress,average built HEENT:PERRL,Oral mucosa moist, Ear/Nose normal on gross exam Respiratory system: Bilateral equal air entry, normal vesicular breath sounds, no wheezes or crackles  Cardiovascular system: S1 & S2 heard, RRR. No JVD, murmurs, rubs, gallops or clicks. No pedal edema. Gastrointestinal system: Abdomen is nondistended, soft and nontender. No organomegaly or  masses felt. Normal bowel sounds heard. Central nervous system: Alert and oriented. No focal neurological deficits. Extremities: No edema, no clubbing ,no cyanosis Skin: No rashes, lesions or ulcers,no icterus ,no pallor   Data Reviewed: I have personally reviewed following labs and imaging studies  CBC: Recent Labs  Lab 02/26/20 0145 02/26/20 0202 02/26/20 0938 02/26/20 1542 02/27/20 0414 02/27/20 1654 02/28/20 0422  WBC 6.7  --  9.0  --  7.5  --   --   NEUTROABS 3.4  --   --   --   --   --   --   HGB 5.9*   < > 10.2* 9.6* 8.9* 8.9* 8.1*  HCT 20.4*   < > 30.9* 28.0* 26.0* 28.5* 25.6*  MCV 103.0*  --  94.5  --  93.2  --   --   PLT 199  --  150  --  134*  --   --    < > = values in this interval not displayed.   Basic Metabolic Panel: Recent Labs  Lab 02/26/20 0145 02/26/20 0202 02/27/20 0125  NA 140 141 137  K 4.0 3.9 3.9  CL 111 108 107  CO2 20*  --  22  GLUCOSE 182* 175* 150*  BUN 13 13 14   CREATININE 1.15 1.10 0.90  CALCIUM 7.4*  --  7.8*   GFR: Estimated Creatinine Clearance: 82.1 mL/min (by C-G formula based on SCr of 0.9 mg/dL). Liver Function Tests: Recent Labs  Lab 02/26/20 0145  AST 18  ALT 11  ALKPHOS 79  BILITOT 0.5  PROT 3.3*  ALBUMIN 1.8*   No results for input(s): LIPASE, AMYLASE in the last 168 hours. No results for input(s): AMMONIA in the last 168 hours. Coagulation Profile: Recent Labs  Lab 02/26/20 0145 02/26/20 0938  INR 1.3* 1.2   Cardiac Enzymes: No results for input(s): CKTOTAL, CKMB, CKMBINDEX, TROPONINI in the last 168 hours. BNP (last 3 results) No results for input(s): PROBNP in the last 8760 hours. HbA1C: No results for input(s): HGBA1C in the last 72 hours. CBG: No results for input(s): GLUCAP in the last 168 hours. Lipid Profile: No results for input(s): CHOL, HDL, LDLCALC, TRIG, CHOLHDL, LDLDIRECT in the last 72 hours. Thyroid Function Tests: No results for input(s): TSH, T4TOTAL, FREET4, T3FREE, THYROIDAB in the  last 72 hours. Anemia Panel: No results for input(s): VITAMINB12, FOLATE, FERRITIN, TIBC, IRON, RETICCTPCT in the last 72 hours. Sepsis Labs: No results for input(s): PROCALCITON, LATICACIDVEN in the last 168 hours.  Recent Results (from the past 240 hour(s))  SARS Coronavirus 2 by RT PCR (hospital order, performed in [facility] hospital lab) Nasopharyngeal Nasopharyngeal Swab     Status: None   Collection Time: 02/26/20  1:45 AM   Specimen: Nasopharyngeal Swab  Result Value Ref Range Status   SARS Coronavirus 2 NEGATIVE NEGATIVE Final    Comment: (NOTE)  SARS-CoV-2 target nucleic acids are NOT DETECTED.  The SARS-CoV-2 RNA is generally detectable in upper and lower respiratory specimens during the acute phase of infection. The lowest concentration of SARS-CoV-2 viral copies this assay can detect is 250 copies / mL. A negative result does not preclude SARS-CoV-2 infection and should not be used as the sole basis for treatment or other patient management decisions.  A negative result may occur with improper specimen collection / handling, submission of specimen other than nasopharyngeal swab, presence of viral mutation(s) within the areas targeted by this assay, and inadequate number of viral copies (<250 copies / mL). A negative result must be combined with clinical observations, patient history, and epidemiological information.  Fact Sheet for Patients:   StrictlyIdeas.no  Fact Sheet for Healthcare Providers: BankingDealers.co.za  This test is not yet approved or  cleared by the [place] FDA and has been authorized for detection and/or diagnosis of SARS-CoV-2 by FDA under an Emergency Use Authorization (EUA).  This EUA will remain in effect (meaning this test can be used) for the duration of the COVID-19 declaration under Section 564(b)(1) of the Act, 21 U.S.C. section 360bbb-3(b)(1), unless the authorization is terminated  or revoked sooner.  Performed at [facility], [place] [address] [place], [place] [address]   MRSA PCR Screening     Status: None   Collection Time: 02/26/20  7:34 AM   Specimen: Nasopharyngeal  Result Value Ref Range Status   MRSA by PCR NEGATIVE NEGATIVE Final    Comment:        The GeneXpert MRSA Assay (FDA approved for NASAL specimens only), is one component of a comprehensive MRSA colonization surveillance program. It is not intended to diagnose MRSA infection nor to guide or monitor treatment for MRSA infections. Performed at [facility], [place] [address], [place], [place] [address]          Radiology Studies: No results found.      Scheduled Meds: . sodium chloride   Intravenous Once  . Chlorhexidine Gluconate Cloth  6 each Topical Daily  . citalopram  10 mg Oral Daily  . levothyroxine  88 mcg Oral Q0600  . misoprostol  100 mcg Oral Q6H  . predniSONE  5 mg Oral Q breakfast  . sucralfate  1 g Oral Q6H   Continuous Infusions: . sodium chloride    . lactated ringers 10 mL/hr at 02/26/20 1240  . pantoprozole (PROTONIX) infusion 8 mg/hr (02/27/20 2341)     LOS: 2 days    Time spent: 25 mins.More than 50% of that time was spent in counseling and/or coordination of care.      Shelly Coss, MD [facility] P1/26/2022, 12:43 PM

## 2020-02-29 ENCOUNTER — Other Ambulatory Visit: Payer: Self-pay | Admitting: *Deleted

## 2020-02-29 ENCOUNTER — Other Ambulatory Visit: Payer: Self-pay | Admitting: Physician Assistant

## 2020-02-29 ENCOUNTER — Encounter (HOSPITAL_COMMUNITY): Payer: Self-pay | Admitting: Internal Medicine

## 2020-02-29 DIAGNOSIS — K920 Hematemesis: Secondary | ICD-10-CM | POA: Diagnosis not present

## 2020-02-29 DIAGNOSIS — R578 Other shock: Secondary | ICD-10-CM | POA: Diagnosis not present

## 2020-02-29 DIAGNOSIS — D62 Acute posthemorrhagic anemia: Secondary | ICD-10-CM

## 2020-02-29 DIAGNOSIS — Z9884 Bariatric surgery status: Secondary | ICD-10-CM | POA: Diagnosis not present

## 2020-02-29 DIAGNOSIS — K250 Acute gastric ulcer with hemorrhage: Secondary | ICD-10-CM | POA: Diagnosis not present

## 2020-02-29 LAB — HEMOGLOBIN AND HEMATOCRIT, BLOOD
HCT: 25 % — ABNORMAL LOW (ref 39.0–52.0)
Hemoglobin: 8.3 g/dL — ABNORMAL LOW (ref 13.0–17.0)

## 2020-02-29 MED ORDER — SUCRALFATE 1 GM/10ML PO SUSP: 1.0000 g | Freq: Three times a day (TID) | ORAL | 0 refills | Status: DC

## 2020-02-29 MED ORDER — OXYCODONE HCL 10 MG PO TABS: 10.0000 mg | ORAL_TABLET | Freq: Four times a day (QID) | ORAL | 0 refills | Status: DC | PRN

## 2020-02-29 MED ORDER — PANTOPRAZOLE SODIUM 40 MG PO TBEC
40.0000 mg | DELAYED_RELEASE_TABLET | Freq: Two times a day (BID) | ORAL | Status: DC
  Administered 2020-02-29: 40 mg via ORAL
  Filled 2020-02-29: qty 1

## 2020-02-29 NOTE — Consult Note (Signed)
   [facility] Inpatient Consult   02/29/2020  Zaccary Creech March 25, 1945 [id number]   [facility] [ACO] Patient:   Medicare   *Alerted by the [facility] Multidisciplinary Case Discussion team of patient's hospitalization and active with [facility] G-NP today.  Patient is currently active with [facility] for chronic disease management services.  Patient has been engaged by a [place]- NP. Our community based plan of care has focused on disease management and community resource support.    Patient will receive a post hospital call and will be evaluated for assessments and disease process education.    Plan: Discussion in MCD meeting for post hospital follow up needs.  Will follow for transition of care and progress with Inpatient Transition Of Care [TOC] team member to make aware that [facility] following. Inpatient TOC states patient to have [place] with [facility].   Of note, [facility] Care Management services does not replace or interfere with any services that are needed or arranged by inpatient [facility] care management team.  For additional questions or referrals please contact:  Natividad Brood, RN BSN [facility] Liaison  [phone] business mobile phone Toll free office [phone]  Fax number: [phone] [email or web address]

## 2020-02-29 NOTE — Discharge Summary (Signed)
Physician Discharge Summary  Maxwell Fox [id number] DOB: 12/08/1945 DOA: 02/26/2020  PCP: Maxwell Rail, MD  Admit date: 02/26/2020 Discharge date: 02/29/2020  Admitted From: Home Disposition:  Home  Discharge Condition:Stable CODE STATUS:FUL Diet recommendation:  Regular  Brief/Interim Summary: Patient is a 80 male with history of Roux-en-Y in 2000, chronic adrenal insufficiency on prednisone, depression/anxiety/hypothyroidism, metastatic melanoma status post resection of the brain mets/immunotherapy, recent history of left pelvic fracture who was admitted for the evaluation of GI bleed.  He presented with syncope, hematochezia/hematemesis from home.  On presentation, he was severely hypotensive but responded to IV fluids.  Patient was admitted to ICU for hemorrhagic shock, he was transfused with 2 units of PRBC and 1 day of therapy.  GI was consulted and he underwent EGD and found to have anastomotic ulcer but no active bleeding.  He was continued for Protonix drip for 3 days.  His hemoglobin has remained stable in the range of 8.  He is medically stable for discharge to home with Protonix twice daily.  He will follow-up with gastroenterology as an outpatient.  Following problems were addressed during his hospitalization:  Hemorrhagic shock: Currently hemodynamically stable.    Acute upper GI bleeding: EGD was done on 1/24 which showed nonbleeding cratered gastric ulcer with a visible vessel .  No active bleeding at the time of endoscopy.  He was continued on Protonix drip for 3 days.He will be switched to oral .  Also on Carafate.  He had EGD done on 1/12 on previous admission which showed esophagitis, anastomotic ulcer and was discharged on PPI.  He was also taking some NSAIDs for his recent pelvic fracture.  Currently on regular diet  Acute blood loss normocytic anemia: He was transfused with 3 units of PRBC, 1 unit of FFP during this hospitalization.  Currently hemoglobin in the  range of 8.  Check hemoglobin in a week.  Chronic adrenal insufficiency: On maintenance prednisone which we will continue.  Recent history of fracture of left superior/inferior pubic rami: Continue supportive care, pain management but avoid NSAIDs.  PT/OT following at home.  Metastatic melanoma: Status post resection of brain mets and immunotherapy.  Follows at [facility].  Undergoing  surveillance scans.   Discharge Diagnoses:  Active Problems:   GI bleed   Acute gastric ulcer with hemorrhage   Hemorrhagic shock (HCC)   History of Roux-en-Y gastric bypass    Discharge Instructions  Discharge Instructions    Diet - low sodium heart healthy   Complete by: As directed    Discharge instructions   Complete by: As directed    1)Please follow-up with your PCP in a week.  Do a CBC test to check your hemoglobin during the follow-up. 2)Take pressure medications as instructed.  Avoid NSAIDs, aspirin 3)You will be called by gastroenterology for the follow-up appointment.   Increase activity slowly   Complete by: As directed      Allergies as of 02/29/2020   No Known Allergies     Medication List    TAKE these medications   calcium carbonate 1250 (500 Ca) MG tablet Commonly known as: OS-CAL - dosed in mg of elemental calcium Take 1 tablet by mouth every evening.   clonazePAM 1 MG tablet Commonly known as: KLONOPIN Take 1 tablet (1 mg total) by mouth daily as needed. What changed: reasons to take this   escitalopram 10 MG tablet Commonly known as: LEXAPRO TAKE 1 TABLET(10 MG) BY MOUTH DAILY What changed:   how much  to take  how to take this  when to take this  additional instructions   levothyroxine 88 MCG tablet Commonly known as: SYNTHROID Take 1 tablet (88 mcg total) by mouth daily before breakfast.   Multivitamin Adults Tabs Take 1 tablet by mouth every evening.   Oxycodone HCl 10 MG Tabs Take 1 tablet (10 mg total) by mouth every 6 (six) hours as needed. What  changed: reasons to take this   pantoprazole 40 MG tablet Commonly known as: PROTONIX Take 1 tablet (40 mg total) by mouth 2 (two) times daily for 60 days, THEN 1 tablet (40 mg total) daily. Start taking on: February 20, 2020   polyethylene glycol 17 g packet Commonly known as: MIRALAX / GLYCOLAX Take 17 g by mouth daily as needed for mild constipation or moderate constipation.   predniSONE 5 MG tablet Commonly known as: DELTASONE Take 1 tablet (5 mg total) by mouth daily with breakfast.   sucralfate 1 GM/10ML suspension Commonly known as: CARAFATE Take 10 mLs (1 g total) by mouth 4 (four) times daily -  with meals and at bedtime for 14 days.   tamsulosin 0.4 MG Caps capsule Commonly known as: FLOMAX TAKE 1 CAPSULE(0.4 MG) BY MOUTH DAILY AFTER SUPPER What changed: See the new instructions.   Vitamin D 50 MCG (2000 UT) tablet Take 1 tablet (2,000 Units total) by mouth daily.       Follow-up Information    Maxwell Rail, MD. Go on 03/08/2020.   Specialty: Internal Medicine Why: Please attend your hospital discharge appointment with Dr Quay Burow on Friday, 03/08/20, at 1:00pm. Contact information: [place] [address] [phone]        [facility] Follow up.   Specialty: Home Health Services Why: Alvis Lemmings will call you withing 24-48 hours to schedule your home visit. Contact information: [place] [address] [phone]        [facility] Follow up.   Why: Go to lab at the end of next week for blood draw. Contact information: [place] [phone]       Anon Raices, Glade Nurse, PA-C Follow up on 03/12/2020.   Specialty: Gastroenterology Why: 3 PM.  This is a follow-up office visit with GI. Contact information: [address] [place] [address] [phone]              No Known Allergies  Consultations:  GI   Procedures/Studies: DG Pelvis  Portable  Result Date: 02/14/2020 CLINICAL DATA:  Left hip/pelvic pain.  History of fall. EXAM: PORTABLE PELVIS 1-2 VIEWS COMPARISON:  01/17/2020. FINDINGS: Left superior pubic ramus fracture again noted. Fracture of the left inferior pubic ramus is also most likely present. Hips are intact. Diffuse osteopenia. Degenerative change lumbar spine and both hips. IMPRESSION: Left superior pubic ramus fracture again noted. Fracture of the left inferior pubic ramus is also most likely present. No evidence of hip fracture. Electronically Signed   By: Marcello Moores  Register   On: 02/14/2020 06:08   DG Chest Port 1 View  Result Date: 02/14/2020 CLINICAL DATA:  Rectal bleeding, fall 3 weeks prior EXAM: PORTABLE CHEST 1 VIEW COMPARISON:  01/17/2020 FINDINGS: No consolidation, features of edema, pneumothorax, or effusion. Pulmonary vascularity is normally distributed. The aorta is calcified. The remaining cardiomediastinal contours are unremarkable. No acute osseous or soft tissue abnormality. Degenerative changes are present in the imaged spine and left shoulder. Prior right shoulder arthroplasty. Surgical  clips project over the bilateral axilla and in the upper abdomen. Telemetry leads overlie the chest. IMPRESSION: No acute cardiopulmonary abnormality. Aortic Atherosclerosis (ICD10-I70.0). Electronically Signed   By: Lovena Le M.D.   On: 02/14/2020 03:21      Subjective: Patient seen and examined the bedside this morning.  Hemodynamically stable for discharge.  Discharge Exam: Vitals:   02/29/20 0604 02/29/20 1402  BP: 121/65 117/70  Pulse: 67 72  Resp: 13 17  Temp: 98.8 F (37.1 C) 98.8 F (37.1 C)  SpO2: 100% 100%   Vitals:   02/28/20 1210 02/28/20 2035 02/29/20 0604 02/29/20 1402  BP: 110/70 (!) 99/55 121/65 117/70  Pulse: 65 64 67 72  Resp: 19 18 13 17   Temp: 98.1 F (36.7 C) 98.3 F (36.8 C) 98.8 F (37.1 C) 98.8 F (37.1 C)  TempSrc: Oral Oral Oral Oral  SpO2: 99% 100% 100% 100%   Weight:      Height:        General: Pt is alert, awake, not in acute distress Cardiovascular: RRR, S1/S2 +, no rubs, no gallops Respiratory: CTA bilaterally, no wheezing, no rhonchi Abdominal: Soft, NT, ND, bowel sounds + Extremities: no edema, no cyanosis    The results of significant diagnostics from this hospitalization (including imaging, microbiology, ancillary and laboratory) are listed below for reference.     Microbiology: Recent Results (from the past 240 hour(s))  SARS Coronavirus 2 by RT PCR (hospital order, performed in [facility] hospital lab) Nasopharyngeal Nasopharyngeal Swab     Status: None   Collection Time: 02/26/20  1:45 AM   Specimen: Nasopharyngeal Swab  Result Value Ref Range Status   SARS Coronavirus 2 NEGATIVE NEGATIVE Final    Comment: (NOTE) SARS-CoV-2 target nucleic acids are NOT DETECTED.  The SARS-CoV-2 RNA is generally detectable in upper and lower respiratory specimens during the acute phase of infection. The lowest concentration of SARS-CoV-2 viral copies this assay can detect is 250 copies / mL. A negative result does not preclude SARS-CoV-2 infection and should not be used as the sole basis for treatment or other patient management decisions.  A negative result may occur with improper specimen collection / handling, submission of specimen other than nasopharyngeal swab, presence of viral mutation(s) within the areas targeted by this assay, and inadequate number of viral copies (<250 copies / mL). A negative result must be combined with clinical observations, patient history, and epidemiological information.  Fact Sheet for Patients:   StrictlyIdeas.no  Fact Sheet for Healthcare Providers: BankingDealers.co.za  This test is not yet approved or  cleared by the [place] FDA and has been authorized for detection and/or diagnosis of SARS-CoV-2 by FDA under an Emergency Use Authorization  (EUA).  This EUA will remain in effect (meaning this test can be used) for the duration of the COVID-19 declaration under Section 564(b)(1) of the Act, 21 U.S.C. section 360bbb-3(b)(1), unless the authorization is terminated or revoked sooner.  Performed at [facility], [place] [address], [place], [place] [address]   MRSA PCR Screening     Status: None   Collection Time: 02/26/20  7:34 AM   Specimen: Nasopharyngeal  Result Value Ref Range Status   MRSA by PCR NEGATIVE NEGATIVE Final    Comment:        The GeneXpert MRSA Assay (FDA approved for NASAL specimens only), is one component of a comprehensive MRSA colonization surveillance program. It is not intended to diagnose MRSA infection nor to guide or monitor treatment  for MRSA infections. Performed at [facility], [place] [address] [place], [place] [address]      Labs: BNP (last 3 results) No results for input(s): BNP in the last 8760 hours. Basic Metabolic Panel: Recent Labs  Lab 02/26/20 0145 02/26/20 0202 02/27/20 0125  NA 140 141 137  K 4.0 3.9 3.9  CL 111 108 107  CO2 20*  --  22  GLUCOSE 182* 175* 150*  BUN 13 13 14   CREATININE 1.15 1.10 0.90  CALCIUM 7.4*  --  7.8*   Liver Function Tests: Recent Labs  Lab 02/26/20 0145  AST 18  ALT 11  ALKPHOS 79  BILITOT 0.5  PROT 3.3*  ALBUMIN 1.8*   No results for input(s): LIPASE, AMYLASE in the last 168 hours. No results for input(s): AMMONIA in the last 168 hours. CBC: Recent Labs  Lab 02/26/20 0145 02/26/20 0202 02/26/20 0938 02/26/20 1542 02/27/20 0414 02/27/20 1654 02/28/20 0422 02/28/20 1642 02/29/20 0427  WBC 6.7  --  9.0  --  7.5  --   --   --   --   NEUTROABS 3.4  --   --   --   --   --   --   --   --   HGB 5.9*   < > 10.2*   < > 8.9* 8.9* 8.1* 9.2* 8.3*  HCT 20.4*   < > 30.9*   < > 26.0* 28.5* 25.6* 28.9* 25.0*  MCV 103.0*  --  94.5  --  93.2  --   --   --   --   PLT 199  --  150  --  134*  --   --   --   --    < > = values in  this interval not displayed.   Cardiac Enzymes: No results for input(s): CKTOTAL, CKMB, CKMBINDEX, TROPONINI in the last 168 hours. BNP: Invalid input(s): POCBNP CBG: No results for input(s): GLUCAP in the last 168 hours. D-Dimer No results for input(s): DDIMER in the last 72 hours. Hgb A1c No results for input(s): HGBA1C in the last 72 hours. Lipid Profile No results for input(s): CHOL, HDL, LDLCALC, TRIG, CHOLHDL, LDLDIRECT in the last 72 hours. Thyroid function studies No results for input(s): TSH, T4TOTAL, T3FREE, THYROIDAB in the last 72 hours.  Invalid input(s): FREET3 Anemia work up No results for input(s): VITAMINB12, FOLATE, FERRITIN, TIBC, IRON, RETICCTPCT in the last 72 hours. Urinalysis    Component Value Date/Time   COLORURINE YELLOW 01/17/2020 1005   APPEARANCEUR CLEAR 01/17/2020 1005   LABSPEC 1.012 01/17/2020 1005   PHURINE 5.0 01/17/2020 1005   GLUCOSEU NEGATIVE 01/17/2020 1005   HGBUR MODERATE (A) 01/17/2020 1005   BILIRUBINUR NEGATIVE 01/17/2020 1005   KETONESUR NEGATIVE 01/17/2020 1005   PROTEINUR NEGATIVE 01/17/2020 1005   NITRITE NEGATIVE 01/17/2020 1005   LEUKOCYTESUR NEGATIVE 01/17/2020 1005   Sepsis Labs Invalid input(s): PROCALCITONIN,  WBC,  LACTICIDVEN Microbiology Recent Results (from the past 240 hour(s))  SARS Coronavirus 2 by RT PCR (hospital order, performed in [facility] hospital lab) Nasopharyngeal Nasopharyngeal Swab     Status: None   Collection Time: 02/26/20  1:45 AM   Specimen: Nasopharyngeal Swab  Result Value Ref Range Status   SARS Coronavirus 2 NEGATIVE NEGATIVE Final    Comment: (NOTE) SARS-CoV-2 target nucleic acids are NOT DETECTED.  The SARS-CoV-2 RNA is generally detectable in upper and lower respiratory specimens during the acute phase of infection. The lowest concentration of SARS-CoV-2  viral copies this assay can detect is 250 copies / mL. A negative result does not preclude SARS-CoV-2 infection and should not be  used as the sole basis for treatment or other patient management decisions.  A negative result may occur with improper specimen collection / handling, submission of specimen other than nasopharyngeal swab, presence of viral mutation(s) within the areas targeted by this assay, and inadequate number of viral copies (<250 copies / mL). A negative result must be combined with clinical observations, patient history, and epidemiological information.  Fact Sheet for Patients:   StrictlyIdeas.no  Fact Sheet for Healthcare Providers: BankingDealers.co.za  This test is not yet approved or  cleared by the [place] FDA and has been authorized for detection and/or diagnosis of SARS-CoV-2 by FDA under an Emergency Use Authorization (EUA).  This EUA will remain in effect (meaning this test can be used) for the duration of the COVID-19 declaration under Section 564(b)(1) of the Act, 21 U.S.C. section 360bbb-3(b)(1), unless the authorization is terminated or revoked sooner.  Performed at [facility], [place] [address] [place], [place] [address]   MRSA PCR Screening     Status: None   Collection Time: 02/26/20  7:34 AM   Specimen: Nasopharyngeal  Result Value Ref Range Status   MRSA by PCR NEGATIVE NEGATIVE Final    Comment:        The GeneXpert MRSA Assay (FDA approved for NASAL specimens only), is one component of a comprehensive MRSA colonization surveillance program. It is not intended to diagnose MRSA infection nor to guide or monitor treatment for MRSA infections. Performed at [facility], [place] [address] [place], [place] [address]     Please note: You were cared for by a hospitalist during your hospital stay. Once you are discharged, your primary care physician will handle any further medical issues. Please note that NO REFILLS for any discharge medications will be authorized once you are discharged, as it is imperative  that you return to your primary care physician (or establish a relationship with a primary care physician if you do not have one) for your post hospital discharge needs so that they can reassess your need for medications and monitor your lab values.    Time coordinating discharge: 40 minutes  SIGNED:   Shelly Coss, MD  [facility] 02/29/2020, 2:33 PM Pager [phone]  If 7PM-7AM, please contact night-coverage [email or web address] Password [email or web address]
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
## 2020-03-01 ENCOUNTER — Other Ambulatory Visit: Payer: Self-pay | Admitting: *Deleted

## 2020-03-01 LAB — TYPE AND SCREEN
ABO/RH(D): A POS
Antibody Screen: NEGATIVE
Unit division: 0
Unit division: 0
Unit division: 0
Unit division: 0
Unit division: 0

## 2020-03-01 LAB — BPAM RBC
Blood Product Expiration Date: 202202072359
Blood Product Expiration Date: 202202122359
Blood Product Expiration Date: 202202142359
Blood Product Expiration Date: 202202222359
Blood Product Expiration Date: 202202242359
ISSUE DATE / TIME: 202201240143
ISSUE DATE / TIME: 202201240232
ISSUE DATE / TIME: 202201240232
ISSUE DATE / TIME: 202201240301
ISSUE DATE / TIME: 202201240301
Unit Type and Rh: 5100
Unit Type and Rh: 5100
Unit Type and Rh: 5100
Unit Type and Rh: 6200
Unit Type and Rh: 6200

## 2020-03-01 NOTE — Patient Outreach (Signed)
[facility]) Care Management  03/01/2020  Maxwell Fox 1945/03/07 [id number]  Telephone outreach to follow up on [facility] discharge for hemorrhagic shock, Unsuccessful, left message and requested a return call.  [name] Myrtie Neither, MSN, [facility] Gerontological Nurse Practitioner [facility] Care Management [phone]

## 2020-03-02 DIAGNOSIS — E230 Hypopituitarism: Secondary | ICD-10-CM | POA: Diagnosis not present

## 2020-03-02 DIAGNOSIS — S32810D Multiple fractures of pelvis with stable disruption of pelvic ring, subsequent encounter for fracture with routine healing: Secondary | ICD-10-CM | POA: Diagnosis not present

## 2020-03-02 DIAGNOSIS — M47816 Spondylosis without myelopathy or radiculopathy, lumbar region: Secondary | ICD-10-CM | POA: Diagnosis not present

## 2020-03-02 DIAGNOSIS — E039 Hypothyroidism, unspecified: Secondary | ICD-10-CM | POA: Diagnosis not present

## 2020-03-02 DIAGNOSIS — E2740 Unspecified adrenocortical insufficiency: Secondary | ICD-10-CM | POA: Diagnosis not present

## 2020-03-02 DIAGNOSIS — S72002D Fracture of unspecified part of neck of left femur, subsequent encounter for closed fracture with routine healing: Secondary | ICD-10-CM | POA: Diagnosis not present

## 2020-03-03 ENCOUNTER — Inpatient Hospital Stay (HOSPITAL_COMMUNITY)
Admission: EM | Admit: 2020-03-03 | Discharge: 2020-03-06 | DRG: 378 | Disposition: A | Discharge status: Home Health | Payer: Medicare Other | Attending: Internal Medicine | Admitting: Internal Medicine

## 2020-03-03 ENCOUNTER — Other Ambulatory Visit: Payer: Self-pay

## 2020-03-03 ENCOUNTER — Encounter (HOSPITAL_COMMUNITY)
Admission: EM | Disposition: A | Discharge status: Home Health | Payer: Self-pay | Source: Home / Self Care | Attending: Internal Medicine

## 2020-03-03 ENCOUNTER — Inpatient Hospital Stay (HOSPITAL_COMMUNITY): Payer: Medicare Other | Admitting: Anesthesiology

## 2020-03-03 ENCOUNTER — Encounter (HOSPITAL_COMMUNITY): Payer: Self-pay | Admitting: Emergency Medicine

## 2020-03-03 DIAGNOSIS — Z9884 Bariatric surgery status: Secondary | ICD-10-CM

## 2020-03-03 DIAGNOSIS — E669 Obesity, unspecified: Secondary | ICD-10-CM | POA: Diagnosis not present

## 2020-03-03 DIAGNOSIS — D62 Acute posthemorrhagic anemia: Secondary | ICD-10-CM | POA: Diagnosis not present

## 2020-03-03 DIAGNOSIS — E273 Drug-induced adrenocortical insufficiency: Secondary | ICD-10-CM | POA: Diagnosis present

## 2020-03-03 DIAGNOSIS — K5791 Diverticulosis of intestine, part unspecified, without perforation or abscess with bleeding: Secondary | ICD-10-CM | POA: Diagnosis not present

## 2020-03-03 DIAGNOSIS — E538 Deficiency of other specified B group vitamins: Secondary | ICD-10-CM | POA: Diagnosis not present

## 2020-03-03 DIAGNOSIS — D508 Other iron deficiency anemias: Secondary | ICD-10-CM | POA: Diagnosis not present

## 2020-03-03 DIAGNOSIS — K5731 Diverticulosis of large intestine without perforation or abscess with bleeding: Principal | ICD-10-CM | POA: Diagnosis present

## 2020-03-03 DIAGNOSIS — Z79899 Other long term (current) drug therapy: Secondary | ICD-10-CM | POA: Diagnosis not present

## 2020-03-03 DIAGNOSIS — I959 Hypotension, unspecified: Secondary | ICD-10-CM | POA: Diagnosis not present

## 2020-03-03 DIAGNOSIS — F32A Depression, unspecified: Secondary | ICD-10-CM | POA: Diagnosis present

## 2020-03-03 DIAGNOSIS — F419 Anxiety disorder, unspecified: Secondary | ICD-10-CM | POA: Diagnosis present

## 2020-03-03 DIAGNOSIS — Z79890 Hormone replacement therapy (postmenopausal): Secondary | ICD-10-CM | POA: Diagnosis not present

## 2020-03-03 DIAGNOSIS — D649 Anemia, unspecified: Secondary | ICD-10-CM | POA: Diagnosis present

## 2020-03-03 DIAGNOSIS — I10 Essential (primary) hypertension: Secondary | ICD-10-CM | POA: Diagnosis present

## 2020-03-03 DIAGNOSIS — E039 Hypothyroidism, unspecified: Secondary | ICD-10-CM | POA: Diagnosis present

## 2020-03-03 DIAGNOSIS — K5790 Diverticulosis of intestine, part unspecified, without perforation or abscess without bleeding: Secondary | ICD-10-CM | POA: Diagnosis not present

## 2020-03-03 DIAGNOSIS — M47816 Spondylosis without myelopathy or radiculopathy, lumbar region: Secondary | ICD-10-CM | POA: Diagnosis not present

## 2020-03-03 DIAGNOSIS — C779 Secondary and unspecified malignant neoplasm of lymph node, unspecified: Secondary | ICD-10-CM | POA: Diagnosis present

## 2020-03-03 DIAGNOSIS — K641 Second degree hemorrhoids: Secondary | ICD-10-CM | POA: Diagnosis present

## 2020-03-03 DIAGNOSIS — Z85820 Personal history of malignant melanoma of skin: Secondary | ICD-10-CM

## 2020-03-03 DIAGNOSIS — Z96611 Presence of right artificial shoulder joint: Secondary | ICD-10-CM | POA: Diagnosis present

## 2020-03-03 DIAGNOSIS — Z7952 Long term (current) use of systemic steroids: Secondary | ICD-10-CM | POA: Diagnosis not present

## 2020-03-03 DIAGNOSIS — K250 Acute gastric ulcer with hemorrhage: Secondary | ICD-10-CM | POA: Diagnosis present

## 2020-03-03 DIAGNOSIS — Z20822 Contact with and (suspected) exposure to covid-19: Secondary | ICD-10-CM | POA: Diagnosis present

## 2020-03-03 DIAGNOSIS — S32810D Multiple fractures of pelvis with stable disruption of pelvic ring, subsequent encounter for fracture with routine healing: Secondary | ICD-10-CM | POA: Diagnosis not present

## 2020-03-03 DIAGNOSIS — K921 Melena: Secondary | ICD-10-CM | POA: Diagnosis not present

## 2020-03-03 DIAGNOSIS — K289 Gastrojejunal ulcer, unspecified as acute or chronic, without hemorrhage or perforation: Secondary | ICD-10-CM | POA: Diagnosis present

## 2020-03-03 DIAGNOSIS — C439 Malignant melanoma of skin, unspecified: Secondary | ICD-10-CM | POA: Diagnosis present

## 2020-03-03 DIAGNOSIS — C7931 Secondary malignant neoplasm of brain: Secondary | ICD-10-CM | POA: Diagnosis present

## 2020-03-03 DIAGNOSIS — E639 Nutritional deficiency, unspecified: Secondary | ICD-10-CM | POA: Diagnosis present

## 2020-03-03 DIAGNOSIS — Z87891 Personal history of nicotine dependence: Secondary | ICD-10-CM | POA: Diagnosis not present

## 2020-03-03 DIAGNOSIS — T451X5S Adverse effect of antineoplastic and immunosuppressive drugs, sequela: Secondary | ICD-10-CM | POA: Diagnosis not present

## 2020-03-03 DIAGNOSIS — Z8249 Family history of ischemic heart disease and other diseases of the circulatory system: Secondary | ICD-10-CM | POA: Diagnosis not present

## 2020-03-03 DIAGNOSIS — Z808 Family history of malignant neoplasm of other organs or systems: Secondary | ICD-10-CM | POA: Diagnosis not present

## 2020-03-03 DIAGNOSIS — S72002D Fracture of unspecified part of neck of left femur, subsequent encounter for closed fracture with routine healing: Secondary | ICD-10-CM | POA: Diagnosis not present

## 2020-03-03 DIAGNOSIS — K259 Gastric ulcer, unspecified as acute or chronic, without hemorrhage or perforation: Secondary | ICD-10-CM | POA: Diagnosis not present

## 2020-03-03 DIAGNOSIS — N289 Disorder of kidney and ureter, unspecified: Secondary | ICD-10-CM | POA: Diagnosis not present

## 2020-03-03 DIAGNOSIS — K649 Unspecified hemorrhoids: Secondary | ICD-10-CM | POA: Diagnosis not present

## 2020-03-03 DIAGNOSIS — K254 Chronic or unspecified gastric ulcer with hemorrhage: Secondary | ICD-10-CM | POA: Diagnosis not present

## 2020-03-03 DIAGNOSIS — E2740 Unspecified adrenocortical insufficiency: Secondary | ICD-10-CM | POA: Diagnosis present

## 2020-03-03 DIAGNOSIS — I700 Atherosclerosis of aorta: Secondary | ICD-10-CM | POA: Diagnosis not present

## 2020-03-03 DIAGNOSIS — Z6826 Body mass index (BMI) 26.0-26.9, adult: Secondary | ICD-10-CM | POA: Diagnosis not present

## 2020-03-03 DIAGNOSIS — E230 Hypopituitarism: Secondary | ICD-10-CM | POA: Diagnosis not present

## 2020-03-03 DIAGNOSIS — M8588 Other specified disorders of bone density and structure, other site: Secondary | ICD-10-CM | POA: Diagnosis not present

## 2020-03-03 DIAGNOSIS — K5730 Diverticulosis of large intestine without perforation or abscess without bleeding: Secondary | ICD-10-CM | POA: Diagnosis not present

## 2020-03-03 DIAGNOSIS — K625 Hemorrhage of anus and rectum: Secondary | ICD-10-CM | POA: Diagnosis not present

## 2020-03-03 DIAGNOSIS — D509 Iron deficiency anemia, unspecified: Secondary | ICD-10-CM | POA: Diagnosis not present

## 2020-03-03 DIAGNOSIS — N400 Enlarged prostate without lower urinary tract symptoms: Secondary | ICD-10-CM | POA: Diagnosis present

## 2020-03-03 DIAGNOSIS — R911 Solitary pulmonary nodule: Secondary | ICD-10-CM | POA: Diagnosis not present

## 2020-03-03 DIAGNOSIS — E038 Other specified hypothyroidism: Secondary | ICD-10-CM | POA: Diagnosis present

## 2020-03-03 DIAGNOSIS — K862 Cyst of pancreas: Secondary | ICD-10-CM | POA: Diagnosis not present

## 2020-03-03 DIAGNOSIS — T380X5D Adverse effect of glucocorticoids and synthetic analogues, subsequent encounter: Secondary | ICD-10-CM | POA: Diagnosis not present

## 2020-03-03 HISTORY — PX: ESOPHAGOGASTRODUODENOSCOPY (EGD) WITH PROPOFOL: SHX5813

## 2020-03-03 LAB — SARS CORONAVIRUS 2 BY RT PCR (HOSPITAL ORDER, PERFORMED IN ~~LOC~~ HOSPITAL LAB): SARS Coronavirus 2: NEGATIVE

## 2020-03-03 LAB — CBC
HCT: 32.2 % — ABNORMAL LOW (ref 39.0–52.0)
HCT: 28.5 % — ABNORMAL LOW (ref 39.0–52.0)
Hemoglobin: 9.9 g/dL — ABNORMAL LOW (ref 13.0–17.0)
Hemoglobin: 9.1 g/dL — ABNORMAL LOW (ref 13.0–17.0)
MCH: 31.2 pg (ref 26.0–34.0)
MCH: 31.8 pg (ref 26.0–34.0)
MCHC: 30.7 g/dL (ref 30.0–36.0)
MCHC: 31.9 g/dL (ref 30.0–36.0)
MCV: 101.6 fL — ABNORMAL HIGH (ref 80.0–100.0)
MCV: 99.7 fL (ref 80.0–100.0)
Platelets: 196 10*3/uL (ref 150–400)
Platelets: 184 10*3/uL (ref 150–400)
RBC: 3.17 MIL/uL — ABNORMAL LOW (ref 4.22–5.81)
RBC: 2.86 MIL/uL — ABNORMAL LOW (ref 4.22–5.81)
RDW: 15.4 % (ref 11.5–15.5)
RDW: 15.3 % (ref 11.5–15.5)
WBC: 6.6 10*3/uL (ref 4.0–10.5)
WBC: 6.9 10*3/uL (ref 4.0–10.5)
nRBC: 0 % (ref 0.0–0.2)
nRBC: 0 % (ref 0.0–0.2)

## 2020-03-03 LAB — COMPREHENSIVE METABOLIC PANEL WITH GFR
ALT: 14 U/L (ref 0–44)
AST: 18 U/L (ref 15–41)
Albumin: 2.6 g/dL — ABNORMAL LOW (ref 3.5–5.0)
Alkaline Phosphatase: 88 U/L (ref 38–126)
Anion gap: 6 (ref 5–15)
BUN: 10 mg/dL (ref 8–23)
CO2: 27 mmol/L (ref 22–32)
Calcium: 8.3 mg/dL — ABNORMAL LOW (ref 8.9–10.3)
Chloride: 106 mmol/L (ref 98–111)
Creatinine, Ser: 0.94 mg/dL (ref 0.61–1.24)
GFR, Estimated: >60 mL/min
Glucose, Bld: 97 mg/dL (ref 70–99)
Potassium: 4 mmol/L (ref 3.5–5.1)
Sodium: 139 mmol/L (ref 135–145)
Total Bilirubin: 0.6 mg/dL (ref 0.3–1.2)
Total Protein: 4.9 g/dL — ABNORMAL LOW (ref 6.5–8.1)

## 2020-03-03 LAB — POC OCCULT BLOOD, ED: Fecal Occult Bld: POSITIVE — AB

## 2020-03-03 LAB — TYPE AND SCREEN
ABO/RH(D): A POS
Antibody Screen: NEGATIVE

## 2020-03-03 SURGERY — ESOPHAGOGASTRODUODENOSCOPY (EGD) WITH PROPOFOL
Anesthesia: Monitor Anesthesia Care

## 2020-03-03 MED ORDER — OXYCODONE HCL 5 MG PO TABS
5.0000 mg | ORAL_TABLET | Freq: Once | ORAL | Status: AC | PRN
  Administered 2020-03-03: 5 mg via ORAL

## 2020-03-03 MED ORDER — OXYCODONE HCL 5 MG PO TABS
ORAL_TABLET | ORAL | Status: AC
  Filled 2020-03-03: qty 1

## 2020-03-03 MED ORDER — ONDANSETRON HCL 4 MG PO TABS: 4.0000 mg | ORAL_TABLET | Freq: Four times a day (QID) | ORAL | Status: DC | PRN

## 2020-03-03 MED ORDER — PROMETHAZINE HCL 25 MG/ML IJ SOLN: 6.2500 mg | INTRAMUSCULAR | Status: DC | PRN

## 2020-03-03 MED ORDER — OXYCODONE HCL 5 MG/5ML PO SOLN: 5.0000 mg | Freq: Once | ORAL | Status: AC | PRN

## 2020-03-03 MED ORDER — TAMSULOSIN HCL 0.4 MG PO CAPS
0.4000 mg | ORAL_CAPSULE | Freq: Every day | ORAL | Status: DC
  Administered 2020-03-03 – 2020-03-05 (×3): 0.4 mg via ORAL
  Filled 2020-03-03 (×3): qty 1

## 2020-03-03 MED ORDER — HYDROMORPHONE HCL 1 MG/ML IJ SOLN
0.2500 mg | INTRAMUSCULAR | Status: DC | PRN
  Administered 2020-03-03: 0.5 mg via INTRAVENOUS

## 2020-03-03 MED ORDER — HYDROMORPHONE HCL 1 MG/ML IJ SOLN
INTRAMUSCULAR | Status: AC
  Filled 2020-03-03: qty 1

## 2020-03-03 MED ORDER — ONDANSETRON HCL 4 MG/2ML IJ SOLN: 4.0000 mg | Freq: Four times a day (QID) | INTRAMUSCULAR | Status: DC | PRN

## 2020-03-03 MED ORDER — SUCRALFATE 1 GM/10ML PO SUSP
1.0000 g | Freq: Three times a day (TID) | ORAL | Status: DC
  Administered 2020-03-03 – 2020-03-06 (×10): 1 g via ORAL
  Filled 2020-03-03 (×10): qty 10

## 2020-03-03 MED ORDER — LEVOTHYROXINE SODIUM 88 MCG PO TABS
88.0000 ug | ORAL_TABLET | Freq: Every day | ORAL | Status: DC
  Administered 2020-03-04: 88 ug via ORAL
  Filled 2020-03-03: qty 1

## 2020-03-03 MED ORDER — PHENYLEPHRINE 40 MCG/ML (10ML) SYRINGE FOR IV PUSH (FOR BLOOD PRESSURE SUPPORT)
PREFILLED_SYRINGE | INTRAVENOUS | Status: DC | PRN
  Administered 2020-03-03 (×2): 80 ug via INTRAVENOUS

## 2020-03-03 MED ORDER — SPOT INK MARKER SYRINGE KIT
PACK | SUBMUCOSAL | Status: AC
  Filled 2020-03-03: qty 5

## 2020-03-03 MED ORDER — ACETAMINOPHEN 325 MG PO TABS: 650.0000 mg | ORAL_TABLET | Freq: Four times a day (QID) | ORAL | Status: DC | PRN

## 2020-03-03 MED ORDER — MIDAZOLAM HCL 2 MG/2ML IJ SOLN: 0.5000 mg | Freq: Once | INTRAMUSCULAR | Status: DC | PRN

## 2020-03-03 MED ORDER — PROPOFOL 10 MG/ML IV BOLUS
INTRAVENOUS | Status: DC | PRN
  Administered 2020-03-03: 20 mg via INTRAVENOUS
  Administered 2020-03-03: 10 mg via INTRAVENOUS

## 2020-03-03 MED ORDER — LIDOCAINE 2% (20 MG/ML) 5 ML SYRINGE
INTRAMUSCULAR | Status: DC | PRN
  Administered 2020-03-03: 60 mg via INTRAVENOUS

## 2020-03-03 MED ORDER — SODIUM CHLORIDE 0.9 % IV SOLN: INTRAVENOUS | Status: DC

## 2020-03-03 MED ORDER — HYDROCORTISONE NA SUCCINATE PF 100 MG IJ SOLR
50.0000 mg | Freq: Four times a day (QID) | INTRAMUSCULAR | Status: DC
  Administered 2020-03-03 – 2020-03-04 (×4): 50 mg via INTRAVENOUS
  Filled 2020-03-03 (×4): qty 2

## 2020-03-03 MED ORDER — SODIUM CHLORIDE 0.9 % IV SOLN
80.0000 mg | Freq: Once | INTRAVENOUS | Status: AC
  Administered 2020-03-03: 80 mg via INTRAVENOUS
  Filled 2020-03-03 (×2): qty 80

## 2020-03-03 MED ORDER — SODIUM CHLORIDE 0.9% FLUSH
3.0000 mL | Freq: Two times a day (BID) | INTRAVENOUS | Status: DC
  Administered 2020-03-03 – 2020-03-06 (×3): 3 mL via INTRAVENOUS

## 2020-03-03 MED ORDER — EPINEPHRINE 1 MG/10ML IJ SOSY
PREFILLED_SYRINGE | INTRAMUSCULAR | Status: AC
  Filled 2020-03-03: qty 10

## 2020-03-03 MED ORDER — OXYCODONE HCL 10 MG PO TABS: 10.0000 mg | ORAL_TABLET | Freq: Four times a day (QID) | ORAL | Status: DC | PRN

## 2020-03-03 MED ORDER — MORPHINE SULFATE (PF) 2 MG/ML IV SOLN
2.0000 mg | INTRAVENOUS | Status: DC | PRN
  Administered 2020-03-03 – 2020-03-05 (×3): 2 mg via INTRAVENOUS
  Filled 2020-03-03 (×3): qty 1

## 2020-03-03 MED ORDER — CLONAZEPAM 0.5 MG PO TABS
1.0000 mg | ORAL_TABLET | Freq: Every day | ORAL | Status: DC | PRN
  Administered 2020-03-05: 1 mg via ORAL
  Filled 2020-03-03: qty 2

## 2020-03-03 MED ORDER — LACTATED RINGERS IV SOLN: INTRAVENOUS | Status: DC

## 2020-03-03 MED ORDER — OXYCODONE HCL 5 MG PO TABS
10.0000 mg | ORAL_TABLET | Freq: Four times a day (QID) | ORAL | Status: DC | PRN
  Administered 2020-03-03 – 2020-03-06 (×10): 10 mg via ORAL
  Filled 2020-03-03 (×12): qty 2

## 2020-03-03 MED ORDER — ESCITALOPRAM OXALATE 10 MG PO TABS
10.0000 mg | ORAL_TABLET | Freq: Every day | ORAL | Status: DC
  Administered 2020-03-03 – 2020-03-05 (×3): 10 mg via ORAL
  Filled 2020-03-03 (×3): qty 1

## 2020-03-03 MED ORDER — HYDRALAZINE HCL 20 MG/ML IJ SOLN: 5.0000 mg | INTRAMUSCULAR | Status: DC | PRN

## 2020-03-03 MED ORDER — PROPOFOL 500 MG/50ML IV EMUL
INTRAVENOUS | Status: DC | PRN
  Administered 2020-03-03: 100 ug/kg/min via INTRAVENOUS

## 2020-03-03 MED ORDER — MEPERIDINE HCL 100 MG/ML IJ SOLN: 6.2500 mg | INTRAMUSCULAR | Status: DC | PRN

## 2020-03-03 MED ORDER — ACETAMINOPHEN 650 MG RE SUPP: 650.0000 mg | Freq: Four times a day (QID) | RECTAL | Status: DC | PRN

## 2020-03-03 SURGICAL SUPPLY — 14 items

## 2020-03-03 NOTE — Progress Notes (Signed)
Patient had generally reassuring EGD.  Hgb is 9.1 and he remains hemodynamically stable post-procedure.  Will change from progressive to telemetry in order to facilitate bed placement.   Carlyon Shadow, M.D.

## 2020-03-03 NOTE — ED Notes (Signed)
Transported to ENDO.  

## 2020-03-03 NOTE — ED Triage Notes (Signed)
Pt reports rectal bleeding since this morning.  Denies pain.  States he was recently admitted for same.  Pt pale.

## 2020-03-03 NOTE — Anesthesia Postprocedure Evaluation (Signed)
Anesthesia Post Note  Patient: Maxwell Fox  Procedure(s) Performed: ESOPHAGOGASTRODUODENOSCOPY (EGD) WITH PROPOFOL (N/A )     Patient location during evaluation: PACU Anesthesia Type: MAC Level of consciousness: awake and alert, patient cooperative and oriented Pain management: pain level controlled Vital Signs Assessment: post-procedure vital signs reviewed and stable Respiratory status: spontaneous breathing, nonlabored ventilation and respiratory function stable Cardiovascular status: blood pressure returned to baseline and stable Postop Assessment: no apparent nausea or vomiting Anesthetic complications: no   No complications documented.               Midge Minium

## 2020-03-03 NOTE — H&P (Signed)
History and Physical    Maxwell Fox [id number] DOB: 1945/05/05 DOA: 03/03/2020  PCP: [name], MD Consultants:  Tyrone Nine - rad onc; Cruzita Lederer - endocrinology; [facility] - cardiology; [facility] - oncology; [facility] - GI Patient coming from:  Home - lives with wife; NOK: Daughter, Tavian Ferretiz, [phone]  Chief Complaint: GI bleeding  HPI: Maxwell Fox is a 75 y.o. male with medical history significant of Roux-en-Y (2000); chronic adrenal insufficiency on prednisone; depression/anxiety; hypothyroidism; metastatic melanoma s/p resection of brain mets and immunotherapy; pelvic fracture (01/2020) and recent admissions for UGI bleeding presenting with recurrent upper GI bleed.  He was hospitalized from 1/12-18 and EGD showed grade B reflux esophagitis without bleeding; 2 marginal ulcers at the area of anastomosis with a few surgical staples and at least 1 visible suture.  He was placed on Protonix BID; given his adrenal insufficiency he was also given Solu-Cortef.  Because of his recent pelvic fracture, he had been given Toradol and so PO NSAIDs despite his prior h/o gastric bypass.  He was admitted again from 1/24-27 and had EGD with non-bleeding cratered anastomotic ulcer with a visible vessel, treated with Epi and a bipolar probe; during that hospitalization he required ICU stay for hemorrhagic shock and required 2u PRBC and Protonix.  He returns with rectal bleeding since this AM.  He has had 3 BMs since hospital d/c and thinks there was dark blood in both the earlier stools but this AM the commode was full of blood.  He has not had pain.  No further NSAIDs since initial bleed.    ED Course:  Scope at 1PM by Dr. Benson Norway for recurrent GI bleeding.  Hgb 9.9, better than at the time of d/c.  Heme positive.  Hemodynamically stable currently.  Review of Systems: As per HPI; otherwise review of systems reviewed and negative.    COVID Vaccine Status:   Complete plus booster  Past Medical  History:  Diagnosis Date  . B12 nutritional deficiency    malabsorbtion  . BPH (benign prostatic hypertrophy)   . Diverticulosis   . GI bleeding 02/2020  . Hypertension   . Iron deficiency anemia    malabsorption related (s/p gastric bypass)  . Melanoma ([place])   . Obesity    s/p gastric bypass 2000, start 355#  . Pancreatic cyst   . Pulmonary nodules     Past Surgical History:  Procedure Laterality Date  . CRANIOTOMY FOR TUMOR  12/11/2015   metastatic melanoma  . ESOPHAGOGASTRODUODENOSCOPY (EGD) WITH PROPOFOL N/A 02/14/2020   Procedure: ESOPHAGOGASTRODUODENOSCOPY (EGD) WITH PROPOFOL;  Surgeon: Lavena Bullion, DO;  Location: [facility] ENDOSCOPY;  Service: Endoscopy;  Laterality: N/A;  . ESOPHAGOGASTRODUODENOSCOPY (EGD) WITH PROPOFOL N/A 02/26/2020   Procedure: ESOPHAGOGASTRODUODENOSCOPY (EGD) WITH PROPOFOL;  Surgeon: Irene Shipper, MD;  Location: [facility] ENDOSCOPY;  Service: Endoscopy;  Laterality: N/A;  . HEMOSTASIS CONTROL  02/26/2020   Procedure: HEMOSTASIS CONTROL;  Surgeon: Irene Shipper, MD;  Location: [place];  Service: Endoscopy;;  . HOT HEMOSTASIS N/A 02/26/2020   Procedure: HOT HEMOSTASIS (ARGON PLASMA COAGULATION/BICAP);  Surgeon: Irene Shipper, MD;  Location: [facility] ENDOSCOPY;  Service: Endoscopy;  Laterality: N/A;  . MOHS SURGERY  2012   melanoma, mid back, [place]  . ROUX-EN-Y PROCEDURE  2000  . TOTAL SHOULDER REPLACEMENT Right 2011   [place]    Social History   Socioeconomic History  . Marital status: Married    Spouse name: Not on file  . Number of children: 2  . Years of  education: Not on file  . Highest education level: Not on file  Occupational History  . Not on file  Tobacco Use  . Smoking status: Former Smoker    Quit date: 02/03/1963    Years since quitting: 42.1  . Smokeless tobacco: Never Used  Vaping Use  . Vaping Use: Never used  Substance and Sexual Activity  . Alcohol use: Not Currently    Comment: wine 2x/week  . Drug use: Never  . Sexual  activity: Not on file  Other Topics Concern  . 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 Worried About Charity fundraiser in the Last Year: Never true  . Ran Out of Food in the Last Year: Never true  Transportation Needs: No Transportation Needs  . Lack of Transportation (Medical): No  . Lack of Transportation (Non-Medical): No  Physical Activity: Not on file  Stress: Not on file  Social Connections: Not on file  Intimate Partner Violence: Not on file    No Known Allergies  Family History  Problem Relation Age of Onset  . Hypertension Father   . Macular degeneration Mother   . Melanoma Mother   . Melanoma Sister   . Breast cancer Sister   . Uterine cancer Sister   . Stomach cancer Sister   . Melanoma Brother     Prior to Admission medications   Medication Sig Start Date End Date Taking? Authorizing Provider  calcium carbonate (OS-CAL - DOSED IN MG OF ELEMENTAL CALCIUM) 1250 (500 Ca) MG tablet Take 1 tablet by mouth every evening.    [provider]  Cholecalciferol (VITAMIN D) 2000 UNITS tablet Take 1 tablet (2,000 Units total) by mouth daily. 10/26/13   [name], MD  clonazePAM (KLONOPIN) 1 MG tablet Take 1 tablet (1 mg total) by mouth daily as needed. Patient taking differently: Take 1 mg by mouth daily as needed for anxiety. 01/22/20   Oswald Hillock, MD  escitalopram (LEXAPRO) 10 MG tablet TAKE 1 TABLET(10 MG) BY MOUTH DAILY Patient taking differently: Take 10 mg by mouth at bedtime. 11/23/19   [name], MD  levothyroxine (SYNTHROID) 88 MCG tablet Take 1 tablet (88 mcg total) by mouth daily before breakfast. 06/26/19   [name], MD  Multiple Vitamins-Minerals (MULTIVITAMIN ADULTS) TABS Take 1 tablet by mouth every evening.    [provider]  Oxycodone HCl 10 MG TABS Take 1 tablet (10 mg total) by mouth every 6 (six) hours as needed. 02/29/20   Shelly Coss, MD  pantoprazole (PROTONIX) 40 MG tablet Take 1 tablet (40 mg total) by mouth 2 (two) times daily for 60 days, THEN 1 tablet (40 mg total) daily. 02/20/20 05/20/20  Pokhrel, Corrie Mckusick, MD  polyethylene glycol (MIRALAX / GLYCOLAX) 17 g packet Take 17 g by mouth daily as needed for mild constipation or moderate constipation. 02/20/20   Pokhrel, Corrie Mckusick, MD  predniSONE (DELTASONE) 5 MG tablet Take 1 tablet (5 mg total) by mouth daily with breakfast. 06/26/19   [name], MD  sucralfate (CARAFATE) 1 GM/10ML suspension Take 10 mLs (1 g total) by mouth 4 (four) times daily -  with meals and at bedtime for 14 days. 02/29/20 03/14/20  Shelly Coss, MD  tamsulosin (FLOMAX) 0.4 MG  CAPS capsule TAKE 1 CAPSULE(0.4 MG) BY MOUTH DAILY AFTER SUPPER Patient taking differently: Take 0.4 mg by mouth daily after supper. 11/30/19   [name], MD    Physical Exam: Vitals:   03/03/20 0933 03/03/20 1034 03/03/20 1045  BP: 103/71 (!) 149/125 96/65  Pulse: 67 (!) 57 (!) 57  Resp: 20 14 18   Temp: 97.8 F (36.6 C)    TempSrc: Oral    SpO2: 100% 100% 100%  Weight: 86.2 kg    Height: 5\' 11"  (1.803 m)       . General:  Appears calm and comfortable and is in NAD . Eyes:  EOMI, normal lids, iris . ENT:  grossly normal hearing, lips & tongue, mmm . Neck:  no LAD, masses or thyromegaly . Cardiovascular:  RRR, no m/r/g. No LE edema.  Marland Kitchen Respiratory:   CTA bilaterally with no wheezes/rales/rhonchi.  Normal respiratory effort. . Abdomen:  soft, NT, ND, NABS . Skin:  no rash or induration seen on limited exam . Musculoskeletal:  grossly normal tone BUE/BLE, good ROM, no bony abnormality . Psychiatric:  grossly normal mood and affect, speech fluent and appropriate, AOx3 . Neurologic:  CN 2-12 grossly intact, moves all extremities in coordinated fashion    Radiological Exams on  Admission: Independently reviewed - see discussion in A/P where applicable  No results found.  EKG: not done   Labs on Admission: I have personally reviewed the available labs and imaging studies at the time of the admission.  Pertinent labs:   Unremarkable CMP WBC 6.6 Hgb 9.9; 8.9 on 1/25 Heme positive COVID pending   Assessment/Plan Principal Problem:   Acute gastric ulcer with hemorrhage Active Problems:   Benign prostatic hyperplasia   Melanoma metastatic to brain [facility])   Adrenal insufficiency due to cancer therapy ([place])   Secondary hypothyroidism    Recurrent upper GI bleeding -Patient' is presenting with melena and hematochezia, suggestive of upper GI bleeding. -He has h/o gastric bypass and recent EGDs have indicated anastomotic ulcers; this is most likely the bleeding source again -Chronic prednisone is likely contributing to the recurrent bleeding -The patient is not tachycardic with normal blood pressure at this time.  -Will admit to progressive bed given high risk for recurrent bleeding -GI consulted by ED -NPO for EGD today - would suggest repeat EGD in 72 hours, after completion of Protonix and prior to d/c -NS at 100 mL/hr -Start IV pantoprazole bolus and infusion given frank bleeding with concern for hemodynamic instability -Zofran IV for nausea -Avoid NSAIDs and SQ heparin -Maintain IV access (2 large bore IVs if possible). -His Hgb is currently elevated compared to prior but suspect that this is reflective of hemoconcentration.  -Type and screen ordered by ED.  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 -In remission, no new lesions on most recent imaging in 8.2021 -Immunotherapy has been held since 2018 due to development of adrenal insufficiency and hypothyroidism -Late for f/u, which was due in  December at [facility] outpatient f/u scheduled shortly after release from this hospitalization  Chronic adrenal insufficiency -Chronic prednisone may be increasing bleeding risk -Given increased physiological stress from recurrent bleeding, will start stress-dosed steroids - hydrocortisone 50 mg IV q6h  BPH -Continue Flomax  Hypothyroidism -Secondary to immunotherapy -Check TSH -Continue Synthroid at current dose for now  Recent pubic rami fractures -Patient is advancing  with therapy and walking with a walker -Needs ongoing oxycodone to assist with recovery, and this is reasonable -NSAIDs are clearly contraindicated -HH PT/OT  Depression/anxiety -Continue Lexapro, Klonopin    Note: This patient has been tested and is negative for the novel coronavirus COVID-19. He has been fully vaccinated against COVID-19.     DVT prophylaxis: SCDs Code Status:  Full - confirmed with patient/family Family Communication: Daughter was present throughout evaluation Disposition Plan:  The patient is from: home  Anticipated d/c is to: home with [facility] services   Anticipated d/c date will depend on clinical response to treatment, but likely after completion of Protonix IV x 72 hours (and possibly repeat EGD)  Patient is currently: acutely ill Consults called: GI Admission status: Admit - It is my clinical opinion that admission to [place] is reasonable and necessary because of the expectation that this patient will require hospital care that crosses at least 2 midnights to treat this condition based on the medical complexity of the problems presented.  Given the aforementioned information, the predictability of an adverse outcome is felt to be significant.     Karmen Bongo MD [facility]   How to contact the [facility] Attending or Consulting provider [place] or covering provider during after hours [place], for this patient?  1. Check the care team in [facility] and look for a)  attending/consulting TR[facility] provider listed and b) the [facility] team listed 2. Log into [email or web address] and use [place]'s universal password to access. If you do not have the password, please contact the hospital operator. 3. Locate the [facility] provider you are looking for under [facility] and page to a number that you can be directly reached. 4. If you still have difficulty reaching the provider, please page the [facility] (Director on Call) for the Hospitalists listed on amion for assistance.   03/03/2020, 11:28 AM

## 2020-03-03 NOTE — Op Note (Signed)
[facility] Patient Name: Maxwell Fox Procedure Date : 03/03/2020 MRN: [id number] Attending MD: Carol Ada , MD Date of Birth: 1945/05/19 CSN: [id number] Age: 75 Admit Type: Inpatient Procedure:                Upper GI endoscopy Indications:              Hematochezia, Melena Providers:                Carol Ada, MD, Glori Bickers, RN, Laverda Sorenson,                            Technician, Baron Sane, CRNA Referring MD:              Medicines:                Propofol per Anesthesia Complications:            No immediate complications. Estimated Blood Loss:     Estimated blood loss: none. Procedure:                Pre-Anesthesia Assessment:                           - Prior to the procedure, a History and Physical                            was performed, and patient medications and                            allergies were reviewed. The patient's tolerance of                            previous anesthesia was also reviewed. The risks                            and benefits of the procedure and the sedation                            options and risks were discussed with the patient.                            All questions were answered, and informed consent                            was obtained. Prior Anticoagulants: The patient has                            taken no previous anticoagulant or antiplatelet                            agents. ASA Grade Assessment: III - A patient with                            severe systemic disease. After reviewing the risks  and benefits, the patient was deemed in                            satisfactory condition to undergo the procedure.                           - Sedation was administered by an anesthesia                            professional. Deep sedation was attained.                           After obtaining informed consent, the endoscope was                            passed under  direct vision. Throughout the                            procedure, the patient's blood pressure, pulse, and                            oxygen saturations were monitored continuously. The                            GIF-H190 ([id number]) Olympus gastroscope was                            introduced through the mouth, and advanced to the                            afferent and efferent jejunal loops. The upper GI                            endoscopy was accomplished without difficulty. The                            patient tolerated the procedure well. Scope In: Scope Out: Findings:      The esophagus was normal.      One non-bleeding superficial gastric ulcer with no stigmata of bleeding       was found at the anastomosis. The lesion was 8 mm in largest dimension.      The examined jejunum was normal.      The Roux-en-Y gastric pouch was 3-4 cm in length. There was evidence of       a healing marginal ulcer. Some friability was noted at the surgical       staples when the endoscope passed by the area. Deep intubation in to the       jejunum was achieved and there was no evidence of any fresh or old       blood. There was no evidence of any suspicious lesions to suggest a       melenoma focus in the examined small bowel. Impression:               - Normal esophagus.                           -  Non-bleeding gastric ulcer with no stigmata of                            bleeding.                           - Normal examined jejunum.                           - No specimens collected. Recommendation:           - Return patient to hospital ward for ongoing care.                           - Resume regular diet.                           - Continue present medications.                           - Follow HGB and transfuse if necessary.                           - If the bleeding persists, further evaluation with                            a colonoscopy and/or imaging for metastatic                             melenoma is necessary. Procedure Code(s):        --- Professional ---                           [phone], Esophagogastroduodenoscopy, flexible,                            transoral; diagnostic, including collection of                            specimen(s) by brushing or washing, when performed                            (separate procedure) Diagnosis Code(s):        --- Professional ---                           K25.9, Gastric ulcer, unspecified as acute or                            chronic, without hemorrhage or perforation                           K92.1, Melena (includes Hematochezia) CPT copyright 2019 American Medical Association. All rights reserved. The codes documented in this report are preliminary and upon coder review may  be revised to meet current compliance requirements. Carol Ada, MD Carol Ada, MD 03/03/2020 1:17:33 PM This report has been signed electronically. Number of Addenda: 0

## 2020-03-03 NOTE — Progress Notes (Signed)
The patient called this AM reporting that he had evidence of hematochezia.  He was recently discharged for marginal ulcer bleeding on 02/29/2020.  The patient presented to the ER on 02/14/2020 and 02/26/2020.  Both times he underwent an emergent EGD.  The first EGD was negative for any active bleeding but he had two deep marginal ulcers secondary to NSAID use.  He reports being "religious" sith his PPI and sucralfate.  A recurrent bleed occurred on 02/26/2020 and his HGB dropped down into the 5 g/dL range.  He was very concerned this AM that his bleeding this AM was an harbinger of more severe bleeding.  During the 02/26/2020 EGD a nonbleeding visible vessel was found and it was treated with Epi and BICAP.  No further bleeding ensued after the procedure with observation in the hospital.  With a stable HGB he was discharged home.  There is a history of metastatic melenoma and he responded to immunotherapy.  Per his knowledge he is in remission, but he is aware of the spector of recurrence.  With the severity of his anemia recently, and the high risk findings for bleeding, a repeat EGD will be performed today.  If there is no clear evidence of bleeding from the marginal ulcers, further work up for metastatic melenoma is required.  Melenoma is known to metastasize to the small bowel and cause GI bleeding.

## 2020-03-03 NOTE — Anesthesia Preprocedure Evaluation (Addendum)
Anesthesia Evaluation  Patient identified by MRN, date of birth, ID band Patient awake    Reviewed: Allergy & Precautions, NPO status , Patient's Chart, lab work & pertinent test results  History of Anesthesia Complications Negative for: history of anesthetic complications  Airway Mallampati: II  TM Distance: >3 FB Neck ROM: Full    Dental  (+) Implants, Dental Advisory Given   Pulmonary former smoker,  03/03/2020 SARS coronavirus NEG   breath sounds clear to auscultation       Cardiovascular hypertension, (-) angina Rhythm:Regular Rate:Normal  '20 ECHO: EF 50%, grade 1 DD, mild TR, mild MR   Neuro/Psych Anxiety Depression negative neurological ROS     GI/Hepatic Neg liver ROS, GERD  Medicated and Controlled,GI bleed S/p gastric bypass   Endo/Other  Hypothyroidism   Renal/GU negative Renal ROS     Musculoskeletal   Abdominal   Peds  Hematology  (+) Blood dyscrasia (Hb 9.9), anemia ,   Anesthesia Other Findings   Reproductive/Obstetrics                            Anesthesia Physical Anesthesia Plan  ASA: III  Anesthesia Plan: MAC   Post-op Pain Management:    Induction:   PONV Risk Score and Plan: 1 and Treatment may vary due to age or medical condition  Airway Management Planned: Natural Airway and Nasal Cannula  Additional Equipment: None  Intra-op Plan:   Post-operative Plan:   Informed Consent: I have reviewed the patients History and Physical, chart, labs and discussed the procedure including the risks, benefits and alternatives for the proposed anesthesia with the patient or authorized representative who has indicated his/her understanding and acceptance.     Dental advisory given  Plan Discussed with: CRNA and Surgeon  Anesthesia Plan Comments:        Anesthesia Quick Evaluation

## 2020-03-03 NOTE — Transfer of Care (Signed)
Immediate Anesthesia Transfer of Care Note  Patient: Maxwell Fox  Procedure(s) Performed: ESOPHAGOGASTRODUODENOSCOPY (EGD) WITH PROPOFOL (N/A )  Patient Location: PACU  Anesthesia Type:MAC  Level of Consciousness: awake, alert  and drowsy  Airway & Oxygen Therapy: Patient Spontanous Breathing  Post-op Assessment: Report given to RN, Post -op Vital signs reviewed and stable and Patient moving all extremities  Post vital signs: Reviewed and stable  Last Vitals:  Vitals Value Taken Time  BP 93/58 03/03/20 1308  Temp    Pulse 59 03/03/20 1309  Resp 18 03/03/20 1309  SpO2 99 % 03/03/20 1309  Vitals shown include unvalidated device data.  Last Pain:  Vitals:   03/03/20 1227  TempSrc: Temporal  PainSc: 3          Complications: No complications documented.

## 2020-03-03 NOTE — ED Provider Notes (Signed)
[facility] EMERGENCY DEPARTMENT Provider Note   CSN: [id number] Arrival date & time: 03/03/20  6967     History Chief Complaint  Patient presents with  . Rectal Bleeding    Maxwell Fox is a 75 y.o. male.  HPI  Patient is a 75 year old male with history of recent hospitalization for anemia secondary to anastomotic ulcers.  He had EGD done on 02/14/2020 which is diagnostic and had epinephrine and gold probe treatment 02/26/2020.  He was discharged with hemoglobin of 8.3.   He states he still was having somewhat dark appearing stool at time of discharge he states that this seem to be improving however this morning when he was restroom he states he had a normal consistency bowel movement that was followed by some liquid he noticed that a close respiration of this was bright red blood.  He states he does not know how much came out but he noticed that the toilet bowl water was red.  He denies any abdominal pain, nausea, vomiting, shortness of breath, lightheadedness, dizziness.  Does not feel more fatigued than usual.  He states that he called his GI doctor Dr. Benson Norway and was told to come to the emergency department.  Patient's daughter is at bedside.  She states he does not look more pale than normal.     Past Medical History:  Diagnosis Date  . B12 nutritional deficiency    malabsorbtion  . BPH (benign prostatic hypertrophy)   . Diverticulosis   . GI bleeding 02/2020  . Hypertension   . Iron deficiency anemia    malabsorption related (s/p gastric bypass)  . Melanoma ([place])   . Obesity    s/p gastric bypass 2000, start 355#  . Pancreatic cyst   . Pulmonary nodules     Patient Active Problem List   Diagnosis Date Noted  . History of Roux-en-Y gastric bypass   . Hemorrhagic shock ([place])   . Acute gastric ulcer with hemorrhage   . Acute GI bleeding 02/14/2020  . Acute blood loss anemia 02/14/2020  . GI bleed 02/14/2020  . Melena   . Marginal ulcers   .  Gastroesophageal reflux disease with esophagitis without hemorrhage   . Closed fracture of left superior pubic ramus ([place]) 02/09/2020  . Anxiety and depression 01/10/2018  . History of antineoplastic chemotherapy 07/19/2017  . Chronic midline low back pain without sciatica 04/19/2017  . Metastatic melanoma ([place]) 01/07/2017  . Adrenal insufficiency due to cancer therapy ([place]) 07/27/2016  . Secondary hypothyroidism 07/27/2016  . H/O gastric bypass 01/06/2016  . Pancreatic cyst 01/06/2016  . Melanoma metastatic to brain ([place]) 12/31/2015  . Iron deficiency anemia   . B12 nutritional deficiency   . Benign prostatic hyperplasia     Past Surgical History:  Procedure Laterality Date  . CRANIOTOMY FOR TUMOR  12/11/2015   metastatic melanoma  . ESOPHAGOGASTRODUODENOSCOPY (EGD) WITH PROPOFOL N/A 02/14/2020   Procedure: ESOPHAGOGASTRODUODENOSCOPY (EGD) WITH PROPOFOL;  Surgeon: Lavena Bullion, DO;  Location: [facility];  Service: Endoscopy;  Laterality: N/A;  . ESOPHAGOGASTRODUODENOSCOPY (EGD) WITH PROPOFOL N/A 02/26/2020   Procedure: ESOPHAGOGASTRODUODENOSCOPY (EGD) WITH PROPOFOL;  Surgeon: Irene Shipper, MD;  Location[facility]COPY;  Service: Endoscopy;  Laterality: N/A;  . HEMOSTASIS CONTROL  02/26/2020   Procedure: HEMOSTASIS CONTROL;  Surgeon: Irene Shipper, MD;  Location: [facility];  Service: Endoscopy;;  . HOT HEMOSTASIS N/A 02/26/2020   Procedure: HOT HEMOSTASIS (ARGON PLASMA COAGULATION/BICAP);  Surgeon: Irene Shipper, MD;  Location: [facility] ENDOSCOPY;  Service: Endoscopy;  Laterality: N/A;  . MOHS SURGERY  2012   melanoma, mid back, [place]  . ROUX-EN-Y PROCEDURE  2000  . TOTAL SHOULDER REPLACEMENT Right 2011   [place]       Family History  Problem Relation Age of Onset  . Hypertension Father   . Macular degeneration Mother   . Melanoma Mother   . Melanoma Sister   . Breast cancer Sister   . Uterine cancer Sister   . Stomach cancer Sister   . Melanoma Brother      Social History   Tobacco Use  . Smoking status: Former Smoker    Quit date: 02/03/1963    Years since quitting: 41.1  . Smokeless tobacco: Never Used  Vaping Use  . Vaping Use: Never used  Substance Use Topics  . Alcohol use: Not Currently    Comment: wine 2x/week  . Drug use: Never    Home Medications Prior to Admission medications   Medication Sig Start Date End Date Taking? Authorizing Provider  calcium carbonate (OS-CAL - DOSED IN MG OF ELEMENTAL CALCIUM) 1250 (500 Ca) MG tablet Take 1 tablet by mouth every evening.    [provider]  Cholecalciferol (VITAMIN D) 2000 UNITS tablet Take 1 tablet (2,000 Units total) by mouth daily. 10/26/13   [name], MD  clonazePAM (KLONOPIN) 1 MG tablet Take 1 tablet (1 mg total) by mouth daily as needed. Patient taking differently: Take 1 mg by mouth daily as needed for anxiety. 01/22/20   Oswald Hillock, MD  escitalopram (LEXAPRO) 10 MG tablet TAKE 1 TABLET(10 MG) BY MOUTH DAILY Patient taking differently: Take 10 mg by mouth at bedtime. 11/23/19   [name], MD  levothyroxine (SYNTHROID) 88 MCG tablet Take 1 tablet (88 mcg total) by mouth daily before breakfast. 06/26/19   [name], MD  Multiple Vitamins-Minerals (MULTIVITAMIN ADULTS) TABS Take 1 tablet by mouth every evening.    [provider]  Oxycodone HCl 10 MG TABS Take 1 tablet (10 mg total) by mouth every 6 (six) hours as needed. 02/29/20   Shelly Coss, MD  pantoprazole (PROTONIX) 40 MG tablet Take 1 tablet (40 mg total) by mouth 2 (two) times daily for 60 days, THEN 1 tablet (40 mg total) daily. 02/20/20 05/20/20  Pokhrel, Corrie Mckusick, MD  polyethylene glycol (MIRALAX / GLYCOLAX) 17 g packet Take 17 g by mouth daily as needed for mild constipation or moderate constipation. 02/20/20   Pokhrel, Corrie Mckusick, MD  predniSONE (DELTASONE) 5 MG tablet Take 1 tablet (5 mg total) by mouth daily with breakfast. 06/26/19   [name], MD  sucralfate  (CARAFATE) 1 GM/10ML suspension Take 10 mLs (1 g total) by mouth 4 (four) times daily -  with meals and at bedtime for 14 days. 02/29/20 03/14/20  Shelly Coss, MD  tamsulosin (FLOMAX) 0.4 MG CAPS capsule TAKE 1 CAPSULE(0.4 MG) BY MOUTH DAILY AFTER SUPPER Patient taking differently: Take 0.4 mg by mouth daily after supper. 11/30/19   [name], MD    Allergies    Patient has no known allergies.  Review of Systems   Review of Systems  Constitutional: Negative for chills and fever.  HENT: Negative for congestion.   Eyes: Negative for pain.  Respiratory: Negative for cough and shortness of breath.   Cardiovascular: Negative for chest pain and leg swelling.  Gastrointestinal: Positive for blood in stool. Negative for abdominal pain, diarrhea, nausea and vomiting.  Genitourinary: Negative for dysuria.  Musculoskeletal: Negative for  myalgias.  Skin: Negative for rash.  Neurological: Negative for dizziness and headaches.    Physical Exam Updated Vital Signs BP 119/76 (BP Location: Right Arm)   Pulse (!) 56   Temp 97.8 F (36.6 C)   Resp 15   Ht 5\' 11"  (1.803 m)   Wt 86.2 kg   SpO2 97%   BMI 26.50 kg/m   Physical Exam Vitals and nursing note reviewed.  Constitutional:      General: He is not in acute distress. HENT:     Head: Normocephalic and atraumatic.     Nose: Nose normal.  Eyes:     General: No scleral icterus. Cardiovascular:     Rate and Rhythm: Normal rate and regular rhythm.     Pulses: Normal pulses.     Heart sounds: Normal heart sounds.  Pulmonary:     Effort: Pulmonary effort is normal. No respiratory distress.     Breath sounds: No wheezing.  Abdominal:     Palpations: Abdomen is soft.     Tenderness: There is no abdominal tenderness.     Comments: Soft nontender abdomen.  No palpable hepatomegaly.  Genitourinary:    Comments: Rectal exam noted for scant brown stool.  No obvious melena.  There is some dried blood around the patient's anus  however. Guaiac positive Musculoskeletal:     Cervical back: Normal range of motion.     Right lower leg: No edema.     Left lower leg: No edema.  Skin:    General: Skin is warm and dry.     Capillary Refill: Capillary refill takes less than 2 seconds.  Neurological:     Mental Status: He is alert. Mental status is at baseline.  Psychiatric:        Mood and Affect: Mood normal.        Behavior: Behavior normal.     ED Results / Procedures / Treatments   Labs (all labs ordered are listed, but only abnormal results are displayed) Labs Reviewed  COMPREHENSIVE METABOLIC PANEL - Abnormal; Notable for the following components:      Result Value   Calcium 8.3 (*)    Total Protein 4.9 (*)    Albumin 2.6 (*)    All other components within normal limits  CBC - Abnormal; Notable for the following components:   RBC 3.17 (*)    Hemoglobin 9.9 (*)    HCT 32.2 (*)    MCV 101.6 (*)    All other components within normal limits  CBC - Abnormal; Notable for the following components:   RBC 2.86 (*)    Hemoglobin 9.1 (*)    HCT 28.5 (*)    All other components within normal limits  POC OCCULT BLOOD, ED - Abnormal; Notable for the following components:   Fecal Occult Bld POSITIVE (*)    All other components within normal limits  SARS CORONAVIRUS 2 BY RT PCR (HOSPITAL ORDER, [place])  TSH  TYPE AND SCREEN    EKG None  Radiology No results found.  Procedures Procedures   Medications Ordered in ED Medications  0.9 %  sodium chloride infusion ( Intravenous Anesthesia Volume Adjustment 03/03/20 1300)  Oxycodone HCl TABS 10 mg (has no administration in time range)  escitalopram (LEXAPRO) tablet 10 mg (has no administration in time range)  levothyroxine (SYNTHROID) tablet 88 mcg (has no administration in time range)  hydrocortisone sodium succinate (SOLU-CORTEF) 100 MG injection 50 mg (has no administration in  time range)  sucralfate (CARAFATE) 1 GM/10ML  suspension 1 g (has no administration in time range)  tamsulosin (FLOMAX) capsule 0.4 mg (has no administration in time range)  clonazePAM (KLONOPIN) tablet 1 mg (has no administration in time range)  lactated ringers infusion (has no administration in time range)  acetaminophen (TYLENOL) tablet 650 mg (has no administration in time range)    Or  acetaminophen (TYLENOL) suppository 650 mg (has no administration in time range)  morphine 2 MG/ML injection 2 mg (has no administration in time range)  ondansetron (ZOFRAN) tablet 4 mg (has no administration in time range)    Or  ondansetron (ZOFRAN) injection 4 mg (has no administration in time range)  hydrALAZINE (APRESOLINE) injection 5 mg (has no administration in time range)  sodium chloride flush (NS) 0.9 % injection 3 mL (has no administration in time range)  HYDROmorphone (DILAUDID) injection 0.25-0.5 mg (0.5 mg Intravenous Given 03/03/20 1340)  meperidine (DEMEROL) injection 6.25-12.5 mg (has no administration in time range)  midazolam (VERSED) injection 0.5-2 mg (has no administration in time range)  promethazine (PHENERGAN) injection 6.25-12.5 mg (has no administration in time range)  oxyCODONE (Oxy IR/ROXICODONE) 5 MG immediate release tablet (has no administration in time range)  HYDROmorphone (DILAUDID) 1 MG/ML injection (has no administration in time range)  pantoprazole (PROTONIX) 80 mg in sodium chloride 0.9 % 100 mL IVPB (0 mg Intravenous Stopped 03/03/20 1142)  oxyCODONE (Oxy IR/ROXICODONE) immediate release tablet 5 mg (5 mg Oral Given 03/03/20 1342)    Or  oxyCODONE (ROXICODONE) 5 MG/5ML solution 5 mg ( Oral See Alternative 03/03/20 1342)    ED Course  I have reviewed the triage vital signs and the nursing notes.  Pertinent labs & imaging results that were available during my care of the patient were reviewed by me and considered in my medical decision making (see chart for details).  Patient here with bright red blood PR this  morning.  Has history of the same has had severe bleeding at recent hospitalization Vital signs reassuringly within normal limits today.  Patient is mentating well.  Guaiac positive on exam.  He does not have any abdominal tenderness he does have complaints of hip pain which is appears to be somewhat chronic for him after fracture several months ago.  Concern for this being recurrent GI bleed.  Patient has already called Dr. Almyra Free of gastroenterology who will take patient to endoscopy suite for EGD.  Covid test negative, Dr. Benson Norway of gastroenterology made aware.  CMP with no significant abnormalities.  CBC without leukocytosis.  Anemia is actually improved from prior/discharge H&H which was 8.3 it is today 9.9.  Type and screen obtained.  Clinical Course as of 03/03/20 1606  Sun Mar 03, 2020  1102 Discussed with Dr. Benson Norway who recommends hospitalist admission.  Will scope at 1pm. I will message Dr. Benson Norway once COVID results.  Protonix started. No indication for rocephin.  [WF]    Clinical Course User Index [WF] Tedd Sias, PA   MDM Rules/Calculators/A&P                          Patient discussed with my attending physician prior to admission. Patient admitted to the hospitalist service by Dr. Lorin Mercy. Started on PPI.  Final Clinical Impression(s) / ED Diagnoses Final diagnoses:  Acute gastric ulcer with hemorrhage    Rx / DC Orders ED Discharge Orders    None       Chadrick Sprinkle,  [name], PA 03/03/20 [place], Ankit, MD 03/05/20 1015

## 2020-03-03 NOTE — Anesthesia Procedure Notes (Signed)
Procedure Name: MAC Date/Time: 03/03/2020 12:55 PM Performed by: Rande Brunt, CRNA Pre-anesthesia Checklist: Patient identified, Emergency Drugs available, Suction available, Patient being monitored and Timeout performed Patient Re-evaluated:Patient Re-evaluated prior to induction Oxygen Delivery Method: Simple face mask Preoxygenation: Pre-oxygenation with 100% oxygen Induction Type: IV induction Placement Confirmation: positive ETCO2 Dental Injury: Teeth and Oropharynx as per pre-operative assessment

## 2020-03-03 NOTE — ED Notes (Signed)
Dr. Yates at bedside.  

## 2020-03-04 ENCOUNTER — Ambulatory Visit: Payer: Medicare Other | Admitting: Physician Assistant

## 2020-03-04 ENCOUNTER — Encounter (HOSPITAL_COMMUNITY): Payer: Self-pay | Admitting: Gastroenterology

## 2020-03-04 DIAGNOSIS — K250 Acute gastric ulcer with hemorrhage: Secondary | ICD-10-CM

## 2020-03-04 DIAGNOSIS — K921 Melena: Secondary | ICD-10-CM

## 2020-03-04 DIAGNOSIS — K289 Gastrojejunal ulcer, unspecified as acute or chronic, without hemorrhage or perforation: Secondary | ICD-10-CM

## 2020-03-04 DIAGNOSIS — D62 Acute posthemorrhagic anemia: Secondary | ICD-10-CM

## 2020-03-04 LAB — CBC
HCT: 28.6 % — ABNORMAL LOW (ref 39.0–52.0)
HCT: 30.8 % — ABNORMAL LOW (ref 39.0–52.0)
Hemoglobin: 8.9 g/dL — ABNORMAL LOW (ref 13.0–17.0)
Hemoglobin: 9.5 g/dL — ABNORMAL LOW (ref 13.0–17.0)
MCH: 30.9 pg (ref 26.0–34.0)
MCH: 30.5 pg (ref 26.0–34.0)
MCHC: 31.1 g/dL (ref 30.0–36.0)
MCHC: 30.8 g/dL (ref 30.0–36.0)
MCV: 99.3 fL (ref 80.0–100.0)
MCV: 99 fL (ref 80.0–100.0)
Platelets: 174 10*3/uL (ref 150–400)
Platelets: 208 10*3/uL (ref 150–400)
RBC: 2.88 MIL/uL — ABNORMAL LOW (ref 4.22–5.81)
RBC: 3.11 MIL/uL — ABNORMAL LOW (ref 4.22–5.81)
RDW: 15.1 % (ref 11.5–15.5)
RDW: 15.4 % (ref 11.5–15.5)
WBC: 3.9 10*3/uL — ABNORMAL LOW (ref 4.0–10.5)
WBC: 7.6 10*3/uL (ref 4.0–10.5)
nRBC: 0 % (ref 0.0–0.2)
nRBC: 0 % (ref 0.0–0.2)

## 2020-03-04 LAB — BASIC METABOLIC PANEL
Anion gap: 8 (ref 5–15)
BUN: 7 mg/dL — ABNORMAL LOW (ref 8–23)
CO2: 25 mmol/L (ref 22–32)
Calcium: 8 mg/dL — ABNORMAL LOW (ref 8.9–10.3)
Chloride: 108 mmol/L (ref 98–111)
Creatinine, Ser: 0.81 mg/dL (ref 0.61–1.24)
GFR, Estimated: >60 mL/min (ref >60)
Glucose, Bld: 143 mg/dL — ABNORMAL HIGH (ref 70–99)
Potassium: 3.9 mmol/L (ref 3.5–5.1)
Sodium: 141 mmol/L (ref 135–145)

## 2020-03-04 LAB — TSH: TSH: 0.234 u[IU]/mL — ABNORMAL LOW (ref 0.350–4.500)

## 2020-03-04 MED ORDER — BISACODYL 5 MG PO TBEC
20.0000 mg | DELAYED_RELEASE_TABLET | Freq: Once | ORAL | Status: AC
  Administered 2020-03-04: 20 mg via ORAL
  Filled 2020-03-04: qty 4

## 2020-03-04 MED ORDER — METOCLOPRAMIDE HCL 5 MG/ML IJ SOLN
10.0000 mg | Freq: Once | INTRAMUSCULAR | Status: AC
  Administered 2020-03-04: 10 mg via INTRAVENOUS
  Filled 2020-03-04: qty 2

## 2020-03-04 MED ORDER — PEG-KCL-NACL-NASULF-NA ASC-C 100 G PO SOLR
0.5000 | Freq: Once | ORAL | Status: AC
  Administered 2020-03-04: 100 g via ORAL
  Filled 2020-03-04: qty 1

## 2020-03-04 MED ORDER — PANTOPRAZOLE SODIUM 40 MG PO TBEC
40.0000 mg | DELAYED_RELEASE_TABLET | Freq: Two times a day (BID) | ORAL | Status: DC
  Administered 2020-03-04 – 2020-03-06 (×5): 40 mg via ORAL
  Filled 2020-03-04 (×5): qty 1

## 2020-03-04 MED ORDER — PEG-KCL-NACL-NASULF-NA ASC-C 100 G PO SOLR: 1.0000 | Freq: Once | ORAL | Status: DC

## 2020-03-04 MED ORDER — PREDNISONE 5 MG PO TABS
5.0000 mg | ORAL_TABLET | Freq: Every day | ORAL | Status: DC
  Administered 2020-03-05 – 2020-03-06 (×2): 5 mg via ORAL
  Filled 2020-03-04 (×2): qty 1

## 2020-03-04 NOTE — H&P (View-Only) (Signed)
Daily Rounding Note  03/04/2020, 12:13 PM  LOS: 1 day   SUBJECTIVE:   Chief complaint:  Hematochezia.     Feels well.  Last bloody stool was yeasterday, PTA.    OBJECTIVE:         Vital signs in last 24 hours:    Temp:  [97 F (36.1 C)-98.3 F (36.8 C)] 98.3 F (36.8 C) (01/31 1048) Pulse Rate:  [56-63] 63 (01/31 1048) Resp:  [9-20] 14 (01/31 1048) BP: (93-137)/(58-109) 109/59 (01/31 1048) SpO2:  [94 %-100 %] 98 % (01/31 1048) Weight:  [88.7 kg] 88.7 kg (01/30 1714) Last BM Date: 03/04/19 Filed Weights   03/03/20 0933 03/03/20 1714  Weight: 86.2 kg 88.7 kg   General: looks well   Heart: RRR Chest: clear bil.   Abdomen: soft, NT, ND.  Active BS  Extremities: no CCE Neuro/Psych:  Alert, oriented x 3.  No tremors or gross deficits.    Intake/Output from previous day: 01/30 0701 - 01/31 0700 In: 200 [I.V.:100; IV Piggyback:100] Out: 900 [Urine:900]  Intake/Output this shift: Total I/O In: 440 [P.O.:440] Out: 450 [Urine:450]  Lab Results: Recent Labs    03/03/20 0949 03/03/20 1528 03/04/20 0432  WBC 6.6 6.9 3.9*  HGB 9.9* 9.1* 8.9*  HCT 32.2* 28.5* 28.6*  PLT 196 184 174   BMET Recent Labs    03/03/20 0949 03/04/20 0432  NA 139 141  K 4.0 3.9  CL 106 108  CO2 27 25  GLUCOSE 97 143*  BUN 10 7*  CREATININE 0.94 0.81  CALCIUM 8.3* 8.0*   LFT Recent Labs    03/03/20 0949  PROT 4.9*  ALBUMIN 2.6*  AST 18  ALT 14  ALKPHOS 88  BILITOT 0.6   PT/INR No results for input(s): LABPROT, INR in the last 72 hours. Hepatitis Panel No results for input(s): HEPBSAG, HCVAB, HEPAIGM, HEPBIGM in the last 72 hours.  Studies/Results: No results found.  \ Scheduled Meds: . escitalopram  10 mg Oral QHS  . hydrocortisone sod succinate (SOLU-CORTEF) inj  50 mg Intravenous Q6H  . levothyroxine  88 mcg Oral QAC breakfast  . pantoprazole  40 mg Oral BID  . sodium chloride flush  3 mL Intravenous Q12H   . sucralfate  1 g Oral TID WC & HS  . tamsulosin  0.4 mg Oral QPC supper   Continuous Infusions: . lactated ringers 100 mL/hr at 03/03/20 1736   PRN Meds:.acetaminophen **OR** acetaminophen, clonazePAM, hydrALAZINE, morphine injection, ondansetron **OR** ondansetron (ZOFRAN) IV, oxyCODONE  ASSESMENT:   *   GI bleed with hematochezia, melena. Recurrent.  This is the third recurrence within less than 4 weeks.  Bleeding source determined to be anastomotic ulcer at [place] anastomosis by previous EGD. EGDs 1/12, 1/24 and 1/30 03/03/2020 EGD: Nonbleeding, superficial gastric ulcer at gastrojejunal anastomosis.  Healing anastomotic (marginal), though this area was friable at the site of surgical staples.  No evidence of bleeding in the examined jejunum.  Unremarkable colonoscopy 2014 in [place]. Has not had any advanced GI imaging, i.e. CT, MRI. Protonix 40 mg po bid, Carafate suspension WC/HS continue, same as outpt regimen.    *   Hx malignant melanoma.   Craniotomy for mets to brain in 01/2016 in [place]. No intraabdominal or chest mets on CT and MR brain (stable nodular enhancement L temporal lobe) 09/2019.    *   01/2020 left pubic rami fracture.  Still has pain w walker assisted ambulation  *  IPMN, stable.  Followed at [facility].  CTAP w contrast 09/2019: Stable cystic lesion at head of pancreas, and stable low-attenuation pancreatic lesions, c/w IPMNs.  No evidence of metastatic disease within the chest, abdomen or pelvis.  *    Chronic prednisone low-dose, 5 mg/day  PLAN   *    Normocytic anemia. Hgb stable 9.2 >> 8.9 over previous 7 days.  *   Does he need capsule study or colonoscopy?  Repeat CTAP?      [name]  03/04/2020, 12:13 PM Phone [phone]

## 2020-03-04 NOTE — Progress Notes (Signed)
PROGRESS NOTE    Maxwell Fox  CXF:[id number] DOB: 1945-11-29 DOA: 03/03/2020 PCP: Pincus Sanes, MD    Brief Narrative:  75 year old gentleman with history of Roux-en-Y in 2000, chronic adrenal insufficiency on prednisone 5 mg daily at home, anxiety depression, hypothyroidism, metastatic melanoma status post resection of brain mets and immunotherapy, recent pelvic fracture with recent multiple admissions with upper GI bleeding presented to the ER with one episode of hematochezia. EGD 1/12, 1/24, anastomotic bleeding.  2 units PRBC transfusion needed for hemorrhagic shock in ICU. EGD 1/30, nonbleeding superficial gastric ulcer at GJ anastomosis.    Assessment & Plan:   Principal Problem:   Acute gastric ulcer with hemorrhage Active Problems:   Benign prostatic hyperplasia   Melanoma metastatic to brain [facility])   Adrenal insufficiency due to cancer therapy (HCC)   Secondary hypothyroidism  Recurrent GI bleeding, suspected upper GI bleeding from anastomotic ulcer: Currently stabilized. Hemoglobin remains a stable today, will continue to monitor twice a day and transfuse if less than 8.  Currently no indication for transfusion. Underwent upper GI endoscopy, may benefit with capsule endoscopy and colonoscopy.  GI following. Continue Protonix 40 mg twice a day, continue sucralfate.  He is normal using NSAIDs.  Supportive treatment today.  Metastatic melanoma: In remission as per patient.  Surveillance at [facility].  Chronic adrenal insufficiency: On prednisone 5 mg at home.  Was given high-dose hydrocortisone in the hospital, stable.  Resume his home doses.  BPH: Stable.  On Flomax.  Hypothyroidism: On Synthroid.  TSH 0.234.  Not useful during acute illness.  Continue same doses of Synthroid.  Recheck after stabilization.  Left superior inferior pubic rami fracture: Continue to work with PT OT.  Avoiding NSAIDs.  Occasional oxycodone.  Depression/anxiety: On Lexapro and Klonopin.   Continue.   DVT prophylaxis: SCDs Start: 03/03/20 1232   Code Status: Full code Family Communication: Daughter at bedside Disposition Plan: Status is: Inpatient  Remains inpatient appropriate because:Inpatient level of care appropriate due to severity of illness   Dispo: The patient is from: Home              Anticipated d/c is to: Home              Anticipated d/c date is: 2 days              Patient currently is not medically stable to d/c.   Difficult to place patient No         Consultants:   Gastroenterology  Procedures:   EGD 1/30  Antimicrobials:   None   Subjective: Patient seen and examined.  No overnight events.  Daughter was at the bedside.  Denies any nausea vomiting.  Just anxious with the events and frustrated with need to come to the hospital. No more bowel movement since arrival.  Denies constipation.  Objective: Vitals:   03/03/20 1714 03/03/20 2139 03/04/20 0459 03/04/20 1048  BP: (!) 137/109 125/65 123/69 (!) 109/59  Pulse: (!) 56 60 (!) 58 63  Resp: 18  20 14   Temp: 97.9 F (36.6 C) 97.8 F (36.6 C) 98 F (36.7 C) 98.3 F (36.8 C)  TempSrc: Oral Oral Oral   SpO2: 100% 97% 97% 98%  Weight: 88.7 kg     Height: 5\' 11"  (1.803 m)       Intake/Output Summary (Last 24 hours) at 03/04/2020 1529 Last data filed at 03/04/2020 1423 Gross per 24 hour  Intake 2451.97 ml  Output 1125 ml  Net 1326.97  ml   Filed Weights   03/03/20 0933 03/03/20 1714  Weight: 86.2 kg 88.7 kg    Examination:  General exam: Appears calm and comfortable  Respiratory system: Clear to auscultation. Respiratory effort normal. Cardiovascular system: S1 & S2 heard, RRR. No JVD, murmurs, rubs, gallops or clicks.  1+ bilateral pedal edema.   Gastrointestinal system: Abdomen is nondistended, soft and nontender. No organomegaly or masses felt. Normal bowel sounds heard. Central nervous system: Alert and oriented. No focal neurological deficits. Extremities: Symmetric  5 x 5 power. Skin: No rashes, lesions or ulcers Psychiatry: Judgement and insight appear normal. Mood & affect appropriate.     Data Reviewed: I have personally reviewed following labs and imaging studies  CBC: Recent Labs  Lab 02/27/20 0414 02/27/20 1654 02/28/20 1642 02/29/20 0427 03/03/20 0949 03/03/20 1528 03/04/20 0432  WBC 7.5  --   --   --  6.6 6.9 3.9*  HGB 8.9*   < > 9.2* 8.3* 9.9* 9.1* 8.9*  HCT 26.0*   < > 28.9* 25.0* 32.2* 28.5* 28.6*  MCV 93.2  --   --   --  101.6* 99.7 99.3  PLT 134*  --   --   --  196 184 174   < > = values in this interval not displayed.   Basic Metabolic Panel: Recent Labs  Lab 02/27/20 0125 03/03/20 0949 03/04/20 0432  NA 137 139 141  K 3.9 4.0 3.9  CL 107 106 108  CO2 22 27 25   GLUCOSE 150* 97 143*  BUN 14 10 7*  CREATININE 0.90 0.94 0.81  CALCIUM 7.8* 8.3* 8.0*   GFR: Estimated Creatinine Clearance: 85.2 mL/min (by C-G formula based on SCr of 0.81 mg/dL). Liver Function Tests: Recent Labs  Lab 03/03/20 0949  AST 18  ALT 14  ALKPHOS 88  BILITOT 0.6  PROT 4.9*  ALBUMIN 2.6*   No results for input(s): LIPASE, AMYLASE in the last 168 hours. No results for input(s): AMMONIA in the last 168 hours. Coagulation Profile: No results for input(s): INR, PROTIME in the last 168 hours. Cardiac Enzymes: No results for input(s): CKTOTAL, CKMB, CKMBINDEX, TROPONINI in the last 168 hours. BNP (last 3 results) No results for input(s): PROBNP in the last 8760 hours. HbA1C: No results for input(s): HGBA1C in the last 72 hours. CBG: No results for input(s): GLUCAP in the last 168 hours. Lipid Profile: No results for input(s): CHOL, HDL, LDLCALC, TRIG, CHOLHDL, LDLDIRECT in the last 72 hours. Thyroid Function Tests: Recent Labs    03/04/20 0432  TSH 0.234*   Anemia Panel: No results for input(s): VITAMINB12, FOLATE, FERRITIN, TIBC, IRON, RETICCTPCT in the last 72 hours. Sepsis Labs: No results for input(s): PROCALCITON,  LATICACIDVEN in the last 168 hours.  Recent Results (from the past 240 hour(s))  SARS Coronavirus 2 by RT PCR (hospital order, performed in [facility] hospital lab) Nasopharyngeal Nasopharyngeal Swab     Status: None   Collection Time: 02/26/20  1:45 AM   Specimen: Nasopharyngeal Swab  Result Value Ref Range Status   SARS Coronavirus 2 NEGATIVE NEGATIVE Final    Comment: (NOTE) SARS-CoV-2 target nucleic acids are NOT DETECTED.  The SARS-CoV-2 RNA is generally detectable in upper and lower respiratory specimens during the acute phase of infection. The lowest concentration of SARS-CoV-2 viral copies this assay can detect is 250 copies / mL. A negative result does not preclude SARS-CoV-2 infection and should not be used as the sole basis for treatment or other patient  management decisions.  A negative result may occur with improper specimen collection / handling, submission of specimen other than nasopharyngeal swab, presence of viral mutation(s) within the areas targeted by this assay, and inadequate number of viral copies (<250 copies / mL). A negative result must be combined with clinical observations, patient history, and epidemiological information.  Fact Sheet for Patients:   StrictlyIdeas.no  Fact Sheet for Healthcare Providers: BankingDealers.co.za  This test is not yet approved or  cleared by the [place] FDA and has been authorized for detection and/or diagnosis of SARS-CoV-2 by FDA under an Emergency Use Authorization (EUA).  This EUA will remain in effect (meaning this test can be used) for the duration of the COVID-19 declaration under Section 564(b)(1) of the Act, 21 U.S.C. section 360bbb-3(b)(1), unless the authorization is terminated or revoked sooner.  Performed at [facility], [place] [address], [place], [place] [address]   MRSA PCR Screening     Status: None   Collection Time: 02/26/20  7:34 AM    Specimen: Nasopharyngeal  Result Value Ref Range Status   MRSA by PCR NEGATIVE NEGATIVE Final    Comment:        The GeneXpert MRSA Assay (FDA approved for NASAL specimens only), is one component of a comprehensive MRSA colonization surveillance program. It is not intended to diagnose MRSA infection nor to guide or monitor treatment for MRSA infections. Performed at [facility], [place] [address], [place], [place] [address]   SARS Coronavirus 2 by RT PCR (hospital order, performed in [facility] hospital lab) Nasopharyngeal Nasopharyngeal Swab     Status: None   Collection Time: 03/03/20  9:51 AM   Specimen: Nasopharyngeal Swab  Result Value Ref Range Status   SARS Coronavirus 2 NEGATIVE NEGATIVE Final    Comment: (NOTE) SARS-CoV-2 target nucleic acids are NOT DETECTED.  The SARS-CoV-2 RNA is generally detectable in upper and lower respiratory specimens during the acute phase of infection. The lowest concentration of SARS-CoV-2 viral copies this assay can detect is 250 copies / mL. A negative result does not preclude SARS-CoV-2 infection and should not be used as the sole basis for treatment or other patient management decisions.  A negative result may occur with improper specimen collection / handling, submission of specimen other than nasopharyngeal swab, presence of viral mutation(s) within the areas targeted by this assay, and inadequate number of viral copies (<250 copies / mL). A negative result must be combined with clinical observations, patient history, and epidemiological information.  Fact Sheet for Patients:   StrictlyIdeas.no  Fact Sheet for Healthcare Providers: BankingDealers.co.za  This test is not yet approved or  cleared by the [place] FDA and has been authorized for detection and/or diagnosis of SARS-CoV-2 by FDA under an Emergency Use Authorization (EUA).  This EUA will remain in effect (meaning  this test can be used) for the duration of the COVID-19 declaration under Section 564(b)(1) of the Act, 21 U.S.C. section 360bbb-3(b)(1), unless the authorization is terminated or revoked sooner.  Performed at [facility], [place] [address] [place], [place] [address]          Radiology Studies: No results found.      Scheduled Meds: . escitalopram  10 mg Oral QHS  . levothyroxine  88 mcg Oral QAC breakfast  . pantoprazole  40 mg Oral BID  . [START ON 03/05/2020] predniSONE  5 mg Oral Q breakfast  . sodium chloride flush  3 mL Intravenous Q12H  .  sucralfate  1 g Oral TID WC & HS  . tamsulosin  0.4 mg Oral QPC supper   Continuous Infusions: . lactated ringers 100 mL/hr at 03/04/20 1423     LOS: 1 day    Time spent: 28 minutes    Barb Merino, MD [facility] Pager [phone]

## 2020-03-04 NOTE — Progress Notes (Signed)
   03/04/20 1030  Clinical Encounter Type  Visited With Patient  Visit Type Initial  Referral From Nurse  Consult/Referral To [place] responded to the consult for Mr. Zackery.  He requested paperwork for AD.  Hid daughter had just stepped out but he stated she will return in about an hour.  Informed Mr. Giarratano a notary will not be available until Friday but if he has to have the AD notarized before Friday he can have it done outside of the hospital and have his daughter return the AD to the hospital and we can have it placed in his chart and uploaded in his file.  Chaplain [place] Winslow Morgan-Simpson [phone]

## 2020-03-04 NOTE — Progress Notes (Signed)
        Daily Rounding Note  03/04/2020, 12:13 PM  LOS: 1 day   SUBJECTIVE:   Chief complaint:  Hematochezia.     Feels well.  Last bloody stool was yeasterday, PTA.    OBJECTIVE:         Vital signs in last 24 hours:    Temp:  [97 F (36.1 C)-98.3 F (36.8 C)] 98.3 F (36.8 C) (01/31 1048) Pulse Rate:  [56-63] 63 (01/31 1048) Resp:  [9-20] 14 (01/31 1048) BP: (93-137)/(58-109) 109/59 (01/31 1048) SpO2:  [94 %-100 %] 98 % (01/31 1048) Weight:  [88.7 kg] 88.7 kg (01/30 1714) Last BM Date: 03/04/19 Filed Weights   03/03/20 0933 03/03/20 1714  Weight: 86.2 kg 88.7 kg   General: looks well   Heart: RRR Chest: clear bil.   Abdomen: soft, NT, ND.  Active BS  Extremities: no CCE Neuro/Psych:  Alert, oriented x 3.  No tremors or gross deficits.    Intake/Output from previous day: 01/30 0701 - 01/31 0700 In: 200 [I.V.:100; IV Piggyback:100] Out: 900 [Urine:900]  Intake/Output this shift: Total I/O In: 440 [P.O.:440] Out: 450 [Urine:450]  Lab Results: Recent Labs    03/03/20 0949 03/03/20 1528 03/04/20 0432  WBC 6.6 6.9 3.9*  HGB 9.9* 9.1* 8.9*  HCT 32.2* 28.5* 28.6*  PLT 196 184 174   BMET Recent Labs    03/03/20 0949 03/04/20 0432  NA 139 141  K 4.0 3.9  CL 106 108  CO2 27 25  GLUCOSE 97 143*  BUN 10 7*  CREATININE 0.94 0.81  CALCIUM 8.3* 8.0*   LFT Recent Labs    03/03/20 0949  PROT 4.9*  ALBUMIN 2.6*  AST 18  ALT 14  ALKPHOS 88  BILITOT 0.6   PT/INR No results for input(s): LABPROT, INR in the last 72 hours. Hepatitis Panel No results for input(s): HEPBSAG, HCVAB, HEPAIGM, HEPBIGM in the last 72 hours.  Studies/Results: No results found.  \ Scheduled Meds: . escitalopram  10 mg Oral QHS  . hydrocortisone sod succinate (SOLU-CORTEF) inj  50 mg Intravenous Q6H  . levothyroxine  88 mcg Oral QAC breakfast  . pantoprazole  40 mg Oral BID  . sodium chloride flush  3 mL Intravenous Q12H   . sucralfate  1 g Oral TID WC & HS  . tamsulosin  0.4 mg Oral QPC supper   Continuous Infusions: . lactated ringers 100 mL/hr at 03/03/20 1736   PRN Meds:.acetaminophen **OR** acetaminophen, clonazePAM, hydrALAZINE, morphine injection, ondansetron **OR** ondansetron (ZOFRAN) IV, oxyCODONE  ASSESMENT:   *   GI bleed with hematochezia, melena. Recurrent.  This is the third recurrence within less than 4 weeks.  Bleeding source determined to be anastomotic ulcer at GJ anastomosis by previous EGD. EGDs 1/12, 1/24 and 1/30 03/03/2020 EGD: Nonbleeding, superficial gastric ulcer at gastrojejunal anastomosis.  Healing anastomotic (marginal), though this area was friable at the site of surgical staples.  No evidence of bleeding in the examined jejunum.  Unremarkable colonoscopy 2014 in [place]. Has not had any advanced GI imaging, i.e. CT, MRI. Protonix 40 mg po bid, Carafate suspension WC/HS continue, same as outpt regimen.    *   Hx malignant melanoma.   Craniotomy for mets to brain in 01/2016 in [place]. No intraabdominal or chest mets on CT and MR brain (stable nodular enhancement L temporal lobe) 09/2019.    *   01/2020 left pubic rami fracture.  Still has pain w walker assisted ambulation  *      IPMN, stable.  Followed at [facility].  CTAP w contrast 09/2019: Stable cystic lesion at head of pancreas, and stable low-attenuation pancreatic lesions, c/w IPMNs.  No evidence of metastatic disease within the chest, abdomen or pelvis.  *    Chronic prednisone low-dose, 5 mg/day  PLAN   *    Normocytic anemia. Hgb stable 9.2 >> 8.9 over previous 7 days.  *   Does he need capsule study or colonoscopy?  Repeat CTAP?      Maxwell Fox  03/04/2020, 12:13 PM Phone [phone]

## 2020-03-05 ENCOUNTER — Encounter (HOSPITAL_COMMUNITY): Payer: Self-pay | Admitting: Internal Medicine

## 2020-03-05 ENCOUNTER — Encounter (HOSPITAL_COMMUNITY)
Admission: EM | Disposition: A | Discharge status: Home Health | Payer: Self-pay | Source: Home / Self Care | Attending: Internal Medicine

## 2020-03-05 HISTORY — PX: COLONOSCOPY WITH PROPOFOL: SHX5780

## 2020-03-05 LAB — CBC
HCT: 26.7 % — ABNORMAL LOW (ref 39.0–52.0)
Hemoglobin: 8.3 g/dL — ABNORMAL LOW (ref 13.0–17.0)
MCH: 30.9 pg (ref 26.0–34.0)
MCHC: 31.1 g/dL (ref 30.0–36.0)
MCV: 99.3 fL (ref 80.0–100.0)
Platelets: 172 10*3/uL (ref 150–400)
RBC: 2.69 MIL/uL — ABNORMAL LOW (ref 4.22–5.81)
RDW: 15.3 % (ref 11.5–15.5)
WBC: 6.4 10*3/uL (ref 4.0–10.5)
nRBC: 0 % (ref 0.0–0.2)

## 2020-03-05 SURGERY — COLONOSCOPY WITH PROPOFOL
Anesthesia: Moderate Sedation

## 2020-03-05 MED ORDER — DIPHENHYDRAMINE HCL 50 MG/ML IJ SOLN
INTRAMUSCULAR | Status: AC
  Filled 2020-03-05: qty 1

## 2020-03-05 MED ORDER — FENTANYL CITRATE (PF) 100 MCG/2ML IJ SOLN
INTRAMUSCULAR | Status: AC
  Filled 2020-03-05: qty 4

## 2020-03-05 MED ORDER — MIDAZOLAM HCL (PF) 10 MG/2ML IJ SOLN
INTRAMUSCULAR | Status: DC | PRN
  Administered 2020-03-05 (×3): 1 mg via INTRAVENOUS
  Administered 2020-03-05: 2 mg via INTRAVENOUS
  Administered 2020-03-05 (×2): 1 mg via INTRAVENOUS

## 2020-03-05 MED ORDER — MIDAZOLAM HCL (PF) 5 MG/ML IJ SOLN
INTRAMUSCULAR | Status: AC
  Filled 2020-03-05: qty 2

## 2020-03-05 MED ORDER — FENTANYL CITRATE (PF) 100 MCG/2ML IJ SOLN
INTRAMUSCULAR | Status: DC | PRN
  Administered 2020-03-05 (×2): 25 ug via INTRAVENOUS
  Administered 2020-03-05: 50 ug via INTRAVENOUS
  Administered 2020-03-05 (×3): 25 ug via INTRAVENOUS

## 2020-03-05 MED ORDER — DIPHENHYDRAMINE HCL 50 MG/ML IJ SOLN
INTRAMUSCULAR | Status: DC | PRN
  Administered 2020-03-05 (×2): 25 mg via INTRAVENOUS

## 2020-03-05 SURGICAL SUPPLY — 22 items

## 2020-03-05 NOTE — Op Note (Addendum)
[facility] Patient Name: Maxwell Fox Procedure Date : 03/05/2020 MRN: [id number] Attending MD: Justice Britain , MD Date of Birth: Apr 09, 1945 CSN: [id number] Age: 75 Admit Type: Inpatient Procedure:                Colonoscopy Indications:              Hematochezia Providers:                Justice Britain, MD, Josie Dixon, RN,                            Tyrone Apple, Technician Referring MD:             [facility], [name]. Pyrtle, MD Medicines:                Fentanyl 175 micrograms IV, Midazolam 7 mg IV,                            Diphenhydramine 50 mg IV Complications:            No immediate complications. Estimated Blood Loss:     Estimated blood loss: none. Procedure:                Pre-Anesthesia Assessment:                           - Prior to the procedure, a History and Physical                            was performed, and patient medications and                            allergies were reviewed. The patient's tolerance of                            previous anesthesia was also reviewed. The risks                            and benefits of the procedure and the sedation                            options and risks were discussed with the patient.                            All questions were answered, and informed consent                            was obtained. Prior Anticoagulants: The patient has                            taken no previous anticoagulant or antiplatelet                            agents. ASA Grade Assessment: III - A patient with  severe systemic disease. After reviewing the risks                            and benefits, the patient was deemed in                            satisfactory condition to undergo the procedure.                           After obtaining informed consent, the colonoscope                            was passed under direct vision. Throughout the                             procedure, the patient's blood pressure, pulse, and                            oxygen saturations were monitored continuously. The                            [id number]-HQ190L ([id number] Olympus colonoscope was                            introduced through the anus and advanced to the the                            cecum, identified by appendiceal orifice and                            ileocecal valve. The colonoscopy was performed                            without difficulty. The patient tolerated the                            procedure. The quality of the bowel preparation was                            adequate. Scope In: 4:11:11 PM Scope Out: 4:30:34 PM Scope Withdrawal Time: 0 hours 12 minutes 37 seconds  Total Procedure Duration: 0 hours 19 minutes 23 seconds  Findings:      The digital rectal exam findings include hemorrhoids. Pertinent       negatives include no palpable rectal lesions.      Normal mucosa was found in the entire colon.      Multiple small and large-mouthed diverticula were found in the       recto-sigmoid colon and sigmoid colon.      Non-bleeding non-thrombosed internal hemorrhoids were found during       retroflexion, during perianal exam and during digital exam. The       hemorrhoids were Grade II (internal hemorrhoids that prolapse but reduce       spontaneously). Impression:               - Hemorrhoids found on digital  rectal exam.                           - Normal mucosa in the entire examined colon.                           - Diverticulosis in the recto-sigmoid colon and in                            the sigmoid colon.                           - Non-bleeding non-thrombosed internal hemorrhoids. Moderate Sedation:      Moderate (conscious) sedation was administered by the endoscopy nurse       and supervised by the endoscopist. The following parameters were       monitored: oxygen saturation, heart rate, blood pressure, and response       to care. Total  physician intraservice time was 45 minutes. Recommendation:           - The patient will be observed post-procedure,                            until all discharge criteria are met.                           - Return patient to hospital ward for ongoing care.                           - Trend Hgb/Hct.                           - Initiate Anusol suppositories QHS x 1-week and                            then every other night for 2-weeks.                           - If patient has recurrent issues, then repeat SBE                            with VCE placement would be recommended for further                            evaluation. Based on hemodynamics, if coming in                            acutely overnight, and unstable then proceed with                            CT-Angiography vs Tagged RBC if hemodynamically                            stable.                           -  The findings and recommendations were discussed                            with the patient.                           - The findings and recommendations were discussed                            with the referring physician. Procedure Code(s):        --- Professional ---                           [phone], Colonoscopy, flexible; diagnostic, including                            collection of specimen(s) by brushing or washing,                            when performed (separate procedure) Diagnosis Code(s):        --- Professional ---                           K64.1, Second degree hemorrhoids                           K92.1, Melena (includes Hematochezia)                           K57.30, Diverticulosis of large intestine without                            perforation or abscess without bleeding CPT copyright 2019 [employer]. All rights reserved. The codes documented in this report are preliminary and upon coder review may  be revised to meet current compliance requirements. Justice Britain,  MD 03/05/2020 4:51:14 PM Number of Addenda: 0

## 2020-03-05 NOTE — Progress Notes (Signed)
PROGRESS NOTE    Maxwell Fox  SWF:[id number] DOB: May 31, 1945 DOA: 03/03/2020 PCP: [name], MD    Brief Narrative:  75 year old gentleman with history of Roux-en-Y in 2000, chronic adrenal insufficiency on prednisone 5 mg daily at home, anxiety depression, hypothyroidism, metastatic melanoma status post resection of brain mets and immunotherapy, recent pelvic fracture with recent multiple admissions with upper GI bleeding presented to the ER with one episode of hematochezia. EGD 1/12, 1/24, anastomotic bleeding.  2 units PRBC transfusion needed for hemorrhagic shock in ICU. EGD 1/30, nonbleeding superficial gastric ulcer at [place] anastomosis.    Assessment & Plan:   Principal Problem:   Acute gastric ulcer with hemorrhage Active Problems:   Benign prostatic hyperplasia   Melanoma metastatic to brain [facility])   Adrenal insufficiency due to cancer therapy ([place])   Secondary hypothyroidism  Recurrent GI bleeding, suspected upper GI bleeding from anastomotic ulcer: Currently stabilized. Hemoglobin remains stable today, will continue to monitor twice a day and transfuse if less than 8.  Currently no indication for transfusion. Underwent upper GI endoscopy, Schedule for colonoscopy and possible capsule endoscopy today.  Continue Protonix 40 mg twice a day, continue sucralfate.  He is normal using NSAIDs.  Supportive treatment today.  Metastatic melanoma: In remission as per patient.  Surve[facility] at [facility].  Chronic adrenal insufficiency: On prednisone 5 mg at home.  Was given high-dose hydrocortisone in the hospital, stable.  Resume his home doses.  BPH: Stable.  On Flomax.  Hypothyroidism: On Synthroid.  TSH 0.234.  Not useful during acute illness.  Continue same doses of Synthroid.  Recheck after stabilization.  Left superior inferior pubic rami fracture: Continue to work with PT OT.  Avoiding NSAIDs.  Occasional oxycodone.  Depression/anxiety: On Lexapro and Klonopin.   Continue.   DVT prophylaxis: SCDs Start: 03/03/20 1232   Code Status: Full code Family Communication: No family at bedside today. Disposition Plan: Status is: Inpatient  Remains inpatient appropriate because:Inpatient level of care appropriate due to severity of illness   Dispo: The patient is from: Home              Anticipated d/c is to: Home with home health PT.              Anticipated d/c date is: 2 days              Patient currently is not medically stable to d/c.   Difficult to place patient No         Consultants:   Gastroenterology  Procedures:   EGD 1/30  Antimicrobials:   None   Subjective: Patient seen and examined.  Overnight prepped for colonoscopy.  Multiple loose stools.  No bleeding seen.  Denies any complaints.  Objective: Vitals:   03/04/20 1913 03/04/20 2045 03/05/20 0430 03/05/20 1001  BP: (!) 107/59 118/68 119/61 122/62  Pulse: 67 (!) 56 60 62  Resp: 16 17 16 17   Temp: 97.8 F (36.6 C) 97.7 F (36.5 C) 97.6 F (36.4 C) 97.8 F (36.6 C)  TempSrc:    Oral  SpO2: 99% 100% 97% 100%  Weight:      Height:        Intake/Output Summary (Last 24 hours) at 03/05/2020 1315 Last data filed at 03/05/2020 0659 Gross per 24 hour  Intake 4399.69 ml  Output 125 ml  Net 4274.69 ml   Filed Weights   03/03/20 0933 03/03/20 1714  Weight: 86.2 kg 88.7 kg    Examination:  General exam:  Appears calm and comfortable  Respiratory system: Clear to auscultation. Respiratory effort normal. Cardiovascular system: S1 & S2 heard, RRR. No JVD, murmurs, rubs, gallops or clicks.  1+ bilateral pedal edema.   Gastrointestinal system: Abdomen is nondistended, soft and nontender. No organomegaly or masses felt. Normal bowel sounds heard. Central nervous system: Alert and oriented. No focal neurological deficits. Extremities: Symmetric 5 x 5 power. Skin: No rashes, lesions or ulcers Psychiatry: Judgement and insight appear normal. Mood & affect appropriate.      Data Reviewed: I have personally reviewed following labs and imaging studies  CBC: Recent Labs  Lab 03/03/20 0949 03/03/20 1528 03/04/20 0432 03/04/20 1715 03/05/20 0432  WBC 6.6 6.9 3.9* 7.6 6.4  HGB 9.9* 9.1* 8.9* 9.5* 8.3*  HCT 32.2* 28.5* 28.6* 30.8* 26.7*  MCV 101.6* 99.7 99.3 99.0 99.3  PLT 196 184 174 208 272   Basic Metabolic Panel: Recent Labs  Lab 03/03/20 0949 03/04/20 0432  NA 139 141  K 4.0 3.9  CL 106 108  CO2 27 25  GLUCOSE 97 143*  BUN 10 7*  CREATININE 0.94 0.81  CALCIUM 8.3* 8.0*   GFR: Estimated Creatinine Clearance: 85.2 mL/min (by C-G formula based on SCr of 0.81 mg/dL). Liver Function Tests: Recent Labs  Lab 03/03/20 0949  AST 18  ALT 14  ALKPHOS 88  BILITOT 0.6  PROT 4.9*  ALBUMIN 2.6*   No results for input(s): LIPASE, AMYLASE in the last 168 hours. No results for input(s): AMMONIA in the last 168 hours. Coagulation Profile: No results for input(s): INR, PROTIME in the last 168 hours. Cardiac Enzymes: No results for input(s): CKTOTAL, CKMB, CKMBINDEX, TROPONINI in the last 168 hours. BNP (last 3 results) No results for input(s): PROBNP in the last 8760 hours. HbA1C: No results for input(s): HGBA1C in the last 72 hours. CBG: No results for input(s): GLUCAP in the last 168 hours. Lipid Profile: No results for input(s): CHOL, HDL, LDLCALC, TRIG, CHOLHDL, LDLDIRECT in the last 72 hours. Thyroid Function Tests: Recent Labs    03/04/20 0432  TSH 0.234*   Anemia Panel: No results for input(s): VITAMINB12, FOLATE, FERRITIN, TIBC, IRON, RETICCTPCT in the last 72 hours. Sepsis Labs: No results for input(s): PROCALCITON, LATICACIDVEN in the last 168 hours.  Recent Results (from the past 240 hour(s))  SARS Coronavirus 2 by RT PCR (hospital order, performed in [facility] hospital lab) Nasopharyngeal Nasopharyngeal Swab     Status: None   Collection Time: 02/26/20  1:45 AM   Specimen: Nasopharyngeal Swab  Result Value Ref  Range Status   SARS Coronavirus 2 NEGATIVE NEGATIVE Final    Comment: (NOTE) SARS-CoV-2 target nucleic acids are NOT DETECTED.  The SARS-CoV-2 RNA is generally detectable in upper and lower respiratory specimens during the acute phase of infection. The lowest concentration of SARS-CoV-2 viral copies this assay can detect is 250 copies / mL. A negative result does not preclude SARS-CoV-2 infection and should not be used as the sole basis for treatment or other patient management decisions.  A negative result may occur with improper specimen collection / handling, submission of specimen other than nasopharyngeal swab, presence of viral mutation(s) within the areas targeted by this assay, and inadequate number of viral copies (<250 copies / mL). A negative result must be combined with clinical observations, patient history, and epidemiological information.  Fact Sheet for Patients:   StrictlyIdeas.no  Fact Sheet for Healthcare Providers: BankingDealers.co.za  This test is not yet approved or  cleared by the  [place] Architectural [employer]ologist and has been authorized for detection and/or diagnosis of SARS-CoV-2 by FDA under an Print production planner (EUA).  This EUA will remain in effect (meaning this test can be used) for the duration of the COVID-19 declaration under Section 564(b)(1) of the Act, 21 U.S.C. section 360bbb-3(b)(1), unless the authorization is terminated or revoked sooner.  Performed at [facility], [place] [address], [place], [place] [address]   MRSA PCR Screening     Status: None   Collection Time: 02/26/20  7:34 AM   Specimen: Nasopharyngeal  Result Value Ref Range Status   MRSA by PCR NEGATIVE NEGATIVE Final    Comment:        The GeneXpert MRSA Assay (FDA approved for NASAL specimens only), is one component of a comprehensive MRSA colonization surveillance program. It is not intended to diagnose MRSA infection nor to  guide or monitor treatment for MRSA infections. Performed at [facility], [place] [address] [place], [place] [address]   SARS Coronavirus 2 by RT PCR (hospital order, performed in [facility] hospital lab) Nasopharyngeal Nasopharyngeal Swab     Status: None   Collection Time: 03/03/20  9:51 AM   Specimen: Nasopharyngeal Swab  Result Value Ref Range Status   SARS Coronavirus 2 NEGATIVE NEGATIVE Final    Comment: (NOTE) SARS-CoV-2 target nucleic acids are NOT DETECTED.  The SARS-CoV-2 RNA is generally detectable in upper and lower respiratory specimens during the acute phase of infection. The lowest concentration of SARS-CoV-2 viral copies this assay can detect is 250 copies / mL. A negative result does not preclude SARS-CoV-2 infection and should not be used as the sole basis for treatment or other patient management decisions.  A negative result may occur with improper specimen collection / handling, submission of specimen other than nasopharyngeal swab, presence of viral mutation(s) within the areas targeted by this assay, and inadequate number of viral copies (<250 copies / mL). A negative result must be combined with clinical observations, patient history, and epidemiological information.  Fact Sheet for Patients:   StrictlyIdeas.no  Fact Sheet for Healthcare Providers: BankingDealers.co.za  This test is not yet approved or  cleared by the [place] FDA and has been authorized for detection and/or diagnosis of SARS-CoV-2 by FDA under an Emergency Use Authorization (EUA).  This EUA will remain in effect (meaning this test can be used) for the duration of the COVID-19 declaration under Section 564(b)(1) of the Act, 21 U.S.C. section 360bbb-3(b)(1), unless the authorization is terminated or revoked sooner.  Performed at [facility], [place] [address], [place], [place] [address]          Radiology Studies: No  results found.      Scheduled Meds: . escitalopram  10 mg Oral QHS  . levothyroxine  88 mcg Oral QAC breakfast  . pantoprazole  40 mg Oral BID  . predniSONE  5 mg Oral Q breakfast  . sodium chloride flush  3 mL Intravenous Q12H  . sucralfate  1 g Oral TID WC & HS  . tamsulosin  0.4 mg Oral QPC supper   Continuous Infusions: . lactated ringers 100 mL/hr at 03/05/20 1043     LOS: 2 days    Time spent: 25 minutes    Barb Merino, MD [facility] Pager [phone]

## 2020-03-05 NOTE — Interval H&P Note (Signed)
History and Physical Interval Note:  03/05/2020 3:28 PM  Maxwell Fox  has presented today for surgery, with the diagnosis of GI bleeding.  Anemia..  The various methods of treatment have been discussed with the patient and family. After consideration of risks, benefits and other options for treatment, the patient has consented to  Procedure(s): COLONOSCOPY WITH PROPOFOL (N/A) as a surgical intervention.  The patient's history has been reviewed, patient examined, no change in status, stable for surgery.  I have reviewed the patient's chart and labs.  Questions were answered to the patient's satisfaction.     [employer]

## 2020-03-06 DIAGNOSIS — K5790 Diverticulosis of intestine, part unspecified, without perforation or abscess without bleeding: Secondary | ICD-10-CM

## 2020-03-06 DIAGNOSIS — D649 Anemia, unspecified: Secondary | ICD-10-CM

## 2020-03-06 DIAGNOSIS — K649 Unspecified hemorrhoids: Secondary | ICD-10-CM

## 2020-03-06 MED ORDER — SODIUM CHLORIDE 0.9 % IV SOLN
510.0000 mg | Freq: Once | INTRAVENOUS | Status: AC
  Administered 2020-03-06: 510 mg via INTRAVENOUS
  Filled 2020-03-06: qty 17

## 2020-03-06 MED ORDER — HYDROCORTISONE ACETATE 25 MG RE SUPP: RECTAL | 0 refills | Status: DC

## 2020-03-06 MED ORDER — HYDROCORTISONE ACETATE 25 MG RE SUPP: 25.0000 mg | Freq: Every day | RECTAL | Status: DC

## 2020-03-06 NOTE — Progress Notes (Signed)
DISCHARGE NOTE HOME Maxwell Fox to be discharged Home per MD order. Discussed prescriptions and follow up appointments with the patient. Prescriptions given to patient; medication list explained in detail. Patient verbalized understanding.  Skin clean, dry and intact without evidence of skin break down, no evidence of skin tears noted. IV catheter discontinued intact. Site without signs and symptoms of complications. Dressing and pressure applied. Pt denies pain at the site currently. No complaints noted.  Patient free of lines, drains, and wounds.   An After Visit Summary (AVS) was printed and given to the patient. Patient escorted via wheelchair, and discharged home via private auto.  Mikki Santee, RN

## 2020-03-06 NOTE — Discharge Summary (Signed)
Physician Discharge Summary  Maxwell Fox [id number] DOB: [birth year] DOA: 03/03/2020  PCP: [name], MD  Admit date: 03/03/2020 Discharge date: 03/06/2020  Admitted From: Home Disposition: Home with home health PT  Recommendations for Outpatient Follow-up:  1. Follow up with PCP in 1-2 weeks 2. Please obtain BMP/CBC in one week   Home Health: Physical therapy Equipment/Devices: Gilford Rile present at home  Discharge Condition: Stable CODE STATUS: Full code Diet recommendation: Regular diet  Discharge summary: 76 year old gentleman with history of Roux-en-Y in 2000, chronic adrenal insufficiency on prednisone 5 mg daily at home, anxiety depression, hypothyroidism, metastatic melanoma status post resection of brain mets and immunotherapy, recent pelvic fracture with recent multiple admissions with upper GI bleeding presented to the ER with one episode of hematochezia.  EGD 1/12, 1/24, anastomotic bleeding.  2 units PRBC transfusion needed for hemorrhagic shock in ICU. EGD 1/30, nonbleeding superficial gastric ulcer at [place] anastomosis. Colonoscopy 2/1, grade 2 internal hemorrhoids, rectosigmoid diverticulosis, no active bleeding. Hemoglobin remained stable.  Patient was given 1 unit of iron before discharge. No more bleeding since admission this episode.  Clear bowel movement.  Plan: Previous GI bleeding thought to be from anastomotic ulcer that is without any evidence of recurrent bleed.  Patient is already on Protonix 40 mg twice a day and on sucralfate.  He is avoiding NSAIDs. Underwent colonoscopy and found to have internal hemorrhoids and diverticulosis.  No active bleeding.  Patient probably had intermittent bleeding from diverticulosis. As per GI recommendation, discharged home on hydrocortisone suppository. Will need repeat CBC in 1 week.  Patient has other chronic medical issues including chronic adrenal insufficiency that is stable on 5 mg of prednisone every day.  He is  on Synthroid.  Patient has suffered left superior inferior pubic rami fracture recently and has been working with physical therapy at home that he will resume.     Discharge Diagnoses:  Principal Problem:   Acute gastric ulcer with hemorrhage Active Problems:   Benign prostatic hyperplasia   Melanoma metastatic to brain [facility])   Adrenal insufficiency due to cancer therapy [facility])   Secondary hypothyroidism    Discharge Instructions  Discharge Instructions    Call MD for:  extreme fatigue   Complete by: As directed    Call MD for:  persistant dizziness or light-headedness   Complete by: As directed    Diet general   Complete by: As directed    Increase activity slowly   Complete by: As directed      Allergies as of 03/06/2020   No Known Allergies     Medication List    TAKE these medications   calcium carbonate 1250 (500 Ca) MG tablet Commonly known as: OS-CAL - dosed in mg of elemental calcium Take 1 tablet by mouth daily with supper.   clonazePAM 1 MG tablet Commonly known as: KLONOPIN Take 1 tablet (1 mg total) by mouth daily as needed. What changed: when to take this   escitalopram 10 MG tablet Commonly known as: LEXAPRO TAKE 1 TABLET(10 MG) BY MOUTH DAILY What changed:   how much to take  how to take this  when to take this  additional instructions   hydrocortisone 25 MG suppository Commonly known as: ANUSOL-HC 1 suppository every evening for 7 days,1 suppository every other evening   levothyroxine 88 MCG tablet Commonly known as: SYNTHROID Take 1 tablet (88 mcg total) by mouth daily before breakfast.   multivitamin with minerals Tabs tablet Take 1 tablet by mouth daily  with supper.   Oxycodone HCl 10 MG Tabs Take 1 tablet (10 mg total) by mouth every 6 (six) hours as needed. What changed: reasons to take this   pantoprazole 40 MG tablet Commonly known as: PROTONIX Take 1 tablet (40 mg total) by mouth 2 (two) times daily for 60 days, THEN 1  tablet (40 mg total) daily. Start taking on: February 20, 2020   polyethylene glycol 17 g packet Commonly known as: MIRALAX / GLYCOLAX Take 17 g by mouth daily as needed for mild constipation or moderate constipation.   predniSONE 5 MG tablet Commonly known as: DELTASONE Take 1 tablet (5 mg total) by mouth daily with breakfast.   sucralfate 1 GM/10ML suspension Commonly known as: CARAFATE Take 10 mLs (1 g total) by mouth 4 (four) times daily -  with meals and at bedtime for 14 days.   tamsulosin 0.4 MG Caps capsule Commonly known as: FLOMAX TAKE 1 CAPSULE(0.4 MG) BY MOUTH DAILY AFTER SUPPER What changed: See the new instructions.   Vitamin D 50 MCG (2000 UT) tablet Take 1 tablet (2,000 Units total) by mouth daily. What changed: when to take this       Follow-up Information    [name], MD Follow up in 2 week(s).   Specialty: Internal Medicine Why: will need repeat CBC Contact information: [place] [address] [place], [facility] Follow up.   Specialty: Home Health Services Contact information: [place] [address] [phone]              No Known Allergies  Consultations:  Gastroenterology   Procedures/Studies: DG Pelvis Portable  Result Date: 02/14/2020 CLINICAL DATA:  Left hip/pelvic pain.  History of fall. EXAM: PORTABLE PELVIS 1-2 VIEWS COMPARISON:  01/17/2020. FINDINGS: Left superior pubic ramus fracture again noted. Fracture of the left inferior pubic ramus is also most likely present. Hips are intact. Diffuse osteopenia. Degenerative change lumbar spine and both hips. IMPRESSION: Left superior pubic ramus fracture again noted. Fracture of the left inferior pubic ramus is also most likely present. No evidence of hip fracture. Electronically Signed   By: Marcello Moores  Register   On: 02/14/2020 06:08   DG Chest Port 1 View  Result Date: 02/14/2020 CLINICAL DATA:  Rectal  bleeding, fall 3 weeks prior EXAM: PORTABLE CHEST 1 VIEW COMPARISON:  01/17/2020 FINDINGS: No consolidation, features of edema, pneumothorax, or effusion. Pulmonary vascularity is normally distributed. The aorta is calcified. The remaining cardiomediastinal contours are unremarkable. No acute osseous or soft tissue abnormality. Degenerative changes are present in the imaged spine and left shoulder. Prior right shoulder arthroplasty. Surgical clips project over the bilateral axilla and in the upper abdomen. Telemetry leads overlie the chest. IMPRESSION: No acute cardiopulmonary abnormality. Aortic Atherosclerosis (ICD10-I70.0). Electronically Signed   By: Lovena Le M.D.   On: 02/14/2020 03:21   (Echo, Carotid, EGD, Colonoscopy, ERCP)    Subjective: Patient seen and examined.  No overnight events.  Denies any nausea vomiting.  He had bowel movement after the procedure and there was no blood.  Left side of the hip hurts on returning to left otherwise denies any pain.  Looking forward to go home.   Discharge Exam: Vitals:   03/06/20 0536 03/06/20 1149  BP: 137/68 (!) 150/75  Pulse: 63 62  Resp: 18 16  Temp: 98.4 F (36.9 C) 98.1 F (36.7 C)  SpO2: 96% 97%   Vitals:  03/05/20 1700 03/05/20 2131 03/06/20 0536 03/06/20 1149  BP: 130/60 (!) 95/58 137/68 (!) 150/75  Pulse: 60 (!) 58 63 62  Resp: 16 17 18 16   Temp: 98.4 F (36.9 C) 97.8 F (36.6 C) 98.4 F (36.9 C) 98.1 F (36.7 C)  TempSrc: Oral   Oral  SpO2: 98% 94% 96% 97%  Weight:      Height:        General: Pt is alert, awake, not in acute distress Cardiovascular: RRR, S1/S2 +, no rubs, no gallops Respiratory: CTA bilaterally, no wheezing, no rhonchi Abdominal: Soft, NT, ND, bowel sounds + Extremities: Patient has 2+ dependent edema.  Nontender.    The results of significant diagnostics from this hospitalization (including imaging, microbiology, ancillary and laboratory) are listed below for reference.      Microbiology: Recent Results (from the past 240 hour(s))  SARS Coronavirus 2 by RT PCR (hospital order, performed in [facility] hospital lab) Nasopharyngeal Nasopharyngeal Swab     Status: None   Collection Time: 02/26/20  1:45 AM   Specimen: Nasopharyngeal Swab  Result Value Ref Range Status   SARS Coronavirus 2 NEGATIVE NEGATIVE Final    Comment: (NOTE) SARS-CoV-2 target nucleic acids are NOT DETECTED.  The SARS-CoV-2 RNA is generally detectable in upper and lower respiratory specimens during the acute phase of infection. The lowest concentration of SARS-CoV-2 viral copies this assay can detect is 250 copies / mL. A negative result does not preclude SARS-CoV-2 infection and should not be used as the sole basis for treatment or other patient management decisions.  A negative result may occur with improper specimen collection / handling, submission of specimen other than nasopharyngeal swab, presence of viral mutation(s) within the areas targeted by this assay, and inadequate number of viral copies (<250 copies / mL). A negative result must be combined with clinical observations, patient history, and epidemiological information.  Fact Sheet for Patients:   StrictlyIdeas.no  Fact Sheet for Healthcare Providers: BankingDealers.co.za  This test is not yet approved or  cleared by the [place] FDA and has been authorized for detection and/or diagnosis of SARS-CoV-2 by FDA under an Emergency Use Authorization (EUA).  This EUA will remain in effect (meaning this test can be used) for the duration of the COVID-19 declaration under Section 564(b)(1) of the Act, 21 U.S.C. section 360bbb-3(b)(1), unless the authorization is terminated or revoked sooner.  Performed at [facility], [place] [address], [place], [place] [address]   MRSA PCR Screening     Status: None   Collection Time: 02/26/20  7:34 AM   Specimen: Nasopharyngeal   Result Value Ref Range Status   MRSA by PCR NEGATIVE NEGATIVE Final    Comment:        The GeneXpert MRSA Assay (FDA approved for NASAL specimens only), is one component of a comprehensive MRSA colonization surveillance program. It is not intended to diagnose MRSA infection nor to guide or monitor treatment for MRSA infections. Performed at [facility], [place] [address], [place],  [address]   SARS Coronavirus 2 by RT PCR (hospital order, performed in [facility] hospital lab) Nasopharyngeal Nasopharyngeal Swab     Status: None   Collection Time: 03/03/20  9:51 AM   Specimen: Nasopharyngeal Swab  Result Value Ref Range Status   SARS Coronavirus 2 NEGATIVE NEGATIVE Final    Comment: (NOTE) SARS-CoV-2 target nucleic acids are NOT DETECTED.  The SARS-CoV-2 RNA is generally detectable in upper and lower respiratory specimens  during the acute phase of infection. The lowest concentration of SARS-CoV-2 viral copies this assay can detect is 250 copies / mL. A negative result does not preclude SARS-CoV-2 infection and should not be used as the sole basis for treatment or other patient management decisions.  A negative result may occur with improper specimen collection / handling, submission of specimen other than nasopharyngeal swab, presence of viral mutation(s) within the areas targeted by this assay, and inadequate number of viral copies (<250 copies / mL). A negative result must be combined with clinical observations, patient history, and epidemiological information.  Fact Sheet for Patients:   StrictlyIdeas.no  Fact Sheet for Healthcare Providers: BankingDealers.co.za  This test is not yet approved or  cleared by the [place] FDA and has been authorized for detection and/or diagnosis of SARS-CoV-2 by FDA under an Emergency Use Authorization (EUA).  This EUA will remain in effect (meaning this test can be used) for  the duration of the COVID-19 declaration under Section 564(b)(1) of the Act, 21 U.S.C. section 360bbb-3(b)(1), unless the authorization is terminated or revoked sooner.  Performed at [facility], [place] [address], [place], [place] [address]      Labs: BNP (last 3 results) No results for input(s): BNP in the last 8760 hours. Basic Metabolic Panel: Recent Labs  Lab 03/03/20 0949 03/04/20 0432  NA 139 141  K 4.0 3.9  CL 106 108  CO2 27 25  GLUCOSE 97 143*  BUN 10 7*  CREATININE 0.94 0.81  CALCIUM 8.3* 8.0*   Liver Function Tests: Recent Labs  Lab 03/03/20 0949  AST 18  ALT 14  ALKPHOS 88  BILITOT 0.6  PROT 4.9*  ALBUMIN 2.6*   No results for input(s): LIPASE, AMYLASE in the last 168 hours. No results for input(s): AMMONIA in the last 168 hours. CBC: Recent Labs  Lab 03/03/20 0949 03/03/20 1528 03/04/20 0432 03/04/20 1715 03/05/20 0432  WBC 6.6 6.9 3.9* 7.6 6.4  HGB 9.9* 9.1* 8.9* 9.5* 8.3*  HCT 32.2* 28.5* 28.6* 30.8* 26.7*  MCV 101.6* 99.7 99.3 99.0 99.3  PLT 196 184 174 208 172   Cardiac Enzymes: No results for input(s): CKTOTAL, CKMB, CKMBINDEX, TROPONINI in the last 168 hours. BNP: Invalid input(s): POCBNP CBG: No results for input(s): GLUCAP in the last 168 hours. D-Dimer No results for input(s): DDIMER in the last 72 hours. Hgb A1c No results for input(s): HGBA1C in the last 72 hours. Lipid Profile No results for input(s): CHOL, HDL, LDLCALC, TRIG, CHOLHDL, LDLDIRECT in the last 72 hours. Thyroid function studies Recent Labs    03/04/20 0432  TSH 0.234*   Anemia work up No results for input(s): VITAMINB12, FOLATE, FERRITIN, TIBC, IRON, RETICCTPCT in the last 72 hours. Urinalysis    Component Value Date/Time   COLORURINE YELLOW 01/17/2020 1005   APPEARANCEUR CLEAR 01/17/2020 1005   LABSPEC 1.012 01/17/2020 1005   PHURINE 5.0 01/17/2020 1005   GLUCOSEU NEGATIVE 01/17/2020 1005   HGBUR MODERATE (A) 01/17/2020 1005   BILIRUBINUR  NEGATIVE 01/17/2020 1005   KETONESUR NEGATIVE 01/17/2020 1005   PROTEINUR NEGATIVE 01/17/2020 1005   NITRITE NEGATIVE 01/17/2020 1005   LEUKOCYTESUR NEGATIVE 01/17/2020 1005   Sepsis Labs Invalid input(s): PROCALCITONIN,  WBC,  LACTICIDVEN Microbiology Recent Results (from the past 240 hour(s))  SARS Coronavirus 2 by RT PCR (hospital order, performed in [facility] hospital lab) Nasopharyngeal Nasopharyngeal Swab     Status: None   Collection Time: 02/26/20  1:45 AM   Specimen:  Nasopharyngeal Swab  Result Value Ref Range Status   SARS Coronavirus 2 NEGATIVE NEGATIVE Final    Comment: (NOTE) SARS-CoV-2 target nucleic acids are NOT DETECTED.  The SARS-CoV-2 RNA is generally detectable in upper and lower respiratory specimens during the acute phase of infection. The lowest concentration of SARS-CoV-2 viral copies this assay can detect is 250 copies / mL. A negative result does not preclude SARS-CoV-2 infection and should not be used as the sole basis for treatment or other patient management decisions.  A negative result may occur with improper specimen collection / handling, submission of specimen other than nasopharyngeal swab, presence of viral mutation(s) within the areas targeted by this assay, and inadequate number of viral copies (<250 copies / mL). A negative result must be combined with clinical observations, patient history, and epidemiological information.  Fact Sheet for Patients:   StrictlyIdeas.no  Fact Sheet for Healthcare Providers: BankingDealers.co.za  This test is not yet approved or  cleared by the [place] FDA and has been authorized for detection and/or diagnosis of SARS-CoV-2 by FDA under an Emergency Use Authorization (EUA).  This EUA will remain in effect (meaning this test can be used) for the duration of the COVID-19 declaration under Section 564(b)(1) of the Act, 21 U.S.C. section 360bbb-3(b)(1),  unless the authorization is terminated or revoked sooner.  Performed at [facility], [place] [address], [place], [place] [address]   MRSA PCR Screening     Status: None   Collection Time: 02/26/20  7:34 AM   Specimen: Nasopharyngeal  Result Value Ref Range Status   MRSA by PCR NEGATIVE NEGATIVE Final    Comment:        The GeneXpert MRSA Assay (FDA approved for NASAL specimens only), is one component of a comprehensive MRSA colonization surveillance program. It is not intended to diagnose MRSA infection nor to guide or monitor treatment for MRSA infections. Performed at [facility], [place] [address] [place], [place] [address]   SARS Coronavirus 2 by RT PCR (hospital order, performed in [facility] hospital lab) Nasopharyngeal Nasopharyngeal Swab     Status: None   Collection Time: 03/03/20  9:51 AM   Specimen: Nasopharyngeal Swab  Result Value Ref Range Status   SARS Coronavirus 2 NEGATIVE NEGATIVE Final    Comment: (NOTE) SARS-CoV-2 target nucleic acids are NOT DETECTED.  The SARS-CoV-2 RNA is generally detectable in upper and lower respiratory specimens during the acute phase of infection. The lowest concentration of SARS-CoV-2 viral copies this assay can detect is 250 copies / mL. A negative result does not preclude SARS-CoV-2 infection and should not be used as the sole basis for treatment or other patient management decisions.  A negative result may occur with improper specimen collection / handling, submission of specimen other than nasopharyngeal swab, presence of viral mutation(s) within the areas targeted by this assay, and inadequate number of viral copies (<250 copies / mL). A negative result must be combined with clinical observations, patient history, and epidemiological information.  Fact Sheet for Patients:   StrictlyIdeas.no  Fact Sheet for Healthcare Providers: BankingDealers.co.za  This test is  not yet approved or  cleared by the [place] FDA and has been authorized for detection and/or diagnosis of SARS-CoV-2 by FDA under an Emergency Use Authorization (EUA).  This EUA will remain in effect (meaning this test can be used) for the duration of the COVID-19 declaration under Section 564(b)(1) of the Act, 21 U.S.C. section 360bbb-3(b)(1), unless the authorization is  terminated or revoked sooner.  Performed at [facility], [place] [address], [place], [place] [address]      Time coordinating discharge:  35 minutes  SIGNED:   Barb Merino, MD  [facility] 03/06/2020, 1:23 PM

## 2020-03-06 NOTE — Progress Notes (Signed)
Gastroenterology Inpatient Follow-up Note   PATIENT IDENTIFICATION  Maxwell Fox is a 75 y.o. male with a pmh significant for hypertension, status post gastric bypass, diverticulosis, BPH, anxiety/MDD, hemorrhoids, PUD.  Patient admitted with recurrent anemia and bright red blood per rectum. Hospital Day: 4  SUBJECTIVE  Patient is doing well status post colonoscopy yesterday.  He has not had a bowel movement since completion of his colonoscopy preparation.  Hemoglobin relatively stable.  No fevers or chills.  No abdominal pain.  He is hopeful for discharge later today or tomorrow.  No other concerns.   OBJECTIVE  Scheduled Inpatient Medications:  . escitalopram  10 mg Oral QHS  . hydrocortisone  25 mg Rectal QHS  . levothyroxine  88 mcg Oral QAC breakfast  . pantoprazole  40 mg Oral BID  . predniSONE  5 mg Oral Q breakfast  . sodium chloride flush  3 mL Intravenous Q12H  . sucralfate  1 g Oral TID WC & HS  . tamsulosin  0.4 mg Oral QPC supper   Continuous Inpatient Infusions:  . lactated ringers 100 mL/hr at 03/05/20 1043   PRN Inpatient Medications: acetaminophen **OR** acetaminophen, clonazePAM, hydrALAZINE, morphine injection, ondansetron **OR** ondansetron (ZOFRAN) IV, oxyCODONE   Physical Examination  Temp:  [97.6 F (36.4 C)-98.4 F (36.9 C)] 98.1 F (36.7 C) (02/02 1149) Pulse Rate:  [52-65] 62 (02/02 1149) Resp:  [9-23] 16 (02/02 1149) BP: (95-162)/(56-86) 150/75 (02/02 1149) SpO2:  [94 %-100 %] 97 % (02/02 1149) Temp (24hrs), Avg:98.1 F (36.7 C), Min:97.6 F (36.4 C), Max:98.4 F (36.9 C)  Weight: 88.7 kg GEN: NAD, appears stated age, doesn't appear chronically ill PSYCH: Cooperative, without pressured speech EYE: Conjunctivae pink, sclerae anicteric ENT: Masked CV: Nontachycardic  RESP: No audible wheezing GI: NABS, soft, NT/ND, without rebound or guarding MSK/EXT: No lower extremity edema SKIN: No jaundice NEURO:  Alert & Oriented x 3, no focal  deficits   Review of Data   Laboratory Studies   Recent Labs  Lab 03/04/20 0432  NA 141  K 3.9  CL 108  CO2 25  BUN 7*  CREATININE 0.81  GLUCOSE 143*  CALCIUM 8.0*   Recent Labs  Lab 03/03/20 0949  AST 18  ALT 14  ALKPHOS 88    Recent Labs  Lab 03/04/20 0432 03/04/20 1715 03/05/20 0432  WBC 3.9* 7.6 6.4  HGB 8.9* 9.5* 8.3*  HCT 28.6* 30.8* 26.7*  PLT 174 208 172   No results for input(s): APTT, INR in the last 168 hours.  Imaging Studies  No results found.  GI Procedures and Studies  Colonoscopy - Hemorrhoids found on digital rectal exam. - Normal mucosa in the entire examined colon. - Diverticulosis in the recto-sigmoid colon and in the sigmoid colon. - Non-bleeding non-thrombosed internal hemorrhoids.   ASSESSMENT  Maxwell Fox is a 75 y.o. male with a pmh significant for hypertension, status post gastric bypass, diverticulosis, BPH, anxiety/MDD, hemorrhoids, PUD.  Patient admitted with recurrent anemia and bright red blood per rectum.  The patient is hemodynamically and clinically stable.  Patient is not having any evidence of persistent blood loss anemia.  No bowel movements since completion of his bowel preparation for colonoscopy yesterday.  Although the underlying etiology for patient's symptoms is felt to be his gastrojejunal ulcer, his more recent representation the setting of having overt hematochezia may actually have been diverticular in origin since the upper endoscopy showed a continuing to improve ulcer and no treatment had been performed over the  weekend.  Certainly small bowel sources of GI bleeding could be occurring but I would have expected to have seen some sort of darkness or melanic appearing output if that were to have been the case.  Hemorrhoidal disease also possibility and he is being actively treated for that currently.  We recommend follow-up with PCP and a follow-up with Maxwell Fox in the coming weeks as he will need a follow-up  endoscopy to ensure healing of his gastrojejunal anastomosis ulcer.  All patient questions were answered to the best of my ability, and the patient agrees to the aforementioned plan of action with follow-up as indicated.   PLAN/RECOMMENDATIONS  Protonix 40 mg twice daily to be maintained Carafate 1 g before every meal plus nightly Repeat EGD in 3 months recommended in outpatient setting Recommend follow-up with PCP or repeat hemoglobin check in next 1 to 2 weeks We will schedule outpatient GI follow-up with his primary gastroenterologist Maxwell Fox Patient aware of symptoms and signs for which she should return to the hospital should he have recurrent GI bleeding Anusol suppositories nightly x1 week and every other evening for 2 weeks As needed Preparation H once to twice daily Sitz bath's as needed   GI will sign off.  Please page/call with questions or concerns.   Maxwell Britain, MD [facility] Office # [phone]    LOS: 3 days  Maxwell Fox  03/06/2020, 2:10 PM

## 2020-03-06 NOTE — TOC Progression Note (Signed)
Transition of Care [facility]) - Progression Note    Patient Details  Name: Teal Bontrager MRN: [id number] Date of Birth: [birth year]  Transition of [facility]) CM/SW Contact  Jacalyn Lefevre Edson Snowball, RN Phone Number: 03/06/2020, 12:34 PM  Clinical Narrative:     Patient from home with [facility] HHPT. Patient would like to continue, placed order. Cory with [facility] aware and accepted patient back.   Expected Discharge Plan: [place] Barriers to Discharge: No Barriers Identified  Expected Discharge Plan and Services Expected Discharge Plan: [place]   Discharge Planning Services: CM Consult Post Acute Care Choice: [place] arrangements for the past 2 months: Single Family Home Expected Discharge Date: 03/06/20               DME Arranged: N/A         HH Arranged: PT HH Agency: [place] Date [place]: 03/06/20 Time [place]: [phone] Representative spoke with at [place]: [place]) Interventions    Readmission Risk Interventions Readmission Risk Prevention Plan 02/28/2020  Transportation Screening Complete  PCP or Specialist Appt within 3-5 Days Not Complete  Not Complete comments 2/4 was first available appt  [place] or [place] Complete  Social Work Consult for [place] Planning/Counseling Complete  Palliative Care Screening Not Applicable  Some recent data might be hidden

## 2020-03-07 ENCOUNTER — Encounter (HOSPITAL_COMMUNITY): Payer: Self-pay | Admitting: Gastroenterology

## 2020-03-08 ENCOUNTER — Inpatient Hospital Stay: Payer: Medicare Other | Admitting: Internal Medicine

## 2020-03-10 DIAGNOSIS — S72002D Fracture of unspecified part of neck of left femur, subsequent encounter for closed fracture with routine healing: Secondary | ICD-10-CM | POA: Diagnosis not present

## 2020-03-10 DIAGNOSIS — K250 Acute gastric ulcer with hemorrhage: Secondary | ICD-10-CM | POA: Diagnosis not present

## 2020-03-10 DIAGNOSIS — T380X5D Adverse effect of glucocorticoids and synthetic analogues, subsequent encounter: Secondary | ICD-10-CM | POA: Diagnosis not present

## 2020-03-10 DIAGNOSIS — D62 Acute posthemorrhagic anemia: Secondary | ICD-10-CM | POA: Diagnosis not present

## 2020-03-10 DIAGNOSIS — S32810D Multiple fractures of pelvis with stable disruption of pelvic ring, subsequent encounter for fracture with routine healing: Secondary | ICD-10-CM | POA: Diagnosis not present

## 2020-03-10 DIAGNOSIS — K5791 Diverticulosis of intestine, part unspecified, without perforation or abscess with bleeding: Secondary | ICD-10-CM | POA: Diagnosis not present

## 2020-03-12 ENCOUNTER — Other Ambulatory Visit (INDEPENDENT_AMBULATORY_CARE_PROVIDER_SITE_OTHER): Payer: Medicare Other

## 2020-03-12 ENCOUNTER — Encounter: Payer: Self-pay | Admitting: Physician Assistant

## 2020-03-12 ENCOUNTER — Other Ambulatory Visit: Payer: Self-pay | Admitting: *Deleted

## 2020-03-12 ENCOUNTER — Ambulatory Visit (INDEPENDENT_AMBULATORY_CARE_PROVIDER_SITE_OTHER): Payer: Medicare Other | Admitting: Physician Assistant

## 2020-03-12 VITALS — BP 140/60 | HR 56 | Ht 71.0 in | Wt 202.0 lb

## 2020-03-12 DIAGNOSIS — K254 Chronic or unspecified gastric ulcer with hemorrhage: Secondary | ICD-10-CM

## 2020-03-12 DIAGNOSIS — K922 Gastrointestinal hemorrhage, unspecified: Secondary | ICD-10-CM

## 2020-03-12 DIAGNOSIS — Z9884 Bariatric surgery status: Secondary | ICD-10-CM | POA: Diagnosis not present

## 2020-03-12 DIAGNOSIS — D649 Anemia, unspecified: Secondary | ICD-10-CM

## 2020-03-12 LAB — CBC WITH DIFFERENTIAL/PLATELET
Basophils Absolute: 0 10*3/uL (ref 0.0–0.1)
Basophils Relative: 0.8 % (ref 0.0–3.0)
Eosinophils Absolute: 0 10*3/uL (ref 0.0–0.7)
Eosinophils Relative: 0.7 % (ref 0.0–5.0)
HCT: 32.6 % — ABNORMAL LOW (ref 39.0–52.0)
Hemoglobin: 10.6 g/dL — ABNORMAL LOW (ref 13.0–17.0)
Lymphocytes Relative: 18.2 % (ref 12.0–46.0)
Lymphs Abs: 1.1 10*3/uL (ref 0.7–4.0)
MCHC: 32.5 g/dL (ref 30.0–36.0)
MCV: 94.8 fl (ref 78.0–100.0)
Monocytes Absolute: 0.5 10*3/uL (ref 0.1–1.0)
Monocytes Relative: 8.1 % (ref 3.0–12.0)
Neutro Abs: 4.4 10*3/uL (ref 1.4–7.7)
Neutrophils Relative %: 72.2 % (ref 43.0–77.0)
Platelets: 244 10*3/uL (ref 150.0–400.0)
RBC: 3.44 Mil/uL — ABNORMAL LOW (ref 4.22–5.81)
RDW: 16.3 % — ABNORMAL HIGH (ref 11.5–15.5)
WBC: 6.1 10*3/uL (ref 4.0–10.5)

## 2020-03-12 MED ORDER — OMEPRAZOLE 40 MG PO CPDR: 40.0000 mg | DELAYED_RELEASE_CAPSULE | Freq: Two times a day (BID) | ORAL | 6 refills | Status: DC

## 2020-03-12 NOTE — Progress Notes (Signed)
Subjective:    Patient ID: Maxwell Fox, male    DOB: Jun 25, 1945, 75 y.o.   MRN: [id number]  HPI Maxwell Fox is a pleasant 75 year old white male, established with Dr. Hilarie Fredrickson, who comes in today for post hospital follow-up after recent admission with an acute GI bleed. He is status post remote Roux-en-Y gastric bypass, has history of both iron deficiency and B12 deficiency, history of metastatic melanoma to the brain, and has had a recent pubic fracture from which he is recovering.  Patient was admitted 1/24 through 02/29/2020 and then readmitted on 1/30 through 03/06/2020 due to recurrent bleeding. He underwent EGD on 02/26/2020 , after he presented with melena -this  revealed an anastomotic ulcer with visible vessel which was treated with epinephrine for hemostasis. He was readmitted with with more frank hematochezia on 03/03/2020.  He underwent repeat EGD per Dr. Benson Norway with finding of an 8 mm anastomotic ulcer with no stigmata of active bleeding.  Subsequent colonoscopy was then done on 03/05/2020 with finding of multiple diverticuli, internal hemorrhoids and otherwise normal exam.  It was felt possible that the hematochezia was secondary to internal hemorrhoidal bleeding. He was discharged on twice daily PPI, Carafate suspension, preparation H and Anusol HC suppositories.  Plan was for 61-month interval EGD to document healing. Last iron studies done in January 2022, prior to receiving IV iron this last admission showed a ferritin of 267, iron of 340 TIBC 220, sat 155 He did require transfusion this last admission and hemoglobin on discharge 8.3 hematocrit of 26.7. Patient says he has been feeling well since discharge, denies any lightheadedness or dizziness, no shortness of breath.  He has been having a lot of difficulty ambulating due to the recent pelvic fracture and is using a walker. He has no complaints of abdominal discomfort, appetite has been okay no nausea or vomiting.  He has not seen any melena or  hematochezia, and relates that he has some fear about this every time he has a bowel movement.  Review of Systems Pertinent positive and negative review of systems were noted in the above HPI section.  All other review of systems was otherwise negative.  Outpatient Encounter Medications as of 03/12/2020  Medication Sig  . calcium carbonate (OS-CAL - DOSED IN MG OF ELEMENTAL CALCIUM) 1250 (500 Ca) MG tablet Take 1 tablet by mouth daily with supper.  . Cholecalciferol (VITAMIN D) 2000 UNITS tablet Take 1 tablet (2,000 Units total) by mouth daily. (Patient taking differently: Take 2,000 Units by mouth daily with supper.)  . clonazePAM (KLONOPIN) 1 MG tablet Take 1 tablet (1 mg total) by mouth daily as needed. (Patient taking differently: Take 1 mg by mouth daily.)  . escitalopram (LEXAPRO) 10 MG tablet TAKE 1 TABLET(10 MG) BY MOUTH DAILY (Patient taking differently: Take 10 mg by mouth daily with supper.)  . hydrocortisone (ANUSOL-HC) 25 MG suppository 1 suppository every evening for 7 days,1 suppository every other evening  . levothyroxine (SYNTHROID) 88 MCG tablet Take 1 tablet (88 mcg total) by mouth daily before breakfast.  . Multiple Vitamin (MULTIVITAMIN WITH MINERALS) TABS tablet Take 1 tablet by mouth daily with supper.  Marland Kitchen omeprazole (PRILOSEC) 40 MG capsule Take 1 capsule (40 mg total) by mouth 2 (two) times daily.  . Oxycodone HCl 10 MG TABS Take 1 tablet (10 mg total) by mouth every 6 (six) hours as needed. (Patient taking differently: Take 10 mg by mouth every 6 (six) hours as needed (pain).)  . polyethylene glycol (MIRALAX / GLYCOLAX)  17 g packet Take 17 g by mouth daily as needed for mild constipation or moderate constipation.  . predniSONE (DELTASONE) 5 MG tablet Take 1 tablet (5 mg total) by mouth daily with breakfast.  . sucralfate (CARAFATE) 1 GM/10ML suspension Take 10 mLs (1 g total) by mouth 4 (four) times daily -  with meals and at bedtime for 14 days.  . tamsulosin (FLOMAX) 0.4 MG  CAPS capsule TAKE 1 CAPSULE(0.4 MG) BY MOUTH DAILY AFTER SUPPER (Patient taking differently: Take 0.4 mg by mouth daily after supper.)  . [DISCONTINUED] pantoprazole (PROTONIX) 40 MG tablet Take 1 tablet (40 mg total) by mouth 2 (two) times daily for 60 days, THEN 1 tablet (40 mg total) daily.   No facility-administered encounter medications on file as of 03/12/2020.   No Known Allergies Patient Active Problem List   Diagnosis Date Noted  . History of Roux-en-Y gastric bypass   . Hemorrhagic shock ([place])   . Acute gastric ulcer with hemorrhage   . Acute GI bleeding 02/14/2020  . Acute blood loss anemia 02/14/2020  . GI bleed 02/14/2020  . Melena   . Marginal ulcers   . Gastroesophageal reflux disease with esophagitis without hemorrhage   . Closed fracture of left superior pubic ramus ([place]) 02/09/2020  . Anxiety and depression 01/10/2018  . History of antineoplastic chemotherapy 07/19/2017  . Chronic midline low back pain without sciatica 04/19/2017  . Metastatic melanoma ([place]) 01/07/2017  . Adrenal insufficiency due to cancer therapy ([place]) 07/27/2016  . Secondary hypothyroidism 07/27/2016  . H/O gastric bypass 01/06/2016  . Pancreatic cyst 01/06/2016  . Melanoma metastatic to brain ([place]) 12/31/2015  . Iron deficiency anemia   . B12 nutritional deficiency   . Benign prostatic hyperplasia    Social History   Socioeconomic History  . Marital status: Married    Spouse name: Not on file  . Number of children: 2  . Years of education: Not on file  . Highest education level: Not on file  Occupational History  . Occupation: general contractor-retired  Tobacco Use  . Smoking status: Former Smoker    Quit date: 02/03/1963    Years since quitting: 36.1  . Smokeless tobacco: Never Used  Vaping Use  . Vaping Use: Never used  Substance and Sexual Activity  . Alcohol use: Not Currently    Comment: wine 2x/week  . Drug use: Never  . Sexual activity: Not on file  Other Topics Concern   . 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 Worried About Charity fundraiser in the Last Year: Never true  . Ran Out of Food in the Last Year: Never true  Transportation Needs: No Transportation Needs  . Lack of Transportation (Medical): No  . Lack of Transportation (Non-Medical): No  Physical Activity: Not on file  Stress: Not on file  Social Connections: Not on file  Intimate Partner Violence: Not on file    Mr. Engebretsen's family history includes Breast cancer in his sister; Hypertension in his father; Macular degeneration in his mother; Melanoma in his brother, mother, and sister; Stomach cancer in his sister; Uterine cancer in his sister.      Objective:    Vitals:   03/12/20 1500  BP: 140/60  Pulse: (!) 56  Physical Exam Well-developed well-nourished elderly white male in no acute distress.  Ambulating with a walker, accompanied by his daughter- height, Weight, BMI 202  HEENT; nontraumatic normocephalic, EOMI, PE R LA, sclera anicteric.  Pale Oropharynx; not examined today Neck; supple, no JVD Cardiovascular; regular rate and rhythm with S1-S2, no murmur rub or gallop Pulmonary; Clear bilaterally Abdomen; soft, nontender, nondistended, no palpable mass or hepatosplenomegaly, bowel sounds are active Rectal; not done today Skin; benign exam, no jaundice rash or appreciable lesions Extremities; no clubbing cyanosis or edema skin warm and dry Neuro/Psych; alert and oriented x4, grossly nonfocal mood and affect appropriate       Assessment & Plan:   #82  75 year old white male here for post hospital follow-up after recent admissions with acute GI bleeding.  Just discharged on 03/06/2020. Patient with acute bleed secondary to anastomotic ulceration with visible vessel,  patient is status post Roux-en-Y gastric bypass remotely. Readmission 2 days after initial admission with frank hematochezia prompting repeat EGD which showed a healing marginal ulcer 8 mm with no evidence/stigmata of active bleeding and colonoscopy was negative with exception of diverticulosis and internal hemorrhoids.  #2 anemia secondary to above #3 history of chronic anemia with baseline hemoglobin around 11 and prior history of both B12 and iron deficiency.  Patient has had iron infusions previously. Most recent iron studies January 2022 - for iron deficiency  #4 history of metastatic melanoma to the brain  Plan; CBC today, will follow serially and plan to follow-up iron studies in 3 months. Continue Carafate suspension 4 times daily to complete current prescription then stop Patient was discharged on pantoprazole 40 mg p.o. twice daily, he will complete the current prescription and then would change to omeprazole 40 mg p.o. twice daily and instructed to open capsule and sprinkle on applesauce or take with liquid to increase absorption We will plan twice daily dosing until repeat EGD to document healing, then go to once daily dosing long-term  Will plan for repeat upper endoscopy in the [place] with Dr. Hilarie Fredrickson early May 2022.  We will need to contact patient as soon as that schedule is available and get him scheduled.  He has completed COVID-19 vaccination.   Jaylyne Breese S Sherril Shipman PA-C 03/12/2020   Cc: [name], MD

## 2020-03-12 NOTE — Patient Instructions (Addendum)
If you are age 75 or older, your body mass index should be between 23-30. Your Body mass index is 28.17 kg/m. If this is out of the aforementioned range listed, please consider follow up with your Primary Care Provider.  If you are age 48 or younger, your body mass index should be between 19-25. Your Body mass index is 28.17 kg/m. If this is out of the aformentioned range listed, please consider follow up with your Primary Care Provider.   Your provider has requested that you go to the basement level for lab work before leaving today. Press "B" on the elevator. The lab is located at the first door on the left as you exit the elevator.  [place] current prescription for Carafate then stop use.  [place] current prescription for Pantoprazole 40 mg twice daily, then switch to Omeprazole 1 capsule twice daily. (Open the capsule into applesauce or liquid)  You will be due for your follow up Endoscopy in May. Our office will contact you to schedule your appointment.  Thank you for entrusting me with your care and choosing [facility].  [name], PA-C

## 2020-03-12 NOTE — Patient Outreach (Signed)
[facility]) Care Management  03/12/2020  Maxwell Fox [birth year] [id number]  Telephone outreach for hospitalization follow up for 3rd GI Bleed within a month. Unsuccessful, left message and requested a return call.  I will try to reach him again tomorrow.  [name] Myrtie Neither, MSN, [facility] Gerontological Nurse Practitioner [facility] Care Management [phone]

## 2020-03-13 ENCOUNTER — Other Ambulatory Visit: Payer: Self-pay | Admitting: *Deleted

## 2020-03-13 ENCOUNTER — Encounter: Payer: Self-pay | Admitting: *Deleted

## 2020-03-13 ENCOUNTER — Other Ambulatory Visit: Payer: Self-pay

## 2020-03-13 DIAGNOSIS — T380X5D Adverse effect of glucocorticoids and synthetic analogues, subsequent encounter: Secondary | ICD-10-CM | POA: Diagnosis not present

## 2020-03-13 DIAGNOSIS — S32810D Multiple fractures of pelvis with stable disruption of pelvic ring, subsequent encounter for fracture with routine healing: Secondary | ICD-10-CM | POA: Diagnosis not present

## 2020-03-13 DIAGNOSIS — K250 Acute gastric ulcer with hemorrhage: Secondary | ICD-10-CM | POA: Diagnosis not present

## 2020-03-13 DIAGNOSIS — D649 Anemia, unspecified: Secondary | ICD-10-CM

## 2020-03-13 DIAGNOSIS — D62 Acute posthemorrhagic anemia: Secondary | ICD-10-CM | POA: Diagnosis not present

## 2020-03-13 DIAGNOSIS — S72002D Fracture of unspecified part of neck of left femur, subsequent encounter for closed fracture with routine healing: Secondary | ICD-10-CM | POA: Diagnosis not present

## 2020-03-13 DIAGNOSIS — K5791 Diverticulosis of intestine, part unspecified, without perforation or abscess with bleeding: Secondary | ICD-10-CM | POA: Diagnosis not present

## 2020-03-13 NOTE — Progress Notes (Signed)
Addendum: Reviewed and agree with assessment and management plan. Temekia Caskey M, MD  

## 2020-03-13 NOTE — Patient Outreach (Signed)
[facility]) Care Management  03/13/2020  Maxwell Fox 1945/10/28 [id number]  Telephone outreach completed today to follow up on recent GI bleeding episodes and next steps for PT resumption.  Goals Addressed              This Visit's Progress     Patient Stated   .  I will observe and practice fall precautions to prevent a fall over the next month. (pt-stated)        03/13/20 - start date High Priority Expected end date: 04/11/20  03/13/20 Pt has not fallen since his pelvic fx. Reinforced fall precautions: Arise slowly, stand for 30 sec count, wear non skid slippers or socks, use walker, keep pathways clear, avoid hazards (like water on the floor) avoid movements that are risky, complete PT.    .  Will work with PT to regain mobility and be able to walk without assistance over the next 2 months. (pt-stated)        03/13/20 Long term goal  Start date 03/13/20 Expected end date 06/01/20  03/13/20 Discussed pt personal goals. He hopes to be able to walk without his walker in 2 months. He will resume PT tomorrow. NP has encouraged him to participate 100% in the therapy and to do the recommended exercises on other days.      Other   .  Will complete all annual wellness requirements that are due by the end of May.        Start date 03/13/20 Medium priority Expected end date: 07/02/20  03/13/20 Nurse reviewed gaps in care after conversation and will need to verify if he has completed his COVID vaccination series and had Tdap. All other wellness items have been verified as done.      We agreed to talk again in one week.  NP to reach out to MD for Rx pain for premedication to PT. Sent inbox message, high alert.  [name] Myrtie Neither, MSN, [facility] Gerontological Nurse Practitioner [facility] Care Management [phone]

## 2020-03-14 ENCOUNTER — Telehealth: Payer: Self-pay

## 2020-03-14 MED ORDER — OXYCODONE HCL 10 MG PO TABS: 10.0000 mg | ORAL_TABLET | Freq: Four times a day (QID) | ORAL | 0 refills | Status: DC | PRN

## 2020-03-14 NOTE — Telephone Encounter (Signed)
rx sent - if he is going to need this again he will need to f/u since this is a controlled substance

## 2020-03-14 NOTE — Telephone Encounter (Signed)
Spoke with patient and info given. He understands if he needs another refill he will need to be seen in office for follow up.

## 2020-03-19 ENCOUNTER — Telehealth: Payer: Self-pay

## 2020-03-19 NOTE — Telephone Encounter (Signed)
-----   Message from Alfredia Ferguson, PA-C sent at 03/12/2020  5:03 PM EST ----- Regarding: schedule EGD in [facility], I saw this patient in clinic today, follow-up post hospital with acute GI bleed secondary to anastomotic gastric ulcer. He will need to be scheduled for EGD in the [facility] with Dr. Hilarie Fredrickson for early May. Please keep him on your list and get him scheduled as soon as the May schedule is available, thank you He has completed COVID-19 vaccination

## 2020-03-19 NOTE — Telephone Encounter (Signed)
Pt scheduled for previsit 06/03/20@10 :30am, EGD scheduled in the [place] 06/10/20@8 :30am.

## 2020-03-20 ENCOUNTER — Other Ambulatory Visit: Payer: Self-pay | Admitting: *Deleted

## 2020-03-20 NOTE — Patient Outreach (Signed)
[facility]) Care Management  03/20/2020  Maxwell Fox 1945/10/06 [id number]  Telephone outreach for follow up GI Bleed and PT progress, Unsuccessful, left message and requested a return call.  [name] Myrtie Neither, MSN, [facility] Gerontological Nurse Practitioner [facility] Care Management [phone]

## 2020-03-20 NOTE — Telephone Encounter (Signed)
Spoke with pt and he is aware of appts. EGD is with Dr. Hilarie Fredrickson not Dr. Henrene Pastor as stated below.

## 2020-03-21 ENCOUNTER — Encounter: Payer: Self-pay | Admitting: Internal Medicine

## 2020-03-21 ENCOUNTER — Telehealth: Payer: Self-pay | Admitting: Internal Medicine

## 2020-03-21 ENCOUNTER — Other Ambulatory Visit: Payer: Self-pay

## 2020-03-21 DIAGNOSIS — S32810D Multiple fractures of pelvis with stable disruption of pelvic ring, subsequent encounter for fracture with routine healing: Secondary | ICD-10-CM | POA: Diagnosis not present

## 2020-03-21 DIAGNOSIS — S72002D Fracture of unspecified part of neck of left femur, subsequent encounter for closed fracture with routine healing: Secondary | ICD-10-CM | POA: Diagnosis not present

## 2020-03-21 DIAGNOSIS — K5791 Diverticulosis of intestine, part unspecified, without perforation or abscess with bleeding: Secondary | ICD-10-CM | POA: Diagnosis not present

## 2020-03-21 DIAGNOSIS — D62 Acute posthemorrhagic anemia: Secondary | ICD-10-CM | POA: Diagnosis not present

## 2020-03-21 DIAGNOSIS — K250 Acute gastric ulcer with hemorrhage: Secondary | ICD-10-CM | POA: Diagnosis not present

## 2020-03-21 DIAGNOSIS — T380X5D Adverse effect of glucocorticoids and synthetic analogues, subsequent encounter: Secondary | ICD-10-CM | POA: Diagnosis not present

## 2020-03-21 MED ORDER — OXYCODONE HCL 10 MG PO TABS: 10.0000 mg | ORAL_TABLET | Freq: Four times a day (QID) | ORAL | 0 refills | Status: DC | PRN

## 2020-03-21 NOTE — Telephone Encounter (Signed)
Spoke with patient today. 

## 2020-03-21 NOTE — Telephone Encounter (Signed)
Patient called and said that he will be out of his pain medication on Saturday and was wondering what he needed to do until he sees Dr. Quay Burow on 2.22.22. Please advise

## 2020-03-21 NOTE — Telephone Encounter (Signed)
Ok done erx 

## 2020-03-26 ENCOUNTER — Ambulatory Visit (INDEPENDENT_AMBULATORY_CARE_PROVIDER_SITE_OTHER): Payer: Medicare Other | Admitting: Internal Medicine

## 2020-03-26 ENCOUNTER — Encounter: Payer: Self-pay | Admitting: Internal Medicine

## 2020-03-26 ENCOUNTER — Other Ambulatory Visit: Payer: Self-pay

## 2020-03-26 VITALS — BP 108/62 | HR 67 | Temp 98.4°F | Ht 71.0 in

## 2020-03-26 DIAGNOSIS — K254 Chronic or unspecified gastric ulcer with hemorrhage: Secondary | ICD-10-CM | POA: Diagnosis not present

## 2020-03-26 DIAGNOSIS — S32512D Fracture of superior rim of left pubis, subsequent encounter for fracture with routine healing: Secondary | ICD-10-CM

## 2020-03-26 DIAGNOSIS — F419 Anxiety disorder, unspecified: Secondary | ICD-10-CM | POA: Diagnosis not present

## 2020-03-26 DIAGNOSIS — F32A Depression, unspecified: Secondary | ICD-10-CM | POA: Diagnosis not present

## 2020-03-26 MED ORDER — OXYCODONE HCL 10 MG PO TABS: 10.0000 mg | ORAL_TABLET | Freq: Four times a day (QID) | ORAL | 0 refills | Status: DC | PRN

## 2020-03-26 NOTE — Patient Instructions (Addendum)
  Blood work was ordered for the [facility] - have it done in march.     Medications changes include :   None   Your prescription(s) have been submitted to your pharmacy. Please take as directed and contact our office if you believe you are having problem(s) with the medication(s).

## 2020-03-26 NOTE — Assessment & Plan Note (Signed)
Chronic  Pain is significant Has not had a chance to heal  Just restart PT - has 3-6 more weeks Needs pain medications to control pain and to be able to do PT Denies side effects Will continue oxycodone 10 mg 4 times a day for the next month or so - he will try to slowly decrease pain medication  If he still requires pain medication after two months will need to follow up with me [place] controlled substance database checked.  Ok to fill medication.  Refill due in two days.

## 2020-03-26 NOTE — Progress Notes (Signed)
Subjective:    Patient ID: Maxwell Fox, male    DOB: March 23, 1945, 75 y.o.   MRN: [id number]  HPI The patient is here for follow up of their chronic medical problems. He has been in the hospital 3 times since he saw me last. He had recurrent GI bleeding.   EGD 1/12 anastomotic bleeding. 2 units PRBCs given EGD 1/30 non-bleeding superficial gastric ulcer at [place] anastomosis Colonoscopy 2/1 - no active bleeding   Closed fx of left superior pubic ramus - he is doing PT.  He takes the pain medication before PT and about 2 hrs after.  He does take 2 other pills during the day.  he is taking 4 a day.  He denies side effects.    PT 3/ week.  Using walker.  needs someone there.  Not steady.   PT for at least three more weeks - maybe 6 weeks.  Wants to get off pain medications as quickly as possible.    No signs of GI bleeding.  Sees GI in May.         Medications and allergies reviewed with patient and updated if appropriate.  Patient Active Problem List   Diagnosis Date Noted  . History of Roux-en-Y gastric bypass   . Hemorrhagic shock ([place])   . Acute gastric ulcer with hemorrhage   . Acute GI bleeding 02/14/2020  . Acute blood loss anemia 02/14/2020  . GI bleed 02/14/2020  . Melena   . Marginal ulcers   . Gastroesophageal reflux disease with esophagitis without hemorrhage   . Closed fracture of left superior pubic ramus ([place]) 02/09/2020  . Anxiety and depression 01/10/2018  . History of antineoplastic chemotherapy 07/19/2017  . Chronic midline low back pain without sciatica 04/19/2017  . Adrenal insufficiency due to cancer therapy ([place]) 07/27/2016  . Secondary hypothyroidism 07/27/2016  . H/O gastric bypass 01/06/2016  . Pancreatic cyst 01/06/2016  . Melanoma metastatic to brain ([place]) 12/31/2015  . Iron deficiency anemia   . B12 nutritional deficiency   . Benign prostatic hyperplasia     Current Outpatient Medications on File Prior to Visit  Medication Sig  Dispense Refill  . calcium carbonate (OS-CAL - DOSED IN MG OF ELEMENTAL CALCIUM) 1250 (500 Ca) MG tablet Take 1 tablet by mouth daily with supper.    . Cholecalciferol (VITAMIN D) 2000 UNITS tablet Take 1 tablet (2,000 Units total) by mouth daily. (Patient taking differently: Take 2,000 Units by mouth daily with supper.) 30 tablet 11  . clonazePAM (KLONOPIN) 1 MG tablet Take 1 tablet (1 mg total) by mouth daily as needed. (Patient taking differently: Take 1 mg by mouth daily.) 5 tablet 0  . escitalopram (LEXAPRO) 10 MG tablet TAKE 1 TABLET(10 MG) BY MOUTH DAILY (Patient taking differently: Take 10 mg by mouth daily with supper.) 90 tablet 0  . hydrocortisone (ANUSOL-HC) 25 MG suppository 1 suppository every evening for 7 days,1 suppository every other evening 24 suppository 0  . levothyroxine (SYNTHROID) 88 MCG tablet Take 1 tablet (88 mcg total) by mouth daily before breakfast. 90 tablet 3  . Multiple Vitamin (MULTIVITAMIN WITH MINERALS) TABS tablet Take 1 tablet by mouth daily with supper.    Marland Kitchen omeprazole (PRILOSEC) 40 MG capsule Take 1 capsule (40 mg total) by mouth 2 (two) times daily. 60 capsule 6  . Oxycodone HCl 10 MG TABS Take 1 tablet (10 mg total) by mouth every 6 (six) hours as needed (chronic pain - pelvic fx). 28 tablet 0  .  polyethylene glycol (MIRALAX / GLYCOLAX) 17 g packet Take 17 g by mouth daily as needed for mild constipation or moderate constipation. 14 each 0  . predniSONE (DELTASONE) 5 MG tablet Take 1 tablet (5 mg total) by mouth daily with breakfast. 120 tablet 3  . tamsulosin (FLOMAX) 0.4 MG CAPS capsule TAKE 1 CAPSULE(0.4 MG) BY MOUTH DAILY AFTER SUPPER (Patient taking differently: Take 0.4 mg by mouth daily after supper.) 90 capsule 1  . sucralfate (CARAFATE) 1 GM/10ML suspension Take 10 mLs (1 g total) by mouth 4 (four) times daily -  with meals and at bedtime for 14 days. 560 mL 0   No current facility-administered medications on file prior to visit.    Past Medical  History:  Diagnosis Date  . B12 nutritional deficiency    malabsorbtion  . BPH (benign prostatic hypertrophy)   . Diverticulosis   . GI bleeding 02/2020  . Hypertension   . Iron deficiency anemia    malabsorption related (s/p gastric bypass)  . Melanoma ([place])   . Obesity    s/p gastric bypass 2000, start 355#  . Pancreatic cyst   . Pulmonary nodules     Past Surgical History:  Procedure Laterality Date  . COLONOSCOPY WITH PROPOFOL N/A 03/05/2020   Procedure: COLONOSCOPY WITH PROPOFOL;  Surgeon: Rush Landmark Telford Nab., MD;  Location: [facility];  Service: Gastroenterology;  Laterality: N/A;  . CRANIOTOMY FOR TUMOR  12/11/2015   metastatic melanoma  . ESOPHAGOGASTRODUODENOSCOPY (EGD) WITH PROPOFOL N/A 02/14/2020   Procedure: ESOPHAGOGASTRODUODENOSCOPY (EGD) WITH PROPOFOL;  Surgeon: Lavena Bullion, DO;  Location: [facility];  Service: Endoscopy;  Laterality: N/A;  . ESOPHAGOGASTRODUODENOSCOPY (EGD) WITH PROPOFOL N/A 02/26/2020   Procedure: ESOPHAGOGASTRODUODENOSCOPY (EGD) WITH PROPOFOL;  Surgeon: Irene Shipper, MD;  Location: [facility] ENDOSCOPY;  Service: Endoscopy;  Laterality: N/A;  . ESOPHAGOGASTRODUODENOSCOPY (EGD) WITH PROPOFOL N/A 03/03/2020   Procedure: ESOPHAGOGASTRODUODENOSCOPY (EGD) WITH PROPOFOL;  Surgeon: Carol Ada, MD;  Location: [facility];  Service: Endoscopy;  Laterality: N/A;  . HEMOSTASIS CONTROL  02/26/2020   Procedure: HEMOSTASIS CONTROL;  Surgeon: Irene Shipper, MD;  Location: [facility];  Service: Endoscopy;;  . HOT HEMOSTASIS N/A 02/26/2020   Procedure: HOT HEMOSTASIS (ARGON PLASMA COAGULATION/BICAP);  Surgeon: Irene Shipper, MD;  Location: [facility] ENDOSCOPY;  Service: Endoscopy;  Laterality: N/A;  . MOHS SURGERY  2012   melanoma, mid back, [place]  . ROUX-EN-Y PROCEDURE  2000  . TOTAL SHOULDER REPLACEMENT Right 2011   [place]    Social History   Socioeconomic History  . Marital status: Married    Spouse name: Not on file  . Number of children: 2  .  Years of education: Not on file  . Highest education level: Not on file  Occupational History  . Occupation: general contractor-retired  Tobacco Use  . Smoking status: Former Smoker    Quit date: 02/03/1963    Years since quitting: 77.1  . Smokeless tobacco: Never Used  Vaping Use  . Vaping Use: Never used  Substance and Sexual Activity  . Alcohol use: Not Currently    Comment: wine 2x/week  . Drug use: Never  . Sexual activity: Not on file  Other Topics Concern  . 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 Worried About Running  Out of Food in the Last Year: Never true  . Ran Out of Food in the Last Year: Never true  Transportation Needs: No Transportation Needs  . Lack of Transportation (Medical): No  . Lack of Transportation (Non-Medical): No  Physical Activity: Not on file  Stress: Not on file  Social Connections: Not on file    Family History  Problem Relation Age of Onset  . Hypertension Father   . Macular degeneration Mother   . Melanoma Mother   . Melanoma Sister   . Breast cancer Sister   . Uterine cancer Sister   . Stomach cancer Sister   . Melanoma Brother     Review of Systems  Constitutional: Positive for fatigue. Negative for appetite change, chills and fever.  Respiratory: Negative for cough, shortness of breath and wheezing.   Cardiovascular: Negative for chest pain, palpitations and leg swelling.  Gastrointestinal: Negative for abdominal pain, blood in stool (no black stool) and nausea.       No gerd  Neurological: Negative for dizziness, light-headedness and headaches.       Objective:   Vitals:   03/26/20 1340  BP: 108/62  Pulse: 67  Temp: 98.4 F (36.9 C)  SpO2: 94%   BP Readings from Last 3 Encounters:  03/26/20 108/62  03/12/20 140/60   03/06/20 (!) 150/75   Wt Readings from Last 3 Encounters:  03/12/20 202 lb (91.6 kg)  03/03/20 195 lb 8.8 oz (88.7 kg)  02/27/20 203 lb 0.7 oz (92.1 kg)   Body mass index is 28.17 kg/m.   Physical Exam    Constitutional: Appears well-developed and well-nourished. No distress.  HENT:  Head: Normocephalic and atraumatic.  Neck: Neck supple. No tracheal deviation present. No thyromegaly present.  No cervical lymphadenopathy Cardiovascular: Normal rate, regular rhythm and normal heart sounds.   No murmur heard. No carotid bruit .  No edema Pulmonary/Chest: Effort normal and breath sounds normal. No respiratory distress. No has no wheezes. No rales.  Abdomen: soft, NT, ND Skin: Skin is warm and dry. Not diaphoretic.  Psychiatric: Normal mood and affect. Behavior is normal.      Assessment & Plan:    See Problem List for Assessment and Plan of chronic medical problems.    This visit occurred during the SARS-CoV-2 public health emergency.  Safety protocols were in place, including screening questions prior to the visit, additional usage of staff PPE, and extensive cleaning of exam room while observing appropriate contact time as indicated for disinfecting solutions.

## 2020-03-26 NOTE — Assessment & Plan Note (Signed)
Chronic Controlled, stable Continue lexapro 10 mg daily  

## 2020-03-27 ENCOUNTER — Encounter (HOSPITAL_COMMUNITY): Payer: Self-pay

## 2020-03-27 ENCOUNTER — Other Ambulatory Visit: Payer: Self-pay | Admitting: *Deleted

## 2020-03-27 ENCOUNTER — Telehealth: Payer: Self-pay | Admitting: Internal Medicine

## 2020-03-27 ENCOUNTER — Inpatient Hospital Stay (HOSPITAL_COMMUNITY)
Admission: EM | Admit: 2020-03-27 | Discharge: 2020-04-01 | DRG: 377 | Disposition: A | Discharge status: Home / Self Care | Payer: Medicare Other | Attending: Internal Medicine | Admitting: Internal Medicine

## 2020-03-27 ENCOUNTER — Emergency Department (HOSPITAL_COMMUNITY): Payer: Medicare Other

## 2020-03-27 ENCOUNTER — Other Ambulatory Visit: Payer: Self-pay

## 2020-03-27 ENCOUNTER — Other Ambulatory Visit: Payer: Self-pay | Admitting: Internal Medicine

## 2020-03-27 DIAGNOSIS — D649 Anemia, unspecified: Secondary | ICD-10-CM

## 2020-03-27 DIAGNOSIS — E2749 Other adrenocortical insufficiency: Secondary | ICD-10-CM | POA: Diagnosis present

## 2020-03-27 DIAGNOSIS — S32512D Fracture of superior rim of left pubis, subsequent encounter for fracture with routine healing: Secondary | ICD-10-CM | POA: Diagnosis not present

## 2020-03-27 DIAGNOSIS — T85898A Other specified complication of other internal prosthetic devices, implants and grafts, initial encounter: Secondary | ICD-10-CM | POA: Diagnosis not present

## 2020-03-27 DIAGNOSIS — Z79890 Hormone replacement therapy (postmenopausal): Secondary | ICD-10-CM

## 2020-03-27 DIAGNOSIS — Z79899 Other long term (current) drug therapy: Secondary | ICD-10-CM | POA: Diagnosis not present

## 2020-03-27 DIAGNOSIS — I10 Essential (primary) hypertension: Secondary | ICD-10-CM | POA: Diagnosis present

## 2020-03-27 DIAGNOSIS — K862 Cyst of pancreas: Secondary | ICD-10-CM | POA: Diagnosis not present

## 2020-03-27 DIAGNOSIS — K5730 Diverticulosis of large intestine without perforation or abscess without bleeding: Secondary | ICD-10-CM | POA: Diagnosis not present

## 2020-03-27 DIAGNOSIS — E273 Drug-induced adrenocortical insufficiency: Secondary | ICD-10-CM | POA: Diagnosis present

## 2020-03-27 DIAGNOSIS — Z8249 Family history of ischemic heart disease and other diseases of the circulatory system: Secondary | ICD-10-CM | POA: Diagnosis not present

## 2020-03-27 DIAGNOSIS — Z808 Family history of malignant neoplasm of other organs or systems: Secondary | ICD-10-CM

## 2020-03-27 DIAGNOSIS — R1084 Generalized abdominal pain: Secondary | ICD-10-CM

## 2020-03-27 DIAGNOSIS — K250 Acute gastric ulcer with hemorrhage: Secondary | ICD-10-CM | POA: Diagnosis not present

## 2020-03-27 DIAGNOSIS — E039 Hypothyroidism, unspecified: Secondary | ICD-10-CM | POA: Diagnosis present

## 2020-03-27 DIAGNOSIS — R9389 Abnormal findings on diagnostic imaging of other specified body structures: Secondary | ICD-10-CM | POA: Diagnosis not present

## 2020-03-27 DIAGNOSIS — K259 Gastric ulcer, unspecified as acute or chronic, without hemorrhage or perforation: Secondary | ICD-10-CM | POA: Diagnosis not present

## 2020-03-27 DIAGNOSIS — F419 Anxiety disorder, unspecified: Secondary | ICD-10-CM | POA: Diagnosis present

## 2020-03-27 DIAGNOSIS — X58XXXD Exposure to other specified factors, subsequent encounter: Secondary | ICD-10-CM | POA: Diagnosis present

## 2020-03-27 DIAGNOSIS — Z9884 Bariatric surgery status: Secondary | ICD-10-CM

## 2020-03-27 DIAGNOSIS — K254 Chronic or unspecified gastric ulcer with hemorrhage: Principal | ICD-10-CM | POA: Diagnosis present

## 2020-03-27 DIAGNOSIS — F32A Depression, unspecified: Secondary | ICD-10-CM | POA: Diagnosis present

## 2020-03-27 DIAGNOSIS — Z79891 Long term (current) use of opiate analgesic: Secondary | ICD-10-CM | POA: Diagnosis not present

## 2020-03-27 DIAGNOSIS — R578 Other shock: Secondary | ICD-10-CM | POA: Diagnosis not present

## 2020-03-27 DIAGNOSIS — R933 Abnormal findings on diagnostic imaging of other parts of digestive tract: Secondary | ICD-10-CM | POA: Diagnosis not present

## 2020-03-27 DIAGNOSIS — N400 Enlarged prostate without lower urinary tract symptoms: Secondary | ICD-10-CM | POA: Diagnosis present

## 2020-03-27 DIAGNOSIS — K921 Melena: Secondary | ICD-10-CM | POA: Diagnosis not present

## 2020-03-27 DIAGNOSIS — Z20822 Contact with and (suspected) exposure to covid-19: Secondary | ICD-10-CM | POA: Diagnosis present

## 2020-03-27 DIAGNOSIS — Z96611 Presence of right artificial shoulder joint: Secondary | ICD-10-CM | POA: Diagnosis present

## 2020-03-27 DIAGNOSIS — D62 Acute posthemorrhagic anemia: Secondary | ICD-10-CM | POA: Diagnosis present

## 2020-03-27 DIAGNOSIS — I959 Hypotension, unspecified: Secondary | ICD-10-CM | POA: Diagnosis present

## 2020-03-27 DIAGNOSIS — S32512A Fracture of superior rim of left pubis, initial encounter for closed fracture: Secondary | ICD-10-CM | POA: Diagnosis present

## 2020-03-27 DIAGNOSIS — C7931 Secondary malignant neoplasm of brain: Secondary | ICD-10-CM | POA: Diagnosis present

## 2020-03-27 DIAGNOSIS — D509 Iron deficiency anemia, unspecified: Secondary | ICD-10-CM | POA: Diagnosis not present

## 2020-03-27 DIAGNOSIS — S32592D Other specified fracture of left pubis, subsequent encounter for fracture with routine healing: Secondary | ICD-10-CM

## 2020-03-27 DIAGNOSIS — K253 Acute gastric ulcer without hemorrhage or perforation: Secondary | ICD-10-CM

## 2020-03-27 DIAGNOSIS — K284 Chronic or unspecified gastrojejunal ulcer with hemorrhage: Secondary | ICD-10-CM | POA: Diagnosis not present

## 2020-03-27 DIAGNOSIS — K289 Gastrojejunal ulcer, unspecified as acute or chronic, without hemorrhage or perforation: Secondary | ICD-10-CM

## 2020-03-27 DIAGNOSIS — J90 Pleural effusion, not elsewhere classified: Secondary | ICD-10-CM | POA: Diagnosis not present

## 2020-03-27 DIAGNOSIS — T182XXA Foreign body in stomach, initial encounter: Secondary | ICD-10-CM

## 2020-03-27 DIAGNOSIS — C439 Malignant melanoma of skin, unspecified: Secondary | ICD-10-CM | POA: Diagnosis not present

## 2020-03-27 DIAGNOSIS — K922 Gastrointestinal hemorrhage, unspecified: Secondary | ICD-10-CM

## 2020-03-27 DIAGNOSIS — K449 Diaphragmatic hernia without obstruction or gangrene: Secondary | ICD-10-CM | POA: Diagnosis not present

## 2020-03-27 DIAGNOSIS — J9811 Atelectasis: Secondary | ICD-10-CM | POA: Diagnosis present

## 2020-03-27 DIAGNOSIS — K2100 Gastro-esophageal reflux disease with esophagitis, without bleeding: Secondary | ICD-10-CM | POA: Diagnosis not present

## 2020-03-27 DIAGNOSIS — Z09 Encounter for follow-up examination after completed treatment for conditions other than malignant neoplasm: Secondary | ICD-10-CM

## 2020-03-27 DIAGNOSIS — Z8719 Personal history of other diseases of the digestive system: Secondary | ICD-10-CM | POA: Diagnosis present

## 2020-03-27 DIAGNOSIS — J690 Pneumonitis due to inhalation of food and vomit: Secondary | ICD-10-CM | POA: Diagnosis not present

## 2020-03-27 DIAGNOSIS — R109 Unspecified abdominal pain: Secondary | ICD-10-CM | POA: Diagnosis not present

## 2020-03-27 LAB — CBC
HCT: 26.4 % — ABNORMAL LOW (ref 39.0–52.0)
Hemoglobin: 7.9 g/dL — ABNORMAL LOW (ref 13.0–17.0)
MCH: 30.3 pg (ref 26.0–34.0)
MCHC: 29.9 g/dL — ABNORMAL LOW (ref 30.0–36.0)
MCV: 101.1 fL — ABNORMAL HIGH (ref 80.0–100.0)
Platelets: 207 10*3/uL (ref 150–400)
RBC: 2.61 MIL/uL — ABNORMAL LOW (ref 4.22–5.81)
RDW: 15.1 % (ref 11.5–15.5)
WBC: 8.1 10*3/uL (ref 4.0–10.5)
nRBC: 0 % (ref 0.0–0.2)

## 2020-03-27 LAB — RESP PANEL BY RT-PCR (FLU A&B, COVID) ARPGX2
Influenza A by PCR: NEGATIVE
Influenza B by PCR: NEGATIVE
SARS Coronavirus 2 by RT PCR: NEGATIVE

## 2020-03-27 LAB — HEMOGLOBIN AND HEMATOCRIT, BLOOD
HCT: 26.9 % — ABNORMAL LOW (ref 39.0–52.0)
Hemoglobin: 8.4 g/dL — ABNORMAL LOW (ref 13.0–17.0)

## 2020-03-27 LAB — COMPREHENSIVE METABOLIC PANEL
ALT: 14 U/L (ref 0–44)
AST: 24 U/L (ref 15–41)
Albumin: 2.6 g/dL — ABNORMAL LOW (ref 3.5–5.0)
Alkaline Phosphatase: 62 U/L (ref 38–126)
Anion gap: 8 (ref 5–15)
BUN: 22 mg/dL (ref 8–23)
CO2: 22 mmol/L (ref 22–32)
Calcium: 8.2 mg/dL — ABNORMAL LOW (ref 8.9–10.3)
Chloride: 111 mmol/L (ref 98–111)
Creatinine, Ser: 1 mg/dL (ref 0.61–1.24)
GFR, Estimated: >60 mL/min (ref >60)
Glucose, Bld: 80 mg/dL (ref 70–99)
Potassium: 4 mmol/L (ref 3.5–5.1)
Sodium: 141 mmol/L (ref 135–145)
Total Bilirubin: 0.6 mg/dL (ref 0.3–1.2)
Total Protein: 4.8 g/dL — ABNORMAL LOW (ref 6.5–8.1)

## 2020-03-27 LAB — PREPARE RBC (CROSSMATCH)

## 2020-03-27 LAB — POC OCCULT BLOOD, ED: Fecal Occult Bld: POSITIVE — AB

## 2020-03-27 MED ORDER — PANTOPRAZOLE SODIUM 40 MG IV SOLR
40.0000 mg | Freq: Two times a day (BID) | INTRAVENOUS | Status: DC
  Administered 2020-03-27 – 2020-04-01 (×10): 40 mg via INTRAVENOUS
  Filled 2020-03-27 (×10): qty 40

## 2020-03-27 MED ORDER — PEG-KCL-NACL-NASULF-NA ASC-C 100 G PO SOLR
0.5000 | Freq: Once | ORAL | Status: AC
  Administered 2020-03-28: 100 g via ORAL
  Filled 2020-03-27: qty 1

## 2020-03-27 MED ORDER — FENTANYL CITRATE (PF) 100 MCG/2ML IJ SOLN
50.0000 ug | Freq: Once | INTRAMUSCULAR | Status: AC
  Administered 2020-03-27: 50 ug via INTRAVENOUS
  Filled 2020-03-27: qty 2

## 2020-03-27 MED ORDER — LEVOTHYROXINE SODIUM 88 MCG PO TABS
88.0000 ug | ORAL_TABLET | Freq: Every day | ORAL | Status: DC
  Administered 2020-03-28 – 2020-04-01 (×5): 88 ug via ORAL
  Filled 2020-03-27 (×5): qty 1

## 2020-03-27 MED ORDER — OXYCODONE HCL 5 MG PO TABS
10.0000 mg | ORAL_TABLET | Freq: Four times a day (QID) | ORAL | Status: DC | PRN
Start: 2020-03-27 — End: 2020-04-01
  Administered 2020-03-27 – 2020-04-01 (×13): 10 mg via ORAL
  Filled 2020-03-27 (×13): qty 2

## 2020-03-27 MED ORDER — SODIUM CHLORIDE 0.9 % IV SOLN: 10.0000 mL/h | Freq: Once | INTRAVENOUS | Status: DC

## 2020-03-27 MED ORDER — SODIUM CHLORIDE 0.9% FLUSH
3.0000 mL | Freq: Two times a day (BID) | INTRAVENOUS | Status: DC
  Administered 2020-03-28 – 2020-04-01 (×9): 3 mL via INTRAVENOUS

## 2020-03-27 MED ORDER — PREDNISONE 5 MG PO TABS
5.0000 mg | ORAL_TABLET | Freq: Every day | ORAL | Status: DC
  Administered 2020-03-28 – 2020-04-01 (×5): 5 mg via ORAL
  Filled 2020-03-27 (×5): qty 1

## 2020-03-27 MED ORDER — HYDROMORPHONE HCL 1 MG/ML IJ SOLN
1.0000 mg | INTRAMUSCULAR | Status: DC | PRN
  Administered 2020-03-27 – 2020-03-30 (×6): 1 mg via INTRAVENOUS
  Filled 2020-03-27 (×6): qty 1

## 2020-03-27 MED ORDER — HYDROCODONE-ACETAMINOPHEN 5-325 MG PO TABS
1.0000 | ORAL_TABLET | ORAL | Status: DC | PRN
  Administered 2020-03-31: 2 via ORAL
  Filled 2020-03-27: qty 2

## 2020-03-27 MED ORDER — IOHEXOL 350 MG/ML SOLN
100.0000 mL | Freq: Once | INTRAVENOUS | Status: AC | PRN
  Administered 2020-03-27: 100 mL via INTRAVENOUS

## 2020-03-27 MED ORDER — CLONAZEPAM 1 MG PO TABS
1.0000 mg | ORAL_TABLET | Freq: Every day | ORAL | Status: DC | PRN
  Administered 2020-03-27 – 2020-03-29 (×2): 1 mg via ORAL
  Filled 2020-03-27: qty 1
  Filled 2020-03-27: qty 2

## 2020-03-27 MED ORDER — ESCITALOPRAM OXALATE 10 MG PO TABS
10.0000 mg | ORAL_TABLET | Freq: Every day | ORAL | Status: DC
  Administered 2020-03-28 – 2020-04-01 (×5): 10 mg via ORAL
  Filled 2020-03-27 (×6): qty 1

## 2020-03-27 MED ORDER — ACETAMINOPHEN 325 MG PO TABS: 650.0000 mg | ORAL_TABLET | Freq: Four times a day (QID) | ORAL | Status: DC | PRN

## 2020-03-27 MED ORDER — LACTATED RINGERS IV SOLN: INTRAVENOUS | Status: AC

## 2020-03-27 MED ORDER — PANTOPRAZOLE SODIUM 40 MG IV SOLR
40.0000 mg | Freq: Once | INTRAVENOUS | Status: AC
  Administered 2020-03-27: 40 mg via INTRAVENOUS
  Filled 2020-03-27: qty 40

## 2020-03-27 MED ORDER — ACETAMINOPHEN 650 MG RE SUPP: 650.0000 mg | Freq: Four times a day (QID) | RECTAL | Status: DC | PRN

## 2020-03-27 NOTE — ED Notes (Signed)
Attempted to call report to 4E. Staff requests this RN call back in 5 minutes.

## 2020-03-27 NOTE — ED Notes (Signed)
ED TO INPATIENT HANDOFF REPORT  Name/Age/Gender Maxwell Fox 75 y.o. male  Code Status    Code Status Orders  (From admission, onward)         Start     Ordered   03/27/20 1627  Full code  Continuous        03/27/20 1626        Code Status History    Date Active Date Inactive Code Status Order ID Comments User Context   03/03/2020 1232 03/06/2020 2028 Full Code [id number]  Karmen Bongo, MD Inpatient   02/26/2020 0509 02/29/2020 2104 Full Code [id number]  Collier Bullock, MD ED   02/14/2020 0632 02/20/2020 1945 Full Code [id number]  Rise Patience, MD ED   01/17/2020 1126 01/22/2020 2102 Full Code [id number]  Jonnie Finner, DO ED   Advance Care Planning Activity      Home/SNF/Other Home  Chief Complaint GI bleed [K92.2]  Level of Care/Admitting Diagnosis ED Disposition    ED Disposition Condition Comment   Admit  Hospital Area: [facility] [[id number]]  Level of Care: Telemetry [5]  Admit to tele based on following criteria: Monitor for Ischemic changes  May admit patient to [facility] or [facility] if equivalent level of care is available:: Yes  Covid Evaluation: Confirmed COVID Negative  Diagnosis: GI bleed [378588]  Admitting Physician: Harold Hedge [[id number]]  Attending Physician: Harold Hedge [[id number]]  Estimated length of stay: past midnight tomorrow  Certification:: I certify this patient will need inpatient services for at least 2 midnights       Medical History Past Medical History:  Diagnosis Date  . B12 nutritional deficiency    malabsorbtion  . BPH (benign prostatic hypertrophy)   . Diverticulosis   . GI bleeding 02/2020  . Hypertension   . Iron deficiency anemia    malabsorption related (s/p gastric bypass)  . Melanoma ([place])   . Obesity    s/p gastric bypass 2000, start 355#  . Pancreatic cyst   . Pulmonary nodules     Allergies No Known Allergies  IV Location/Drains/Wounds Patient Lines/Drains/Airways  Status    Active Line/Drains/Airways    Name Placement date Placement time Site Days   Peripheral IV 03/27/20 Left Forearm 03/27/20  1133  Forearm  less than 1   Peripheral IV 03/27/20 Right Hand 03/27/20  1346  Hand  less than 1          Labs/Imaging Results for orders placed or performed during the hospital encounter of 03/27/20 (from the past 48 hour(s))  Comprehensive metabolic panel     Status: Abnormal   Collection Time: 03/27/20 11:30 AM  Result Value Ref Range   Sodium 141 135 - 145 mmol/L   Potassium 4.0 3.5 - 5.1 mmol/L   Chloride 111 98 - 111 mmol/L   CO2 22 22 - 32 mmol/L   Glucose, Bld 80 70 - 99 mg/dL    Comment: Glucose reference range applies only to samples taken after fasting for at least 8 hours.   BUN 22 8 - 23 mg/dL   Creatinine, Ser 1.00 0.61 - 1.24 mg/dL   Calcium 8.2 (L) 8.9 - 10.3 mg/dL   Total Protein 4.8 (L) 6.5 - 8.1 g/dL   Albumin 2.6 (L) 3.5 - 5.0 g/dL   AST 24 15 - 41 U/L   ALT 14 0 - 44 U/L   Alkaline Phosphatase 62 38 - 126 U/L   Total Bilirubin 0.6 0.3 - 1.2 mg/dL  GFR, Estimated >60 >60 mL/min    Comment: (NOTE) Calculated using the CKD-EPI Creatinine Equation (2021)    Anion gap 8 5 - 15    Comment: Performed at [facility], [place] [address], [place], [place] [address]  CBC     Status: Abnormal   Collection Time: 03/27/20 11:30 AM  Result Value Ref Range   WBC 8.1 4.0 - 10.5 K/uL   RBC 2.61 (L) 4.22 - 5.81 MIL/uL   Hemoglobin 7.9 (L) 13.0 - 17.0 g/dL   HCT 26.4 (L) 39.0 - 52.0 %   MCV 101.1 (H) 80.0 - 100.0 fL   MCH 30.3 26.0 - 34.0 pg   MCHC 29.9 (L) 30.0 - 36.0 g/dL   RDW 15.1 11.5 - 15.5 %   Platelets 207 150 - 400 K/uL   nRBC 0.0 0.0 - 0.2 %    Comment: Performed at [facility], [place] [address] [place], [place] [address]  Type and screen [place]     Status: None (Preliminary result)   Collection Time: 03/27/20 11:30 AM  Result Value Ref Range   ABO/RH(D) A POS     Antibody Screen NEG    Sample Expiration 03/30/2020,2359    Unit Number [id number]    Blood Component Type RED CELLS,LR    Unit division 00    Status of Unit ISSUED    Transfusion Status OK TO TRANSFUSE    Crossmatch Result      Compatible Performed at [facility], [place] [address] [place], [place] [address]   POC occult blood, ED     Status: Abnormal   Collection Time: 03/27/20 11:58 AM  Result Value Ref Range   Fecal Occult Bld POSITIVE (A) NEGATIVE  Prepare RBC (crossmatch)     Status: None   Collection Time: 03/27/20 12:22 PM  Result Value Ref Range   Order Confirmation      ORDER PROCESSED BY BLOOD BANK Performed at [facility] [address], [place], [place] [address]   Resp Panel by RT-PCR (Flu A&B, Covid) Nasopharyngeal Swab     Status: None   Collection Time: 03/27/20  1:40 PM   Specimen: Nasopharyngeal Swab; Nasopharyngeal(NP) swabs in vial transport medium  Result Value Ref Range   SARS Coronavirus 2 by RT PCR NEGATIVE NEGATIVE    Comment: (NOTE) SARS-CoV-2 target nucleic acids are NOT DETECTED.  The SARS-CoV-2 RNA is generally detectable in upper respiratory specimens during the acute phase of infection. The lowest concentration of SARS-CoV-2 viral copies this assay can detect is 138 copies/mL. A negative result does not preclude SARS-Cov-2 infection and should not be used as the sole basis for treatment or other patient management decisions. A negative result may occur with  improper specimen collection/handling, submission of specimen other than nasopharyngeal swab, presence of viral mutation(s) within the areas targeted by this assay, and inadequate number of viral copies(<138 copies/mL). A negative result must be combined with clinical observations, patient history, and epidemiological information. The expected result is Negative.  Fact Sheet for Patients:  EntrepreneurPulse.com.au  Fact Sheet  for Healthcare Providers:  IncredibleEmployment.be  This test is no t yet approved or cleared by the [place] FDA and  has been authorized for detection and/or diagnosis of SARS-CoV-2 by FDA under an Emergency Use Authorization (EUA). This EUA will remain  in effect (meaning this test can be used) for the duration of the COVID-19 declaration under Section 564(b)(1) of the Act, 21  U.S.C.section 360bbb-3(b)(1), unless the authorization is terminated  or revoked sooner.       Influenza A by PCR NEGATIVE NEGATIVE   Influenza B by PCR NEGATIVE NEGATIVE    Comment: (NOTE) The Xpert Xpress SARS-CoV-2/FLU/RSV plus assay is intended as an aid in the diagnosis of influenza from Nasopharyngeal swab specimens and should not be used as a sole basis for treatment. Nasal washings and aspirates are unacceptable for Xpert Xpress SARS-CoV-2/FLU/RSV testing.  Fact Sheet for Patients: EntrepreneurPulse.com.au  Fact Sheet for Healthcare Providers: IncredibleEmployment.be  This test is not yet approved or cleared by the [place] FDA and has been authorized for detection and/or diagnosis of SARS-CoV-2 by FDA under an Emergency Use Authorization (EUA). This EUA will remain in effect (meaning this test can be used) for the duration of the COVID-19 declaration under Section 564(b)(1) of the Act, 21 U.S.C. section 360bbb-3(b)(1), unless the authorization is terminated or revoked.  Performed at [facility], [place] [address] [place], [place] [address]    CT Angio Abd/Pel W and/or Wo Contrast  Result Date: 03/27/2020 CLINICAL DATA:  GI bleed EXAM: CT ANGIOGRAPHY ABDOMEN AND PELVIS WITH CONTRAST AND WITHOUT CONTRAST TECHNIQUE: Multidetector CT imaging of the abdomen and pelvis was performed using the standard protocol during bolus administration of intravenous contrast. Multiplanar reconstructed images and MIPs were obtained  and reviewed to evaluate the vascular anatomy. CONTRAST:  116m OMNIPAQUE IOHEXOL 350 MG/ML SOLN COMPARISON:  None. FINDINGS: VASCULAR Aorta: Infrarenal aorta has calcific atherosclerosis without aneurysm, dissection, rupture or acute occlusive process. Celiac: Atherosclerotic origin but remains patent including its branches. No active bleeding in the celiac vascular territory SMA: Atherosclerotic origin but remains patent including its branches. No active bleeding in the SMA vascular territory. Renals: Both main renal arteries appear widely patent. No accessory renal artery. IMA: Atherosclerotic origin but remains patent including its branches. No active bleeding in the IMA vascular territory. Inflow: Atherosclerosis of the iliac vessels without inflow disease or occlusion. Common, internal, and external iliac arteries are patent. Proximal Outflow: Common femoral, proximal profunda femoral, and proximal superficial femoral arteries demonstrated appear patent. Veins: No veno-occlusive process. Review of the MIP images confirms the above findings. NON-VASCULAR Lower chest: Small non loculated pleural effusions. Associated dependent bibasilar atelectasis. Normal heart size. Mitral valve annulus calcifications noted. No pericardial effusion. Small hiatal hernia noted. Hepatobiliary: Remote cholecystectomy. Mild biliary prominence, suspect post cholecystectomy related. No other significant focal hepatic abnormality. Pancreas: Hypodense cystic lesion in the pancreas head measures 1.8 cm. Similar indeterminate hypodense cystic area in the pancreas body measures 1.5 cm. No associated ductal dilatation or pancreatic atrophy. These cystic lesions are most compatible with chronic pseudo cyst, branch duct IMPN, or small cyst adenomas. Recommend follow-up MRI in 3-6 months. Spleen: Normal in size without focal abnormality. Adrenals/Urinary Tract: No adrenal abnormality. Small exophytic hypodense renal cyst in the left kidney  upper pole measures 1.3 cm. No renal obstruction or hydronephrosis. No hydroureter or ureteral calculus. Bladder unremarkable. Stomach/Bowel: Postop changes from gastric bypass. Small bowel anastomosis noted in the left abdomen. No significant dilatation, ileus, obstruction pattern, or free air. Normal appendix demonstrated. Distal colon diverticulosis noted without acute inflammation. No free fluid, fluid collection, hemorrhage, hematoma, abscess, or ascites. Lymphatic: No bulky adenopathy. Reproductive: No significant finding by CT. Other: No abdominal wall hernia or abnormality. No abdominopelvic ascites. Musculoskeletal: Degenerative changes noted of the spine. Bones are osteopenic. Healing left rami fractures noted. Bilateral L5 pars defects with chronic anterolisthesis of L5 upon S1  and associated degenerative disc disease. IMPRESSION: VASCULAR No evidence of active arterial GI bleeding by CTA. Abdominal atherosclerosis as detailed above. No other acute intra-abdominopelvic vascular finding. NON-VASCULAR Indeterminate pancreatic head and body cystic lesions as detailed above. Recommend follow-up nonemergent MRI in 3-6 months for further characterization and to determine stability. Small layering pleural effusions and basilar atelectasis Postop changes of gastric bypass and cholecystectomy. Diverticulosis without acute inflammatory process. Healing left rami fractures Electronically Signed   By: Jerilynn Mages.  Shick M.D.   On: 03/27/2020 16:02    Pending Labs Unresulted Labs (From admission, onward)          Start     Ordered   03/28/20 6378  Basic metabolic panel  Daily,   R     Question:  Specimen collection method  Answer:  Lab=Lab collect   03/27/20 1626   03/28/20 0500  CBC  Daily,   R     Question:  Specimen collection method  Answer:  Lab=Lab collect   03/27/20 1626   03/27/20 1800  Hemoglobin and hematocrit, blood  Now then every 6 hours,   R (with STAT occurrences)     Question:  Specimen  collection method  Answer:  Lab=Lab collect   03/27/20 1626          Vitals/Pain Today's Vitals   03/27/20 1500 03/27/20 1600 03/27/20 1605 03/27/20 1630  BP:  115/66 113/67 114/68  Pulse: 67 63 64 84  Resp: 14 12 16 20   Temp:    97.8 F (36.6 C)  TempSrc:    Oral  SpO2: 100% 96% 100% 100%    Isolation Precautions Airborne and Contact precautions  Medications Medications  0.9 %  sodium chloride infusion (0 mL/hr Intravenous Hold 03/27/20 1611)  HYDROmorphone (DILAUDID) injection 1 mg (has no administration in time range)  pantoprazole (PROTONIX) injection 40 mg (has no administration in time range)  sodium chloride flush (NS) 0.9 % injection 3 mL (has no administration in time range)  acetaminophen (TYLENOL) tablet 650 mg (has no administration in time range)    Or  acetaminophen (TYLENOL) suppository 650 mg (has no administration in time range)  HYDROcodone-acetaminophen (NORCO/VICODIN) 5-325 MG per tablet 1-2 tablet (has no administration in time range)  lactated ringers infusion (has no administration in time range)  clonazePAM (KLONOPIN) tablet 1 mg (has no administration in time range)  escitalopram (LEXAPRO) tablet 10 mg (has no administration in time range)  levothyroxine (SYNTHROID) tablet 88 mcg (has no administration in time range)  Oxycodone HCl TABS 10 mg (has no administration in time range)  predniSONE (DELTASONE) tablet 5 mg (has no administration in time range)  peg 3350 powder (MOVIPREP) kit 100 g (has no administration in time range)  pantoprazole (PROTONIX) injection 40 mg (40 mg Intravenous Given 03/27/20 1204)  iohexol (OMNIPAQUE) 350 MG/ML injection 100 mL (100 mLs Intravenous Contrast Given 03/27/20 1436)  fentaNYL (SUBLIMAZE) injection 50 mcg (50 mcg Intravenous Given 03/27/20 1359)    Mobility walks with device

## 2020-03-27 NOTE — Consult Note (Addendum)
Consultation  Referring Provider: ER MD/ Regenia Skeeter Primary Care Physician:  [name], MD Primary Gastroenterologist:  Dr.Pyrtle  Reason for Consultation: Acute GI bleed with hypotension  HPI: Maxwell Fox is a 75 y.o. male, known to Dr. Hilarie Fredrickson, who presents to the emergency room today with acute onset of bright red blood per rectum this morning.  On arrival to the emergency room he was lightheaded and had a presyncopal episode at home.  He was hypotensive. Last hemoglobin 03/12/1998 2210.6 with hematocrit of 32.  Today hemoglobin down to 7.9. 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  He has a metastatic melanoma to the brain. He also had a recent pubic fracture. Patient had admission 1/24 through 02/29/2020 with melena.  He had EGD on 02/26/2020 which showed an anastomotic ulcer with visible vessel which was treated with epinephrine for hemostasis. He was discharged home and then readmitted on 03/03/2020 with hematochezia.  He had repeat EGD per Dr. Benson Norway on 03/03/2020 with finding of an 8 mm anastomotic ulcer with no stigmata of recent bleeding.  Subsequent colonoscopy on 03/05/2020 revealed multiple small and large diverticuli in the sigmoid and left colon, and grade 2 internal hemorrhoids. At time of discharge hemoglobin was 8.3. Patient is not anticoagulated.  He has not been taking any aspirin or NSAIDs.  He has been on twice daily PPI.  Patient says he had been feeling well over the past couple of weeks, eating well, no complaints of abdominal discomfort and his daughter feels that he has been regaining strength. Patient says that he did notice some dark stool over the last 2 days, then this morning had a bowel movement with a lot of bright red blood noted in the commode x1.  He  became dizzy and lightheaded when trying to stand up from the commode. He has not had any further bowel movements since arrival to the  emergency room.  After fluid bolus blood pressure 110/70.  Regarding metastatic melanoma, he is followed at [facility], he is 4 years out from resection, and immunotherapy, and is having surveillance every 6 months.  He had CT of the abdomen pelvis in August 2021 which was negative for any evidence of metastatic disease.   Past Medical History:  Diagnosis Date  . B12 nutritional deficiency    malabsorbtion  . BPH (benign prostatic hypertrophy)   . Diverticulosis   . GI bleeding 02/2020  . Hypertension   . Iron deficiency anemia    malabsorption related (s/p gastric bypass)  . Melanoma ([place])   . Obesity    s/p gastric bypass 2000, start 355#  . Pancreatic cyst   . Pulmonary nodules     Past Surgical History:  Procedure Laterality Date  . COLONOSCOPY WITH PROPOFOL N/A 03/05/2020   Procedure: COLONOSCOPY WITH PROPOFOL;  Surgeon: Rush Landmark Telford Nab., MD;  Location: [place];  Service: Gastroenterology;  Laterality: N/A;  . CRANIOTOMY FOR TUMOR  12/11/2015   metastatic melanoma  . ESOPHAGOGASTRODUODENOSCOPY (EGD) WITH PROPOFOL N/A 02/14/2020   Procedure: ESOPHAGOGASTRODUODENOSCOPY (EGD) WITH PROPOFOL;  Surgeon: Lavena Bullion, DO;  Location: [facility];  Service: Endoscopy;  Laterality: N/A;  . ESOPHAGOGASTRODUODENOSCOPY (EGD) WITH PROPOFOL N/A 02/26/2020   Procedure: ESOPHAGOGASTRODUODENOSCOPY (EGD) WITH PROPOFOL;  Surgeon: Irene Shipper, MD;  Location: [facility] ENDOSCOPY;  Service: Endoscopy;  Laterality: N/A;  . ESOPHAGOGASTRODUODENOSCOPY (EGD) WITH PROPOFOL N/A 03/03/2020   Procedure: ESOPHAGOGASTRODUODENOSCOPY (EGD) WITH PROPOFOL;  Surgeon: Carol Ada, MD;  Location: [facility] END[facility];  Service: Endoscopy;  Laterality: N/A;  . HEMOSTASIS CONTROL  02/26/2020   Procedure: HEMOSTASIS CONTROL;  Surgeon: Irene Shipper, MD;  Location: [place];  Service: Endoscopy;;  . HOT HEMOSTASIS N/A 02/26/2020   Procedure: HOT HEMOSTASIS (ARGON PLASMA COAGULATION/BICAP);  Surgeon: Irene Shipper, MD;   Location: [facility] ENDOSCOPY;  Service: Endoscopy;  Laterality: N/A;  . MOHS SURGERY  2012   melanoma, mid back, [place]  . ROUX-EN-Y PROCEDURE  2000  . TOTAL SHOULDER REPLACEMENT Right 2011   [place]    Prior to Admission medications   Medication Sig Start Date End Date Taking? Authorizing Provider  calcium carbonate (OS-CAL - DOSED IN MG OF ELEMENTAL CALCIUM) 1250 (500 Ca) MG tablet Take 1 tablet by mouth daily with supper.    [provider]  Cholecalciferol (VITAMIN D) 2000 UNITS tablet Take 1 tablet (2,000 Units total) by mouth daily. Patient taking differently: Take 2,000 Units by mouth daily with supper. 10/26/13   [name], MD  clonazePAM (KLONOPIN) 1 MG tablet Take 1 tablet (1 mg total) by mouth daily as needed. Patient taking differently: Take 1 mg by mouth daily. 01/22/20   Oswald Hillock, MD  escitalopram (LEXAPRO) 10 MG tablet TAKE 1 TABLET(10 MG) BY MOUTH DAILY Patient taking differently: Take 10 mg by mouth daily with supper. 11/23/19   [name], MD  hydrocortisone (ANUSOL-HC) 25 MG suppository 1 suppository every evening for 7 days,1 suppository every other evening 03/06/20   Barb Merino, MD  levothyroxine (SYNTHROID) 88 MCG tablet Take 1 tablet (88 mcg total) by mouth daily before breakfast. 06/26/19   [name], MD  Multiple Vitamin (MULTIVITAMIN WITH MINERALS) TABS tablet Take 1 tablet by mouth daily with supper.    [provider]  omeprazole (PRILOSEC) 40 MG capsule Take 1 capsule (40 mg total) by mouth 2 (two) times daily. 03/12/20   Esterwood, Amy S, PA-C  Oxycodone HCl 10 MG TABS Take 1 tablet (10 mg total) by mouth every 6 (six) hours as needed (chronic pain - pelvic fx). 03/26/20   [name], MD  polyethylene glycol (MIRALAX / GLYCOLAX) 17 g packet Take 17 g by mouth daily as needed for mild constipation or moderate constipation. 02/20/20   Pokhrel, Corrie Mckusick, MD  predniSONE (DELTASONE) 5 MG tablet Take 1 tablet (5 mg total) by  mouth daily with breakfast. 06/26/19   [name], MD  sucralfate (CARAFATE) 1 GM/10ML suspension Take 10 mLs (1 g total) by mouth 4 (four) times daily -  with meals and at bedtime for 14 days. 02/29/20 03/14/20  Shelly Coss, MD  tamsulosin (FLOMAX) 0.4 MG CAPS capsule TAKE 1 CAPSULE(0.4 MG) BY MOUTH DAILY AFTER SUPPER Patient taking differently: Take 0.4 mg by mouth daily after supper. 11/30/19   [name], MD    Current Facility-Administered Medications  Medication Dose Route Frequency Provider Last Rate Last Admin  . 0.9 %  sodium chloride infusion  10 mL/hr Intravenous Once Sherwood Gambler, MD       Current Outpatient Medications  Medication Sig Dispense Refill  . calcium carbonate (OS-CAL - DOSED IN MG OF ELEMENTAL CALCIUM) 1250 (500 Ca) MG tablet Take 1 tablet by mouth daily with supper.    . Cholecalciferol (VITAMIN D) 2000 UNITS tablet Take 1 tablet (2,000 Units total) by mouth daily. (Patient taking differently: Take 2,000 Units by mouth daily with supper.) 30 tablet 11  . clonazePAM (KLONOPIN) 1 MG tablet Take 1 tablet (1 mg  total) by mouth daily as needed. (Patient taking differently: Take 1 mg by mouth daily.) 5 tablet 0  . escitalopram (LEXAPRO) 10 MG tablet TAKE 1 TABLET(10 MG) BY MOUTH DAILY (Patient taking differently: Take 10 mg by mouth daily with supper.) 90 tablet 0  . hydrocortisone (ANUSOL-HC) 25 MG suppository 1 suppository every evening for 7 days,1 suppository every other evening 24 suppository 0  . levothyroxine (SYNTHROID) 88 MCG tablet Take 1 tablet (88 mcg total) by mouth daily before breakfast. 90 tablet 3  . Multiple Vitamin (MULTIVITAMIN WITH MINERALS) TABS tablet Take 1 tablet by mouth daily with supper.    Marland Kitchen omeprazole (PRILOSEC) 40 MG capsule Take 1 capsule (40 mg total) by mouth 2 (two) times daily. 60 capsule 6  . Oxycodone HCl 10 MG TABS Take 1 tablet (10 mg total) by mouth every 6 (six) hours as needed (chronic pain - pelvic fx). 120 tablet 0   . polyethylene glycol (MIRALAX / GLYCOLAX) 17 g packet Take 17 g by mouth daily as needed for mild constipation or moderate constipation. 14 each 0  . predniSONE (DELTASONE) 5 MG tablet Take 1 tablet (5 mg total) by mouth daily with breakfast. 120 tablet 3  . sucralfate (CARAFATE) 1 GM/10ML suspension Take 10 mLs (1 g total) by mouth 4 (four) times daily -  with meals and at bedtime for 14 days. 560 mL 0  . tamsulosin (FLOMAX) 0.4 MG CAPS capsule TAKE 1 CAPSULE(0.4 MG) BY MOUTH DAILY AFTER SUPPER (Patient taking differently: Take 0.4 mg by mouth daily after supper.) 90 capsule 1    Allergies as of 03/27/2020  . (No Known Allergies)    Family History  Problem Relation Age of Onset  . Hypertension Father   . Macular degeneration Mother   . Melanoma Mother   . Melanoma Sister   . Breast cancer Sister   . Uterine cancer Sister   . Stomach cancer Sister   . Melanoma Brother     Social History   Socioeconomic History  . Marital status: Married    Spouse name: Not on file  . Number of children: 2  . Years of education: Not on file  . Highest education level: Not on file  Occupational History  . Occupation: general contractor-retired  Tobacco Use  . Smoking status: Former Smoker    Quit date: 02/03/1963    Years since quitting: 85.1  . Smokeless tobacco: Never Used  Vaping Use  . Vaping Use: Never used  Substance and Sexual Activity  . Alcohol use: Not Currently    Comment: wine 2x/week  . Drug use: Never  . Sexual activity: Not on file  Other Topics Concern  . 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 Worried About Charity fundraiser in the Last Year: Never true  . Ran Out of Food in the Last Year: Never true  Transportation Needs: No Transportation Needs  . Lack of  Transportation (Medical): No  . Lack of Transportation (Non-Medical): No  Physical Activity: Not on file  Stress: Not on file  Social Connections: Not on file  Intimate Partner Violence: Not on file    Review of Systems: Pertinent positive and negative review of systems were noted in the above HPI section.  All other review of systems was otherwise negative.  Physical Exam: Vital signs in last 24 hours: Temp:  [98 F (36.7 C)-98.4 F (36.9 C)] 98 F (36.7 C) (02/23 1126) Pulse Rate:  [67-92] 70 (02/23 1145) Resp:  [15-23] 15 (02/23 1145) BP: (74-108)/(59-65) 100/60 (02/23 1145) SpO2:  [94 %-100 %] 100 % (02/23 1145)   General:   Alert,  Well-developed, older white male pleasant and cooperative in NAD.  Pale, accompanied by his daughter.  Blood pressure 110/70, pulse in the 80s Head:  Normocephalic and atraumatic. Eyes:  Sclera clear, no icterus.   Conjunctiva pale. Ears:  Normal auditory acuity. Nose:  No deformity, discharge,  or lesions. Mouth:  No deformity or lesions.   Neck:  Supple; no masses or thyromegaly. Lungs:   Clear throughout to auscultation.   No wheezes, crackles, or rhonchi. Heart:  Regular rate and rhythm; no murmurs, clicks, rubs,  or gallops. Abdomen:  Soft,nontender, BS active,nonpalp mass or hsm, midline incisional scar, Rectal: Not done Msk:  Symmetrical without gross deformities. . Pulses:  Normal pulses noted. Extremities:  Without clubbing or edema. Neurologic:  Alert and  oriented x4;  grossly normal neurologically. Skin:  Intact without significant lesions or rashes.. Psych:  Alert and cooperative. Normal mood and affect.  Intake/Output from previous day: No intake/output data recorded. Intake/Output this shift: No intake/output data recorded.  Lab Results: Recent Labs    03/27/20 1130  WBC 8.1  HGB 7.9*  HCT 26.4*  PLT 207   BMET Recent Labs    03/27/20 1130  NA 141  K 4.0  CL 111  CO2 22  GLUCOSE 80  BUN 22  CREATININE 1.00   CALCIUM 8.2*   LFT Recent Labs    03/27/20 1130  PROT 4.8*  ALBUMIN 2.6*  AST 24  ALT 14  ALKPHOS 62  BILITOT 0.6   PT/INR No results for input(s): LABPROT, INR in the last 72 hours. Hepatitis Panel No results for input(s): HEPBSAG, HCVAB, HEPAIGM, HEPBIGM in the last 72 hours.   IMPRESSION:  #45 75 year old white male who presents with acute GI bleeding, with onset of bright red blood per rectum this a.m. associated with orthostasis, presyncope.  Patient was hypotensive on arrival to the emergency room.  He has responded to fluid bolus and has not had any further obvious GI bleeding since presentation. In retrospect says his stools have been darker over the past 2 days. Hemoglobin dropped from 10.6 on 03/12/2020 to -7.9 this a.m.  Suspect acute lower GI bleeding, likely diverticular with multiple left-sided diverticuli documented on recent colonoscopy 03/05/2020. He also has internal hemorrhoids.  Patient with recent admission for acute upper GI bleed secondary to anastomotic ulceration with visible vessel in patient status post remote Roux-en-Y gastrojejunostomy. Patient had repeat EGD on 03/03/2020 that showed a clean-based 8 mm anastomotic ulcer. Is possible that he has reblood from the ulcer but less likely  #2 acute on chronic anemia #3 history of metastatic melanoma with metastatic lesion to the brain diagnosed in 2017, followed at [facility] he is status post resection and immunotherapy and is now on every 6 month surveillance. Last abdominal imaging 09/2019 with no evidence of intra-abdominal disease.  #4 recent pubic fracture #5 history of B12 and iron deficiency  Plan; n.p.o. this afternoon. Stat CT angio abdomen pelvis if positive for bleeding site proceed to IR embolization We will place him on twice daily PPI/IV Serial hemoglobins every 6 hours and transfuse to keep hemoglobin above 8.  He will receive 2 units of packed RBCs this afternoon If CT angio unrevealing, will  consider repeat EGD. Thank you will follow with you  [name] PA-C 03/27/2020, 12:27 PM   GI ATTENDING  History, laboratories, x-rays, prior endoscopy reports reviewed.  Patient is known to me from prior endoscopy.  Seen and examined.  Agree with comprehensive consultation note as outlined above.  Patient has had several clinically significant GI bleeds.  Though significant marginal ulceration at the gastroenteric anastomosis, no active bleeding at any time.  He did have stigmata on one endoscopy which was treated.  However, overall progressive endoscopies have shown significant progressive improvement in the upper GI ulceration.  Last hospitalization underwent colonoscopy which revealed diverticulosis.  Now with significant recurrent bleeding.  BUN normal.  Question vascular lesion between the colon and limited evaluated upper gut.  Has responded to initial resuscitation.  Agree with transfusion.  Agree with plans for urgent CTA (completing now).  With a history of metastatic melanoma, metastatic disease to the small bowel is a consideration.  If vascular lesion identified, would ask IR to consider embolization.  If not, consider relook EGD (once again).  Could also consider tagged RBC scan if recurrent acute bleeding of obscure origin.  Docia Chuck. Geri Seminole., M.D. [facility] Division of Gastroenterology

## 2020-03-27 NOTE — Plan of Care (Signed)
  Problem: Safety: Goal: Ability to remain free from injury will improve Outcome: Progressing   Problem: Pain Managment: Goal: General experience of comfort will improve Outcome: Progressing   Problem: Education: Goal: Knowledge of General Education information will improve Description: Including pain rating scale, medication(s)/side effects and non-pharmacologic comfort measures Outcome: Progressing

## 2020-03-27 NOTE — ED Notes (Signed)
Patient said he is unable to stand. He said he is still dizzy and feels too weak. Unable to complete all of the orthostatic vitals.

## 2020-03-27 NOTE — ED Provider Notes (Signed)
[facility] DEPT Provider Note   CSN: [id number] Arrival date & time: 03/27/20  1109     History Chief Complaint  Patient presents with  . Blood In Stools  . Hypotension    Maxwell Fox is a 75 y.o. male.  HPI 75 year old male presents with recurrent rectal bleeding.  The patient has had multiple admissions for transfusions and GI work-up.  Was doing well up until this morning when he had blood per rectum.  He thinks maybe he has had some dark stools but not quite black over the last couple days.  No abdominal pain.  No chest pain/breath.  Today he nearly passed out when he stood up and was lightheaded.  No rectal pain.   Past Medical History:  Diagnosis Date  . B12 nutritional deficiency    malabsorbtion  . BPH (benign prostatic hypertrophy)   . Diverticulosis   . GI bleeding 02/2020  . Hypertension   . Iron deficiency anemia    malabsorption related (s/p gastric bypass)  . Melanoma ([place])   . Obesity    s/p gastric bypass 2000, start 355#  . Pancreatic cyst   . Pulmonary nodules     Patient Active Problem List   Diagnosis Date Noted  . History of Roux-en-Y gastric bypass   . Hemorrhagic shock ([place])   . Acute gastric ulcer with hemorrhage   . Acute GI bleeding 02/14/2020  . Acute blood loss anemia 02/14/2020  . GI bleed 02/14/2020  . Melena   . Marginal ulcers   . Gastroesophageal reflux disease with esophagitis without hemorrhage   . Closed fracture of left superior pubic ramus ([place]) 02/09/2020  . Anxiety and depression 01/10/2018  . History of antineoplastic chemotherapy 07/19/2017  . Chronic midline low back pain without sciatica 04/19/2017  . Adrenal insufficiency due to cancer therapy ([place]) 07/27/2016  . Secondary hypothyroidism 07/27/2016  . H/O gastric bypass 01/06/2016  . Pancreatic cyst 01/06/2016  . Melanoma metastatic to brain ([place]) 12/31/2015  . Iron deficiency anemia   . B12 nutritional deficiency   . Benign  prostatic hyperplasia     Past Surgical History:  Procedure Laterality Date  . COLONOSCOPY WITH PROPOFOL N/A 03/05/2020   Procedure: COLONOSCOPY WITH PROPOFOL;  Surgeon: Rush Landmark Telford Nab., MD;  Location: [facility];  Service: Gastroenterology;  Laterality: N/A;  . CRANIOTOMY FOR TUMOR  12/11/2015   metastatic melanoma  . ESOPHAGOGASTRODUODENOSCOPY (EGD) WITH PROPOFOL N/A 02/14/2020   Procedure: ESOPHAGOGASTRODUODENOSCOPY (EGD) WITH PROPOFOL;  Surgeon: Lavena Bullion, DO;  Location: [facility] ENDOSCOPY;  Service: Endoscopy;  Laterality: N/A;  . ESOPHAGOGASTRODUODENOSCOPY (EGD) WITH PROPOFOL N/A 02/26/2020   Procedure: ESOPHAGOGASTRODUODENOSCOPY (EGD) WITH PROPOFOL;  Surgeon: Irene Shipper, MD;  Location: [facility] ENDOSCOPY;  Service: Endoscopy;  Laterality: N/A;  . ESOPHAGOGASTRODUODENOSCOPY (EGD) WITH PROPOFOL N/A 03/03/2020   Procedure: ESOPHAGOGASTRODUODENOSCOPY (EGD) WITH PROPOFOL;  Surgeon: Carol Ada, MD;  Location: [facility];  Service: Endoscopy;  Laterality: N/A;  . HEMOSTASIS CONTROL  02/26/2020   Procedure: HEMOSTASIS CONTROL;  Surgeon: Irene Shipper, MD;  Location: [facility];  Service: Endoscopy;;  . HOT HEMOSTASIS N/A 02/26/2020   Procedure: HOT HEMOSTASIS (ARGON PLASMA COAGULATION/BICAP);  Surgeon: Irene Shipper, MD;  Location: [facility] ENDOSCOPY;  Service: Endoscopy;  Laterality: N/A;  . MOHS SURGERY  2012   melanoma, mid back, [place]  . ROUX-EN-Y PROCEDURE  2000  . TOTAL SHOULDER REPLACEMENT Right 2011   [place]       Family History  Problem Relation Age of  Onset  . Hypertension Father   . Macular degeneration Mother   . Melanoma Mother   . Melanoma Sister   . Breast cancer Sister   . Uterine cancer Sister   . Stomach cancer Sister   . Melanoma Brother     Social History   Tobacco Use  . Smoking status: Former Smoker    Quit date: 02/03/1963    Years since quitting: 21.1  . Smokeless tobacco: Never Used  Vaping Use  . Vaping Use: Never used  Substance Use  Topics  . Alcohol use: Not Currently    Comment: wine 2x/week  . Drug use: Never    Home Medications Prior to Admission medications   Medication Sig Start Date End Date Taking? Authorizing Provider  calcium carbonate (OS-CAL - DOSED IN MG OF ELEMENTAL CALCIUM) 1250 (500 Ca) MG tablet Take 1 tablet by mouth daily with supper.   Yes [provider]  Cholecalciferol (VITAMIN D) 2000 UNITS tablet Take 1 tablet (2,000 Units total) by mouth daily. 10/26/13  Yes [name], MD  clonazePAM (KLONOPIN) 1 MG tablet Take 1 tablet (1 mg total) by mouth daily as needed. Patient taking differently: Take 1 mg by mouth daily. 01/22/20  Yes [name], [name], MD  escitalopram (LEXAPRO) 10 MG tablet TAKE 1 TABLET(10 MG) BY MOUTH DAILY Patient taking differently: Take 10 mg by mouth daily. 11/23/19  Yes Burns, [name], MD  hydrocortisone (ANUSOL-HC) 25 MG suppository 1 suppository every evening for 7 days,1 suppository every other evening Patient taking differently: Place 25 mg rectally daily. 03/06/20  Yes Barb Merino, MD  levothyroxine (SYNTHROID) 88 MCG tablet Take 1 tablet (88 mcg total) by mouth daily before breakfast. 06/26/19  Yes [name], MD  Multiple Vitamin (MULTIVITAMIN WITH MINERALS) TABS tablet Take 1 tablet by mouth daily.   Yes [provider]  omeprazole (PRILOSEC) 40 MG capsule Take 1 capsule (40 mg total) by mouth 2 (two) times daily. 03/12/20  Yes Esterwood, Amy S, PA-C  Oxycodone HCl 10 MG TABS Take 1 tablet (10 mg total) by mouth every 6 (six) hours as needed (chronic pain - pelvic fx). 03/26/20  Yes Burns, [name], MD  predniSONE (DELTASONE) 5 MG tablet Take 1 tablet (5 mg total) by mouth daily with breakfast. 06/26/19  Yes [name], MD  sucralfate (CARAFATE) 1 GM/10ML suspension Take 10 mLs (1 g total) by mouth 4 (four) times daily -  with meals and at bedtime for 14 days. 02/29/20 03/14/20 Yes Shelly Coss, MD  tamsulosin (FLOMAX) 0.4 MG CAPS capsule  TAKE 1 CAPSULE(0.4 MG) BY MOUTH DAILY AFTER SUPPER Patient taking differently: Take 0.4 mg by mouth daily. 11/30/19  Yes Burns, [name], MD  polyethylene glycol (MIRALAX / GLYCOLAX) 17 g packet Take 17 g by mouth daily as needed for mild constipation or moderate constipation. Patient not taking: No sig reported 02/20/20   Flora Lipps, MD    Allergies    Patient has no known allergies.  Review of Systems   Review of Systems  Respiratory: Negative for shortness of breath.   Cardiovascular: Negative for chest pain.  Gastrointestinal: Positive for blood in stool. Negative for abdominal pain.  Neurological: Positive for weakness and light-headedness. Negative for syncope.  All other systems reviewed and are negative.   Physical Exam Updated Vital Signs BP 111/65   Pulse 67   Temp 98.4 F (36.9 C) (Oral)   Resp 14   SpO2 100%   Physical Exam Vitals and  nursing note reviewed.  Constitutional:      Appearance: He is well-developed and well-nourished.  HENT:     Head: Normocephalic and atraumatic.     Right Ear: External ear normal.     Left Ear: External ear normal.     Nose: Nose normal.  Eyes:     General:        Right eye: No discharge.        Left eye: No discharge.  Cardiovascular:     Rate and Rhythm: Normal rate and regular rhythm.     Heart sounds: Normal heart sounds.  Pulmonary:     Effort: Pulmonary effort is normal.     Breath sounds: Normal breath sounds.  Abdominal:     Palpations: Abdomen is soft.     Tenderness: There is no abdominal tenderness.  Genitourinary:    Comments: Rectal exam shows no obvious external hemorrhoids. He has orange-reddish liquid on DRE Musculoskeletal:        General: No edema.     Cervical back: Neck supple.  Skin:    General: Skin is warm and dry.     Coloration: Skin is pale.  Neurological:     Mental Status: He is alert.  Psychiatric:        Mood and Affect: Mood is not anxious.     ED Results / Procedures /  Treatments   Labs (all labs ordered are listed, but only abnormal results are displayed) Labs Reviewed  COMPREHENSIVE METABOLIC PANEL - Abnormal; Notable for the following components:      Result Value   Calcium 8.2 (*)    Total Protein 4.8 (*)    Albumin 2.6 (*)    All other components within normal limits  CBC - Abnormal; Notable for the following components:   RBC 2.61 (*)    Hemoglobin 7.9 (*)    HCT 26.4 (*)    MCV 101.1 (*)    MCHC 29.9 (*)    All other components within normal limits  POC OCCULT BLOOD, ED - Abnormal; Notable for the following components:   Fecal Occult Bld POSITIVE (*)    All other components within normal limits  RESP PANEL BY RT-PCR (FLU A&B, COVID) ARPGX2  TYPE AND SCREEN  PREPARE RBC (CROSSMATCH)    EKG None  Radiology No results found.  Procedures .Critical Care Performed by: Sherwood Gambler, MD Authorized by: Sherwood Gambler, MD   Critical care provider statement:    Critical care time (minutes):  40   Critical care time was exclusive of:  Separately billable procedures and treating other patients   Critical care was necessary to treat or prevent imminent or life-threatening deterioration of the following conditions:  Circulatory failure and shock   Critical care was time spent personally by me on the following activities:  Discussions with consultants, evaluation of patient's response to treatment, examination of patient, ordering and performing treatments and interventions, ordering and review of laboratory studies, ordering and review of radiographic studies, pulse oximetry, re-evaluation of patient's condition, obtaining history from patient or surrogate and review of old charts     Medications Ordered in ED Medications  0.9 %  sodium chloride infusion (has no administration in time range)  pantoprazole (PROTONIX) injection 40 mg (40 mg Intravenous Given 03/27/20 1204)  iohexol (OMNIPAQUE) 350 MG/ML injection 100 mL (100 mLs Intravenous  Contrast Given 03/27/20 1436)  fentaNYL (SUBLIMAZE) injection 50 mcg (50 mcg Intravenous Given 03/27/20 1359)    ED Course  I  have reviewed the triage vital signs and the nursing notes.  Pertinent labs & imaging results that were available during my care of the patient were reviewed by me and considered in my medical decision making (see chart for details).    MDM Rules/Calculators/A&P                          Patient presents with acute GI bleeding.  His blood pressure was quite low on arrival and fluids have been started prior to me seeing him.  He is feeling a little better but clearly looks paler than normal and there is high concern for significant GI bleed.  He was given 1 unit of blood.  His hemoglobin is a couple points lower than when he was discharged a few weeks ago.  Discussed with gastroenterology, they recommend CT angiography to look for acute bleeding source, which is currently pending.  Otherwise he appears to have stabilized from a vital sign standpoint.  He was given fentanyl for chronic pain issues.  He will be admitted to the hospitalist service, Dr. Neysa Bonito to admit Final Clinical Impression(s) / ED Diagnoses Final diagnoses:  Acute GI bleeding  Symptomatic anemia    Rx / DC Orders ED Discharge Orders    None       Sherwood Gambler, MD 03/27/20 1536

## 2020-03-27 NOTE — Telephone Encounter (Signed)
Just a FYI...  Deloria Lair called back to inform Mr. Delong decided to go to the ED now.

## 2020-03-27 NOTE — ED Notes (Addendum)
Per PCP-Coming from home, states patient had blood in his stool-history of GI bleeds-coming in for eval

## 2020-03-27 NOTE — H&P (Signed)
History and Physical        Hospital Admission Note Date: 03/27/2020  Patient name: Maxwell Fox Medical record number: [id number] Date of birth: Dec 16, 1945 Age: 75 y.o. Gender: male  PCP: [name], MD   Chief Complaint    Chief Complaint  Patient presents with  . Blood In Stools  . Hypotension      HPI:   This is a 75 year old male with past medical history of recurrent GI bleed and multiple admissions for transfusions and history of hemorrhagic shock, obesity s/p Roux-en-Y, anxiety and depression, left superior pubic ramus fracture on chronic opiates, metastatic melanoma to brain s/p resection and immunotherapy with acquired central hypothyroidism and central adrenal insufficiency, iron deficiency anemia who presented to the ED with recurrent rectal bleeding for the past couple days.  Believes he is had some dark stools but not quite black over the past couple days.  No chest pain, shortness of breath.  Did have a presyncopal episode this morning when he stood up and was lightheaded which prompted him to come to the ED.   ED Course: Afebrile, hypotensive on arrival which improved with 1 unit PRBCs. Notable Labs: WBC 8.1, Hb 7.9 (10.6 on 2/8), platelets 207, FOBT positive, COVID-19 and flu negative. Notable Imaging: CTA abdomen pelvis-no evidence of active arterial GI bleed, indeterminate pancreatic head and body cystic lesions, small layering pleural effusions and basilar atelectasis. Patient received Protonix, fentanyl.  GI was consulted in the ED.   Vitals:   03/27/20 1605 03/27/20 1630  BP: 113/67 114/68  Pulse: 64 84  Resp: 16 20  Temp:  97.8 F (36.6 C)  SpO2: 100% 100%     Review of Systems:  Review of Systems  All other systems reviewed and are negative.   Medical/Social/Family History   Past Medical History: Past Medical History:  Diagnosis Date   . B12 nutritional deficiency    malabsorbtion  . BPH (benign prostatic hypertrophy)   . Diverticulosis   . GI bleeding 02/2020  . Hypertension   . Iron deficiency anemia    malabsorption related (s/p gastric bypass)  . Melanoma ([place])   . Obesity    s/p gastric bypass 2000, start 355#  . Pancreatic cyst   . Pulmonary nodules     Past Surgical History:  Procedure Laterality Date  . COLONOSCOPY WITH PROPOFOL N/A 03/05/2020   Procedure: COLONOSCOPY WITH PROPOFOL;  Surgeon: Rush Landmark Telford Nab., MD;  Location: [place];  Service: Gastroenterology;  Laterality: N/A;  . CRANIOTOMY FOR TUMOR  12/11/2015   metastatic melanoma  . ESOPHAGOGASTRODUODENOSCOPY (EGD) WITH PROPOFOL N/A 02/14/2020   Procedure: ESOPHAGOGASTRODUODENOSCOPY (EGD) WITH PROPOFOL;  Surgeon: Lavena Bullion, DO;  Location: [place] [facility];  Service: Endoscopy;  Laterality: N/A;  . ESOPHAGOGASTRODUODENOSCOPY (EGD) WITH PROPOFOL N/A 02/26/2020   Procedure: ESOPHAGOGASTRODUODENOSCOPY (EGD) WITH PROPOFOL;  Surgeon: Irene Shipper, MD;  Location[facility] END[facility]COPY;  Service: Endoscopy;  Laterality: N/A;  . ESOPHAGOGASTRODUODENOSCOPY (EGD) WITH PROPOFOL N/A 03/03/2020   Procedure: ESOPHAGOGASTRODUODENOSCOPY (EGD) WITH PROPOFOL;  Surgeon: Carol Ada, MD;  Location: [place];  Service: Endoscopy;  Laterality: N/A;  . HEMOSTASIS CONTROL  02/26/2020   Procedure: HEMOSTASIS CONTROL;  Surgeon: Irene Shipper, MD;  Location: [facility] ENDOSCOPY;  Service: Endoscopy;;  . HOT HEMOSTASIS N/A 02/26/2020   Procedure: HOT HEMOSTASIS (ARGON PLASMA COAGULATION/BICAP);  Surgeon: Irene Shipper, MD;  Location: [facility] ENDOSCOPY;  Service: Endoscopy;  Laterality: N/A;  . MOHS SURGERY  2012   melanoma, mid back, [place]  . ROUX-EN-Y PROCEDURE  2000  . TOTAL SHOULDER REPLACEMENT Right 2011   [place]    Medications: Prior to Admission medications   Medication Sig Start Date End Date Taking? Authorizing Provider  calcium carbonate (OS-CAL - DOSED IN MG  OF ELEMENTAL CALCIUM) 1250 (500 Ca) MG tablet Take 1 tablet by mouth daily with supper.   Yes [provider]  Cholecalciferol (VITAMIN D) 2000 UNITS tablet Take 1 tablet (2,000 Units total) by mouth daily. 10/26/13  Yes [name], MD  clonazePAM (KLONOPIN) 1 MG tablet Take 1 tablet (1 mg total) by mouth daily as needed. Patient taking differently: Take 1 mg by mouth daily. 01/22/20  Yes [name], [name], MD  escitalopram (LEXAPRO) 10 MG tablet TAKE 1 TABLET(10 MG) BY MOUTH DAILY Patient taking differently: Take 10 mg by mouth daily. 11/23/19  Yes Burns, [name], MD  hydrocortisone (ANUSOL-HC) 25 MG suppository 1 suppository every evening for 7 days,1 suppository every other evening Patient taking differently: Place 25 mg rectally daily. 03/06/20  Yes Barb Merino, MD  levothyroxine (SYNTHROID) 88 MCG tablet Take 1 tablet (88 mcg total) by mouth daily before breakfast. 06/26/19  Yes [name], MD  Multiple Vitamin (MULTIVITAMIN WITH MINERALS) TABS tablet Take 1 tablet by mouth daily.   Yes [provider]  omeprazole (PRILOSEC) 40 MG capsule Take 1 capsule (40 mg total) by mouth 2 (two) times daily. 03/12/20  Yes Esterwood, Amy S, PA-C  Oxycodone HCl 10 MG TABS Take 1 tablet (10 mg total) by mouth every 6 (six) hours as needed (chronic pain - pelvic fx). 03/26/20  Yes Burns, [name], MD  predniSONE (DELTASONE) 5 MG tablet Take 1 tablet (5 mg total) by mouth daily with breakfast. 06/26/19  Yes [name], MD  sucralfate (CARAFATE) 1 GM/10ML suspension Take 10 mLs (1 g total) by mouth 4 (four) times daily -  with meals and at bedtime for 14 days. 02/29/20 03/14/20 Yes Shelly Coss, MD  tamsulosin (FLOMAX) 0.4 MG CAPS capsule TAKE 1 CAPSULE(0.4 MG) BY MOUTH DAILY AFTER SUPPER Patient taking differently: Take 0.4 mg by mouth daily. 11/30/19  Yes Burns, [name], MD  polyethylene glycol (MIRALAX / GLYCOLAX) 17 g packet Take 17 g by mouth daily as needed for mild  constipation or moderate constipation. Patient not taking: No sig reported 02/20/20   Flora Lipps, MD    Allergies:  No Known Allergies  Social History:  reports that he quit smoking about 57 years ago. He has never used smokeless tobacco. He reports previous alcohol use. He reports that he does not use drugs.  Family History: Family History  Problem Relation Age of Onset  . Hypertension Father   . Macular degeneration Mother   . Melanoma Mother   . Melanoma Sister   . Breast cancer Sister   . Uterine cancer Sister   . Stomach cancer Sister   . Melanoma Brother      Objective   Physical Exam: Blood pressure 114/68, pulse 84, temperature 97.8 F (36.6 C), temperature source Oral, resp. rate 20, SpO2 100 %.  Physical Exam Vitals and nursing note reviewed.  Constitutional:      Appearance: Normal appearance.  HENT:     Head: Normocephalic  and atraumatic.  Eyes:     Conjunctiva/sclera: Conjunctivae normal.  Cardiovascular:     Rate and Rhythm: Normal rate and regular rhythm.  Pulmonary:     Effort: Pulmonary effort is normal.     Breath sounds: Normal breath sounds.  Abdominal:     General: Abdomen is flat. There is no distension.     Palpations: Abdomen is soft.     Tenderness: There is no abdominal tenderness.  Musculoskeletal:        General: No swelling or tenderness.  Skin:    Coloration: Skin is pale. Skin is not jaundiced.  Neurological:     Mental Status: He is alert. Mental status is at baseline.  Psychiatric:        Mood and Affect: Mood normal.        Behavior: Behavior normal.     LABS on Admission: I have personally reviewed all the labs and imaging below    Basic Metabolic Panel: Recent Labs  Lab 03/27/20 1130  NA 141  K 4.0  CL 111  CO2 22  GLUCOSE 80  BUN 22  CREATININE 1.00  CALCIUM 8.2*   Liver Function Tests: Recent Labs  Lab 03/27/20 1130  AST 24  ALT 14  ALKPHOS 62  BILITOT 0.6  PROT 4.8*  ALBUMIN 2.6*   No  results for input(s): LIPASE, AMYLASE in the last 168 hours. No results for input(s): AMMONIA in the last 168 hours. CBC: Recent Labs  Lab 03/27/20 1130  WBC 8.1  HGB 7.9*  HCT 26.4*  MCV 101.1*  PLT 207   Cardiac Enzymes: No results for input(s): CKTOTAL, CKMB, CKMBINDEX, TROPONINI in the last 168 hours. BNP: Invalid input(s): POCBNP CBG: No results for input(s): GLUCAP in the last 168 hours.  Radiological Exams on Admission:  CT Angio Abd/Pel W and/or Wo Contrast  Result Date: 03/27/2020 CLINICAL DATA:  GI bleed EXAM: CT ANGIOGRAPHY ABDOMEN AND PELVIS WITH CONTRAST AND WITHOUT CONTRAST TECHNIQUE: Multidetector CT imaging of the abdomen and pelvis was performed using the standard protocol during bolus administration of intravenous contrast. Multiplanar reconstructed images and MIPs were obtained and reviewed to evaluate the vascular anatomy. CONTRAST:  158mL OMNIPAQUE IOHEXOL 350 MG/ML SOLN COMPARISON:  None. FINDINGS: VASCULAR Aorta: Infrarenal aorta has calcific atherosclerosis without aneurysm, dissection, rupture or acute occlusive process. Celiac: Atherosclerotic origin but remains patent including its branches. No active bleeding in the celiac vascular territory SMA: Atherosclerotic origin but remains patent including its branches. No active bleeding in the SMA vascular territory. Renals: Both main renal arteries appear widely patent. No accessory renal artery. IMA: Atherosclerotic origin but remains patent including its branches. No active bleeding in the IMA vascular territory. Inflow: Atherosclerosis of the iliac vessels without inflow disease or occlusion. Common, internal, and external iliac arteries are patent. Proximal Outflow: Common femoral, proximal profunda femoral, and proximal superficial femoral arteries demonstrated appear patent. Veins: No veno-occlusive process. Review of the MIP images confirms the above findings. NON-VASCULAR Lower chest: Small non loculated pleural  effusions. Associated dependent bibasilar atelectasis. Normal heart size. Mitral valve annulus calcifications noted. No pericardial effusion. Small hiatal hernia noted. Hepatobiliary: Remote cholecystectomy. Mild biliary prominence, suspect post cholecystectomy related. No other significant focal hepatic abnormality. Pancreas: Hypodense cystic lesion in the pancreas head measures 1.8 cm. Similar indeterminate hypodense cystic area in the pancreas body measures 1.5 cm. No associated ductal dilatation or pancreatic atrophy. These cystic lesions are most compatible with chronic pseudo cyst, branch duct IMPN, or small cyst adenomas.  Recommend follow-up MRI in 3-6 months. Spleen: Normal in size without focal abnormality. Adrenals/Urinary Tract: No adrenal abnormality. Small exophytic hypodense renal cyst in the left kidney upper pole measures 1.3 cm. No renal obstruction or hydronephrosis. No hydroureter or ureteral calculus. Bladder unremarkable. Stomach/Bowel: Postop changes from gastric bypass. Small bowel anastomosis noted in the left abdomen. No significant dilatation, ileus, obstruction pattern, or free air. Normal appendix demonstrated. Distal colon diverticulosis noted without acute inflammation. No free fluid, fluid collection, hemorrhage, hematoma, abscess, or ascites. Lymphatic: No bulky adenopathy. Reproductive: No significant finding by CT. Other: No abdominal wall hernia or abnormality. No abdominopelvic ascites. Musculoskeletal: Degenerative changes noted of the spine. Bones are osteopenic. Healing left rami fractures noted. Bilateral L5 pars defects with chronic anterolisthesis of L5 upon S1 and associated degenerative disc disease. IMPRESSION: VASCULAR No evidence of active arterial GI bleeding by CTA. Abdominal atherosclerosis as detailed above. No other acute intra-abdominopelvic vascular finding. NON-VASCULAR Indeterminate pancreatic head and body cystic lesions as detailed above. Recommend follow-up  nonemergent MRI in 3-6 months for further characterization and to determine stability. Small layering pleural effusions and basilar atelectasis Postop changes of gastric bypass and cholecystectomy. Diverticulosis without acute inflammatory process. Healing left rami fractures Electronically Signed   By: Jerilynn Mages.  Shick M.D.   On: 03/27/2020 16:02      EKG: Not done   A & P   Principal Problem:   GI bleed Active Problems:   Adrenal insufficiency due to cancer therapy (HCC)   Anxiety and depression   Closed fracture of left superior pubic ramus (HCC)   Acute blood loss anemia   1. Acute blood loss anemia secondary to acute GI bleed, s/p 1 unit PRBCs in the setting of recurrent GI bleeds a. CTA abdomen pelvis without evidence of active bleed b. Clear liquid diet, n.p.o. after midnight c. Per GI: If recurrent acute bleeding then order tagged RBC scan stat.  Plan for likely EGD and possible capsule study tomorrow d. Protonix 40 mg IV twice daily e. Trend H&H  2. Presyncope suspect secondary to acute blood loss anemia a. Continue with IV fluids b. PRBC transfusion as needed c. Telemetry  3. Central hypothyroidism  central adrenal insufficiency a. Continue home levothyroxine and prednisone  4. Left pubic ramus fracture a. Continue home as needed oxycodone  5. Anxiety a. Continue home Klonopin    DVT prophylaxis: SCDs   Code Status: Full Code  Diet: Clear liquid diet Family Communication: Admission, patients condition and plan of care including tests being ordered have been discussed with the patient who indicates understanding and agrees with the plan and Code Status. Patient's daughter was updated  Disposition Plan: The appropriate patient status for this patient is INPATIENT. Inpatient status is judged to be reasonable and necessary in order to provide the required intensity of service to ensure the patient's safety. The patient's presenting symptoms, physical exam findings, and  initial radiographic and laboratory data in the context of their chronic comorbidities is felt to place them at high risk for further clinical deterioration. Furthermore, it is not anticipated that the patient will be medically stable for discharge from the hospital within 2 midnights of admission. The following factors support the patient status of inpatient.   " The patient's presenting symptoms include presyncope. " The worrisome physical exam findings include pallor. " The initial radiographic and laboratory data are worrisome because of anemia. " The chronic co-morbidities include recurrent GI bleed.   * I certify that at the point of  admission it is my clinical judgment that the patient will require inpatient hospital care spanning beyond 2 midnights from the point of admission due to high intensity of service, high risk for further deterioration and high frequency of surveillance required.*     Consultants  . GI  Procedures  . 1 unit PRBC  Time Spent on Admission: 62 minutes    Harold Hedge, DO [facility]  03/27/2020, 5:11 PM

## 2020-03-27 NOTE — Patient Outreach (Signed)
[facility]) Care Management  03/27/2020  Maxwell Fox [birth year] [id number]   Incoming call from [name] who report a bloody stool this am with blood pooling in the toilet. He is reporting feeling very weak, unable to stand and low BP 80/50.  Advised that these symptoms are significant especially in light of his recent hospitalizations and quick decline with his GI bleeding. Advised he should go to the ED.   He want to avoid the ED and asked if I could call Dr. Quay Burow to see if outpt CBC and transfusion could be arranged.  Dr. Quay Burow talked with me and advised her best suggestion would be for him to go to the ED however if he can get to the [facility] lab he could go there but if his CBC is indicative of severe blood loss he will still go to the ED Advised her I will talk to [name] and give him these choices.  Called pt back and advised going to ED at [facility] is best choice but she did offer the alternative listed above.  Pt states he doesn't think he can make it to the lab. Advised he must go to the ED ASAP. Verified he has a WC and he has transportation to the hospital, which he can call his daughter to take him. He agrees to follow through on this plan.  Pt voices his appreciation for the assistance from NP and Dr. Quay Burow today.  Called [facility] to advise of incoming pt.  Will follow pt during his admission.  [name] Myrtie Neither, MSN, [facility] Gerontological Nurse Practitioner [facility] Care Management [phone]

## 2020-03-27 NOTE — ED Triage Notes (Signed)
Pt arrived via walk in, blood in stools, dizziness, weakness. Recent GI bleed.

## 2020-03-28 ENCOUNTER — Inpatient Hospital Stay (HOSPITAL_COMMUNITY): Payer: Medicare Other | Admitting: Anesthesiology

## 2020-03-28 ENCOUNTER — Encounter (HOSPITAL_COMMUNITY): Payer: Self-pay | Admitting: Internal Medicine

## 2020-03-28 ENCOUNTER — Encounter: Payer: Self-pay | Admitting: Internal Medicine

## 2020-03-28 ENCOUNTER — Encounter (HOSPITAL_COMMUNITY)
Admission: EM | Disposition: A | Discharge status: Home / Self Care | Payer: Self-pay | Source: Home / Self Care | Attending: Internal Medicine

## 2020-03-28 DIAGNOSIS — D62 Acute posthemorrhagic anemia: Secondary | ICD-10-CM

## 2020-03-28 DIAGNOSIS — F419 Anxiety disorder, unspecified: Secondary | ICD-10-CM

## 2020-03-28 DIAGNOSIS — K253 Acute gastric ulcer without hemorrhage or perforation: Secondary | ICD-10-CM

## 2020-03-28 DIAGNOSIS — E273 Drug-induced adrenocortical insufficiency: Secondary | ICD-10-CM

## 2020-03-28 DIAGNOSIS — F32A Depression, unspecified: Secondary | ICD-10-CM

## 2020-03-28 DIAGNOSIS — K922 Gastrointestinal hemorrhage, unspecified: Secondary | ICD-10-CM

## 2020-03-28 HISTORY — PX: GIVENS CAPSULE STUDY: SHX5432

## 2020-03-28 HISTORY — PX: ESOPHAGOGASTRODUODENOSCOPY (EGD) WITH PROPOFOL: SHX5813

## 2020-03-28 LAB — CBC
HCT: 25.1 % — ABNORMAL LOW (ref 39.0–52.0)
Hemoglobin: 7.7 g/dL — ABNORMAL LOW (ref 13.0–17.0)
MCH: 30 pg (ref 26.0–34.0)
MCHC: 30.7 g/dL (ref 30.0–36.0)
MCV: 97.7 fL (ref 80.0–100.0)
Platelets: 148 10*3/uL — ABNORMAL LOW (ref 150–400)
RBC: 2.57 MIL/uL — ABNORMAL LOW (ref 4.22–5.81)
RDW: 16.3 % — ABNORMAL HIGH (ref 11.5–15.5)
WBC: 6.1 10*3/uL (ref 4.0–10.5)
nRBC: 0 % (ref 0.0–0.2)

## 2020-03-28 LAB — BASIC METABOLIC PANEL
Anion gap: 8 (ref 5–15)
BUN: 24 mg/dL — ABNORMAL HIGH (ref 8–23)
CO2: 21 mmol/L — ABNORMAL LOW (ref 22–32)
Calcium: 7.9 mg/dL — ABNORMAL LOW (ref 8.9–10.3)
Chloride: 109 mmol/L (ref 98–111)
Creatinine, Ser: 0.86 mg/dL (ref 0.61–1.24)
GFR, Estimated: >60 mL/min (ref >60)
Glucose, Bld: 79 mg/dL (ref 70–99)
Potassium: 4 mmol/L (ref 3.5–5.1)
Sodium: 138 mmol/L (ref 135–145)

## 2020-03-28 LAB — HEMOGLOBIN AND HEMATOCRIT, BLOOD
HCT: 25.7 % — ABNORMAL LOW (ref 39.0–52.0)
Hemoglobin: 7.9 g/dL — ABNORMAL LOW (ref 13.0–17.0)

## 2020-03-28 SURGERY — ESOPHAGOGASTRODUODENOSCOPY (EGD) WITH PROPOFOL
Anesthesia: General

## 2020-03-28 MED ORDER — PROPOFOL 10 MG/ML IV BOLUS
INTRAVENOUS | Status: DC | PRN
  Administered 2020-03-28 (×3): 20 mg via INTRAVENOUS

## 2020-03-28 MED ORDER — PROPOFOL 500 MG/50ML IV EMUL
INTRAVENOUS | Status: DC | PRN
  Administered 2020-03-28: 125 ug/kg/min via INTRAVENOUS

## 2020-03-28 MED ORDER — LIDOCAINE 2% (20 MG/ML) 5 ML SYRINGE
INTRAMUSCULAR | Status: DC | PRN
  Administered 2020-03-28: 100 mg via INTRAVENOUS

## 2020-03-28 MED ORDER — LACTATED RINGERS IV SOLN: INTRAVENOUS | Status: DC | PRN

## 2020-03-28 SURGICAL SUPPLY — 15 items
BLOCK BITE 60FR ADLT L/F BLUE (MISCELLANEOUS) ×2 IMPLANT
ELECT REM PT RETURN 9FT ADLT (ELECTROSURGICAL)
ELECTRODE REM PT RTRN 9FT ADLT (ELECTROSURGICAL) IMPLANT
FORCEP RJ3 GP 1.8X160 W-NEEDLE (CUTTING FORCEPS) IMPLANT
FORCEPS BIOP RAD 4 LRG CAP 4 (CUTTING FORCEPS) IMPLANT
NEEDLE SCLEROTHERAPY 25GX240 (NEEDLE) IMPLANT
PROBE APC STR FIRE (PROBE) IMPLANT
PROBE INJECTION GOLD (MISCELLANEOUS)
PROBE INJECTION GOLD 7FR (MISCELLANEOUS) IMPLANT
SNARE SHORT THROW 13M SML OVAL (MISCELLANEOUS) IMPLANT
SYR 50ML LL SCALE MARK (SYRINGE) IMPLANT
TOWEL COTTON PACK 4EA (MISCELLANEOUS) ×4 IMPLANT
TUBING ENDO SMARTCAP PENTAX (MISCELLANEOUS) ×4 IMPLANT
TUBING IRRIGATION ENDOGATOR (MISCELLANEOUS) ×2 IMPLANT
WATER STERILE IRR 1000ML POUR (IV SOLUTION) IMPLANT

## 2020-03-28 NOTE — Transfer of Care (Signed)
Immediate Anesthesia Transfer of Care Note  Patient: Maxwell Fox  Procedure(s) Performed: ESOPHAGOGASTRODUODENOSCOPY (EGD) WITH PROPOFOL (N/A ) GIVENS CAPSULE STUDY (N/A )  Patient Location: Endoscopy Unit  Anesthesia Type:MAC  Level of Consciousness: awake  Airway & Oxygen Therapy: Patient Spontanous Breathing and Patient connected to face mask oxygen  Post-op Assessment: Report given to RN and Post -op Vital signs reviewed and stable  Post vital signs: Reviewed and stable  Last Vitals:  Vitals Value Taken Time  BP    Temp    Pulse 59 03/28/20 1324  Resp 9 03/28/20 1324  SpO2 100 % 03/28/20 1324  Vitals shown include unvalidated device data.  Last Pain:  Vitals:   03/28/20 1217  TempSrc: Oral  PainSc: 6       Patients Stated Pain Goal: 3 (57/01/77 9390)  Complications: No complications documented.

## 2020-03-28 NOTE — TOC Initial Note (Signed)
Transition of Care [facility]) - Initial/Assessment Note    Patient Details  Name: Maxwell Fox MRN: [id number] Date of Birth: [birth year]  [facility]) CM/SW Contact:    Dessa Phi, RN Phone Number: 03/28/2020, 3:29 PM  Clinical Narrative:d/c plan return home w/Active [facility]-HHPT rep Tommi Rumps following-will need HHPT orders.                 Expected Discharge Plan: [place] Barriers to Discharge: Continued Medical Work up   Patient Goals and CMS Choice Patient states their goals for this hospitalization and ongoing recovery are:: go home CMS Medicare.gov Compare Post Acute Care list provided to:: Patient Represenative (must comment) (dtr [place] [phone]) Choice offered to / list presented to : [place]  Expected Discharge Plan and Services Expected Discharge Plan: [place]   Discharge Planning Services: CM Consult Post Acute Care Choice: Home Health,Durable Medical Equipment (rw;[facility] active-HHPT) Living arrangements for the past 2 months: Single Family Home                                      Prior Living Arrangements/Services Living arrangements for the past 2 months: Single Family Home Lives with:: Adult Children Patient language and need for interpreter reviewed:: Yes Do you feel safe going back to the place where you live?: Yes      Need for Family Participation in Patient Care: No (Comment) Care giver support system in place?: Yes (comment)   Criminal Activity/Legal Involvement Pertinent to Current Situation/Hospitalization: No - Comment as needed  Activities of Daily Living Home Assistive Devices/Equipment: Walker (specify type),Cane (specify quad or straight),Eyeglasses,Grab bars around toilet,Shower chair with back,Wheelchair,Grab bars in shower (rolling walker, hand rails on stairs) ADL Screening (condition at time of admission) Patient's cognitive ability adequate to safely complete daily  activities?: Yes Is the patient deaf or have difficulty hearing?: No Does the patient have difficulty seeing, even when wearing glasses/contacts?: No Does the patient have difficulty concentrating, remembering, or making decisions?: No Patient able to express need for assistance with ADLs?: Yes Does the patient have difficulty dressing or bathing?: Yes Independently performs ADLs?: No Communication: Independent Dressing (OT): Needs assistance Is this a change from baseline?: Pre-admission baseline Grooming: Independent Feeding: Independent Bathing: Needs assistance Is this a change from baseline?: Pre-admission baseline Toileting: Needs assistance Is this a change from baseline?: Pre-admission baseline In/Out Bed: Needs assistance Is this a change from baseline?: Pre-admission baseline Walks in Home: Needs assistance Is this a change from baseline?: Pre-admission baseline Does the patient have difficulty walking or climbing stairs?: Yes Weakness of Legs: Both Weakness of Arms/Hands: Both  Permission Sought/Granted Permission sought to share information with : Case Manager Permission granted to share information with : Yes, Verbal Permission Granted  Share Information with NAME: Case Manager     Permission granted to share info w Relationship: Shannon [phone] [id number]     Emotional Assessment Appearance:: Appears stated age Attitude/Demeanor/Rapport: Gracious Affect (typically observed): Accepting Orientation: : Oriented to Self,Oriented to Place,Oriented to  Time Alcohol / Substance Use: Not Applicable Psych Involvement: No (comment)  Admission diagnosis:  GI bleed [K92.2] Acute GI bleeding [K92.2] Symptomatic anemia [D64.9] Patient Active Problem List   Diagnosis Date Noted   Acute gastric ulcer without hemorrhage or perforation    History of Roux-en-Y gastric bypass    Hemorrhagic shock ([place])  Acute gastric ulcer with hemorrhage    Acute GI bleeding  02/14/2020   Acute blood loss anemia 02/14/2020   GI bleed 02/14/2020   Melena    Marginal ulcers    Gastroesophageal reflux disease with esophagitis without hemorrhage    Closed fracture of left superior pubic ramus ([place]) 02/09/2020   Anxiety and depression 01/10/2018   History of antineoplastic chemotherapy 07/19/2017   Chronic midline low back pain without sciatica 04/19/2017   Adrenal insufficiency due to cancer therapy ([place]) 07/27/2016   Secondary hypothyroidism 07/27/2016   H/O gastric bypass 01/06/2016   Pancreatic cyst 01/06/2016   Melanoma metastatic to brain ([place]) 12/31/2015   Iron deficiency anemia    B12 nutritional deficiency    Benign prostatic hyperplasia    PCP:  [name], MD Pharmacy:   [facility] [place], [place] [address] [place] [address] Phone: [phone] Fax: [phone]     Social Determinants of Health ([place]) Interventions    Readmission Risk Interventions Readmission Risk Prevention Plan 02/28/2020  Transportation Screening Complete  PCP or Specialist Appt within 3-5 Days Not Complete  Not Complete comments 2/4 was first available appt  [place] or [place] Complete  Social Work Consult for [place] Planning/Counseling Complete  Palliative Care Screening Not Applicable  Some recent data might be hidden

## 2020-03-28 NOTE — Progress Notes (Signed)
PROGRESS NOTE    Maxwell Fox  BSJ:[id number] DOB: 03-29-1945 DOA: 03/27/2020 PCP: Maxwell Rail, MD    Brief Narrative:  [name] was admitted to the hospital with the working diagnosis of acute blood loss anemia due to upper GI bleed.   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  He reported a presyncopal episode on the day of admission, associated with orthostatic symptoms.  On his initial physical examination temperature 97.8, blood pressure 99/64, heart rate 59, respiratory rate 20, oxygen saturation 100% on room air.  He was pale, lungs clear to auscultation bilaterally, heart S1-S2, present rhythmic, soft abdomen, no lower extremity edema.  Sodium 141, potassium 4.0, chloride 111, bicarb 22, glucose 80, BUN 22, creatinine 1.0, AST 24, ALT 14, white count 8.1, hemoglobin 7.9, hematocrit 26.4, platelets 207.  Patient received one unit PRBC and was admitted for further evaluation.   EGD with gastric healing ulcers, at the suture site, with friable mucosa but no high risk bleeding stigmata. Capsule endoscopy was deployed in the jejunum.   Assessment & Plan:   Principal Problem:   GI bleed Active Problems:   Adrenal insufficiency due to cancer therapy (HCC)   Anxiety and depression   Closed fracture of left superior pubic ramus (HCC)   Acute blood loss anemia   Acute gastric ulcer without hemorrhage or perforation   1. Acute blood loss anemia due to upper GI bleed. Sp 1 unit PRBC transfusion Hgb is 7,7 and hct 25.1  EGD with friable mucosa and oozing but no high risk bleeding stigmata.   Continue medical therapy with proton pump inhibitors and follow with capsule endoscopy.  2. Central hypothyroid and central adrenal insufficiency, sp  brain melanoma resection. Continue hormonal replacement therapy with levothyroxine and prednisone.  If recurrent hypotension will need stress dose steroids.   3. Left pubic ramus fracture. Continue pain control with oxycodone.   4. Anxiety. On clonazepam and escitalopram with good toleration.    Patient continue to be at high risk for worsening anemia   Status is: Inpatient  Remains inpatient appropriate because:IV treatments appropriate due to intensity of illness or inability to take PO   Dispo: The patient is from: Home              Anticipated d/c is to: Home              Patient currently is not medically stable to d/c.   Difficult to place patient No   DVT prophylaxis: scd   Code Status:   full  Family Communication:  I spoke with patient's daughter at the bedside, we talked in detail about patient's condition, plan of care and prognosis and all questions were addressed.      Consultants:   GI   Procedures:   EGD      Subjective: Patient is feeling better, but continue to be very weak and deconditioned not at his baseline. No nausea or vomiting, no hematemesis.   Objective: Vitals:   03/28/20 1325 03/28/20 1340 03/28/20 1345 03/28/20 1408  BP: (!) 93/48 (!) 110/49 (!) 111/55 126/68  Pulse: (!) 58 60 (!) 57 (!) 56  Resp: 19 13 14 16   Temp: (!) 97.2 F (36.2 C)   (!) 97.5 F (36.4 C)  TempSrc: Oral   Oral  SpO2: 100% 100%  100% 100%  Weight:      Height:        Intake/Output Summary (Last 24 hours) at 03/28/2020 1426 Last data filed at 03/28/2020 1325 Gross per 24 hour  Intake 2643.34 ml  Output 1100 ml  Net 1543.34 ml   Filed Weights   03/27/20 2206  Weight: 91.6 kg    Examination:   General deconditioned and ill looking appearing Neurology: Awake and alert, non focal  E ENT: mild pallor, no icterus, oral mucosa moist Cardiovascular: No JVD. S1-S2 present, rhythmic, no gallops, rubs, or murmurs. No lower extremity edema. Pulmonary positive  breath sounds bilaterally, adequate air movement, no wheezing, rhonchi or rales. Gastrointestinal. Abdomen soft and non tender Skin. No rashes Musculoskeletal: no joint deformities     Data Reviewed: I have personally reviewed following labs and imaging studies  CBC: Recent Labs  Lab 03/27/20 1130 03/27/20 1805 03/28/20 0040 03/28/20 1021  WBC 8.1  --  6.1  --   HGB 7.9* 8.4* 7.7* 7.9*  HCT 26.4* 26.9* 25.1* 25.7*  MCV 101.1*  --  97.7  --   PLT 207  --  148*  --    Basic Metabolic Panel: Recent Labs  Lab 03/27/20 1130 03/28/20 0040  NA 141 138  K 4.0 4.0  CL 111 109  CO2 22 21*  GLUCOSE 80 79  BUN 22 24*  CREATININE 1.00 0.86  CALCIUM 8.2* 7.9*   GFR: Estimated Creatinine Clearance: 87.2 mL/min (by C-G formula based on SCr of 0.86 mg/dL). Liver Function Tests: Recent Labs  Lab 03/27/20 1130  AST 24  ALT 14  ALKPHOS 62  BILITOT 0.6  PROT 4.8*  ALBUMIN 2.6*   No results for input(s): LIPASE, AMYLASE in the last 168 hours. No results for input(s): AMMONIA in the last 168 hours. Coagulation Profile: No results for input(s): INR, PROTIME in the last 168 hours. Cardiac Enzymes: No results for input(s): CKTOTAL, CKMB, CKMBINDEX, TROPONINI in the last 168 hours. BNP (last 3 results) No results for input(s): PROBNP in the last 8760 hours. HbA1C: No results for input(s): HGBA1C in the last 72 hours. CBG: No results for input(s): GLUCAP in the last 168 hours. Lipid Profile: No results for input(s): CHOL, HDL, LDLCALC, TRIG, CHOLHDL, LDLDIRECT in the last 72 hours. Thyroid Function Tests: No results for input(s): TSH, T4TOTAL, FREET4, T3FREE, THYROIDAB in the last 72 hours. Anemia Panel: No results for input(s): VITAMINB12, FOLATE, FERRITIN, TIBC, IRON, RETICCTPCT in the last 72 hours.    Radiology Studies: I have reviewed all of the imaging during this hospital visit personally     Scheduled Meds: . escitalopram  10 mg Oral Daily  . levothyroxine   88 mcg Oral QAC breakfast  . pantoprazole (PROTONIX) IV  40 mg Intravenous Q12H  . predniSONE  5 mg Oral Q breakfast  . sodium chloride flush  3 mL Intravenous Q12H   Continuous Infusions: . sodium chloride Stopped (03/27/20 1611)     LOS: 1 day        Maxwell Matkins Gerome Apley, MD

## 2020-03-28 NOTE — Anesthesia Procedure Notes (Signed)
Date/Time: 03/28/2020 1:01 PM Performed by: Sharlette Dense, CRNA Oxygen Delivery Method: Simple face mask

## 2020-03-28 NOTE — Anesthesia Preprocedure Evaluation (Addendum)
Anesthesia Evaluation  Patient identified by MRN, date of birth, ID band Patient awake    Reviewed: Allergy & Precautions, NPO status , Patient's Chart, lab work & pertinent test results  Airway Mallampati: II  TM Distance: >3 FB Neck ROM: Full    Dental  (+) Teeth Intact, Dental Advisory Given Permanent implants:   Pulmonary former smoker,  Quit smoking 1965   Pulmonary exam normal breath sounds clear to auscultation       Cardiovascular hypertension, Pt. on medications +CHF (grade 1 diastolic dysfunction on echo 2020, LVEF 50%)  Normal cardiovascular exam+ Valvular Problems/Murmurs (mild MR on echo 2020) MR  Rhythm:Regular Rate:Normal     Neuro/Psych PSYCHIATRIC DISORDERS Anxiety Depression negative neurological ROS     GI/Hepatic Neg liver ROS, PUD, GERD  Medicated and Controlled,Recurrent GIB S/p gastric bypass 2000   Endo/Other  Hypothyroidism   Renal/GU negative Renal ROS  negative genitourinary   Musculoskeletal left superior pubic ramus fracture on chronic opiates- oxy 10   Abdominal   Peds  Hematology  (+) Blood dyscrasia (Fe deficiency anemia ), anemia , GIB H/H 7.7/25.1    Anesthesia Other Findings   Reproductive/Obstetrics negative OB ROS                            Anesthesia Physical Anesthesia Plan  ASA: III  Anesthesia Plan: MAC   Post-op Pain Management:    Induction:   PONV Risk Score and Plan: 2 and Treatment may vary due to age or medical condition, Propofol infusion and TIVA  Airway Management Planned: Natural Airway and Simple Face Mask  Additional Equipment: None  Intra-op Plan:   Post-operative Plan:   Informed Consent: I have reviewed the patients History and Physical, chart, labs and discussed the procedure including the risks, benefits and alternatives for the proposed anesthesia with the patient or authorized representative who has indicated  his/her understanding and acceptance.     Dental advisory given  Plan Discussed with: CRNA  Anesthesia Plan Comments:     Anesthesia Quick Evaluation

## 2020-03-28 NOTE — Progress Notes (Addendum)
Patient ID: Maxwell Fox, male   DOB: 1945-08-14, 75 y.o.   MRN: [id number]    Progress Note   Subjective  Day # 2  CC; GI bleed  CT angi - negative for active bleed,  Small  Effusions nonloculated.  Remote cholecystectomy, mild biliary prominence suspect postcholecystectomy related Hypodense cystic lesion in the pancreas 1.8 cm and 1.5 cm similar hypodense cystic area in the pancreas body, most compatible with chronic pseudocyst, branch IPMN.  Postop changes from gastric bypass, small bowel anastomosis in the left abdomen distal colonic diverticulosis, no bulky adenopathy.  Hemoglobin 7.7 early this a.m. post 1 unit yesterday BUN 24/creatinine 0.86  Patient says he feels fine this morning, he had not had any further bowel movements until he took the prep this morning.  He says he passed brown watery stool, no overt blood     Objective   Vital signs in last 24 hours: Temp:  [97.6 F (36.4 C)-98.4 F (36.9 C)] 97.8 F (36.6 C) (02/24 0432) Pulse Rate:  [56-92] 59 (02/24 0432) Resp:  [10-23] 20 (02/24 0432) BP: (74-125)/(55-78) 125/66 (02/24 0432) SpO2:  [96 %-100 %] 100 % (02/24 0432) Weight:  [91.6 kg] 91.6 kg (02/23 2206) Last BM Date: 03/28/20 General: Older white male in NAD Heart:  Regular rate and rhythm; no murmurs Lungs: Respirations even and unlabored, lungs CTA bilaterally Abdomen:  Soft, nontender and nondistended.  Midline incisional scar normal bowel sounds. Extremities:  Without edema. Neurologic:  Alert and oriented,  grossly normal neurologically. Psych:  Cooperative. Normal mood and affect.  Intake/Output from previous day: 02/23 0701 - 02/24 0700 In: 2393.3 [P.O.:1300; I.V.:1093.3] Out: 900 [Urine:900] Intake/Output this shift: No intake/output data recorded.  Lab Results: Recent Labs    03/27/20 1130 03/27/20 1805 03/28/20 0040  WBC 8.1  --  6.1  HGB 7.9* 8.4* 7.7*  HCT 26.4* 26.9* 25.1*  PLT 207  --  148*   BMET Recent Labs     03/27/20 1130 03/28/20 0040  NA 141 138  K 4.0 4.0  CL 111 109  CO2 22 21*  GLUCOSE 80 79  BUN 22 24*  CREATININE 1.00 0.86  CALCIUM 8.2* 7.9*   LFT Recent Labs    03/27/20 1130  PROT 4.8*  ALBUMIN 2.6*  AST 24  ALT 14  ALKPHOS 62  BILITOT 0.6   PT/INR No results for input(s): LABPROT, INR in the last 72 hours.  Studies/Results: CT Angio Abd/Pel W and/or Wo Contrast  Result Date: 03/27/2020 CLINICAL DATA:  GI bleed EXAM: CT ANGIOGRAPHY ABDOMEN AND PELVIS WITH CONTRAST AND WITHOUT CONTRAST TECHNIQUE: Multidetector CT imaging of the abdomen and pelvis was performed using the standard protocol during bolus administration of intravenous contrast. Multiplanar reconstructed images and MIPs were obtained and reviewed to evaluate the vascular anatomy. CONTRAST:  171mL OMNIPAQUE IOHEXOL 350 MG/ML SOLN COMPARISON:  None. FINDINGS: VASCULAR Aorta: Infrarenal aorta has calcific atherosclerosis without aneurysm, dissection, rupture or acute occlusive process. Celiac: Atherosclerotic origin but remains patent including its branches. No active bleeding in the celiac vascular territory SMA: Atherosclerotic origin but remains patent including its branches. No active bleeding in the SMA vascular territory. Renals: Both main renal arteries appear widely patent. No accessory renal artery. IMA: Atherosclerotic origin but remains patent including its branches. No active bleeding in the IMA vascular territory. Inflow: Atherosclerosis of the iliac vessels without inflow disease or occlusion. Common, internal, and external iliac arteries are patent. Proximal Outflow: Common femoral, proximal profunda femoral, and proximal superficial femoral  arteries demonstrated appear patent. Veins: No veno-occlusive process. Review of the MIP images confirms the above findings. NON-VASCULAR Lower chest: Small non loculated pleural effusions. Associated dependent bibasilar atelectasis. Normal heart size. Mitral valve annulus  calcifications noted. No pericardial effusion. Small hiatal hernia noted. Hepatobiliary: Remote cholecystectomy. Mild biliary prominence, suspect post cholecystectomy related. No other significant focal hepatic abnormality. Pancreas: Hypodense cystic lesion in the pancreas head measures 1.8 cm. Similar indeterminate hypodense cystic area in the pancreas body measures 1.5 cm. No associated ductal dilatation or pancreatic atrophy. These cystic lesions are most compatible with chronic pseudo cyst, branch duct IMPN, or small cyst adenomas. Recommend follow-up MRI in 3-6 months. Spleen: Normal in size without focal abnormality. Adrenals/Urinary Tract: No adrenal abnormality. Small exophytic hypodense renal cyst in the left kidney upper pole measures 1.3 cm. No renal obstruction or hydronephrosis. No hydroureter or ureteral calculus. Bladder unremarkable. Stomach/Bowel: Postop changes from gastric bypass. Small bowel anastomosis noted in the left abdomen. No significant dilatation, ileus, obstruction pattern, or free air. Normal appendix demonstrated. Distal colon diverticulosis noted without acute inflammation. No free fluid, fluid collection, hemorrhage, hematoma, abscess, or ascites. Lymphatic: No bulky adenopathy. Reproductive: No significant finding by CT. Other: No abdominal wall hernia or abnormality. No abdominopelvic ascites. Musculoskeletal: Degenerative changes noted of the spine. Bones are osteopenic. Healing left rami fractures noted. Bilateral L5 pars defects with chronic anterolisthesis of L5 upon S1 and associated degenerative disc disease. IMPRESSION: VASCULAR No evidence of active arterial GI bleeding by CTA. Abdominal atherosclerosis as detailed above. No other acute intra-abdominopelvic vascular finding. NON-VASCULAR Indeterminate pancreatic head and body cystic lesions as detailed above. Recommend follow-up nonemergent MRI in 3-6 months for further characterization and to determine stability. Small  layering pleural effusions and basilar atelectasis Postop changes of gastric bypass and cholecystectomy. Diverticulosis without acute inflammatory process. Healing left rami fractures Electronically Signed   By: Jerilynn Mages.  Shick M.D.   On: 03/27/2020 16:02       Assessment / Plan:    #42 75 year old white male admitted yesterday with recurrent GI bleed.  Patient reported 2 days of darker stool and then had an episode of large-volume bright red blood yesterday morning with associated presyncope.  Hypotensive on arrival to the emergency room.  He has not had any further active bleeding since. Hemoglobin had dropped from 10.6-7.9.  He received 1 unit yesterday and hemoglobin early this a.m. was 7.7.  CT angio unable to identify source for bleed  Patient has had 2 recent admissions with recurrent bleeding.  Found to have an anastomotic ulceration with visible vessel in patient status post Roux-en-Y gastrojejunostomy on EGD 02/29/2020.  Repeat bleed requiring repeat admission a few days later and repeat EGD showing clean-based 8 mm anastomotic ulcer, and colonoscopy revealing diverticulosis left colon and internal hemorrhoids.  No blood in the colon at the time of procedure.  Etiology of current bleeding is unclear had suspected lower GI source due to bright red blood.  However cannot rule out small bowel source, and/or recurrent bleed from anastomotic ulceration.  Patient has history of metastatic melanoma to the brain with lesion resected 2017, completed immunotherapy.  He has had no evidence of active disease since, and is allowed at [facility]. Melanoma can metastasize to the small bowel.  #2 anemia acute on chronic secondary to blood loss  #3 recent pubic rami fracture 4 history of B12 and iron deficiency  Plan; n.p.o. this morning, repeat hemoglobin now then continue every 6 hour hemoglobins and plan to transfuse for  hemoglobin less than 7.5.  Continue IV PPI for now Patient is scheduled for upper  endoscopy and we will also place capsule for capsule endoscopy at the time of EGD early this afternoon with Dr. Henrene Pastor.  Both procedures were discussed in detail with the patient including indications risks benefits and he is agreeable to proceed.      LOS: 1 day   [name] PA-C 03/28/2020, 8:36 AM   GI ATTENDING  Interval history data reviewed.  No further bleeding since acute episode.  Now stable.  For upper endoscopy to reassess anastomotic ulcer.  If no obvious cause for recurrent bleeding identified, then capsule endoscopy to be performed.The nature of the procedure, as well as the risks, benefits, and alternatives were carefully and thoroughly reviewed with the patient. Ample time for discussion and questions allowed. The patient understood, was satisfied, and agreed to proceed.  Docia Chuck. Geri Seminole., M.D. [facility] Division of Gastroenterology

## 2020-03-28 NOTE — Progress Notes (Signed)
Givens capsule was deployed during EGD procedure and device activated.  Instructions given to patient and to receiving RN.  Both stated understanding.

## 2020-03-28 NOTE — Op Note (Signed)
[facility] Patient Name: Maxwell Fox Procedure Date: 03/28/2020 MRN: [id number] Attending MD: Docia Chuck. Henrene Pastor , MD Date of Birth: 1945/11/22 CSN: [id number] Age: 75 Admit Type: Inpatient Procedure:                Upper GI endoscopy with capsule deployment Indications:              Hematochezia, Melena Providers:                Docia Chuck. Henrene Pastor, MD, Nelia Shi, RN, Cletis Athens, Technician Referring MD:             [facility] hospitalist Medicines:                Monitored Anesthesia Care Complications:            No immediate complications. Estimated Blood Loss:     Estimated blood loss: none. Procedure:                Pre-Anesthesia Assessment:                           - Prior to the procedure, a History and Physical                            was performed, and patient medications and                            allergies were reviewed. The patient's tolerance of                            previous anesthesia was also reviewed. The risks                            and benefits of the procedure and the sedation                            options and risks were discussed with the patient.                            All questions were answered, and informed consent                            was obtained. Prior Anticoagulants: The patient has                            taken no previous anticoagulant or antiplatelet                            agents. After reviewing the risks and benefits, the                            patient was deemed in satisfactory condition to  undergo the procedure.                           After obtaining informed consent, the endoscope was                            passed under direct vision. Throughout the                            procedure, the patient's blood pressure, pulse, and                            oxygen saturations were monitored continuously. The                             [id number]-H190 ([id number]) Olympus gastroscope was                            introduced through the mouth, and advanced to the                            jejunum. The upper GI endoscopy was accomplished                            without difficulty. The patient tolerated the                            procedure well. Scope In: Scope Out: Findings:      The esophagus was normal.      The stomach revealed evidence of prior gastric bypass surgery. The       anastomosis was widely patent. Previously noted ulcers demonstrate       ongoing healing. The mucosa is somewhat friable but there are no high       risk bleeding stigmata. There is both suture material and staple       material in the region of the ulcers.      The examined jejunum beyond the anastomosis is unremarkable.      The endoscopic survey was completed. Subsequently, the capsule endoscopy       was loaded on the end of the standard endoscope which was reintroduced       into the esophagus and eventually deployed in the jejunum. Impression:               1. Status post Roux-en-Y gastric bypass surgery                            with expected postoperative anatomy                           2. Ongoing healing of previously noted ulcers.                            Slightly friable mucosa but no active bleeding or                            high risk  stigmata. Suture and staple material in                            that region as noted                           3. Status post capsule endoscopy deployment. Moderate Sedation:      none Recommendation:           1. N.p.o. for 2 hours then clear liquids for 4                            hours then resume previous diet                           2. Continue other medications                           3. Continue to monitor for evidence of bleeding                           4. Capsule endoscopy to be read after completion                            and computer downloading of the data.  Probably                            tomorrow afternoon at the soonest.                           Discussed with the patient's daughter Maxwell Fox                            [phone] Procedure Code(s):        --- Professional ---                           [id number], Esophagogastroduodenoscopy, flexible,                            transoral; diagnostic, including collection of                            specimen(s) by brushing or washing, when performed                            (separate procedure) Diagnosis Code(s):        --- Professional ---                           K92.1, Melena (includes Hematochezia) CPT copyright 2019 [employer]. All rights reserved. The codes documented in this report are preliminary and upon coder review may  be revised to meet current compliance requirements. Docia Chuck. Henrene Pastor, MD 03/28/2020 1:28:08 PM This report has been signed electronically. Number of Addenda: 0

## 2020-03-29 DIAGNOSIS — R933 Abnormal findings on diagnostic imaging of other parts of digestive tract: Secondary | ICD-10-CM

## 2020-03-29 LAB — BASIC METABOLIC PANEL
Anion gap: 7 (ref 5–15)
BUN: 16 mg/dL (ref 8–23)
CO2: 25 mmol/L (ref 22–32)
Calcium: 8.2 mg/dL — ABNORMAL LOW (ref 8.9–10.3)
Chloride: 111 mmol/L (ref 98–111)
Creatinine, Ser: 0.91 mg/dL (ref 0.61–1.24)
GFR, Estimated: >60 mL/min (ref >60)
Glucose, Bld: 84 mg/dL (ref 70–99)
Potassium: 4.3 mmol/L (ref 3.5–5.1)
Sodium: 143 mmol/L (ref 135–145)

## 2020-03-29 LAB — CBC
HCT: 24 % — ABNORMAL LOW (ref 39.0–52.0)
Hemoglobin: 7.4 g/dL — ABNORMAL LOW (ref 13.0–17.0)
MCH: 30 pg (ref 26.0–34.0)
MCHC: 30.8 g/dL (ref 30.0–36.0)
MCV: 97.2 fL (ref 80.0–100.0)
Platelets: 155 10*3/uL (ref 150–400)
RBC: 2.47 MIL/uL — ABNORMAL LOW (ref 4.22–5.81)
RDW: 15.8 % — ABNORMAL HIGH (ref 11.5–15.5)
WBC: 3.7 10*3/uL — ABNORMAL LOW (ref 4.0–10.5)
nRBC: 0 % (ref 0.0–0.2)

## 2020-03-29 LAB — PREPARE RBC (CROSSMATCH)

## 2020-03-29 MED ORDER — ACETAMINOPHEN 325 MG PO TABS: 650.0000 mg | ORAL_TABLET | Freq: Once | ORAL | Status: DC

## 2020-03-29 MED ORDER — SODIUM CHLORIDE 0.9% IV SOLUTION: Freq: Once | INTRAVENOUS | Status: AC

## 2020-03-29 NOTE — Progress Notes (Addendum)
Patient ID: Maxwell Fox, male   DOB: April 14, 1945, 75 y.o.   MRN: [id number]    Progress Note   Subjective   Day# 3 CC;GI Bleed/ recurrent  EGD -healing ulcer , somewhat friable but no active bleeding or oozing , suture material and staple material in region of ulcers, capsule deployed   HGB 7.4 this am , down from 7.9  ` Dark watery stool this a.m.  No complaints of abdominal discomfort.  Patient says he feels weak and fatigued and feels he would feel better with blood transfusion.   Objective   Vital signs in last 24 hours: Temp:  [97.2 F (36.2 C)-98.2 F (36.8 C)] 97.8 F (36.6 C) (02/25 0621) Pulse Rate:  [56-62] 62 (02/25 0621) Resp:  [8-20] 18 (02/24 2135) BP: (93-140)/(48-71) 140/71 (02/25 0621) SpO2:  [97 %-100 %] 97 % (02/25 0621) Last BM Date: 03/28/20 General: Older white male  in NAD, pale Heart:  Regular rate and rhythm; no murmurs Lungs: Respirations even and unlabored, lungs CTA bilaterally Abdomen:  Soft, nontender and nondistended. Normal bowel sounds. Extremities:  Without edema. Neurologic:  Alert and oriented,  grossly normal neurologically. Psych:  Cooperative. Normal mood and affect.  Intake/Output from previous day: 02/24 0701 - 02/25 0700 In: 370 [P.O.:120; I.V.:250] Out: 950 [Urine:950] Intake/Output this shift: No intake/output data recorded.  Lab Results: Recent Labs    03/27/20 1130 03/27/20 1805 03/28/20 0040 03/28/20 1021 03/29/20 0453  WBC 8.1  --  6.1  --  3.7*  HGB 7.9*   < > 7.7* 7.9* 7.4*  HCT 26.4*   < > 25.1* 25.7* 24.0*  PLT 207  --  148*  --  155   < > = values in this interval not displayed.   BMET Recent Labs    03/27/20 1130 03/28/20 0040 03/29/20 0453  NA 141 138 143  K 4.0 4.0 4.3  CL 111 109 111  CO2 22 21* 25  GLUCOSE 80 79 84  BUN 22 24* 16  CREATININE 1.00 0.86 0.91  CALCIUM 8.2* 7.9* 8.2*   LFT Recent Labs    03/27/20 1130  PROT 4.8*  ALBUMIN 2.6*  AST 24  ALT 14  ALKPHOS 62  BILITOT 0.6    PT/INR No results for input(s): LABPROT, INR in the last 72 hours.  Studies/Results: CT Angio Abd/Pel W and/or Wo Contrast  Result Date: 03/27/2020 CLINICAL DATA:  GI bleed EXAM: CT ANGIOGRAPHY ABDOMEN AND PELVIS WITH CONTRAST AND WITHOUT CONTRAST TECHNIQUE: Multidetector CT imaging of the abdomen and pelvis was performed using the standard protocol during bolus administration of intravenous contrast. Multiplanar reconstructed images and MIPs were obtained and reviewed to evaluate the vascular anatomy. CONTRAST:  171mL OMNIPAQUE IOHEXOL 350 MG/ML SOLN COMPARISON:  None. FINDINGS: VASCULAR Aorta: Infrarenal aorta has calcific atherosclerosis without aneurysm, dissection, rupture or acute occlusive process. Celiac: Atherosclerotic origin but remains patent including its branches. No active bleeding in the celiac vascular territory SMA: Atherosclerotic origin but remains patent including its branches. No active bleeding in the SMA vascular territory. Renals: Both main renal arteries appear widely patent. No accessory renal artery. IMA: Atherosclerotic origin but remains patent including its branches. No active bleeding in the IMA vascular territory. Inflow: Atherosclerosis of the iliac vessels without inflow disease or occlusion. Common, internal, and external iliac arteries are patent. Proximal Outflow: Common femoral, proximal profunda femoral, and proximal superficial femoral arteries demonstrated appear patent. Veins: No veno-occlusive process. Review of the MIP images confirms the above findings. NON-VASCULAR Lower  chest: Small non loculated pleural effusions. Associated dependent bibasilar atelectasis. Normal heart size. Mitral valve annulus calcifications noted. No pericardial effusion. Small hiatal hernia noted. Hepatobiliary: Remote cholecystectomy. Mild biliary prominence, suspect post cholecystectomy related. No other significant focal hepatic abnormality. Pancreas: Hypodense cystic lesion in the  pancreas head measures 1.8 cm. Similar indeterminate hypodense cystic area in the pancreas body measures 1.5 cm. No associated ductal dilatation or pancreatic atrophy. These cystic lesions are most compatible with chronic pseudo cyst, branch duct IMPN, or small cyst adenomas. Recommend follow-up MRI in 3-6 months. Spleen: Normal in size without focal abnormality. Adrenals/Urinary Tract: No adrenal abnormality. Small exophytic hypodense renal cyst in the left kidney upper pole measures 1.3 cm. No renal obstruction or hydronephrosis. No hydroureter or ureteral calculus. Bladder unremarkable. Stomach/Bowel: Postop changes from gastric bypass. Small bowel anastomosis noted in the left abdomen. No significant dilatation, ileus, obstruction pattern, or free air. Normal appendix demonstrated. Distal colon diverticulosis noted without acute inflammation. No free fluid, fluid collection, hemorrhage, hematoma, abscess, or ascites. Lymphatic: No bulky adenopathy. Reproductive: No significant finding by CT. Other: No abdominal wall hernia or abnormality. No abdominopelvic ascites. Musculoskeletal: Degenerative changes noted of the spine. Bones are osteopenic. Healing left rami fractures noted. Bilateral L5 pars defects with chronic anterolisthesis of L5 upon S1 and associated degenerative disc disease. IMPRESSION: VASCULAR No evidence of active arterial GI bleeding by CTA. Abdominal atherosclerosis as detailed above. No other acute intra-abdominopelvic vascular finding. NON-VASCULAR Indeterminate pancreatic head and body cystic lesions as detailed above. Recommend follow-up nonemergent MRI in 3-6 months for further characterization and to determine stability. Small layering pleural effusions and basilar atelectasis Postop changes of gastric bypass and cholecystectomy. Diverticulosis without acute inflammatory process. Healing left rami fractures Electronically Signed   By: Jerilynn Mages.  Shick M.D.   On: 03/27/2020 16:02        Assessment / Plan:    #72 75 year old white male with recurrent GI bleed.  Recent diagnosis of anastomotic ulcer in patient status post remote Roux-en-Y gastrojejunostomy.  EGD yesterday showed the ulcer to still be present though healing, mild friability, and suture ministerial in the vicinity of the ulcer.  Capsule endoscopy is in progress, will be read today  Patient has had continued drift in hemoglobin, down to 7.4 this morning  Plan; capsule endoscopy to be interpreted today, further recommendations pending results.  Will transfuse 2 units of packed RBCs, continue serial hemoglobins Continue twice daily PPI    Principal Problem:   GI bleed Active Problems:   Adrenal insufficiency due to cancer therapy ([place])   Anxiety and depression   Closed fracture of left superior pubic ramus (HCC)   Acute blood loss anemia   Acute gastric ulcer without hemorrhage or perforation     LOS: 2 days   [name] PA-C 03/29/2020, 8:28 AM   GI ATTENDING  Interval history data reviewed.  Capsule endoscopy reviewed.  Patient personally seen and examined.  Daughter in room.  Patient has been clinically stable.  He is anticipating blood transfusions x2.  No evidence for significant recurrent bleeding.  Capsule endoscopy showed subtle abnormalities of the small bowel around the region of the enteroenteric anastomosis and just beyond.  The clinical significance is uncertain.  However, it does require further evaluation with direct endoscopy in the form of enteroscopy.  I have discussed this with the patient and his daughter.  I have provided them with drawings of normal and postoperative anatomy.  I have talked about potential causes for his recurrent  bleeding including the area of get to completely heal gastroenteric ulceration (though no stigmata), pathology in the excluded area of the gut, and pathology in the small bowel to be evaluated tomorrow.The nature of the procedure, as well as the risks,  benefits, and alternatives were carefully and thoroughly reviewed with the patient. Ample time for discussion and questions allowed. The patient understood, was satisfied, and agreed to proceed.  Docia Chuck. Geri Seminole., M.D. [facility] of Gastroenterology

## 2020-03-29 NOTE — Care Management Important Message (Signed)
Important Message  Patient Details IM Letter placed in Patient's room. Name: Maxwell Fox MRN: [id number] Date of Birth: 1945/12/16   Medicare Important Message Given:  Yes     Kerin Salen 03/29/2020, 11:51 AM

## 2020-03-29 NOTE — TOC Progression Note (Signed)
Transition of Care [facility]) - Progression Note    Patient Details  Name: Maxwell Fox MRN: [id number] Date of Birth: 19-Nov-1945  [facility] CM/SW Contact  Koehn Salehi, Juliann Pulse, RN Phone Number: 03/29/2020, 5:06 PM  Clinical Narrative: Patient active w/[facility] for HHPT-rep [facility] following. Patient feels he doesn't need PT to work with him while in hospital.    Expected Discharge Plan: [place] Barriers to Discharge: Continued Medical Work up  Expected Discharge Plan and Services Expected Discharge Plan: [place]   Discharge Planning Services: CM Consult Post Acute Care Choice: [place] (rw;[facility] active-HHPT) Living arrangements for the past 2 months: Single Family Home                                       Social Determinants of Health (SDOH) Interventions    Readmission Risk Interventions Readmission Risk Prevention Plan 02/28/2020  Transportation Screening Complete  PCP or Specialist Appt within 3-5 Days Not Complete  Not Complete comments 2/4 was first available appt  [place] or [place] Complete  Social Work Consult for [place] Planning/Counseling Complete  Palliative Care Screening Not Applicable  Some recent data might be hidden

## 2020-03-29 NOTE — Procedures (Signed)
Video Capsule Endoscopy images reviewed and notable for the following:  - Complete study. Prep mostly adequate, with stool and debris in distal small bowel which degraded full visualization in this area.  - Mucosal friability and healing G-J marginal ulcer with visible surgical staples. This was well visualized on the preceding endoscopy and healing compared with previous endoscopies.  - VCE delivered into enteral limb of small bowel. - Small amounts of blood noted in proximal jejunum. - J-J anastamosis located approximately 25 minutes after G-J anastamosis (and 5 hr 30 minutes proximal to cecum) - Small amounts of blood and areas that seem suspicious for erosion vs AVM at the J-J anastamosis. Very limited views of the biliary limb.  - Possible small nodule in the distal small bowel, located approximately 1 hr 41 min  proximal to the cecum. This is difficult to fully visualize due to stool and debris in the distal small bowel.  - Otherwise, remainder of mid and distal small bowel was normal appearing and no additional areas of bleeding or stigmata of bleeding noted.   Summary and Recommendations: Suspect additional source of bleeding at the jejuno-jejunal anastamosis site. This may be within reach of a push enteroscopy vs device assisted enteroscopy (ie, single balloon enteroscopy).  - Change to clears today with NPO at MN for enteroscopy tomorrow for diagnostic and therapeutic intent - Continue serial Hgb/Hct checks - Can consider surgical staple removal endoscopically to reduce nidus for further marginal ulcer formation - May consider outpatient CT Enterography vs PET scan to evaluate possible distal small bowel nodule. Given close follow-up scheduled at [facility], this can also be coordinated at that facility of more convenient for patient.    Gerrit Heck, DO, [facility]

## 2020-03-29 NOTE — Anesthesia Postprocedure Evaluation (Signed)
Anesthesia Post Note  Patient: Lamarco Gudiel  Procedure(s) Performed: ESOPHAGOGASTRODUODENOSCOPY (EGD) WITH PROPOFOL (N/A ) GIVENS CAPSULE STUDY (N/A )     Patient location during evaluation: PACU Anesthesia Type: MAC Level of consciousness: awake and alert Pain management: pain level controlled Vital Signs Assessment: post-procedure vital signs reviewed and stable Respiratory status: spontaneous breathing, nonlabored ventilation and respiratory function stable Cardiovascular status: blood pressure returned to baseline and stable Postop Assessment: no apparent nausea or vomiting Anesthetic complications: no   No complications documented.  Last Vitals:  Vitals:   03/28/20 2135 03/29/20 0621  BP: 100/60 140/71  Pulse: (!) 56 62  Resp: 18   Temp: 36.7 C 36.6 C  SpO2: 98% 97%    Last Pain:  Vitals:   03/29/20 0932  TempSrc: Oral  PainSc:                  Pervis Hocking

## 2020-03-29 NOTE — Progress Notes (Signed)
PROGRESS NOTE    Maxwell Fox  GYF:[id number] DOB: 03-30-1945 DOA: 03/27/2020 PCP: [name], MD    Brief Narrative:  [name] was admitted to the hospital with the working diagnosis of acute blood loss anemia due to upper GI bleed.   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  He reported a presyncopal episode on the day of admission, associated with orthostatic symptoms.  On his initial physical examination temperature 97.8, blood pressure 99/64, heart rate 59, respiratory rate 20, oxygen saturation 100% on room air.  He was pale, lungs clear to auscultation bilaterally, heart S1-S2, present rhythmic, soft abdomen, no lower extremity edema.  Sodium 141, potassium 4.0, chloride 111, bicarb 22, glucose 80, BUN 22, creatinine 1.0, AST 24, ALT 14, white count 8.1, hemoglobin 7.9, hematocrit 26.4, platelets 207.  Patient received one unit PRBC and was admitted for further evaluation.   EGD with gastric healing ulcers, at the suture site, with friable mucosa but no high risk bleeding stigmata. Capsule endoscopy was deployed in the jejunum.   Persistent worsening Hgb and Hct, plan for 2 more units PRBC, to make a total of 3 units since admission.   Assessment & Plan:   Principal Problem:   GI bleed Active Problems:   Adrenal insufficiency due to cancer therapy (HCC)   Anxiety and depression   Closed fracture of left superior pubic ramus (HCC)   Acute blood loss anemia   Acute gastric ulcer without hemorrhage or perforation    1. Acute blood loss anemia due to upper GI bleed.  EGD with friable mucosa and oozing but no high risk bleeding stigmata.  Hgb down to 7,4 this am with hct at 24,0 and platelets at 155.   Plan to transfuse 2  more units of PRBC for a total since admission of 3 units.   Continue with pantoprazole and follow with results of capsule endoscopy.   2. Central hypothyroid and central adrenal insufficiency, sp brain melanoma resection. On levothyroxine and prednisone.  Patient with stable hemodynamics.   3. Left pubic ramus fracture. On hydrocodone / oxycodone for pain control.   4. Anxiety. Continue with clonazepam and escitalopram with good toleration.    Patient continue to be at high risk for worsening anemia   Status is: Inpatient  Remains inpatient appropriate because:IV treatments appropriate due to intensity of illness or inability to take PO   Dispo: The patient is from: Home              Anticipated d/c is to: Home              Patient currently is not medically stable to d/c.   Difficult to place patient No  DVT prophylaxis: scd   Code Status:   full  Family Communication:  I spoke with patient's daughter at the bedside, we talked in detail about patient's condition, plan of care and prognosis and all questions were addressed.      Consultants:   GI   Procedures:   EGD  Capsule endoscopy       Subjective: Patient is feeling better, but not yet back to baseline, continue to have generalized weakness, no nausea or vomiting, no hematemesis, or hematochezia.   Objective: Vitals:   03/28/20 1408 03/28/20 2135 03/29/20 0621 03/29/20 1215  BP: 126/68 100/60 140/71  117/80  Pulse: (!) 56 (!) 56 62 66  Resp: 16 18  18   Temp: (!) 97.5 F (36.4 C) 98 F (36.7 C) 97.8 F (36.6 C) 98.6 F (37 C)  TempSrc: Oral Oral Oral Oral  SpO2: 100% 98% 97% 99%  Weight:      Height:        Intake/Output Summary (Last 24 hours) at 03/29/2020 1319 Last data filed at 03/29/2020 0500 Gross per 24 hour  Intake 370 ml  Output 750 ml  Net -380 ml   Filed Weights   03/27/20 2206  Weight: 91.6 kg    Examination:   General: Not in pain or dyspnea, deconditioned  Neurology:  Awake and alert, non focal  E ENT: mild pallor, no icterus, oral mucosa moist Cardiovascular: No JVD. S1-S2 present, rhythmic, no gallops, rubs, or murmurs. No lower extremity edema. Pulmonary: positive breath sounds bilaterally, adequate air movement, no wheezing, rhonchi or rales. Gastrointestinal. Abdomen soft and non tender Skin. No rashes Musculoskeletal: no joint deformities     Data Reviewed: I have personally reviewed following labs and imaging studies  CBC: Recent Labs  Lab 03/27/20 1130 03/27/20 1805 03/28/20 0040 03/28/20 1021 03/29/20 0453  WBC 8.1  --  6.1  --  3.7*  HGB 7.9* 8.4* 7.7* 7.9* 7.4*  HCT 26.4* 26.9* 25.1* 25.7* 24.0*  MCV 101.1*  --  97.7  --  97.2  PLT 207  --  148*  --  371   Basic Metabolic Panel: Recent Labs  Lab 03/27/20 1130 03/28/20 0040 03/29/20 0453  NA 141 138 143  K 4.0 4.0 4.3  CL 111 109 111  CO2 22 21* 25  GLUCOSE 80 79 84  BUN 22 24* 16  CREATININE 1.00 0.86 0.91  CALCIUM 8.2* 7.9* 8.2*   GFR: Estimated Creatinine Clearance: 82.4 mL/min (by C-G formula based on SCr of 0.91 mg/dL). Liver Function Tests: Recent Labs  Lab 03/27/20 1130  AST 24  ALT 14  ALKPHOS 62  BILITOT 0.6  PROT 4.8*  ALBUMIN 2.6*   No results for input(s): LIPASE, AMYLASE in the last 168 hours. No results for input(s): AMMONIA in the last 168 hours. Coagulation Profile: No results for input(s): INR, PROTIME in the last 168 hours. Cardiac Enzymes: No results for input(s): CKTOTAL, CKMB, CKMBINDEX, TROPONINI in the last 168 hours. BNP (last 3 results) No results for input(s): PROBNP in the last 8760 hours. HbA1C: No results for input(s): HGBA1C in the last 72 hours. CBG: No results for input(s): GLUCAP in the last 168 hours. Lipid Profile: No results for input(s): CHOL, HDL, LDLCALC, TRIG, CHOLHDL, LDLDIRECT in the last 72 hours. Thyroid Function Tests: No results for input(s): TSH, T4TOTAL, FREET4, T3FREE, THYROIDAB in the last 72  hours. Anemia Panel: No results for input(s): VITAMINB12, FOLATE, FERRITIN, TIBC, IRON, RETICCTPCT in the last 72 hours.    Radiology Studies: I have reviewed all of the imaging during this hospital visit personally     Scheduled Meds: . acetaminophen  650 mg Oral Once  . escitalopram  10 mg Oral Daily  . levothyroxine  88 mcg Oral QAC breakfast  . pantoprazole (PROTONIX) IV  40 mg Intravenous Q12H  . predniSONE  5 mg Oral Q breakfast  . sodium chloride flush  3 mL Intravenous Q12H   Continuous Infusions: . sodium chloride Stopped (03/27/20 1611)     LOS: 2 days        Kadesia Robel Gerome Apley, MD

## 2020-03-30 ENCOUNTER — Inpatient Hospital Stay (HOSPITAL_COMMUNITY): Payer: Medicare Other | Admitting: Certified Registered Nurse Anesthetist

## 2020-03-30 ENCOUNTER — Inpatient Hospital Stay (HOSPITAL_COMMUNITY): Payer: Medicare Other

## 2020-03-30 ENCOUNTER — Encounter (HOSPITAL_COMMUNITY): Payer: Self-pay | Admitting: Internal Medicine

## 2020-03-30 ENCOUNTER — Encounter (HOSPITAL_COMMUNITY)
Admission: EM | Disposition: A | Discharge status: Home / Self Care | Payer: Self-pay | Source: Home / Self Care | Attending: Internal Medicine

## 2020-03-30 DIAGNOSIS — K254 Chronic or unspecified gastric ulcer with hemorrhage: Principal | ICD-10-CM

## 2020-03-30 DIAGNOSIS — T182XXA Foreign body in stomach, initial encounter: Secondary | ICD-10-CM

## 2020-03-30 HISTORY — PX: ENTEROSCOPY: SHX5533

## 2020-03-30 HISTORY — PX: FOREIGN BODY REMOVAL: SHX962

## 2020-03-30 HISTORY — PX: ESOPHAGOGASTRODUODENOSCOPY: SHX5428

## 2020-03-30 HISTORY — PX: HEMOSTASIS CONTROL: SHX6838

## 2020-03-30 LAB — CBC
HCT: 30.8 % — ABNORMAL LOW (ref 39.0–52.0)
Hemoglobin: 10 g/dL — ABNORMAL LOW (ref 13.0–17.0)
MCH: 30.2 pg (ref 26.0–34.0)
MCHC: 32.5 g/dL (ref 30.0–36.0)
MCV: 93.1 fL (ref 80.0–100.0)
Platelets: 152 10*3/uL (ref 150–400)
RBC: 3.31 MIL/uL — ABNORMAL LOW (ref 4.22–5.81)
RDW: 16.7 % — ABNORMAL HIGH (ref 11.5–15.5)
WBC: 5.5 10*3/uL (ref 4.0–10.5)
nRBC: 0 % (ref 0.0–0.2)

## 2020-03-30 LAB — TYPE AND SCREEN
ABO/RH(D): A POS
Antibody Screen: NEGATIVE
Unit division: 0
Unit division: 0
Unit division: 0

## 2020-03-30 LAB — BPAM RBC
Blood Product Expiration Date: 202203102359
Blood Product Expiration Date: 202203122359
Blood Product Expiration Date: 202203162359
ISSUE DATE / TIME: 202202231329
ISSUE DATE / TIME: 202202251203
ISSUE DATE / TIME: 202202251606
Unit Type and Rh: 600
Unit Type and Rh: 6200
Unit Type and Rh: 6200

## 2020-03-30 SURGERY — ENTEROSCOPY
Anesthesia: Monitor Anesthesia Care

## 2020-03-30 MED ORDER — PROPOFOL 500 MG/50ML IV EMUL
INTRAVENOUS | Status: AC
  Filled 2020-03-30: qty 50

## 2020-03-30 MED ORDER — SUCRALFATE 1 GM/10ML PO SUSP
1.0000 g | Freq: Three times a day (TID) | ORAL | Status: DC
  Administered 2020-03-30 – 2020-03-31 (×3): 1 g via ORAL
  Filled 2020-03-30 (×3): qty 10

## 2020-03-30 MED ORDER — FENTANYL CITRATE (PF) 100 MCG/2ML IJ SOLN
INTRAMUSCULAR | Status: AC
  Filled 2020-03-30: qty 2

## 2020-03-30 MED ORDER — LACTATED RINGERS IV SOLN: INTRAVENOUS | Status: DC | PRN

## 2020-03-30 MED ORDER — POLYETHYLENE GLYCOL 3350 17 G PO PACK
17.0000 g | PACK | Freq: Every day | ORAL | Status: DC
  Administered 2020-03-31 – 2020-04-01 (×2): 17 g via ORAL
  Filled 2020-03-30 (×3): qty 1

## 2020-03-30 MED ORDER — SODIUM CHLORIDE (PF) 0.9 % IJ SOLN
PREFILLED_SYRINGE | INTRAMUSCULAR | Status: DC | PRN
  Administered 2020-03-30: 8.5 mL

## 2020-03-30 MED ORDER — PROPOFOL 10 MG/ML IV BOLUS
INTRAVENOUS | Status: AC
  Filled 2020-03-30: qty 20

## 2020-03-30 MED ORDER — LIDOCAINE 2% (20 MG/ML) 5 ML SYRINGE
INTRAMUSCULAR | Status: DC | PRN
  Administered 2020-03-30: 100 mg via INTRAVENOUS

## 2020-03-30 MED ORDER — ONDANSETRON HCL 4 MG/2ML IJ SOLN
INTRAMUSCULAR | Status: DC | PRN
  Administered 2020-03-30: 4 mg via INTRAVENOUS

## 2020-03-30 MED ORDER — PROPOFOL 500 MG/50ML IV EMUL
INTRAVENOUS | Status: DC | PRN
  Administered 2020-03-30: 100 ug/kg/min via INTRAVENOUS

## 2020-03-30 MED ORDER — PROPOFOL 10 MG/ML IV BOLUS
INTRAVENOUS | Status: DC | PRN
  Administered 2020-03-30: 20 mg via INTRAVENOUS
  Administered 2020-03-30 (×2): 10 mg via INTRAVENOUS
  Administered 2020-03-30: 20 mg via INTRAVENOUS
  Administered 2020-03-30: 10 mg via INTRAVENOUS

## 2020-03-30 NOTE — Progress Notes (Addendum)
Patient ID: Maxwell Fox, male   DOB: 1945-08-29, 75 y.o.   MRN: [id number]    Progress Note   Subjective   Day # 3 CC; recurrent GI bleeding  HGB  up to 10 status post 2 units packed RBCs yesterday  1 dark black bowel movement  Patient feels better post transfusions, no complaints of abdominal discomfort.  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 Normal bowel sounds, midline scar Extremities:  Without edema. Neurologic:  Alert and oriented,  grossly normal neurologically. Psych:  Cooperative. Normal mood and affect.  Intake/Output from previous day: 02/25 0701 - 02/26 0700 In: 765.8 [Blood:765.8] Out: 1025 [Urine:1025] Intake/Output this shift: No intake/output data recorded.  Lab Results: Recent Labs    03/28/20 0040 03/28/20 1021 03/29/20 0453 03/30/20 0408  WBC 6.1  --  3.7* 5.5  HGB 7.7* 7.9* 7.4* 10.0*  HCT 25.1* 25.7* 24.0* 30.8*  PLT 148*  --  155 152   BMET Recent Labs    03/27/20 1130 03/28/20 0040 03/29/20 0453  NA 141 138 143  K 4.0 4.0 4.3  CL 111 109 111  CO2 22 21* 25  GLUCOSE 80 79 84  BUN 22 24* 16  CREATININE 1.00 0.86 0.91  CALCIUM 8.2* 7.9* 8.2*   LFT Recent Labs    03/27/20 1130  PROT 4.8*  ALBUMIN 2.6*  AST 24  ALT 14  ALKPHOS 62  BILITOT 0.6   PT/INR No results for input(s): LABPROT, INR in the last 72 hours.       Assessment / Plan:    #22 75 year old white male with recurrent GI bleeding Recent admission with GI bleed found secondary to anastomotic ulcer in patient status post Roux-en-Y gastrojejunostomy.  Repeat EGD this admission showed the ulcer to still be present though healing there was mild friability, some suture material in the vicinity of the ulcer.  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  #2 anemia secondary to GI blood loss, stable status post 2 units yesterday #3 history of metastatic melanoma to the brain status post excision, and immunotherapy 2017 2018  Plan; continue to trend hemoglobin transfuse as indicated Patient is scheduled for enteroscopy with Dr. Henrene Pastor today, further recommendations pending findings.  Patient has follow-up scheduled with his oncologist at [facility] on Monday.  He is due for surveillance imaging.  Principal Problem:   GI bleed Active Problems:   Adrenal insufficiency due to cancer therapy (HCC)   Anxiety and depression   Closed fracture of left superior pubic ramus (HCC)   Acute blood loss anemia   Acute gastric ulcer without hemorrhage or perforation   Abnormal x-ray of small bowel     LOS: 3 days   Amy EsterwoodPA-C  03/30/2020, 9:49 AM   GI ATTENDING  Interval history and data reviewed.  Agree with interval  progress note.  Patient for enteroscopy today to rule out significant small bowel pathology contributing to recurrent GI bleeding.  Subtle abnormalities on capsule endoscopy as previously described.The nature of the procedure, as well as the risks, benefits, and alternatives were carefully and thoroughly reviewed with the patient. Ample time for discussion and questions allowed. The patient understood, was satisfied, and agreed to proceed.  Docia Chuck. Geri Seminole., M.D. [facility] of Gastroenterology

## 2020-03-30 NOTE — Transfer of Care (Signed)
Immediate Anesthesia Transfer of Care Note  Patient: Maxwell Fox  Procedure(s) Performed: ENTEROSCOPY (N/A ) FOREIGN BODY REMOVAL HEMOSTASIS CONTROL  Patient Location: Endoscopy Unit  Anesthesia Type:MAC  Level of Consciousness: drowsy and patient cooperative  Airway & Oxygen Therapy: Patient Spontanous Breathing and Patient connected to face mask oxygen  Post-op Assessment: Report given to RN and Post -op Vital signs reviewed and stable  Post vital signs: Reviewed and stable  Last Vitals:  Vitals Value Taken Time  BP 159/71 03/30/20 1347  Temp    Pulse 69 03/30/20 1348  Resp 29 03/30/20 1348  SpO2 96 % 03/30/20 1348  Vitals shown include unvalidated device data.  Last Pain:  Vitals:   03/30/20 1230  TempSrc:   PainSc: 0-No pain      Patients Stated Pain Goal: 3 (83/66/29 4765)  Complications: No complications documented.

## 2020-03-30 NOTE — Op Note (Signed)
[facility] Patient Name: Maxwell Fox Procedure Date: 03/30/2020 MRN: [id number] Attending MD: Docia Chuck. Henrene Pastor , MD Date of Birth: Jan 31, 1946 CSN: [id number] Age: 75 Admit Type: Inpatient Procedure:                Upper GI endoscopy with stable removal and control                            of bleeding Indications:              Melena Providers:                Docia Chuck. Henrene Pastor, MD, Kary Kos RN, RN, Lesia Sago, Technician, Adair Laundry, CRNA Referring MD:             [facility] hospital[facility] Medicines:                Monitored Anesthesia Care Complications:            No immediate complications. Estimated Blood Loss:     Estimated blood loss: none. Procedure:                Pre-Anesthesia Assessment:                           - Prior to the procedure, a History and Physical                            was performed, and patient medications and                            allergies were reviewed. The patient's tolerance of                            previous anesthesia was also reviewed. The risks                            and benefits of the procedure and the sedation                            options and risks were discussed with the patient.                            All questions were answered, and informed consent                            was obtained. Prior Anticoagulants: The patient has                            taken no previous anticoagulant or antiplatelet                            agents. ASA Grade Assessment: III - A patient with  severe systemic disease. After reviewing the risks                            and benefits, the patient was deemed in                            satisfactory condition to undergo the procedure.                           After obtaining informed consent, the endoscope was                            passed under direct vision. Throughout the                             procedure, the patient's blood pressure, pulse, and                            oxygen saturations were monitored continuously. The                            [id number]-[id number] ([id number] Olympus gastroscope was                            introduced through the mouth, and advanced to the                            jejunum. The upper GI endoscopy was accomplished                            without difficulty. The patient tolerated the                            procedure well. Findings:      The esophagus was normal.      The Roux-en-Y gastric bypass anatomy noted.      There was friable mucosa and oozing at the area of previous ulcers.       Particularly in the region of prior stapling. At this point, this was       felt highly likely the cause for nonhealing ulceration and recurrent       bleeding.      The area of oozing was injected with epinephrine 1-10,000 solution. The       previous staples were removed with Endo scissors. There was "tissue       release" with fleshy bleeding tissue. Additional epinephrine was       injected (a total of 8.5 mile throughout the entire procedure use).       There was still some residual oozing which was treated with hemospray.       This resulted in hemostasis. Impression:               1. Status post Roux-en-Y gastric bypass surgery                            with recurrent GI bleeding secondary to anastomotic  ulceration. Nonhealing anastomotic ulceration with                            recurrent bleeding felt most likely due to embedded                            foreign body material. The surgical staples were                            removed with endoscissors. Bleeding in the area of                            concern was treated with a combination of                            adrenaline injection and hemospray, as described.Marland Kitchen                           NOTE: Patient is complaining of epigastric pain                             post procedure. Possibilities include increased                            air, discomfort from epinephrine injection, or                            perforation (though no obvious perforation noted) Moderate Sedation:      none Recommendation:           1. NPO for now                           2. Close observation. If he continues to complain                            of pain, will require x-rays                           3. Monitor for recurrent bleeding.                           4. Transfuse as needed                           Discussed all of the above in detail with the                            patient and the patient's daughter Maxwell Fox                            [phone] Procedure Code(s):        --- Professional ---                           43255, Esophagogastroduodenoscopy, flexible,  transoral; with control of bleeding, any method Diagnosis Code(s):        --- Professional ---                           K25.9, Gastric ulcer, unspecified as acute or                            chronic, without hemorrhage or perforation                           K92.1, Melena (includes Hematochezia) CPT copyright 2019 American [employer] Association. All rights reserved. The codes documented in this report are preliminary and upon coder review may  be revised to meet current compliance requirements. Docia Chuck. Henrene Pastor, MD 03/30/2020 2:41:10 PM This report has been signed electronically. Number of Addenda: 0

## 2020-03-30 NOTE — Progress Notes (Signed)
PROGRESS NOTE    Maxwell Fox  JHE:[id number] DOB: [birth year] DOA: 03/27/2020 PCP: [name], MD    Brief Narrative:  [name] was admitted to the hospital with the working diagnosis of acute blood loss anemia due to upper GI bleed.   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 He reported a presyncopal episode on the day of admission, associated with orthostatic symptoms. On his initial physical examination temperature 97.8, blood pressure 99/64, heart rate 59, respiratory rate 20, oxygen saturation 100% on room air. He was pale, lungs clear to auscultation bilaterally, heart S1-S2, present rhythmic, soft abdomen, no lower extremity edema.  Sodium 141, potassium 4.0, chloride 111, bicarb 22, glucose 80, BUN 22, creatinine 1.0, AST 24, ALT 14, white count 8.1, hemoglobin 7.9, hematocrit 26.4, platelets 207.  Patient received one unit PRBC and was admitted for further evaluation.  EGD with gastric healing ulcers, at the suture site, with friable mucosa but no high risk bleeding stigmata. Capsule endoscopy was deployed in the jejunum.  Persistent worsening Hgb and Hct, plan for 2 more units PRBC, to make a total of 3 units since admission.   Capsule endoscopy showed erosive changes and possible AVM at the jejuno-jejunal anastomosis with oozing.   Plan for further work up with push enteroscopy.   Assessment & Plan:   Principal Problem:   GI bleed Active Problems:   Adrenal insufficiency due to cancer therapy (HCC)   Anxiety and depression   Closed fracture of left superior pubic ramus (HCC)   Acute blood loss anemia   Acute gastric ulcer without hemorrhage or perforation   Abnormal x-ray of small bowel    1. Acute blood  loss anemia due to upper GI bleed.  sp 3 units PRBC EGD with friable mucosa and oozing but no high risk bleeding stigmata.  Capsule endoscopy with erosive changes at the jejuno-jejunal anastomosis possible AVM   No melena, his hgb this am is 10.0 and hct at 30,8 with platelets at 152. On pantoprazole with good toleration. Follow with recommendations post entrosocopy.  Check H&H in am.   2. Central hypothyroid and central adrenal insufficiency, sp brain melanoma resection. Continue with levothyroxine and prednisone.  No clinical signs of decompensation.    3. Left pubic ramus fracture. Continue with hydrocodone / oxycodone for pain control.  Add bowel regimen with Miralax to prevent opioid induced hypotension.   Consult PT, OT and nutrition.   4. Anxiety. On clonazepam and escitalopram with good toleration.    Status is: Inpatient  Remains inpatient appropriate because:Inpatient level of care appropriate due to severity of illness   Dispo: The patient is from: Home              Anticipated d/c is to: Home              Patient currently is not medically stable to d/c.   Difficult to place patient No    DVT prophylaxis: scd   Code Status:   full  Family Communication:  No family at the bedside at the time of my visit.       Consultants:   GI   Procedures:   EGD     Subjective: Patient is feeling well, but continue to be very weak and deconditioned, not yet back to baseline.   Objective: Vitals:  03/29/20 1634 03/29/20 1815 03/29/20 2300 03/30/20 0609  BP: 125/78 109/70 124/73 (!) 145/80  Pulse: 60 (!) 59 60 (!) 57  Resp: 18 18 15 16   Temp: 98 F (36.7 C) 98.1 F (36.7 C) 97.6 F (36.4 C) 98.2 F (36.8 C)  TempSrc: Oral Oral Oral Oral  SpO2: 100% 100% 96% 100%  Weight:      Height:        Intake/Output Summary (Last 24 hours) at 03/30/2020 1054 Last data filed at 03/30/2020 6237 Gross per 24 hour  Intake 765.83 ml  Output 1025 ml  Net -259.17  ml   Filed Weights   03/27/20 2206  Weight: 91.6 kg    Examination:   General: Not in pain or dyspnea, deconditioned  Neurology: Awake and alert, non focal  E ENT: mild pallor, no icterus, oral mucosa moist Cardiovascular: No JVD. S1-S2 present, rhythmic, no gallops, rubs, or murmurs. No lower extremity edema. Pulmonary: positive breath sounds bilaterally, with wheezing, rhonchi or rales. Gastrointestinal. Abdomen soft and non tender Skin. No rashes Musculoskeletal: no joint deformities     Data Reviewed: I have personally reviewed following labs and imaging studies  CBC: Recent Labs  Lab 03/27/20 1130 03/27/20 1805 03/28/20 0040 03/28/20 1021 03/29/20 0453 03/30/20 0408  WBC 8.1  --  6.1  --  3.7* 5.5  HGB 7.9* 8.4* 7.7* 7.9* 7.4* 10.0*  HCT 26.4* 26.9* 25.1* 25.7* 24.0* 30.8*  MCV 101.1*  --  97.7  --  97.2 93.1  PLT 207  --  148*  --  155 628   Basic Metabolic Panel: Recent Labs  Lab 03/27/20 1130 03/28/20 0040 03/29/20 0453  NA 141 138 143  K 4.0 4.0 4.3  CL 111 109 111  CO2 22 21* 25  GLUCOSE 80 79 84  BUN 22 24* 16  CREATININE 1.00 0.86 0.91  CALCIUM 8.2* 7.9* 8.2*   GFR: Estimated Creatinine Clearance: 82.4 mL/min (by C-G formula based on SCr of 0.91 mg/dL). Liver Function Tests: Recent Labs  Lab 03/27/20 1130  AST 24  ALT 14  ALKPHOS 62  BILITOT 0.6  PROT 4.8*  ALBUMIN 2.6*   No results for input(s): LIPASE, AMYLASE in the last 168 hours. No results for input(s): AMMONIA in the last 168 hours. Coagulation Profile: No results for input(s): INR, PROTIME in the last 168 hours. Cardiac Enzymes: No results for input(s): CKTOTAL, CKMB, CKMBINDEX, TROPONINI in the last 168 hours. BNP (last 3 results) No results for input(s): PROBNP in the last 8760 hours. HbA1C: No results for input(s): HGBA1C in the last 72 hours. CBG: No results for input(s): GLUCAP in the last 168 hours. Lipid Profile: No results for input(s): CHOL, HDL, LDLCALC,  TRIG, CHOLHDL, LDLDIRECT in the last 72 hours. Thyroid Function Tests: No results for input(s): TSH, T4TOTAL, FREET4, T3FREE, THYROIDAB in the last 72 hours. Anemia Panel: No results for input(s): VITAMINB12, FOLATE, FERRITIN, TIBC, IRON, RETICCTPCT in the last 72 hours.    Radiology Studies: I have reviewed all of the imaging during this hospital visit personally     Scheduled Meds: . acetaminophen  650 mg Oral Once  . escitalopram  10 mg Oral Daily  . levothyroxine  88 mcg Oral QAC breakfast  . pantoprazole (PROTONIX) IV  40 mg Intravenous Q12H  . predniSONE  5 mg Oral Q breakfast  . sodium chloride flush  3 mL Intravenous Q12H   Continuous Infusions: . sodium chloride Stopped (03/27/20 1611)     LOS: 3  days        Ceri Mayer Gerome Apley, MD

## 2020-03-30 NOTE — Anesthesia Postprocedure Evaluation (Signed)
Anesthesia Post Note  Patient: Maxwell Fox  Procedure(s) Performed: ENTEROSCOPY (N/A ) FOREIGN BODY REMOVAL HEMOSTASIS CONTROL ESOPHAGOGASTRODUODENOSCOPY (EGD) (N/A )     Patient location during evaluation: PACU Anesthesia Type: MAC Level of consciousness: awake and alert Pain management: pain level controlled Vital Signs Assessment: post-procedure vital signs reviewed and stable Respiratory status: spontaneous breathing, nonlabored ventilation, respiratory function stable and patient connected to nasal cannula oxygen Cardiovascular status: stable and blood pressure returned to baseline Postop Assessment: no apparent nausea or vomiting Anesthetic complications: no   No complications documented.  Last Vitals:  Vitals:   03/30/20 1620 03/30/20 1636  BP: (!) 170/63 140/73  Pulse: (!) 52 61  Resp: 20 20  Temp:  (!) 36.4 C  SpO2: 93% 100%    Last Pain:  Vitals:   03/30/20 2001  TempSrc:   PainSc: 9                  Tiajuana Amass

## 2020-03-30 NOTE — Anesthesia Preprocedure Evaluation (Addendum)
Anesthesia Evaluation  Patient identified by MRN, date of birth, ID band Patient awake    Reviewed: Allergy & Precautions, NPO status , Patient's Chart, lab work & pertinent test results  Airway Mallampati: II  TM Distance: >3 FB Neck ROM: Full    Dental  (+) Dental Advisory Given   Pulmonary former smoker,    breath sounds clear to auscultation       Cardiovascular hypertension,  Rhythm:Regular Rate:Normal     Neuro/Psych negative neurological ROS     GI/Hepatic Neg liver ROS, PUD, S/p gastric bypass   Endo/Other  Hypothyroidism   Renal/GU negative Renal ROS     Musculoskeletal   Abdominal   Peds  Hematology  (+) anemia ,   Anesthesia Other Findings   Reproductive/Obstetrics                             Anesthesia Physical Anesthesia Plan  ASA: III  Anesthesia Plan: MAC   Post-op Pain Management:    Induction:   PONV Risk Score and Plan: 1 and Propofol infusion and Treatment may vary due to age or medical condition  Airway Management Planned: Natural Airway and Nasal Cannula  Additional Equipment:   Intra-op Plan:   Post-operative Plan:   Informed Consent: I have reviewed the patients History and Physical, chart, labs and discussed the procedure including the risks, benefits and alternatives for the proposed anesthesia with the patient or authorized representative who has indicated his/her understanding and acceptance.       Plan Discussed with:   Anesthesia Plan Comments:        Anesthesia Quick Evaluation

## 2020-03-30 NOTE — Op Note (Signed)
[facility] Patient Name: Maxwell Fox Procedure Date: 03/30/2020 MRN: [id number] Attending MD: Docia Chuck. Henrene Pastor , MD Date of Birth: [birth year] CSN: [id number] Age: 75 Admit Type: Inpatient Procedure:                Small bowel enteroscopy Indications:              GI bleeding source not documented by previous EGD                            and colonoscopy. Capsule endoscopy yesterday                            suggested possible small bowel pathology near the                            enteroenteric anastomosis as well as areas just                            proximal and beyond. Providers:                Docia Chuck. Henrene Pastor, MD, Kary Kos RN, RN, Lesia Sago, Technician Referring MD:             [facility] hospitalist Medicines:                Monitored Anesthesia Care Complications:            No immediate complications. Estimated Blood Loss:     Estimated blood loss: none. Procedure:                Pre-Anesthesia Assessment:                           - Prior to the procedure, a History and Physical                            was performed, and patient medications and                            allergies were reviewed. The patient's tolerance of                            previous anesthesia was also reviewed. The risks                            and benefits of the procedure and the sedation                            options and risks were discussed with the patient.                            All questions were answered, and informed consent  was obtained. Prior Anticoagulants: The patient has                            taken no previous anticoagulant or antiplatelet                            agents. ASA Grade Assessment: III - A patient with                            severe systemic disease. After reviewing the risks                            and benefits, the patient was deemed in                             satisfactory condition to undergo the procedure.                           After obtaining informed consent, the endoscope was                            passed under direct vision. Throughout the                            procedure, the patient's blood pressure, pulse, and                            oxygen saturations were monitored continuously. The                            [id number]-[id number] ([id number] Olympus pediatric colonscope                            was introduced through the mouth and advanced to                            the distal jejunum. The small bowel enteroscopy was                            accomplished without difficulty. The patient                            tolerated the procedure well. Scope In: Scope Out: Findings:      The esophagus was normal.      The stomach revealed postoperative anatomy consistent with prior       Roux-en-Y gastric bypass. The previously noted areas of ulceration on       the small bowel side of the anastomosis were again present. This was       friable and oozing. Suture material away from the area of oozing and       staples within the area of oozing.      The small bowel beyond this region was carefully examined as well as the       enteroenteric anastomosis and the small bowel beyond to the  length of       the endoscope. No bleeding or pathology identified. Impression:               1. Status post Roux-en-Y gastric bypass surgery                           2. Normal small bowel beyond the gastroenteric                            anastomosis as described                           3. Friability and oozing at the area of ulceration                            with embedded staples and adjacent suture material. Recommendation:           1. Attempted endoscopic removal of the staple                            material which may allow this area to more properly                            heal and prevent recurrent bleeding as has been the                             case over the past month plus                           2. Switch to upper endoscope. See that report. Procedure Code(s):        --- Professional ---                           [phone], Small intestinal endoscopy, enteroscopy                            beyond second portion of duodenum, not including                            ileum; diagnostic, including collection of                            specimen(s) by brushing or washing, when performed                            (separate procedure) Diagnosis Code(s):        --- Professional ---                           K92.2, Gastrointestinal hemorrhage, unspecified CPT copyright 2019 American Medical Association. All rights reserved. The codes documented in this report are preliminary and upon coder review may  be revised to meet current compliance requirements. Docia Chuck. Henrene Pastor, MD 03/30/2020 2:13:45 PM This report has been signed electronically. Number of Addenda: 0

## 2020-03-31 DIAGNOSIS — S32512D Fracture of superior rim of left pubis, subsequent encounter for fracture with routine healing: Secondary | ICD-10-CM

## 2020-03-31 DIAGNOSIS — T182XXA Foreign body in stomach, initial encounter: Secondary | ICD-10-CM

## 2020-03-31 LAB — CBC
HCT: 34.7 % — ABNORMAL LOW (ref 39.0–52.0)
Hemoglobin: 11 g/dL — ABNORMAL LOW (ref 13.0–17.0)
MCH: 29.7 pg (ref 26.0–34.0)
MCHC: 31.7 g/dL (ref 30.0–36.0)
MCV: 93.8 fL (ref 80.0–100.0)
Platelets: 159 10*3/uL (ref 150–400)
RBC: 3.7 MIL/uL — ABNORMAL LOW (ref 4.22–5.81)
RDW: 15.9 % — ABNORMAL HIGH (ref 11.5–15.5)
WBC: 8.7 10*3/uL (ref 4.0–10.5)
nRBC: 0 % (ref 0.0–0.2)

## 2020-03-31 LAB — HEMOGLOBIN AND HEMATOCRIT, BLOOD
HCT: 33.6 % — ABNORMAL LOW (ref 39.0–52.0)
Hemoglobin: 10.8 g/dL — ABNORMAL LOW (ref 13.0–17.0)

## 2020-03-31 MED ORDER — ENSURE ENLIVE PO LIQD: 237.0000 mL | Freq: Two times a day (BID) | ORAL | Status: DC

## 2020-03-31 MED ORDER — SUCRALFATE 1 GM/10ML PO SUSP
1.0000 g | Freq: Three times a day (TID) | ORAL | Status: DC
  Administered 2020-03-31 – 2020-04-01 (×3): 1 g via ORAL
  Filled 2020-03-31 (×3): qty 10

## 2020-03-31 MED ORDER — SUCRALFATE 1 GM/10ML PO SUSP
1.0000 g | Freq: Every day | ORAL | Status: DC
  Administered 2020-03-31: 1 g via ORAL
  Filled 2020-03-31: qty 10

## 2020-03-31 MED ORDER — DEXTROSE IN LACTATED RINGERS 5 % IV SOLN: INTRAVENOUS | Status: DC

## 2020-03-31 MED ORDER — SODIUM CHLORIDE 0.9 % IV BOLUS
500.0000 mL | Freq: Once | INTRAVENOUS | Status: AC
  Administered 2020-03-31: 500 mL via INTRAVENOUS

## 2020-03-31 NOTE — Progress Notes (Signed)
Initial Nutrition Assessment  DOCUMENTATION CODES:   Not applicable  INTERVENTION:  Provide Ensure Enlive po BID, each supplement provides 350 kcal and 20 grams of protein  Encourage adequate PO intake.   NUTRITION DIAGNOSIS:   Increased nutrient needs related to acute illness as evidenced by estimated needs.  GOAL:   Patient will meet greater than or equal to 90% of their needs  MONITOR:   PO intake,Supplement acceptance,Skin,Weight trends,Labs,I & O's,Diet advancement  REASON FOR ASSESSMENT:   Consult Assessment of nutrition requirement/status  ASSESSMENT:   75 yo male, sp gastric bypass surgery, with history of recurrent gastrointestinal bleeding, left superior pubic ramus fracture, metastatic melanoma to the brain status post resection/chemotherapy acquired central hypothyroidism and central renal sufficiency, who presents with melena. Capsule endoscopy with erosive changes at the jejuno-jejunal anastomosis possible AVM. Enteroscopy showed non healing anastomotic ulceration with recurrent bleeding likely due to embedded foreign material. Surgical staples were removed  Pt unavailable during attempted time of contact. Diet has been advanced to a full liquid diet. Pt has been tolerating his PO. RD to order nutritional supplements to aid in caloric and protein needs. Pt with no weight loss per weight records. Unable to complete Nutrition-Focused physical exam at this time.   Labs and medications reviewed.   Diet Order:   Diet Order            Diet full liquid Room service appropriate? Yes; Fluid consistency: Thin  Diet effective now                 EDUCATION NEEDS:   Not appropriate for education at this time  Skin:  Skin Assessment: Reviewed RN Assessment  Last BM:  2/26  Height:   Ht Readings from Last 1 Encounters:  03/27/20 5\' 11"  (1.803 m)    Weight:   Wt Readings from Last 1 Encounters:  03/27/20 91.6 kg    BMI:  Body mass index is 28.17  kg/m.  Estimated Nutritional Needs:   Kcal:  2200-2400  Protein:  110-120 grams  Fluid:  >/= 2 L/day  Corrin Parker, MS, RD, LDN RD pager number/after hours weekend pager number on Amion.

## 2020-03-31 NOTE — Progress Notes (Signed)
PROGRESS NOTE    Maxwell Fox  RJJ:[id number] DOB: 1945-10-23 DOA: 03/27/2020 PCP: [name], MD    Brief Narrative:  Maxwell Fox was admitted to the hospital with the working diagnosis of acute blood loss anemia due to upper GI bleed.   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 He reported a presyncopal episode on the day of admission, associated with orthostatic symptoms. On his initial physical examination temperature 97.8, blood pressure 99/64, heart rate 59, respiratory rate 20, oxygen saturation 100% on room air. He was pale, lungs clear to auscultation bilaterally, heart S1-S2, present rhythmic, soft abdomen, no lower extremity edema.  Sodium 141, potassium 4.0, chloride 111, bicarb 22, glucose 80, BUN 22, creatinine 1.0, AST 24, ALT 14, white count 8.1, hemoglobin 7.9, hematocrit 26.4, platelets 207.  Patient received one unit PRBC and was admitted for further evaluation.  2/24 EGD with gastric healing ulcers, at the suture site, with friable mucosa but no high risk bleeding stigmata. Capsule endoscopy was deployed in the jejunum.  Persistent worsening Hgb and Hct, plan for 2 more units PRBC, to make a total of 3 units since admission.  Capsule endoscopy showed erosive changes and possible AVM at the jejuno-jejunal anastomosis with oozing.   2/26 Further work up with push enteroscopy showed non healing anastomotic ulceration with recurrent bleeding likely due to embedded foreign material. Surgical staples were removed.    Assessment & Plan:   Principal Problem:   GI bleed Active Problems:   Adrenal insufficiency due to cancer therapy (HCC)   Anxiety and depression   Closed fracture of left superior pubic ramus (HCC)    Acute blood loss anemia   Acute gastric ulcer without hemorrhage or perforation   Abnormal x-ray of small bowel    1. Acute blood loss anemia due to upper GI bleed. sp 3 units PRBC EGD with friable mucosa and oozing but no high risk bleeding stigmata. Capsule endoscopy with erosive changes at the jejuno-jejunal anastomosis possible AVM. Enteroscopy showed non healing anastomotic ulceration with recurrent bleeding likely due to embedded foreign material. Surgical staples were removed  Follow abdominal films with no perforation. Plan to advance diet to soft, continue antiacid therapy with pantoprazole IV bid and sucralfate qid.  Follow on cbc in am.  Pain control with hydromorphone Iv as needed.   2. New left lower lobe infiltrate, possible aspiration pneumonitis vs atelectasis.  Oxygenation is 94% on room air, no cough or dyspnea, no leukocytosis.   Plan to continue oxymetry monitoring and airway clearing techniques with incentive spirometer. Continue to encourage mobility, out of bed to chair tid with meals.  Follow up chest film and cell count in am, for now will hold on antibiotic therapy.   2. Central hypothyroid and central adrenal insufficiency, sp brain melanoma resection.On levothyroxine and prednisone, with good toleration.   3. Left pubic ramus fracture.On hydrocodone / oxycodone for pain control. Bowel regimen with miralax.   Continue to encourage mobility, PT and OT    4. Anxiety.Continue with clonazepam and escitalopram with good toleration.   Patient continue to be at high risk for worsening anemia   Status is: Inpatient  Remains inpatient appropriate because:IV treatments appropriate due to intensity of illness or inability to take PO   Dispo: The patient is from: Home  Anticipated d/c is to: Home              Patient currently is not medically stable to d/c.   Difficult to place patient No   DVT prophylaxis: scd   Code Status:   full   Family Communication:  I spoke with patient's daugher at the bedside, we talked in detail about patient's condition, plan of care and prognosis and all questions were addressed.      Consultants:   GI   Procedures:   EGD  Capsule   Enteroscopy      Subjective: Patient is feeling better today, he had abdominal pain yesterday that today has improved with analgesics, no nausea or vomiting, no dyspnea or chest pain,   Objective: Vitals:   03/30/20 1620 03/30/20 1636 03/30/20 2236 03/31/20 0536  BP: (!) 170/63 140/73 111/61 104/64  Pulse: (!) 52 61 73 72  Resp: 20 20 20    Temp:  (!) 97.5 F (36.4 C) 98.9 F (37.2 C) 98 F (36.7 C)  TempSrc:  Oral Oral Oral  SpO2: 93% 100% 94% 96%  Weight:      Height:        Intake/Output Summary (Last 24 hours) at 03/31/2020 0947 Last data filed at 03/30/2020 1955 Gross per 24 hour  Intake 400 ml  Output 125 ml  Net 275 ml   Filed Weights   03/27/20 2206  Weight: 91.6 kg    Examination:   General: Not in pain or dyspnea. Deconditioned  Neurology: Awake and alert, non focal  E ENT: mild pallor, no icterus, oral mucosa moist Cardiovascular: No JVD. S1-S2 present, rhythmic, no gallops, rubs, or murmurs. No lower extremity edema. Pulmonary: positive breath sounds bilaterally, with no wheezing, rhonchi or rales. Gastrointestinal. Abdomen mild distended, but no tender Skin. No rashes Musculoskeletal: no joint deformities     Data Reviewed: I have personally reviewed following labs and imaging studies  CBC: Recent Labs  Lab 03/27/20 1130 03/27/20 1805 03/28/20 0040 03/28/20 1021 03/29/20 0453 03/30/20 0408 03/31/20 0517  WBC 8.1  --  6.1  --  3.7* 5.5 8.7  HGB 7.9*   < > 7.7* 7.9* 7.4* 10.0* 11.0*  HCT 26.4*   < > 25.1* 25.7* 24.0* 30.8* 34.7*  MCV 101.1*  --  97.7  --  97.2 93.1 93.8  PLT 207  --  148*  --  155 152 159   < > = values in this interval not displayed.   Basic Metabolic Panel: Recent Labs  Lab  03/27/20 1130 03/28/20 0040 03/29/20 0453  NA 141 138 143  K 4.0 4.0 4.3  CL 111 109 111  CO2 22 21* 25  GLUCOSE 80 79 84  BUN 22 24* 16  CREATININE 1.00 0.86 0.91  CALCIUM 8.2* 7.9* 8.2*   GFR: Estimated Creatinine Clearance: 82.4 mL/min (by C-G formula based on SCr of 0.91 mg/dL). Liver Function Tests: Recent Labs  Lab 03/27/20 1130  AST 24  ALT 14  ALKPHOS 62  BILITOT 0.6  PROT 4.8*  ALBUMIN 2.6*   No results for input(s): LIPASE, AMYLASE in the last 168 hours. No results for input(s): AMMONIA in the last 168 hours. Coagulation Profile: No results for input(s): INR, PROTIME in the last 168 hours. Cardiac Enzymes: No results for input(s): CKTOTAL, CKMB, CKMBINDEX, TROPONINI in the last 168 hours. BNP (last 3 results) No results for input(s): PROBNP in the last 8760 hours. HbA1C: No results for input(s): HGBA1C in the last 72 hours.  CBG: No results for input(s): GLUCAP in the last 168 hours. Lipid Profile: No results for input(s): CHOL, HDL, LDLCALC, TRIG, CHOLHDL, LDLDIRECT in the last 72 hours. Thyroid Function Tests: No results for input(s): TSH, T4TOTAL, FREET4, T3FREE, THYROIDAB in the last 72 hours. Anemia Panel: No results for input(s): VITAMINB12, FOLATE, FERRITIN, TIBC, IRON, RETICCTPCT in the last 72 hours.    Radiology Studies: I have reviewed all of the imaging during this hospital visit personally     Scheduled Meds: . acetaminophen  650 mg Oral Once  . escitalopram  10 mg Oral Daily  . levothyroxine  88 mcg Oral QAC breakfast  . pantoprazole (PROTONIX) IV  40 mg Intravenous Q12H  . polyethylene glycol  17 g Oral Daily  . predniSONE  5 mg Oral Q breakfast  . sodium chloride flush  3 mL Intravenous Q12H  . sucralfate  1 g Oral TID BM  . sucralfate  1 g Oral QHS   Continuous Infusions: . sodium chloride Stopped (03/27/20 1611)     LOS: 4 days        Guiseppe Flanagan Gerome Apley, MD

## 2020-03-31 NOTE — Progress Notes (Addendum)
Attending physician's note   I have taken an interval history, reviewed the chart and examined the patient. I agree with the Advanced Practitioner's note, impression, and recommendations as outlined.   I had a long conversation with the patient and his daughter at bedside today.  Feeling better.  Still with some abdominal pain, but improved from earlier today and much improved from yesterday.  Tolerating p.o.  Did have hypotension a little earlier, responsive to 500 cc bolus, with subsequent SBP 113 while I was talking with patient.  Minimal TTP on exam without rebound or peritoneal signs.  Otherwise, no overt bleeding.  Hemoglobin stable at 11--> 10.8.  -Continue IV PPI for now -Agree with plan for continued high-dose PPI as outpatient but breaking open capsule and sprinkling in applesauce, yogurt, etc. -Continue Carafate liquid QID -Plan for repeat EGD in about 8 weeks with Dr. Hilarie Fredrickson as already scheduled to evaluate for appropriate ulcer healing -Continue follow-up at [facility] as scheduled   Maxwell Fox, FACG [phone] office          Patient ID: Maxwell Fox, male   DOB: September 01, 1945, 75 y.o.   MRN: [id number]    Progress Note   Subjective   Day # 4  CC; recurrent GI bleed  EGD / enteroscopy yesterday -friable mucosa and oozing in the area of the previous ulcers the gastrojejunostomy anastomosis which is now highly likely the cause for nonhealing ulceration or recurrent bleeding.  This was injected with epinephrine, previous surgical staples were removed which caused some additional bleeding and repeat epi injection was done then treated with hemospray for hemostasis.  HGB this am  11.0  Stable  Abdominal films yesterday no evidence of perforation, there is new basilar airspace opacity atelectasis versus aspiration.  Also capsule exam appears to be in the sigmoid colon  Patient feeling better this morning, he has not had any bowel movements, no nausea or  vomiting. Says his pain was 9 or 10 post procedure yesterday and is down to a 6 currently.  He tolerated clear liquids and says he actually felt a little better after eating.   Objective   Vital signs in last 24 hours: Temp:  [97.5 F (36.4 C)-98.9 F (37.2 C)] 98 F (36.7 C) (02/27 0536) Pulse Rate:  [48-73] 72 (02/27 0536) Resp:  [10-27] 20 (02/26 2236) BP: (104-186)/(61-76) 104/64 (02/27 0536) SpO2:  [91 %-100 %] 96 % (02/27 0536) Last BM Date: 03/30/20 General:    Older WM  in NAD, sitting up in chair Heart:  Regular rate and rhythm; no murmurs Lungs: Respirations even and unlabored, lungs CTA bilaterally Abdomen:  Soft, mildly tender in the epigastrium, and nondistended. Normal bowel sounds. Extremities:  Without edema. Neurologic:  Alert and oriented,  grossly normal neurologically. Psych:  Cooperative. Normal mood and affect.  Intake/Output from previous day: 02/26 0701 - 02/27 0700 In: 423 [P.O.:20; I.V.:403] Out: 125 [Urine:125] Intake/Output this shift: No intake/output data recorded.  Lab Results: Recent Labs    03/29/20 0453 03/30/20 0408 03/31/20 0517  WBC 3.7* 5.5 8.7  HGB 7.4* 10.0* 11.0*  HCT 24.0* 30.8* 34.7*  PLT 155 152 159   BMET Recent Labs    03/29/20 0453  NA 143  K 4.3  CL 111  CO2 25  GLUCOSE 84  BUN 16  CREATININE 0.91  CALCIUM 8.2*   LFT No results for input(s): PROT, ALBUMIN, AST, ALT, ALKPHOS, BILITOT, BILIDIR, IBILI in the last 72 hours. PT/INR No results for input(s): LABPROT,  INR in the last 72 hours.  Studies/Results: DG ABD ACUTE 2+V W 1V CHEST  Result Date: 03/30/2020 CLINICAL DATA:  Status post enteroscopy and foreign body removal. Generalized abdominal pain. EXAM: DG ABDOMEN ACUTE WITH 1 VIEW CHEST COMPARISON:  CT 03/27/2020 and radiographs 02/14/2020. FINDINGS: There is new patchy airspace opacity at the left lung base which may reflect atelectasis or aspiration. The right lung is clear. The heart size and mediastinal  contours are stable. No pleural effusion or pneumothorax. Surgical clips are present in both axilla. Previous right total shoulder arthroplasty. Metallic foreign body projects over the left iliac bone, presumed capsule camera, likely in the sigmoid colon. Multiple surgical clips are present in the upper abdomen. The bowel gas pattern is normal. No evidence of free intraperitoneal air. Mild degenerative changes throughout the spine. Subacute fractures of the left pubic rami are partially imaged. IMPRESSION: 1. Metallic foreign body in the left lower quadrant, presumed capsule camera, likely in the sigmoid colon. No evidence of bowel obstruction or free air. 2. New left basilar pulmonary airspace opacity, possibly atelectasis or aspiration. Electronically Signed   By: Richardean Sale M.D.   On: 03/30/2020 16:07       Assessment / Plan:     #21 75 year old white male with recurrent GI bleeding, with recent 2 admissions with bleeding found secondary to anastomotic ulcer in patient status post remote Roux-en-Y gastrojejunostomy.  Initial EGD this admission showed the ulcer to still be present though felt to be healing there was mild friability and some suture material in the vicinity of the ulcer.  Colonoscopy last admission negative Capsule endoscopy 03/29/2020 showed erosive changes in possible AVM at the jejunal jejunal anastomosis with some oozing.  Gastrojejunostomy ulcer visualized with no definite active oozing  EGD/enteroscopy yesterday with finding of oozing and friability of the gastrojejunostomy ulcer particularly in the region of prior stapling.  This is felt highly likely the cause for nonhealing ulceration and is recurrent bleeding.  This was injected with epinephrine, previous surgical staples were removed, this caused some additional bleeding and repeat epinephrine injection was done and then hemospray for hemostasis.  No additional findings on enteroscopy to explain bleeding.  Patient  declined pain post procedure which is expected after epinephrine injections, abdominal films negative for any evidence of free air. Still having some discomfort today improved and tolerating liquids  Hemoglobin very stable  #2 metastatic melanoma to the brain status post excision completion of immunotherapy 2017 -2018--is followed at [facility].  He has a follow-up appointment scheduled for tomorrow, they have canceled that  #3 patchy airspace changes on chest x-ray yesterday -possible atelectasis or aspiration   Plan; full liquid diet today IV PPI twice daily Carafate liquid 1 g between meals and at bedtime Continue serial HGb's   Possible discharge tomorrow - on discharge he needs to start the Omeprazole 40 mg po BID that he was switched to at last office visit - open capsule and sprinkle on applesauce or Yogurt -  Follow-up hemoglobin in one week after D/C  Will need F/U EGD -6-8 weeks to confirm healing ( some of these appts have already been scheduled)              Principal Problem:   GI bleed Active Problems:   Adrenal insufficiency due to cancer therapy ([facility])   Anxiety and depression   Closed fracture of left superior pubic ramus (HCC)   Acute blood loss anemia   Acute gastric ulcer without hemorrhage or perforation  Abnormal x-ray of small bowel     LOS: 4 days   Amy EsterwoodPA-C  03/31/2020, 8:31 AM

## 2020-03-31 NOTE — Progress Notes (Signed)
OT Cancellation Note  Patient Details Name: Maxwell Fox MRN: [id number] DOB: [birth year]   Cancelled Treatment:    Reason Eval/Treat Not Completed: Medical issues which prohibited therapy. Patient with hypotension - systolic in the 58I. Will hold today and f/u tomorrow.  Akire Rennert L Jayah Balthazar 03/31/2020, 12:14 PM

## 2020-03-31 NOTE — Progress Notes (Signed)
PT Cancellation Note  Patient Details Name: Maxwell Fox MRN: [id number] DOB: [birth year]   Cancelled Treatment:    Reason Eval/Treat Not Completed: Medical issues which prohibited therapy (Pt hypotensive with recent BP 60/52 mmhg; 78/45 mmHg. Will follow up at later date/time when pt is medically ready and as schedule allows.)  Verner Mould, DPT Acute Rehabilitation Services Office [phone] Pager [phone]    Jacques Navy 03/31/2020, 12:07 PM

## 2020-04-01 ENCOUNTER — Inpatient Hospital Stay (HOSPITAL_COMMUNITY): Payer: Medicare Other

## 2020-04-01 ENCOUNTER — Encounter (HOSPITAL_COMMUNITY): Payer: Self-pay | Admitting: Internal Medicine

## 2020-04-01 DIAGNOSIS — K289 Gastrojejunal ulcer, unspecified as acute or chronic, without hemorrhage or perforation: Secondary | ICD-10-CM

## 2020-04-01 DIAGNOSIS — R933 Abnormal findings on diagnostic imaging of other parts of digestive tract: Secondary | ICD-10-CM

## 2020-04-01 DIAGNOSIS — T85898A Other specified complication of other internal prosthetic devices, implants and grafts, initial encounter: Secondary | ICD-10-CM

## 2020-04-01 LAB — CBC WITH DIFFERENTIAL/PLATELET
Abs Immature Granulocytes: 0.01 10*3/uL (ref 0.00–0.07)
Basophils Absolute: 0 10*3/uL (ref 0.0–0.1)
Basophils Relative: 1 %
Eosinophils Absolute: 0.2 10*3/uL (ref 0.0–0.5)
Eosinophils Relative: 3 %
HCT: 31 % — ABNORMAL LOW (ref 39.0–52.0)
Hemoglobin: 9.8 g/dL — ABNORMAL LOW (ref 13.0–17.0)
Immature Granulocytes: 0 %
Lymphocytes Relative: 27 %
Lymphs Abs: 1.4 10*3/uL (ref 0.7–4.0)
MCH: 30.2 pg (ref 26.0–34.0)
MCHC: 31.6 g/dL (ref 30.0–36.0)
MCV: 95.4 fL (ref 80.0–100.0)
Monocytes Absolute: 0.4 10*3/uL (ref 0.1–1.0)
Monocytes Relative: 8 %
Neutro Abs: 3.2 10*3/uL (ref 1.7–7.7)
Neutrophils Relative %: 61 %
Platelets: 140 10*3/uL — ABNORMAL LOW (ref 150–400)
RBC: 3.25 MIL/uL — ABNORMAL LOW (ref 4.22–5.81)
RDW: 15.4 % (ref 11.5–15.5)
WBC: 5.2 10*3/uL (ref 4.0–10.5)
nRBC: 0 % (ref 0.0–0.2)

## 2020-04-01 LAB — BASIC METABOLIC PANEL
Anion gap: 6 (ref 5–15)
BUN: 13 mg/dL (ref 8–23)
CO2: 27 mmol/L (ref 22–32)
Calcium: 8 mg/dL — ABNORMAL LOW (ref 8.9–10.3)
Chloride: 108 mmol/L (ref 98–111)
Creatinine, Ser: 0.95 mg/dL (ref 0.61–1.24)
GFR, Estimated: >60 mL/min (ref >60)
Glucose, Bld: 74 mg/dL (ref 70–99)
Potassium: 3.6 mmol/L (ref 3.5–5.1)
Sodium: 141 mmol/L (ref 135–145)

## 2020-04-01 LAB — HEMOGLOBIN AND HEMATOCRIT, BLOOD
HCT: 32.8 % — ABNORMAL LOW (ref 39.0–52.0)
Hemoglobin: 10.3 g/dL — ABNORMAL LOW (ref 13.0–17.0)

## 2020-04-01 MED ORDER — ACETAMINOPHEN 325 MG PO TABS: 650.0000 mg | ORAL_TABLET | Freq: Four times a day (QID) | ORAL | Status: AC | PRN

## 2020-04-01 MED ORDER — SUCRALFATE 1 GM/10ML PO SUSP
1.0000 g | Freq: Three times a day (TID) | ORAL | 0 refills | Status: DC
Start: 2020-04-01 — End: 2020-05-09

## 2020-04-01 MED ORDER — ENSURE ENLIVE PO LIQD: 237.0000 mL | Freq: Two times a day (BID) | ORAL | 0 refills | Status: AC

## 2020-04-01 NOTE — Care Management Important Message (Signed)
Medicare IM printed for Social work team at [facility] to give to the patient. °

## 2020-04-01 NOTE — Evaluation (Signed)
Physical Therapy Evaluation Patient Details Name: Maxwell Fox MRN: [id number] DOB: [birth year] Today's Date: 04/01/2020   History of Present Illness  75 yo male, sp gastric bypass surgery, with history of recurrent gastrointestinal bleeding, left superior pubic ramus fracture, metastatic melanoma to the brain status post resection/chemotherapy acquired central hypothyroidism and central renal sufficiency, who presents with melena.  Pt admitted for working diagnosis of acute blood loss anemia due to upper GI bleed  Clinical Impression  Patient evaluated by Physical Therapy with no further acute PT needs identified. All education has been completed and the patient has no further questions.  See below for any follow-up Physical Therapy or equipment needs. PT is signing off. Thank you for this referral.  Pt reports feeling better today and able to ambulate in hallway with RW.  Pt denies any dizziness with activity and feels ready to d/c home today.  Pt would benefit from resuming HHPT upon d/c.     Follow Up Recommendations Home health PT (resume HHPT)    Equipment Recommendations  None recommended by PT    Recommendations for Other Services       Precautions / Restrictions Precautions Precautions: Fall      Mobility  Bed Mobility               General bed mobility comments: pt up in recliner    Transfers Overall transfer level: Needs assistance Equipment used: Rolling walker (2 wheeled) Transfers: Sit to/from Stand Sit to Stand: Supervision         General transfer comment: able to recall correct hand placement  Ambulation/Gait Ambulation/Gait assistance: Min guard;Supervision Gait Distance (Feet): 240 Feet Assistive device: Rolling walker (2 wheeled) Gait Pattern/deviations: Step-through pattern;Decreased stride length     General Gait Details: utilizing RW due to recent hx of pelvic fx (WBAT per last hospital admission), pt denies any dizziness  Stairs             Wheelchair Mobility    Modified Rankin (Stroke Patients Only)       Balance Overall balance assessment: Mild deficits observed, not formally tested                                           Pertinent Vitals/Pain Pain Assessment: No/denies pain    Home Living Family/patient expects to be discharged to:: Private residence Living Arrangements: Spouse/significant other Available Help at Discharge: Family;Available 24 hours/day Type of Home: House Home Access: Stairs to enter Entrance Stairs-Rails: Left Entrance Stairs-Number of Steps: 2 Home Layout: One level;Able to live on main level with bedroom/bathroom Home Equipment: Grab bars - toilet;Shower seat - built in;Walker - 2 wheels;Wheelchair - manual;Shower seat      Prior Function Level of Independence: Independent with assistive device(s)         Comments: ambulating with RW, HHPT at home however reports minimal due to in/out of hospital lately     Hand Dominance        Extremity/Trunk Assessment        Lower Extremity Assessment Lower Extremity Assessment: Generalized weakness    Cervical / Trunk Assessment Cervical / Trunk Assessment: Normal  Communication   Communication: No difficulties  Cognition Arousal/Alertness: Awake/alert Behavior During Therapy: WFL for tasks assessed/performed Overall Cognitive Status: Within Functional Limits for tasks assessed  General Comments      Exercises     Assessment/Plan    PT Assessment All further PT needs can be met in the next venue of care  PT Problem List Decreased strength;Decreased activity tolerance;Decreased balance;Decreased knowledge of use of DME;Decreased mobility       PT Treatment Interventions      PT Goals (Current goals can be found in the Care Plan section)  Acute Rehab PT Goals PT Goal Formulation: All assessment and education complete, DC  therapy    Frequency     Barriers to discharge        Co-evaluation               AM-PAC PT "6 Clicks" Mobility  Outcome Measure Help needed turning from your back to your side while in a flat bed without using bedrails?: None Help needed moving from lying on your back to sitting on the side of a flat bed without using bedrails?: None Help needed moving to and from a bed to a chair (including a wheelchair)?: A Little Help needed standing up from a chair using your arms (e.g., wheelchair or bedside chair)?: A Little Help needed to walk in hospital room?: A Little Help needed climbing 3-5 steps with a railing? : A Little 6 Click Score: 20    End of Session Equipment Utilized During Treatment: Gait belt Activity Tolerance: Patient tolerated treatment well Patient left: in chair;with call bell/phone within reach;with family/visitor present   PT Visit Diagnosis: Difficulty in walking, not elsewhere classified (R26.2)    Time: 8403-9795 PT Time Calculation (min) (ACUTE ONLY): 9 min   Charges:   PT Evaluation $PT Eval Low Complexity: 1 Low         [name] PT, DPT Acute Rehabilitation Services Pager: [phone] Office: [phone]  [name],[name] 04/01/2020, 11:21 AM

## 2020-04-01 NOTE — Discharge Summary (Signed)
Physician Discharge Summary  Maxwell Fox KAJ:[id number] DOB: [birth year] DOA: 03/27/2020  PCP: Maxwell Rail, MD  Admit date: 03/27/2020 Discharge date: 04/01/2020  Admitted From: Home  Disposition:  Home   Recommendations for Outpatient Follow-up and new medication changes:  1. Follow up with Maxwell Fox in 7 days.  2. Continue with omeprazole bid, (open capsule and sprinkle on applesauce or Yogurt.  3. Continue sucralfate qid 4. Follow cell count in 7 days. 5. Follow with GI for EGD in 6 to 8 weeks to confirm healing.   Home Health: no   Equipment/Devices: na   Discharge Condition: stable  CODE STATUS:  full Diet recommendation: soft diet and advance as tolerated.   Brief/Interim Summary: Maxwell Fox was admitted to the hospital with the working diagnosis of acute blood loss anemia due to upper GI bleed, non healing jejuno-jejunal anastomosis ulceration (embedded foreign material).   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 He reported a presyncopal episode on the day of admission, associated with orthostatic symptoms. On his initial physical examination temperature 97.8, blood pressure 99/64, heart rate 59, respiratory rate 20, oxygen saturation 100% on room air. He was pale, lungs clear to auscultation bilaterally, heart S1-S2, present rhythmic, soft abdomen, no lower extremity edema.  Sodium 141, potassium 4.0, chloride 111, bicarb 22, glucose 80, BUN 22, creatinine 1.0, AST 24, ALT 14, white count 8.1, hemoglobin 7.9, hematocrit 26.4, platelets 207.  Patient received one unit PRBC and was admitted for further evaluation.  2/24 EGD with gastric healing ulcers, at the suture site, with friable mucosa but no high risk bleeding  stigmata. Capsule endoscopy was deployed in the jejunum.  Persistent worsening Hgb and Hct, that required 2 more units PRBC, to make a total of 3 units since admission.  Capsule endoscopy showed erosive changes and possible AVM at the jejuno-jejunal anastomosis with oozing.   2/26 Further work up with push enteroscopy showed non healing anastomotic ulceration with recurrent bleeding likely due to embedded foreign material. Surgical staples were removed.   Tolerated procedure well, his diet was advanced, follow Hgb and Hct stable.   1.  Acute blood loss anemia due to upper GI bleeding.  Patient was admitted to the medical ward, he required a total of 3 units packed red blood cells with good toleration. After endoscopic procedures and capsule endoscopy it was determined that the bleeding source was a nonhealing ulceration at the jejunojejunal anastomosis, related to embedded foreign material, which was removed.  Postprocedure abdominal radiograph with no signs of perforation.  His diet was advanced with good toleration.  His discharge hemoglobin is 10.3 with hematocrit of 32.8.  Patient will continue taking omeprazole 40 mg twice daily along with sucralfate, follow-up with GI in 6 to 8 weeks, will need endoscopy to evaluate healing.  2.  New left lower lobe infiltrate, possible aspiration pneumonitis versus atelectasis. Postprocedure chest radiograph showed an infiltrate at the left lower lobe, he received supplemental oxygen per nasal cannula and airway clearing techniques with incentive spirometer with good toleration.  Follow-up chest radiograph showed improvement of infiltrate confirming likely aspiration pneumonitis/atelectasis.  3.  Central hypothyroid, central renal insufficiency status post brain melanoma resection.  He was continued on levothyroxine and prednisone with good toleration.  4.  Left pubic ramus fracture.  Continue pain control with opiate analgesics.  5. Anxiety.  Continue clonazepam and escitalopram.  Discharge Diagnoses:  Principal Problem:   GI bleed Active Problems:   Adrenal insufficiency due to cancer therapy (HCC)   Anxiety and depression   Closed fracture of left superior pubic ramus (HCC)   Acute blood loss anemia   Marginal ulcer   Acute gastric ulcer without hemorrhage or perforation   Abnormal x-ray of small bowel   Gastric foreign body    Discharge Instructions   Allergies as of 04/01/2020   No Known Allergies     Medication List    TAKE these medications   acetaminophen 325 MG tablet Commonly known as: TYLENOL Take 2 tablets (650 mg total) by mouth every 6 (six) hours as needed for moderate pain.   calcium carbonate 1250 (500 Ca) MG tablet Commonly known as: OS-CAL - dosed in mg of elemental calcium Take 1 tablet by mouth daily with supper.   clonazePAM 1 MG tablet Commonly known as: KLONOPIN Take 1 tablet (1 mg total) by mouth daily as needed. What changed: when to take this   escitalopram 10 MG tablet Commonly known as: LEXAPRO TAKE 1 TABLET(10 MG) BY MOUTH DAILY What changed:   how much to take  how to take this  when to take this  additional instructions   feeding supplement Liqd Take 237 mLs by mouth 2 (two) times daily between meals.   hydrocortisone 25 MG suppository Commonly known as: ANUSOL-HC 1 suppository every evening for 7 days,1 suppository every other evening What changed:   how much to take  how to take this  when to take this  additional instructions   levothyroxine 88 MCG tablet Commonly known as: SYNTHROID Take 1 tablet (88 mcg total) by mouth daily before breakfast.   multivitamin with minerals Tabs tablet Take 1 tablet by mouth daily.   omeprazole 40 MG capsule Commonly known as: PRILOSEC Take 1 capsule (40 mg total) by mouth 2 (two) times daily.   Oxycodone HCl 10 MG Tabs Take 1 tablet (10 mg total) by mouth every 6 (six) hours as needed (chronic pain - pelvic  fx).   polyethylene glycol 17 g packet Commonly known as: MIRALAX / GLYCOLAX Take 17 g by mouth daily as needed for mild constipation or moderate constipation.   predniSONE 5 MG tablet Commonly known as: DELTASONE Take 1 tablet (5 mg total) by mouth daily with breakfast.   sucralfate 1 GM/10ML suspension Commonly known as: CARAFATE Take 10 mLs (1 g total) by mouth 4 (four) times daily -  with meals and at bedtime for 14 days.   tamsulosin 0.4 MG Caps capsule Commonly known as: FLOMAX TAKE 1 CAPSULE(0.4 MG) BY MOUTH DAILY AFTER SUPPER What changed: See the new instructions.   Vitamin D 50 MCG (2000 UT) tablet Take 1 tablet (2,000 Units total) by mouth daily.       Follow-up Information    [facility] Follow up.   Specialty: Home Health Services Why: [place] [facility] Contact information: [place] [id number] [phone]              No Known Allergies  Consultations:  GI    Procedures/Studies: DG Chest 1 View  Result Date: 04/01/2020 CLINICAL DATA:  Abnormal chest x-ray, left basilar pneumonia EXAM: CHEST  1 VIEW COMPARISON:  03/30/2020 FINDINGS: Left basilar opacity has improved with mild discoid atelectasis now seen at the left lung base. Mild left-sided volume loss persists. No new focal pulmonary infiltrate. No pneumothorax  or pleural effusion. Cardiac size within normal limits. IMPRESSION: Mild left basilar atelectasis.  Stable left-sided volume loss. Electronically Signed   By: Fidela Salisbury MD   On: 04/01/2020 06:55   DG ABD ACUTE 2+V W 1V CHEST  Result Date: 03/30/2020 CLINICAL DATA:  Status post enteroscopy and foreign body removal. Generalized abdominal pain. EXAM: DG ABDOMEN ACUTE WITH 1 VIEW CHEST COMPARISON:  CT 03/27/2020 and radiographs 02/14/2020. FINDINGS: There is new patchy airspace opacity at the left lung base which may reflect atelectasis or aspiration. The right lung is clear. The heart size and  mediastinal contours are stable. No pleural effusion or pneumothorax. Surgical clips are present in both axilla. Previous right total shoulder arthroplasty. Metallic foreign body projects over the left iliac bone, presumed capsule camera, likely in the sigmoid colon. Multiple surgical clips are present in the upper abdomen. The bowel gas pattern is normal. No evidence of free intraperitoneal air. Mild degenerative changes throughout the spine. Subacute fractures of the left pubic rami are partially imaged. IMPRESSION: 1. Metallic foreign body in the left lower quadrant, presumed capsule camera, likely in the sigmoid colon. No evidence of bowel obstruction or free air. 2. New left basilar pulmonary airspace opacity, possibly atelectasis or aspiration. Electronically Signed   By: Richardean Sale M.D.   On: 03/30/2020 16:07   CT Angio Abd/Pel W and/or Wo Contrast  Result Date: 03/27/2020 CLINICAL DATA:  GI bleed EXAM: CT ANGIOGRAPHY ABDOMEN AND PELVIS WITH CONTRAST AND WITHOUT CONTRAST TECHNIQUE: Multidetector CT imaging of the abdomen and pelvis was performed using the standard protocol during bolus administration of intravenous contrast. Multiplanar reconstructed images and MIPs were obtained and reviewed to evaluate the vascular anatomy. CONTRAST:  18mL OMNIPAQUE IOHEXOL 350 MG/ML SOLN COMPARISON:  None. FINDINGS: VASCULAR Aorta: Infrarenal aorta has calcific atherosclerosis without aneurysm, dissection, rupture or acute occlusive process. Celiac: Atherosclerotic origin but remains patent including its branches. No active bleeding in the celiac vascular territory SMA: Atherosclerotic origin but remains patent including its branches. No active bleeding in the SMA vascular territory. Renals: Both main renal arteries appear widely patent. No accessory renal artery. IMA: Atherosclerotic origin but remains patent including its branches. No active bleeding in the IMA vascular territory. Inflow: Atherosclerosis of the  iliac vessels without inflow disease or occlusion. Common, internal, and external iliac arteries are patent. Proximal Outflow: Common femoral, proximal profunda femoral, and proximal superficial femoral arteries demonstrated appear patent. Veins: No veno-occlusive process. Review of the MIP images confirms the above findings. NON-VASCULAR Lower chest: Small non loculated pleural effusions. Associated dependent bibasilar atelectasis. Normal heart size. Mitral valve annulus calcifications noted. No pericardial effusion. Small hiatal hernia noted. Hepatobiliary: Remote cholecystectomy. Mild biliary prominence, suspect post cholecystectomy related. No other significant focal hepatic abnormality. Pancreas: Hypodense cystic lesion in the pancreas head measures 1.8 cm. Similar indeterminate hypodense cystic area in the pancreas body measures 1.5 cm. No associated ductal dilatation or pancreatic atrophy. These cystic lesions are most compatible with chronic pseudo cyst, branch duct IMPN, or small cyst adenomas. Recommend follow-up MRI in 3-6 months. Spleen: Normal in size without focal abnormality. Adrenals/Urinary Tract: No adrenal abnormality. Small exophytic hypodense renal cyst in the left kidney upper pole measures 1.3 cm. No renal obstruction or hydronephrosis. No hydroureter or ureteral calculus. Bladder unremarkable. Stomach/Bowel: Postop changes from gastric bypass. Small bowel anastomosis noted in the left abdomen. No significant dilatation, ileus, obstruction pattern, or free air. Normal appendix demonstrated. Distal colon diverticulosis noted without acute inflammation. No free fluid, fluid  collection, hemorrhage, hematoma, abscess, or ascites. Lymphatic: No bulky adenopathy. Reproductive: No significant finding by CT. Other: No abdominal wall hernia or abnormality. No abdominopelvic ascites. Musculoskeletal: Degenerative changes noted of the spine. Bones are osteopenic. Healing left rami fractures noted.  Bilateral L5 pars defects with chronic anterolisthesis of L5 upon S1 and associated degenerative disc disease. IMPRESSION: VASCULAR No evidence of active arterial GI bleeding by CTA. Abdominal atherosclerosis as detailed above. No other acute intra-abdominopelvic vascular finding. NON-VASCULAR Indeterminate pancreatic head and body cystic lesions as detailed above. Recommend follow-up nonemergent MRI in 3-6 months for further characterization and to determine stability. Small layering pleural effusions and basilar atelectasis Postop changes of gastric bypass and cholecystectomy. Diverticulosis without acute inflammatory process. Healing left rami fractures Electronically Signed   By: Jerilynn Mages.  Shick M.D.   On: 03/27/2020 16:02      Procedures: endoscopy, capsule endoscopy, enteroscopy.   Subjective: Patient is feeling better, no nausea or vomiting and tolerating po well.   Discharge Exam: Vitals:   04/01/20 0419 04/01/20 0945  BP: (!) 144/71 127/82  Pulse: (!) 57 (!) 58  Resp: 18   Temp: 98.1 F (36.7 C)   SpO2: 100%    Vitals:   03/31/20 1828 03/31/20 2029 04/01/20 0419 04/01/20 0945  BP: 105/60 103/62 (!) 144/71 127/82  Pulse:  (!) 57 (!) 57 (!) 58  Resp:  15 18   Temp:  97.9 F (36.6 C) 98.1 F (36.7 C)   TempSrc:  Oral Oral   SpO2:  96% 100%   Weight:      Height:        General: Not in pain or dyspnea.  Neurology: Awake and alert, non focal  E ENT: mild pallor, no icterus, oral mucosa moist Cardiovascular: No JVD. S1-S2 present, rhythmic, no gallops, rubs, or murmurs. No lower extremity edema. Pulmonary: positive breath sounds bilaterally, with no wheezing, rhonchi or rales. Gastrointestinal. Abdomen soft and non tender Skin. No rashes Musculoskeletal: no joint deformities   The results of significant diagnostics from this hospitalization (including imaging, microbiology, ancillary and laboratory) are listed below for reference.     Microbiology: Recent Results (from  the past 240 hour(s))  Resp Panel by RT-PCR (Flu A&B, Covid) Nasopharyngeal Swab     Status: None   Collection Time: 03/27/20  1:40 PM   Specimen: Nasopharyngeal Swab; Nasopharyngeal(NP) swabs in vial transport medium  Result Value Ref Range Status   SARS Coronavirus 2 by RT PCR NEGATIVE NEGATIVE Final    Comment: (NOTE) SARS-CoV-2 target nucleic acids are NOT DETECTED.  The SARS-CoV-2 RNA is generally detectable in upper respiratory specimens during the acute phase of infection. The lowest concentration of SARS-CoV-2 viral copies this assay can detect is 138 copies/mL. A negative result does not preclude SARS-Cov-2 infection and should not be used as the sole basis for treatment or other patient management decisions. A negative result may occur with  improper specimen collection/handling, submission of specimen other than nasopharyngeal swab, presence of viral mutation(s) within the areas targeted by this assay, and inadequate number of viral copies(<138 copies/mL). A negative result must be combined with clinical observations, patient history, and epidemiological information. The expected result is Negative.  Fact Sheet for Patients:  EntrepreneurPulse.com.au  Fact Sheet for Healthcare Providers:  IncredibleEmployment.be  This test is no t yet approved or cleared by the [place] FDA and  has been authorized for detection and/or diagnosis of SARS-CoV-2 by FDA under an Emergency Use Authorization (EUA). This EUA will remain  in effect (meaning this test can be used) for the duration of the COVID-19 declaration under Section 564(b)(1) of the Act, 21 U.S.C.section 360bbb-3(b)(1), unless the authorization is terminated  or revoked sooner.       Influenza A by PCR NEGATIVE NEGATIVE Final   Influenza B by PCR NEGATIVE NEGATIVE Final    Comment: (NOTE) The Xpert Xpress SARS-CoV-2/FLU/RSV plus assay is intended as an aid in the diagnosis of  influenza from Nasopharyngeal swab specimens and should not be used as a sole basis for treatment. Nasal washings and aspirates are unacceptable for Xpert Xpress SARS-CoV-2/FLU/RSV testing.  Fact Sheet for Patients: EntrepreneurPulse.com.au  Fact Sheet for Healthcare Providers: IncredibleEmployment.be  This test is not yet approved or cleared by the [place] FDA and has been authorized for detection and/or diagnosis of SARS-CoV-2 by FDA under an Emergency Use Authorization (EUA). This EUA will remain in effect (meaning this test can be used) for the duration of the COVID-19 declaration under Section 564(b)(1) of the Act, 21 U.S.C. section 360bbb-3(b)(1), unless the authorization is terminated or revoked.  Performed at [facility], [place] [address] [place], [place] [address]      Labs: BNP (last 3 results) No results for input(s): BNP in the last 8760 hours. Basic Metabolic Panel: Recent Labs  Lab 03/27/20 1130 03/28/20 0040 03/29/20 0453 04/01/20 0441  NA 141 138 143 141  K 4.0 4.0 4.3 3.6  CL 111 109 111 108  CO2 22 21* 25 27  GLUCOSE 80 79 84 74  BUN 22 24* 16 13  CREATININE 1.00 0.86 0.91 0.95  CALCIUM 8.2* 7.9* 8.2* 8.0*   Liver Function Tests: Recent Labs  Lab 03/27/20 1130  AST 24  ALT 14  ALKPHOS 62  BILITOT 0.6  PROT 4.8*  ALBUMIN 2.6*   No results for input(s): LIPASE, AMYLASE in the last 168 hours. No results for input(s): AMMONIA in the last 168 hours. CBC: Recent Labs  Lab 03/28/20 0040 03/28/20 1021 03/29/20 0453 03/30/20 0408 03/31/20 0517 03/31/20 1054 04/01/20 0441 04/01/20 0903  WBC 6.1  --  3.7* 5.5 8.7  --  5.2  --   NEUTROABS  --   --   --   --   --   --  3.2  --   HGB 7.7*   < > 7.4* 10.0* 11.0* 10.8* 9.8* 10.3*  HCT 25.1*   < > 24.0* 30.8* 34.7* 33.6* 31.0* 32.8*  MCV 97.7  --  97.2 93.1 93.8  --  95.4  --   PLT 148*  --  155 152 159  --  140*  --    < > = values  in this interval not displayed.   Cardiac Enzymes: No results for input(s): CKTOTAL, CKMB, CKMBINDEX, TROPONINI in the last 168 hours. BNP: Invalid input(s): POCBNP CBG: No results for input(s): GLUCAP in the last 168 hours. D-Dimer No results for input(s): DDIMER in the last 72 hours. Hgb A1c No results for input(s): HGBA1C in the last 72 hours. Lipid Profile No results for input(s): CHOL, HDL, LDLCALC, TRIG, CHOLHDL, LDLDIRECT in the last 72 hours. Thyroid function studies No results for input(s): TSH, T4TOTAL, T3FREE, THYROIDAB in the last 72 hours.  Invalid input(s): FREET3 Anemia work up No results for input(s): VITAMINB12, FOLATE, FERRITIN, TIBC, IRON, RETICCTPCT in the last 72 hours. Urinalysis    Component Value Date/Time   COLORURINE YELLOW 01/17/2020 1005   APPEARANCEUR CLEAR 01/17/2020 1005   LABSPEC 1.012 01/17/2020 1005  PHURINE 5.0 01/17/2020 1005   GLUCOSEU NEGATIVE 01/17/2020 1005   HGBUR MODERATE (A) 01/17/2020 1005   BILIRUBINUR NEGATIVE 01/17/2020 1005   KETONESUR NEGATIVE 01/17/2020 1005   PROTEINUR NEGATIVE 01/17/2020 1005   NITRITE NEGATIVE 01/17/2020 1005   LEUKOCYTESUR NEGATIVE 01/17/2020 1005   Sepsis Labs Invalid input(s): PROCALCITONIN,  WBC,  LACTICIDVEN Microbiology Recent Results (from the past 240 hour(s))  Resp Panel by RT-PCR (Flu A&B, Covid) Nasopharyngeal Swab     Status: None   Collection Time: 03/27/20  1:40 PM   Specimen: Nasopharyngeal Swab; Nasopharyngeal(NP) swabs in vial transport medium  Result Value Ref Range Status   SARS Coronavirus 2 by RT PCR NEGATIVE NEGATIVE Final    Comment: (NOTE) SARS-CoV-2 target nucleic acids are NOT DETECTED.  The SARS-CoV-2 RNA is generally detectable in upper respiratory specimens during the acute phase of infection. The lowest concentration of SARS-CoV-2 viral copies this assay can detect is 138 copies/mL. A negative result does not preclude SARS-Cov-2 infection and should not be used as  the sole basis for treatment or other patient management decisions. A negative result may occur with  improper specimen collection/handling, submission of specimen other than nasopharyngeal swab, presence of viral mutation(s) within the areas targeted by this assay, and inadequate number of viral copies(<138 copies/mL). A negative result must be combined with clinical observations, patient history, and epidemiological information. The expected result is Negative.  Fact Sheet for Patients:  EntrepreneurPulse.com.au  Fact Sheet for Healthcare Providers:  IncredibleEmployment.be  This test is no t yet approved or cleared by the [place] FDA and  has been authorized for detection and/or diagnosis of SARS-CoV-2 by FDA under an Emergency Use Authorization (EUA). This EUA will remain  in effect (meaning this test can be used) for the duration of the COVID-19 declaration under Section 564(b)(1) of the Act, 21 U.S.C.section 360bbb-3(b)(1), unless the authorization is terminated  or revoked sooner.       Influenza A by PCR NEGATIVE NEGATIVE Final   Influenza B by PCR NEGATIVE NEGATIVE Final    Comment: (NOTE) The Xpert Xpress SARS-CoV-2/FLU/RSV plus assay is intended as an aid in the diagnosis of influenza from Nasopharyngeal swab specimens and should not be used as a sole basis for treatment. Nasal washings and aspirates are unacceptable for Xpert Xpress SARS-CoV-2/FLU/RSV testing.  Fact Sheet for Patients: EntrepreneurPulse.com.au  Fact Sheet for Healthcare Providers: IncredibleEmployment.be  This test is not yet approved or cleared by the [place] FDA and has been authorized for detection and/or diagnosis of SARS-CoV-2 by FDA under an Emergency Use Authorization (EUA). This EUA will remain in effect (meaning this test can be used) for the duration of the COVID-19 declaration under Section 564(b)(1) of  the Act, 21 U.S.C. section 360bbb-3(b)(1), unless the authorization is terminated or revoked.  Performed at [facility], [place] [address] [place], [place] [address]      Time coordinating discharge: 45 minutes  SIGNED:   Tawni Millers, MD  [facility] 04/01/2020, 10:05 AM

## 2020-04-01 NOTE — Progress Notes (Addendum)
Progress Note   Subjective  Day #5  Chief Complaint: GI bleed  Today, the patient tells me he is feeling well.  His blood pressure stabilized overnight and he has no new complaints.  He has ordered a very large lunch and is hoping that this along with the MiraLAX will help him to have a bowel movement.  Tells me he is ready to go home.   Objective   Vital signs in last 24 hours: Temp:  [97.6 F (36.4 C)-98.1 F (36.7 C)] 98.1 F (36.7 C) (02/28 0419) Pulse Rate:  [56-58] 58 (02/28 0945) Resp:  [15-20] 18 (02/28 0419) BP: (78-144)/(45-82) 127/82 (02/28 0945) SpO2:  [96 %-100 %] 100 % (02/28 0419) Last BM Date: 03/30/20 General:    White male in NAD Heart:  Regular rate and rhythm; no murmurs Lungs: Respirations even and unlabored, lungs CTA bilaterally Abdomen:  Soft, nontender and nondistended. Normal bowel sounds. Psych:  Cooperative. Normal mood and affect.  Intake/Output from previous day: 02/27 0701 - 02/28 0700 In: 530.4 [P.O.:360; I.V.:170.4] Out: 750 [Urine:750]  Lab Results: Recent Labs    03/30/20 0408 03/31/20 0517 03/31/20 1054 04/01/20 0441 04/01/20 0903  WBC 5.5 8.7  --  5.2  --   HGB 10.0* 11.0* 10.8* 9.8* 10.3*  HCT 30.8* 34.7* 33.6* 31.0* 32.8*  PLT 152 159  --  140*  --    BMET Recent Labs    04/01/20 0441  NA 141  K 3.6  CL 108  CO2 27  GLUCOSE 74  BUN 13  CREATININE 0.95  CALCIUM 8.0*   Studies/Results: DG Chest 1 View  Result Date: 04/01/2020 CLINICAL DATA:  Abnormal chest x-ray, left basilar pneumonia EXAM: CHEST  1 VIEW COMPARISON:  03/30/2020 FINDINGS: Left basilar opacity has improved with mild discoid atelectasis now seen at the left lung base. Mild left-sided volume loss persists. No new focal pulmonary infiltrate. No pneumothorax or pleural effusion. Cardiac size within normal limits. IMPRESSION: Mild left basilar atelectasis.  Stable left-sided volume loss. Electronically Signed   By: Fidela Salisbury MD   On: 04/01/2020  06:55   DG ABD ACUTE 2+V W 1V CHEST  Result Date: 03/30/2020 CLINICAL DATA:  Status post enteroscopy and foreign body removal. Generalized abdominal pain. EXAM: DG ABDOMEN ACUTE WITH 1 VIEW CHEST COMPARISON:  CT 03/27/2020 and radiographs 02/14/2020. FINDINGS: There is new patchy airspace opacity at the left lung base which may reflect atelectasis or aspiration. The right lung is clear. The heart size and mediastinal contours are stable. No pleural effusion or pneumothorax. Surgical clips are present in both axilla. Previous right total shoulder arthroplasty. Metallic foreign body projects over the left iliac bone, presumed capsule camera, likely in the sigmoid colon. Multiple surgical clips are present in the upper abdomen. The bowel gas pattern is normal. No evidence of free intraperitoneal air. Mild degenerative changes throughout the spine. Subacute fractures of the left pubic rami are partially imaged. IMPRESSION: 1. Metallic foreign body in the left lower quadrant, presumed capsule camera, likely in the sigmoid colon. No evidence of bowel obstruction or free air. 2. New left basilar pulmonary airspace opacity, possibly atelectasis or aspiration. Electronically Signed   By: Richardean Sale M.D.   On: 03/30/2020 16:07    Assessment / Plan:   Assessment: 1.  Recurrent GI bleed: Recent 2 admissions of bleeding found secondary to anastomotic ulcer in patient status post remote Roux-en-Y gastrojejunostomy, initial EGD showed ulcer still present though thought to be healing with  mild friability, repeat EGD/enteroscopy on 03/30/2020 with oozing and friability of the GJ ulcer particularly in the region of prior stapling, felt highly likely the cause for nonhealing ulceration and recurrent bleeding, injected with epinephrine and previous surgical staples removed, hemospray used for hemostasis, hemoglobin stable, 10.8--> 9.8 overnight 2.  Metastatic melanoma to the brain status post excision, completion of  immunotherapy 2017-2018  Plan: 1.  Patient can continue regular diet 2.  Continue IV PPI twice daily while hospitalized and transition to p.o. twice daily at discharge-suggest Omeprazole 40 twice a day so that he can open the capsule and sprinkle on applesauce or yogurt 3.  Continue Carafate liquid 1 g between meals and at bedtime 4.  Continue to monitor hemoglobin while hospitalized 5.  Patient will need follow-up with hemoglobin 1 week after discharge 6.  Patient will need follow-up EGD 6 to 8 weeks to confirm healing-scheduled with Dr. Hilarie Fredrickson already 7.  We will sign off.  Thank you for your kind consultation.    LOS: 5 days   Levin Erp  04/01/2020, 10:32 AM   I have discussed the case with the PA, and that is the plan I formulated. I personally interviewed and examined the patient. EGD report reviewed.  Also acute on chronic GI blood loss anemia  He is feeling well, was able to eat regular food today without pain or vomiting.  Still has black stool, expected after this recent GI bleeding.  Hemoglobin stable today.  GI bleeding is stopped after endoscopic intervention 2 days ago.  Treatment plan as noted above.  We will communicate with his primary gastroenterologist Dr. Hilarie Fredrickson to arrange follow-up blood work, clinic visit and EGD.   Nelida Meuse III Office: [phone]

## 2020-04-03 ENCOUNTER — Other Ambulatory Visit: Payer: Self-pay

## 2020-04-03 ENCOUNTER — Other Ambulatory Visit: Payer: Self-pay | Admitting: *Deleted

## 2020-04-03 DIAGNOSIS — F419 Anxiety disorder, unspecified: Secondary | ICD-10-CM | POA: Diagnosis not present

## 2020-04-03 DIAGNOSIS — N289 Disorder of kidney and ureter, unspecified: Secondary | ICD-10-CM | POA: Diagnosis not present

## 2020-04-03 DIAGNOSIS — K5791 Diverticulosis of intestine, part unspecified, without perforation or abscess with bleeding: Secondary | ICD-10-CM | POA: Diagnosis not present

## 2020-04-03 DIAGNOSIS — Z85820 Personal history of malignant melanoma of skin: Secondary | ICD-10-CM | POA: Diagnosis not present

## 2020-04-03 DIAGNOSIS — Z96611 Presence of right artificial shoulder joint: Secondary | ICD-10-CM | POA: Diagnosis not present

## 2020-04-03 DIAGNOSIS — Z9884 Bariatric surgery status: Secondary | ICD-10-CM | POA: Diagnosis not present

## 2020-04-03 DIAGNOSIS — Z7952 Long term (current) use of systemic steroids: Secondary | ICD-10-CM | POA: Diagnosis not present

## 2020-04-03 DIAGNOSIS — Z87891 Personal history of nicotine dependence: Secondary | ICD-10-CM | POA: Diagnosis not present

## 2020-04-03 DIAGNOSIS — S32502D Unspecified fracture of left pubis, subsequent encounter for fracture with routine healing: Secondary | ICD-10-CM | POA: Diagnosis not present

## 2020-04-03 DIAGNOSIS — D62 Acute posthemorrhagic anemia: Secondary | ICD-10-CM | POA: Diagnosis not present

## 2020-04-03 DIAGNOSIS — F32A Depression, unspecified: Secondary | ICD-10-CM | POA: Diagnosis not present

## 2020-04-03 DIAGNOSIS — E273 Drug-induced adrenocortical insufficiency: Secondary | ICD-10-CM | POA: Diagnosis not present

## 2020-04-03 DIAGNOSIS — K280 Acute gastrojejunal ulcer with hemorrhage: Secondary | ICD-10-CM | POA: Diagnosis not present

## 2020-04-03 DIAGNOSIS — I10 Essential (primary) hypertension: Secondary | ICD-10-CM | POA: Diagnosis not present

## 2020-04-03 DIAGNOSIS — T451X5S Adverse effect of antineoplastic and immunosuppressive drugs, sequela: Secondary | ICD-10-CM | POA: Diagnosis not present

## 2020-04-03 DIAGNOSIS — N400 Enlarged prostate without lower urinary tract symptoms: Secondary | ICD-10-CM | POA: Diagnosis not present

## 2020-04-03 DIAGNOSIS — K862 Cyst of pancreas: Secondary | ICD-10-CM | POA: Diagnosis not present

## 2020-04-03 DIAGNOSIS — T8189XD Other complications of procedures, not elsewhere classified, subsequent encounter: Secondary | ICD-10-CM | POA: Diagnosis not present

## 2020-04-03 DIAGNOSIS — R918 Other nonspecific abnormal finding of lung field: Secondary | ICD-10-CM | POA: Diagnosis not present

## 2020-04-03 DIAGNOSIS — Z9181 History of falling: Secondary | ICD-10-CM | POA: Diagnosis not present

## 2020-04-03 DIAGNOSIS — E890 Postprocedural hypothyroidism: Secondary | ICD-10-CM | POA: Diagnosis not present

## 2020-04-03 DIAGNOSIS — E538 Deficiency of other specified B group vitamins: Secondary | ICD-10-CM | POA: Diagnosis not present

## 2020-04-03 DIAGNOSIS — D508 Other iron deficiency anemias: Secondary | ICD-10-CM | POA: Diagnosis not present

## 2020-04-03 NOTE — Patient Outreach (Signed)
[facility]) Care Management  [place]  04/03/2020   Devantae Babe 11/23/1945 [id number]  Subjective: Telephone outreach PAC another GI Bleed (#4).   Encounter Medications:  Outpatient Encounter Medications as of 04/03/2020  Medication Sig  . acetaminophen (TYLENOL) 325 MG tablet Take 2 tablets (650 mg total) by mouth every 6 (six) hours as needed for moderate pain.  . calcium carbonate (OS-CAL - DOSED IN MG OF ELEMENTAL CALCIUM) 1250 (500 Ca) MG tablet Take 1 tablet by mouth daily with supper.  . Cholecalciferol (VITAMIN D) 2000 UNITS tablet Take 1 tablet (2,000 Units total) by mouth daily.  . clonazePAM (KLONOPIN) 1 MG tablet Take 1 tablet (1 mg total) by mouth daily as needed. (Patient taking differently: Take 1 mg by mouth daily.)  . escitalopram (LEXAPRO) 10 MG tablet TAKE 1 TABLET(10 MG) BY MOUTH DAILY (Patient taking differently: Take 10 mg by mouth daily.)  . feeding supplement (ENSURE ENLIVE / ENSURE PLUS) LIQD Take 237 mLs by mouth 2 (two) times daily between meals.  . hydrocortisone (ANUSOL-HC) 25 MG suppository 1 suppository every evening for 7 days,1 suppository every other evening (Patient taking differently: Place 25 mg rectally daily.)  . levothyroxine (SYNTHROID) 88 MCG tablet Take 1 tablet (88 mcg total) by mouth daily before breakfast.  . Multiple Vitamin (MULTIVITAMIN WITH MINERALS) TABS tablet Take 1 tablet by mouth daily.  Marland Kitchen omeprazole (PRILOSEC) 40 MG capsule Take 1 capsule (40 mg total) by mouth 2 (two) times daily.  . Oxycodone HCl 10 MG TABS Take 1 tablet (10 mg total) by mouth every 6 (six) hours as needed (chronic pain - pelvic fx).  . polyethylene glycol (MIRALAX / GLYCOLAX) 17 g packet Take 17 g by mouth daily as needed for mild constipation or moderate constipation. (Patient not taking: No sig reported)  . predniSONE (DELTASONE) 5 MG tablet Take 1 tablet (5 mg total) by mouth daily with breakfast.  . sucralfate (CARAFATE) 1 GM/10ML  suspension Take 10 mLs (1 g total) by mouth 4 (four) times daily -  with meals and at bedtime for 14 days.  . tamsulosin (FLOMAX) 0.4 MG CAPS capsule TAKE 1 CAPSULE(0.4 MG) BY MOUTH DAILY AFTER SUPPER (Patient taking differently: Take 0.4 mg by mouth daily.)   No facility-administered encounter medications on file as of 04/03/2020.    Fession Screening: Fall Risk  02/09/2020 03/28/2018 01/10/2018  Falls in the past year? 0 0 0  Number falls in past yr: 0 - 0  Injury with Fall? 1 - -  Risk for fall due to : Impaired balance/gait - -  Follow up Falls evaluation completed - -   PHQ 2/9 Scores 02/09/2020 03/31/2018 03/28/2018 03/28/2018 01/10/2018 01/07/2017 11/01/2014  PHQ - 2 Score 0 1 0 0 5 0 2  PHQ- 9 Score - 3 0 - 12 - 4    Assessment: Post GI bleed                        Pelvic Fx  Goals Addressed              This Visit's Progress     Patient Stated   .  I will observe and practice fall precautions to prevent a fall over the next month. (pt-stated)        03/13/20 - start date High Priority Expected end date: 04/11/20  03/13/20 Pt has not fallen since his pelvic fx. Reinforced fall precautions: Arise slowly, stand for 30 sec  count, wear non skid slippers or socks, use walker, keep pathways clear, avoid hazards (like water on the floor) avoid movements that are risky, complete PT. 04/03/20 No falls. However, had another GI Bleed and endoscopic surgery which placed a lot of pressure on his pelvic fx. Xrays indicated it did not refx but healing has slowed. He will resume PT today.    .  Will work with PT to regain mobility and be able to walk without assistance over the next 2 months. (pt-stated)        03/13/20 Long term goal  Start date 03/13/20 Expected end date 06/01/20  03/13/20 Discussed pt personal goals. He hopes to be able to walk without his walker in 2 months. He will resume PT tomorrow. NP has encouraged him to participate 100% in the therapy and to do the recommended exercises on other  days. 04/03/20 Pt has had a set back, another hospitalization  (5 days) for another GI Bleed. The specialists are hopeful that they found the area of concern and have corrected the problem. He will restart his PT today. He feels the endoscopy positioning which was on the area of the fracture did not help in the healing process. He is very sore. He did talk with his provider about pain medication to help him get through the therapy and she is working with him on that. Encouraged him to give 100% during his PT sessions  and do the recommended exercises all other days. Continue safety practices to avoid falls.      Other   .  Will complete all annual wellness requirements that are due by the end of May.        Start date 03/13/20 Medium priority Expected end date: 07/02/20  03/13/20 Nurse reviewed gaps in care after conversation and will need to verify if he has completed his COVID vaccination series and had Tdap. All other wellness items have been verified as done. 04/03/20 Review of immunizations completed, pt states he has had all his vaccines but not listed in Epic. Have requested that he provide dates of his COVID and Td vaccinations.       Plan: Will call again in one week.  Follow-up:  Patient agrees to Care Plan and Follow-up.  [name] Myrtie Neither, MSN, [facility] Nurse Practitioner [facility] Care Management [phone]

## 2020-04-05 DIAGNOSIS — R918 Other nonspecific abnormal finding of lung field: Secondary | ICD-10-CM | POA: Diagnosis not present

## 2020-04-05 DIAGNOSIS — D62 Acute posthemorrhagic anemia: Secondary | ICD-10-CM | POA: Diagnosis not present

## 2020-04-05 DIAGNOSIS — K280 Acute gastrojejunal ulcer with hemorrhage: Secondary | ICD-10-CM | POA: Diagnosis not present

## 2020-04-05 DIAGNOSIS — E890 Postprocedural hypothyroidism: Secondary | ICD-10-CM | POA: Diagnosis not present

## 2020-04-05 DIAGNOSIS — S32502D Unspecified fracture of left pubis, subsequent encounter for fracture with routine healing: Secondary | ICD-10-CM | POA: Diagnosis not present

## 2020-04-05 DIAGNOSIS — T8189XD Other complications of procedures, not elsewhere classified, subsequent encounter: Secondary | ICD-10-CM | POA: Diagnosis not present

## 2020-04-08 ENCOUNTER — Encounter: Payer: Self-pay | Admitting: Cardiovascular Disease

## 2020-04-08 ENCOUNTER — Other Ambulatory Visit: Payer: Self-pay

## 2020-04-08 ENCOUNTER — Ambulatory Visit (INDEPENDENT_AMBULATORY_CARE_PROVIDER_SITE_OTHER): Payer: Medicare Other | Admitting: Cardiovascular Disease

## 2020-04-08 VITALS — BP 110/70 | HR 60 | Ht 71.0 in | Wt 192.2 lb

## 2020-04-08 DIAGNOSIS — R001 Bradycardia, unspecified: Secondary | ICD-10-CM | POA: Diagnosis not present

## 2020-04-08 DIAGNOSIS — I350 Nonrheumatic aortic (valve) stenosis: Secondary | ICD-10-CM | POA: Diagnosis not present

## 2020-04-08 NOTE — Patient Instructions (Signed)

## 2020-04-08 NOTE — Progress Notes (Signed)
Chief Complaint  Patient presents with  . Follow-up    Bradycardia   History of Present Illness: 75 yo male with history of sinus bradycardia, BPH, HTN, HLD, iron deficiency anemia, melanoma with brain mets, obesity s/p gastric bypass who is here today for follow up. I met him in March 2021 as a new patient for evaluation of bradycardia. He had been followed by Dr. Charolette Forward and saw him last in October 2020. He had an episode of dizziness in July 2020 and his heart rate was in the 30s. He was seen in the ED and had PVCs with bigeminy. He had an echo and a stress test in Dr. Zenia Resides office. Echo 08/30/18: LVEF=50%. Grade 1 diastolic dysfunction. Mild LVH. Mild MR. Mild AS. Nuclear stress test 08/24/18:No ischemia. He has metastatic melanoma and has had treatment at [facility] and is in remission. He had a tumor removal and radiation. He has adrenal insufficiency post immunotherapy and is on daily Prednisone. He was asymptomatic when I saw him in 2021. He was admitted to [facility] February 2022 with upper GI bleeding due to AVMs.   He is here today for follow up. The patient denies any chest pain, dyspnea, palpitations, lower extremity edema, orthopnea, PND, dizziness, near syncope or syncope.   Primary Care Physician: [name], MD   Past Medical History:  Diagnosis Date  . B12 nutritional deficiency    malabsorbtion  . BPH (benign prostatic hypertrophy)   . Diverticulosis   . GI bleeding 02/2020  . Hypertension   . Iron deficiency anemia    malabsorption related (s/p gastric bypass)  . Melanoma ([place])   . Obesity    s/p gastric bypass 2000, start 355#  . Pancreatic cyst   . Pulmonary nodules     Past Surgical History:  Procedure Laterality Date  . COLONOSCOPY WITH PROPOFOL N/A 03/05/2020   Procedure: COLONOSCOPY WITH PROPOFOL;  Surgeon: Rush Landmark Telford Nab., MD;  Location: [place];  Service: Gastroenterology;  Laterality: N/A;  . CRANIOTOMY FOR TUMOR  12/11/2015   metastatic  melanoma  . ENTEROSCOPY N/A 03/30/2020   Procedure: ENTEROSCOPY;  Surgeon: Irene Shipper, MD;  Location: [facility];  Service: Endoscopy;  Laterality: N/A;  . ESOPHAGOGASTRODUODENOSCOPY N/A 03/30/2020   Procedure: ESOPHAGOGASTRODUODENOSCOPY (EGD);  Surgeon: Irene Shipper, MD;  Location: [facility];  Service: Endoscopy;  Laterality: N/A;  . ESOPHAGOGASTRODUODENOSCOPY (EGD) WITH PROPOFOL N/A 02/14/2020   Procedure: ESOPHAGOGASTRODUODENOSCOPY (EGD) WITH PROPOFOL;  Surgeon: Lavena Bullion, DO;  Location: [facility] ENDOSCOPY;  Service: Endoscopy;  Laterality: N/A;  . ESOPHAGOGASTRODUODENOSCOPY (EGD) WITH PROPOFOL N/A 02/26/2020   Procedure: ESOPHAGOGASTRODUODENOSCOPY (EGD) WITH PROPOFOL;  Surgeon: Irene Shipper, MD;  [facility] ENDOSCOPY;  Service: Endoscopy;  Laterality: N/A;  . ESOPHAGOGASTRODUODENOSCOPY (EGD) WITH PROPOFOL N/A 03/03/2020   Procedure: ESOPHAGOGASTRODUODENOSCOPY (EGD) WITH PROPOFOL;  Surgeon: Carol Ada, MD;  Location: [place];  Service: Endoscopy;  Laterality: N/A;  . ESOPHAGOGASTRODUODENOSCOPY (EGD) WITH PROPOFOL N/A 03/28/2020   Procedure: ESOPHAGOGASTRODUODENOSCOPY (EGD) WITH PROPOFOL;  Surgeon: Irene Shipper, MD;  Location: [facility] ENDOSCOPY;  Service: Endoscopy;  Laterality: N/A;  . FOREIGN BODY REMOVAL  03/30/2020   Procedure: FOREIGN BODY REMOVAL;  Surgeon: Irene Shipper, MD;  Location: [facility] ENDOSCOPY;  Service: Endoscopy;;  . GIVENS CAPSULE STUDY N/A 03/28/2020   Procedure: GIVENS CAPSULE STUDY;  Surgeon: Irene Shipper, MD;  Location: [facility] ENDOSCOPY;  Service: Endoscopy;  Laterality: N/A;  . HEMOSTASIS CONTROL  02/26/2020   Procedure: HEMOSTASIS CONTROL;  Surgeon: Irene Shipper,  MD;  Location: [place];  Service: Endoscopy;;  . HEMOSTASIS CONTROL  03/30/2020   Procedure: HEMOSTASIS CONTROL;  Surgeon: Irene Shipper, MD;  Location: [facility] ENDOSCOPY;  Service: Endoscopy;;  . HOT HEMOSTASIS N/A 02/26/2020   Procedure: HOT HEMOSTASIS (ARGON PLASMA COAGULATION/BICAP);  Surgeon: Irene Shipper, MD;  Location[facility] ENDOSCOPY;  Service: Endoscopy;  Laterality: N/A;  . MOHS SURGERY  2012   melanoma, mid back, [place]  . ROUX-EN-Y PROCEDURE  2000  . TOTAL SHOULDER REPLACEMENT Right 2011   [place]    Current Outpatient Medications  Medication Sig Dispense Refill  . acetaminophen (TYLENOL) 325 MG tablet Take 2 tablets (650 mg total) by mouth every 6 (six) hours as needed for moderate pain.    . calcium carbonate (OS-CAL - DOSED IN MG OF ELEMENTAL CALCIUM) 1250 (500 Ca) MG tablet Take 1 tablet by mouth daily with supper.    . Cholecalciferol (VITAMIN D) 2000 UNITS tablet Take 1 tablet (2,000 Units total) by mouth daily. 30 tablet 11  . clonazePAM (KLONOPIN) 1 MG tablet Take 1 tablet (1 mg total) by mouth daily as needed. (Patient taking differently: Take 1 mg by mouth daily.) 5 tablet 0  . escitalopram (LEXAPRO) 10 MG tablet TAKE 1 TABLET(10 MG) BY MOUTH DAILY (Patient taking differently: Take 10 mg by mouth daily.) 90 tablet 0  . feeding supplement (ENSURE ENLIVE / ENSURE PLUS) LIQD Take 237 mLs by mouth 2 (two) times daily between meals. 14220 mL 0  . hydrocortisone (ANUSOL-HC) 25 MG suppository 1 suppository every evening for 7 days,1 suppository every other evening (Patient taking differently: Place 25 mg rectally daily.) 24 suppository 0  . levothyroxine (SYNTHROID) 88 MCG tablet Take 1 tablet (88 mcg total) by mouth daily before breakfast. 90 tablet 3  . Multiple Vitamin (MULTIVITAMIN WITH MINERALS) TABS tablet Take 1 tablet by mouth daily.    Marland Kitchen omeprazole (PRILOSEC) 40 MG capsule Take 1 capsule (40 mg total) by mouth 2 (two) times daily. 60 capsule 6  . Oxycodone HCl 10 MG TABS Take 1 tablet (10 mg total) by mouth every 6 (six) hours as needed (chronic pain - pelvic fx). 120 tablet 0  . polyethylene glycol (MIRALAX / GLYCOLAX) 17 g packet Take 17 g by mouth daily as needed for mild constipation or moderate constipation. 14 each 0  . predniSONE (DELTASONE) 5 MG tablet Take 1  tablet (5 mg total) by mouth daily with breakfast. 120 tablet 3  . sucralfate (CARAFATE) 1 GM/10ML suspension Take 10 mLs (1 g total) by mouth 4 (four) times daily -  with meals and at bedtime for 14 days. 560 mL 0  . tamsulosin (FLOMAX) 0.4 MG CAPS capsule TAKE 1 CAPSULE(0.4 MG) BY MOUTH DAILY AFTER SUPPER (Patient taking differently: Take 0.4 mg by mouth daily.) 90 capsule 1   No current facility-administered medications for this visit.    No Known Allergies  Social History   Socioeconomic History  . Marital status: Married    Spouse name: Not on file  . Number of children: 2  . Years of education: Not on file  . Highest education level: Not on file  Occupational History  . Occupation: general contractor-retired  Tobacco Use  . Smoking status: Former Smoker    Quit date: 02/03/1963    Years since quitting: 57.2  . Smokeless tobacco: Never Used  Vaping Use  . Vaping Use: Never used  Substance and Sexual Activity  . Alcohol use: Not Currently    Comment:  wine 2x/week  . Drug use: Never  . Sexual activity: Not on file  Other Topics Concern  . 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 Worried About Charity fundraiser in the Last Year: Never true  . Ran Out of Food in the Last Year: Never true  Transportation Needs: No Transportation Needs  . Lack of Transportation (Medical): No  . Lack of Transportation (Non-Medical): No  Physical Activity: Not on file  Stress: Not on file  Social Connections: Not on file  Intimate Partner Violence: Not on file    Family History  Problem Relation Age of Onset  . Hypertension Father   . Macular degeneration Mother   . Melanoma Mother   . Melanoma Sister   . Breast cancer Sister   . Uterine cancer Sister   . Stomach cancer Sister    . Melanoma Brother     Review of Systems:  As stated in the HPI and otherwise negative.   BP 110/70   Pulse 60   Ht 5' 11"  (1.803 m)   Wt 192 lb 3.2 oz (87.2 kg)   SpO2 97%   BMI 26.81 kg/m   Physical Examination: General: Well developed, well nourished, NAD  HEENT: OP clear, mucus membranes moist  SKIN: warm, dry. No rashes. Neuro: No focal deficits  Musculoskeletal: Muscle strength 5/5 all ext  Psychiatric: Mood and affect normal  Neck: No JVD, no carotid bruits, no thyromegaly, no lymphadenopathy.  Lungs:Clear bilaterally, no wheezes, rhonci, crackles Cardiovascular: Regular rate and rhythm. No murmurs, gallops or rubs. Abdomen:Soft. Bowel sounds present. Non-tender.  Extremities: No lower extremity edema. Pulses are 2 + in the bilateral DP/PT.  EKG:  EKG is ordered today. The ekg ordered today demonstrates Sinus  Recent Labs: 02/20/2020: Magnesium 2.0 03/04/2020: TSH 0.234 03/27/2020: ALT 14 04/01/2020: BUN 13; Creatinine, Ser 0.95; Hemoglobin 10.3; Platelets 140; Potassium 3.6; Sodium 141   Lipid Panel    Component Value Date/Time   CHOL 195 01/07/2017 1034   TRIG 152.0 (H) 01/07/2017 1034   HDL 73.20 01/07/2017 1034   CHOLHDL 3 01/07/2017 1034   VLDL 30.4 01/07/2017 1034   LDLCALC 91 01/07/2017 1034     Wt Readings from Last 3 Encounters:  04/08/20 192 lb 3.2 oz (87.2 kg)  03/27/20 201 lb 15.1 oz (91.6 kg)  03/12/20 202 lb (91.6 kg)    Assessment and Plan:   1. Sinus bradycardia: No symptoms of dizziness or weakness. No near syncope. Continue to follow.   2. Aortic stenosis: Mild by echo in July 2020. Repeat echo in July 2023.   Current medicines are reviewed at length with the patient today.  The patient does not have concerns regarding medicines.  The following changes have been made:  no change  Labs/ tests ordered today include:   Orders Placed This Encounter  Procedures  . EKG 12-Lead   Disposition:   FU with me in 12 months.    Signed, Lauree Chandler, MD 04/08/2020 11:25 AM    [facility] [address], [place], [place]  [address] Phone: [phone]; Fax: [phone]

## 2020-04-10 ENCOUNTER — Other Ambulatory Visit: Payer: Self-pay | Admitting: Internal Medicine

## 2020-04-10 ENCOUNTER — Other Ambulatory Visit (INDEPENDENT_AMBULATORY_CARE_PROVIDER_SITE_OTHER): Payer: Medicare Other

## 2020-04-10 ENCOUNTER — Other Ambulatory Visit: Payer: Self-pay | Admitting: *Deleted

## 2020-04-10 DIAGNOSIS — K254 Chronic or unspecified gastric ulcer with hemorrhage: Secondary | ICD-10-CM | POA: Diagnosis not present

## 2020-04-10 LAB — CBC WITH DIFFERENTIAL/PLATELET
Basophils Absolute: 0.1 10*3/uL (ref 0.0–0.1)
Basophils Relative: 0.8 % (ref 0.0–3.0)
Eosinophils Absolute: 0 10*3/uL (ref 0.0–0.7)
Eosinophils Relative: 0.6 % (ref 0.0–5.0)
HCT: 36.2 % — ABNORMAL LOW (ref 39.0–52.0)
Hemoglobin: 11.7 g/dL — ABNORMAL LOW (ref 13.0–17.0)
Lymphocytes Relative: 11.5 % — ABNORMAL LOW (ref 12.0–46.0)
Lymphs Abs: 0.7 10*3/uL (ref 0.7–4.0)
MCHC: 32.4 g/dL (ref 30.0–36.0)
MCV: 90.3 fl (ref 78.0–100.0)
Monocytes Absolute: 0.3 10*3/uL (ref 0.1–1.0)
Monocytes Relative: 4.7 % (ref 3.0–12.0)
Neutro Abs: 5.2 10*3/uL (ref 1.4–7.7)
Neutrophils Relative %: 82.4 % — ABNORMAL HIGH (ref 43.0–77.0)
Platelets: 260 10*3/uL (ref 150.0–400.0)
RBC: 4.01 Mil/uL — ABNORMAL LOW (ref 4.22–5.81)
RDW: 15.4 % (ref 11.5–15.5)
WBC: 6.3 10*3/uL (ref 4.0–10.5)

## 2020-04-10 NOTE — Patient Outreach (Signed)
[facility]) Care Management  [place]  04/10/2020   Kegan Mckeithan 11-02-1945 [id number]  Subjective: Telephone outreach to follow up on recurrent GI bleeding and progress with PT. Pt gleefully reports no bleeding this week! He feels confident his last procedure has resolved the problem. He is continuing PT with steady progress.  Encounter Medications:  Outpatient Encounter Medications as of 04/10/2020  Medication Sig  . acetaminophen (TYLENOL) 325 MG tablet Take 2 tablets (650 mg total) by mouth every 6 (six) hours as needed for moderate pain.  . calcium carbonate (OS-CAL - DOSED IN MG OF ELEMENTAL CALCIUM) 1250 (500 Ca) MG tablet Take 1 tablet by mouth daily with supper.  . Cholecalciferol (VITAMIN D) 2000 UNITS tablet Take 1 tablet (2,000 Units total) by mouth daily.  . clonazePAM (KLONOPIN) 1 MG tablet Take 1 tablet (1 mg total) by mouth 2 (two) times daily as needed for anxiety.  Marland Kitchen escitalopram (LEXAPRO) 10 MG tablet TAKE 1 TABLET(10 MG) BY MOUTH DAILY (Patient taking differently: Take 10 mg by mouth daily.)  . feeding supplement (ENSURE ENLIVE / ENSURE PLUS) LIQD Take 237 mLs by mouth 2 (two) times daily between meals.  . hydrocortisone (ANUSOL-HC) 25 MG suppository 1 suppository every evening for 7 days,1 suppository every other evening (Patient taking differently: Place 25 mg rectally daily.)  . levothyroxine (SYNTHROID) 88 MCG tablet Take 1 tablet (88 mcg total) by mouth daily before breakfast.  . Multiple Vitamin (MULTIVITAMIN WITH MINERALS) TABS tablet Take 1 tablet by mouth daily.  Marland Kitchen omeprazole (PRILOSEC) 40 MG capsule Take 1 capsule (40 mg total) by mouth 2 (two) times daily.  . Oxycodone HCl 10 MG TABS Take 1 tablet (10 mg total) by mouth every 6 (six) hours as needed (chronic pain - pelvic fx).  . polyethylene glycol (MIRALAX / GLYCOLAX) 17 g packet Take 17 g by mouth daily as needed for mild constipation or moderate constipation.  . predniSONE  (DELTASONE) 5 MG tablet Take 1 tablet (5 mg total) by mouth daily with breakfast.  . sucralfate (CARAFATE) 1 GM/10ML suspension Take 10 mLs (1 g total) by mouth 4 (four) times daily -  with meals and at bedtime for 14 days.  . tamsulosin (FLOMAX) 0.4 MG CAPS capsule TAKE 1 CAPSULE(0.4 MG) BY MOUTH DAILY AFTER SUPPER (Patient taking differently: Take 0.4 mg by mouth daily.)  . [DISCONTINUED] clonazePAM (KLONOPIN) 1 MG tablet Take 1 tablet (1 mg total) by mouth daily as needed. (Patient taking differently: Take 1 mg by mouth daily.)   No facility-administered encounter medications on file as of 04/10/2020.    Functional Status:  In your present state of health, do you have any difficulty performing the following activities: 03/27/2020 03/27/2020  Hearing? - N  Vision? - N  Difficulty concentrating or making decisions? - N  Walking or climbing stairs? - Y  Comment - -  Dressing or bathing? - Y  Doing errands, shopping? Y -  Some recent data might be hidden    Fall/Depression Screening: Fall Risk  02/09/2020 03/28/2018 01/10/2018  Falls in the past year? 0 0 0  Number falls in past yr: 0 - 0  Injury with Fall? 1 - -  Risk for fall due to : Impaired balance/gait - -  Follow up Falls evaluation completed - -   PHQ 2/9 Scores 02/09/2020 03/31/2018 03/28/2018 03/28/2018 01/10/2018 01/07/2017 11/01/2014  PHQ - 2 Score 0 1 0 0 5 0 2  PHQ- 9 Score - 3 0 -  12 - 4    Assessment:  GI Bleed Resolved                         Pelvic Fx - Mobility is improving with PT.  Goals Addressed              This Visit's Progress     Patient Stated   .  COMPLETED: I will observe and practice fall precautions to prevent a fall over the next month. (pt-stated)        03/13/20 - start date High Priority Expected end date: 04/11/20  03/13/20 Pt has not fallen since his pelvic fx. Reinforced fall precautions: Arise slowly, stand for 30 sec count, wear non skid slippers or socks, use walker, keep pathways clear, avoid  hazards (like water on the floor) avoid movements that are risky, complete PT. 04/03/20 No falls. However, had another GI Bleed and endoscopic surgery which placed a lot of pressure on his pelvic fx. Xrays indicated it did not refx but healing has slowed. He will resume PT today. 04/10/20 No falls. Continues PT and since he had so many set backs with recurrent GI bleeding over the last 6 weeks, he will continue PT for another 4 weeks and then the plan is for him to do some outpt PT also. Reinforced fall precautions.    .  Will work with PT to regain mobility and be able to walk without assistance over the next 2 months. (pt-stated)   On track     03/13/20 Long term goal  Start date 03/13/20 Expected end date 06/01/20  03/13/20 Discussed pt personal goals. He hopes to be able to walk without his walker in 2 months. He will resume PT tomorrow. NP has encouraged him to participate 100% in the therapy and to do the recommended exercises on other days. 04/03/20 Pt has had a set back, another hospitalization  (5 days) for another GI Bleed. The specialists are hopeful that they found the area of concern and have corrected the problem. He will restart his PT today. He feels the endoscopy positioning which was on the area of the fracture did not help in the healing process. He is very sore. He did talk with his provider about pain medication to help him get through the therapy and she is working with him on that. Encouraged him to give 100% during his PT sessions  and do the recommended exercises all other days. Continue safety practices to avoid falls. 04/10/20 Pt has not had any health occurances for a week and has been able to get back to his PT. This is going well per his report. He still has pain but expects this will get better as he is able to move more freely. Encouraged exercises on days PT does not come. He has not had any falls.      Other   .  Will complete all annual wellness requirements that are due by the end of  May.   On track     Start date 03/13/20 Medium priority Expected end date: 07/02/20  03/13/20 Nurse reviewed gaps in care after conversation and will need to verify if he has completed his COVID vaccination series and had Tdap. All other wellness items have been verified as done. 04/03/20 Review of immunizations completed, pt states he has had all his vaccines but not listed in Epic. Have requested that he provide dates of his COVID and Td vaccinations. 04/10/20 Pt  provided dates for his COVID vaccine series, these have been added to the Epic EMR Needs Tdap and pt agrees to get this.       Plan:  Pt agrees to follow up in one month.  Follow-up:  Patient agrees to Care Plan and Follow-up.   [name] Myrtie Neither, MSN, [facility] Gerontological Nurse Practitioner [facility] Care Management [phone]

## 2020-04-11 ENCOUNTER — Telehealth: Payer: Self-pay | Admitting: Physician Assistant

## 2020-04-11 ENCOUNTER — Encounter: Payer: Self-pay | Admitting: Internal Medicine

## 2020-04-11 DIAGNOSIS — K280 Acute gastrojejunal ulcer with hemorrhage: Secondary | ICD-10-CM | POA: Diagnosis not present

## 2020-04-11 DIAGNOSIS — D62 Acute posthemorrhagic anemia: Secondary | ICD-10-CM | POA: Diagnosis not present

## 2020-04-11 DIAGNOSIS — E890 Postprocedural hypothyroidism: Secondary | ICD-10-CM | POA: Diagnosis not present

## 2020-04-11 DIAGNOSIS — T8189XD Other complications of procedures, not elsewhere classified, subsequent encounter: Secondary | ICD-10-CM | POA: Diagnosis not present

## 2020-04-11 DIAGNOSIS — S32502D Unspecified fracture of left pubis, subsequent encounter for fracture with routine healing: Secondary | ICD-10-CM | POA: Diagnosis not present

## 2020-04-11 DIAGNOSIS — R918 Other nonspecific abnormal finding of lung field: Secondary | ICD-10-CM | POA: Diagnosis not present

## 2020-04-11 LAB — IRON,TIBC AND FERRITIN PANEL
%SAT: 17 % (calc) — ABNORMAL LOW (ref 20–48)
Ferritin: 139 ng/mL (ref 24–380)
Iron: 32 ug/dL — ABNORMAL LOW (ref 50–180)
TIBC: 188 mcg/dL (calc) — ABNORMAL LOW (ref 250–425)

## 2020-04-11 NOTE — Telephone Encounter (Signed)
[name] PA, please see labs drawn yesterday by Dr. Quay Burow

## 2020-04-12 DIAGNOSIS — S32502D Unspecified fracture of left pubis, subsequent encounter for fracture with routine healing: Secondary | ICD-10-CM | POA: Diagnosis not present

## 2020-04-12 DIAGNOSIS — E890 Postprocedural hypothyroidism: Secondary | ICD-10-CM | POA: Diagnosis not present

## 2020-04-12 DIAGNOSIS — D62 Acute posthemorrhagic anemia: Secondary | ICD-10-CM | POA: Diagnosis not present

## 2020-04-12 DIAGNOSIS — T8189XD Other complications of procedures, not elsewhere classified, subsequent encounter: Secondary | ICD-10-CM | POA: Diagnosis not present

## 2020-04-12 DIAGNOSIS — K280 Acute gastrojejunal ulcer with hemorrhage: Secondary | ICD-10-CM | POA: Diagnosis not present

## 2020-04-12 DIAGNOSIS — R918 Other nonspecific abnormal finding of lung field: Secondary | ICD-10-CM | POA: Diagnosis not present

## 2020-04-12 NOTE — Telephone Encounter (Signed)
Ok, thanks, HGB is improving -needs repeat HGB in 2 weeks

## 2020-04-15 ENCOUNTER — Other Ambulatory Visit (INDEPENDENT_AMBULATORY_CARE_PROVIDER_SITE_OTHER): Payer: Medicare Other

## 2020-04-15 ENCOUNTER — Encounter: Payer: Self-pay | Admitting: Internal Medicine

## 2020-04-15 ENCOUNTER — Other Ambulatory Visit: Payer: Self-pay

## 2020-04-15 DIAGNOSIS — D649 Anemia, unspecified: Secondary | ICD-10-CM

## 2020-04-15 DIAGNOSIS — K254 Chronic or unspecified gastric ulcer with hemorrhage: Secondary | ICD-10-CM

## 2020-04-15 LAB — CBC
HCT: 36.4 % — ABNORMAL LOW (ref 39.0–52.0)
Hemoglobin: 11.8 g/dL — ABNORMAL LOW (ref 13.0–17.0)
MCHC: 32.4 g/dL (ref 30.0–36.0)
MCV: 89.2 fl (ref 78.0–100.0)
Platelets: 258 10*3/uL (ref 150.0–400.0)
RBC: 4.08 Mil/uL — ABNORMAL LOW (ref 4.22–5.81)
RDW: 15.3 % (ref 11.5–15.5)
WBC: 7.4 10*3/uL (ref 4.0–10.5)

## 2020-04-15 NOTE — Telephone Encounter (Signed)
Left message for patient to call back New labs entered for 2 weeks

## 2020-04-16 ENCOUNTER — Other Ambulatory Visit: Payer: Self-pay

## 2020-04-16 ENCOUNTER — Encounter: Payer: Self-pay | Admitting: Internal Medicine

## 2020-04-16 DIAGNOSIS — D649 Anemia, unspecified: Secondary | ICD-10-CM

## 2020-04-16 DIAGNOSIS — D509 Iron deficiency anemia, unspecified: Secondary | ICD-10-CM

## 2020-04-16 LAB — IRON,TIBC AND FERRITIN PANEL
%SAT: 17 % (calc) — ABNORMAL LOW (ref 20–48)
Ferritin: 113 ng/mL (ref 24–380)
Iron: 33 ug/dL — ABNORMAL LOW (ref 50–180)
TIBC: 195 mcg/dL (calc) — ABNORMAL LOW (ref 250–425)

## 2020-04-16 NOTE — Telephone Encounter (Signed)
See mychart messages for additional details.

## 2020-04-16 NOTE — Telephone Encounter (Signed)
Left message for patient to call back  

## 2020-04-17 DIAGNOSIS — K280 Acute gastrojejunal ulcer with hemorrhage: Secondary | ICD-10-CM | POA: Diagnosis not present

## 2020-04-17 DIAGNOSIS — R918 Other nonspecific abnormal finding of lung field: Secondary | ICD-10-CM | POA: Diagnosis not present

## 2020-04-17 DIAGNOSIS — S32502D Unspecified fracture of left pubis, subsequent encounter for fracture with routine healing: Secondary | ICD-10-CM | POA: Diagnosis not present

## 2020-04-17 DIAGNOSIS — E890 Postprocedural hypothyroidism: Secondary | ICD-10-CM | POA: Diagnosis not present

## 2020-04-17 DIAGNOSIS — D62 Acute posthemorrhagic anemia: Secondary | ICD-10-CM | POA: Diagnosis not present

## 2020-04-17 DIAGNOSIS — T8189XD Other complications of procedures, not elsewhere classified, subsequent encounter: Secondary | ICD-10-CM | POA: Diagnosis not present

## 2020-04-19 DIAGNOSIS — T8189XD Other complications of procedures, not elsewhere classified, subsequent encounter: Secondary | ICD-10-CM | POA: Diagnosis not present

## 2020-04-19 DIAGNOSIS — K280 Acute gastrojejunal ulcer with hemorrhage: Secondary | ICD-10-CM | POA: Diagnosis not present

## 2020-04-19 DIAGNOSIS — S32502D Unspecified fracture of left pubis, subsequent encounter for fracture with routine healing: Secondary | ICD-10-CM | POA: Diagnosis not present

## 2020-04-19 DIAGNOSIS — E890 Postprocedural hypothyroidism: Secondary | ICD-10-CM | POA: Diagnosis not present

## 2020-04-19 DIAGNOSIS — D62 Acute posthemorrhagic anemia: Secondary | ICD-10-CM | POA: Diagnosis not present

## 2020-04-19 DIAGNOSIS — R918 Other nonspecific abnormal finding of lung field: Secondary | ICD-10-CM | POA: Diagnosis not present

## 2020-04-24 ENCOUNTER — Encounter: Payer: Self-pay | Admitting: Internal Medicine

## 2020-04-24 DIAGNOSIS — S32502D Unspecified fracture of left pubis, subsequent encounter for fracture with routine healing: Secondary | ICD-10-CM | POA: Diagnosis not present

## 2020-04-24 DIAGNOSIS — T8189XD Other complications of procedures, not elsewhere classified, subsequent encounter: Secondary | ICD-10-CM | POA: Diagnosis not present

## 2020-04-24 DIAGNOSIS — D62 Acute posthemorrhagic anemia: Secondary | ICD-10-CM | POA: Diagnosis not present

## 2020-04-24 DIAGNOSIS — R918 Other nonspecific abnormal finding of lung field: Secondary | ICD-10-CM | POA: Diagnosis not present

## 2020-04-24 DIAGNOSIS — K280 Acute gastrojejunal ulcer with hemorrhage: Secondary | ICD-10-CM | POA: Diagnosis not present

## 2020-04-24 DIAGNOSIS — E890 Postprocedural hypothyroidism: Secondary | ICD-10-CM | POA: Diagnosis not present

## 2020-04-24 MED ORDER — OXYCODONE HCL 10 MG PO TABS: 10.0000 mg | ORAL_TABLET | Freq: Four times a day (QID) | ORAL | 0 refills | Status: DC | PRN

## 2020-05-02 ENCOUNTER — Other Ambulatory Visit: Payer: Self-pay | Admitting: Internal Medicine

## 2020-05-02 ENCOUNTER — Telehealth: Payer: Self-pay | Admitting: Internal Medicine

## 2020-05-02 DIAGNOSIS — S32502D Unspecified fracture of left pubis, subsequent encounter for fracture with routine healing: Secondary | ICD-10-CM | POA: Diagnosis not present

## 2020-05-02 DIAGNOSIS — D62 Acute posthemorrhagic anemia: Secondary | ICD-10-CM | POA: Diagnosis not present

## 2020-05-02 DIAGNOSIS — R918 Other nonspecific abnormal finding of lung field: Secondary | ICD-10-CM | POA: Diagnosis not present

## 2020-05-02 DIAGNOSIS — E890 Postprocedural hypothyroidism: Secondary | ICD-10-CM | POA: Diagnosis not present

## 2020-05-02 DIAGNOSIS — S32512D Fracture of superior rim of left pubis, subsequent encounter for fracture with routine healing: Secondary | ICD-10-CM

## 2020-05-02 DIAGNOSIS — K280 Acute gastrojejunal ulcer with hemorrhage: Secondary | ICD-10-CM | POA: Diagnosis not present

## 2020-05-02 DIAGNOSIS — T8189XD Other complications of procedures, not elsewhere classified, subsequent encounter: Secondary | ICD-10-CM | POA: Diagnosis not present

## 2020-05-02 NOTE — Telephone Encounter (Signed)
Belenda Cruise from [facility] calling, states the patient is being discharged from [facility] today and is wondering if he could get a referral to [facility] on [address] to start PT next week.  Fax- [phone]

## 2020-05-08 ENCOUNTER — Other Ambulatory Visit: Payer: Self-pay

## 2020-05-08 ENCOUNTER — Other Ambulatory Visit: Payer: Self-pay | Admitting: *Deleted

## 2020-05-08 NOTE — Patient Outreach (Signed)
[facility]) Care Management  Maxwell Fox  05/08/2020   Maxwell Fox 12-20-1945 [id number]  Subjective: Telephone outreach to follow up on GI status, mobility, care gaps. No further bleeding, Hgb 11.+. Iron studies still low. Pt to have more lab work and he hopes to get an iron infusion as he does feel better when he receives this tx.  Encounter Medications:  Outpatient Encounter Medications as of 05/08/2020  Medication Sig  . acetaminophen (TYLENOL) 325 MG tablet Take 2 tablets (650 mg total) by mouth every 6 (six) hours as needed for moderate pain.  . calcium carbonate (OS-CAL - DOSED IN MG OF ELEMENTAL CALCIUM) 1250 (500 Ca) MG tablet Take 1 tablet by mouth daily with supper.  . Cholecalciferol (VITAMIN D) 2000 UNITS tablet Take 1 tablet (2,000 Units total) by mouth daily.  . clonazePAM (KLONOPIN) 1 MG tablet Take 1 tablet (1 mg total) by mouth 2 (two) times daily as needed for anxiety.  Marland Kitchen escitalopram (LEXAPRO) 10 MG tablet TAKE 1 TABLET(10 MG) BY MOUTH DAILY  . hydrocortisone (ANUSOL-HC) 25 MG suppository 1 suppository every evening for 7 days,1 suppository every other evening (Patient taking differently: Place 25 mg rectally daily.)  . levothyroxine (SYNTHROID) 88 MCG tablet Take 1 tablet (88 mcg total) by mouth daily before breakfast.  . Multiple Vitamin (MULTIVITAMIN WITH MINERALS) TABS tablet Take 1 tablet by mouth daily.  Marland Kitchen omeprazole (PRILOSEC) 40 MG capsule Take 1 capsule (40 mg total) by mouth 2 (two) times daily.  . Oxycodone HCl 10 MG TABS Take 1 tablet (10 mg total) by mouth every 6 (six) hours as needed (chronic pain - pelvic fx).  . polyethylene glycol (MIRALAX / GLYCOLAX) 17 g packet Take 17 g by mouth daily as needed for mild constipation or moderate constipation.  . predniSONE (DELTASONE) 5 MG tablet Take 1 tablet (5 mg total) by mouth daily with breakfast.  . sucralfate (CARAFATE) 1 GM/10ML suspension Take 10 mLs (1 g total) by mouth 4 (four)  times daily -  with meals and at bedtime for 14 days.  . tamsulosin (FLOMAX) 0.4 MG CAPS capsule TAKE 1 CAPSULE(0.4 MG) BY MOUTH DAILY AFTER SUPPER (Patient taking differently: Take 0.4 mg by mouth daily.)   No facility-administered encounter medications on file as of 05/08/2020.    Functional Status:  In your present state of health, do you have any difficulty performing the following activities: 03/27/2020 03/27/2020  Hearing? - N  Vision? - N  Difficulty concentrating or making decisions? - N  Walking or climbing stairs? - Y  Comment - -  Dressing or bathing? - Y  Doing errands, shopping? Y -  Some recent data might be hidden    Fall/Depression Screening: Fall Risk  02/09/2020 03/28/2018 01/10/2018  Falls in the past year? 0 0 0  Number falls in past yr: 0 - 0  Injury with Fall? 1 - -  Risk for fall due to : Impaired balance/gait - -  Follow up Falls evaluation completed - -   PHQ 2/9 Scores 02/09/2020 03/31/2018 03/28/2018 03/28/2018 01/10/2018 01/07/2017 11/01/2014  PHQ - 2 Score 0 1 0 0 5 0 2  PHQ- 9 Score - 3 0 - 12 - 4    Assessment:  Resolved GI Bleed                         Progressing mobility but not to pt desired goal.  Pelvic fx and pain                         Needs Tdap  Goals Addressed              This Visit's Progress     Patient Stated   .  Extend this goal for additional 2 months Starting 05/08/20: Will work with PT to regain mobility and be able to walk without assistance over the next 2 months. (pt-stated)   On track     03/13/20 Long term goal  Start date 03/13/20 Expected end date 06/01/20  03/13/20 Discussed pt personal goals. He hopes to be able to walk without his walker in 2 months. He will resume PT tomorrow. NP has encouraged him to participate 100% in the therapy and to do the recommended exercises on other days. 04/03/20 Pt has had a set back, another hospitalization  (5 days) for another GI Bleed. The specialists are hopeful that they  found the area of concern and have corrected the problem. He will restart his PT today. He feels the endoscopy positioning which was on the area of the fracture did not help in the healing process. He is very sore. He did talk with his provider about pain medication to help him get through the therapy and she is working with him on that. Encouraged him to give 100% during his PT sessions  and do the recommended exercises all other days. Continue safety practices to avoid falls. 04/10/20 Pt has not had any health occurances for a week and has been able to get back to his PT. This is going well per his report. He still has pain but expects this will get better as he is able to move more freely. Encouraged exercises on days PT does not come. He has not had any falls. 05/08/20 Pt has completed his in home PT. He is able to walk with a cane in his home and uses a walker if he leaves his home for stability. He denies any falls. He will begin outpt PT sometime this month. We will extend this goal another 2 months so he can reach his initial goal of walking without assistance which he should be able to reach. Reminded of fall precautions. Discussed informing his outpt PT about his hx of injury and set backs, share his fear of being hurt and set back again. Encouraged him to tell PT when he is uncomfortable.      Other   .  Will complete all annual wellness requirements that are due by the end of May.   On track     Start date 03/13/20 Medium priority Expected end date: 07/02/20  03/13/20 Nurse reviewed gaps in care after conversation and will need to verify if he has completed his COVID vaccination series and had Tdap. All other wellness items have been verified as done. 04/03/20 Review of immunizations completed, pt states he has had all his vaccines but not listed in Epic. Have requested that he provide dates of his COVID and Td vaccinations. 04/10/20 Pt provided dates for his COVID vaccine series, these have been added to  the Epic EMR Needs Tdap and pt agrees to get this. 05/08/20 [name] only has one care gap and that is to get his Tdap. Discussed the importance of getting all Medicare recommended preventive vaccines. Will send Dr. Quay Burow a message to remind her he needs this on his next appt. He is  willing to do so.       Plan: We agreed to follow up in one month, after he has his next endoscopy.  Follow-up:  Patient agrees to Care Plan and Follow-up.   [name] Myrtie Neither, MSN, [facility] Gerontological Nurse Practitioner [facility] Care Management [phone]

## 2020-05-09 MED ORDER — SUCRALFATE 1 GM/10ML PO SUSP: 1.0000 g | Freq: Three times a day (TID) | ORAL | 3 refills | Status: DC

## 2020-05-13 DIAGNOSIS — Z23 Encounter for immunization: Secondary | ICD-10-CM | POA: Diagnosis not present

## 2020-05-14 ENCOUNTER — Other Ambulatory Visit (INDEPENDENT_AMBULATORY_CARE_PROVIDER_SITE_OTHER): Payer: Medicare Other

## 2020-05-14 ENCOUNTER — Encounter: Payer: Self-pay | Admitting: Internal Medicine

## 2020-05-14 DIAGNOSIS — D509 Iron deficiency anemia, unspecified: Secondary | ICD-10-CM

## 2020-05-14 LAB — CBC WITH DIFFERENTIAL/PLATELET
Basophils Absolute: 0 10*3/uL (ref 0.0–0.1)
Basophils Relative: 0.6 % (ref 0.0–3.0)
Eosinophils Absolute: 0.1 10*3/uL (ref 0.0–0.7)
Eosinophils Relative: 0.8 % (ref 0.0–5.0)
HCT: 37.4 % — ABNORMAL LOW (ref 39.0–52.0)
Hemoglobin: 12.1 g/dL — ABNORMAL LOW (ref 13.0–17.0)
Lymphocytes Relative: 16.5 % (ref 12.0–46.0)
Lymphs Abs: 1.2 10*3/uL (ref 0.7–4.0)
MCHC: 32.2 g/dL (ref 30.0–36.0)
MCV: 85.9 fl (ref 78.0–100.0)
Monocytes Absolute: 0.5 10*3/uL (ref 0.1–1.0)
Monocytes Relative: 7.5 % (ref 3.0–12.0)
Neutro Abs: 5.3 10*3/uL (ref 1.4–7.7)
Neutrophils Relative %: 74.6 % (ref 43.0–77.0)
Platelets: 185 10*3/uL (ref 150.0–400.0)
RBC: 4.35 Mil/uL (ref 4.22–5.81)
RDW: 15.6 % — ABNORMAL HIGH (ref 11.5–15.5)
WBC: 7.1 10*3/uL (ref 4.0–10.5)

## 2020-05-15 LAB — IRON,TIBC AND FERRITIN PANEL
%SAT: 23 % (calc) (ref 20–48)
Ferritin: 74 ng/mL (ref 24–380)
Iron: 49 ug/dL — ABNORMAL LOW (ref 50–180)
TIBC: 211 mcg/dL (calc) — ABNORMAL LOW (ref 250–425)

## 2020-05-17 ENCOUNTER — Other Ambulatory Visit: Payer: Self-pay | Admitting: Internal Medicine

## 2020-05-20 ENCOUNTER — Encounter: Payer: Self-pay | Admitting: Internal Medicine

## 2020-05-21 MED ORDER — OXYCODONE HCL 10 MG PO TABS: 10.0000 mg | ORAL_TABLET | Freq: Four times a day (QID) | ORAL | 0 refills | Status: DC | PRN

## 2020-05-22 DIAGNOSIS — M6281 Muscle weakness (generalized): Secondary | ICD-10-CM | POA: Diagnosis not present

## 2020-05-22 DIAGNOSIS — M25652 Stiffness of left hip, not elsewhere classified: Secondary | ICD-10-CM | POA: Diagnosis not present

## 2020-05-22 DIAGNOSIS — S32512D Fracture of superior rim of left pubis, subsequent encounter for fracture with routine healing: Secondary | ICD-10-CM | POA: Diagnosis not present

## 2020-05-27 ENCOUNTER — Other Ambulatory Visit: Payer: Self-pay | Admitting: Internal Medicine

## 2020-05-27 DIAGNOSIS — S32512D Fracture of superior rim of left pubis, subsequent encounter for fracture with routine healing: Secondary | ICD-10-CM | POA: Diagnosis not present

## 2020-05-27 DIAGNOSIS — M25652 Stiffness of left hip, not elsewhere classified: Secondary | ICD-10-CM | POA: Diagnosis not present

## 2020-05-27 DIAGNOSIS — M6281 Muscle weakness (generalized): Secondary | ICD-10-CM | POA: Diagnosis not present

## 2020-05-30 DIAGNOSIS — M25652 Stiffness of left hip, not elsewhere classified: Secondary | ICD-10-CM | POA: Diagnosis not present

## 2020-05-30 DIAGNOSIS — S32512D Fracture of superior rim of left pubis, subsequent encounter for fracture with routine healing: Secondary | ICD-10-CM | POA: Diagnosis not present

## 2020-05-30 DIAGNOSIS — M6281 Muscle weakness (generalized): Secondary | ICD-10-CM | POA: Diagnosis not present

## 2020-06-03 DIAGNOSIS — C7931 Secondary malignant neoplasm of brain: Secondary | ICD-10-CM | POA: Diagnosis not present

## 2020-06-03 DIAGNOSIS — Z96611 Presence of right artificial shoulder joint: Secondary | ICD-10-CM | POA: Diagnosis not present

## 2020-06-03 DIAGNOSIS — G8929 Other chronic pain: Secondary | ICD-10-CM | POA: Diagnosis not present

## 2020-06-03 DIAGNOSIS — Z9889 Other specified postprocedural states: Secondary | ICD-10-CM | POA: Diagnosis not present

## 2020-06-03 DIAGNOSIS — C7951 Secondary malignant neoplasm of bone: Secondary | ICD-10-CM | POA: Diagnosis not present

## 2020-06-03 DIAGNOSIS — Z9884 Bariatric surgery status: Secondary | ICD-10-CM | POA: Diagnosis not present

## 2020-06-03 DIAGNOSIS — M549 Dorsalgia, unspecified: Secondary | ICD-10-CM | POA: Diagnosis not present

## 2020-06-03 DIAGNOSIS — L80 Vitiligo: Secondary | ICD-10-CM | POA: Diagnosis not present

## 2020-06-03 DIAGNOSIS — K8689 Other specified diseases of pancreas: Secondary | ICD-10-CM | POA: Diagnosis not present

## 2020-06-03 DIAGNOSIS — C439 Malignant melanoma of skin, unspecified: Secondary | ICD-10-CM | POA: Diagnosis not present

## 2020-06-03 DIAGNOSIS — C4359 Malignant melanoma of other part of trunk: Secondary | ICD-10-CM | POA: Diagnosis not present

## 2020-06-03 DIAGNOSIS — E2740 Unspecified adrenocortical insufficiency: Secondary | ICD-10-CM | POA: Diagnosis not present

## 2020-06-03 DIAGNOSIS — E039 Hypothyroidism, unspecified: Secondary | ICD-10-CM | POA: Diagnosis not present

## 2020-06-03 DIAGNOSIS — Z923 Personal history of irradiation: Secondary | ICD-10-CM | POA: Diagnosis not present

## 2020-06-03 DIAGNOSIS — W1839XA Other fall on same level, initial encounter: Secondary | ICD-10-CM | POA: Diagnosis not present

## 2020-06-03 DIAGNOSIS — Z79899 Other long term (current) drug therapy: Secondary | ICD-10-CM | POA: Diagnosis not present

## 2020-06-03 DIAGNOSIS — Z87891 Personal history of nicotine dependence: Secondary | ICD-10-CM | POA: Diagnosis not present

## 2020-06-03 DIAGNOSIS — C799 Secondary malignant neoplasm of unspecified site: Secondary | ICD-10-CM | POA: Diagnosis not present

## 2020-06-03 DIAGNOSIS — S32592A Other specified fracture of left pubis, initial encounter for closed fracture: Secondary | ICD-10-CM | POA: Diagnosis not present

## 2020-06-03 DIAGNOSIS — J181 Lobar pneumonia, unspecified organism: Secondary | ICD-10-CM | POA: Diagnosis not present

## 2020-06-03 DIAGNOSIS — R918 Other nonspecific abnormal finding of lung field: Secondary | ICD-10-CM | POA: Diagnosis not present

## 2020-06-04 DIAGNOSIS — S32512D Fracture of superior rim of left pubis, subsequent encounter for fracture with routine healing: Secondary | ICD-10-CM | POA: Diagnosis not present

## 2020-06-04 DIAGNOSIS — M25652 Stiffness of left hip, not elsewhere classified: Secondary | ICD-10-CM | POA: Diagnosis not present

## 2020-06-04 DIAGNOSIS — M6281 Muscle weakness (generalized): Secondary | ICD-10-CM | POA: Diagnosis not present

## 2020-06-05 ENCOUNTER — Ambulatory Visit (AMBULATORY_SURGERY_CENTER): Payer: Medicare Other

## 2020-06-05 ENCOUNTER — Other Ambulatory Visit: Payer: Self-pay

## 2020-06-05 VITALS — Ht 71.0 in | Wt 190.0 lb

## 2020-06-05 DIAGNOSIS — K254 Chronic or unspecified gastric ulcer with hemorrhage: Secondary | ICD-10-CM

## 2020-06-05 DIAGNOSIS — D649 Anemia, unspecified: Secondary | ICD-10-CM

## 2020-06-05 NOTE — Progress Notes (Signed)
Pre visit completed via phone call;  Patient verified name, DOB, and address; No egg or soy allergy known to patient  No issues with past sedation with any surgeries or procedures Patient denies ever being told they had issues or difficulty with intubation  No FH of Malignant Hyperthermia No diet pills per patient No home 02 use per patient  No blood thinners per patient  Pt denies issues with constipation  No A fib or A flutter  EMMI video via MyChart  COVID 19 guidelines implemented in [place] today with Pt and RN  Pt is fully vaccinated for Covid  Due to the COVID-19 pandemic we are asking patients to follow certain guidelines.  Pt aware of COVID protocols and [place] guidelines   Patient requested instructions be sent via MyChart due to closeness of appt date-instructions also mailed to patient  Patient reports dental implants

## 2020-06-06 DIAGNOSIS — S32512D Fracture of superior rim of left pubis, subsequent encounter for fracture with routine healing: Secondary | ICD-10-CM | POA: Diagnosis not present

## 2020-06-06 DIAGNOSIS — M6281 Muscle weakness (generalized): Secondary | ICD-10-CM | POA: Diagnosis not present

## 2020-06-06 DIAGNOSIS — M25652 Stiffness of left hip, not elsewhere classified: Secondary | ICD-10-CM | POA: Diagnosis not present

## 2020-06-10 ENCOUNTER — Ambulatory Visit (AMBULATORY_SURGERY_CENTER): Payer: Medicare Other | Admitting: Internal Medicine

## 2020-06-10 ENCOUNTER — Other Ambulatory Visit: Payer: Self-pay

## 2020-06-10 ENCOUNTER — Encounter: Payer: Medicare Other | Admitting: Internal Medicine

## 2020-06-10 ENCOUNTER — Encounter: Payer: Self-pay | Admitting: Internal Medicine

## 2020-06-10 VITALS — BP 145/82 | HR 53 | Temp 97.8°F | Resp 11 | Ht 71.0 in | Wt 190.0 lb

## 2020-06-10 DIAGNOSIS — D649 Anemia, unspecified: Secondary | ICD-10-CM

## 2020-06-10 DIAGNOSIS — K254 Chronic or unspecified gastric ulcer with hemorrhage: Secondary | ICD-10-CM

## 2020-06-10 DIAGNOSIS — K219 Gastro-esophageal reflux disease without esophagitis: Secondary | ICD-10-CM | POA: Diagnosis not present

## 2020-06-10 DIAGNOSIS — K259 Gastric ulcer, unspecified as acute or chronic, without hemorrhage or perforation: Secondary | ICD-10-CM | POA: Diagnosis not present

## 2020-06-10 DIAGNOSIS — Z09 Encounter for follow-up examination after completed treatment for conditions other than malignant neoplasm: Secondary | ICD-10-CM | POA: Diagnosis not present

## 2020-06-10 MED ORDER — SODIUM CHLORIDE 0.9 % IV SOLN: 500.0000 mL | Freq: Once | INTRAVENOUS | Status: DC

## 2020-06-10 NOTE — Patient Instructions (Signed)
Continue taking omeprazole twice daily. Avoid NSAIDs.   YOU HAD AN ENDOSCOPIC PROCEDURE TODAY AT [facility]:   Refer to the procedure report that was given to you for any specific questions about what was found during the examination.  If the procedure report does not answer your questions, please call your gastroenterologist to clarify.  If you requested that your care partner not be given the details of your procedure findings, then the procedure report has been included in a sealed envelope for you to review at your convenience later.  YOU SHOULD EXPECT: Some feelings of bloating in the abdomen. Passage of more gas than usual.  Walking can help get rid of the air that was put into your GI tract during the procedure and reduce the bloating. If you had a lower endoscopy (such as a colonoscopy or flexible sigmoidoscopy) you may notice spotting of blood in your stool or on the toilet paper. If you underwent a bowel prep for your procedure, you may not have a normal bowel movement for a few days.  Please Note:  You might notice some irritation and congestion in your nose or some drainage.  This is from the oxygen used during your procedure.  There is no need for concern and it should clear up in a day or so.  SYMPTOMS TO REPORT IMMEDIATELY:    Following upper endoscopy (EGD)  Vomiting of blood or coffee ground material  New chest pain or pain under the shoulder blades  Painful or persistently difficult swallowing  New shortness of breath  Fever of 100F or higher  Black, tarry-looking stools  For urgent or emergent issues, a gastroenterologist can be reached at any hour by calling [phone]. Do not use MyChart messaging for urgent concerns.    DIET:  We do recommend a small meal at first, but then you may proceed to your regular diet.  Drink plenty of fluids but you should avoid alcoholic beverages for 24 hours.  ACTIVITY:  You should plan to take it easy for the rest  of today and you should NOT DRIVE or use heavy machinery until tomorrow (because of the sedation medicines used during the test).    FOLLOW UP: Our staff will call the number listed on your records 48-72 hours following your procedure to check on you and address any questions or concerns that you may have regarding the information given to you following your procedure. If we do not reach you, we will leave a message.  We will attempt to reach you two times.  During this call, we will ask if you have developed any symptoms of COVID 19. If you develop any symptoms (ie: fever, flu-like symptoms, shortness of breath, cough etc.) before then, please call [phone].  If you test positive for Covid 19 in the 2 weeks post procedure, please call and report this information to [place].    If any biopsies were taken you will be contacted by phone or by letter within the next 1-3 weeks.  Please call us at [phone] if you have not heard about the biopsies in 3 weeks.    SIGNATURES/CONFIDENTIALITY: You and/or your care partner have signed paperwork which will be entered into your electronic medical record.  These signatures attest to the fact that that the information above on your After Visit Summary has been reviewed and is understood.  Full responsibility of the confidentiality of this discharge information lies with you and/or your care-partner.

## 2020-06-10 NOTE — Progress Notes (Signed)
Report given to PACU, vss 

## 2020-06-10 NOTE — Op Note (Signed)
Maxwell Fox Patient Name: Maxwell Fox Procedure Date: 06/10/2020 8:49 AM MRN: [id number] Endoscopist: Jerene Bears , MD Age: 75 Referring MD:  Date of Birth: 1945/07/23 Gender: Male Account #: [id number] Procedure:                Upper GI endoscopy Indications:              Follow-up of acute gastrojejunal ulcer with                            hemorrhage (last EGD in Feb 2022 with endoscopic                            treatment of UGI bleeding and removal of staples                            with surrounding inflammation). Medicines:                Monitored Anesthesia Care Procedure:                Pre-Anesthesia Assessment:                           - Prior to the procedure, a History and Physical                            was performed, and patient medications and                            allergies were reviewed. The patient's tolerance of                            previous anesthesia was also reviewed. The risks                            and benefits of the procedure and the sedation                            options and risks were discussed with the patient.                            All questions were answered, and informed consent                            was obtained. Prior Anticoagulants: The patient has                            taken no previous anticoagulant or antiplatelet                            agents. ASA Grade Assessment: II - A patient with                            mild systemic disease. After reviewing the risks  and benefits, the patient was deemed in                            satisfactory condition to undergo the procedure.                           After obtaining informed consent, the endoscope was                            passed under direct vision. Throughout the                            procedure, the patient's blood pressure, pulse, and                            oxygen saturations were monitored  continuously. The                            Endoscope was introduced through the mouth, and                            advanced to the efferent jejunal loop. The upper GI                            endoscopy was accomplished without difficulty. The                            patient tolerated the procedure well. Scope In: Scope Out: Findings:                 The examined esophagus was normal.                           Evidence of a Roux-en-Y gastrojejunostomy was                            found. The gastrojejunal anastomosis was                            characterized by healthy appearing mucosa, visible                            suture and the presence of no stomal/anastomotic                            ulceration. This was traversed. The                            pouch-to-jejunum limb was characterized by healthy                            appearing mucosa. The duodenum-to-jejunum limb was                            not examined as it could not be found. Complications:  No immediate complications. Estimated Blood Loss:     Estimated blood loss: none. Impression:               - Normal esophagus.                           - Roux-en-Y gastrojejunostomy with gastrojejunal                            anastomosis characterized by healthy appearing                            mucosa, visible sutures and no anastomotic                            ulceration.                           - No specimens collected. Recommendation:           - Patient has a contact number available for                            emergencies. The signs and symptoms of potential                            delayed complications were discussed with the                            patient. Return to normal activities tomorrow.                            Written discharge instructions were provided to the                            patient.                           - Resume previous diet.                            - Continue present medications. Continue twice                            daily omeprazole. Avoid NSAIDs.                           - Hgb has been improving and iron studies when                            checked recently by PCP are also improved.                           - Call my office if any evidence of black, tarry                            stools or blood in stools. Maxwell Fox  Maxwell Satterwhite, MD 06/10/2020 9:10:56 AM This report has been signed electronically.

## 2020-06-10 NOTE — Progress Notes (Signed)
Pt's states no medical or surgical changes since previsit or office visit. 

## 2020-06-10 NOTE — Progress Notes (Signed)
0850 Robinul 0.1 mg IV given due large amount of secretions upon assessment.  MD made aware, vss  

## 2020-06-11 DIAGNOSIS — M6281 Muscle weakness (generalized): Secondary | ICD-10-CM | POA: Diagnosis not present

## 2020-06-11 DIAGNOSIS — M25652 Stiffness of left hip, not elsewhere classified: Secondary | ICD-10-CM | POA: Diagnosis not present

## 2020-06-11 DIAGNOSIS — S32512D Fracture of superior rim of left pubis, subsequent encounter for fracture with routine healing: Secondary | ICD-10-CM | POA: Diagnosis not present

## 2020-06-12 ENCOUNTER — Other Ambulatory Visit: Payer: Self-pay | Admitting: *Deleted

## 2020-06-12 ENCOUNTER — Other Ambulatory Visit: Payer: Self-pay

## 2020-06-12 ENCOUNTER — Telehealth: Payer: Self-pay

## 2020-06-12 NOTE — Telephone Encounter (Signed)
Attempted to reach patient for post-procedure f/u call. No answer. Left message that staff will make another attempt to reach him again later today and for him to please not hesitate to call us if has any questions/concerns regarding his care.

## 2020-06-12 NOTE — Telephone Encounter (Signed)
Second follow up call attempt.  No answer L/M  

## 2020-06-12 NOTE — Patient Outreach (Signed)
[facility]) Care Management  [place]  06/12/2020   Maxwell Fox 12-25-1945 [id number]  Subjective: Telephone outreach  Encounter Medications:  Outpatient Encounter Medications as of 06/12/2020  Medication Sig  . acetaminophen (TYLENOL) 325 MG tablet Take 2 tablets (650 mg total) by mouth every 6 (six) hours as needed for moderate pain.  . calcium carbonate (OS-CAL - DOSED IN MG OF ELEMENTAL CALCIUM) 1250 (500 Ca) MG tablet Take 1 tablet by mouth daily with supper.  . Cholecalciferol (VITAMIN D) 2000 UNITS tablet Take 1 tablet (2,000 Units total) by mouth daily.  . clonazePAM (KLONOPIN) 1 MG tablet TAKE 1 TABLET(1 MG) BY MOUTH TWICE DAILY AS NEEDED FOR ANXIETY  . escitalopram (LEXAPRO) 10 MG tablet TAKE 1 TABLET(10 MG) BY MOUTH DAILY  . hydrocortisone (ANUSOL-HC) 25 MG suppository 1 suppository every evening for 7 days,1 suppository every other evening (Patient not taking: No sig reported)  . levothyroxine (SYNTHROID) 88 MCG tablet Take 1 tablet (88 mcg total) by mouth daily before breakfast.  . Multiple Vitamin (MULTIVITAMIN WITH MINERALS) TABS tablet Take 1 tablet by mouth daily.  Marland Kitchen omeprazole (PRILOSEC) 40 MG capsule Take 1 capsule (40 mg total) by mouth 2 (two) times daily.  . Oxycodone HCl 10 MG TABS Take 1 tablet (10 mg total) by mouth every 6 (six) hours as needed (chronic pain - pelvic fx).  . polyethylene glycol (MIRALAX / GLYCOLAX) 17 g packet Take 17 g by mouth daily as needed for mild constipation or moderate constipation.  . predniSONE (DELTASONE) 5 MG tablet Take 1 tablet (5 mg total) by mouth daily with breakfast.  . sucralfate (CARAFATE) 1 GM/10ML suspension Take 10 mLs (1 g total) by mouth 4 (four) times daily -  with meals and at bedtime.  . tamsulosin (FLOMAX) 0.4 MG CAPS capsule TAKE 1 CAPSULE(0.4 MG) BY MOUTH DAILY AFTER SUPPER   No facility-administered encounter medications on file as of 06/12/2020.    Functional Status:  In your  present state of health, do you have any difficulty performing the following activities: 03/27/2020 03/27/2020  Hearing? - N  Vision? - N  Difficulty concentrating or making decisions? - N  Walking or climbing stairs? - Y  Comment - -  Dressing or bathing? - Y  Doing errands, shopping? Y -  Some recent data might be hidden    Fall/Depression Screening: Fall Risk  02/09/2020 03/28/2018 01/10/2018  Falls in the past year? 0 0 0  Number falls in past yr: 0 - 0  Injury with Fall? 1 - -  Risk for fall due to : Impaired balance/gait - -  Follow up Falls evaluation completed - -   PHQ 2/9 Scores 02/09/2020 03/31/2018 03/28/2018 03/28/2018 01/10/2018 01/07/2017 11/01/2014  PHQ - 2 Score 0 1 0 0 5 0 2  PHQ- 9 Score - 3 0 - 12 - 4    Assessment:  Goals Addressed              This Visit's Progress     Patient Stated   .  Extend this goal for additional 2 months Starting 05/08/20: Will work with PT to regain mobility and be able to walk without assistance over the next 2 months. (pt-stated)   On track     03/13/20 Long term goal  Start date 03/13/20 Expected end date  03/13/20 Discussed pt personal goals. He hopes to be able to walk without his walker in 2 months. He will resume PT tomorrow. NP has encouraged  him to participate 100% in the therapy and to do the recommended exercises on other days. 04/03/20 Pt has had a set back, another hospitalization  (5 days) for another GI Bleed. The specialists are hopeful that they found the area of concern and have corrected the problem. He will restart his PT today. He feels the endoscopy positioning which was on the area of the fracture did not help in the healing process. He is very sore. He did talk with his provider about pain medication to help him get through the therapy and she is working with him on that. Encouraged him to give 100% during his PT sessions  and do the recommended exercises all other days. Continue safety practices to avoid falls. 04/10/20 Pt has not  had any health occurances for a week and has been able to get back to his PT. This is going well per his report. He still has pain but expects this will get better as he is able to move more freely. Encouraged exercises on days PT does not come. He has not had any falls. 05/08/20 Pt has completed his in home PT. He is able to walk with a cane in his home and uses a walker if he leaves his home for stability. He denies any falls. He will begin outpt PT sometime this month. We will extend this goal another 2 months so he can reach his initial goal of walking without assistance which he should be able to reach. Reminded of fall precautions. Discussed informing his outpt PT about his hx of injury and set backs, share his fear of being hurt and set back again. Encouraged him to tell PT when he is uncomfortable. 06/12/20 Making progress with PT and home mobility. Using a cane. Did walk from one chair to another in PT this week without assistance but he felt scared. He does his exercises at home. His stride has lengthened. Still requiring pain medication, especially important before and after PT. Suggested using an ice pack when he returns home to help with the pain so he can start weaning down from 4 oxycodone per day. He did have a scan at [facility] which revealed a secondary fx in his pelvis that was originally not recognized after his fall. There is question as to when this occurred. Reinforced safety/fall precautions and continued efforts in PT and home exercise.      Other   .  Will complete all annual wellness requirements that are due by the end of May.        Start date 03/13/20 Medium priority Expected end date: 07/02/20  03/13/20 Nurse reviewed gaps in care after conversation and will need to verify if he has completed his COVID vaccination series and had Tdap. All other wellness items have been verified as done. 04/03/20 Review of immunizations completed, pt states he has had all his vaccines but not listed in Epic.  Have requested that he provide dates of his COVID and Td vaccinations. 04/10/20 Pt provided dates for his COVID vaccine series, these have been added to the Epic EMR Needs Tdap and pt agrees to get this. 05/08/20 Maxwell Fox only has one care gap and that is to get his Tdap. Discussed the importance of getting all Medicare recommended preventive vaccines. Will send Dr. Quay Burow a message to remind her he needs this on his next appt. He is willing to do so. 06/12/20 Still has not received the vaccine, it keeps slipping his mind. States he could go today  to get it. Asks if I will contact his primary to arrange, which nurse has done. Will call in 2 weeks or pt to call me when this is completed.       Plan:  We agreed to talk in 2 weeks or when pt receives Td and then in one month. Follow-up:  Patient agrees to Care Plan and Follow-up.   Maxwell Pont. Myrtie Neither, MSN, [facility] Gerontological Nurse Practitioner [facility] Care Management [phone]

## 2020-06-14 DIAGNOSIS — M25652 Stiffness of left hip, not elsewhere classified: Secondary | ICD-10-CM | POA: Diagnosis not present

## 2020-06-14 DIAGNOSIS — M6281 Muscle weakness (generalized): Secondary | ICD-10-CM | POA: Diagnosis not present

## 2020-06-14 DIAGNOSIS — S32512D Fracture of superior rim of left pubis, subsequent encounter for fracture with routine healing: Secondary | ICD-10-CM | POA: Diagnosis not present

## 2020-06-17 ENCOUNTER — Other Ambulatory Visit: Payer: Self-pay | Admitting: Internal Medicine

## 2020-06-17 DIAGNOSIS — M6281 Muscle weakness (generalized): Secondary | ICD-10-CM | POA: Diagnosis not present

## 2020-06-17 DIAGNOSIS — S32512D Fracture of superior rim of left pubis, subsequent encounter for fracture with routine healing: Secondary | ICD-10-CM | POA: Diagnosis not present

## 2020-06-17 DIAGNOSIS — M25652 Stiffness of left hip, not elsewhere classified: Secondary | ICD-10-CM | POA: Diagnosis not present

## 2020-06-20 DIAGNOSIS — M25652 Stiffness of left hip, not elsewhere classified: Secondary | ICD-10-CM | POA: Diagnosis not present

## 2020-06-20 DIAGNOSIS — S32512D Fracture of superior rim of left pubis, subsequent encounter for fracture with routine healing: Secondary | ICD-10-CM | POA: Diagnosis not present

## 2020-06-20 DIAGNOSIS — M6281 Muscle weakness (generalized): Secondary | ICD-10-CM | POA: Diagnosis not present

## 2020-06-21 ENCOUNTER — Encounter: Payer: Self-pay | Admitting: Internal Medicine

## 2020-06-24 ENCOUNTER — Encounter: Payer: Self-pay | Admitting: Internal Medicine

## 2020-06-24 MED ORDER — OXYCODONE HCL 10 MG PO TABS: 10.0000 mg | ORAL_TABLET | Freq: Four times a day (QID) | ORAL | 0 refills | Status: DC | PRN

## 2020-06-25 ENCOUNTER — Other Ambulatory Visit: Payer: Self-pay | Admitting: Internal Medicine

## 2020-06-25 DIAGNOSIS — M6281 Muscle weakness (generalized): Secondary | ICD-10-CM | POA: Diagnosis not present

## 2020-06-25 DIAGNOSIS — S32512D Fracture of superior rim of left pubis, subsequent encounter for fracture with routine healing: Secondary | ICD-10-CM | POA: Diagnosis not present

## 2020-06-25 DIAGNOSIS — M25652 Stiffness of left hip, not elsewhere classified: Secondary | ICD-10-CM | POA: Diagnosis not present

## 2020-06-26 ENCOUNTER — Other Ambulatory Visit: Payer: Self-pay | Admitting: *Deleted

## 2020-06-26 ENCOUNTER — Other Ambulatory Visit: Payer: Self-pay

## 2020-06-27 ENCOUNTER — Telehealth: Payer: Self-pay | Admitting: Internal Medicine

## 2020-06-27 DIAGNOSIS — M25652 Stiffness of left hip, not elsewhere classified: Secondary | ICD-10-CM | POA: Diagnosis not present

## 2020-06-27 DIAGNOSIS — S32512D Fracture of superior rim of left pubis, subsequent encounter for fracture with routine healing: Secondary | ICD-10-CM | POA: Diagnosis not present

## 2020-06-27 DIAGNOSIS — M6281 Muscle weakness (generalized): Secondary | ICD-10-CM | POA: Diagnosis not present

## 2020-06-27 NOTE — Patient Outreach (Signed)
[facility]) Care Management  [place]  06/27/2020   Maxwell Fox [birth year] [id number]  Subjective: Telephone outreach to follow up on mobility s/p pelvic fx, and GI bleeding  Encounter Medications:  Outpatient Encounter Medications as of 06/26/2020  Medication Sig  . acetaminophen (TYLENOL) 325 MG tablet Take 2 tablets (650 mg total) by mouth every 6 (six) hours as needed for moderate pain.  . calcium carbonate (OS-CAL - DOSED IN MG OF ELEMENTAL CALCIUM) 1250 (500 Ca) MG tablet Take 1 tablet by mouth daily with supper.  . Cholecalciferol (VITAMIN D) 2000 UNITS tablet Take 1 tablet (2,000 Units total) by mouth daily.  . clonazePAM (KLONOPIN) 1 MG tablet TAKE 1 TABLET(1 MG) BY MOUTH TWICE DAILY AS NEEDED FOR ANXIETY  . escitalopram (LEXAPRO) 10 MG tablet TAKE 1 TABLET(10 MG) BY MOUTH DAILY  . levothyroxine (SYNTHROID) 88 MCG tablet TAKE 1 TABLET(88 MCG) BY MOUTH DAILY BEFORE BREAKFAST  . Multiple Vitamin (MULTIVITAMIN WITH MINERALS) TABS tablet Take 1 tablet by mouth daily.  Marland Kitchen omeprazole (PRILOSEC) 40 MG capsule Take 1 capsule (40 mg total) by mouth 2 (two) times daily.  . Oxycodone HCl 10 MG TABS Take 1 tablet (10 mg total) by mouth every 6 (six) hours as needed (chronic pain - pelvic fx).  . polyethylene glycol (MIRALAX / GLYCOLAX) 17 g packet Take 17 g by mouth daily as needed for mild constipation or moderate constipation.  . predniSONE (DELTASONE) 5 MG tablet Take 1 tablet (5 mg total) by mouth daily with breakfast.  . sucralfate (CARAFATE) 1 GM/10ML suspension Take 10 mLs (1 g total) by mouth 4 (four) times daily -  with meals and at bedtime.  . tamsulosin (FLOMAX) 0.4 MG CAPS capsule TAKE 1 CAPSULE(0.4 MG) BY MOUTH DAILY AFTER SUPPER   No facility-administered encounter medications on file as of 06/26/2020.    Functional Status:  In your present state of health, do you have any difficulty performing the following activities: 03/27/2020 03/27/2020   Hearing? - N  Vision? - N  Difficulty concentrating or making decisions? - N  Walking or climbing stairs? - Y  Comment - -  Dressing or bathing? - Y  Doing errands, shopping? Y -  Some recent data might be hidden    Fall/Depression Screening: Fall Risk  02/09/2020 03/28/2018 01/10/2018  Falls in the past year? 0 0 0  Number falls in past yr: 0 - 0  Injury with Fall? 1 - -  Risk for fall due to : Impaired balance/gait - -  Follow up Falls evaluation completed - -   PHQ 2/9 Scores 02/09/2020 03/31/2018 03/28/2018 03/28/2018 01/10/2018 01/07/2017 11/01/2014  PHQ - 2 Score 0 1 0 0 5 0 2  PHQ- 9 Score - 3 0 - 12 - 4    Assessment: s/p pelvic fx, mobility greatly improved                        GI Bleed resolved  Goals Addressed              This Visit's Progress     Patient Stated   .  Extend this goal for additional 2 months Starting 05/08/20: Will work with PT to regain mobility and be able to walk without assistance over the next 2 months. (pt-stated)   On track     03/13/20 Long term goal  Start date 03/13/20 Expected end date 08/01/20  Barriers: None  03/13/20 Discussed pt personal goals.  He hopes to be able to walk without his walker in 2 months. He will resume PT tomorrow. NP has encouraged him to participate 100% in the therapy and to do the recommended exercises on other days. 04/03/20 Pt has had a set back, another hospitalization  (5 days) for another GI Bleed. The specialists are hopeful that they found the area of concern and have corrected the problem. He will restart his PT today. He feels the endoscopy positioning which was on the area of the fracture did not help in the healing process. He is very sore. He did talk with his provider about pain medication to help him get through the therapy and she is working with him on that. Encouraged him to give 100% during his PT sessions  and do the recommended exercises all other days. Continue safety practices to avoid falls. 04/10/20 Pt has  not had any health occurances for a week and has been able to get back to his PT. This is going well per his report. He still has pain but expects this will get better as he is able to move more freely. Encouraged exercises on days PT does not come. He has not had any falls. 05/08/20 Pt has completed his in home PT. He is able to walk with a cane in his home and uses a walker if he leaves his home for stability. He denies any falls. He will begin outpt PT sometime this month. We will extend this goal another 2 months so he can reach his initial goal of walking without assistance which he should be able to reach. Reminded of fall precautions. Discussed informing his outpt PT about his hx of injury and set backs, share his fear of being hurt and set back again. Encouraged him to tell PT when he is uncomfortable. 06/12/20 Making progress with PT and home mobility. Using a cane. Did walk from one chair to another in PT this week without assistance but he felt scared. He does his exercises at home. His stride has lengthened. Still requiring pain medication, especially important before and after PT. Suggested using an ice pack when he returns home to help with the pain so he can start weaning down from 4 oxycodone per day. He did have a scan at [facility] which revealed a secondary fx in his pelvis that was originally not recognized after his fall. There is question as to when this occurred. Reinforced safety/fall precautions and continued efforts in PT and home exercise. 06/27/20 Pt is now able to ambulate with or without a cane. He continues to make progress with his PT. He is happy about this and feels that he will meet his goal. Reinforced safety precautions.      Other   .  COMPLETED: Will complete all annual wellness requirements that are due by the end of May.        Start date 03/13/20 Medium priority Expected end date: 07/02/20  03/13/20 Nurse reviewed gaps in care after conversation and will need to verify if he has  completed his COVID vaccination series and had Tdap. All other wellness items have been verified as done. 04/03/20 Review of immunizations completed, pt states he has had all his vaccines but not listed in Epic. Have requested that he provide dates of his COVID and Td vaccinations. 04/10/20 Pt provided dates for his COVID vaccine series, these have been added to the Epic EMR Needs Tdap and pt agrees to get this. 05/08/20 Mr. Heber [place] only has one  care gap and that is to get his Tdap. Discussed the importance of getting all Medicare recommended preventive vaccines. Will send Dr. Quay Burow a message to remind her he needs this on his next appt. He is willing to do so. 06/12/20 Still has not received the vaccine, it keeps slipping his mind. States he could go today to get it. Asks if I will contact his primary to arrange, which nurse has done. Will call in 2 weeks or pt to call me when this is completed. 06/27/20 Pt found out from primary that the Td vaccine is not covered by Medicare and therefore he will defer the cost of this vaccine at this time. He assures me if he would sustain an injury he would get it.        Plan: Advised pt's primary care office will have [place] and therefore will transition him when that RN is available. Otherwise we have agreed to talk again in one month.  Follow-up:  Patient agrees to Care Plan and Follow-up.   [name] Myrtie Neither, MSN, [name] Gerontological Nurse Practitioner [facility] Care Management [phone]

## 2020-06-27 NOTE — Progress Notes (Signed)
  Chronic Care Management   Outreach Note  06/27/2020 Name: Maxwell Fox MRN: [id number] DOB: October 04, 1945  Referred by: [name], MD Reason for referral : No chief complaint on file.   An unsuccessful telephone outreach was attempted today. The patient was referred to the pharmacist for assistance with care management and care coordination.   Follow Up Plan:   Lauretta Grill Upstream Scheduler

## 2020-07-04 DIAGNOSIS — M25652 Stiffness of left hip, not elsewhere classified: Secondary | ICD-10-CM | POA: Diagnosis not present

## 2020-07-04 DIAGNOSIS — S32512D Fracture of superior rim of left pubis, subsequent encounter for fracture with routine healing: Secondary | ICD-10-CM | POA: Diagnosis not present

## 2020-07-04 DIAGNOSIS — M6281 Muscle weakness (generalized): Secondary | ICD-10-CM | POA: Diagnosis not present

## 2020-07-08 ENCOUNTER — Other Ambulatory Visit: Payer: Self-pay | Admitting: Internal Medicine

## 2020-07-08 ENCOUNTER — Telehealth: Payer: Self-pay | Admitting: Internal Medicine

## 2020-07-08 DIAGNOSIS — S32512D Fracture of superior rim of left pubis, subsequent encounter for fracture with routine healing: Secondary | ICD-10-CM | POA: Diagnosis not present

## 2020-07-08 DIAGNOSIS — M25652 Stiffness of left hip, not elsewhere classified: Secondary | ICD-10-CM | POA: Diagnosis not present

## 2020-07-08 DIAGNOSIS — M6281 Muscle weakness (generalized): Secondary | ICD-10-CM | POA: Diagnosis not present

## 2020-07-08 NOTE — Progress Notes (Signed)
  Chronic Care Management   Outreach Note  07/08/2020 Name: Maxwell Fox MRN: [id number] DOB: May 10, 1945  Referred by: [name], MD Reason for referral : No chief complaint on file.   A second unsuccessful telephone outreach was attempted today. The patient was referred to pharmacist for assistance with care management and care coordination.  Follow Up Plan:   Lauretta Grill Upstream Scheduler

## 2020-07-15 DIAGNOSIS — M25652 Stiffness of left hip, not elsewhere classified: Secondary | ICD-10-CM | POA: Diagnosis not present

## 2020-07-15 DIAGNOSIS — M6281 Muscle weakness (generalized): Secondary | ICD-10-CM | POA: Diagnosis not present

## 2020-07-15 DIAGNOSIS — S32512D Fracture of superior rim of left pubis, subsequent encounter for fracture with routine healing: Secondary | ICD-10-CM | POA: Diagnosis not present

## 2020-07-17 ENCOUNTER — Encounter: Payer: Self-pay | Admitting: Internal Medicine

## 2020-07-17 DIAGNOSIS — M6281 Muscle weakness (generalized): Secondary | ICD-10-CM | POA: Diagnosis not present

## 2020-07-17 DIAGNOSIS — M25652 Stiffness of left hip, not elsewhere classified: Secondary | ICD-10-CM | POA: Diagnosis not present

## 2020-07-17 DIAGNOSIS — S32512D Fracture of superior rim of left pubis, subsequent encounter for fracture with routine healing: Secondary | ICD-10-CM | POA: Diagnosis not present

## 2020-07-19 NOTE — Telephone Encounter (Signed)
    Patient requesting response to message Advised Dr Quay Burow not in office

## 2020-07-20 ENCOUNTER — Other Ambulatory Visit: Payer: Self-pay | Admitting: Internal Medicine

## 2020-07-20 ENCOUNTER — Encounter: Payer: Self-pay | Admitting: Internal Medicine

## 2020-07-20 DIAGNOSIS — M545 Low back pain, unspecified: Secondary | ICD-10-CM

## 2020-07-20 DIAGNOSIS — G893 Neoplasm related pain (acute) (chronic): Secondary | ICD-10-CM

## 2020-07-20 DIAGNOSIS — S32512D Fracture of superior rim of left pubis, subsequent encounter for fracture with routine healing: Secondary | ICD-10-CM

## 2020-07-20 MED ORDER — OXYCODONE HCL 10 MG PO TABS: 10.0000 mg | ORAL_TABLET | Freq: Four times a day (QID) | ORAL | 0 refills | Status: DC | PRN

## 2020-07-22 ENCOUNTER — Other Ambulatory Visit: Payer: Self-pay

## 2020-07-22 ENCOUNTER — Encounter: Payer: Self-pay | Admitting: Internal Medicine

## 2020-07-22 NOTE — Progress Notes (Signed)
Subjective:    Patient ID: Maxwell Fox, male    DOB: 10/08/1945, 75 y.o.   MRN: [id number]  HPI The patient is here for follow up of his chronic pain from pelvic fractures.   He is currently taking oxycodone 10 mg Q 6 hrs.   He last filled his script 5/23.  He has not taking the medication in 8 days.  He ran out early - did take more than prescribed a couple of days due to his pain.  He has occasional pain in the pelvis.  He feels pain down both legs.  He denies lower back pain.    Since not taking the medication is feeling withdraw symptoms - Fatigue, weakness, chills and diarrhea.  His symptoms are getting worse.     He can walks with a cane.  He is doing PT 2-3 times a week at the gym.  He is getting stronger.  He can not do PT w/o the pain medication.    Medications and allergies reviewed with patient and updated if appropriate.  Patient Active Problem List   Diagnosis Date Noted   Gastric foreign body    Abnormal x-ray of small bowel    Acute gastric ulcer without hemorrhage or perforation    History of Roux-en-Y gastric bypass    Hemorrhagic shock (HCC)    Acute gastric ulcer with hemorrhage    Acute GI bleeding 02/14/2020   Acute blood loss anemia 02/14/2020   GI bleed 02/14/2020   Melena    Marginal ulcer    Gastroesophageal reflux disease with esophagitis without hemorrhage    Closed fracture of left superior pubic ramus ([place]) 02/09/2020   Anxiety and depression 01/10/2018   History of antineoplastic chemotherapy 07/19/2017   Chronic midline low back pain without sciatica 04/19/2017   Adrenal insufficiency due to cancer therapy ([place]) 07/27/2016   Secondary hypothyroidism 07/27/2016   H/O gastric bypass 01/06/2016   Pancreatic cyst 01/06/2016   Melanoma metastatic to brain ([place]) 12/31/2015   Iron deficiency anemia    B12 nutritional deficiency    Benign prostatic hyperplasia     Current Outpatient Medications on File Prior to Visit  Medication Sig  Dispense Refill   acetaminophen (TYLENOL) 325 MG tablet Take 2 tablets (650 mg total) by mouth every 6 (six) hours as needed for moderate pain.     calcium carbonate (OS-CAL - DOSED IN MG OF ELEMENTAL CALCIUM) 1250 (500 Ca) MG tablet Take 1 tablet by mouth daily with supper.     Cholecalciferol (VITAMIN D) 2000 UNITS tablet Take 1 tablet (2,000 Units total) by mouth daily. 30 tablet 11   clonazePAM (KLONOPIN) 1 MG tablet TAKE 1 TABLET(1 MG) BY MOUTH TWICE DAILY AS NEEDED FOR ANXIETY 60 tablet 2   escitalopram (LEXAPRO) 10 MG tablet TAKE 1 TABLET(10 MG) BY MOUTH DAILY 90 tablet 0   levothyroxine (SYNTHROID) 88 MCG tablet TAKE 1 TABLET(88 MCG) BY MOUTH DAILY BEFORE BREAKFAST 90 tablet 0   Multiple Vitamin (MULTIVITAMIN WITH MINERALS) TABS tablet Take 1 tablet by mouth daily.     omeprazole (PRILOSEC) 40 MG capsule Take 1 capsule (40 mg total) by mouth 2 (two) times daily. 60 capsule 6   polyethylene glycol (MIRALAX / GLYCOLAX) 17 g packet Take 17 g by mouth daily as needed for mild constipation or moderate constipation. 14 each 0   predniSONE (DELTASONE) 5 MG tablet TAKE 1 TABLET BY MOUTH EVERY DAY WITH BREAKFAST 90 tablet 0   sucralfate (CARAFATE) 1 GM/10ML  suspension Take 10 mLs (1 g total) by mouth 4 (four) times daily -  with meals and at bedtime. 1200 mL 3   tamsulosin (FLOMAX) 0.4 MG CAPS capsule TAKE 1 CAPSULE(0.4 MG) BY MOUTH DAILY AFTER SUPPER 90 capsule 1   No current facility-administered medications on file prior to visit.    Past Medical History:  Diagnosis Date   B12 nutritional deficiency    malabsorbtion   Blood transfusion without reported diagnosis 2022   BPH (benign prostatic hypertrophy)    Brain cancer ([place]) 2017   removed "golf ball sized mass"   Depression    on meds   Diverticulosis    GERD (gastroesophageal reflux disease)    on meds   GI bleeding 02/2020   Hypertension    Iron deficiency anemia    malabsorption related (s/p gastric bypass)   Melanoma (HCC)     Obesity    s/p gastric bypass 2000, start 355#   Pancreatic cyst    Pulmonary nodules    Thyroid disease    on meds    Past Surgical History:  Procedure Laterality Date   CHOLECYSTECTOMY  2000   COLONOSCOPY WITH PROPOFOL N/A 03/05/2020   Procedure: COLONOSCOPY WITH PROPOFOL;  Surgeon: Irving Copas., MD;  Location: [facility] ENDOSCOPY;  Service: Gastroenterology;  Laterality: N/A;   CRANIOTOMY FOR TUMOR  12/11/2015   metastatic melanoma   ENTEROSCOPY N/A 03/30/2020   Procedure: ENTEROSCOPY;  Surgeon: Irene Shipper, MD;  Location: [facility] ENDOSCOPY;  Service: Endoscopy;  Laterality: N/A;   ESOPHAGOGASTRODUODENOSCOPY N/A 03/30/2020   Procedure: ESOPHAGOGASTRODUODENOSCOPY (EGD);  Surgeon: Irene Shipper, MD;  Location: [facility] ENDOSCOPY;  Service: Endoscopy;  Laterality: N/A;   ESOPHAGOGASTRODUODENOSCOPY (EGD) WITH PROPOFOL N/A 02/14/2020   Procedure: ESOPHAGOGASTRODUODENOSCOPY (EGD) WITH PROPOFOL;  Surgeon: Lavena Bullion, DO;  Location: [facility] ENDOSCOPY;  Service: Endoscopy;  Laterality: N/A;   ESOPHAGOGASTRODUODENOSCOPY (EGD) WITH PROPOFOL N/A 02/26/2020   Procedure: ESOPHAGOGASTRODUODENOSCOPY (EGD) WITH PROPOFOL;  Surgeon: Irene Shipper, MD;  Location: [facility] ENDOSCOPY;  Service: Endoscopy;  Laterality: N/A;   ESOPHAGOGASTRODUODENOSCOPY (EGD) WITH PROPOFOL N/A 03/03/2020   Procedure: ESOPHAGOGASTRODUODENOSCOPY (EGD) WITH PROPOFOL;  Surgeon: Carol Ada, MD;  Location: [place];  Service: Endoscopy;  Laterality: N/A;   ESOPHAGOGASTRODUODENOSCOPY (EGD) WITH PROPOFOL N/A 03/28/2020   Procedure: ESOPHAGOGASTRODUODENOSCOPY (EGD) WITH PROPOFOL;  Surgeon: Irene Shipper, MD;  Location: [facility] ENDOSCOPY;  Service: Endoscopy;  Laterality: N/A;   FOREIGN BODY REMOVAL  03/30/2020   Procedure: FOREIGN BODY REMOVAL;  Surgeon: Irene Shipper, MD;  Location: [facility] ENDOSCOPY;  Service: Endoscopy;;   GIVENS CAPSULE STUDY N/A 03/28/2020   Procedure: GIVENS CAPSULE STUDY;  Surgeon: Irene Shipper, MD;  Location: [facility] ENDOSCOPY;   Service: Endoscopy;  Laterality: N/A;   HEMOSTASIS CONTROL  02/26/2020   Procedure: HEMOSTASIS CONTROL;  Surgeon: Irene Shipper, MD;  Location: [facility] ENDOSCOPY;  Service: Endoscopy;;   HEMOSTASIS CONTROL  03/30/2020   Procedure: HEMOSTASIS CONTROL;  Surgeon: Irene Shipper, MD;  Location: [facility] ENDOSCOPY;  Service: Endoscopy;;   HOT HEMOSTASIS N/A 02/26/2020   Procedure: HOT HEMOSTASIS (ARGON PLASMA COAGULATION/BICAP);  Surgeon: Irene Shipper, MD;  Location: [facility] ENDOSCOPY;  Service: Endoscopy;  Laterality: N/A;   MOHS SURGERY  2012   melanoma, mid back, [place]   TOTAL SHOULDER REPLACEMENT Right 2011   [place]   WISDOM TOOTH EXTRACTION      Social History   Socioeconomic History   Marital status: Married    Spouse name: Not  on file   Number of children: 2   Years of education: Not on file   Highest education level: Not on file  Occupational History   Occupation: general contractor-retired  Tobacco Use   Smoking status: Former    Pack years: 0.00    Types: Cigarettes    Quit date: 02/03/1963    Years since quitting: 57.5   Smokeless tobacco: Never  Vaping Use   Vaping Use: Never used  Substance and Sexual Activity   Alcohol use: Not Currently    Comment: wine 2x/week   Drug use: Never   Sexual activity: Not on file  Other Topics Concern   Not on file  Social History Narrative   Lives with wife   Retired - Conservator, 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   Social Determinants of Radio broadcast assistant Strain: Not on [employer] Insecurity: No Food Insecurity   Worried About Charity fundraiser in the Last Year: Never true   Arboriculturist in the Last Year: Never true  Transportation Needs: No Transportation Needs   Lack of Transportation (Medical): No   Lack of Transportation (Non-Medical): No  Physical Activity: Not on file  Stress: Not on file  Social Connections: Not on file    Family History  Problem Relation Age of Onset    Hypertension Father    Macular degeneration Mother    Melanoma Mother    Melanoma Sister    Stomach cancer Sister 32       duodenal   Breast cancer Sister    Uterine cancer Sister    Melanoma Brother    Colon polyps Neg Hx    Colon cancer Neg Hx    Esophageal cancer Neg Hx    Rectal cancer Neg Hx     Review of Systems  Constitutional:  Positive for chills and fatigue.  Respiratory:  Negative for shortness of breath.   Cardiovascular:  Negative for chest pain and palpitations.      Objective:   Vitals:   07/23/20 1439  BP: 122/80  Pulse: 68  SpO2: 92%   BP Readings from Last 3 Encounters:  07/23/20 122/80  06/10/20 (!) 145/82  04/08/20 110/70   Wt Readings from Last 3 Encounters:  07/23/20 185 lb (83.9 kg)  06/10/20 190 lb (86.2 kg)  06/05/20 190 lb (86.2 kg)   Body mass index is 25.8 kg/m.   Physical Exam    Constitutional: Appears well-developed and well-nourished. No distress.  Skin: Skin is warm and dry. Not diaphoretic.  Psychiatric: Normal mood and affect. Behavior is normal.      Assessment & Plan:    See Problem List for Assessment and Plan of chronic medical problems.    This visit occurred during the SARS-CoV-2 public health emergency.  Safety protocols were in place, including screening questions prior to the visit, additional usage of staff PPE, and extensive cleaning of exam room while observing appropriate contact time as indicated for disinfecting solutions.

## 2020-07-23 ENCOUNTER — Ambulatory Visit (INDEPENDENT_AMBULATORY_CARE_PROVIDER_SITE_OTHER): Payer: Medicare Other | Admitting: Internal Medicine

## 2020-07-23 ENCOUNTER — Encounter: Payer: Self-pay | Admitting: Internal Medicine

## 2020-07-23 VITALS — BP 122/80 | HR 68 | Ht 71.0 in | Wt 185.0 lb

## 2020-07-23 DIAGNOSIS — G893 Neoplasm related pain (acute) (chronic): Secondary | ICD-10-CM

## 2020-07-23 DIAGNOSIS — S32512D Fracture of superior rim of left pubis, subsequent encounter for fracture with routine healing: Secondary | ICD-10-CM | POA: Diagnosis not present

## 2020-07-23 MED ORDER — OXYCODONE HCL 10 MG PO TABS: 10.0000 mg | ORAL_TABLET | Freq: Four times a day (QID) | ORAL | 0 refills | Status: DC | PRN

## 2020-07-23 NOTE — Patient Instructions (Addendum)
  Blood work was ordered.     Medications changes include :     Your prescription(s) have been submitted to your pharmacy. Please take as directed and contact our office if you believe you are having problem(s) with the medication(s).   A referral was ordered for        Someone from their office will call you to schedule an appointment.    Please followup in 1 month

## 2020-07-24 NOTE — Assessment & Plan Note (Signed)
Chronic At this point his pelvic fracture has healed He has been having pain from the fracture and into the legs His pain has made physical therapy very difficult, which is why he has been on the medication In the past month he did take more medication than prescribed-I discussed with him that he can take more than prescribed At this point he is due for a refill and they will refill his oxycodone for 10 mg every 6 hours for the next month He will follow-up in 1 month and we will start to slowly wean him off the medication Symptoms he is experiencing now are withdrawal symptoms and will resolve once he restarts medication

## 2020-07-29 ENCOUNTER — Other Ambulatory Visit: Payer: Self-pay | Admitting: *Deleted

## 2020-07-31 ENCOUNTER — Telehealth: Payer: Self-pay | Admitting: Internal Medicine

## 2020-07-31 NOTE — Chronic Care Management (AMB) (Signed)
  Chronic Care Management   Outreach Note  07/31/2020 Name: Maxwell Fox MRN: [id number] DOB: 02/14/1945  Referred by: [name], MD Reason for referral : No chief complaint on file.   Third unsuccessful telephone outreach was attempted today. The patient was referred to the pharmacist for assistance with care management and care coordination.   Follow Up Plan:  Lauretta Grill Upstream Scheduler

## 2020-08-02 DIAGNOSIS — M6281 Muscle weakness (generalized): Secondary | ICD-10-CM | POA: Diagnosis not present

## 2020-08-02 DIAGNOSIS — M25652 Stiffness of left hip, not elsewhere classified: Secondary | ICD-10-CM | POA: Diagnosis not present

## 2020-08-02 DIAGNOSIS — S32512D Fracture of superior rim of left pubis, subsequent encounter for fracture with routine healing: Secondary | ICD-10-CM | POA: Diagnosis not present

## 2020-08-08 ENCOUNTER — Telehealth: Payer: Self-pay | Admitting: Internal Medicine

## 2020-08-08 NOTE — Progress Notes (Signed)
  Chronic Care Management   Outreach Note  08/08/2020 Name: Maxwell Fox MRN: [id number] DOB: [birth year]  Referred by: [name], MD Reason for referral : No chief complaint on file.   Third unsuccessful telephone outreach was attempted today. The patient was referred to the pharmacist for assistance with care management and care coordination.   Follow Up Plan:   Lauretta Grill Upstream Scheduler

## 2020-08-09 DIAGNOSIS — M25652 Stiffness of left hip, not elsewhere classified: Secondary | ICD-10-CM | POA: Diagnosis not present

## 2020-08-09 DIAGNOSIS — S32512D Fracture of superior rim of left pubis, subsequent encounter for fracture with routine healing: Secondary | ICD-10-CM | POA: Diagnosis not present

## 2020-08-09 DIAGNOSIS — M6281 Muscle weakness (generalized): Secondary | ICD-10-CM | POA: Diagnosis not present

## 2020-08-12 ENCOUNTER — Other Ambulatory Visit: Payer: Self-pay | Admitting: Internal Medicine

## 2020-08-13 ENCOUNTER — Telehealth: Payer: Self-pay | Admitting: Internal Medicine

## 2020-08-13 DIAGNOSIS — S32512D Fracture of superior rim of left pubis, subsequent encounter for fracture with routine healing: Secondary | ICD-10-CM

## 2020-08-13 DIAGNOSIS — G893 Neoplasm related pain (acute) (chronic): Secondary | ICD-10-CM

## 2020-08-13 MED ORDER — OXYCODONE HCL 10 MG PO TABS: 10.0000 mg | ORAL_TABLET | Freq: Three times a day (TID) | ORAL | 0 refills | Status: DC | PRN

## 2020-08-13 NOTE — Telephone Encounter (Signed)
Patient calling to report he has no Oxycodone remaining. He states he can not wait until 7/21 renewal of medication

## 2020-08-13 NOTE — Telephone Encounter (Signed)
Already sent - advised pharmacy to not fill prior to the 19th

## 2020-08-13 NOTE — Telephone Encounter (Signed)
I advised him at his visit he cannot take more than prescribed.  He is  a week early.  I cannot refill this medication early and he is not due until 7/19.  I will send a refill in that can only be filled on 7/19 and we are decreasing the dose to 3 pills a day.  He needs to get off this medication-we will taper slowly, but starting 7/19 he will only be allowed to take 3 a day.

## 2020-08-13 NOTE — Telephone Encounter (Signed)
   Patient called and is requesting a call back in regards to Oxycodone HCl 10 MG TABS

## 2020-08-15 DIAGNOSIS — M6281 Muscle weakness (generalized): Secondary | ICD-10-CM | POA: Diagnosis not present

## 2020-08-15 DIAGNOSIS — S32512D Fracture of superior rim of left pubis, subsequent encounter for fracture with routine healing: Secondary | ICD-10-CM | POA: Diagnosis not present

## 2020-08-15 DIAGNOSIS — M25652 Stiffness of left hip, not elsewhere classified: Secondary | ICD-10-CM | POA: Diagnosis not present

## 2020-08-19 DIAGNOSIS — S32512D Fracture of superior rim of left pubis, subsequent encounter for fracture with routine healing: Secondary | ICD-10-CM | POA: Diagnosis not present

## 2020-08-19 DIAGNOSIS — M6281 Muscle weakness (generalized): Secondary | ICD-10-CM | POA: Diagnosis not present

## 2020-08-19 DIAGNOSIS — M25652 Stiffness of left hip, not elsewhere classified: Secondary | ICD-10-CM | POA: Diagnosis not present

## 2020-08-20 ENCOUNTER — Encounter: Payer: Self-pay | Admitting: Internal Medicine

## 2020-08-20 ENCOUNTER — Other Ambulatory Visit: Payer: Self-pay

## 2020-08-20 ENCOUNTER — Ambulatory Visit (INDEPENDENT_AMBULATORY_CARE_PROVIDER_SITE_OTHER): Payer: Medicare Other | Admitting: Internal Medicine

## 2020-08-20 VITALS — BP 110/78 | HR 69 | Ht 71.0 in | Wt 180.8 lb

## 2020-08-20 DIAGNOSIS — M810 Age-related osteoporosis without current pathological fracture: Secondary | ICD-10-CM

## 2020-08-20 DIAGNOSIS — E273 Drug-induced adrenocortical insufficiency: Secondary | ICD-10-CM

## 2020-08-20 DIAGNOSIS — M816 Localized osteoporosis [Lequesne]: Secondary | ICD-10-CM | POA: Diagnosis not present

## 2020-08-20 DIAGNOSIS — M8000XS Age-related osteoporosis with current pathological fracture, unspecified site, sequela: Secondary | ICD-10-CM

## 2020-08-20 DIAGNOSIS — S32512S Fracture of superior rim of left pubis, sequela: Secondary | ICD-10-CM

## 2020-08-20 DIAGNOSIS — E038 Other specified hypothyroidism: Secondary | ICD-10-CM

## 2020-08-20 NOTE — Progress Notes (Addendum)
Patient ID: Maxwell Fox, male   DOB: 05-Feb-1945, 75 y.o.   MRN: [id number]   This visit occurred during the SARS-CoV-2 public health emergency.  Safety protocols were in place, including screening questions prior to the visit, additional usage of staff PPE, and extensive cleaning of exam room while observing appropriate contact time as indicated for disinfecting solutions.   HPI  Maxwell Fox is a 75 y.o.-year-old male, referred by his PCP, Dr. Drema Dallas, for management of central adrenal insufficiency and central hypothyroidism.  Last visit 1 year ago. Prev. Saw Dr. Dara Lords at [facility].  Interim history: In 01/2020, he had a closed fracture of the superior is ramus of left pubis after falling in his bathroom. In PT now 3x a week. He also had 4 visits to the hospital with GIB 2/2 staples still left at the site of his previous gastric bypass surgery. He had a colonoscopy and 4 EGDs. He was weak and fell again several times due to persistent anemia.  He actually had a pelvic refracture while in house when they were turning him. He has nausea, hip pain, no CP, no bleeding.  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, brain (pembrolizumab, ipilimumab)  He is on levothyroxine 88 mcg daily: - in am - fasting - at least 60 min from b'fast - no Fe, PPIs - + MVI and calcium at 4 PM  - not on Biotin  Reviewed patient's TFTs: Lab Results  Component Value Date   TSH 0.234 (L) 03/04/2020   TSH 0.53 08/18/2019   TSH 0.53 02/17/2019   TSH 0.550 08/16/2018   TSH 2.39 11/01/2014   FREET4 0.96 08/18/2019   FREET4 1.36 02/17/2019   T3FREE 2.3 02/17/2019   Reviewed records from [facility] (Care Everywhere):   Antithyroid antibodies: No results found for: THGAB No components found for: TPOAB  At last visit he had intentional weight loss of 16 pounds in the previous 6 months after eliminating sweets and bread.  However,  he gained 8 pounds back since then.  Pt denies: - feeling nodules in neck - hoarseness - dysphagia - choking - SOB with lying down  She has no FH of thyroid disorders. No FH of thyroid cancer. No h/o radiation tx to head or neck.  No herbal supplements. No Biotin use.   Central adrenal insufficiency:  His adrenal insufficiency was diagnosed in 2018, secondary to metastatic melanoma treatment.    21-hydroxylase antibodies were not elevated:   He is on prednisone 5 mg daily in the morning. He tried 3 and 6 mg >> he feels best on 5 mg.  Of note, he has a history of R en Y gastric bypass in 2000 (initial weight : 350 lbs!),  HTN.  On vitamin D 1000 units daily. Prev. Levels normal - in 2018.  He also had a TIA in 2018, but this appears to have resolved.  Sodium levels were reviewed and these were normal:  Lab Results  Component Value Date   NA 141 04/01/2020   NA 143 03/29/2020   NA 138 03/28/2020   NA 141 03/27/2020   NA 141 03/04/2020   NA 139 03/03/2020   NA 137 02/27/2020   NA 141 02/26/2020   NA 140 02/26/2020   NA 141 02/20/2020   He has nocturia 1-2x a night -he takes Flomax.  He moved from [place] to [place] to be near her daughter and her 3 children.  Daughter is going through a difficult divorce.  ROS:+ see HPI + weight loss, +  fatigue, no subjective hyperthermia, no subjective hypothermia + vitiligo hands and arms after immune treatment for his melanoma, also + easy bruising  I reviewed pt's medications, allergies, PMH, social hx, family hx, and changes were documented in the history of present illness. Otherwise, unchanged from my initial visit note.  Past Medical History:  Diagnosis Date   B12 nutritional deficiency    malabsorbtion   Blood transfusion without reported diagnosis 2022   BPH (benign prostatic hypertrophy)    Brain cancer ([place]) 2017   removed "golf ball sized mass"   Depression    on meds   Diverticulosis    GERD  (gastroesophageal reflux disease)    on meds   GI bleeding 02/2020   Hypertension    Iron deficiency anemia    malabsorption related (s/p gastric bypass)   Melanoma (H[facility])    Obesity    s/p gastric bypass 2000, start 355#   Pancreatic cyst    Pulmonary nodules    Thyroid disease    on meds   Past Surgical History:  Procedure Laterality Date   CHOLECYSTECTOMY  2000   COLONOSCOPY WITH PROPOFOL N/A 03/05/2020   Procedure: COLONOSCOPY WITH PROPOFOL;  Surgeon: Irving Copas., MD;  Location: [facility] ENDOSCOPY;  Service: Gastroenterology;  Laterality: N/A;   CRANIOTOMY FOR TUMOR  12/11/2015   metastatic melanoma   ENTEROSCOPY N/A 03/30/2020   Procedure: ENTEROSCOPY;  Surgeon: Irene Shipper, MD;  Location: [facility] ENDOSCOPY;  Service: Endoscopy;  Laterality: N/A;   ESOPHAGOGASTRODUODENOSCOPY N/A 03/30/2020   Procedure: ESOPHAGOGASTRODUODENOSCOPY (EGD);  Surgeon: Irene Shipper, MD;  Location: [facility] ENDOSCOPY;  Service: Endoscopy;  Laterality: N/A;   ESOPHAGOGASTRODUODENOSCOPY (EGD) WITH PROPOFOL N/A 02/14/2020   Procedure: ESOPHAGOGASTRODUODENOSCOPY (EGD) WITH PROPOFOL;  Surgeon: Lavena Bullion, DO;  Location: [facility] ENDOSCOPY;  Service: Endoscopy;  Laterality: N/A;   ESOPHAGOGASTRODUODENOSCOPY (EGD) WITH PROPOFOL N/A 02/26/2020   Procedure: ESOPHAGOGASTRODUODENOSCOPY (EGD) WITH PROPOFOL;  Surgeon: Irene Shipper, MD;  Location: [facility] ENDOSCOPY;  Service: Endoscopy;  Laterality: N/A;   ESOPHAGOGASTRODUODENOSCOPY (EGD) WITH PROPOFOL N/A 03/03/2020   Procedure: ESOPHAGOGASTRODUODENOSCOPY (EGD) WITH PROPOFOL;  Surgeon: Carol Ada, MD;  Location: [facility];  Service: Endoscopy;  Laterality: N/A;   ESOPHAGOGASTRODUODENOSCOPY (EGD) WITH PROPOFOL N/A 03/28/2020   Procedure: ESOPHAGOGASTRODUODENOSCOPY (EGD) WITH PROPOFOL;  Surgeon: Irene Shipper, MD;  Location: [facility] ENDOSCOPY;  Service: Endoscopy;  Laterality: N/A;   FOREIGN BODY REMOVAL  03/30/2020   Procedure: FOREIGN BODY REMOVAL;  Surgeon: Irene Shipper, MD;   Location: [facility] ENDOSCOPY;  Service: Endoscopy;;   GIVENS CAPSULE STUDY N/A 03/28/2020   Procedure: GIVENS CAPSULE STUDY;  Surgeon: Irene Shipper, MD;  Location: [facility] ENDOSCOPY;  Service: Endoscopy;  Laterality: N/A;   HEMOSTASIS CONTROL  02/26/2020   Procedure: HEMOSTASIS CONTROL;  Surgeon: Irene Shipper, MD;  Location[facility] ENDOSCOPY;  Service: Endoscopy;;   HEMOSTASIS CONTROL  03/30/2020   Procedure: HEMOSTASIS CONTROL;  Surgeon: Irene Shipper, MD;  Location: [facility] ENDOSCOPY;  Service: Endoscopy;;   HOT HEMOSTASIS N/A 02/26/2020   Procedure: HOT HEMOSTASIS (ARGON PLASMA COAGULATION/BICAP);  Surgeon: Irene Shipper, MD;  Location: [facility] ENDOSCOPY;  Service: Endoscopy;  Laterality: N/A;   MOHS SURGERY  2012   melanoma, mid back, [place]   TOTAL SHOULDER REPLACEMENT Right 2011   [place]   WISDOM TOOTH EXTRACTION     Social History   Socioeconomic History   Marital  status: Married    Spouse name: Not on file   Number of children: 2   Years of education: Not on file   Highest education level: Not on file  Occupational History   retired  Tobacco Use   Smoking status: Former Smoker    Quit date: 02/03/1963    Years since quitting: 56.0   Smokeless tobacco: Never Used  Substance and Sexual Activity   Alcohol use: Not on file    Comment: wine 2x/week   Drug use: Not on file   Sexual activity: Not on file  Other Topics Concern   Not on file  Social History Narrative   Lives with wife   Retired - Conservator, 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 [employer]   Social Determinants of Radio broadcast assistant Strain:    Difficulty of Paying Living Expenses: Not on [employer] Insecurity:    Worried About Charity fundraiser in the Last Year: Not on file   [place] in the Last Year: Not on file  Transportation Needs:    Film/video editor (Medical): Not on file   Lack of Transportation (Non-Medical): Not on file  Physical Activity:    Days of Exercise  per Week: Not on file   Minutes of Exercise per Session: Not on file  Stress:    Feeling of Stress : Not on file  Social Connections:    Frequency of Communication with Friends and Family: Not on file   Frequency of Social Gatherings with Friends and Family: Not on file   Attends Religious Services: Not on file   Active Member of Clubs or Organizations: Not on file   Attends Archivist Meetings: Not on file   Marital Status: Not on file  Intimate Partner Violence:    Fear of Current or Ex-Partner: Not on file   Emotionally Abused: Not on file   Physically Abused: Not on file   Sexually Abused: Not on file   Current Outpatient Medications on File Prior to Visit  Medication Sig Dispense Refill   acetaminophen (TYLENOL) 325 MG tablet Take 2 tablets (650 mg total) by mouth every 6 (six) hours as needed for moderate pain.     calcium carbonate (OS-CAL - DOSED IN MG OF ELEMENTAL CALCIUM) 1250 (500 Ca) MG tablet Take 1 tablet by mouth daily with supper.     Cholecalciferol (VITAMIN D) 2000 UNITS tablet Take 1 tablet (2,000 Units total) by mouth daily. 30 tablet 11   clonazePAM (KLONOPIN) 1 MG tablet TAKE 1 TABLET(1 MG) BY MOUTH TWICE DAILY AS NEEDED FOR ANXIETY 60 tablet 2   escitalopram (LEXAPRO) 10 MG tablet TAKE 1 TABLET(10 MG) BY MOUTH DAILY 90 tablet 0   levothyroxine (SYNTHROID) 88 MCG tablet TAKE 1 TABLET(88 MCG) BY MOUTH DAILY BEFORE BREAKFAST 90 tablet 0   Multiple Vitamin (MULTIVITAMIN WITH MINERALS) TABS tablet Take 1 tablet by mouth daily.     omeprazole (PRILOSEC) 40 MG capsule Take 1 capsule (40 mg total) by mouth 2 (two) times daily. 60 capsule 6   Oxycodone HCl 10 MG TABS Take 1 tablet (10 mg total) by mouth every 8 (eight) hours as needed (chronic pain - pelvic fx). 90 tablet 0   polyethylene glycol (MIRALAX / GLYCOLAX) 17 g packet Take 17 g by mouth daily as needed for mild constipation or moderate constipation. 14 each 0   predniSONE (DELTASONE) 5 MG tablet TAKE 1  TABLET BY MOUTH EVERY  DAY WITH BREAKFAST 90 tablet 0   sucralfate (CARAFATE) 1 GM/10ML suspension Take 10 mLs (1 g total) by mouth 4 (four) times daily -  with meals and at bedtime. 1200 mL 3   tamsulosin (FLOMAX) 0.4 MG CAPS capsule TAKE 1 CAPSULE(0.4 MG) BY MOUTH DAILY AFTER SUPPER 90 capsule 1   No current facility-administered medications on file prior to visit.   No Known Allergies  Family History  Problem Relation Age of Onset   Hypertension Father    Macular degeneration Mother    Melanoma Mother    Melanoma Sister    Stomach cancer Sister 88       duodenal   Breast cancer Sister    Uterine cancer Sister    Melanoma Brother    Colon polyps Neg Hx    Colon cancer Neg Hx    Esophageal cancer Neg Hx    Rectal cancer Neg Hx    PE: BP 110/78 (BP Location: Right Arm, Patient Position: Sitting, Cuff Size: Normal)   Pulse 69   Ht 5\' 11"  (1.803 m)   Wt 180 lb 12.8 oz (82 kg)   SpO2 98%   BMI 25.22 kg/m  Wt Readings from Last 3 Encounters:  08/20/20 180 lb 12.8 oz (82 kg)  07/23/20 185 lb (83.9 kg)  06/10/20 190 lb (86.2 kg)   Constitutional: normal weight in NAD, walks with a cane Eyes: PERRLA, EOMI, no exophthalmos ENT: moist mucous membranes, no thyromegaly, no cervical lymphadenopathy Cardiovascular: RRR, No MRG Respiratory: CTA B Gastrointestinal: abdomen soft, NT, ND, BS+ Musculoskeletal: no deformities, strength intact in all 4 Skin: moist, warm, no rashes Neurological: no tremor with outstretched hands, DTR normal in all 4  ASSESSMENT: 1. Central Hypothyroidism  2. Central adrenal insufficiency  3. Ramus pubis fracture  PLAN:  1. Patient with  central hypothyroidism on levothyroxine treatment. -Possibly related to ipilimumab, but also possibly to pembrolizumab (although this most likely causes thyroiditis) - latest thyroid labs reviewed with pt. >> TSH is suppressed, as expected in central hypothyroidism, but free T4 was normal - he continues on LT4 88  mcg daily - pt feels good on this dose. - we discussed about taking the thyroid hormone every day, with water, >30 minutes before breakfast, separated by >4 hours from acid reflux medications, calcium, iron, multivitamins. Pt. is taking it correctly. - will check thyroid tests tomorrow, since he already took his prednisone and levothyroxine dose today: fT4, and  a free T3 - If labs are abnormal, he will need to return for repeat TFTs in 1.5 months - Otherwise, I will see him back in 6 months  2.  Central adrenal insufficiency -He continues on 5 mg of prednisone daily -No adrenal insufficiency symptoms, but lost 10 lbs in the last 2 months and 16 lbs since last OV -We again reviewed sick day rules.  Discussed about increasing the dose of prednisone (2x) in case of fever and obtaining the medication parenterally if he cannot keep tablets down.   -He does have a steroid solution at home to inject if needed in case of an emergency  3. Ramus pubis fracture -This is a fragility fracture since he fell from level ground. -In the setting of his metastatic disease, I suggested that he discuss with oncology whether Del[facility] See would be a good option for him -I did suggest to check a DXA scan, since he did not have one before -We will also check a new vitamin D level-previous levels were normal but  last was checked in 2018.  Patient Instructions  Please continue Levothyroxine 88 mcg daily.  Take the thyroid hormone every day, with water, at least 30 minutes before breakfast, separated by at least 4 hours from: - acid reflux medications - calcium - iron - multivitamins  Continue prednisone 5 mg daily.  Please remember: - You absolutely need to take this medication every day and not skip doses. - Please double the dose if you have a fever, for the duration of the fever. - If you cannot take anything by mouth (vomiting) or you have severe diarrhea so that you eliminate the Prednisone pills in your stool,  please make sure that you get the Prednisone in the vein instead - go to the nearest emergency department/urgent care or you may go to your PCPs office   Please come back for labs in am without taking the prednisone and Levothyroxine.  Check with your Oncologist whether you can start on Xgeva.  Please come back for a follow-up appointment in 6 months.  Orders Placed This Encounter  Procedures   DG Bone Density   T3, free   T4, free   VITAMIN D 25 Hydroxy (Vit-D Deficiency, Fractures)   Component     Latest Ref Rng & Units 08/21/2020  VITD     30.00 - 100.00 ng/mL 66.31  T4,Free(Direct)     0.60 - 1.60 ng/dL 1.01  Triiodothyronine,Free,Serum     2.3 - 4.2 pg/mL 3.8  All labs are excellent.  Results: 08/22/2020 ([place]) Lumbar spine L1-L4 (L2) Femoral neck (FN) 33% distal radius  T-score -0.8 RFN: -2.6 LFN: -2.2 n/a  Right femoral neck T score shows osteoporosis.  I advised the patient to discuss with his oncologist whether he would qualify for Xgeva or Zometa.  [name], MD PhD [facility] Endocrinology

## 2020-08-20 NOTE — Patient Instructions (Addendum)
Please continue Levothyroxine 88 mcg daily.  Take the thyroid hormone every day, with water, at least 30 minutes before breakfast, separated by at least 4 hours from: - acid reflux medications - calcium - iron - multivitamins  Continue prednisone 5 mg daily.  Please remember: - You absolutely need to take this medication every day and not skip doses. - Please double the dose if you have a fever, for the duration of the fever. - If you cannot take anything by mouth (vomiting) or you have severe diarrhea so that you eliminate the Prednisone pills in your stool, please make sure that you get the Prednisone in the vein instead - go to the nearest emergency department/urgent care or you may go to your PCPs office   Please come back for labs in am without taking the prednisone and Levothyroxine.  Check with your Oncologist whether you can start on Xgeva.  Please come back for a follow-up appointment in 6 months.

## 2020-08-21 ENCOUNTER — Other Ambulatory Visit (INDEPENDENT_AMBULATORY_CARE_PROVIDER_SITE_OTHER): Payer: Medicare Other

## 2020-08-21 ENCOUNTER — Telehealth: Payer: Self-pay | Admitting: Internal Medicine

## 2020-08-21 DIAGNOSIS — M816 Localized osteoporosis [Lequesne]: Secondary | ICD-10-CM

## 2020-08-21 DIAGNOSIS — E038 Other specified hypothyroidism: Secondary | ICD-10-CM

## 2020-08-21 DIAGNOSIS — S32512S Fracture of superior rim of left pubis, sequela: Secondary | ICD-10-CM

## 2020-08-21 DIAGNOSIS — M8000XS Age-related osteoporosis with current pathological fracture, unspecified site, sequela: Secondary | ICD-10-CM

## 2020-08-21 LAB — VITAMIN D 25 HYDROXY (VIT D DEFICIENCY, FRACTURES): VITD: 66.31 ng/mL (ref 30.00–100.00)

## 2020-08-21 LAB — T4, FREE: Free T4: 1.01 ng/dL (ref 0.60–1.60)

## 2020-08-21 LAB — T3, FREE: T3, Free: 3.8 pg/mL (ref 2.3–4.2)

## 2020-08-21 NOTE — Telephone Encounter (Signed)
LVM for pt to rtn my call to schedule AWV with [facility]. Please schedule AWV with [facility] if pt calls the office.

## 2020-08-22 ENCOUNTER — Encounter: Payer: Self-pay | Admitting: Internal Medicine

## 2020-08-22 ENCOUNTER — Ambulatory Visit (INDEPENDENT_AMBULATORY_CARE_PROVIDER_SITE_OTHER)
Admission: RE | Admit: 2020-08-22 | Discharge: 2020-08-22 | Disposition: A | Discharge status: Home / Self Care | Payer: Medicare Other | Source: Ambulatory Visit | Attending: Internal Medicine | Admitting: Internal Medicine

## 2020-08-22 ENCOUNTER — Other Ambulatory Visit: Payer: Self-pay

## 2020-08-22 DIAGNOSIS — M810 Age-related osteoporosis without current pathological fracture: Secondary | ICD-10-CM | POA: Diagnosis not present

## 2020-08-24 DIAGNOSIS — G894 Chronic pain syndrome: Secondary | ICD-10-CM | POA: Insufficient documentation

## 2020-08-24 NOTE — Progress Notes (Signed)
Subjective:    Patient ID: Maxwell Fox, male    DOB: 09-18-1945, 75 y.o.   MRN: [id number]  HPI The patient is here for follow up of their chronic pain.     H/p pubis fracture, doing PT.  Has needed pain medication, but fx healed and now we are tapering off pain meds.   He has taken more pain medication than prescribed in the past for a knee injury and I have not filled early.  Since his last refill he has not taken any extra medication.    Last oxycodone refill 08/20/20 90 tab - 1 tab Q 8 hr prn.  Next refill 8/16.  He does start to feel some withdrawal symptoms as he approaches his next dose.  He does want to get off the pain medications but is concerned about withdrawal.   He is having muscular pain in legs.  He has some hip pain.  No back, knee or UE pain.       Medications and allergies reviewed with patient and updated if appropriate.  Patient Active Problem List   Diagnosis Date Noted   Chronic pain syndrome 08/24/2020   Gastric foreign body    Abnormal x-ray of small bowel    Acute gastric ulcer without hemorrhage or perforation    History of Roux-en-Y gastric bypass    Hemorrhagic shock (HCC)    Acute gastric ulcer with hemorrhage    Acute GI bleeding 02/14/2020   Acute blood loss anemia 02/14/2020   GI bleed 02/14/2020   Melena    Marginal ulcer    Gastroesophageal reflux disease with esophagitis without hemorrhage    Closed fracture of left superior pubic ramus ([place]) 02/09/2020   Anxiety and depression 01/10/2018   History of antineoplastic chemotherapy 07/19/2017   Chronic midline low back pain without sciatica 04/19/2017   Adrenal insufficiency due to cancer therapy ([place]) 07/27/2016   Secondary hypothyroidism 07/27/2016   H/O gastric bypass 01/06/2016   Pancreatic cyst 01/06/2016   Melanoma metastatic to brain ([place]) 12/31/2015   Iron deficiency anemia    B12 nutritional deficiency    Benign prostatic hyperplasia     Current Outpatient Medications on  File Prior to Visit  Medication Sig Dispense Refill   acetaminophen (TYLENOL) 325 MG tablet Take 2 tablets (650 mg total) by mouth every 6 (six) hours as needed for moderate pain.     calcium carbonate (OS-CAL - DOSED IN MG OF ELEMENTAL CALCIUM) 1250 (500 Ca) MG tablet Take 1 tablet by mouth daily with supper.     Cholecalciferol (VITAMIN D) 2000 UNITS tablet Take 1 tablet (2,000 Units total) by mouth daily. 30 tablet 11   clonazePAM (KLONOPIN) 1 MG tablet TAKE 1 TABLET(1 MG) BY MOUTH TWICE DAILY AS NEEDED FOR ANXIETY 60 tablet 2   escitalopram (LEXAPRO) 10 MG tablet TAKE 1 TABLET(10 MG) BY MOUTH DAILY 90 tablet 0   levothyroxine (SYNTHROID) 88 MCG tablet TAKE 1 TABLET(88 MCG) BY MOUTH DAILY BEFORE BREAKFAST 90 tablet 0   Multiple Vitamin (MULTIVITAMIN WITH MINERALS) TABS tablet Take 1 tablet by mouth daily.     omeprazole (PRILOSEC) 40 MG capsule Take 1 capsule (40 mg total) by mouth 2 (two) times daily. 60 capsule 6   Oxycodone HCl 10 MG TABS Take 1 tablet (10 mg total) by mouth every 8 (eight) hours as needed (chronic pain - pelvic fx). 90 tablet 0   polyethylene glycol (MIRALAX / GLYCOLAX) 17 g packet Take 17 g by mouth daily  as needed for mild constipation or moderate constipation. 14 each 0   predniSONE (DELTASONE) 5 MG tablet TAKE 1 TABLET BY MOUTH EVERY DAY WITH BREAKFAST 90 tablet 0   sucralfate (CARAFATE) 1 GM/10ML suspension Take 10 mLs (1 g total) by mouth 4 (four) times daily -  with meals and at bedtime. 1200 mL 3   tamsulosin (FLOMAX) 0.4 MG CAPS capsule TAKE 1 CAPSULE(0.4 MG) BY MOUTH DAILY AFTER SUPPER 90 capsule 1   No current facility-administered medications on file prior to visit.    Past Medical History:  Diagnosis Date   B12 nutritional deficiency    malabsorbtion   Blood transfusion without reported diagnosis 2022   BPH (benign prostatic hypertrophy)    Brain cancer ([place]) 2017   removed "golf ball sized mass"   Depression    on meds   Diverticulosis    GERD  (gastroesophageal reflux disease)    on meds   GI bleeding 02/2020   Hypertension    Iron deficiency anemia    malabsorption related (s/p gastric bypass)   Melanoma (HCC)    Obesity    s/p gastric bypass 2000, start 355#   Pancreatic cyst    Pulmonary nodules    Thyroid disease    on meds    Past Surgical History:  Procedure Laterality Date   CHOLECYSTECTOMY  2000   COLONOSCOPY WITH PROPOFOL N/A 03/05/2020   Procedure: COLONOSCOPY WITH PROPOFOL;  Surgeon: Irving Copas., MD;  Location: [facility] ENDOSCOPY;  Service: Gastroenterology;  Laterality: N/A;   CRANIOTOMY FOR TUMOR  12/11/2015   metastatic melanoma   ENTEROSCOPY N/A 03/30/2020   Procedure: ENTEROSCOPY;  Surgeon: Irene Shipper, MD;  Location: [facility] ENDOSCOPY;  Service: Endoscopy;  Laterality: N/A;   ESOPHAGOGASTRODUODENOSCOPY N/A 03/30/2020   Procedure: ESOPHAGOGASTRODUODENOSCOPY (EGD);  Surgeon: Irene Shipper, MD;  Location: [facility] END[facility];  Service: Endoscopy;  Laterality: N/A;   ESOPHAGOGASTRODUODENOSCOPY (EGD) WITH PROPOFOL N/A 02/14/2020   Procedure: ESOPHAGOGASTRODUODENOSCOPY (EGD) WITH PROPOFOL;  Surgeon: Lavena Bullion, DO;  Location: [facility] ENDOSCOPY;  Service: Endoscopy;  Laterality: N/A;   ESOPHAGOGASTRODUODENOSCOPY (EGD) WITH PROPOFOL N/A 02/26/2020   Procedure: ESOPHAGOGASTRODUODENOSCOPY (EGD) WITH PROPOFOL;  Surgeon: Irene Shipper, MD;  Location: [facility] ENDOSCOPY;  Service: Endoscopy;  Laterality: N/A;   ESOPHAGOGASTRODUODENOSCOPY (EGD) WITH PROPOFOL N/A 03/03/2020   Procedure: ESOPHAGOGASTRODUODENOSCOPY (EGD) WITH PROPOFOL;  Surgeon: Carol Ada, MD;  Location: [place];  Service: Endoscopy;  Laterality: N/A;   ESOPHAGOGASTRODUODENOSCOPY (EGD) WITH PROPOFOL N/A 03/28/2020   Procedure: ESOPHAGOGASTRODUODENOSCOPY (EGD) WITH PROPOFOL;  Surgeon: Irene Shipper, MD;  Location: [facility] ENDOSCOPY;  Service: Endoscopy;  Laterality: N/A;   FOREIGN BODY REMOVAL  03/30/2020   Procedure: FOREIGN BODY REMOVAL;  Surgeon: Irene Shipper,  MD;  Location: [facility] ENDOSCOPY;  Service: Endoscopy;;   GIVENS CAPSULE STUDY N/A 03/28/2020   Procedure: GIVENS CAPSULE STUDY;  Surgeon: Irene Shipper, MD;  Location: [facility] ENDOSCOPY;  Service: Endoscopy;  Laterality: N/A;   HEMOSTASIS CONTROL  02/26/2020   Procedure: HEMOSTASIS CONTROL;  Surgeon: Irene Shipper, MD;  Location: [facility] ENDOSCOPY;  Service: Endoscopy;;   HEMOSTASIS CONTROL  03/30/2020   Procedure: HEMOSTASIS CONTROL;  Surgeon: Irene Shipper, MD;  Location: [facility] ENDOSCOPY;  Service: Endoscopy;;   HOT HEMOSTASIS N/A 02/26/2020   Procedure: HOT HEMOSTASIS (ARGON PLASMA COAGULATION/BICAP);  Surgeon: Irene Shipper, MD;  Location: [facility] ENDOSCOPY;  Service: Endoscopy;  Laterality: N/A;   MOHS SURGERY  2012   melanoma, mid back, [place]  TOTAL SHOULDER REPLACEMENT Right 2011   [place]   WISDOM TOOTH EXTRACTION      Social History   Socioeconomic History   Marital status: Married    Spouse name: Not on file   Number of children: 2   Years of education: Not on file   Highest education level: Not on file  Occupational History   Occupation: general contractor-retired  Tobacco Use   Smoking status: Former    Types: Cigarettes    Quit date: 02/03/1963    Years since quitting: 47.6   Smokeless tobacco: Never  Vaping Use   Vaping Use: Never used  Substance and Sexual Activity   Alcohol use: Not Currently    Comment: wine 2x/week   Drug use: Never   Sexual activity: Not on file  Other Topics Concern   Not on file  Social History Narrative   Lives with wife   Retired - Conservator, 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   Social Determinants of Radio broadcast assistant Strain: Not on [employer] Insecurity: No Food Insecurity   Worried About Charity fundraiser in the Last Year: Never true   Arboriculturist in the Last Year: Never true  Transportation Needs: No Transportation Needs   Lack of Transportation (Medical): No   Lack of Transportation  (Non-Medical): No  Physical Activity: Not on file  Stress: Not on file  Social Connections: Not on file    Family History  Problem Relation Age of Onset   Hypertension Father    Macular degeneration Mother    Melanoma Mother    Melanoma Sister    Stomach cancer Sister 48       duodenal   Breast cancer Sister    Uterine cancer Sister    Melanoma Brother    Colon polyps Neg Hx    Colon cancer Neg Hx    Esophageal cancer Neg Hx    Rectal cancer Neg Hx     Review of Systems  Constitutional:  Negative for appetite change and fever.  Psychiatric/Behavioral:  Positive for dysphoric mood (mild) and sleep disturbance (sleep interrupted - pain with turning over). The patient is nervous/anxious.       Objective:   Vitals:   08/26/20 1000  BP: 112/70  Pulse: 68  Temp: 98.1 F (36.7 C)  SpO2: 99%   BP Readings from Last 3 Encounters:  08/26/20 112/70  08/20/20 110/78  07/23/20 122/80   Wt Readings from Last 3 Encounters:  08/26/20 180 lb (81.6 kg)  08/20/20 180 lb 12.8 oz (82 kg)  07/23/20 185 lb (83.9 kg)   Body mass index is 25.1 kg/m.   Physical Exam    Constitutional: Appears well-developed and well-nourished. No distress.  HENT:  Head: Normocephalic and atraumatic.  Neck: Neck supple. No tracheal deviation present. No thyromegaly present.  No cervical lymphadenopathy Cardiovascular: Normal rate, regular rhythm and normal heart sounds.   No murmur heard. No carotid bruit .  No edema Pulmonary/Chest: Effort normal and breath sounds normal. No respiratory distress. No has no wheezes. No rales.  Skin: Skin is warm and dry. Not diaphoretic.  Psychiatric: Normal mood and affect. Behavior is normal.      Assessment & Plan:    See Problem List for Assessment and Plan of chronic medical problems.    This visit occurred during the SARS-CoV-2 public health emergency.  Safety protocols were in place, including screening questions prior to the visit,  additional  usage of staff PPE, and extensive cleaning of exam room while observing appropriate contact time as indicated for disinfecting solutions.

## 2020-08-26 ENCOUNTER — Encounter: Payer: Self-pay | Admitting: Internal Medicine

## 2020-08-26 ENCOUNTER — Ambulatory Visit (INDEPENDENT_AMBULATORY_CARE_PROVIDER_SITE_OTHER): Payer: Medicare Other | Admitting: Internal Medicine

## 2020-08-26 ENCOUNTER — Other Ambulatory Visit: Payer: Self-pay

## 2020-08-26 VITALS — BP 112/70 | HR 68 | Temp 98.1°F | Ht 71.0 in | Wt 180.0 lb

## 2020-08-26 DIAGNOSIS — G894 Chronic pain syndrome: Secondary | ICD-10-CM | POA: Diagnosis not present

## 2020-08-26 DIAGNOSIS — S32512D Fracture of superior rim of left pubis, subsequent encounter for fracture with routine healing: Secondary | ICD-10-CM

## 2020-08-26 MED ORDER — GABAPENTIN 100 MG PO CAPS: 200.0000 mg | ORAL_CAPSULE | Freq: Every day | ORAL | 5 refills | Status: DC

## 2020-08-26 MED ORDER — DULOXETINE HCL 30 MG PO CPEP: 30.0000 mg | ORAL_CAPSULE | Freq: Every day | ORAL | 5 refills | Status: DC

## 2020-08-26 NOTE — Patient Instructions (Addendum)
   Medications changes include :   stop lexapro and start cymbalta 30 mg daily.  Start gabapentin 200 mg at bedtime.    Continue oxycodone 10 mg three times a day.  We will decrease this at your next refill.    Your prescription(s) have been submitted to your pharmacy. Please take as directed and contact our office if you believe you are having problem(s) with the medication(s).    Please followup in 6 weeks

## 2020-08-26 NOTE — Assessment & Plan Note (Signed)
History of close pubic fx Healed No longer seeing ortho Pain medication not needed for this - tapering off pain medication slowly

## 2020-08-26 NOTE — Assessment & Plan Note (Signed)
Chronic H/o pubic ramus fracture - went on pain medications at that time - fracture has healed, but now has significant weakness in legs and leg muscle pain, hip pain No longer needs pain medication but needs to taper off pain medication very slowly to avoid increased pain and withdrawal He does have leg muscle weakness Start gabapentin 200 mg HS and change lexapro to cymbalta 30 mg daily to help with some of his muscular/chronic pain Continue oxycodone 10 mg TID until next refill - about 8/16 - then will decrease by 5 mg ( from 30 mg daily to 25 mg daily)  Will slowly decrease each month

## 2020-08-27 ENCOUNTER — Other Ambulatory Visit: Payer: Self-pay | Admitting: *Deleted

## 2020-08-28 NOTE — Patient Outreach (Signed)
[facility]) Care Management  08/28/2020  Elihue Lundsten [birth year] [id number]   Call was unsuccessful. Left a message to return call. Will send unsuccessful letter.  [name] Myrtie Neither, MSN, [facility] Gerontological Nurse Practitioner [facility] Care Management [phone]

## 2020-08-30 DIAGNOSIS — M6281 Muscle weakness (generalized): Secondary | ICD-10-CM | POA: Diagnosis not present

## 2020-08-30 DIAGNOSIS — M25652 Stiffness of left hip, not elsewhere classified: Secondary | ICD-10-CM | POA: Diagnosis not present

## 2020-08-30 DIAGNOSIS — S32512D Fracture of superior rim of left pubis, subsequent encounter for fracture with routine healing: Secondary | ICD-10-CM | POA: Diagnosis not present

## 2020-09-03 ENCOUNTER — Other Ambulatory Visit: Payer: Self-pay | Admitting: *Deleted

## 2020-09-03 DIAGNOSIS — M6281 Muscle weakness (generalized): Secondary | ICD-10-CM | POA: Diagnosis not present

## 2020-09-03 DIAGNOSIS — S32512D Fracture of superior rim of left pubis, subsequent encounter for fracture with routine healing: Secondary | ICD-10-CM | POA: Diagnosis not present

## 2020-09-03 DIAGNOSIS — M25652 Stiffness of left hip, not elsewhere classified: Secondary | ICD-10-CM | POA: Diagnosis not present

## 2020-09-03 NOTE — Patient Outreach (Signed)
[facility]) Care Management  [place]  09/03/2020   Luster Streetman Jan 20, 1946 [id number]  Subjective: Outreach call to determine pt's continued involvement with [facility] as pt has not engaged in last 2 months. Followed for recovery from fx pelvis.  Encounter Medications:  Outpatient Encounter Medications as of 09/03/2020  Medication Sig   acetaminophen (TYLENOL) 325 MG tablet Take 2 tablets (650 mg total) by mouth every 6 (six) hours as needed for moderate pain.   calcium carbonate (OS-CAL - DOSED IN MG OF ELEMENTAL CALCIUM) 1250 (500 Ca) MG tablet Take 1 tablet by mouth daily with supper.   Cholecalciferol (VITAMIN D) 2000 UNITS tablet Take 1 tablet (2,000 Units total) by mouth daily.   clonazePAM (KLONOPIN) 1 MG tablet TAKE 1 TABLET(1 MG) BY MOUTH TWICE DAILY AS NEEDED FOR ANXIETY   DULoxetine (CYMBALTA) 30 MG capsule Take 1 capsule (30 mg total) by mouth daily.   gabapentin (NEURONTIN) 100 MG capsule Take 2 capsules (200 mg total) by mouth at bedtime.   levothyroxine (SYNTHROID) 88 MCG tablet TAKE 1 TABLET(88 MCG) BY MOUTH DAILY BEFORE BREAKFAST   Multiple Vitamin (MULTIVITAMIN WITH MINERALS) TABS tablet Take 1 tablet by mouth daily.   omeprazole (PRILOSEC) 40 MG capsule Take 1 capsule (40 mg total) by mouth 2 (two) times daily.   Oxycodone HCl 10 MG TABS Take 1 tablet (10 mg total) by mouth every 8 (eight) hours as needed (chronic pain - pelvic fx).   polyethylene glycol (MIRALAX / GLYCOLAX) 17 g packet Take 17 g by mouth daily as needed for mild constipation or moderate constipation.   predniSONE (DELTASONE) 5 MG tablet TAKE 1 TABLET BY MOUTH EVERY DAY WITH BREAKFAST   sucralfate (CARAFATE) 1 GM/10ML suspension Take 10 mLs (1 g total) by mouth 4 (four) times daily -  with meals and at bedtime.   tamsulosin (FLOMAX) 0.4 MG CAPS capsule TAKE 1 CAPSULE(0.4 MG) BY MOUTH DAILY AFTER SUPPER   No facility-administered encounter medications on file as of  09/03/2020.    Functional Status:  In your present state of health, do you have any difficulty performing the following activities: 03/27/2020 03/27/2020  Hearing? - N  Vision? - N  Difficulty concentrating or making decisions? - N  Walking or climbing stairs? - Y  Comment - -  Dressing or bathing? - Y  Doing errands, shopping? Y -  Some recent data might be hidden    Fall/Depression Screening: Fall Risk  07/23/2020 02/09/2020 03/28/2018  Falls in the past year? 0 0 0  Number falls in past yr: 0 0 -  Injury with Fall? 0 1 -  Risk for fall due to : - Impaired balance/gait -  Follow up - Falls evaluation completed -   PHQ 2/9 Scores 07/23/2020 02/09/2020 03/31/2018 03/28/2018 03/28/2018 01/10/2018 01/07/2017  PHQ - 2 Score 0 0 1 0 0 5 0  PHQ- 9 Score - - 3 0 - 12 -    Assessment: Oxycodone dependence - titration in process                        Hx pelvic fx  Care Plan   Goals Addressed               This Visit's Progress     Patient Stated     Extend this goal for additional 2 months Starting 05/08/20: Will work with PT to regain mobility and be able to walk without assistance  over the next 2 months. (pt-stated)   On track     03/13/20 Long term goal  Start date 03/13/20 Expected end date 08/01/20, extending goal additional 2 months 11/05/20  Barriers: None  03/13/20 Discussed pt personal goals. He hopes to be able to walk without his walker in 2 months. He will resume PT tomorrow. NP has encouraged him to participate 100% in the therapy and to do the recommended exercises on other days. 04/03/20 Pt has had a set back, another hospitalization  (5 days) for another GI Bleed. The specialists are hopeful that they found the area of concern and have corrected the problem. He will restart his PT today. He feels the endoscopy positioning which was on the area of the fracture did not help in the healing process. He is very sore. He did talk with his provider about pain medication to help him get  through the therapy and she is working with him on that. Encouraged him to give 100% during his PT sessions  and do the recommended exercises all other days. Continue safety practices to avoid falls. 04/10/20 Pt has not had any health occurances for a week and has been able to get back to his PT. This is going well per his report. He still has pain but expects this will get better as he is able to move more freely. Encouraged exercises on days PT does not come. He has not had any falls. 05/08/20 Pt has completed his in home PT. He is able to walk with a cane in his home and uses a walker if he leaves his home for stability. He denies any falls. He will begin outpt PT sometime this month. We will extend this goal another 2 months so he can reach his initial goal of walking without assistance which he should be able to reach. Reminded of fall precautions. Discussed informing his outpt PT about his hx of injury and set backs, share his fear of being hurt and set back again. Encouraged him to tell PT when he is uncomfortable. 06/12/20 Making progress with PT and home mobility. Using a cane. Did walk from one chair to another in PT this week without assistance but he felt scared. He does his exercises at home. His stride has lengthened. Still requiring pain medication, especially important before and after PT. Suggested using an ice pack when he returns home to help with the pain so he can start weaning down from 4 oxycodone per day. He did have a scan at [facility] which revealed a secondary fx in his pelvis that was originally not recognized after his fall. There is question as to when this occurred. Reinforced safety/fall precautions and continued efforts in PT and home exercise. 06/27/20 Pt is now able to ambulate with or without a cane. He continues to make progress with his PT. He is happy about this and feels that he will meet his goal. Reinforced safety precautions. 09/03/20 Pt has not answered calls in June or July. Was able  to talk with him today. He reports he is still working with PT. He had a set back in which his wife fell, he was helping her get up and he fell also and twisted and strained in L knee. He has recovered from this and continues to work on gaining his muscle strength. He thinks he may be finishing with PT this month. He continue to be on oxycodone. He is being titrated down from 4 a day, now on 3 a  day. Dr. Quay Burow is overseeing this process. Encouraged full participation with Pt and med reduction.      Other     Pt will reseach possibilty of using CBD product over the next 2 months and report to NP on 11/05/20.        Start date: 09/03/20 Priority - low Long term goal Expected end date: 11/05/20 Barriers: Knowledge  09/03/20: Pt having pain for extended period of time and has become dependent on this and titration is difficult. Discussed consideration of CBD products. Provided education. Advised not good evidence for it's use but it would be safe at recommended doses and not interfere with his present prescription medications. Advised of legality in [place] for products. Will provide additional information so that pt can discuss with Dr. Quay Burow and his brothers who are MDs and get their input.          Plan:  Follow-up: Patient agrees to Care Plan and Follow-up. Follow-up in 2 month(s)  Kayleen Memos C. Myrtie Neither, MSN, [facility] Gerontological Nurse Practitioner [facility] Care Management [phone]

## 2020-09-05 DIAGNOSIS — M25652 Stiffness of left hip, not elsewhere classified: Secondary | ICD-10-CM | POA: Diagnosis not present

## 2020-09-05 DIAGNOSIS — S32512D Fracture of superior rim of left pubis, subsequent encounter for fracture with routine healing: Secondary | ICD-10-CM | POA: Diagnosis not present

## 2020-09-05 DIAGNOSIS — M6281 Muscle weakness (generalized): Secondary | ICD-10-CM | POA: Diagnosis not present

## 2020-09-12 ENCOUNTER — Other Ambulatory Visit: Payer: Self-pay | Admitting: Internal Medicine

## 2020-09-17 ENCOUNTER — Encounter: Payer: Self-pay | Admitting: Internal Medicine

## 2020-09-17 DIAGNOSIS — G893 Neoplasm related pain (acute) (chronic): Secondary | ICD-10-CM

## 2020-09-17 DIAGNOSIS — S32512D Fracture of superior rim of left pubis, subsequent encounter for fracture with routine healing: Secondary | ICD-10-CM

## 2020-09-18 MED ORDER — OXYCODONE HCL 10 MG PO TABS: ORAL_TABLET | ORAL | 0 refills | Status: DC

## 2020-09-26 ENCOUNTER — Other Ambulatory Visit: Payer: Self-pay | Admitting: Internal Medicine

## 2020-10-02 ENCOUNTER — Telehealth: Payer: Self-pay | Admitting: Internal Medicine

## 2020-10-02 NOTE — Telephone Encounter (Signed)
Pt needs to know if he needs to continue taking Omeprazole. Pls call him.

## 2020-10-02 NOTE — Telephone Encounter (Signed)
Pt called and wants to know if he needs to continue his omeprazole. Please advise.

## 2020-10-02 NOTE — Telephone Encounter (Signed)
Message left on pt answering machine per pt request stating Dr. Hilarie Fredrickson recommendations regarding the omeprazole.

## 2020-10-02 NOTE — Telephone Encounter (Signed)
Given his history of bleeding anastomotic ulceration I would recommend the following: If he is using medications which can predispose to ulceration in the GI tract including aspirin, NSAIDs or prednisone I would recommend he continue omeprazole 40 mg once daily If he is not using any of the above medications this can be stopped and restarted should he be prescribed or begin taking aspirin, NSAIDs or corticosteroid/prednisone

## 2020-10-03 NOTE — Telephone Encounter (Signed)
Patient returned call. States if he does not answer please leave message. Says there is an answering machine. Best contact [phone]

## 2020-10-07 NOTE — Progress Notes (Signed)
Subjective:    Patient ID: Maxwell Fox, male    DOB: 09-12-1945, 75 y.o.   MRN: [id number]  HPI The patient is here for follow up of their chronic medical problems, including chronic pain management  Tried cymbalta, gabapentin and they did not help with is pain so he stopped them.   He continues to have b/l leg pain.  It is worse the more he does.  No UE pain except his R shoulder which he had replaced.  He is unstable and uses his cane.  He did not have the leg pain prior to his pelvic fracture.  We are slowly tapering him off of the pain medication.  I have agreed to go down to 5 mg each refill/each month.  At times he takes medication as needed and will take an extra dose.  He has had to go through withdrawal because he will run out early.  Stressed that he needs to be taking the medication as prescribed so that he does not run out.  He understands that we will not fill this early and that he will tolerate decreasing the dose better if he is taking the medication appropriately and does not have to go through withdrawal symptoms because he uses more than he should.  Oxycodone prescription filled 8/20 - next rx due 9/17   Medications and allergies reviewed with patient and updated if appropriate.  Patient Active Problem List   Diagnosis Date Noted   Chronic pain syndrome 08/24/2020   Gastric foreign body    Abnormal x-ray of small bowel    Acute gastric ulcer without hemorrhage or perforation    History of Roux-en-Y gastric bypass    Hemorrhagic shock (HCC)    Acute gastric ulcer with hemorrhage    Acute GI bleeding 02/14/2020   Acute blood loss anemia 02/14/2020   GI bleed 02/14/2020   Melena    Marginal ulcer    Gastroesophageal reflux disease with esophagitis without hemorrhage    Closed fracture of left superior pubic ramus ([place]) 02/09/2020   Anxiety and depression 01/10/2018   History of antineoplastic chemotherapy 07/19/2017   Chronic midline low back pain without  sciatica 04/19/2017   Adrenal insufficiency due to cancer therapy ([place]) 07/27/2016   Secondary hypothyroidism 07/27/2016   H/O gastric bypass 01/06/2016   Pancreatic cyst 01/06/2016   Melanoma metastatic to brain ([place]) 12/31/2015   Iron deficiency anemia    B12 nutritional deficiency    Benign prostatic hyperplasia     Current Outpatient Medications on File Prior to Visit  Medication Sig Dispense Refill   acetaminophen (TYLENOL) 325 MG tablet Take 2 tablets (650 mg total) by mouth every 6 (six) hours as needed for moderate pain.     calcium carbonate (OS-CAL - DOSED IN MG OF ELEMENTAL CALCIUM) 1250 (500 Ca) MG tablet Take 1 tablet by mouth daily with supper.     Cholecalciferol (VITAMIN D) 2000 UNITS tablet Take 1 tablet (2,000 Units total) by mouth daily. 30 tablet 11   clonazePAM (KLONOPIN) 1 MG tablet TAKE 1 TABLET(1 MG) BY MOUTH TWICE DAILY AS NEEDED FOR ANXIETY 60 tablet 0   levothyroxine (SYNTHROID) 88 MCG tablet TAKE 1 TABLET(88 MCG) BY MOUTH DAILY BEFORE BREAKFAST 90 tablet 0   Multiple Vitamin (MULTIVITAMIN WITH MINERALS) TABS tablet Take 1 tablet by mouth daily.     omeprazole (PRILOSEC) 40 MG capsule Take 1 capsule (40 mg total) by mouth 2 (two) times daily. 60 capsule 6   Oxycodone HCl  10 MG TABS 1 tab in am, 1/2 tab in afternoon and 1 tab at night 75 tablet 0   polyethylene glycol (MIRALAX / GLYCOLAX) 17 g packet Take 17 g by mouth daily as needed for mild constipation or moderate constipation. 14 each 0   predniSONE (DELTASONE) 5 MG tablet TAKE 1 TABLET BY MOUTH EVERY DAY WITH BREAKFAST 90 tablet 0   tamsulosin (FLOMAX) 0.4 MG CAPS capsule TAKE 1 CAPSULE(0.4 MG) BY MOUTH DAILY AFTER SUPPER 90 capsule 1   No current facility-administered medications on file prior to visit.    Past Medical History:  Diagnosis Date   B12 nutritional deficiency    malabsorbtion   Blood transfusion without reported diagnosis 2022   BPH (benign prostatic hypertrophy)    Brain cancer ([place])  2017   removed "golf ball sized mass"   Depression    on meds   Diverticulosis    GERD (gastroesophageal reflux disease)    on meds   GI bleeding 02/2020   Hypertension    Iron deficiency anemia    malabsorption related (s/p gastric bypass)   Melanoma (HCC)    Obesity    s/p gastric bypass 2000, start 355#   Pancreatic cyst    Pulmonary nodules    Thyroid disease    on meds    Past Surgical History:  Procedure Laterality Date   CHOLECYSTECTOMY  2000   COLONOSCOPY WITH PROPOFOL N/A 03/05/2020   Procedure: COLONOSCOPY WITH PROPOFOL;  Surgeon: Irving Copas., MD;  Location: [facility] ENDOSCOPY;  Service: Gastroenterology;  Laterality: N/A;   CRANIOTOMY FOR TUMOR  12/11/2015   metastatic melanoma   ENTEROSCOPY N/A 03/30/2020   Procedure: ENTEROSCOPY;  Surgeon: Irene Shipper, MD;  Location: [facility] ENDOSCOPY;  Service: Endoscopy;  Laterality: N/A;   ESOPHAGOGASTRODUODENOSCOPY N/A 03/30/2020   Procedure: ESOPHAGOGASTRODUODENOSCOPY (EGD);  Surgeon: Irene Shipper, MD;  Location: [facility] ENDOSCOPY;  Service: Endoscopy;  Laterality: N/A;   ESOPHAGOGASTRODUODENOSCOPY (EGD) WITH PROPOFOL N/A 02/14/2020   Procedure: ESOPHAGOGASTRODUODENOSCOPY (EGD) WITH PROPOFOL;  Surgeon: Lavena Bullion, DO;  Location: [facility] ENDOSCOPY;  Service: Endoscopy;  Laterality: N/A;   ESOPHAGOGASTRODUODENOSCOPY (EGD) WITH PROPOFOL N/A 02/26/2020   Procedure: ESOPHAGOGASTRODUODENOSCOPY (EGD) WITH PROPOFOL;  Surgeon: Irene Shipper, MD;  Location: [facility] ENDOSCOPY;  Service: Endoscopy;  Laterality: N/A;   ESOPHAGOGASTRODUODENOSCOPY (EGD) WITH PROPOFOL N/A 03/03/2020   Procedure: ESOPHAGOGASTRODUODENOSCOPY (EGD) WITH PROPOFOL;  Surgeon: Carol Ada, MD;  Location: [place];  Service: Endoscopy;  Laterality: N/A;   ESOPHAGOGASTRODUODENOSCOPY (EGD) WITH PROPOFOL N/A 03/28/2020   Procedure: ESOPHAGOGASTRODUODENOSCOPY (EGD) WITH PROPOFOL;  Surgeon: Irene Shipper, MD;  Location: [facility] ENDOSCOPY;  Service: Endoscopy;  Laterality: N/A;    FOREIGN BODY REMOVAL  03/30/2020   Procedure: FOREIGN BODY REMOVAL;  Surgeon: Irene Shipper, MD;  Location: [facility] ENDOSCOPY;  Service: Endoscopy;;   GIVENS CAPSULE STUDY N/A 03/28/2020   Procedure: GIVENS CAPSULE STUDY;  Surgeon: Irene Shipper, MD;  Location: [facility] ENDOSCOPY;  Service: Endoscopy;  Laterality: N/A;   HEMOSTASIS CONTROL  02/26/2020   Procedure: HEMOSTASIS CONTROL;  Surgeon: Irene Shipper, MD;  Location[facility]COPY;  Service: Endoscopy;;   HEMOSTASIS CONTROL  03/30/2020   Procedure: HEMOSTASIS CONTROL;  Surgeon: Irene Shipper, MD;  Location: [facility] ENDOSCOPY;  Service: Endoscopy;;   HOT HEMOSTASIS N/A 02/26/2020   Procedure: HOT HEMOSTASIS (ARGON PLASMA COAGULATION/BICAP);  Surgeon: Irene Shipper, MD;  Location: [facility] ENDOSCOPY;  Service: Endoscopy;  Laterality: N/A;   MOHS SURGERY  2012   melanoma, mid back, [place]  ROUX-EN-Y PROCEDURE  2000   TOTAL SHOULDER REPLACEMENT Right 2011   [place]   WISDOM TOOTH EXTRACTION      Social History   Socioeconomic History   Marital status: Married    Spouse name: Not on file   Number of children: 2   Years of education: Not on file   Highest education level: Not on file  Occupational History   Occupation: general contractor-retired  Tobacco Use   Smoking status: Former    Types: Cigarettes    Quit date: 02/03/1963    Years since quitting: 57.7   Smokeless tobacco: Never  Vaping Use   Vaping Use: Never used  Substance and Sexual Activity   Alcohol use: Not Currently    Comment: wine 2x/week   Drug use: Never   Sexual activity: Not on file  Other Topics Concern   Not on file  Social History Narrative   Lives with wife   Retired - Conservator, 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 [place] Strain: Not on [employer] Insecurity: No Food Insecurity   Worried About Charity fundraiser in the Last Year: Never true   Arboriculturist in the Last Year: Never true  Transportation  Needs: No Transportation Needs   Lack of Transportation (Medical): No   Lack of Transportation (Non-Medical): No  Physical Activity: Not on file  Stress: Not on file  Social Connections: Not on file    Family History  Problem Relation Age of Onset   Hypertension Father    Macular degeneration Mother    Melanoma Mother    Melanoma Sister    Stomach cancer Sister 77       duodenal   Breast cancer Sister    Uterine cancer Sister    Melanoma Brother    Colon polyps Neg Hx    Colon cancer Neg Hx    Esophageal cancer Neg Hx    Rectal cancer Neg Hx     Review of Systems  Constitutional:  Negative for fever.  Musculoskeletal:  Negative for back pain.       No Hip or pevlic pain   Neurological:  Negative for numbness.      Objective:   Vitals:   10/08/20 0958  BP: 120/68  Pulse: 72  Temp: 97.9 F (36.6 C)  SpO2: 99%   BP Readings from Last 3 Encounters:  10/08/20 120/68  08/26/20 112/70  08/20/20 110/78   Wt Readings from Last 3 Encounters:  10/08/20 182 lb (82.6 kg)  08/26/20 180 lb (81.6 kg)  08/20/20 180 lb 12.8 oz (82 kg)   Body mass index is 25.38 kg/m.   Physical Exam    Constitutional: Appears well-developed and well-nourished. No distress.  Skin: Skin is warm and dry. Not diaphoretic.  Psychiatric: Normal mood and affect. Behavior is normal.      Assessment & Plan:    See Problem List for Assessment and Plan of chronic medical problems.    This visit occurred during the SARS-CoV-2 public health emergency.  Safety protocols were in place, including screening questions prior to the visit, additional usage of staff PPE, and extensive cleaning of exam room while observing appropriate contact time as indicated for disinfecting solutions.

## 2020-10-07 NOTE — Patient Instructions (Addendum)
  Flu immunization administered today.    Medications changes include :   none     Please followup in 2 months

## 2020-10-08 ENCOUNTER — Encounter: Payer: Self-pay | Admitting: Internal Medicine

## 2020-10-08 ENCOUNTER — Other Ambulatory Visit: Payer: Self-pay

## 2020-10-08 ENCOUNTER — Ambulatory Visit (INDEPENDENT_AMBULATORY_CARE_PROVIDER_SITE_OTHER): Payer: Medicare Other | Admitting: Internal Medicine

## 2020-10-08 VITALS — BP 120/68 | HR 72 | Temp 97.9°F | Ht 71.0 in | Wt 182.0 lb

## 2020-10-08 DIAGNOSIS — F32A Depression, unspecified: Secondary | ICD-10-CM

## 2020-10-08 DIAGNOSIS — S32512D Fracture of superior rim of left pubis, subsequent encounter for fracture with routine healing: Secondary | ICD-10-CM

## 2020-10-08 DIAGNOSIS — G894 Chronic pain syndrome: Secondary | ICD-10-CM | POA: Diagnosis not present

## 2020-10-08 DIAGNOSIS — Z23 Encounter for immunization: Secondary | ICD-10-CM | POA: Diagnosis not present

## 2020-10-08 DIAGNOSIS — F419 Anxiety disorder, unspecified: Secondary | ICD-10-CM

## 2020-10-08 MED ORDER — ESCITALOPRAM OXALATE 20 MG PO TABS: 20.0000 mg | ORAL_TABLET | Freq: Every day | ORAL | 1 refills | Status: DC

## 2020-10-08 NOTE — Assessment & Plan Note (Addendum)
History of left superior pubic ramus fracture Healed Denies any pelvic or hip pain, but since the fracture has been experiencing leg pain which is why he is on the medication goal is to slowly taper off the oxycodone in my opinion does not need to be on pain medication so we will continue this slow taper Continue oxycodone 10 mg tabs-1 tab in a.m., half tab in afternoon and 1 tab at night Next refill due 9/17 and we will decrease current daily dose by 5 mg-we will decrease to 10 mg twice daily

## 2020-10-08 NOTE — Assessment & Plan Note (Addendum)
Chronic Controlled, stable Continue lexapro 20 mg daily Continue clonazepam 1 mg bid prn

## 2020-10-17 ENCOUNTER — Encounter: Payer: Self-pay | Admitting: Internal Medicine

## 2020-10-17 ENCOUNTER — Other Ambulatory Visit: Payer: Self-pay | Admitting: Internal Medicine

## 2020-10-17 DIAGNOSIS — G893 Neoplasm related pain (acute) (chronic): Secondary | ICD-10-CM

## 2020-10-17 DIAGNOSIS — S32512D Fracture of superior rim of left pubis, subsequent encounter for fracture with routine healing: Secondary | ICD-10-CM

## 2020-10-17 MED ORDER — OXYCODONE HCL 10 MG PO TABS: 10.0000 mg | ORAL_TABLET | Freq: Two times a day (BID) | ORAL | 0 refills | Status: DC | PRN

## 2020-10-18 DIAGNOSIS — Z08 Encounter for follow-up examination after completed treatment for malignant neoplasm: Secondary | ICD-10-CM | POA: Diagnosis not present

## 2020-10-18 DIAGNOSIS — Z85820 Personal history of malignant melanoma of skin: Secondary | ICD-10-CM | POA: Diagnosis not present

## 2020-10-18 DIAGNOSIS — L821 Other seborrheic keratosis: Secondary | ICD-10-CM | POA: Diagnosis not present

## 2020-10-18 DIAGNOSIS — D485 Neoplasm of uncertain behavior of skin: Secondary | ICD-10-CM | POA: Diagnosis not present

## 2020-10-18 DIAGNOSIS — D225 Melanocytic nevi of trunk: Secondary | ICD-10-CM | POA: Diagnosis not present

## 2020-10-18 DIAGNOSIS — L814 Other melanin hyperpigmentation: Secondary | ICD-10-CM | POA: Diagnosis not present

## 2020-10-18 DIAGNOSIS — C439 Malignant melanoma of skin, unspecified: Secondary | ICD-10-CM | POA: Diagnosis not present

## 2020-10-18 DIAGNOSIS — D1801 Hemangioma of skin and subcutaneous tissue: Secondary | ICD-10-CM | POA: Diagnosis not present

## 2020-10-21 ENCOUNTER — Other Ambulatory Visit: Payer: Self-pay | Admitting: Physician Assistant

## 2020-10-24 ENCOUNTER — Encounter: Payer: Self-pay | Admitting: Internal Medicine

## 2020-10-29 ENCOUNTER — Ambulatory Visit (INDEPENDENT_AMBULATORY_CARE_PROVIDER_SITE_OTHER): Payer: Medicare Other | Admitting: Podiatry

## 2020-10-29 ENCOUNTER — Ambulatory Visit (INDEPENDENT_AMBULATORY_CARE_PROVIDER_SITE_OTHER): Payer: Medicare Other

## 2020-10-29 ENCOUNTER — Other Ambulatory Visit: Payer: Self-pay

## 2020-10-29 DIAGNOSIS — M216X1 Other acquired deformities of right foot: Secondary | ICD-10-CM

## 2020-10-29 DIAGNOSIS — L84 Corns and callosities: Secondary | ICD-10-CM | POA: Diagnosis not present

## 2020-10-29 DIAGNOSIS — M722 Plantar fascial fibromatosis: Secondary | ICD-10-CM

## 2020-10-29 DIAGNOSIS — M79671 Pain in right foot: Secondary | ICD-10-CM

## 2020-11-01 NOTE — Progress Notes (Signed)
Subjective:   Patient ID: Maxwell Fox, male   DOB: 75 y.o.   MRN: [id number]   HPI 75 year old male presents the office today for concerns of pain underneath his right fifth toe area which is been ongoing about 3 months.  Denies any open lesion to the area denies swelling or redness or any drainage.  He states the area is very sore mostly with walking.  Tries to get a pad on the area which helps minimally.  No other concerns.  No injury.   Review of Systems  All other systems reviewed and are negative.  Past Medical History:  Diagnosis Date   B12 nutritional deficiency    malabsorbtion   Blood transfusion without reported diagnosis 2022   BPH (benign prostatic hypertrophy)    Brain cancer ([place]) 2017   removed "golf ball sized mass"   Depression    on meds   Diverticulosis    GERD (gastroesophageal reflux disease)    on meds   GI bleeding 02/2020   Hypertension    Iron deficiency anemia    malabsorption related (s/p gastric bypass)   Melanoma (HCC)    Obesity    s/p gastric bypass 2000, start 355#   Pancreatic cyst    Pulmonary nodules    Thyroid disease    on meds    Past Surgical History:  Procedure Laterality Date   CHOLECYSTECTOMY  2000   COLONOSCOPY WITH PROPOFOL N/A 03/05/2020   Procedure: COLONOSCOPY WITH PROPOFOL;  Surgeon: Irving Copas., MD;  Location: [facility] ENDOSCOPY;  Service: Gastroenterology;  Laterality: N/A;   CRANIOTOMY FOR TUMOR  12/11/2015   metastatic melanoma   ENTEROSCOPY N/A 03/30/2020   Procedure: ENTEROSCOPY;  Surgeon: Irene Shipper, MD;  [facility] ENDOSCOPY;  Service: Endoscopy;  Laterality: N/A;   ESOPHAGOGASTRODUODENOSCOPY N/A 03/30/2020   Procedure: ESOPHAGOGASTRODUODENOSCOPY (EGD);  Surgeon: Irene Shipper, MD;  Location: [facility] ENDOSCOPY;  Service: Endoscopy;  Laterality: N/A;   ESOPHAGOGASTRODUODENOSCOPY (EGD) WITH PROPOFOL N/A 02/14/2020   Procedure: ESOPHAGOGASTRODUODENOSCOPY (EGD) WITH PROPOFOL;  Surgeon: Lavena Bullion, DO;   Location: [facility] ENDOSCOPY;  Service: Endoscopy;  Laterality: N/A;   ESOPHAGOGASTRODUODENOSCOPY (EGD) WITH PROPOFOL N/A 02/26/2020   Procedure: ESOPHAGOGASTRODUODENOSCOPY (EGD) WITH PROPOFOL;  Surgeon: Irene Shipper, MD;  Location[facility] ENDOSCOPY;  Service: Endoscopy;  Laterality: N/A;   ESOPHAGOGASTRODUODENOSCOPY (EGD) WITH PROPOFOL N/A 03/03/2020   Procedure: ESOPHAGOGASTRODUODENOSCOPY (EGD) WITH PROPOFOL;  Surgeon: Carol Ada, MD;  Location: [place];  Service: Endoscopy;  Laterality: N/A;   ESOPHAGOGASTRODUODENOSCOPY (EGD) WITH PROPOFOL N/A 03/28/2020   Procedure: ESOPHAGOGASTRODUODENOSCOPY (EGD) WITH PROPOFOL;  Surgeon: Irene Shipper, MD;  Location: [facility] ENDOSCOPY;  Service: Endoscopy;  Laterality: N/A;   FOREIGN BODY REMOVAL  03/30/2020   Procedure: FOREIGN BODY REMOVAL;  Surgeon: Irene Shipper, MD;  Location: [facility] ENDOSCOPY;  Service: Endoscopy;;   GIVENS CAPSULE STUDY N/A 03/28/2020   Procedure: GIVENS CAPSULE STUDY;  Surgeon: Irene Shipper, MD;  Location: [facility] ENDOSCOPY;  Service: Endoscopy;  Laterality: N/A;   HEMOSTASIS CONTROL  02/26/2020   Procedure: HEMOSTASIS CONTROL;  Surgeon: Irene Shipper, MD;  Location: [facility] ENDOSCOPY;  Service: Endoscopy;;   HEMOSTASIS CONTROL  03/30/2020   Procedure: HEMOSTASIS CONTROL;  Surgeon: Irene Shipper, MD;  Location: [facility] ENDOSCOPY;  Service: Endoscopy;;   HOT HEMOSTASIS N/A 02/26/2020   Procedure: HOT HEMOSTASIS (ARGON PLASMA COAGULATION/BICAP);  Surgeon: Irene Shipper, MD;  Location: [facility] ENDOSCOPY;  Service: Endoscopy;  Laterality: N/A;   MOHS SURGERY  2012   melanoma, mid back,  [place]   TOTAL SHOULDER REPLACEMENT Right 2011   [place]   WISDOM TOOTH EXTRACTION       Current Outpatient Medications:    acetaminophen (TYLENOL) 325 MG tablet, Take 2 tablets (650 mg total) by mouth every 6 (six) hours as needed for moderate pain., Disp: , Rfl:    calcium carbonate (OS-CAL - DOSED IN MG OF ELEMENTAL CALCIUM) 1250 (500 Ca) MG tablet,  Take 1 tablet by mouth daily with supper., Disp: , Rfl:    Cholecalciferol (VITAMIN D) 2000 UNITS tablet, Take 1 tablet (2,000 Units total) by mouth daily., Disp: 30 tablet, Rfl: 11   clonazePAM (KLONOPIN) 1 MG tablet, TAKE 1 TABLET(1 MG) BY MOUTH TWICE DAILY AS NEEDED FOR ANXIETY, Disp: 60 tablet, Rfl: 0   escitalopram (LEXAPRO) 20 MG tablet, Take 1 tablet (20 mg total) by mouth daily., Disp: 90 tablet, Rfl: 1   levothyroxine (SYNTHROID) 88 MCG tablet, TAKE 1 TABLET(88 MCG) BY MOUTH DAILY BEFORE BREAKFAST, Disp: 90 tablet, Rfl: 0   Multiple Vitamin (MULTIVITAMIN WITH MINERALS) TABS tablet, Take 1 tablet by mouth daily., Disp: , Rfl:    omeprazole (PRILOSEC) 40 MG capsule, TAKE 1 CAPSULE(40 MG) BY MOUTH TWICE DAILY, Disp: 180 capsule, Rfl: 0   Oxycodone HCl 10 MG TABS, Take 1 tablet (10 mg total) by mouth 2 (two) times daily as needed (chronic pain from pelvic fx)., Disp: 60 tablet, Rfl: 0   polyethylene glycol (MIRALAX / GLYCOLAX) 17 g packet, Take 17 g by mouth daily as needed for mild constipation or moderate constipation., Disp: 14 each, Rfl: 0   predniSONE (DELTASONE) 5 MG tablet, TAKE 1 TABLET BY MOUTH EVERY DAY WITH BREAKFAST, Disp: 90 tablet, Rfl: 0   tamsulosin (FLOMAX) 0.4 MG CAPS capsule, TAKE 1 CAPSULE(0.4 MG) BY MOUTH DAILY AFTER SUPPER, Disp: 90 capsule, Rfl: 1  No Known Allergies        Objective:  Physical Exam  General: AAO x3, NAD  Dermatological: Hyperkeratotic lesion submetatarsal 5 on the right side.  There is no underlying ulceration drainage or any signs of infection noted today.  There is no open lesions.  Vascular: Dorsalis Pedis artery and Posterior Tibial artery pedal pulses are 2/4 bilateral with immedate capillary fill time.  There is no pain with calf compression, swelling, warmth, erythema.   Neruologic: Grossly intact via light touch bilateral.  Musculoskeletal: There is prominence of metatarsal heads plantarly with atrophy of the fat pad.  Hammertoes  present.  Muscular strength 5/5 in all groups tested bilateral.  Gait: Unassisted, Nonantalgic.       Assessment:   Hyperkeratotic lesion, prominent metatarsal head     Plan:  -Treatment options discussed including all alternatives, risks, and complications -Etiology of symptoms were discussed -X-rays were obtained and reviewed with the patient.  No evidence of acute fracture, stress fracture, calcifications, foreign body.  Hammertoes present -Sharply debrided the hyperkeratotic lesion without any complications or bleeding.  I discussed offloading pads, metatarsal pads.  Moisturizer daily.  Discussed shoes and possibly an insert to help prevent the pressure.  Trula Slade DPM

## 2020-11-05 ENCOUNTER — Other Ambulatory Visit: Payer: Self-pay | Admitting: *Deleted

## 2020-11-05 NOTE — Patient Outreach (Signed)
[name]) Care Management  Maxwell Fox Fox  11/05/2020   Maxwell Fox Fox 03-29-1945 [id number]  Subjective: Telephone outreach to follow up on mobility and medication regimen.  Encounter Medications:  Outpatient Encounter Medications as of 11/05/2020  Medication Sig   acetaminophen (TYLENOL) 325 MG tablet Take 2 tablets (650 mg total) by mouth every 6 (six) hours as needed for moderate pain.   calcium carbonate (OS-CAL - DOSED IN MG OF ELEMENTAL CALCIUM) 1250 (500 Ca) MG tablet Take 1 tablet by mouth daily with supper.   Cholecalciferol (VITAMIN D) 2000 UNITS tablet Take 1 tablet (2,000 Units total) by mouth daily.   clonazePAM (KLONOPIN) 1 MG tablet TAKE 1 TABLET(1 MG) BY MOUTH TWICE DAILY AS NEEDED FOR ANXIETY   escitalopram (LEXAPRO) 20 MG tablet Take 1 tablet (20 mg total) by mouth daily.   levothyroxine (SYNTHROID) 88 MCG tablet TAKE 1 TABLET(88 MCG) BY MOUTH DAILY BEFORE BREAKFAST   Multiple Vitamin (MULTIVITAMIN WITH MINERALS) TABS tablet Take 1 tablet by mouth daily.   omeprazole (PRILOSEC) 40 MG capsule TAKE 1 CAPSULE(40 MG) BY MOUTH TWICE DAILY   Oxycodone HCl 10 MG TABS Take 1 tablet (10 mg total) by mouth 2 (two) times daily as needed (chronic pain from pelvic fx).   polyethylene glycol (MIRALAX / GLYCOLAX) 17 g packet Take 17 g by mouth daily as needed for mild constipation or moderate constipation.   predniSONE (DELTASONE) 5 MG tablet TAKE 1 TABLET BY MOUTH EVERY DAY WITH BREAKFAST   tamsulosin (FLOMAX) 0.4 MG CAPS capsule TAKE 1 CAPSULE(0.4 MG) BY MOUTH DAILY AFTER SUPPER   No facility-administered encounter medications on file as of 11/05/2020.    Functional Status:  In your present state of health, do you have any difficulty performing the following activities: 03/27/2020 03/27/2020  Hearing? - N  Vision? - N  Difficulty concentrating or making decisions? - N  Walking or climbing stairs? - Y  Comment - -  Dressing or bathing? - Y  Doing  errands, shopping? Y -  Some recent data might be hidden    Fall/Depression Screening: Fall Risk  07/23/2020 02/09/2020 03/28/2018  Falls in the past year? 0 0 0  Number falls in past yr: 0 0 -  Injury with Fall? 0 1 -  Risk for fall due to : - Impaired balance/gait -  Follow up - Falls evaluation completed -   PHQ 2/9 Scores 07/23/2020 02/09/2020 03/31/2018 03/28/2018 03/28/2018 01/10/2018 01/07/2017  PHQ - 2 Score 0 0 1 0 0 5 0  PHQ- 9 Score - - 3 0 - 12 -    Assessment: Hx closed fracture of L superior pubic ramus  Care Plan   Goals Addressed               This Visit's Progress     Patient Stated     COMPLETED: Extend this goal for additional 2 months Starting 05/08/20: Will work with PT to regain mobility and be able to walk without assistance over the next 2 months. (pt-stated)        03/13/20 Long term goal  Start date 03/13/20 Expected end date 08/01/20, extending goal additional 2 months 11/05/20  Barriers: None  03/13/20 Discussed pt personal goals. He hopes to be able to walk without his walker in 2 months. He will resume PT tomorrow. NP has encouraged him to participate 100% in the therapy and to do the recommended exercises on other days. 04/03/20 Pt has had a set back, another hospitalization  (  5 days) for another GI Bleed. The specialists are hopeful that they found the area of concern and have corrected the problem. He will restart his PT today. He feels the endoscopy positioning which was on the area of the fracture did not help in the healing process. He is very sore. He did talk with his provider about pain medication to help him get through the therapy and she is working with him on that. Encouraged him to give 100% during his PT sessions  and do the recommended exercises all other days. Continue safety practices to avoid falls. 04/10/20 Pt has not had any health occurances for a week and has been able to get back to his PT. This is going well per his report. He still has pain but  expects this will get better as he is able to move more freely. Encouraged exercises on days PT does not come. He has not had any falls. 05/08/20 Pt has completed his in home PT. He is able to walk with a cane in his home and uses a walker if he leaves his home for stability. He denies any falls. He will begin outpt PT sometime this month. We will extend this goal another 2 months so he can reach his initial goal of walking without assistance which he should be able to reach. Reminded of fall precautions. Discussed informing his outpt PT about his hx of injury and set backs, share his fear of being hurt and set back again. Encouraged him to tell PT when he is uncomfortable. 06/12/20 Making progress with PT and home mobility. Using a cane. Did walk from one chair to another in PT this week without assistance but he felt scared. He does his exercises at home. His stride has lengthened. Still requiring pain medication, especially important before and after PT. Suggested using an ice pack when he returns home to help with the pain so he can start weaning down from 4 oxycodone per day. He did have a scan at [facility] which revealed a secondary fx in his pelvis that was originally not recognized after his fall. There is question as to when this occurred. Reinforced safety/fall precautions and continued efforts in PT and home exercise. 06/27/20 Pt is now able to ambulate with or without a cane. He continues to make progress with his PT. He is happy about this and feels that he will meet his goal. Reinforced safety precautions. 09/03/20 Pt has not answered calls in June or July. Was able to talk with him today. He reports he is still working with PT. He had a set back in which his wife fell, he was helping her get up and he fell also and twisted and strained in L knee. He has recovered from this and continues to work on gaining his muscle strength. He thinks he may be finishing with PT this month. He continue to be on oxycodone. He is  being titrated down from 4 a day, now on 3 a day. Dr. Quay Burow is overseeing this process. Encouraged full participation with Pt and med reduction. 11/05/20 Pt is able to walk without assistance in his home! He does always take his cane with him when he goes out. He is no longer in PT at this time. He has been able to reduce his need for oxycodone down to 10 mg bid (from tid). He will see Dr. Quay Burow in Nov.for another reduction. Goal completed!       Other     COMPLETED: Pt  will reseach possibilty of using CBD product over the next 2 months and report to NP on 11/05/20.        Start date: 09/03/20 Priority - low Long term goal Expected end date: 11/05/20 Barriers: Knowledge  11/05/20: Pt did try CBD but he did not feel that it was beneficial. Encouraged him to take it regularly and in combination with APAP between doses of his oxycodone he may be relieved of his breakthrough pain. Goal completed.  09/03/20: Pt having pain for extended period of time and has become dependent on this and titration is difficult. Discussed consideration of CBD products. Provided education. Advised not good evidence for it's use but it would be safe at recommended doses and not interfere with his present prescription medications. Advised of legality in [place] for products. Will provide additional information so that pt can discuss with Dr. Quay Burow and his brothers who are MDs and get their input.          Plan:  Follow-up: Patient agrees to Care Plan and Follow-up. Follow-up in 2 month(s)  Kayleen Memos C. Myrtie Neither, MSN, [facility] Gerontological Nurse Practitioner [facility] Care Management [phone]

## 2020-11-12 DIAGNOSIS — Z87891 Personal history of nicotine dependence: Secondary | ICD-10-CM | POA: Diagnosis not present

## 2020-11-12 DIAGNOSIS — L80 Vitiligo: Secondary | ICD-10-CM | POA: Diagnosis not present

## 2020-11-12 DIAGNOSIS — R590 Localized enlarged lymph nodes: Secondary | ICD-10-CM | POA: Diagnosis not present

## 2020-11-12 DIAGNOSIS — K862 Cyst of pancreas: Secondary | ICD-10-CM | POA: Diagnosis not present

## 2020-11-12 DIAGNOSIS — Z96611 Presence of right artificial shoulder joint: Secondary | ICD-10-CM | POA: Diagnosis not present

## 2020-11-12 DIAGNOSIS — C7931 Secondary malignant neoplasm of brain: Secondary | ICD-10-CM | POA: Diagnosis not present

## 2020-11-12 DIAGNOSIS — Z85830 Personal history of malignant neoplasm of bone: Secondary | ICD-10-CM | POA: Diagnosis not present

## 2020-11-12 DIAGNOSIS — Z08 Encounter for follow-up examination after completed treatment for malignant neoplasm: Secondary | ICD-10-CM | POA: Diagnosis not present

## 2020-11-12 DIAGNOSIS — Z79899 Other long term (current) drug therapy: Secondary | ICD-10-CM | POA: Diagnosis not present

## 2020-11-12 DIAGNOSIS — Z9884 Bariatric surgery status: Secondary | ICD-10-CM | POA: Diagnosis not present

## 2020-11-12 DIAGNOSIS — E039 Hypothyroidism, unspecified: Secondary | ICD-10-CM | POA: Diagnosis not present

## 2020-11-12 DIAGNOSIS — Z9221 Personal history of antineoplastic chemotherapy: Secondary | ICD-10-CM | POA: Diagnosis not present

## 2020-11-12 DIAGNOSIS — C439 Malignant melanoma of skin, unspecified: Secondary | ICD-10-CM | POA: Diagnosis not present

## 2020-11-12 DIAGNOSIS — Z85820 Personal history of malignant melanoma of skin: Secondary | ICD-10-CM | POA: Diagnosis not present

## 2020-11-12 DIAGNOSIS — M549 Dorsalgia, unspecified: Secondary | ICD-10-CM | POA: Diagnosis not present

## 2020-11-12 DIAGNOSIS — E2740 Unspecified adrenocortical insufficiency: Secondary | ICD-10-CM | POA: Diagnosis not present

## 2020-11-12 DIAGNOSIS — G8929 Other chronic pain: Secondary | ICD-10-CM | POA: Diagnosis not present

## 2020-11-12 DIAGNOSIS — Z85841 Personal history of malignant neoplasm of brain: Secondary | ICD-10-CM | POA: Diagnosis not present

## 2020-11-14 NOTE — Telephone Encounter (Signed)
Pt last seen 04/2020 in cardiology for sinus bradycardia and aortic stenosis.  CT chest abd pelivs at [facility] on 11/12/20.   Available in [place].  Will forward to Dr. Angelena Form to review  - Heart and Pericardium: Normal heart size.  No pericardial effusion.  Severe calcified atherosclerotic plaque in the coronary arteries.   - Abdominal and Pelvic Vasculature: No abdominal aortic aneurysm. Severe  atherosclerotic plaque

## 2020-11-15 ENCOUNTER — Encounter: Payer: Self-pay | Admitting: Internal Medicine

## 2020-11-15 DIAGNOSIS — S32512D Fracture of superior rim of left pubis, subsequent encounter for fracture with routine healing: Secondary | ICD-10-CM

## 2020-11-15 DIAGNOSIS — G893 Neoplasm related pain (acute) (chronic): Secondary | ICD-10-CM

## 2020-11-17 MED ORDER — OXYCODONE HCL 10 MG PO TABS: ORAL_TABLET | ORAL | 0 refills | Status: DC

## 2020-11-19 ENCOUNTER — Other Ambulatory Visit: Payer: Self-pay | Admitting: Internal Medicine

## 2020-11-27 DIAGNOSIS — C439 Malignant melanoma of skin, unspecified: Secondary | ICD-10-CM | POA: Diagnosis not present

## 2020-11-27 DIAGNOSIS — C7931 Secondary malignant neoplasm of brain: Secondary | ICD-10-CM | POA: Diagnosis not present

## 2020-11-27 DIAGNOSIS — R590 Localized enlarged lymph nodes: Secondary | ICD-10-CM | POA: Diagnosis not present

## 2020-11-28 ENCOUNTER — Telehealth: Payer: Self-pay | Admitting: Internal Medicine

## 2020-11-28 NOTE — Telephone Encounter (Signed)
Left message for patient to call me back at [phone] to schedule Medicare Annual Wellness Visit   Last AWV  01/07/17  Please schedule at anytime with [facility] if patient calls the office back.   40 Minutes appointment   Any questions, please call me at [phone]

## 2020-11-30 ENCOUNTER — Other Ambulatory Visit: Payer: Self-pay | Admitting: Internal Medicine

## 2020-12-02 DIAGNOSIS — C439 Malignant melanoma of skin, unspecified: Secondary | ICD-10-CM | POA: Diagnosis not present

## 2020-12-02 DIAGNOSIS — C773 Secondary and unspecified malignant neoplasm of axilla and upper limb lymph nodes: Secondary | ICD-10-CM | POA: Diagnosis not present

## 2020-12-02 DIAGNOSIS — C7931 Secondary malignant neoplasm of brain: Secondary | ICD-10-CM | POA: Diagnosis not present

## 2020-12-02 DIAGNOSIS — R222 Localized swelling, mass and lump, trunk: Secondary | ICD-10-CM | POA: Diagnosis not present

## 2020-12-03 DIAGNOSIS — Z23 Encounter for immunization: Secondary | ICD-10-CM | POA: Diagnosis not present

## 2020-12-06 ENCOUNTER — Other Ambulatory Visit: Payer: Self-pay | Admitting: Internal Medicine

## 2020-12-09 NOTE — Patient Instructions (Addendum)
    Medications changes include :   none  Your prescription(s) have been submitted to your pharmacy. Please take as directed and contact our office if you believe you are having problem(s) with the medication(s).    Please followup in 3 months

## 2020-12-09 NOTE — Progress Notes (Signed)
Subjective:    Patient ID: Maxwell Fox, male    DOB: [birth year], 75 y.o.   MRN: [id number]  This visit occurred during the SARS-CoV-2 public health emergency.  Safety protocols were in place, including screening questions prior to the visit, additional usage of staff PPE, and extensive cleaning of exam room while observing appropriate contact time as indicated for disinfecting solutions.     HPI The patient is here for follow up of their chronic medical problems, including chronic pain syndrome, anxiety, depression, BPH  His melenoma is back - in his left lower bone leg and right axilla.   He goes back to [facility] oncology on the 14th of this month to discuss treatment-most likely will be immunotherapy.  He is very anxious about this.  He has had difficulty decreasing the pain medication-this does cause some stress to him.  He continues to have some chronic pain.  He wonders if we can possibly hold off on decreasing further at this time  Doing peddlar.    Medications and allergies reviewed with patient and updated if appropriate.  Patient Active Problem List   Diagnosis Date Noted   Chronic pain syndrome 08/24/2020   Gastric foreign body    Abnormal x-ray of small bowel    Acute gastric ulcer without hemorrhage or perforation    History of Roux-en-Y gastric bypass    Hemorrhagic shock (HCC)    Acute gastric ulcer with hemorrhage    Acute GI bleeding 02/14/2020   Acute blood loss anemia 02/14/2020   GI bleed 02/14/2020   Melena    Marginal ulcer    Gastroesophageal reflux disease with esophagitis without hemorrhage    Closed fracture of left superior pubic ramus ([place]) 02/09/2020   Anxiety and depression 01/10/2018   History of antineoplastic chemotherapy 07/19/2017   Chronic midline low back pain without sciatica 04/19/2017   Adrenal insufficiency due to cancer therapy ([place]) 07/27/2016   Secondary hypothyroidism 07/27/2016   H/O gastric bypass 01/06/2016   Pancreatic  cyst 01/06/2016   Melanoma metastatic to brain ([place]) 12/31/2015   Iron deficiency anemia    B12 nutritional deficiency    Benign prostatic hyperplasia     Current Outpatient Medications on File Prior to Visit  Medication Sig Dispense Refill   acetaminophen (TYLENOL) 325 MG tablet Take 2 tablets (650 mg total) by mouth every 6 (six) hours as needed for moderate pain.     calcium carbonate (OS-CAL - DOSED IN MG OF ELEMENTAL CALCIUM) 1250 (500 Ca) MG tablet Take 1 tablet by mouth daily with supper.     Cholecalciferol (VITAMIN D) 2000 UNITS tablet Take 1 tablet (2,000 Units total) by mouth daily. 30 tablet 11   clonazePAM (KLONOPIN) 1 MG tablet TAKE 1 TABLET(1 MG) BY MOUTH TWICE DAILY AS NEEDED FOR ANXIETY 60 tablet 2   levothyroxine (SYNTHROID) 88 MCG tablet TAKE 1 TABLET(88 MCG) BY MOUTH DAILY BEFORE BREAKFAST 90 tablet 0   Multiple Vitamin (MULTIVITAMIN WITH MINERALS) TABS tablet Take 1 tablet by mouth daily.     omeprazole (PRILOSEC) 40 MG capsule TAKE 1 CAPSULE(40 MG) BY MOUTH TWICE DAILY 180 capsule 0   Oxycodone HCl 10 MG TABS Take 10 mg in the morning and 5 mg in the evening 45 tablet 0   polyethylene glycol (MIRALAX / GLYCOLAX) 17 g packet Take 17 g by mouth daily as needed for mild constipation or moderate constipation. 14 each 0   predniSONE (DELTASONE) 5 MG tablet TAKE 1 TABLET BY  MOUTH EVERY DAY WITH BREAKFAST 90 tablet 0   tamsulosin (FLOMAX) 0.4 MG CAPS capsule TAKE 1 CAPSULE(0.4 MG) BY MOUTH DAILY AFTER AND SUPPER 90 capsule 1   No current facility-administered medications on file prior to visit.    Past Medical History:  Diagnosis Date   B12 nutritional deficiency    malabsorbtion   Blood transfusion without reported diagnosis 2022   BPH (benign prostatic hypertrophy)    Brain cancer ([place]) 2017   removed "golf ball sized mass"   Depression    on meds   Diverticulosis    GERD (gastroesophageal reflux disease)    on meds   GI bleeding 02/2020   Hypertension    Iron  deficiency anemia    malabsorption related (s/p gastric bypass)   Melanoma (HCC)    Obesity    s/p gastric bypass 2000, start 355#   Pancreatic cyst    Pulmonary nodules    Thyroid disease    on meds    Past Surgical History:  Procedure Laterality Date   CHOLECYSTECTOMY  2000   COLONOSCOPY WITH PROPOFOL N/A 03/05/2020   Procedure: COLONOSCOPY WITH PROPOFOL;  Surgeon: Irving Copas., MD;  Location: [facility] ENDOSCOPY;  Service: Gastroenterology;  Laterality: N/A;   CRANIOTOMY FOR TUMOR  12/11/2015   metastatic melanoma   ENTEROSCOPY N/A 03/30/2020   Procedure: ENTEROSCOPY;  Surgeon: Irene Shipper, MD;  Location: [facility] ENDOSCOPY;  Service: Endoscopy;  Laterality: N/A;   ESOPHAGOGASTRODUODENOSCOPY N/A 03/30/2020   Procedure: ESOPHAGOGASTRODUODENOSCOPY (EGD);  Surgeon: Irene Shipper, MD;  Location: [facility] ENDOSCOPY;  Service: Endoscopy;  Laterality: N/A;   ESOPHAGOGASTRODUODENOSCOPY (EGD) WITH PROPOFOL N/A 02/14/2020   Procedure: ESOPHAGOGASTRODUODENOSCOPY (EGD) WITH PROPOFOL;  Surgeon: Lavena Bullion, DO;  Location: [facility] ENDOSCOPY;  Service: Endoscopy;  Laterality: N/A;   ESOPHAGOGASTRODUODENOSCOPY (EGD) WITH PROPOFOL N/A 02/26/2020   Procedure: ESOPHAGOGASTRODUODENOSCOPY (EGD) WITH PROPOFOL;  Surgeon: Irene Shipper, MD;  Location: [facility] ENDOSCOPY;  Service: Endoscopy;  Laterality: N/A;   ESOPHAGOGASTRODUODENOSCOPY (EGD) WITH PROPOFOL N/A 03/03/2020   Procedure: ESOPHAGOGASTRODUODENOSCOPY (EGD) WITH PROPOFOL;  Surgeon: Carol Ada, MD;  Location: [place];  Service: Endoscopy;  Laterality: N/A;   ESOPHAGOGASTRODUODENOSCOPY (EGD) WITH PROPOFOL N/A 03/28/2020   Procedure: ESOPHAGOGASTRODUODENOSCOPY (EGD) WITH PROPOFOL;  Surgeon: Irene Shipper, MD;  Location: [facility] ENDOSCOPY;  Service: Endoscopy;  Laterality: N/A;   FOREIGN BODY REMOVAL  03/30/2020   Procedure: FOREIGN BODY REMOVAL;  Surgeon: Irene Shipper, MD;  Location: [facility] ENDOSCOPY;  Service: Endoscopy;;   GIVENS CAPSULE STUDY N/A 03/28/2020    Procedure: GIVENS CAPSULE STUDY;  Surgeon: Irene Shipper, MD;  Location: [facility] ENDOSCOPY;  Service: Endoscopy;  Laterality: N/A;   HEMOSTASIS CONTROL  02/26/2020   Procedure: HEMOSTASIS CONTROL;  Surgeon: Irene Shipper, MD;  Location[facility] ENDOSCOPY;  Service: Endoscopy;;   HEMOSTASIS CONTROL  03/30/2020   Procedure: HEMOSTASIS CONTROL;  Surgeon: Irene Shipper, MD;  Location: [facility] ENDOSCOPY;  Service: Endoscopy;;   HOT HEMOSTASIS N/A 02/26/2020   Procedure: HOT HEMOSTASIS (ARGON PLASMA COAGULATION/BICAP);  Surgeon: Irene Shipper, MD;  Location: [facility] ENDOSCOPY;  Service: Endoscopy;  Laterality: N/A;   MOHS SURGERY  2012   melanoma, mid back, [place]   TOTAL SHOULDER REPLACEMENT Right 2011   [place]   WISDOM TOOTH EXTRACTION      Social History   Socioeconomic History   Marital status: Married    Spouse name: Not on file   Number of children: 2   Years of education: Not  on file   Highest education level: Not on file  Occupational History   Occupation: general contractor-retired  Tobacco Use   Smoking status: Former    Types: Cigarettes    Quit date: 02/03/1963    Years since quitting: 57.8   Smokeless tobacco: Never  Vaping Use   Vaping Use: Never used  Substance and Sexual Activity   Alcohol use: Not Currently    Comment: wine 2x/week   Drug use: Never   Sexual activity: Not on file  Other Topics Concern   Not on file  Social History Narrative   Lives with wife   Retired - Conservator, 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 [employer]   Social Determinants of Radio broadcast assistant Strain: Low Risk    Difficulty of Paying Living Expenses: Not hard at [employer] Insecurity: No Food Insecurity   Worried About Charity fundraiser in the Last Year: Never true   Arboriculturist in the Last Year: Never true  Transportation Needs: No Transportation Needs   Lack of Transportation (Medical): No   Lack of Transportation (Non-Medical): No  Physical  Activity: Sufficiently Active   Days of Exercise per Week: 5 days   Minutes of Exercise per Session: 30 min  Stress: No Stress Concern Present   Feeling of Stress : Not at all  Social Connections: Socially Integrated   Frequency of Communication with Friends and Family: More than three times a week   Frequency of Social Gatherings with Friends and Family: More than three times a week   Attends Religious Services: More than 4 times per year   Active Member of [employer] or Organizations: Yes   Attends Music therapist: More than 4 times per year   Marital Status: Married    Family History  Problem Relation Age of Onset   Hypertension Father    Macular degeneration Mother    Melanoma Mother    Melanoma Sister    Stomach cancer Sister 31       duodenal   Breast cancer Sister    Uterine cancer Sister    Melanoma Brother    Colon polyps Neg Hx    Colon cancer Neg Hx    Esophageal cancer Neg Hx    Rectal cancer Neg Hx     Review of Systems  Constitutional:  Negative for fever.  Respiratory:  Negative for cough, shortness of breath and wheezing.   Cardiovascular:  Negative for chest pain, palpitations and leg swelling.  Neurological:  Negative for light-headedness, numbness and headaches.      Objective:   Vitals:   12/10/20 1012  BP: 116/80  Pulse: 71  Temp: 97.9 F (36.6 C)  SpO2: 98%   BP Readings from Last 3 Encounters:  12/10/20 116/80  12/10/20 116/80  10/08/20 120/68   Wt Readings from Last 3 Encounters:  12/10/20 180 lb 3.2 oz (81.7 kg)  12/10/20 180 lb 3.2 oz (81.7 kg)  10/08/20 182 lb (82.6 kg)   Body mass index is 25.13 kg/m.   Physical Exam    Constitutional: Appears well-developed and well-nourished. No distress.  HENT:  Head: Normocephalic and atraumatic.  Neck: Neck supple. No tracheal deviation present. No thyromegaly present.  No cervical lymphadenopathy Cardiovascular: Normal rate, regular rhythm and normal heart sounds.   No  murmur heard. No carotid bruit .  No edema Pulmonary/Chest: Effort normal and breath sounds normal. No respiratory distress. No has no wheezes. No  rales.  Skin: Skin is warm and dry. Not diaphoretic.  Psychiatric: Normal mood and affect. Behavior is normal.      Assessment & Plan:    See Problem List for Assessment and Plan of chronic medical problems.

## 2020-12-10 ENCOUNTER — Encounter: Payer: Self-pay | Admitting: Internal Medicine

## 2020-12-10 ENCOUNTER — Ambulatory Visit (INDEPENDENT_AMBULATORY_CARE_PROVIDER_SITE_OTHER): Payer: Medicare Other | Admitting: Internal Medicine

## 2020-12-10 ENCOUNTER — Other Ambulatory Visit: Payer: Self-pay

## 2020-12-10 ENCOUNTER — Ambulatory Visit (INDEPENDENT_AMBULATORY_CARE_PROVIDER_SITE_OTHER): Payer: Medicare Other

## 2020-12-10 VITALS — BP 116/80 | HR 71 | Temp 97.9°F | Ht 71.0 in | Wt 180.2 lb

## 2020-12-10 VITALS — BP 116/80 | HR 62 | Temp 97.9°F | Ht 71.0 in | Wt 180.2 lb

## 2020-12-10 DIAGNOSIS — G894 Chronic pain syndrome: Secondary | ICD-10-CM | POA: Diagnosis not present

## 2020-12-10 DIAGNOSIS — N400 Enlarged prostate without lower urinary tract symptoms: Secondary | ICD-10-CM

## 2020-12-10 DIAGNOSIS — C779 Secondary and unspecified malignant neoplasm of lymph node, unspecified: Secondary | ICD-10-CM

## 2020-12-10 DIAGNOSIS — Z0000 Encounter for general adult medical examination without abnormal findings: Secondary | ICD-10-CM | POA: Diagnosis not present

## 2020-12-10 DIAGNOSIS — F419 Anxiety disorder, unspecified: Secondary | ICD-10-CM

## 2020-12-10 DIAGNOSIS — F32A Depression, unspecified: Secondary | ICD-10-CM | POA: Diagnosis not present

## 2020-12-10 MED ORDER — ESCITALOPRAM OXALATE 20 MG PO TABS: 20.0000 mg | ORAL_TABLET | Freq: Every day | ORAL | 1 refills | Status: DC

## 2020-12-10 NOTE — Assessment & Plan Note (Signed)
Chronic Controlled, Stable Continue Lexapro 20 mg daily, clonazepam 1 mg twice daily

## 2020-12-10 NOTE — Assessment & Plan Note (Signed)
Chronic Still with chronic pain Has been on opioids for a long time and we are slowly decreasing his dose We will continue the current dose for the next month-10 mg in the morning and 5 mg at night or 5 mg 3 times daily After the next refill will consider decreasing further He will follow-up with me in 3 months, sooner if needed

## 2020-12-10 NOTE — Progress Notes (Signed)
Subjective:   Maxwell Fox is a 75 y.o. male who presents for Medicare Annual/Subsequent preventive examination.  Review of Systems     Cardiac Risk Factors include: advanced age (>70men, >53 women);family history of premature cardiovascular disease;male gender     Objective:    Today's Vitals   12/10/20 0919  BP: 116/80  Pulse: 62  Temp: 97.9 F (36.6 C)  SpO2: 98%  Weight: 180 lb 3.2 oz (81.7 kg)  Height: 5\' 11"  (1.803 m)  PainSc: 0-No pain   Body mass index is 25.13 kg/m.  Advanced Directives 12/10/2020 03/27/2020 03/03/2020 03/03/2020 02/23/2020 02/16/2020 01/17/2020  Does Patient Have a Medical Advance Directive? No No No No No No No  Would patient like information on creating a medical advance directive? No - Patient declined No - Patient declined Yes (Inpatient - patient requests chaplain consult to create a medical advance directive) No - Patient declined Yes (MAU/Ambulatory/Procedural Areas - Information given) No - Patient declined No - Patient declined    Current Medications (verified) Outpatient Encounter Medications as of 12/10/2020  Medication Sig   acetaminophen (TYLENOL) 325 MG tablet Take 2 tablets (650 mg total) by mouth every 6 (six) hours as needed for moderate pain.   calcium carbonate (OS-CAL - DOSED IN MG OF ELEMENTAL CALCIUM) 1250 (500 Ca) MG tablet Take 1 tablet by mouth daily with supper.   Cholecalciferol (VITAMIN D) 2000 UNITS tablet Take 1 tablet (2,000 Units total) by mouth daily.   clonazePAM (KLONOPIN) 1 MG tablet TAKE 1 TABLET(1 MG) BY MOUTH TWICE DAILY AS NEEDED FOR ANXIETY   escitalopram (LEXAPRO) 20 MG tablet Take 1 tablet (20 mg total) by mouth daily.   levothyroxine (SYNTHROID) 88 MCG tablet TAKE 1 TABLET(88 MCG) BY MOUTH DAILY BEFORE BREAKFAST   Multiple Vitamin (MULTIVITAMIN WITH MINERALS) TABS tablet Take 1 tablet by mouth daily.   omeprazole (PRILOSEC) 40 MG capsule TAKE 1 CAPSULE(40 MG) BY MOUTH TWICE DAILY   Oxycodone HCl 10 MG TABS  Take 10 mg in the morning and 5 mg in the evening   polyethylene glycol (MIRALAX / GLYCOLAX) 17 g packet Take 17 g by mouth daily as needed for mild constipation or moderate constipation.   predniSONE (DELTASONE) 5 MG tablet TAKE 1 TABLET BY MOUTH EVERY DAY WITH BREAKFAST   tamsulosin (FLOMAX) 0.4 MG CAPS capsule TAKE 1 CAPSULE(0.4 MG) BY MOUTH DAILY AFTER AND SUPPER   No facility-administered encounter medications on file as of 12/10/2020.    Allergies (verified) Patient has no known allergies.   History: Past Medical History:  Diagnosis Date   B12 nutritional deficiency    malabsorbtion   Blood transfusion without reported diagnosis 2022   BPH (benign prostatic hypertrophy)    Brain cancer ([place]) 2017   removed "golf ball sized mass"   Depression    on meds   Diverticulosis    GERD (gastroesophageal reflux disease)    on meds   GI bleeding 02/2020   Hypertension    Iron deficiency anemia    malabsorption related (s/p gastric bypass)   Melanoma (HCC)    Obesity    s/p gastric bypass 2000, start 355#   Pancreatic cyst    Pulmonary nodules    Thyroid disease    on meds   Past Surgical History:  Procedure Laterality Date   CHOLECYSTECTOMY  2000   COLONOSCOPY WITH PROPOFOL N/A 03/05/2020   Procedure: COLONOSCOPY WITH PROPOFOL;  Surgeon: Irving Copas., MD;  [facility] END[facility]COPY;  Service: Gastroenterology;  Laterality: N/A;   CRANIOTOMY FOR TUMOR  12/11/2015   metastatic melanoma   ENTEROSCOPY N/A 03/30/2020   Procedure: ENTEROSCOPY;  Surgeon: Irene Shipper, MD;  Location: [facility] ENDOSCOPY;  Service: Endoscopy;  Laterality: N/A;   ESOPHAGOGASTRODUODENOSCOPY N/A 03/30/2020   Procedure: ESOPHAGOGASTRODUODENOSCOPY (EGD);  Surgeon: Irene Shipper, MD;  Location: [facility] ENDOSCOPY;  Service: Endoscopy;  Laterality: N/A;   ESOPHAGOGASTRODUODENOSCOPY (EGD) WITH PROPOFOL N/A 02/14/2020   Procedure: ESOPHAGOGASTRODUODENOSCOPY (EGD) WITH PROPOFOL;  Surgeon: Lavena Bullion, DO;   Location: [facility] ENDOSCOPY;  Service: Endoscopy;  Laterality: N/A;   ESOPHAGOGASTRODUODENOSCOPY (EGD) WITH PROPOFOL N/A 02/26/2020   Procedure: ESOPHAGOGASTRODUODENOSCOPY (EGD) WITH PROPOFOL;  Surgeon: Irene Shipper, MD;  Location[facility] ENDOSCOPY;  Service: Endoscopy;  Laterality: N/A;   ESOPHAGOGASTRODUODENOSCOPY (EGD) WITH PROPOFOL N/A 03/03/2020   Procedure: ESOPHAGOGASTRODUODENOSCOPY (EGD) WITH PROPOFOL;  Surgeon: Carol Ada, MD;  Location: [facility];  Service: Endoscopy;  Laterality: N/A;   ESOPHAGOGASTRODUODENOSCOPY (EGD) WITH PROPOFOL N/A 03/28/2020   Procedure: ESOPHAGOGASTRODUODENOSCOPY (EGD) WITH PROPOFOL;  Surgeon: Irene Shipper, MD;  Location: [facility] ENDOSCOPY;  Service: Endoscopy;  Laterality: N/A;   FOREIGN BODY REMOVAL  03/30/2020   Procedure: FOREIGN BODY REMOVAL;  Surgeon: Irene Shipper, MD;  Location: [facility] ENDOSCOPY;  Service: Endoscopy;;   GIVENS CAPSULE STUDY N/A 03/28/2020   Procedure: GIVENS CAPSULE STUDY;  Surgeon: Irene Shipper, MD;  Location: [facility] ENDOSCOPY;  Service: Endoscopy;  Laterality: N/A;   HEMOSTASIS CONTROL  02/26/2020   Procedure: HEMOSTASIS CONTROL;  Surgeon: Irene Shipper, MD;  Location: [facility] ENDOSCOPY;  Service: Endoscopy;;   HEMOSTASIS CONTROL  03/30/2020   Procedure: HEMOSTASIS CONTROL;  Surgeon: Irene Shipper, MD;  Location: [facility] ENDOSCOPY;  Service: Endoscopy;;   HOT HEMOSTASIS N/A 02/26/2020   Procedure: HOT HEMOSTASIS (ARGON PLASMA COAGULATION/BICAP);  Surgeon: Irene Shipper, MD;  Location: [facility] ENDOSCOPY;  Service: Endoscopy;  Laterality: N/A;   MOHS SURGERY  2012   melanoma, mid back, [place]   ROUX-EN-Y PROCEDURE  2000   TOTAL SHOULDER REPLACEMENT Right 2011   [place]   WISDOM TOOTH EXTRACTION     Family History  Problem Relation Age of Onset   Hypertension Father    Macular degeneration Mother    Melanoma Mother    Melanoma Sister    Stomach cancer Sister 83       duodenal   Breast cancer Sister    Uterine cancer Sister    Melanoma Brother    Colon  polyps Neg Hx    Colon cancer Neg Hx    Esophageal cancer Neg Hx    Rectal cancer Neg Hx    Social History   Socioeconomic History   Marital status: Married    Spouse name: Not on file   Number of children: 2   Years of education: Not on file   Highest education level: Not on file  Occupational History   Occupation: general contractor-retired  Tobacco Use   Smoking status: Former    Types: Cigarettes    Quit date: 02/03/1963    Years since quitting: 57.8   Smokeless tobacco: Never  Vaping Use   Vaping Use: Never used  Substance and Sexual Activity   Alcohol use: Not Currently    Comment: wine 2x/week   Drug use: Never   Sexual activity: Not on file  Other Topics Concern   Not on file  Social History Narrative   Lives with wife   Retired - Conservator, 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 [employer]  Social Determinants of Health   Financial Resource Strain: Low Risk    Difficulty of Paying Living Expenses: Not hard at all  Food Insecurity: No Food Insecurity   Worried About Charity fundraiser in the Last Year: Never true   [place] in the Last Year: Never true  Transportation Needs: No Transportation Needs   Lack of Transportation (Medical): No   Lack of Transportation (Non-Medical): No  Physical Activity: Sufficiently Active   Days of Exercise per Week: 5 days   Minutes of Exercise per Session: 30 min  Stress: No Stress Concern Present   Feeling of Stress : Not at all  Social Connections: Socially Integrated   Frequency of Communication with Friends and Family: More than three times a week   Frequency of Social Gatherings with Friends and Family: More than three times a week   Attends Religious Services: More than 4 times per year   Active Member of [employer] or Organizations: Yes   Attends Music therapist: More than 4 times per year   Marital Status: Married    Tobacco Counseling Counseling given: Not Answered   Clinical  Intake:  Pre-visit preparation completed: Yes  Pain : No/denies pain Pain Score: 0-No pain     BMI - recorded: 25.13 Nutritional Status: BMI 25 -29 Overweight Nutritional Risks: None Diabetes: No  How often do you need to have someone help you when you read instructions, pamphlets, or other written materials from your doctor or pharmacy?: 1 - Never What is the last grade level you completed in school?: 1.5 years of college courses  Diabetic? no  Interpreter Needed?: No  Information entered by :: Lisette Abu, LPN   Activities of Daily Living In your present state of health, do you have any difficulty performing the following activities: 12/10/2020 03/27/2020  Hearing? N -  Vision? N -  Difficulty concentrating or making decisions? N -  Walking or climbing stairs? N -  Dressing or bathing? N -  Doing errands, shopping? N Y  Conservation officer, nature and eating ? N -  Using the Toilet? N -  In the past six months, have you accidently leaked urine? N -  Do you have problems with loss of bowel control? N -  Managing your Medications? N -  Managing your Finances? N -  Housekeeping or managing your Housekeeping? N -  Some recent data might be hidden    Patient Care Team: [name], MD as PCP - General (Internal Medicine) Burnell Blanks, MD as PCP - Cardiology (Cardiology) Deloria Lair, NP as [place] Management Luberta Mutter, MD as Consulting Physician (Ophthalmology)  Indicate any recent [place] you may have received from other than [facility] providers in the past year (date may be approximate).     Assessment:   This is a routine wellness examination for [place].  Hearing/Vision screen Hearing Screening - Comments:: Patient denied any hearing difficulty.   No hearing aids.  Vision Screening - Comments:: Patient wears corrective glasses/contacts.  Eye exam done annually by: Luberta Mutter, MD.  Dietary issues and exercise  activities discussed: Current Exercise Habits: Home exercise routine, Type of exercise: walking;Other - see comments Kristeen Mans), Time (Minutes): 30, Frequency (Times/Week): 5, Weekly Exercise (Minutes/Week): 150, Intensity: Mild, Exercise limited by: orthopedic condition(s)   Goals Addressed               This Visit's Progress     Patient Stated (pt-stated)  My goal is to be cancer free.      Depression Screen PHQ 2/9 Scores 12/10/2020 07/23/2020 02/09/2020 03/31/2018 03/28/2018 03/28/2018 01/10/2018  PHQ - 2 Score 0 0 0 1 0 0 5  PHQ- 9 Score - - - 3 0 - 12    Fall Risk Fall Risk  12/10/2020 07/23/2020 02/09/2020 03/28/2018 01/10/2018  Falls in the past year? 1 0 0 0 0  Number falls in past yr: 1 0 0 - 0  Injury with Fall? 1 0 1 - -  Risk for fall due to : - - Impaired balance/gait - -  Follow up Falls evaluation completed - Falls evaluation completed - -    FALL RISK PREVENTION PERTAINING TO THE HOME:  Any stairs in or around the home? No  If so, are there any without handrails? No  Home free of loose throw rugs in walkways, pet beds, electrical cords, etc? Yes  Adequate lighting in your home to reduce risk of falls? Yes   ASSISTIVE DEVICES UTILIZED TO PREVENT FALLS:  Life alert? No  Use of a cane, walker or w/c? No  Grab bars in the bathroom? Yes  Shower chair or bench in shower? Yes  Elevated toilet seat or a handicapped toilet? Yes   TIMED UP AND GO:  Was the test performed? Yes .  Length of time to ambulate 10 feet: 7 sec.   Gait steady and fast without use of assistive device  Cognitive Function: Normal cognitive status assessed by direct observation by this Nurse Health Advisor. No abnormalities found.          Immunizations Immunization History  Administered Date(s) Administered   Fluad Quad(high Dose 65+) 11/12/2018, 10/08/2020   Influenza, High Dose Seasonal PF 11/08/2017   Influenza,inj,Quad PF,6+ Mos 10/26/2013, 11/01/2014   Influenza-Unspecified  10/26/2013, 11/01/2014, 01/02/2017   PFIZER(Purple Top)SARS-COV-2 Vaccination 03/31/2019, 04/28/2019, 11/13/2019, 05/13/2020   Pfizer Covid-19 Vaccine Bivalent Booster 66yrs & up 12/03/2020   Pneumococcal Conjugate-13 01/10/2018   Pneumococcal Polysaccharide-23 10/26/2013    TDAP status: Due, Education has been provided regarding the importance of this vaccine. Advised may receive this vaccine at local pharmacy or Health Dept. Aware to provide a copy of the vaccination record if obtained from local pharmacy or Health Dept. Verbalized acceptance and understanding.  Flu Vaccine status: Up to date  Pneumococcal vaccine status: Up to date  Covid-19 vaccine status: Completed vaccines  Qualifies for Shingles Vaccine? Yes   Zostavax completed No   Shingrix Completed?: No.    Education has been provided regarding the importance of this vaccine. Patient has been advised to call insurance company to determine out of pocket expense if they have not yet received this vaccine. Advised may also receive vaccine at local pharmacy or Health Dept. Verbalized acceptance and understanding.  Screening Tests Health Maintenance  Topic Date Due   Zoster Vaccines- Shingrix (1 of 2) Never done   TETANUS/TDAP  07/23/2021 (Originally 04/02/1964)   COLONOSCOPY (Pts 45-20yrs Insurance coverage will need to be confirmed)  03/05/2030   Pneumonia Vaccine 4+ Years old  Completed   INFLUENZA VACCINE  Completed   COVID-19 Vaccine  Completed   Hepatitis C Screening  Completed   HPV VACCINES  Aged Out    Health Maintenance  Health Maintenance Due  Topic Date Due   Zoster Vaccines- Shingrix (1 of 2) Never done    Colorectal cancer screening: Type of screening: Colonoscopy. Completed 2/1/222. Repeat every 0 years (No longer recommended)  Lung Cancer Screening: (Low  Dose CT Chest recommended if Age 93-80 years, 30 pack-year currently smoking OR have quit w/in 15years.) does not qualify.   Lung Cancer Screening  Referral: no  Additional Screening:  Hepatitis C Screening: does qualify; Completed: yes  Vision Screening: Recommended annual ophthalmology exams for early detection of glaucoma and other disorders of the eye. Is the patient up to date with their annual eye exam?  Yes  Who is the provider or what is the name of the office in which the patient attends annual eye exams? Luberta Mutter, MD. If pt is not established with a provider, would they like to be referred to a provider to establish care? No .   Dental Screening: Recommended annual dental exams for proper oral hygiene  Community Resource Referral / Chronic Care Management: CRR required this visit?  No   CCM required this visit?  No      Plan:     I have personally reviewed and noted the following in the patient's chart:   Medical and social history Use of alcohol, tobacco or illicit drugs  Current medications and supplements including opioid prescriptions. Patient is currently taking opioid prescriptions. Information provided to patient regarding non-opioid alternatives. Patient advised to discuss non-opioid treatment plan with their provider. Functional ability and status Nutritional status Physical activity Advanced directives List of other physicians Hospitalizations, surgeries, and ER visits in previous 12 months Vitals Screenings to include cognitive, depression, and falls Referrals and appointments  In addition, I have reviewed and discussed with patient certain preventive protocols, quality metrics, and best practice recommendations. A written personalized care plan for preventive services as well as general preventive health recommendations were provided to patient.     Sheral Flow, LPN   [id number]   Nurse Notes:  Hearing Screening - Comments:: Patient denied any hearing difficulty.   No hearing aids.  Vision Screening - Comments:: Patient wears corrective glasses/contacts.  Eye exam done annually  by: Luberta Mutter, MD.

## 2020-12-10 NOTE — Assessment & Plan Note (Signed)
Chronic He found out recently that his cancer has recurred Melanoma metastatic to right axillary lymph node and left tibia To follow-up with oncology next week to discuss treatment-most likely will need to go back on immunotherapy

## 2020-12-10 NOTE — Patient Instructions (Signed)
Maxwell Fox , Thank you for taking time to come for your Medicare Wellness Visit. I appreciate your ongoing commitment to your health goals. Please review the following plan we discussed and let me know if I can assist you in the future.   Screening recommendations/referrals: Colonoscopy: Not a candidate for screening due to age Recommended yearly ophthalmology/optometry visit for glaucoma screening and checkup Recommended yearly dental visit for hygiene and checkup  Vaccinations: Influenza vaccine: 10/08/2020 Pneumococcal vaccine: 10/26/2013, 01/10/2018 Tdap vaccine: never done Shingles vaccine: never done; please check with insurance after 02/02/2021 for coverage.   Covid-19: 03/31/2019, 04/28/2019, 11/13/2019, 05/13/2020, 12/03/2020  Advanced directives: Advance directive discussed with you today. Even though you declined this today please call our office should you change your mind and we can give you the proper paperwork for you to fill out.  Conditions/risks identified: Yes; Client understands the importance of follow-up with providers by attending scheduled visits and discussed goals to eat healthier, increase physical activity, exercise the brain, socialize more, get enough sleep and make time for laughter.  Next appointment: Please schedule your next Medicare Wellness Visit with your Nurse Health Advisor in 1 year by calling [phone].  Preventive Care 24 Years and Older, Male Preventive care refers to lifestyle choices and visits with your health care provider that can promote health and wellness. What does preventive care include? A yearly physical exam. This is also called an annual well check. Dental exams once or twice a year. Routine eye exams. Ask your health care provider how often you should have your eyes checked. Personal lifestyle choices, including: Daily care of your teeth and gums. Regular physical activity. Eating a healthy diet. Avoiding tobacco and drug  use. Limiting alcohol use. Practicing safe sex. Taking low doses of aspirin every day. Taking vitamin and mineral supplements as recommended by your health care provider. What happens during an annual well check? The services and screenings done by your health care provider during your annual well check will depend on your age, overall health, lifestyle risk factors, and family history of disease. Counseling  Your health care provider may ask you questions about your: Alcohol use. Tobacco use. Drug use. Emotional well-being. Home and relationship well-being. Sexual activity. Eating habits. History of falls. Memory and ability to understand (cognition). Work and work Statistician. Screening  You may have the following tests or measurements: Height, weight, and BMI. Blood pressure. Lipid and cholesterol levels. These may be checked every 5 years, or more frequently if you are over 31 years old. Skin check. Lung cancer screening. You may have this screening every year starting at age 28 if you have a 30-pack-year history of smoking and currently smoke or have quit within the past 15 years. Fecal occult blood test (FOBT) of the stool. You may have this test every year starting at age 55. Flexible sigmoidoscopy or colonoscopy. You may have a sigmoidoscopy every 5 years or a colonoscopy every 10 years starting at age 57. Prostate cancer screening. Recommendations will vary depending on your family history and other risks. Hepatitis C blood test. Hepatitis B blood test. Sexually transmitted disease (STD) testing. Diabetes screening. This is done by checking your blood sugar (glucose) after you have not eaten for a while (fasting). You may have this done every 1-3 years. Abdominal aortic aneurysm (AAA) screening. You may need this if you are a current or former smoker. Osteoporosis. You may be screened starting at age 66 if you are at high risk. Talk with your health  care provider about  your test results, treatment options, and if necessary, the need for more tests. Vaccines  Your health care provider may recommend certain vaccines, such as: Influenza vaccine. This is recommended every year. Tetanus, diphtheria, and acellular pertussis (Tdap, Td) vaccine. You may need a Td booster every 10 years. Zoster vaccine. You may need this after age 73. Pneumococcal 13-valent conjugate (PCV13) vaccine. One dose is recommended after age 90. Pneumococcal polysaccharide (PPSV23) vaccine. One dose is recommended after age 39. Talk to your health care provider about which screenings and vaccines you need and how often you need them. This information is not intended to replace advice given to you by your health care provider. Make sure you discuss any questions you have with your health care provider. Document Released: 02/15/2015 Document Revised: 10/09/2015 Document Reviewed: 11/20/2014 Elsevier Interactive Patient Education  2017 [employer] Prevention in the Home Falls can cause injuries. They can happen to people of all ages. There are many things you can do to make your home safe and to help prevent falls. What can I do on the outside of my home? Regularly fix the edges of walkways and driveways and fix any cracks. Remove anything that might make you trip as you walk through a door, such as a raised step or threshold. Trim any bushes or trees on the path to your home. Use bright outdoor lighting. Clear any walking paths of anything that might make someone trip, such as rocks or tools. Regularly check to see if handrails are loose or broken. Make sure that both sides of any steps have handrails. Any raised decks and porches should have guardrails on the edges. Have any leaves, snow, or ice cleared regularly. Use sand or salt on walking paths during winter. Clean up any spills in your garage right away. This includes oil or grease spills. What can I do in the bathroom? Use  night lights. Install grab bars by the toilet and in the tub and shower. Do not use towel bars as grab bars. Use non-skid mats or decals in the tub or shower. If you need to sit down in the shower, use a plastic, non-slip stool. Keep the floor dry. Clean up any water that spills on the floor as soon as it happens. Remove soap buildup in the tub or shower regularly. Attach bath mats securely with double-sided non-slip rug tape. Do not have throw rugs and other things on the floor that can make you trip. What can I do in the bedroom? Use night lights. Make sure that you have a light by your bed that is easy to reach. Do not use any sheets or blankets that are too big for your bed. They should not hang down onto the floor. Have a firm chair that has side arms. You can use this for support while you get dressed. Do not have throw rugs and other things on the floor that can make you trip. What can I do in the kitchen? Clean up any spills right away. Avoid walking on wet floors. Keep items that you use a lot in easy-to-reach places. If you need to reach something above you, use a strong step stool that has a grab bar. Keep electrical cords out of the way. Do not use floor polish or wax that makes floors slippery. If you must use wax, use non-skid floor wax. Do not have throw rugs and other things on the floor that can make you trip. What can  I do with my stairs? Do not leave any items on the stairs. Make sure that there are handrails on both sides of the stairs and use them. Fix handrails that are broken or loose. Make sure that handrails are as long as the stairways. Check any carpeting to make sure that it is firmly attached to the stairs. Fix any carpet that is loose or worn. Avoid having throw rugs at the top or bottom of the stairs. If you do have throw rugs, attach them to the floor with carpet tape. Make sure that you have a light switch at the top of the stairs and the bottom of the  stairs. If you do not have them, ask someone to add them for you. What else can I do to help prevent falls? Wear shoes that: Do not have high heels. Have rubber bottoms. Are comfortable and fit you well. Are closed at the toe. Do not wear sandals. If you use a stepladder: Make sure that it is fully opened. Do not climb a closed stepladder. Make sure that both sides of the stepladder are locked into place. Ask someone to hold it for you, if possible. Clearly mark and make sure that you can see: Any grab bars or handrails. First and last steps. Where the edge of each step is. Use tools that help you move around (mobility aids) if they are needed. These include: Canes. Walkers. Scooters. Crutches. Turn on the lights when you go into a dark area. Replace any light bulbs as soon as they burn out. Set up your furniture so you have a clear path. Avoid moving your furniture around. If any of your floors are uneven, fix them. If there are any pets around you, be aware of where they are. Review your medicines with your doctor. Some medicines can make you feel dizzy. This can increase your chance of falling. Ask your doctor what other things that you can do to help prevent falls. This information is not intended to replace advice given to you by your health care provider. Make sure you discuss any questions you have with your health care provider. Document Released: 11/15/2008 Document Revised: 06/27/2015 Document Reviewed: 02/23/2014 Elsevier Interactive Patient Education  2017 [employer].

## 2020-12-10 NOTE — Assessment & Plan Note (Signed)
Chronic Controlled, Stable Continue tamsulosin 0.4 mg daily 

## 2020-12-16 DIAGNOSIS — G8929 Other chronic pain: Secondary | ICD-10-CM | POA: Diagnosis not present

## 2020-12-16 DIAGNOSIS — E2740 Unspecified adrenocortical insufficiency: Secondary | ICD-10-CM | POA: Diagnosis not present

## 2020-12-16 DIAGNOSIS — C779 Secondary and unspecified malignant neoplasm of lymph node, unspecified: Secondary | ICD-10-CM | POA: Diagnosis not present

## 2020-12-16 DIAGNOSIS — M549 Dorsalgia, unspecified: Secondary | ICD-10-CM | POA: Diagnosis not present

## 2020-12-16 DIAGNOSIS — E039 Hypothyroidism, unspecified: Secondary | ICD-10-CM | POA: Diagnosis not present

## 2020-12-16 DIAGNOSIS — C7931 Secondary malignant neoplasm of brain: Secondary | ICD-10-CM | POA: Diagnosis not present

## 2020-12-16 DIAGNOSIS — Z9884 Bariatric surgery status: Secondary | ICD-10-CM | POA: Diagnosis not present

## 2020-12-16 DIAGNOSIS — Z96611 Presence of right artificial shoulder joint: Secondary | ICD-10-CM | POA: Diagnosis not present

## 2020-12-16 DIAGNOSIS — L80 Vitiligo: Secondary | ICD-10-CM | POA: Diagnosis not present

## 2020-12-16 DIAGNOSIS — C439 Malignant melanoma of skin, unspecified: Secondary | ICD-10-CM | POA: Diagnosis not present

## 2020-12-16 DIAGNOSIS — C4359 Malignant melanoma of other part of trunk: Secondary | ICD-10-CM | POA: Diagnosis not present

## 2020-12-16 DIAGNOSIS — Z87891 Personal history of nicotine dependence: Secondary | ICD-10-CM | POA: Diagnosis not present

## 2020-12-17 ENCOUNTER — Encounter: Payer: Self-pay | Admitting: Internal Medicine

## 2020-12-18 ENCOUNTER — Encounter: Payer: Self-pay | Admitting: Internal Medicine

## 2020-12-18 MED ORDER — OXYCODONE HCL 5 MG PO TABS: 5.0000 mg | ORAL_TABLET | Freq: Three times a day (TID) | ORAL | 0 refills | Status: DC | PRN

## 2020-12-25 ENCOUNTER — Other Ambulatory Visit: Payer: Self-pay | Admitting: Internal Medicine

## 2021-01-07 ENCOUNTER — Other Ambulatory Visit: Payer: Self-pay | Admitting: *Deleted

## 2021-01-07 DIAGNOSIS — C779 Secondary and unspecified malignant neoplasm of lymph node, unspecified: Secondary | ICD-10-CM | POA: Diagnosis not present

## 2021-01-07 DIAGNOSIS — C439 Malignant melanoma of skin, unspecified: Secondary | ICD-10-CM | POA: Diagnosis not present

## 2021-01-07 DIAGNOSIS — C7931 Secondary malignant neoplasm of brain: Secondary | ICD-10-CM | POA: Diagnosis not present

## 2021-01-07 DIAGNOSIS — Z5112 Encounter for antineoplastic immunotherapy: Secondary | ICD-10-CM | POA: Diagnosis not present

## 2021-01-07 NOTE — Patient Outreach (Signed)
[place] [facility]) Care Management Geriatric Nurse Practitioner Note  01/07/2021 Name:  Maxwell Fox MRN:  [id number] DOB:  [birth year]  Summary: Reports reoccurrence of melanoma  Recommendations/Changes made from today's visit: Continue Care Management monthly encounters for support.  Subjective: Maxwell Fox is an 75 y.o. year old male who is a primary patient of Burns, [name], MD. The care management team was consulted for assistance with care management and/or care coordination needs.    Geriatric Nurse Practitioner completed Telephone Visit today.   Care Plan  Review of patient past medical history, allergies, medications, health status, including review of consultants reports, laboratory and other test data, was performed as part of comprehensive evaluation for care management services.   Care Plan : RN Care Manager Plan of Care  Updates made by Deloria Lair, NP since 01/07/2021 12:00 AM     Problem: Health Promotion or Disease Self-Management (General Plan of Care) Resolved 01/07/2021     Long-Range Goal: Self-Management Plan Developed together, pt agrees to complete his annual health requirements by the end of May. Completed 01/07/2021  Start Date: 03/13/2020  Expected End Date: 07/02/2020  Recent Progress: On track  Priority: Medium  Note:   Evidence-based guidance:  Support patient and family/caregiver active participation in decision-making and self-management plan.  Facilitate advance care planning.  Review need for preventive screening based on age, sex, family history and health history.    Notes: 03/13/20 Pt is ready to get back to life as usual since his pelvic fx and GI bleeding episodes which seen to be resolved. SDOH screens completed, no needs. Pt is very willing to participate in care management program. Reviewed risk factors associated with GI bleeding to avoid - NO NSAIDS pt is very aware. Identified high BMI but that is not a priority at this  time. Pt eats healthy and is working on getting back to hopefully pre-fx mobility. Reviewed gaps in care: Need to verify his 2nd COVID and booster vaccine dates and if has had a TDAP. Sending copies of Advanced Directives for he and wife to complete. 04/03/20  Nurse sent pt to ED on 2/23 for another GI bleed home after 5 days, doing well and trying again to get back on his PT for his pelvic tx. 04/10/20 All care gaps closed except for getting his Tdap and he reports he will do this soon. 01/07/21 Pt has been attending his MD appts throughout the year. Has had difficult time reducing his pain medication after his pelvic fracture. Now had recurrent melanoma which will be the ongoing focus. This goal has been completed.    Care Plan : Fall Risk (Adult)  Updates made by Deloria Lair, NP since 01/07/2021 12:00 AM     Problem: Fall Risk Resolved 01/07/2021     Goal: Absence of Fall and Fall-Related Injury over the next 3 months. Completed 01/07/2021  Start Date: 03/13/2020  Expected End Date: 07/02/2020  Priority: High  Note:   Evidence-based guidance:  Assess fall risk using a validated tool when available. Consider balance and gait impairment, muscle weakness, diminished vision or hearing, environmental hazards, presence of urinary or bowel urgency and/or incontinence.  Develop a fall prevention plan with the patient and family.  Encourage physical activity, such as performance of self-care at highest level of ability, strength and balance exercise program, and provision of appropriate assistive devices; refer to rehabilitation therapy.  If fall occurs, determine the cause and revise fall injury prevention plan.  Regularly review medication contribution to  fall risk; consider risk related to polypharmacy and age.  Balance adequate pain management with potential for oversedation.  Provide guidance related to environmental modifications.  Notes:  03/13/20 No falls since his fx in December. Pt is acutely aware  of fall risks. Using a walker and will resume PT tomorrow. Has narcotic pain medication to premedicate before PT and is aware of increased risk but benefit will help him regain his mobility. Fall assessment was completed. 04/03/20 No falls. Set back in PT as he had to be hospitalized again for another GI Bleed. PT resuming today. 01/07/21 Has recovered  from his trauma and denies any falls. Goal completed.    Problem: Reoccrence of melanoma   Priority: High  Onset Date: 01/07/2021     Long-Range Goal: Pt will attend all oncology appts including infusions over the next 3 months.   Expected End Date: 05/02/2021  Priority: High  Note:   Current Barriers: Chronic Disease Management support and education needs related to Recurrent Melanoma   RNCM Clinical Goal(s):  Patient will attend all scheduled medical appointments: (Oncology) over the next 3 months as evidenced by pt report and chart review.        demonstrate ongoing adherence to prescribed treatment plan for Melanoma as evidenced by pt report and chart review  through collaboration with RN Care manager, provider, and care team.   Interventions: Inter-disciplinary care team collaboration (see longitudinal plan of care) Evaluation of current treatment plan related to  self management and patient's adherence to plan as established by provider  Patient Goals/Self-Care Activities: Pt agrees to the following self care goals: Attend all scheduled provider appointments Call pharmacy for medication refills 3-7 days in advance of running out of medications Perform all self care activities independently  Perform IADL's (shopping, preparing meals, housekeeping, managing finances) independently Call provider office for new concerns or questions       Long-Range Goal: Depression   Start Date: 01/07/2021  Expected End Date: 05/02/2021  Priority: Medium  Note:   Current Barriers:  Mood state  RNCM Clinical Goal(s):  Patient will continue to work  with RN Care Manager and/or Social Worker to address care management and care coordination needs related to Cancer/Reoccurence of Melanoma as evidenced by adherence to CM Team Scheduled appointments     collaborate with the care management team towards completion of advanced directives within the next month as evidenced by pt report and seeing pt copy in his chart  through collaboration with Consulting civil engineer, provider, and care team.  Monthly discussions regarding state of mind, need for additional support or medications to lift his mood.  Interventions: Inter-disciplinary care team collaboration (see longitudinal plan of care) Evaluation of current treatment plan related to  self management and patient's adherence to plan as established by provider Pt reinforced to call MD or NP for any problem or questions.  Patient Goals/Self-Care Activities: Complete HCPOA and LIving Will previous provided by NP within the next 30 days. Complete new PQR9 on next encounter with NP.        Plan: Telephone follow up appointment with care management team member scheduled for:  Jan 6h, 2023  [name] Myrtie Neither, MSN, [facility] Gerontological Nurse Practitioner [facility] Care Management [phone]

## 2021-01-23 DIAGNOSIS — M791 Myalgia, unspecified site: Secondary | ICD-10-CM | POA: Diagnosis not present

## 2021-01-29 DIAGNOSIS — K1379 Other lesions of oral mucosa: Secondary | ICD-10-CM | POA: Diagnosis not present

## 2021-01-29 DIAGNOSIS — E273 Drug-induced adrenocortical insufficiency: Secondary | ICD-10-CM | POA: Diagnosis not present

## 2021-01-29 DIAGNOSIS — H538 Other visual disturbances: Secondary | ICD-10-CM | POA: Diagnosis not present

## 2021-01-29 DIAGNOSIS — L299 Pruritus, unspecified: Secondary | ICD-10-CM | POA: Diagnosis not present

## 2021-01-30 DIAGNOSIS — H2000 Unspecified acute and subacute iridocyclitis: Secondary | ICD-10-CM | POA: Diagnosis not present

## 2021-02-07 ENCOUNTER — Other Ambulatory Visit: Payer: Self-pay | Admitting: *Deleted

## 2021-02-07 ENCOUNTER — Other Ambulatory Visit: Payer: Self-pay | Admitting: Internal Medicine

## 2021-02-07 DIAGNOSIS — H2000 Unspecified acute and subacute iridocyclitis: Secondary | ICD-10-CM | POA: Diagnosis not present

## 2021-02-07 MED ORDER — LORAZEPAM 1 MG PO TABS: 1.0000 mg | ORAL_TABLET | Freq: Three times a day (TID) | ORAL | 1 refills | Status: DC | PRN

## 2021-02-07 NOTE — Patient Outreach (Signed)
[facility] [facility]) Care Management Geriatric Nurse Practitioner Note   02/10/2021 Name:  Maxwell Fox MRN:  [id number] DOB:  October 17, 1945  Summary: Maxwell Fox immunotherapy, poor tolerance, very anxious, "clonazepam doesn't do a thing" Pain controlled  Recommendations/Changes made from today's visit: Contacted Dr. Quay Burow regarding anxiety - Lorazepam 1.0 mg q 8 hours as needed given.  Subjective: Maxwell Fox is an 76 y.o. year old male who is a primary patient of Burns, [name], MD. The care management team was consulted for assistance with care management and/or care coordination needs.    Geriatric Nurse Practitioner completed Telephone Visit today.   Objective:  Medications Reviewed Today     Reviewed by [name], MD (Physician) on 12/10/20 at 1033  Med List Status: <None>   Medication Order Taking? Sig Documenting Provider Last Dose Status Informant  acetaminophen (TYLENOL) 325 MG tablet [id number] No Take 2 tablets (650 mg total) by mouth every 6 (six) hours as needed for moderate pain. Arrien, Jimmy Picket, MD Taking Active   calcium carbonate (OS-CAL - DOSED IN MG OF ELEMENTAL CALCIUM) 1250 (500 Ca) MG tablet [id number] No Take 1 tablet by mouth daily with supper. [provider] Taking Active Self  Cholecalciferol (VITAMIN D) 2000 UNITS tablet [id number] No Take 1 tablet (2,000 Units total) by mouth daily. [name], MD Taking Active Self  clonazePAM (KLONOPIN) 1 MG tablet [id number] No TAKE 1 TABLET(1 MG) BY MOUTH TWICE DAILY AS NEEDED FOR ANXIETY Burns, [name], MD Taking Active   escitalopram (LEXAPRO) 20 MG tablet [id number] No Take 1 tablet (20 mg total) by mouth daily. [name], MD Taking Active   levothyroxine (SYNTHROID) 88 MCG tablet [id number] No TAKE 1 TABLET(88 MCG) BY MOUTH DAILY BEFORE Nyoka Cowden, MD Taking Active   Multiple Vitamin (MULTIVITAMIN WITH MINERALS) TABS tablet [id number] No Take 1 tablet by mouth  daily. [provider] Taking Active Self  omeprazole (PRILOSEC) 40 MG capsule [id number] No TAKE 1 CAPSULE(40 MG) BY MOUTH TWICE DAILY Esterwood, Amy S, PA-C Taking Active   Oxycodone HCl 10 MG TABS [id number] No Take 10 mg in the morning and 5 mg in the evening Burns, [name], MD Taking Active   polyethylene glycol (MIRALAX / GLYCOLAX) 17 g packet [id number] No Take 17 g by mouth daily as needed for mild constipation or moderate constipation. Pokhrel, Laxman, MD Taking Active Self  predniSONE (DELTASONE) 5 MG tablet [id number] No TAKE 1 TABLET BY MOUTH EVERY DAY WITH Nyoka Cowden, MD Taking Active   tamsulosin (FLOMAX) 0.4 MG CAPS capsule [id number] No TAKE 1 CAPSULE(0.4 MG) BY MOUTH DAILY AFTER AND SUPPER Burns, [name], MD Taking Active            Care Plan  Review of patient past medical history, allergies, medications, health status, including review of consultants reports, laboratory and other test data, was performed as part of comprehensive evaluation for care management services.   Patient states he wants to live and will do all he can to live over the next 3 months . Barriers: Disease process  RNCM Clinical Goal(s):  Patient will verbalize basic understanding of his metastatic melanoma disease process and self health management plan as evidenced by discussions with NP every month over the next 3 months through collaboration with RN Care manager, provider, and care team.   Interventions: Inter-disciplinary care team collaboration (see longitudinal plan of care) Evaluation of current treatment plan related to  self management and patient's adherence to plan  as established by provider  Patient Goals/Self-Care Activities: Take medications as prescribed   Attend all scheduled provider appointments Call pharmacy for medication refills 3-7 days in advance of running out of medications Perform all self care activities independently  Perform IADL's (shopping,  preparing meals, housekeeping, managing finances) independently Call provider office for new concerns or questions    Pt agrees to plan of care and next appt is in 1 month.  [name] Myrtie Neither, MSN, [facility] Gerontological Nurse Practitioner [facility] Care Management [phone]

## 2021-02-07 NOTE — Progress Notes (Signed)
Bradd Canary, NP sent me a message regarding Ron's anxiety, which is currently not controlled with clonazepam twice daily.  Asked if we can consider lorazepam 1 mg every 8 hours as needed for anxiety, which I think is reasonable-he is currently going through treatment for his melanoma that has recurred.  Lorazepam 1 mg every 8 hours as needed for anxiety sent to pharmacy

## 2021-02-18 DIAGNOSIS — K1379 Other lesions of oral mucosa: Secondary | ICD-10-CM | POA: Diagnosis not present

## 2021-02-18 DIAGNOSIS — C779 Secondary and unspecified malignant neoplasm of lymph node, unspecified: Secondary | ICD-10-CM | POA: Diagnosis not present

## 2021-02-18 DIAGNOSIS — L299 Pruritus, unspecified: Secondary | ICD-10-CM | POA: Diagnosis not present

## 2021-02-18 DIAGNOSIS — M791 Myalgia, unspecified site: Secondary | ICD-10-CM | POA: Diagnosis not present

## 2021-02-18 DIAGNOSIS — C439 Malignant melanoma of skin, unspecified: Secondary | ICD-10-CM | POA: Diagnosis not present

## 2021-02-18 DIAGNOSIS — E273 Drug-induced adrenocortical insufficiency: Secondary | ICD-10-CM | POA: Diagnosis not present

## 2021-02-20 ENCOUNTER — Ambulatory Visit: Payer: Medicare Other | Admitting: Internal Medicine

## 2021-02-21 DIAGNOSIS — H2000 Unspecified acute and subacute iridocyclitis: Secondary | ICD-10-CM | POA: Diagnosis not present

## 2021-02-21 DIAGNOSIS — H5213 Myopia, bilateral: Secondary | ICD-10-CM | POA: Diagnosis not present

## 2021-03-09 ENCOUNTER — Encounter: Payer: Self-pay | Admitting: Internal Medicine

## 2021-03-09 NOTE — Progress Notes (Signed)
Subjective:    Patient ID: Maxwell Fox, male    DOB: 10-Jan-1946, 76 y.o.   MRN: [id number]  This visit occurred during the SARS-CoV-2 public health emergency.  Safety protocols were in place, including screening questions prior to the visit, additional usage of staff PPE, and extensive cleaning of exam room while observing appropriate contact time as indicated for disinfecting solutions.     HPI The patient is here for follow up of their chronic medical problems, including metastatic melanoma on ipilimumab / nivolumab with prednisone, GERD, hypothyroidism, anxiety/depression   He is back on treatment for the melanoma - he is having a lot of side effects - fatigue, pain, vision problems, rash.  He is on prednisone to help.  He will no longer have the two meds together due to side effects.  The plan is this month the 27th MRI, 28th CT scan, lab and another consultation to see what treatment is needed if any.   He is taking oxycodone 10 mg Q 4 hrs.  His pain is not controlled.  The higher doses of prednisone helped but he is back on his maintenance prednisone.    He has known cancer in his lymph node in his right axilla and probably in the left shin.       Medications and allergies reviewed with patient and updated if appropriate.  Patient Active Problem List   Diagnosis Date Noted   Chronic pain syndrome 08/24/2020   History of Roux-en-Y gastric bypass    Acute blood loss anemia 02/14/2020   H/O: upper GI bleed 02/14/2020   Gastroesophageal reflux disease with esophagitis without hemorrhage    Closed fracture of left superior pubic ramus (HCC) 02/09/2020   Anxiety and depression 01/10/2018   Chronic midline low back pain without sciatica 04/19/2017   Adrenal insufficiency due to cancer therapy ([place]) 07/27/2016   Secondary hypothyroidism 07/27/2016   H/O gastric bypass 01/06/2016   Pancreatic cyst 01/06/2016   Metastatic melanoma to lymph node (HCC)-[facility]  12/31/2015   Iron deficiency anemia    B12 nutritional deficiency    Benign prostatic hyperplasia     Current Outpatient Medications on File Prior to Visit  Medication Sig Dispense Refill   acetaminophen (TYLENOL) 325 MG tablet Take 2 tablets (650 mg total) by mouth every 6 (six) hours as needed for moderate pain.     calcium carbonate (OS-CAL - DOSED IN MG OF ELEMENTAL CALCIUM) 1250 (500 Ca) MG tablet Take 1 tablet by mouth daily with supper.     Cholecalciferol (VITAMIN D) 2000 UNITS tablet Take 1 tablet (2,000 Units total) by mouth daily. 30 tablet 11   escitalopram (LEXAPRO) 20 MG tablet Take 1 tablet (20 mg total) by mouth daily. 90 tablet 1   levothyroxine (SYNTHROID) 88 MCG tablet TAKE 1 TABLET(88 MCG) BY MOUTH DAILY BEFORE BREAKFAST 90 tablet 0   LORazepam (ATIVAN) 1 MG tablet Take 1 tablet (1 mg total) by mouth every 8 (eight) hours as needed for anxiety. 90 tablet 1   mometasone (ELOCON) 0.1 % cream Apply topically.     Multiple Vitamin (MULTIVITAMIN WITH MINERALS) TABS tablet Take 1 tablet by mouth daily.     nystatin (MYCOSTATIN) 100000 UNIT/ML suspension Take by mouth.     omeprazole (PRILOSEC) 40 MG capsule TAKE 1 CAPSULE(40 MG) BY MOUTH TWICE DAILY 180 capsule 0   Oxycodone HCl 10 MG TABS Take by mouth.     prednisoLONE acetate (PRED FORTE) 1 % ophthalmic suspension SMARTSIG:In  Eye(s)     predniSONE (DELTASONE) 5 MG tablet TAKE 1 TABLET BY MOUTH EVERY DAY WITH BREAKFAST 90 tablet 0   tamsulosin (FLOMAX) 0.4 MG CAPS capsule TAKE 1 CAPSULE(0.4 MG) BY MOUTH DAILY AFTER AND SUPPER 90 capsule 1   UNABLE TO FIND Patient uses [facility] mouthwash with lidocaine 1:1     No current facility-administered medications on file prior to visit.    Past Medical History:  Diagnosis Date   B12 nutritional deficiency    malabsorbtion   Blood transfusion without reported diagnosis 2022   BPH (benign prostatic hypertrophy)    Brain cancer ([place]) 2017   removed "golf ball sized mass"    Depression    on meds   Diverticulosis    GERD (gastroesophageal reflux disease)    on meds   GI bleeding 02/2020   Hypertension    Iron deficiency anemia    malabsorption related (s/p gastric bypass)   Melanoma (HCC)    Obesity    s/p gastric bypass 2000, start 355#   Pancreatic cyst    Pulmonary nodules    Thyroid disease    on meds    Past Surgical History:  Procedure Laterality Date   CHOLECYSTECTOMY  2000   COLONOSCOPY WITH PROPOFOL N/A 03/05/2020   Procedure: COLONOSCOPY WITH PROPOFOL;  Surgeon: Irving Copas., MD;  Location: [facility] ENDOSCOPY;  Service: Gastroenterology;  Laterality: N/A;   CRANIOTOMY FOR TUMOR  12/11/2015   metastatic melanoma   ENTEROSCOPY N/A 03/30/2020   Procedure: ENTEROSCOPY;  Surgeon: Irene Shipper, MD;  Location: [facility] ENDOSCOPY;  Service: Endoscopy;  Laterality: N/A;   ESOPHAGOGASTRODUODENOSCOPY N/A 03/30/2020   Procedure: ESOPHAGOGASTRODUODENOSCOPY (EGD);  Surgeon: Irene Shipper, MD;  Location: [facility] ENDOSCOPY;  Service: Endoscopy;  Laterality: N/A;   ESOPHAGOGASTRODUODENOSCOPY (EGD) WITH PROPOFOL N/A 02/14/2020   Procedure: ESOPHAGOGASTRODUODENOSCOPY (EGD) WITH PROPOFOL;  Surgeon: Lavena Bullion, DO;  Location: [facility] ENDOSCOPY;  Service: Endoscopy;  Laterality: N/A;   ESOPHAGOGASTRODUODENOSCOPY (EGD) WITH PROPOFOL N/A 02/26/2020   Procedure: ESOPHAGOGASTRODUODENOSCOPY (EGD) WITH PROPOFOL;  Surgeon: Irene Shipper, MD;  Location: [facility] ENDOSCOPY;  Service: Endoscopy;  Laterality: N/A;   ESOPHAGOGASTRODUODENOSCOPY (EGD) WITH PROPOFOL N/A 03/03/2020   Procedure: ESOPHAGOGASTRODUODENOSCOPY (EGD) WITH PROPOFOL;  Surgeon: Carol Ada, MD;  Location: [place];  Service: Endoscopy;  Laterality: N/A;   ESOPHAGOGASTRODUODENOSCOPY (EGD) WITH PROPOFOL N/A 03/28/2020   Procedure: ESOPHAGOGASTRODUODENOSCOPY (EGD) WITH PROPOFOL;  Surgeon: Irene Shipper, MD;  Location: [facility] ENDOSCOPY;  Service: Endoscopy;  Laterality: N/A;   FOREIGN BODY REMOVAL  03/30/2020   Procedure:  FOREIGN BODY REMOVAL;  Surgeon: Irene Shipper, MD;  Location: [facility] ENDOSCOPY;  Service: Endoscopy;;   GIVENS CAPSULE STUDY N/A 03/28/2020   Procedure: GIVENS CAPSULE STUDY;  Surgeon: Irene Shipper, MD;  Location: [facility] ENDOSCOPY;  Service: Endoscopy;  Laterality: N/A;   HEMOSTASIS CONTROL  02/26/2020   Procedure: HEMOSTASIS CONTROL;  Surgeon: Irene Shipper, MD;  Location[facility] ENDOSCOPY;  Service: Endoscopy;;   HEMOSTASIS CONTROL  03/30/2020   Procedure: HEMOSTASIS CONTROL;  Surgeon: Irene Shipper, MD;  Location: [facility] ENDOSCOPY;  Service: Endoscopy;;   HOT HEMOSTASIS N/A 02/26/2020   Procedure: HOT HEMOSTASIS (ARGON PLASMA COAGULATION/BICAP);  Surgeon: Irene Shipper, MD;  Location: [facility] ENDOSCOPY;  Service: Endoscopy;  Laterality: N/A;   MOHS SURGERY  2012   melanoma, mid back, [place]   TOTAL SHOULDER REPLACEMENT Right 2011   [place]   WISDOM TOOTH EXTRACTION      Social History  Socioeconomic History   Marital status: Married    Spouse name: Not on file   Number of children: 2   Years of education: Not on file   Highest education level: Not on file  Occupational History   Occupation: general contractor-retired  Tobacco Use   Smoking status: Former    Types: Cigarettes    Quit date: 02/03/1963    Years since quitting: 58.1   Smokeless tobacco: Never  Vaping Use   Vaping Use: Never used  Substance and Sexual Activity   Alcohol use: Not Currently    Comment: wine 2x/week   Drug use: Never   Sexual activity: Not on file  Other Topics Concern   Not on file  Social History Narrative   Lives with wife   Retired - Conservator, 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 [employer]   Social Determinants of Radio broadcast assistant Strain: Low Risk    Difficulty of Paying Living Expenses: Not hard at [employer] Insecurity: Not on file  Transportation Needs: Unknown   Lack of Transportation (Medical): No   Lack of Transportation (Non-Medical): Not on file   Physical Activity: Sufficiently Active   Days of Exercise per Week: 5 days   Minutes of Exercise per Session: 30 min  Stress: No Stress Concern Present   Feeling of Stress : Not at all  Social Connections: Socially Integrated   Frequency of Communication with Friends and Family: More than three times a week   Frequency of Social Gatherings with Friends and Family: More than three times a week   Attends Religious Services: More than 4 times per year   Active Member of [employer] or Organizations: Yes   Attends Music therapist: More than 4 times per year   Marital Status: Married    Family History  Problem Relation Age of Onset   Hypertension Father    Macular degeneration Mother    Melanoma Mother    Melanoma Sister    Stomach cancer Sister 57       duodenal   Breast cancer Sister    Uterine cancer Sister    Melanoma Brother    Colon polyps Neg Hx    Colon cancer Neg Hx    Esophageal cancer Neg Hx    Rectal cancer Neg Hx     Review of Systems  Constitutional:  Negative for appetite change and fever.  Respiratory:  Positive for cough. Negative for shortness of breath and wheezing.   Cardiovascular:  Positive for leg swelling. Negative for chest pain and palpitations.  Gastrointestinal:  Positive for nausea (occ).       No gerd  Neurological:  Negative for light-headedness and headaches.  Psychiatric/Behavioral:  The patient is nervous/anxious.       Objective:   Vitals:   03/12/21 0913  BP: 100/60  Pulse: 71  Temp: 98.2 F (36.8 C)  SpO2: 98%   BP Readings from Last 3 Encounters:  03/12/21 100/60  12/10/20 116/80  12/10/20 116/80   Wt Readings from Last 3 Encounters:  03/12/21 180 lb 3.2 oz (81.7 kg)  12/10/20 180 lb 3.2 oz (81.7 kg)  12/10/20 180 lb 3.2 oz (81.7 kg)   Body mass index is 25.13 kg/m.   Physical Exam    Constitutional: Appears well-developed and well-nourished. No distress.  HENT:  Head: Normocephalic and atraumatic.  Neck:  Neck supple. No tracheal deviation present. No thyromegaly present.  No cervical lymphadenopathy Cardiovascular: Normal rate, regular  rhythm and normal heart sounds.  No murmur heard. No carotid bruit .  1 + b/l LE  edema-wearing compression socks Pulmonary/Chest: Effort normal and breath sounds normal. No respiratory distress. No has no wheezes. No rales.  Skin: Skin is warm and dry. Not diaphoretic.  Psychiatric: Normal mood and affect. Behavior is normal.      Assessment & Plan:    See Problem List for Assessment and Plan of chronic medical problems.

## 2021-03-09 NOTE — Patient Instructions (Addendum)
   Medications changes include :   none    Please followup in 6 months   

## 2021-03-10 DIAGNOSIS — K1379 Other lesions of oral mucosa: Secondary | ICD-10-CM | POA: Diagnosis not present

## 2021-03-10 DIAGNOSIS — H209 Unspecified iridocyclitis: Secondary | ICD-10-CM | POA: Diagnosis not present

## 2021-03-10 DIAGNOSIS — M2550 Pain in unspecified joint: Secondary | ICD-10-CM | POA: Diagnosis not present

## 2021-03-10 DIAGNOSIS — R530 Neoplastic (malignant) related fatigue: Secondary | ICD-10-CM | POA: Diagnosis not present

## 2021-03-10 DIAGNOSIS — H538 Other visual disturbances: Secondary | ICD-10-CM | POA: Diagnosis not present

## 2021-03-10 DIAGNOSIS — Z9221 Personal history of antineoplastic chemotherapy: Secondary | ICD-10-CM | POA: Diagnosis not present

## 2021-03-10 DIAGNOSIS — Z7964 Long term (current) use of myelosuppressive agent: Secondary | ICD-10-CM | POA: Diagnosis not present

## 2021-03-10 DIAGNOSIS — Z859 Personal history of malignant neoplasm, unspecified: Secondary | ICD-10-CM | POA: Diagnosis not present

## 2021-03-10 DIAGNOSIS — C7931 Secondary malignant neoplasm of brain: Secondary | ICD-10-CM | POA: Diagnosis not present

## 2021-03-10 DIAGNOSIS — Z08 Encounter for follow-up examination after completed treatment for malignant neoplasm: Secondary | ICD-10-CM | POA: Diagnosis not present

## 2021-03-12 ENCOUNTER — Other Ambulatory Visit: Payer: Self-pay

## 2021-03-12 ENCOUNTER — Encounter: Payer: Self-pay | Admitting: Internal Medicine

## 2021-03-12 ENCOUNTER — Ambulatory Visit (INDEPENDENT_AMBULATORY_CARE_PROVIDER_SITE_OTHER): Payer: Medicare Other | Admitting: Internal Medicine

## 2021-03-12 VITALS — BP 100/60 | HR 71 | Temp 98.2°F | Ht 71.0 in | Wt 180.2 lb

## 2021-03-12 DIAGNOSIS — K2100 Gastro-esophageal reflux disease with esophagitis, without bleeding: Secondary | ICD-10-CM

## 2021-03-12 DIAGNOSIS — E038 Other specified hypothyroidism: Secondary | ICD-10-CM | POA: Diagnosis not present

## 2021-03-12 DIAGNOSIS — F419 Anxiety disorder, unspecified: Secondary | ICD-10-CM | POA: Diagnosis not present

## 2021-03-12 DIAGNOSIS — C779 Secondary and unspecified malignant neoplasm of lymph node, unspecified: Secondary | ICD-10-CM

## 2021-03-12 DIAGNOSIS — F32A Depression, unspecified: Secondary | ICD-10-CM

## 2021-03-12 DIAGNOSIS — N400 Enlarged prostate without lower urinary tract symptoms: Secondary | ICD-10-CM | POA: Diagnosis not present

## 2021-03-12 MED ORDER — CLONAZEPAM 1 MG PO TABS: ORAL_TABLET | ORAL | 2 refills | Status: DC

## 2021-03-12 NOTE — Assessment & Plan Note (Addendum)
Chronic  Clinically euthyroid Currently taking levothyroxine 88 mcg daily Recent TSH in normal range

## 2021-03-12 NOTE — Assessment & Plan Note (Signed)
Chronic GERD controlled Continue omeprazole 40 mg twice daily 

## 2021-03-12 NOTE — Assessment & Plan Note (Addendum)
Chronic Continue Flomax 0.4 mg daily 

## 2021-03-12 NOTE — Assessment & Plan Note (Addendum)
Chronic Increased anxiety and depression at times related to his cancer treatment Continue Lexapro 20 mg daily, lorazepam 0.5 mg to 8 hours as needed - usually taking it just at night, taking clonazepam as needed during the day for GAD - he never takes them together.

## 2021-03-12 NOTE — Assessment & Plan Note (Addendum)
Chronic Has metastatic disease Following with [facility] Has been taking ipilimumab / nivolumab-did have several side effects and at this point he will have additional tests to reevaluate degree of disease and then see if treatment is still needed Prednisone was increased to a higher dose due to pain Chronic pain managed by oncology with oxycodone

## 2021-03-13 ENCOUNTER — Other Ambulatory Visit: Payer: Self-pay | Admitting: *Deleted

## 2021-03-13 NOTE — Patient Outreach (Signed)
[place] [facility]) Care Management Geriatric Nurse Practitioner Note   03/13/2021 Name:  Maxwell Fox MRN:  [id number] DOB:  07-21-1945  Summary: PATIENT IS GOING THROUGH PREDNISONE TAPER WHICH IS NOT VERY AGREEABLE WITH HIM HE CONTINUES TO HAVE PAIN. HE CONTINUES HIS OXYCODONE. HE WILL HAVE AN MRI AND A PET SCAN TO REVALUATE HIS CANCER STATUS.  Recommendations/Changes made from today's visit: [place], REGARDING PAIN MANAGEMENT.  FOLLOW PRESCRIBER INSTRUCTIONS.  Subjective: Maxwell Fox is an 76 y.o. year old male who is a primary patient of Burns, [name], MD. The care management team was consulted for assistance with care management and/or care coordination needs.    Geriatric Nurse Practitioner completed Telephone Visit today.   Objective:  Medications Reviewed Today     Reviewed by [name], MD (Physician) on 03/12/21 at [place] List Status: <None>   Medication Order Taking? Sig Documenting Provider Last Dose Status Informant  acetaminophen (TYLENOL) 325 MG tablet [id number] Yes Take 2 tablets (650 mg total) by mouth every 6 (six) hours as needed for moderate pain. Arrien, Jimmy Picket, MD Taking Active   calcium carbonate (OS-CAL - DOSED IN MG OF ELEMENTAL CALCIUM) 1250 (500 Ca) MG tablet [id number] Yes Take 1 tablet by mouth daily with supper. [provider] Taking Active Self  Cholecalciferol (VITAMIN D) 2000 UNITS tablet [id number] Yes Take 1 tablet (2,000 Units total) by mouth daily. [name], MD Taking Active Self  escitalopram (LEXAPRO) 20 MG tablet [id number] Yes Take 1 tablet (20 mg total) by mouth daily. [name], MD Taking Active   levothyroxine (SYNTHROID) 88 MCG tablet [id number] Yes TAKE 1 TABLET(88 MCG) BY MOUTH DAILY BEFORE Nyoka Cowden, MD Taking Active   LORazepam (ATIVAN) 1 MG tablet [id number] Yes Take 1 tablet (1 mg total) by mouth every 8 (eight) hours as needed for anxiety. [name], MD Taking Active   mometasone (ELOCON) 0.1 % cream [id number] Yes Apply topically. [provider] Taking Active   Multiple Vitamin (MULTIVITAMIN WITH MINERALS) TABS tablet [id number] Yes Take 1 tablet by mouth daily. [provider] Taking Active Self  nystatin (MYCOSTATIN) 100000 UNIT/ML suspension [id number] Yes Take by mouth. [provider] Taking Active   omeprazole (PRILOSEC) 40 MG capsule [id number] Yes TAKE 1 CAPSULE(40 MG) BY MOUTH TWICE DAILY [place], Amy S, PA-C Taking Active   Oxycodone HCl 10 MG TABS [id number] Yes Take by mouth. [provider] Taking Active   prednisoLONE acetate (PRED FORTE) 1 % ophthalmic suspension [id number] Yes SMARTSIG:In Eye(s) [provider] Taking Active   predniSONE (DELTASONE) 5 MG tablet [id number] Yes TAKE 1 TABLET BY MOUTH EVERY DAY WITH Nyoka Cowden, MD Taking Active   tamsulosin (FLOMAX) 0.4 MG CAPS capsule [id number] Yes TAKE 1 CAPSULE(0.4 MG) BY MOUTH DAILY AFTER AND SUPPER [name], MD Taking Active   UNABLE TO FIND [id number] Yes Patient uses [facility] mouthwash with lidocaine 1:1 [provider] Taking Active              SDOH:  (Social Determinants of Health) assessments and interventions performed:    Care Plan  Review of patient past medical history, allergies, medications, health status, including review of consultants reports, laboratory and other test data, was performed as part of comprehensive evaluation for care management services.   Care Plan : RN Care Manager Plan of Care  Updates made by Deloria Lair, NP since 03/13/2021 12:00 AM  Problem: Metastatic Melanoma (palliative interventions)   Priority: High  Onset Date: 02/07/2021     Long-Range Goal: Patient states he wants to live and will do all he can to do so over the next 3 months.   Start Date: 02/07/2021  Expected End Date: 05/30/2021  This Visit's Progress: On track  Priority:  High  Note:   UPDATE 03/13/21:  (Status: Goal on Track (progressing): YES.) Long Term Goal  Evaluation of current treatment plan related to [place] and patient's adherence to plan as established by provider Maxwell Fox HAS BEEN TAPERED DOWN TO 5 MG PREDNISONE FROM 40 MG OVER  MONTH. HE IS NOT DOING WELL ON THIS DOSE. WE DISCUSSED PERHAPS ALTERING 5 MG WITH 7.5 MG FOR SEVERAL WEEKS AND THEN TRYING TO GO BACK TO 5MG . HE CONTINUES TO BE ON OXYCONTIN 5 MG QID. HE IS GOING TO RUN OUT BEFORE HIS NEXT PAIN MANAGEMENT APPT. HE HAS BEEN WITTING A LETTER TO DR. Karsten Fells TO SEE IF HE WILL WORK WITH HIM TO GET HIS PAIN LEVEL TO A SATISFACTORY LEVEL.  Current Barriers:  Disease process  RNCM Clinical Goal(s):  Patient will verbalize basic understanding of his metastatic melanoma disease process and self health management plan as evidenced by discussions with NP every month over the next 3 months  through collaboration with RN Care manager, provider, and care team.   Interventions: Inter-disciplinary care team collaboration (see longitudinal plan of care) Evaluation of current treatment plan related to  self management and patient's adherence to plan as established by provider   Patient Goals/Self-Care Activities: Take medications as prescribed   Attend all scheduled provider appointments Call pharmacy for medication refills 3-7 days in advance of running out of medications Perform all self care activities independently  Perform IADL's (shopping, preparing meals, housekeeping, managing finances) independently Call provider office for new concerns or questions         Plan: Telephone follow up appointment with care management team member scheduled for:  [facility]. PT AGREES TO PLAN OF CARE.  [name] Myrtie Neither, MSN, [facility] Gerontological Nurse Practitioner [facility] Care Management [phone]

## 2021-03-14 ENCOUNTER — Other Ambulatory Visit: Payer: Self-pay | Admitting: Internal Medicine

## 2021-03-17 DIAGNOSIS — C7951 Secondary malignant neoplasm of bone: Secondary | ICD-10-CM | POA: Diagnosis not present

## 2021-03-17 DIAGNOSIS — K869 Disease of pancreas, unspecified: Secondary | ICD-10-CM | POA: Diagnosis not present

## 2021-03-17 DIAGNOSIS — C779 Secondary and unspecified malignant neoplasm of lymph node, unspecified: Secondary | ICD-10-CM | POA: Diagnosis not present

## 2021-03-17 DIAGNOSIS — R531 Weakness: Secondary | ICD-10-CM | POA: Diagnosis not present

## 2021-03-17 DIAGNOSIS — C7931 Secondary malignant neoplasm of brain: Secondary | ICD-10-CM | POA: Diagnosis not present

## 2021-03-17 DIAGNOSIS — R21 Rash and other nonspecific skin eruption: Secondary | ICD-10-CM | POA: Diagnosis not present

## 2021-03-17 DIAGNOSIS — R42 Dizziness and giddiness: Secondary | ICD-10-CM | POA: Diagnosis not present

## 2021-03-17 DIAGNOSIS — M545 Low back pain, unspecified: Secondary | ICD-10-CM | POA: Diagnosis not present

## 2021-03-17 DIAGNOSIS — C439 Malignant melanoma of skin, unspecified: Secondary | ICD-10-CM | POA: Diagnosis not present

## 2021-03-17 DIAGNOSIS — E039 Hypothyroidism, unspecified: Secondary | ICD-10-CM | POA: Diagnosis not present

## 2021-03-17 DIAGNOSIS — M791 Myalgia, unspecified site: Secondary | ICD-10-CM | POA: Diagnosis not present

## 2021-03-17 DIAGNOSIS — E273 Drug-induced adrenocortical insufficiency: Secondary | ICD-10-CM | POA: Diagnosis not present

## 2021-03-17 DIAGNOSIS — Z9884 Bariatric surgery status: Secondary | ICD-10-CM | POA: Diagnosis not present

## 2021-03-18 DIAGNOSIS — M791 Myalgia, unspecified site: Secondary | ICD-10-CM | POA: Diagnosis not present

## 2021-03-18 DIAGNOSIS — R531 Weakness: Secondary | ICD-10-CM | POA: Diagnosis not present

## 2021-03-18 DIAGNOSIS — Z9884 Bariatric surgery status: Secondary | ICD-10-CM | POA: Diagnosis not present

## 2021-03-19 DIAGNOSIS — H2000 Unspecified acute and subacute iridocyclitis: Secondary | ICD-10-CM | POA: Diagnosis not present

## 2021-03-24 ENCOUNTER — Other Ambulatory Visit: Payer: Self-pay | Admitting: Internal Medicine

## 2021-03-31 DIAGNOSIS — S0990XA Unspecified injury of head, initial encounter: Secondary | ICD-10-CM | POA: Diagnosis not present

## 2021-03-31 DIAGNOSIS — S32010A Wedge compression fracture of first lumbar vertebra, initial encounter for closed fracture: Secondary | ICD-10-CM | POA: Diagnosis not present

## 2021-03-31 DIAGNOSIS — R109 Unspecified abdominal pain: Secondary | ICD-10-CM | POA: Diagnosis not present

## 2021-03-31 DIAGNOSIS — R0789 Other chest pain: Secondary | ICD-10-CM | POA: Diagnosis not present

## 2021-03-31 DIAGNOSIS — S41111A Laceration without foreign body of right upper arm, initial encounter: Secondary | ICD-10-CM | POA: Diagnosis not present

## 2021-03-31 DIAGNOSIS — S199XXA Unspecified injury of neck, initial encounter: Secondary | ICD-10-CM | POA: Diagnosis not present

## 2021-03-31 DIAGNOSIS — W1839XA Other fall on same level, initial encounter: Secondary | ICD-10-CM | POA: Diagnosis not present

## 2021-03-31 DIAGNOSIS — S20211A Contusion of right front wall of thorax, initial encounter: Secondary | ICD-10-CM | POA: Diagnosis not present

## 2021-03-31 DIAGNOSIS — R530 Neoplastic (malignant) related fatigue: Secondary | ICD-10-CM | POA: Diagnosis not present

## 2021-03-31 DIAGNOSIS — M545 Low back pain, unspecified: Secondary | ICD-10-CM | POA: Diagnosis not present

## 2021-03-31 DIAGNOSIS — S61411A Laceration without foreign body of right hand, initial encounter: Secondary | ICD-10-CM | POA: Diagnosis not present

## 2021-03-31 DIAGNOSIS — C439 Malignant melanoma of skin, unspecified: Secondary | ICD-10-CM | POA: Diagnosis not present

## 2021-03-31 DIAGNOSIS — N179 Acute kidney failure, unspecified: Secondary | ICD-10-CM | POA: Diagnosis not present

## 2021-03-31 DIAGNOSIS — S8991XA Unspecified injury of right lower leg, initial encounter: Secondary | ICD-10-CM | POA: Diagnosis not present

## 2021-03-31 DIAGNOSIS — S51811A Laceration without foreign body of right forearm, initial encounter: Secondary | ICD-10-CM | POA: Diagnosis not present

## 2021-03-31 DIAGNOSIS — L03113 Cellulitis of right upper limb: Secondary | ICD-10-CM | POA: Diagnosis not present

## 2021-03-31 DIAGNOSIS — M79651 Pain in right thigh: Secondary | ICD-10-CM | POA: Diagnosis not present

## 2021-03-31 DIAGNOSIS — Z9221 Personal history of antineoplastic chemotherapy: Secondary | ICD-10-CM | POA: Diagnosis not present

## 2021-03-31 DIAGNOSIS — M542 Cervicalgia: Secondary | ICD-10-CM | POA: Diagnosis not present

## 2021-03-31 DIAGNOSIS — H209 Unspecified iridocyclitis: Secondary | ICD-10-CM | POA: Diagnosis not present

## 2021-03-31 DIAGNOSIS — R079 Chest pain, unspecified: Secondary | ICD-10-CM | POA: Diagnosis not present

## 2021-03-31 DIAGNOSIS — Z9884 Bariatric surgery status: Secondary | ICD-10-CM | POA: Diagnosis not present

## 2021-03-31 DIAGNOSIS — S299XXA Unspecified injury of thorax, initial encounter: Secondary | ICD-10-CM | POA: Diagnosis not present

## 2021-03-31 DIAGNOSIS — W19XXXA Unspecified fall, initial encounter: Secondary | ICD-10-CM | POA: Diagnosis not present

## 2021-03-31 DIAGNOSIS — C7931 Secondary malignant neoplasm of brain: Secondary | ICD-10-CM | POA: Diagnosis not present

## 2021-04-04 DIAGNOSIS — E230 Hypopituitarism: Secondary | ICD-10-CM | POA: Diagnosis not present

## 2021-04-04 DIAGNOSIS — M549 Dorsalgia, unspecified: Secondary | ICD-10-CM | POA: Diagnosis not present

## 2021-04-04 DIAGNOSIS — C7931 Secondary malignant neoplasm of brain: Secondary | ICD-10-CM | POA: Diagnosis not present

## 2021-04-04 DIAGNOSIS — C439 Malignant melanoma of skin, unspecified: Secondary | ICD-10-CM | POA: Diagnosis not present

## 2021-04-04 DIAGNOSIS — R21 Rash and other nonspecific skin eruption: Secondary | ICD-10-CM | POA: Diagnosis not present

## 2021-04-04 DIAGNOSIS — H209 Unspecified iridocyclitis: Secondary | ICD-10-CM | POA: Diagnosis not present

## 2021-04-04 DIAGNOSIS — G8929 Other chronic pain: Secondary | ICD-10-CM | POA: Diagnosis not present

## 2021-04-04 DIAGNOSIS — E2740 Unspecified adrenocortical insufficiency: Secondary | ICD-10-CM | POA: Diagnosis not present

## 2021-04-04 DIAGNOSIS — E039 Hypothyroidism, unspecified: Secondary | ICD-10-CM | POA: Diagnosis not present

## 2021-04-04 DIAGNOSIS — K1379 Other lesions of oral mucosa: Secondary | ICD-10-CM | POA: Diagnosis not present

## 2021-04-04 DIAGNOSIS — C779 Secondary and unspecified malignant neoplasm of lymph node, unspecified: Secondary | ICD-10-CM | POA: Diagnosis not present

## 2021-04-04 DIAGNOSIS — Z87891 Personal history of nicotine dependence: Secondary | ICD-10-CM | POA: Diagnosis not present

## 2021-04-07 ENCOUNTER — Other Ambulatory Visit: Payer: Self-pay | Admitting: *Deleted

## 2021-04-07 NOTE — Patient Outreach (Signed)
[facility]) Care Management Geriatric Nurse Practitioner Note   04/07/2021 Name:  Maxwell Fox MRN:  [id number] DOB:  01-15-1946  Summary: Has had another fall with minor injuries. Medication use is still of great concern.  Recommendations/Changes made from today's visit: Discussed weaning off oxycodone slowly to avoid withdrawal sxs. Counseling with primary provider and perhaps referral to pain management.  Subjective: Maxwell Fox is an 76 y.o. year old male who is a primary patient of Burns, [name], MD. The care management team was consulted for assistance with care management and/or care coordination needs.    Geriatric Nurse Practitioner completed Telephone Visit today.   Patient Active Problem List   Diagnosis Date Noted   Chronic pain syndrome 08/24/2020   History of Roux-en-Y gastric bypass    Acute blood loss anemia 02/14/2020   H/O: upper GI bleed 02/14/2020   Gastroesophageal reflux disease with esophagitis without hemorrhage    Closed fracture of left superior pubic ramus (HCC) 02/09/2020   Anxiety and depression 01/10/2018   Chronic midline low back pain without sciatica 04/19/2017   Adrenal insufficiency due to cancer therapy ([place]) 07/27/2016   Secondary hypothyroidism 07/27/2016   H/O gastric bypass 01/06/2016   Pancreatic cyst 01/06/2016   Metastatic melanoma to lymph node (HCC)-[facility] 12/31/2015   Iron deficiency anemia    B12 nutritional deficiency    Benign prostatic hyperplasia    Outpatient Encounter Medications as of 04/07/2021  Medication Sig   acetaminophen (TYLENOL) 325 MG tablet Take 2 tablets (650 mg total) by mouth every 6 (six) hours as needed for moderate pain.   calcium carbonate (OS-CAL - DOSED IN MG OF ELEMENTAL CALCIUM) 1250 (500 Ca) MG tablet Take 1 tablet by mouth daily with supper.   Cholecalciferol (VITAMIN D) 2000 UNITS tablet Take 1 tablet (2,000 Units total) by mouth daily.   clonazePAM (KLONOPIN) 1 MG tablet  TAKE 1 TABLET(1 MG) BY MOUTH DAILY AS NEEDED FOR ANXIETY   escitalopram (LEXAPRO) 20 MG tablet Take 1 tablet (20 mg total) by mouth daily.   levothyroxine (SYNTHROID) 88 MCG tablet TAKE 1 TABLET(88 MCG) BY MOUTH DAILY BEFORE BREAKFAST   LORazepam (ATIVAN) 1 MG tablet Take 1 tablet (1 mg total) by mouth every 8 (eight) hours as needed for anxiety.   mometasone (ELOCON) 0.1 % cream Apply topically.   Multiple Vitamin (MULTIVITAMIN WITH MINERALS) TABS tablet Take 1 tablet by mouth daily.   nystatin (MYCOSTATIN) 100000 UNIT/ML suspension Take by mouth.   omeprazole (PRILOSEC) 40 MG capsule TAKE 1 CAPSULE(40 MG) BY MOUTH TWICE DAILY   Oxycodone HCl 10 MG TABS Take by mouth.   prednisoLONE acetate (PRED FORTE) 1 % ophthalmic suspension SMARTSIG:In Eye(s)   predniSONE (DELTASONE) 5 MG tablet TAKE 1 TABLET BY MOUTH EVERY DAY WITH BREAKFAST   tamsulosin (FLOMAX) 0.4 MG CAPS capsule TAKE 1 CAPSULE(0.4 MG) BY MOUTH DAILY AFTER AND SUPPER   UNABLE TO FIND Patient uses [facility] mouthwash with lidocaine 1:1   No facility-administered encounter medications on file as of 04/07/2021.   Care Plan  Review of patient past medical history, allergies, medications, health status, including review of consultants reports, laboratory and other test data, was performed as part of comprehensive evaluation for care management services.   Care Plan : RN Care Manager Plan of Care  Updates made by Deloria Lair, NP since 04/07/2021 12:00 AM     Problem: Metastatic Melanoma (palliative interventions)   Priority: High  Onset Date: 02/07/2021     [facility]  Goal: Patient states he wants to live and will do all he can to do so over the next 6 months.   Start Date: 02/07/2021  Expected End Date: 07/08/2021  Recent Progress: On track  Priority: High  Note:    Update 04/07/21:  (Status: Goal on Track (progressing): YES.) Long Term Goal  Evaluation of current treatment plan related to Metastatic Melanoma (in remission at  this time) and patient's adherence to plan as established by provider. Pt reports he is only under observation at this time. He will be seen every 3 months for continued surveillance. Advised to follow his oncology schedule and to call if he has any related problems to discuss.   UPDATE 03/13/21:  (Status: Goal on Track (progressing): YES.) Long Term Goal  Evaluation of current treatment plan related to PAIN MANAGEMENT AND QUALITY OF LIFE and patient's adherence to plan as established by provider [name] HAS BEEN TAPERED DOWN TO 5 MG PREDNISONE FROM 40 MG OVER  MONTH. HE IS NOT DOING WELL ON THIS DOSE. WE DISCUSSED PERHAPS ALTERING 5 MG WITH 7.5 MG FOR SEVERAL WEEKS AND THEN TRYING TO GO BACK TO '5MG'$ . HE CONTINUES TO BE ON OXYCONTIN 5 MG QID. HE IS GOING TO RUN OUT BEFORE HIS NEXT PAIN MANAGEMENT APPT. HE HAS BEEN WITTING A LETTER TO DR. Karsten Fells TO SEE IF HE WILL WORK WITH HIM TO GET HIS PAIN LEVEL TO A SATISFACTORY LEVEL.  Current Barriers:  Disease process  RNCM Clinical Goal(s):  Patient will verbalize basic understanding of his metastatic melanoma disease process and self health management plan as evidenced by discussions with NP every month over the next 3 months  through collaboration with RN Care manager, provider, and care team.   Interventions: Inter-disciplinary care team collaboration (see longitudinal plan of care) Evaluation of current treatment plan related to  self management and patient's adherence to plan as established by provider   Patient Goals/Self-Care Activities: Take medications as prescribed   Attend all scheduled provider appointments Call pharmacy for medication refills 3-7 days in advance of running out of medications Perform all self care activities independently  Perform IADL's (shopping, preparing meals, housekeeping, managing finances) independently Call provider office for new concerns or questions       Problem: Falls   Priority: High  Onset Date:  04/07/2021     Long-Range Goal: Patient will not fall and sustain an injury requiring a hospitalization over the next 3 months.   Start Date: 04/07/2021  Expected End Date: 07/08/2021  Priority: High  Note:   Current Barriers:  Falls Not following fall precautions.  RNCM Clinical Goal(s):  Patient will demonstrate improved adherence to prescribed treatment plan for FALL PREVENTION as evidenced by NO ED VISIT FOR FALL INJURY. work with [place] as evidenced by PT REPORT AND CHART REVIEW.  through collaboration with RN Care manager, provider, and care team.   Interventions: Inter-disciplinary care team collaboration (see longitudinal plan of care) Evaluation of current treatment plan related to  self management and patient's adherence to plan as established by provider Reviewed current practice to avoid falls: not using any ambulatory assistance when he fell at hospital. Reports he is using 2 canes at home, one for each side. Advised that strategy is not recommended. Advised he should be using his walker (HIS BEST FRIEND) to avoid falls. Arise slowly and stand for a minute before walking. Be aware of your surroundings. Choose best way or methods of least risk when  moving around. Move obstacles in home. Pt to get started with another round of PT.  Patient Goals/Self-Care Activities: Work with PT to reach goals over time prescribed services are ordered.      Problem: Dependence on narcotic pain medication   Priority: Medium  Onset Date: 04/07/2021     Long-Range Goal: Patient will ween off oxycodone over the next 2 months as reported by pt and noted in chart review of prescribing MDs.   Start Date: 04/07/2021  Expected End Date: 07/02/2021  Priority: Medium  Note:   Current Barriers:  Chronic Disease Management support and education needs related to chronic pain and drug dependence  RNCM Clinical Goal(s):  Patient will take all medications exactly as  prescribed and will call provider for medication related questions as evidenced by pt report.    attend all scheduled medical appointments: Narcotic prescribing MD as evidenced by pt and chart review.        demonstrate improved health management independence as evidenced by Pt report of his drug weaning schedule and chart review.        work with Prescribing MD to work out a schedule for drug cessation as evidenced by pt report and chart review.  through collaboration with RN Care manager, provider, and care team.   Interventions: Inter-disciplinary care team collaboration (see longitudinal plan of care) Evaluation of current treatment plan related to  self management and patient's adherence to plan as established by provider       Patient has hx of pelvic fracture in 2022 and stated taking oxycodone at that time. He had weaned himself down but still taking an occasional tablet and then his cancer returned and he had immunotherapy which caused a cascade of problems including pain. He was restarted on a new regimen of oxycodone and he had started to wean himself recently from 6 pills a day to 5 pills a day. He recently had a fall in the oncology area. He knees gave out and he sustained bumps and bruises. He was evaluated and deemed appropriate to go home. He reports today on our call that he is really sore and disappointed that now he has more pain again and is worried that he will run out of pain medication before he can wean himself down again. Oncologist has said she would only provide an RX for another 30 days and then he will have to get future refills from his primary care provider or from pain management. We discussed the need to cut down and we discussed trying to move to taking on 3 oxycodone a day with a dose of APAP 1000 mg in between. After a week or two he can go to 2 tabs a day with 3 doses of APAP '1000mg'$  daily and on until he is off the drub. He states he understands he needs to do this and  wants to but acknowledges it is very hard to wean down.  Patient Goals/Self-Care Activities: Take medications as prescribed   Attend all scheduled provider appointments        Plan: Telephone follow up appointment with care management team member scheduled for:  07/08/21 Pt agrees to plan of care.  [name] Myrtie Neither, MSN, [facility] Gerontological Nurse Practitioner [facility] [place] Care Management [phone]

## 2021-04-08 DIAGNOSIS — H2000 Unspecified acute and subacute iridocyclitis: Secondary | ICD-10-CM | POA: Diagnosis not present

## 2021-04-10 DIAGNOSIS — M25571 Pain in right ankle and joints of right foot: Secondary | ICD-10-CM | POA: Diagnosis not present

## 2021-04-10 DIAGNOSIS — M25561 Pain in right knee: Secondary | ICD-10-CM | POA: Diagnosis not present

## 2021-04-10 DIAGNOSIS — M2550 Pain in unspecified joint: Secondary | ICD-10-CM | POA: Diagnosis not present

## 2021-04-10 DIAGNOSIS — H209 Unspecified iridocyclitis: Secondary | ICD-10-CM | POA: Diagnosis not present

## 2021-04-10 DIAGNOSIS — R531 Weakness: Secondary | ICD-10-CM | POA: Diagnosis not present

## 2021-04-11 DIAGNOSIS — Z85841 Personal history of malignant neoplasm of brain: Secondary | ICD-10-CM | POA: Diagnosis not present

## 2021-04-11 DIAGNOSIS — T451X5S Adverse effect of antineoplastic and immunosuppressive drugs, sequela: Secondary | ICD-10-CM | POA: Diagnosis not present

## 2021-04-11 DIAGNOSIS — E231 Drug-induced hypopituitarism: Secondary | ICD-10-CM | POA: Diagnosis not present

## 2021-04-11 DIAGNOSIS — C7951 Secondary malignant neoplasm of bone: Secondary | ICD-10-CM | POA: Diagnosis not present

## 2021-04-11 DIAGNOSIS — M549 Dorsalgia, unspecified: Secondary | ICD-10-CM | POA: Diagnosis not present

## 2021-04-11 DIAGNOSIS — Z85820 Personal history of malignant melanoma of skin: Secondary | ICD-10-CM | POA: Diagnosis not present

## 2021-04-11 DIAGNOSIS — Z7952 Long term (current) use of systemic steroids: Secondary | ICD-10-CM | POA: Diagnosis not present

## 2021-04-11 DIAGNOSIS — I10 Essential (primary) hypertension: Secondary | ICD-10-CM | POA: Diagnosis not present

## 2021-04-11 DIAGNOSIS — Z9884 Bariatric surgery status: Secondary | ICD-10-CM | POA: Diagnosis not present

## 2021-04-11 DIAGNOSIS — Z923 Personal history of irradiation: Secondary | ICD-10-CM | POA: Diagnosis not present

## 2021-04-11 DIAGNOSIS — E2749 Other adrenocortical insufficiency: Secondary | ICD-10-CM | POA: Diagnosis not present

## 2021-04-11 DIAGNOSIS — E039 Hypothyroidism, unspecified: Secondary | ICD-10-CM | POA: Diagnosis not present

## 2021-04-11 DIAGNOSIS — C773 Secondary and unspecified malignant neoplasm of axilla and upper limb lymph nodes: Secondary | ICD-10-CM | POA: Diagnosis not present

## 2021-04-11 DIAGNOSIS — K862 Cyst of pancreas: Secondary | ICD-10-CM | POA: Diagnosis not present

## 2021-04-11 DIAGNOSIS — Z9181 History of falling: Secondary | ICD-10-CM | POA: Diagnosis not present

## 2021-04-11 DIAGNOSIS — G8929 Other chronic pain: Secondary | ICD-10-CM | POA: Diagnosis not present

## 2021-04-11 DIAGNOSIS — C7800 Secondary malignant neoplasm of unspecified lung: Secondary | ICD-10-CM | POA: Diagnosis not present

## 2021-04-11 DIAGNOSIS — Z96611 Presence of right artificial shoulder joint: Secondary | ICD-10-CM | POA: Diagnosis not present

## 2021-04-11 DIAGNOSIS — Z87891 Personal history of nicotine dependence: Secondary | ICD-10-CM | POA: Diagnosis not present

## 2021-04-11 IMAGING — CT CT CTA ABD/PEL W/CM AND/OR W/O CM
3 of 12 series · 12 of 46 positions shown, 17 images · IV contrast (OMNIPAQUE 300)
Comparison: None.

CLINICAL DATA: GI bleed

EXAM:
CT ANGIOGRAPHY ABDOMEN AND PELVIS WITH CONTRAST AND WITHOUT CONTRAST
TECHNIQUE: Multidetector CT imaging of the abdomen and pelvis was performed
using the standard protocol during bolus administration of
intravenous contrast. Multiplanar reconstructed images and MIPs were
obtained and reviewed to evaluate the vascular anatomy.
CONTRAST:  100mL OMNIPAQUE IOHEXOL 350 MG/ML SOLN

[Series 6: axial arterial · axial · arterial · 0.85mm/px · z∈[-410,-124]mm · 7 of 215 slices shown]
[im 18/215  soft-tissue]
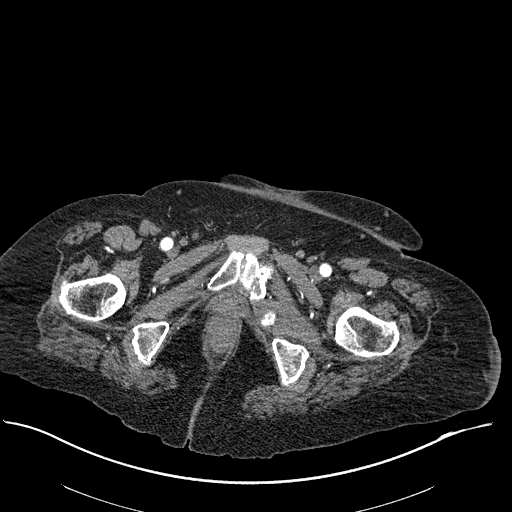
[im 54/215  soft-tissue]
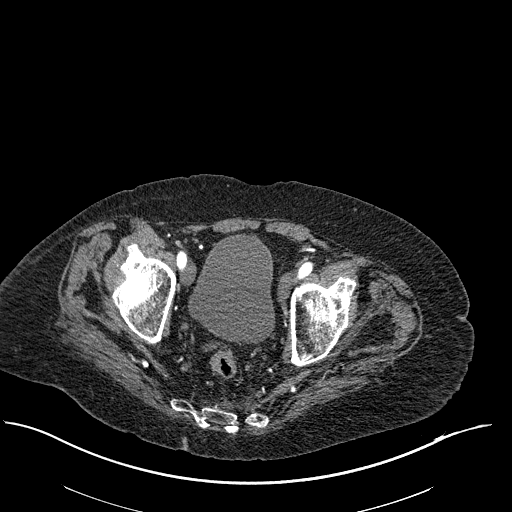
[im 72/215  soft-tissue]
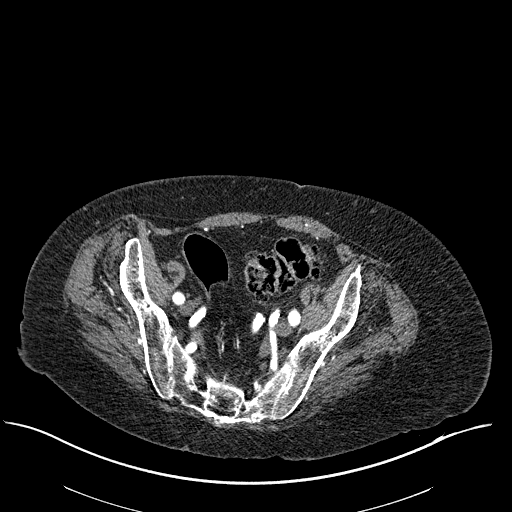
[im 90/215  soft-tissue]
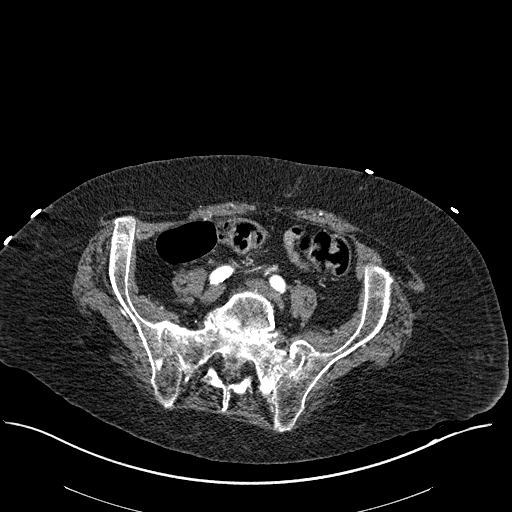
[im 125/215  soft-tissue]
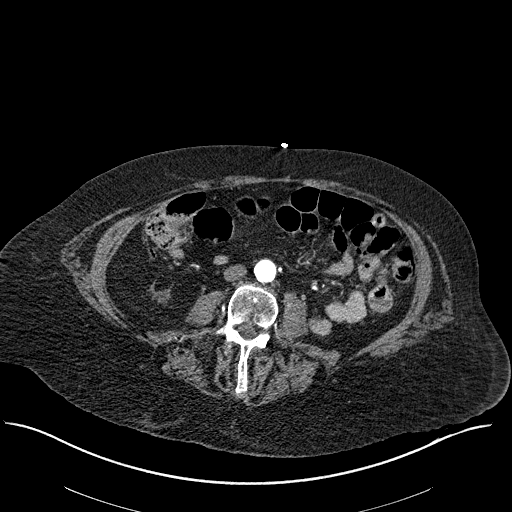
[im 143/215  soft-tissue]
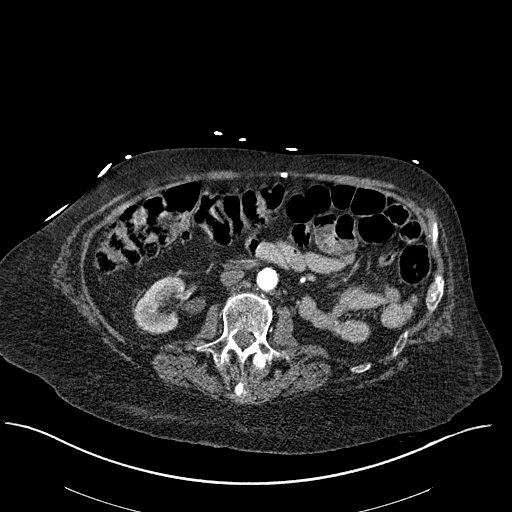
[im 161/215  soft-tissue]
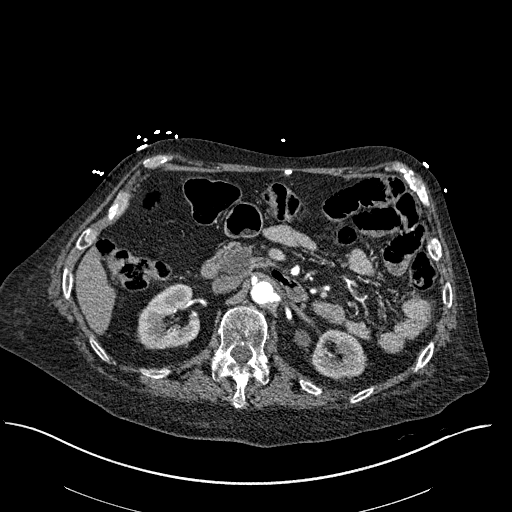

[Series 7: coronal arterial · coronal · arterial · 0.81mm/px · 2 of 145 slices shown, 3 images]
[im 49/145  soft-tissue]
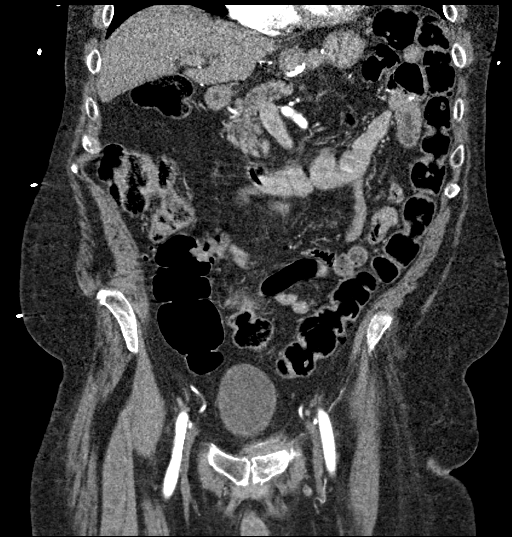
[im 49/145  bone]
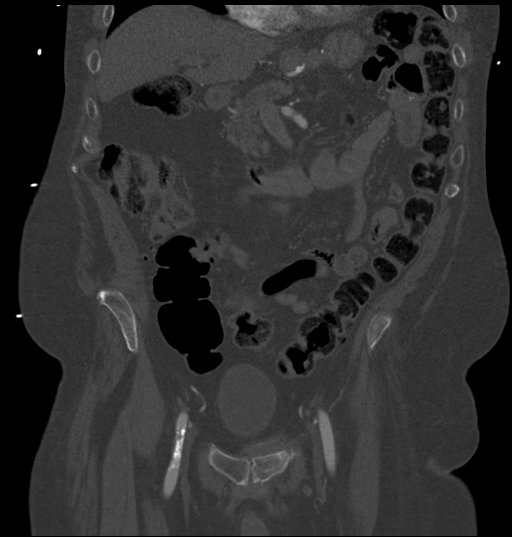
[im 97/145  soft-tissue]
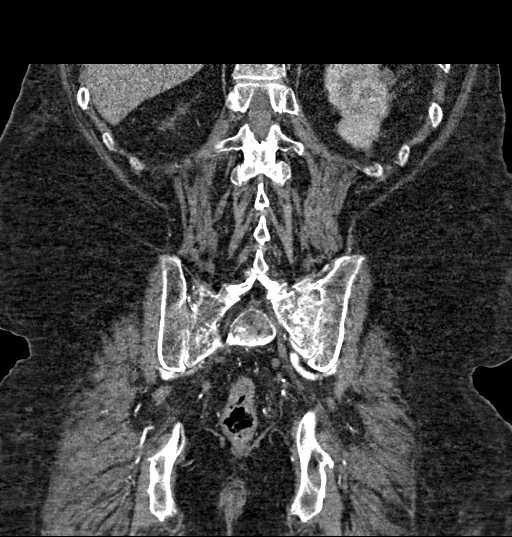

[Series 13: axial portal venous · axial · portal-venous · 0.85mm/px · z∈[-336,-126]mm · 3 of 86 slices shown, 7 images]
[im 22/86  soft-tissue]
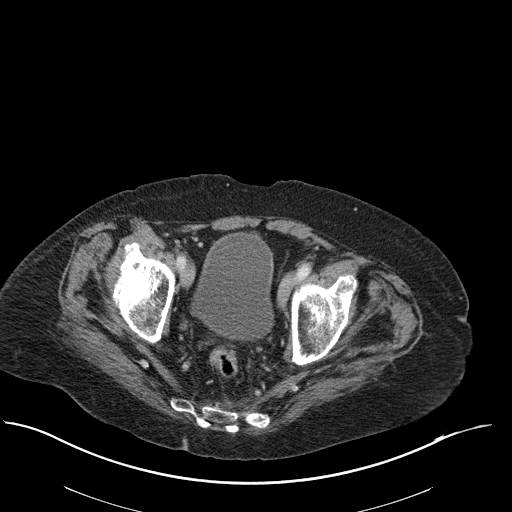
[im 22/86  lung]
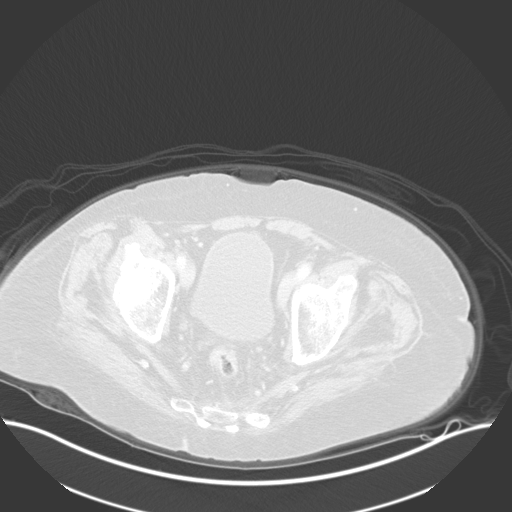
[im 22/86  bone]
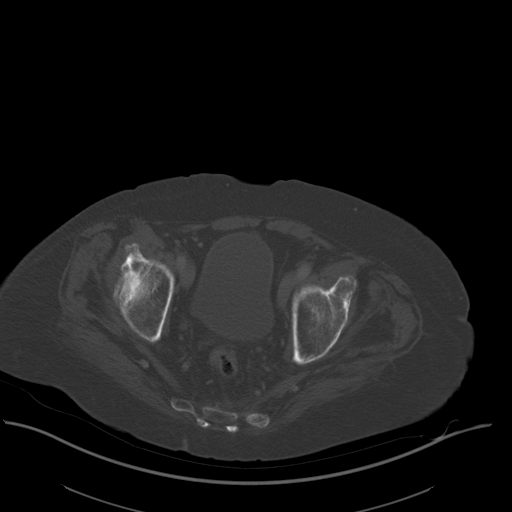
[im 43/86  soft-tissue]
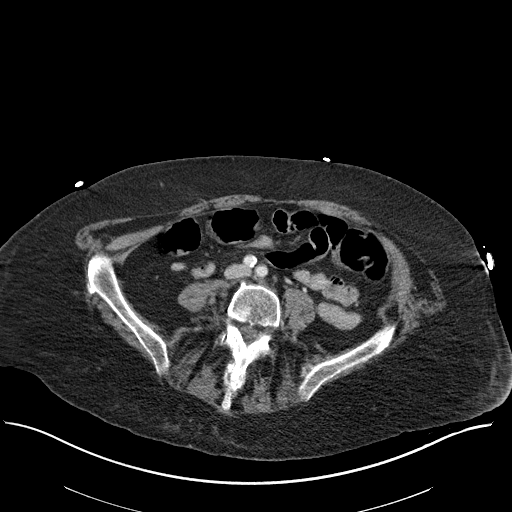
[im 43/86  lung]
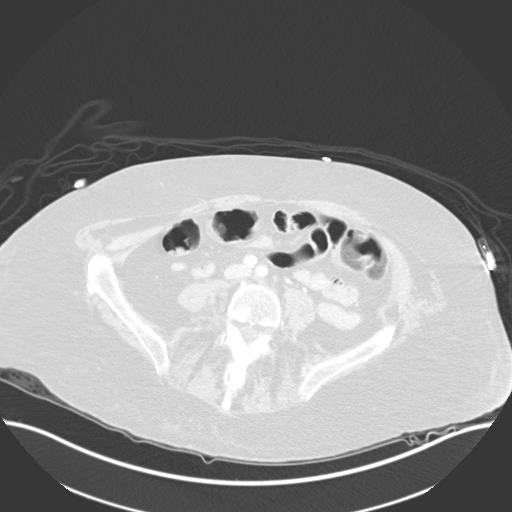
[im 64/86  soft-tissue]
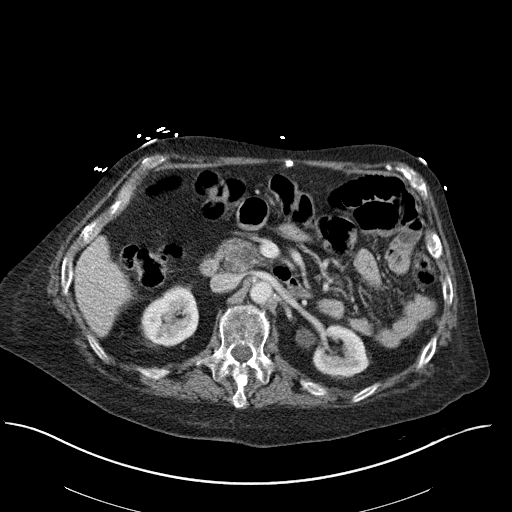
[im 64/86  lung]
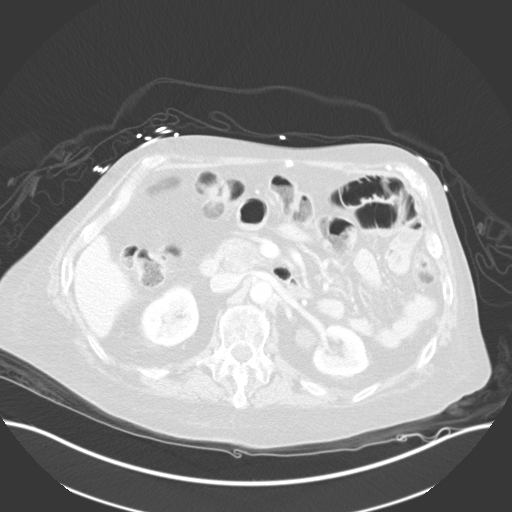

[12 of 46 positions shown; findings below may reference images not displayed]

FINDINGS: VASCULAR

Aorta: Infrarenal aorta has calcific atherosclerosis without
aneurysm, dissection, rupture or acute occlusive process.

Celiac: Atherosclerotic origin but remains patent including its
branches. No active bleeding in the celiac vascular territory

SMA: Atherosclerotic origin but remains patent including its
branches. No active bleeding in the SMA vascular territory.

Renals: Both main renal arteries appear widely patent. No accessory
renal artery.

IMA: Atherosclerotic origin but remains patent including its
branches. No active bleeding in the IMA vascular territory.

Inflow: Atherosclerosis of the iliac vessels without inflow disease
or occlusion. Common, internal, and external iliac arteries are
patent.

Proximal Outflow: Common femoral, proximal profunda femoral, and
proximal superficial femoral arteries demonstrated appear patent.

Veins: No Jumper process.

Review of the MIP images confirms the above findings.

NON-VASCULAR

Lower chest: Small non loculated pleural effusions. Associated
dependent bibasilar atelectasis. Normal heart size. Mitral valve
annulus calcifications noted. No pericardial effusion. Small hiatal
hernia noted.

Hepatobiliary: Remote cholecystectomy. Mild biliary prominence,
suspect post cholecystectomy related. No other significant focal
hepatic abnormality.

Pancreas: Hypodense cystic lesion in the pancreas head measures
cm. Similar indeterminate hypodense cystic area in the pancreas body
measures 1.5 cm. No associated ductal dilatation or pancreatic
atrophy. These cystic lesions are most compatible with chronic
pseudo cyst, branch duct IMPN, or small cyst adenomas. Recommend
follow-up MRI in 3-6 months.

Spleen: Normal in size without focal abnormality.

Adrenals/Urinary Tract: No adrenal abnormality. Small exophytic
hypodense renal cyst in the left kidney upper pole measures 1.3 cm.
No renal obstruction or hydronephrosis. No hydroureter or ureteral
calculus. Bladder unremarkable.

Stomach/Bowel: Postop changes from gastric bypass. Small bowel
anastomosis noted in the left abdomen. No significant dilatation,
ileus, obstruction pattern, or free air. Normal appendix
demonstrated. Distal colon diverticulosis noted without acute
inflammation.

No free fluid, fluid collection, hemorrhage, hematoma, abscess, or
ascites.

Lymphatic: No bulky adenopathy.

Reproductive: No significant finding by CT.

Other: No abdominal wall hernia or abnormality. No abdominopelvic
ascites.

Musculoskeletal: Degenerative changes noted of the spine. Bones are
osteopenic. Healing left rami fractures noted. Bilateral L5 pars
defects with chronic anterolisthesis of L5 upon S1 and associated
degenerative disc disease.
IMPRESSION: VASCULAR

No evidence of active arterial GI bleeding by CTA.

Abdominal atherosclerosis as detailed above.

No other acute intra-abdominopelvic vascular finding.

NON-VASCULAR

Indeterminate pancreatic head and body cystic lesions as detailed
above. Recommend follow-up nonemergent MRI in 3-6 months for further
characterization and to determine stability.

Small layering pleural effusions and basilar atelectasis

Postop changes of gastric bypass and cholecystectomy.

Diverticulosis without acute inflammatory process.

Healing left rami fractures

## 2021-04-13 ENCOUNTER — Other Ambulatory Visit: Payer: Self-pay | Admitting: Physician Assistant

## 2021-04-15 DIAGNOSIS — I10 Essential (primary) hypertension: Secondary | ICD-10-CM | POA: Diagnosis not present

## 2021-04-15 DIAGNOSIS — G8929 Other chronic pain: Secondary | ICD-10-CM | POA: Diagnosis not present

## 2021-04-15 DIAGNOSIS — C773 Secondary and unspecified malignant neoplasm of axilla and upper limb lymph nodes: Secondary | ICD-10-CM | POA: Diagnosis not present

## 2021-04-15 DIAGNOSIS — C7951 Secondary malignant neoplasm of bone: Secondary | ICD-10-CM | POA: Diagnosis not present

## 2021-04-15 DIAGNOSIS — M549 Dorsalgia, unspecified: Secondary | ICD-10-CM | POA: Diagnosis not present

## 2021-04-15 DIAGNOSIS — C7800 Secondary malignant neoplasm of unspecified lung: Secondary | ICD-10-CM | POA: Diagnosis not present

## 2021-04-16 DIAGNOSIS — M791 Myalgia, unspecified site: Secondary | ICD-10-CM | POA: Diagnosis not present

## 2021-04-16 DIAGNOSIS — C4359 Malignant melanoma of other part of trunk: Secondary | ICD-10-CM | POA: Diagnosis not present

## 2021-04-16 DIAGNOSIS — C439 Malignant melanoma of skin, unspecified: Secondary | ICD-10-CM | POA: Diagnosis not present

## 2021-04-16 DIAGNOSIS — C779 Secondary and unspecified malignant neoplasm of lymph node, unspecified: Secondary | ICD-10-CM | POA: Diagnosis not present

## 2021-04-16 DIAGNOSIS — H538 Other visual disturbances: Secondary | ICD-10-CM | POA: Diagnosis not present

## 2021-04-16 DIAGNOSIS — M545 Low back pain, unspecified: Secondary | ICD-10-CM | POA: Diagnosis not present

## 2021-04-16 DIAGNOSIS — E273 Drug-induced adrenocortical insufficiency: Secondary | ICD-10-CM | POA: Diagnosis not present

## 2021-04-16 DIAGNOSIS — C7931 Secondary malignant neoplasm of brain: Secondary | ICD-10-CM | POA: Diagnosis not present

## 2021-04-16 DIAGNOSIS — E230 Hypopituitarism: Secondary | ICD-10-CM | POA: Diagnosis not present

## 2021-04-17 DIAGNOSIS — C7951 Secondary malignant neoplasm of bone: Secondary | ICD-10-CM | POA: Diagnosis not present

## 2021-04-17 DIAGNOSIS — M549 Dorsalgia, unspecified: Secondary | ICD-10-CM | POA: Diagnosis not present

## 2021-04-17 DIAGNOSIS — C7800 Secondary malignant neoplasm of unspecified lung: Secondary | ICD-10-CM | POA: Diagnosis not present

## 2021-04-17 DIAGNOSIS — I10 Essential (primary) hypertension: Secondary | ICD-10-CM | POA: Diagnosis not present

## 2021-04-17 DIAGNOSIS — G8929 Other chronic pain: Secondary | ICD-10-CM | POA: Diagnosis not present

## 2021-04-17 DIAGNOSIS — C773 Secondary and unspecified malignant neoplasm of axilla and upper limb lymph nodes: Secondary | ICD-10-CM | POA: Diagnosis not present

## 2021-04-22 ENCOUNTER — Other Ambulatory Visit: Payer: Self-pay | Admitting: Internal Medicine

## 2021-04-22 DIAGNOSIS — C7800 Secondary malignant neoplasm of unspecified lung: Secondary | ICD-10-CM | POA: Diagnosis not present

## 2021-04-22 DIAGNOSIS — M549 Dorsalgia, unspecified: Secondary | ICD-10-CM | POA: Diagnosis not present

## 2021-04-22 DIAGNOSIS — C7951 Secondary malignant neoplasm of bone: Secondary | ICD-10-CM | POA: Diagnosis not present

## 2021-04-22 DIAGNOSIS — I10 Essential (primary) hypertension: Secondary | ICD-10-CM | POA: Diagnosis not present

## 2021-04-22 DIAGNOSIS — G8929 Other chronic pain: Secondary | ICD-10-CM | POA: Diagnosis not present

## 2021-04-22 DIAGNOSIS — C773 Secondary and unspecified malignant neoplasm of axilla and upper limb lymph nodes: Secondary | ICD-10-CM | POA: Diagnosis not present

## 2021-04-25 ENCOUNTER — Encounter: Payer: Self-pay | Admitting: Internal Medicine

## 2021-04-25 DIAGNOSIS — C7951 Secondary malignant neoplasm of bone: Secondary | ICD-10-CM | POA: Diagnosis not present

## 2021-04-25 DIAGNOSIS — C7800 Secondary malignant neoplasm of unspecified lung: Secondary | ICD-10-CM | POA: Diagnosis not present

## 2021-04-25 DIAGNOSIS — G8929 Other chronic pain: Secondary | ICD-10-CM | POA: Diagnosis not present

## 2021-04-25 DIAGNOSIS — C773 Secondary and unspecified malignant neoplasm of axilla and upper limb lymph nodes: Secondary | ICD-10-CM | POA: Diagnosis not present

## 2021-04-25 DIAGNOSIS — M549 Dorsalgia, unspecified: Secondary | ICD-10-CM | POA: Diagnosis not present

## 2021-04-25 DIAGNOSIS — I10 Essential (primary) hypertension: Secondary | ICD-10-CM | POA: Diagnosis not present

## 2021-04-25 NOTE — Telephone Encounter (Signed)
Called and advised pt refill was sent 04/22/21 for a 30 day supply. Follow up appt scheduled. ?

## 2021-04-28 ENCOUNTER — Other Ambulatory Visit: Payer: Self-pay

## 2021-04-28 ENCOUNTER — Encounter: Payer: Self-pay | Admitting: Internal Medicine

## 2021-04-28 ENCOUNTER — Ambulatory Visit (INDEPENDENT_AMBULATORY_CARE_PROVIDER_SITE_OTHER): Payer: Medicare Other | Admitting: Internal Medicine

## 2021-04-28 VITALS — BP 120/80 | HR 78 | Ht 71.0 in | Wt 170.8 lb

## 2021-04-28 DIAGNOSIS — M810 Age-related osteoporosis without current pathological fracture: Secondary | ICD-10-CM | POA: Diagnosis not present

## 2021-04-28 DIAGNOSIS — E273 Drug-induced adrenocortical insufficiency: Secondary | ICD-10-CM

## 2021-04-28 DIAGNOSIS — E038 Other specified hypothyroidism: Secondary | ICD-10-CM | POA: Diagnosis not present

## 2021-04-28 MED ORDER — LEVOTHYROXINE SODIUM 88 MCG PO TABS: ORAL_TABLET | ORAL | 3 refills | Status: DC

## 2021-04-28 NOTE — Progress Notes (Signed)
Patient ID: Maxwell Fox, male   DOB: Jun 16, 1945, 76 y.o.   MRN: [id number]  ? ?This visit occurred during the SARS-CoV-2 public health emergency.  Safety protocols were in place, including screening questions prior to the visit, additional usage of staff PPE, and extensive cleaning of exam room while observing appropriate contact time as indicated for disinfecting solutions.  ? ?HPI  ?Maxwell Fox is a 76 y.o.-year-old male, initially referred by his PCP, Dr. Quay Burow, returning for follow-up for central adrenal insufficiency,  central hypothyroidism, and clinical osteoporosis.  Last visit 6 months ago. ?Prev. Saw Dr. Dara Lords at [facility]. ? ?Interim history: ?His metastatic melanoma recurred recently - new lesions in proximal right tibia and right axillary lymph node (per review of the PET scan results  from 11/27/2020) >> treatment intensified. He had a lot of weakness and disequilibrium after his immune therapy was increased. He is now doing PT at home.  He is feeling better.  He will do outpt PT afterwards. ?He is doing well, without increased fatigue, GI symptoms, chest pain, shortness of breath.  He does have blurry vision. ?He fell 2x since last OV.  No fractures. ? 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 ?He is on levothyroxine 88 mcg daily: ?- in am ?- fasting ?- at least 60 min from b'fast ?- no Fe, PPIs ?- + MVI and calcium at 4 PM  ?- not on Biotin ? ?Reviewed patient's TFTs: ?01/28/2021: fT4 1.49 (0.82-1.77) ?Lab Results  ?Component Value Date  ? TSH 0.234 (L) 03/04/2020  ? TSH 0.53 08/18/2019  ? TSH 0.53 02/17/2019  ? TSH 0.550 08/16/2018  ? TSH 2.39 11/01/2014  ? FREET4 1.01 08/21/2020  ? FREET4 0.96 08/18/2019  ? FREET4 1.36 02/17/2019  ? T3FREE 3.8 08/21/2020  ? T3FREE 2.3 02/17/2019  ? ?Reviewed records from [place] ([place]): ? ? ?Antithyroid antibodies: ?No results  found for: THGAB ?No components found for: TPOAB ? ?Pt denies: ?- feeling nodules in neck ?- hoarseness ?- dysphagia ?- choking ?- SOB with lying down ? ?She has no FH of thyroid disorders. No FH of thyroid cancer. No h/o radiation tx to head or neck. ? ?No herbal supplements. No Biotin use.  ? ?Central adrenal insufficiency: ? ?His adrenal insufficiency was diagnosed in 2018, secondary to metastatic melanoma treatment. ? ? ? ?21-hydroxylase antibodies were not elevated: ? ? ?He is on prednisone 5 mg daily in the morning. He tried 3 and 6 mg >> he feels best on 5 mg. ? ?Osteoporosis: ?- In 01/2020, he had a closed fracture of the superior is ramus of left pubis after falling in his bathroom.  He is now pain-free. In 2022, he also had GIB 2/2 staples still left at the site of his previous gastric bypass surgery. He had a colonoscopy and 4 EGDs. He was weak and fell again several times due to persistent anemia.  He actually had a pelvic refracture while in house when they were turning him. ? ?We checked a bone density scan in 08/2020 and this showed osteoporosis: ?08/22/2020 ([place]) Lumbar spine L1-L4 (L2) Femoral neck (FN) 33% distal radius  ?T-score -0.8 RFN: -2.6 ?LFN: -2.2 n/a  ? ?I suggested treatment with Reclast, but at last visit I recommended to first discuss with his oncologist to see if he needs Zometa instead. ? ?Vitamin D level was normal at last visit: ?Lab Results  ?Component Value  Date  ? VD25OH 66.31 08/21/2020  ? VD25OH 44.63 01/07/2017  ? VD25OH 47.11 11/01/2014  ? ?He is on 1000 units vitamin D supplement daily. ? ?Of note, he has a history of R en Y gastric bypass in 2000 (initial weight : 350 lbs!),  HTN. ?He also had a TIA in 2018, but this appears to have resolved.  Sodium levels were reviewed and these were normal: ?Lab Results  ?Component Value Date  ? NA 141 04/01/2020  ? NA 143 03/29/2020  ? NA 138 03/28/2020  ? NA 141 03/27/2020  ? NA 141 03/04/2020  ? NA 139 03/03/2020  ? NA 137  02/27/2020  ? NA 141 02/26/2020  ? NA 140 02/26/2020  ? NA 141 02/20/2020  ?He has nocturia 1-2x a night -he takes Flomax. ? ?He moved from [place] to [place] to be near her daughter and her 3 children.  Daughter is going through a difficult divorce. ? ?ROS:+ see HPI ?+  fatigue, no subjective hyperthermia, no subjective hypothermia ?+ vitiligo hands and arms after immune treatment for his melanoma, also + easy bruising ? ?I reviewed pt's medications, allergies, PMH, social hx, family hx, and changes were documented in the history of present illness. Otherwise, unchanged from my initial visit note. ? ?Past Medical History:  ?Diagnosis Date  ? B12 nutritional deficiency   ? malabsorbtion  ? Blood transfusion without reported diagnosis 2022  ? BPH (benign prostatic hypertrophy)   ? Brain cancer [facility]) 2017  ? removed "golf ball sized mass"  ? Depression   ? on meds  ? Diverticulosis   ? GERD (gastroesophageal reflux disease)   ? on meds  ? GI bleeding 02/2020  ? Hypertension   ? Iron deficiency anemia   ? malabsorption related (s/p gastric bypass)  ? Melanoma ([place])   ? Obesity   ? s/p gastric bypass 2000, start 355#  ? Pancreatic cyst   ? Pulmonary nodules   ? Thyroid disease   ? on meds  ? ?Past Surgical History:  ?Procedure Laterality Date  ? CHOLECYSTECTOMY  2000  ? COLONOSCOPY WITH PROPOFOL N/A 03/05/2020  ? Procedure: COLONOSCOPY WITH PROPOFOL;  Surgeon: Mansouraty, Telford Nab., MD;  Location: [place];  Service: Gastroenterology;  Laterality: N/A;  ? CRANIOTOMY FOR TUMOR  12/11/2015  ? metastatic melanoma  ? ENTEROSCOPY N/A 03/30/2020  ? Procedure: ENTEROSCOPY;  Surgeon: Irene Shipper, MD;  Location: [facility];  Service: Endoscopy;  Laterality: N/A;  ? ESOPHAGOGASTRODUODENOSCOPY N/A 03/30/2020  ? Procedure: ESOPHAGOGASTRODUODENOSCOPY (EGD);  Surgeon: Irene Shipper, MD;  Location: [facility];  Service: Endoscopy;  Laterality: N/A;  ? ESOPHAGOGASTRODUODENOSCOPY (EGD) WITH PROPOFOL N/A 02/14/2020  ?  Procedure: ESOPHAGOGASTRODUODENOSCOPY (EGD) WITH PROPOFOL;  Surgeon: Lavena Bullion, DO;  Location: [facility];  Service: Endoscopy;  Laterality: N/A;  ? ESOPHAGOGASTRODUODENOSCOPY (EGD) WITH PROPOFOL N/A 02/26/2020  ? Procedure: ESOPHAGOGASTRODUODENOSCOPY (EGD) WITH PROPOFOL;  Surgeon: Irene Shipper, MD;  Location: [facility] ENDOSCOPY;  Service: Endoscopy;  Laterality: N/A;  ? ESOPHAGOGASTRODUODENOSCOPY (EGD) WITH PROPOFOL N/A 03/03/2020  ? Procedure: ESOPHAGOGASTRODUODENOSCOPY (EGD) WITH PROPOFOL;  Surgeon: Carol Ada, MD;  Location: [facility];  Service: Endoscopy;  Laterality: N/A;  ? ESOPHAGOGASTRODUODENOSCOPY (EGD) WITH PROPOFOL N/A 03/28/2020  ? Procedure: ESOPHAGOGASTRODUODENOSCOPY (EGD) WITH PROPOFOL;  Surgeon: Irene Shipper, MD;  Location: [facility];  Service: Endoscopy;  Laterality: N/A;  ? FOREIGN BODY REMOVAL  03/30/2020  ? Procedure: FOREIGN BODY REMOVAL;  Surgeon: Irene Shipper, MD;  Location: [facility];  Service: Endoscopy;;  ?  GIVENS CAPSULE STUDY N/A 03/28/2020  ? Procedure: GIVENS CAPSULE STUDY;  Surgeon: Irene Shipper, MD;  Location: [facility];  Service: Endoscopy;  Laterality: N/A;  ? HEMOSTASIS CONTROL  02/26/2020  ? Procedure: HEMOSTASIS CONTROL;  Surgeon: Irene Shipper, MD;  Location: [facility] ENDOSCOPY;  Service: Endoscopy;;  ? HEMOSTASIS CONTROL  03/30/2020  ? Procedure: HEMOSTASIS CONTROL;  Surgeon: Irene Shipper, MD;  Location: [facility];  Service: Endoscopy;;  ? HOT HEMOSTASIS N/A 02/26/2020  ? Procedure: HOT HEMOSTASIS (ARGON PLASMA COAGULATION/BICAP);  Surgeon: Irene Shipper, MD;  Location: [facility] END[facility]COPY;  Service: Endoscopy;  Laterality: N/A;  ? MOHS SURGERY  2012  ? melanoma, mid back, [place]  ? ROUX-EN-Y PROCEDURE  2000  ? TOTAL SHOULDER REPLACEMENT Right 2011  ? [place]  ? WISDOM TOOTH EXTRACTION    ? ?Social History  ? ?Socioeconomic History  ? Marital status: Married  ?  Spouse name: Not on file  ? Number of children: 2  ? Years of education: Not on file  ? Highest education  level: Not on file  ?Occupational History  ? retired  ?Tobacco Use  ? Smoking status: Former Smoker  ?  Quit date: 02/03/1963  ?  Years since quitting: 56.0  ? Smokeless tobacco: Never Used  ?Substance and Sexual Activity

## 2021-04-28 NOTE — Patient Instructions (Signed)
Please continue Levothyroxine 88 mcg daily. ? ?Take the thyroid hormone every day, with water, at least 30 minutes before breakfast, separated by at least 4 hours from: ?- acid reflux medications ?- calcium ?- iron ?- multivitamins ? ?Continue prednisone 5 mg daily. ? ?Please remember: ?- You absolutely need to take this medication every day and not skip doses. ?- Please double the dose if you have a fever, for the duration of the fever. ?- If you cannot take anything by mouth (vomiting) or you have severe diarrhea so that you eliminate the Prednisone pills in your stool, please make sure that you get the Prednisone in the vein instead - go to the nearest emergency department/urgent care or you may go to your PCPs office  ? ?Please come back for a follow-up appointment in 6 months. ?

## 2021-04-29 DIAGNOSIS — C773 Secondary and unspecified malignant neoplasm of axilla and upper limb lymph nodes: Secondary | ICD-10-CM | POA: Diagnosis not present

## 2021-04-29 DIAGNOSIS — I10 Essential (primary) hypertension: Secondary | ICD-10-CM | POA: Diagnosis not present

## 2021-04-29 DIAGNOSIS — G8929 Other chronic pain: Secondary | ICD-10-CM | POA: Diagnosis not present

## 2021-04-29 DIAGNOSIS — M549 Dorsalgia, unspecified: Secondary | ICD-10-CM | POA: Diagnosis not present

## 2021-04-29 DIAGNOSIS — C7800 Secondary malignant neoplasm of unspecified lung: Secondary | ICD-10-CM | POA: Diagnosis not present

## 2021-04-29 DIAGNOSIS — C7951 Secondary malignant neoplasm of bone: Secondary | ICD-10-CM | POA: Diagnosis not present

## 2021-05-01 DIAGNOSIS — G8929 Other chronic pain: Secondary | ICD-10-CM | POA: Diagnosis not present

## 2021-05-01 DIAGNOSIS — C773 Secondary and unspecified malignant neoplasm of axilla and upper limb lymph nodes: Secondary | ICD-10-CM | POA: Diagnosis not present

## 2021-05-01 DIAGNOSIS — M549 Dorsalgia, unspecified: Secondary | ICD-10-CM | POA: Diagnosis not present

## 2021-05-01 DIAGNOSIS — C7951 Secondary malignant neoplasm of bone: Secondary | ICD-10-CM | POA: Diagnosis not present

## 2021-05-01 DIAGNOSIS — I10 Essential (primary) hypertension: Secondary | ICD-10-CM | POA: Diagnosis not present

## 2021-05-01 DIAGNOSIS — C7800 Secondary malignant neoplasm of unspecified lung: Secondary | ICD-10-CM | POA: Diagnosis not present

## 2021-05-05 DIAGNOSIS — G8929 Other chronic pain: Secondary | ICD-10-CM | POA: Diagnosis not present

## 2021-05-05 DIAGNOSIS — I10 Essential (primary) hypertension: Secondary | ICD-10-CM | POA: Diagnosis not present

## 2021-05-05 DIAGNOSIS — C7951 Secondary malignant neoplasm of bone: Secondary | ICD-10-CM | POA: Diagnosis not present

## 2021-05-05 DIAGNOSIS — C773 Secondary and unspecified malignant neoplasm of axilla and upper limb lymph nodes: Secondary | ICD-10-CM | POA: Diagnosis not present

## 2021-05-05 DIAGNOSIS — M549 Dorsalgia, unspecified: Secondary | ICD-10-CM | POA: Diagnosis not present

## 2021-05-05 DIAGNOSIS — C7800 Secondary malignant neoplasm of unspecified lung: Secondary | ICD-10-CM | POA: Diagnosis not present

## 2021-05-15 DIAGNOSIS — M6281 Muscle weakness (generalized): Secondary | ICD-10-CM | POA: Diagnosis not present

## 2021-05-18 ENCOUNTER — Encounter: Payer: Self-pay | Admitting: Internal Medicine

## 2021-05-18 NOTE — Patient Instructions (Addendum)
? ? ? ? ?  Medications changes include : zofran for nausea as needed.  Decrease oxycodone to 10 mg to 4 hours a day   ? ? ?Your prescription(s) have been sent to your pharmacy.  ? ? ? ?Return in about 2 months (around 07/19/2021) for follow up. ? ?

## 2021-05-18 NOTE — Progress Notes (Signed)
? ? ? ? ?Subjective:  ? ? Patient ID: Maxwell Fox, male    DOB: [birth year], 76 y.o.   MRN: [id number] ? ?This visit occurred during the SARS-CoV-2 public health emergency.  Safety protocols were in place, including screening questions prior to the visit, additional usage of staff PPE, and extensive cleaning of exam room while observing appropriate contact time as indicated for disinfecting solutions.   ? ? ?HPI ?Maxwell Fox is here for follow up of his chronic medical problems, including chronic pain syndrome, anxiety.  He has metastatic melanoma - in remission.   ? ?Chronic pain -  He is currently taking oxycodone 10 mg 6 tabs / day.  Oncology was prescribing this, but now that his cancer is in remission and his pain is not oncology related they have referred him back to me.  ( Last filled 04/18/21 - 180 pills, 30 days supply)  He would like to try to come off the medication.  He was experiencing leg pain and initially rheumatology put him on a higher dose of prednisone, which has been tapered down to 5 mg daily which he will remain on.  Most likely his leg pain was actually due to his immunotherapy for the cancer.  He is no longer on that.  He currently denies any pain right now.  ? ?He is taking oxycodone 10 mg morning, 12, 4 pm and nighttime = 40 mg a day, sometimes takes an extra pill for mild withdrawal symptoms.  He does have a little nausea at times with the withdrawal symptoms.  Does that 50% of the time.   ? ?Anxiety -taking clonazepam bid prn for anxiety  - anxiety is improving. ? ? ?He has fallen twice.  He still has rib pain.  He is weak and has poor balance - finished home PT and doing outpatient PT now.   ? ?  ? ? ?Medications and allergies reviewed with patient and updated if appropriate. ? ?Current Outpatient Medications on File Prior to Visit  ?Medication Sig Dispense Refill  ? acetaminophen (TYLENOL) 325 MG tablet Take 2 tablets (650 mg total) by mouth every 6 (six) hours as needed for moderate pain.     ? calcium carbonate (OS-CAL - DOSED IN MG OF ELEMENTAL CALCIUM) 1250 (500 Ca) MG tablet Take 1 tablet by mouth daily with supper.    ? Cholecalciferol (VITAMIN D) 2000 UNITS tablet Take 1 tablet (2,000 Units total) by mouth daily. 30 tablet 11  ? clonazePAM (KLONOPIN) 1 MG tablet TAKE 1 TABLET(1 MG) BY MOUTH DAILY AS NEEDED FOR ANXIETY 30 tablet 2  ? escitalopram (LEXAPRO) 20 MG tablet Take 1 tablet (20 mg total) by mouth daily. 90 tablet 1  ? levothyroxine (SYNTHROID) 88 MCG tablet TAKE 1 TABLET(88 MCG) BY MOUTH DAILY BEFORE AND BREAKFAST 90 tablet 3  ? mometasone (ELOCON) 0.1 % cream Apply topically.    ? Multiple Vitamin (MULTIVITAMIN WITH MINERALS) TABS tablet Take 1 tablet by mouth daily.    ? nystatin (MYCOSTATIN) 100000 UNIT/ML suspension Take by mouth.    ? prednisoLONE acetate (PRED FORTE) 1 % ophthalmic suspension SMARTSIG:In Eye(s)    ? predniSONE (DELTASONE) 5 MG tablet TAKE 1 TABLET BY MOUTH EVERY DAY WITH BREAKFAST 30 tablet 0  ? tamsulosin (FLOMAX) 0.4 MG CAPS capsule TAKE 1 CAPSULE(0.4 MG) BY MOUTH DAILY AFTER AND SUPPER 90 capsule 1  ? ?No current facility-administered medications on file prior to visit.  ? ? ? ?Review of Systems  ?Respiratory:  Negative for cough,  shortness of breath and wheezing.   ?Cardiovascular:  Negative for chest pain, palpitations and leg swelling.  ?Gastrointestinal:  Negative for abdominal pain, blood in stool (no black stool), constipation and nausea.  ?     No gerd  ?Neurological:  Negative for light-headedness and headaches.  ?Psychiatric/Behavioral:  Negative for dysphoric mood. The patient is nervous/anxious.   ? ?   ?Objective:  ? ?Vitals:  ? 05/19/21 0921  ?BP: 130/74  ?Pulse: 69  ?Temp: (!) 97.4 ?F (36.3 ?C)  ?SpO2: 97%  ? ?BP Readings from Last 3 Encounters:  ?05/19/21 130/74  ?04/28/21 120/80  ?03/12/21 100/60  ? ?Wt Readings from Last 3 Encounters:  ?05/19/21 170 lb (77.1 kg)  ?04/28/21 170 lb 12.8 oz (77.5 kg)  ?03/12/21 180 lb 3.2 oz (81.7 kg)  ? ?Body mass  index is 23.71 kg/m?. ? ?  ?Physical Exam ?Constitutional:   ?   General: He is not in acute distress. ?   Appearance: Normal appearance. He is not ill-appearing.  ?HENT:  ?   Head: Normocephalic and atraumatic.  ?Eyes:  ?   Conjunctiva/sclera: Conjunctivae normal.  ?Cardiovascular:  ?   Rate and Rhythm: Normal rate and regular rhythm.  ?   Heart sounds: Normal heart sounds. No murmur heard. ?Pulmonary:  ?   Effort: Pulmonary effort is normal. No respiratory distress.  ?   Breath sounds: Normal breath sounds. No wheezing or rales.  ?Musculoskeletal:  ?   Right lower leg: Edema (1+ pitting) present.  ?   Left lower leg: Edema (1+ pitting) present.  ?Skin: ?   General: Skin is warm and dry.  ?   Findings: No rash.  ?Neurological:  ?   Mental Status: He is alert. Mental status is at baseline.  ?Psychiatric:     ?   Mood and Affect: Mood normal.  ? ?   ? ?Lab Results  ?Component Value Date  ? WBC 7.1 05/14/2020  ? HGB 12.1 (L) 05/14/2020  ? HCT 37.4 (L) 05/14/2020  ? PLT 185.0 05/14/2020  ? GLUCOSE 74 04/01/2020  ? CHOL 195 01/07/2017  ? TRIG 152.0 (H) 01/07/2017  ? HDL 73.20 01/07/2017  ? [place] 91 01/07/2017  ? ALT 14 03/27/2020  ? AST 24 03/27/2020  ? NA 141 04/01/2020  ? K 3.6 04/01/2020  ? CL 108 04/01/2020  ? CREATININE 0.95 04/01/2020  ? BUN 13 04/01/2020  ? CO2 27 04/01/2020  ? TSH 0.234 (L) 03/04/2020  ? INR 1.2 02/26/2020  ? HGBA1C 4.7 (L) 02/14/2020  ? ? ? ?Assessment & Plan:  ? ? ?See Problem List for Assessment and Plan of chronic medical problems.  ? ? ?

## 2021-05-19 ENCOUNTER — Ambulatory Visit (INDEPENDENT_AMBULATORY_CARE_PROVIDER_SITE_OTHER): Payer: Medicare Other | Admitting: Internal Medicine

## 2021-05-19 VITALS — BP 130/74 | HR 69 | Temp 97.4°F | Ht 71.0 in | Wt 170.0 lb

## 2021-05-19 DIAGNOSIS — G894 Chronic pain syndrome: Secondary | ICD-10-CM | POA: Diagnosis not present

## 2021-05-19 DIAGNOSIS — F32A Depression, unspecified: Secondary | ICD-10-CM

## 2021-05-19 DIAGNOSIS — F419 Anxiety disorder, unspecified: Secondary | ICD-10-CM

## 2021-05-19 MED ORDER — ONDANSETRON HCL 4 MG PO TABS: 4.0000 mg | ORAL_TABLET | Freq: Three times a day (TID) | ORAL | 0 refills | Status: DC | PRN

## 2021-05-19 MED ORDER — OXYCODONE HCL 10 MG PO TABS: 10.0000 mg | ORAL_TABLET | Freq: Four times a day (QID) | ORAL | 0 refills | Status: DC | PRN

## 2021-05-19 MED ORDER — OMEPRAZOLE 40 MG PO CPDR: 40.0000 mg | DELAYED_RELEASE_CAPSULE | Freq: Every day | ORAL | 0 refills | Status: DC

## 2021-05-19 NOTE — Assessment & Plan Note (Signed)
Chronic ?Has been on opioids for a long time and at this point he no longer needs them ?He does have withdrawal symptoms when he starts to decrease the dose ?We will decrease his dose slowly and the goal would be to get him off opioids completely ?Currently taking oxycodone 10 mg 4 times a day and 50% of the time taking an extra 10 mg if needed ?Decrease oxycodone to 10 mg 4 times a day.  Advised he cannot take any extra doses.  Discussed with him that if he takes more medication than prescribed for the month he will not be able to fill his prescription early and he will have withdrawal symptoms-he understands this ?We will continue this dosing for 2 months-return in 2 months and at which time we will reevaluate and hopefully be able to decrease his dose ?Zofran 4 mg p.o. every 8 hours as needed for nausea ?

## 2021-05-19 NOTE — Assessment & Plan Note (Signed)
Chronic ?Controlled-at this time both anxiety and depression are controlled ?Continue Lexapro 20 mg daily ?Continue clonazepam 1 mg daily as needed for anxiety ?

## 2021-05-20 DIAGNOSIS — H2000 Unspecified acute and subacute iridocyclitis: Secondary | ICD-10-CM | POA: Diagnosis not present

## 2021-05-21 DIAGNOSIS — M6281 Muscle weakness (generalized): Secondary | ICD-10-CM | POA: Diagnosis not present

## 2021-05-23 DIAGNOSIS — M6281 Muscle weakness (generalized): Secondary | ICD-10-CM | POA: Diagnosis not present

## 2021-05-27 DIAGNOSIS — M6281 Muscle weakness (generalized): Secondary | ICD-10-CM | POA: Diagnosis not present

## 2021-05-28 DIAGNOSIS — L309 Dermatitis, unspecified: Secondary | ICD-10-CM | POA: Diagnosis not present

## 2021-05-28 DIAGNOSIS — Z08 Encounter for follow-up examination after completed treatment for malignant neoplasm: Secondary | ICD-10-CM | POA: Diagnosis not present

## 2021-05-28 DIAGNOSIS — L814 Other melanin hyperpigmentation: Secondary | ICD-10-CM | POA: Diagnosis not present

## 2021-05-28 DIAGNOSIS — Z85820 Personal history of malignant melanoma of skin: Secondary | ICD-10-CM | POA: Diagnosis not present

## 2021-05-28 DIAGNOSIS — D225 Melanocytic nevi of trunk: Secondary | ICD-10-CM | POA: Diagnosis not present

## 2021-05-28 DIAGNOSIS — L821 Other seborrheic keratosis: Secondary | ICD-10-CM | POA: Diagnosis not present

## 2021-05-30 DIAGNOSIS — M6281 Muscle weakness (generalized): Secondary | ICD-10-CM | POA: Diagnosis not present

## 2021-06-01 ENCOUNTER — Encounter: Payer: Self-pay | Admitting: Internal Medicine

## 2021-06-02 ENCOUNTER — Other Ambulatory Visit: Payer: Self-pay | Admitting: Internal Medicine

## 2021-06-02 DIAGNOSIS — M6281 Muscle weakness (generalized): Secondary | ICD-10-CM | POA: Diagnosis not present

## 2021-06-09 DIAGNOSIS — M6281 Muscle weakness (generalized): Secondary | ICD-10-CM | POA: Diagnosis not present

## 2021-06-13 DIAGNOSIS — M6281 Muscle weakness (generalized): Secondary | ICD-10-CM | POA: Diagnosis not present

## 2021-06-16 ENCOUNTER — Encounter: Payer: Self-pay | Admitting: Internal Medicine

## 2021-06-16 DIAGNOSIS — M6281 Muscle weakness (generalized): Secondary | ICD-10-CM | POA: Diagnosis not present

## 2021-06-16 MED ORDER — OXYCODONE HCL 10 MG PO TABS
10.0000 mg | ORAL_TABLET | Freq: Four times a day (QID) | ORAL | 0 refills | Status: DC | PRN
Start: 2021-06-16 — End: 2021-07-16

## 2021-06-20 ENCOUNTER — Telehealth: Payer: Self-pay | Admitting: *Deleted

## 2021-06-20 DIAGNOSIS — M6281 Muscle weakness (generalized): Secondary | ICD-10-CM | POA: Diagnosis not present

## 2021-06-20 NOTE — Telephone Encounter (Signed)
Called patient and left message for patient to call office to schedule his medicare AWV.   Last AWV 12/10/2020

## 2021-06-24 DIAGNOSIS — M6281 Muscle weakness (generalized): Secondary | ICD-10-CM | POA: Diagnosis not present

## 2021-06-27 DIAGNOSIS — M6281 Muscle weakness (generalized): Secondary | ICD-10-CM | POA: Diagnosis not present

## 2021-07-01 DIAGNOSIS — M6281 Muscle weakness (generalized): Secondary | ICD-10-CM | POA: Diagnosis not present

## 2021-07-04 DIAGNOSIS — M6281 Muscle weakness (generalized): Secondary | ICD-10-CM | POA: Diagnosis not present

## 2021-07-07 DIAGNOSIS — M6281 Muscle weakness (generalized): Secondary | ICD-10-CM | POA: Diagnosis not present

## 2021-07-08 ENCOUNTER — Other Ambulatory Visit: Payer: Self-pay | Admitting: Internal Medicine

## 2021-07-08 ENCOUNTER — Other Ambulatory Visit: Payer: Self-pay | Admitting: *Deleted

## 2021-07-08 NOTE — Patient Outreach (Signed)
Gayna Braddy C. Uma Jerde, MSN, GNP-BC ?Gerontological Nurse Practitioner ?[facility] ?[phone] ? ?

## 2021-07-09 MED ORDER — ONDANSETRON HCL 4 MG PO TABS
4.0000 mg | ORAL_TABLET | Freq: Three times a day (TID) | ORAL | 0 refills | Status: DC | PRN
Start: 2021-07-09 — End: 2021-10-14

## 2021-07-09 NOTE — Patient Outreach (Signed)
[facility]) Care Management  07/09/2021  Maxwell Fox 25-Apr-1945 [id number]  Second outreach call  (first was yesterday 07/08/21, left a message).  T 07/09/2021 Name:  Maxwell Fox MRN:  [id number] DOB:  31-Jul-1945  Summary: Opiod dependence, difficulty with dose reduction. Cancer f/u end of the month.  Recommendations/Changes made from today's visit: NP requests pt to advise of scan results.  Subjective: Maxwell Fox is an 76 y.o. year old male who is a primary patient of Burns, [name], MD. The care management team was consulted for assistance with care management and/or care coordination needs.    Geriatric Nurse Practitioner completed Telephone Visit today.   Patient Active Problem List   Diagnosis Date Noted   Age-related osteoporosis without current pathological fracture  04/28/2021   Chronic pain syndrome 08/24/2020   History of Roux-en-Y gastric bypass    Acute blood loss anemia 02/14/2020   H/O: upper GI bleed 02/14/2020   Gastroesophageal reflux disease with esophagitis without hemorrhage    Closed fracture of left superior pubic ramus (HCC) 02/09/2020   Anxiety and depression 01/10/2018   Chronic midline low back pain without sciatica 04/19/2017   Adrenal insufficiency due to cancer therapy ([place] [facility]) 07/27/2016   Secondary hypothyroidism 07/27/2016   H/O gastric bypass 01/06/2016   Pancreatic cyst 01/06/2016   Metastatic melanoma to lymph node (HCC)-[facility] 12/31/2015   Iron deficiency anemia    B12 nutritional deficiency    Benign prostatic hyperplasia    Outpatient Encounter Medications as of 07/08/2021  Medication Sig   acetaminophen (TYLENOL) 325 MG tablet Take 2 tablets (650 mg total) by mouth every 6 (six) hours as needed for moderate pain.   calcium carbonate (OS-CAL - DOSED IN MG OF ELEMENTAL CALCIUM) 1250 (500 Ca) MG tablet Take 1 tablet by mouth daily with supper.   Cholecalciferol (VITAMIN D) 2000 UNITS tablet Take 1 tablet (2,000  Units total) by mouth daily.   clonazePAM (KLONOPIN) 1 MG tablet TAKE 1 TABLET(1 MG) BY MOUTH TWICE DAILY AS NEEDED FOR ANXIETY   escitalopram (LEXAPRO) 20 MG tablet Take 1 tablet (20 mg total) by mouth daily.   levothyroxine (SYNTHROID) 88 MCG tablet TAKE 1 TABLET(88 MCG) BY MOUTH DAILY BEFORE AND BREAKFAST   mometasone (ELOCON) 0.1 % cream Apply topically.   Multiple Vitamin (MULTIVITAMIN WITH MINERALS) TABS tablet Take 1 tablet by mouth daily.   nystatin (MYCOSTATIN) 100000 UNIT/ML suspension Take by mouth.   omeprazole (PRILOSEC) 40 MG capsule Take 1 capsule (40 mg total) by mouth daily. **PLEASE CONTACT OFFICE TO SCHEDULE FOLLOW UP   ondansetron (ZOFRAN) 4 MG tablet Take 1 tablet (4 mg total) by mouth every 8 (eight) hours as needed for nausea or vomiting.   Oxycodone HCl 10 MG TABS Take 1 tablet (10 mg total) by mouth every 6 (six) hours as needed (chronic pain in legs).   prednisoLONE acetate (PRED FORTE) 1 % ophthalmic suspension SMARTSIG:In Eye(s)   predniSONE (DELTASONE) 5 MG tablet TAKE 1 TABLET BY MOUTH EVERY DAY WITH BREAKFAST   tamsulosin (FLOMAX) 0.4 MG CAPS capsule TAKE 1 CAPSULE(0.4 MG) BY MOUTH DAILY AFTER AND SUPPER   [DISCONTINUED] clonazePAM (KLONOPIN) 1 MG tablet TAKE 1 TABLET(1 MG) BY MOUTH DAILY AS NEEDED FOR ANXIETY   No facility-administered encounter medications on file as of 07/08/2021.   Care Plan  Review of patient past medical history, allergies, medications, health status, including review of consultants reports, laboratory and other test data, was performed as part of comprehensive evaluation for care management  services.   Care Plan : RN Care Manager Plan of Care  Updates made by Deloria Lair, NP since 07/09/2021 12:00 AM     Problem: Metastatic Melanoma (palliative interventions)   Priority: High  Onset Date: 02/07/2021     Long-Range Goal: Patient states he wants to live and will do all he can to do so over the next 6 months.   Start Date: 02/07/2021   Expected End Date: 07/08/2021  This Visit's Progress: On track  Recent Progress: On track  Priority: High  Note:    Update 07/09/21:  (Status: Goal on Track (progressing): YES.) Long Term Goal  Evaluation of current treatment plan related to LIVING and patient's adherence to plan as established by provider Mr. Heber  reports he is doing fair. He will have scans at the end of the month so see the status of his hx metastatic melanoma. He is hopeful. Encouraged him to make a To Do list of pleasurable things he wants to do and start marking things off. Get the best of each day. Be mindful to stay positive.  Update 04/07/21:  (Status: Goal on Track (progressing): YES.) Long Term Goal  Evaluation of current treatment plan related to Metastatic Melanoma (in remission at this time) and patient's adherence to plan as established by provider. Pt reports he is only under observation at this time. He will be seen every 3 months for continued surveillance. Advised to follow his oncology schedule and to call if he has any related problems to discuss.   UPDATE 03/13/21:  (Status: Goal on Track (progressing): YES.) Long Term Goal  Evaluation of current treatment plan related to PAIN MANAGEMENT AND QUALITY OF LIFE and patient's adherence to plan as established by provider [name] HAS BEEN TAPERED DOWN TO 5 MG PREDNISONE FROM 40 MG OVER  MONTH. HE IS NOT DOING WELL ON THIS DOSE. WE DISCUSSED PERHAPS ALTERING 5 MG WITH 7.5 MG FOR SEVERAL WEEKS AND THEN TRYING TO GO BACK TO 5MG. HE CONTINUES TO BE ON OXYCONTIN 5 MG QID. HE IS GOING TO RUN OUT BEFORE HIS NEXT PAIN MANAGEMENT APPT. HE HAS BEEN WITTING A LETTER TO DR. Karsten Fells TO SEE IF HE WILL WORK WITH HIM TO GET HIS PAIN LEVEL TO A SATISFACTORY LEVEL.  Current Barriers:  Disease process  RNCM Clinical Goal(s):  Patient will verbalize basic understanding of his metastatic melanoma disease process and self health management plan as evidenced by discussions with NP every  month over the next 3 months  through collaboration with RN Care manager, provider, and care team.   Interventions: Inter-disciplinary care team collaboration (see longitudinal plan of care) Evaluation of current treatment plan related to  self management and patient's adherence to plan as established by provider   Patient Goals/Self-Care Activities: Take medications as prescribed   Attend all scheduled provider appointments Call pharmacy for medication refills 3-7 days in advance of running out of medications Perform all self care activities independently  Perform IADL's (shopping, preparing meals, housekeeping, managing finances) independently Call provider office for new concerns or questions       Problem: Falls   Priority: High  Onset Date: 04/07/2021     Long-Range Goal: Patient will not fall and sustain an injury requiring a hospitalization over the next 3 months.   Start Date: 04/07/2021  Expected End Date: 07/08/2021  This Visit's Progress: On track  Priority: High  Note:    Update 07/09/21:  (Status: Goal on Track (progressing): YES.) Long Term Goal  Evaluation of current treatment plan related to Fall prevention and patient's adherence to plan as established by provider No reported falls over the last 3 months. Reinforced fall precautions.  Current Barriers:  Falls Not following fall precautions.  RNCM Clinical Goal(s):  Patient will demonstrate improved adherence to prescribed treatment plan for FALL PREVENTION as evidenced by NO ED VISIT FOR FALL INJURY. work with [place] [facility] [place] as evidenced by PT REPORT AND CHART REVIEW.  through collaboration with RN Care manager, provider, and care team.   Interventions: Inter-disciplinary care team collaboration (see longitudinal plan of care) Evaluation of current treatment plan related to  self management and patient's adherence to plan as established by provider Reviewed current practice to avoid  falls: not using any ambulatory assistance when he fell at hospital. Reports he is using 2 canes at home, one for each side. Advised that strategy is not recommended. Advised he should be using his walker (HIS BEST FRIEND) to avoid falls. Arise slowly and stand for a minute before walking. Be aware of your surroundings. Choose best way or methods of least risk when moving around. Move obstacles in home. Pt to get started with another round of PT.  Patient Goals/Self-Care Activities: Work with PT to reach goals over time prescribed services are ordered.      Problem: Dependence on narcotic pain medication   Priority: Medium  Onset Date: 04/07/2021     Long-Range Goal: Patient will ween off oxycodone over the next 2 months as reported by pt and noted in chart review of prescribing MDs.   Start Date: 04/07/2021  Expected End Date: 07/02/2021  This Visit's Progress: Not on track  Priority: Medium  Note:    Update 07/09/21:  (Status: Goal Not Met.) Long Term Goal  Evaluation of current treatment plan related to REDUCTION OF OXYCODONE USE and patient's adherence to plan as established by provider Pt reports he is having a bear of a time trying to wean off this medication. He was supposed to be weaning down to 4 pills/day but wasn't able to do it. He discloses that he will run out and be without any for 8 days before he sees his prescribing MD. NP will advise prescriber although pt requests not to do so. NP will to avoid complications for the pt.  Current Barriers:  Chronic Disease Management support and education needs related to chronic pain and drug dependence  RNCM Clinical Goal(s):  Patient will take all medications exactly as prescribed and will call provider for medication related questions as evidenced by pt report.    attend all scheduled medical appointments: Narcotic prescribing MD as evidenced by pt and chart review.        demonstrate improved health management independence as evidenced  by Pt report of his drug weaning schedule and chart review.        work with Prescribing MD to work out a schedule for drug cessation as evidenced by pt report and chart review.  through collaboration with RN Care manager, provider, and care team.   Interventions: Inter-disciplinary care team collaboration (see longitudinal plan of care) Evaluation of current treatment plan related to  self management and patient's adherence to plan as established by provider       Patient has hx of pelvic fracture in 2022 and stated taking oxycodone at that time. He had weaned himself down but still taking an occasional tablet and then his cancer returned and he had immunotherapy which caused a cascade  of problems including pain. He was restarted on a new regimen of oxycodone and he had started to wean himself recently from 6 pills a day to 5 pills a day. He recently had a fall in the oncology area. He knees gave out and he sustained bumps and bruises. He was evaluated and deemed appropriate to go home. He reports today on our call that he is really sore and disappointed that now he has more pain again and is worried that he will run out of pain medication before he can wean himself down again. Oncologist has said she would only provide an RX for another 30 days and then he will have to get future refills from his primary care provider or from pain management. We discussed the need to cut down and we discussed trying to move to taking on 3 oxycodone a day with a dose of APAP 1000 mg in between. After a week or two he can go to 2 tabs a day with 3 doses of APAP 1076m daily and on until he is off the drub. He states he understands he needs to do this and wants to but acknowledges it is very hard to wean down.  Patient Goals/Self-Care Activities: Take medications as prescribed   Attend all scheduled provider appointments        Plan: Telephone follow up appointment with care management team member scheduled for:  3  months. Pt agrees to ongoing plan of care.  CEulah Pont SMyrtie Neither MSN, GEast [facility] Urology Center Dba UrocenterGerontological Nurse Practitioner TEnd[facility] JerseyCare Management [phone]

## 2021-07-15 ENCOUNTER — Encounter: Payer: Self-pay | Admitting: Internal Medicine

## 2021-07-16 MED ORDER — OXYCODONE HCL 10 MG PO TABS: 10.0000 mg | ORAL_TABLET | Freq: Four times a day (QID) | ORAL | 0 refills | Status: DC | PRN

## 2021-07-17 ENCOUNTER — Encounter: Payer: Self-pay | Admitting: Internal Medicine

## 2021-07-17 NOTE — Patient Instructions (Addendum)
     Medications changes include :  none       Return in about 3 months (around 10/18/2021) for follow up.

## 2021-07-17 NOTE — Progress Notes (Signed)
Subjective:    Patient ID: Maxwell Fox, male    DOB: [birth year], 76 y.o.   MRN: [id number]     HPI Maxwell Fox is here for follow up of his chronic medical problems, including  chronic pain syndrome, anxiety, depression, metastatic melanoma in remission.   At his last visit he was taking oxycodone 10 mg am, 12 pm, 4 pm and HS  Some days he has a hard time just taking 4 pills a day and may take an extra pill.    [place] controlled substance database checked 07/17/21 - oxycodone last filled 06/17/21.    He is taking three pills of oxycodone a day.  He started this 4 days ago.  He decided that he needed to do this and it was more mental and in his brain than anything.  So far he has been doing pretty good with this.  He is determined to get off the medication.  He denies depression, but gets anxiety.    Has intermittent nausea and zofran helps.    He is working out and going to PT.      Medications and allergies reviewed with patient and updated if appropriate.  Current Outpatient Medications on File Prior to Visit  Medication Sig Dispense Refill   acetaminophen (TYLENOL) 325 MG tablet Take 2 tablets (650 mg total) by mouth every 6 (six) hours as needed for moderate pain.     calcium carbonate (OS-CAL - DOSED IN MG OF ELEMENTAL CALCIUM) 1250 (500 Ca) MG tablet Take 1 tablet by mouth daily with supper.     Cholecalciferol (VITAMIN D) 2000 UNITS tablet Take 1 tablet (2,000 Units total) by mouth daily. 30 tablet 11   clonazePAM (KLONOPIN) 1 MG tablet TAKE 1 TABLET(1 MG) BY MOUTH TWICE DAILY AS NEEDED FOR ANXIETY 60 tablet 0   escitalopram (LEXAPRO) 20 MG tablet Take 1 tablet (20 mg total) by mouth daily. 90 tablet 1   levothyroxine (SYNTHROID) 88 MCG tablet TAKE 1 TABLET(88 MCG) BY MOUTH DAILY BEFORE AND BREAKFAST 90 tablet 3   mometasone (ELOCON) 0.1 % cream Apply topically.     Multiple Vitamin (MULTIVITAMIN WITH MINERALS) TABS tablet Take 1 tablet by mouth daily.     nystatin  (MYCOSTATIN) 100000 UNIT/ML suspension Take by mouth.     omeprazole (PRILOSEC) 40 MG capsule Take 1 capsule (40 mg total) by mouth daily. **PLEASE CONTACT OFFICE TO SCHEDULE FOLLOW UP 90 capsule 0   ondansetron (ZOFRAN) 4 MG tablet Take 1 tablet (4 mg total) by mouth every 8 (eight) hours as needed for nausea or vomiting. 20 tablet 0   Oxycodone HCl 10 MG TABS Take 1 tablet (10 mg total) by mouth every 6 (six) hours as needed (chronic pain in legs). 120 tablet 0   prednisoLONE acetate (PRED FORTE) 1 % ophthalmic suspension SMARTSIG:In Eye(s)     predniSONE (DELTASONE) 5 MG tablet TAKE 1 TABLET BY MOUTH EVERY DAY WITH BREAKFAST 90 tablet 3   tamsulosin (FLOMAX) 0.4 MG CAPS capsule TAKE 1 CAPSULE(0.4 MG) BY MOUTH DAILY AFTER AND SUPPER 90 capsule 1   triamcinolone cream (KENALOG) 0.1 % as needed.     No current facility-administered medications on file prior to visit.     Review of Systems     Objective:   Vitals:   07/18/21 0957  BP: 128/72  Pulse: 96  Resp: 20  Temp: 98 F (36.7 C)  SpO2: 97%   BP Readings from Last 3 Encounters:  07/18/21 128/72  05/19/21 130/74  04/28/21 120/80   Wt Readings from Last 3 Encounters:  07/18/21 176 lb 9.6 oz (80.1 kg)  05/19/21 170 lb (77.1 kg)  04/28/21 170 lb 12.8 oz (77.5 kg)   Body mass index is 24.63 kg/m.    Physical Exam Constitutional:      General: He is not in acute distress.    Appearance: Normal appearance.  HENT:     Head: Normocephalic and atraumatic.  Neurological:     Mental Status: He is alert. Mental status is at baseline.  Psychiatric:        Mood and Affect: Mood normal.        Behavior: Behavior normal.        Thought Content: Thought content normal.        Judgment: Judgment normal.        Lab Results  Component Value Date   WBC 7.1 05/14/2020   HGB 12.1 (L) 05/14/2020   HCT 37.4 (L) 05/14/2020   PLT 185.0 05/14/2020   GLUCOSE 74 04/01/2020   CHOL 195 01/07/2017   TRIG 152.0 (H) 01/07/2017   HDL  73.20 01/07/2017   LDLCALC 91 01/07/2017   ALT 14 03/27/2020   AST 24 03/27/2020   NA 141 04/01/2020   K 3.6 04/01/2020   CL 108 04/01/2020   CREATININE 0.95 04/01/2020   BUN 13 04/01/2020   CO2 27 04/01/2020   TSH 0.234 (L) 03/04/2020   INR 1.2 02/26/2020   HGBA1C 4.7 (L) 02/14/2020     Assessment & Plan:    See Problem List for Assessment and Plan of chronic medical problems.

## 2021-07-18 ENCOUNTER — Ambulatory Visit (INDEPENDENT_AMBULATORY_CARE_PROVIDER_SITE_OTHER): Payer: Medicare Other | Admitting: Internal Medicine

## 2021-07-18 VITALS — BP 128/72 | HR 96 | Temp 98.0°F | Resp 20 | Ht 71.0 in | Wt 176.6 lb

## 2021-07-18 DIAGNOSIS — G894 Chronic pain syndrome: Secondary | ICD-10-CM

## 2021-07-18 DIAGNOSIS — F32A Depression, unspecified: Secondary | ICD-10-CM

## 2021-07-18 DIAGNOSIS — F419 Anxiety disorder, unspecified: Secondary | ICD-10-CM | POA: Diagnosis not present

## 2021-07-18 NOTE — Assessment & Plan Note (Signed)
Chronic Controlled, Stable Continue Lexapro 20 mg daily, clonazepam 1 mg twice daily as needed

## 2021-07-18 NOTE — Assessment & Plan Note (Signed)
Chronic We are trying to slowly taper him off the oxycodone In the past 4 days he has self decreased the oxycodone from 4 times a day to 3 times a day and he will continue this Hopefully over the next several months we will be able to get down to twice a day and eventually be off medication He is taking Tylenol as needed, which helps he is trying to distract himself from the pain and he knows that he needs to mentally adjust and not needing the medications Continue oxycodone 10 mg 3 times a day [place] controlled substance database checked He had a recent refill so no refill today Follow-up in 3 months If we can further decrease the oxycodone twice a day some days he will

## 2021-07-21 DIAGNOSIS — M6281 Muscle weakness (generalized): Secondary | ICD-10-CM | POA: Diagnosis not present

## 2021-07-24 ENCOUNTER — Other Ambulatory Visit: Payer: Self-pay | Admitting: Physician Assistant

## 2021-07-25 DIAGNOSIS — M6281 Muscle weakness (generalized): Secondary | ICD-10-CM | POA: Diagnosis not present

## 2021-07-28 DIAGNOSIS — K1379 Other lesions of oral mucosa: Secondary | ICD-10-CM | POA: Diagnosis not present

## 2021-07-28 DIAGNOSIS — R21 Rash and other nonspecific skin eruption: Secondary | ICD-10-CM | POA: Diagnosis not present

## 2021-07-28 DIAGNOSIS — M549 Dorsalgia, unspecified: Secondary | ICD-10-CM | POA: Diagnosis not present

## 2021-07-28 DIAGNOSIS — E230 Hypopituitarism: Secondary | ICD-10-CM | POA: Diagnosis not present

## 2021-07-28 DIAGNOSIS — H538 Other visual disturbances: Secondary | ICD-10-CM | POA: Diagnosis not present

## 2021-07-28 DIAGNOSIS — R918 Other nonspecific abnormal finding of lung field: Secondary | ICD-10-CM | POA: Diagnosis not present

## 2021-07-28 DIAGNOSIS — C7801 Secondary malignant neoplasm of right lung: Secondary | ICD-10-CM | POA: Diagnosis not present

## 2021-07-28 DIAGNOSIS — C7951 Secondary malignant neoplasm of bone: Secondary | ICD-10-CM | POA: Diagnosis not present

## 2021-07-28 DIAGNOSIS — E039 Hypothyroidism, unspecified: Secondary | ICD-10-CM | POA: Diagnosis not present

## 2021-07-28 DIAGNOSIS — C4359 Malignant melanoma of other part of trunk: Secondary | ICD-10-CM | POA: Diagnosis not present

## 2021-07-28 DIAGNOSIS — Z79899 Other long term (current) drug therapy: Secondary | ICD-10-CM | POA: Diagnosis not present

## 2021-07-28 DIAGNOSIS — C779 Secondary and unspecified malignant neoplasm of lymph node, unspecified: Secondary | ICD-10-CM | POA: Diagnosis not present

## 2021-07-28 DIAGNOSIS — G8929 Other chronic pain: Secondary | ICD-10-CM | POA: Diagnosis not present

## 2021-07-28 DIAGNOSIS — C439 Malignant melanoma of skin, unspecified: Secondary | ICD-10-CM | POA: Diagnosis not present

## 2021-07-28 DIAGNOSIS — Z87891 Personal history of nicotine dependence: Secondary | ICD-10-CM | POA: Diagnosis not present

## 2021-07-28 DIAGNOSIS — C7931 Secondary malignant neoplasm of brain: Secondary | ICD-10-CM | POA: Diagnosis not present

## 2021-07-28 DIAGNOSIS — H209 Unspecified iridocyclitis: Secondary | ICD-10-CM | POA: Diagnosis not present

## 2021-07-28 DIAGNOSIS — E2740 Unspecified adrenocortical insufficiency: Secondary | ICD-10-CM | POA: Diagnosis not present

## 2021-07-28 DIAGNOSIS — Z7952 Long term (current) use of systemic steroids: Secondary | ICD-10-CM | POA: Diagnosis not present

## 2021-07-28 DIAGNOSIS — C7802 Secondary malignant neoplasm of left lung: Secondary | ICD-10-CM | POA: Diagnosis not present

## 2021-07-29 DIAGNOSIS — M6281 Muscle weakness (generalized): Secondary | ICD-10-CM | POA: Diagnosis not present

## 2021-07-30 ENCOUNTER — Other Ambulatory Visit: Payer: Self-pay | Admitting: Internal Medicine

## 2021-08-01 DIAGNOSIS — M6281 Muscle weakness (generalized): Secondary | ICD-10-CM | POA: Diagnosis not present

## 2021-08-06 DIAGNOSIS — M6281 Muscle weakness (generalized): Secondary | ICD-10-CM | POA: Diagnosis not present

## 2021-08-12 ENCOUNTER — Encounter: Payer: Self-pay | Admitting: Internal Medicine

## 2021-08-13 ENCOUNTER — Other Ambulatory Visit: Payer: Self-pay | Admitting: Internal Medicine

## 2021-08-13 DIAGNOSIS — G894 Chronic pain syndrome: Secondary | ICD-10-CM

## 2021-08-13 DIAGNOSIS — S32512D Fracture of superior rim of left pubis, subsequent encounter for fracture with routine healing: Secondary | ICD-10-CM

## 2021-08-13 DIAGNOSIS — G893 Neoplasm related pain (acute) (chronic): Secondary | ICD-10-CM

## 2021-08-13 MED ORDER — OXYCODONE HCL 10 MG PO TABS: 10.0000 mg | ORAL_TABLET | Freq: Four times a day (QID) | ORAL | 0 refills | Status: DC | PRN

## 2021-08-15 ENCOUNTER — Other Ambulatory Visit: Payer: Self-pay | Admitting: *Deleted

## 2021-08-15 NOTE — Patient Outreach (Signed)
[facility]) Care Management  08/15/2021  Dontel Harshberger 10-08-1945 [id number]  Telephone outreach to close case. Pt no longer needs frequent follow ups. Call was unsuccessful but left a message to return my call. Will send pt closure letter and a certificate of participation.  [name] Myrtie Neither, MSN, [facility] Gerontological Nurse Practitioner [facility] Care Management [phone]

## 2021-08-23 ENCOUNTER — Other Ambulatory Visit: Payer: Self-pay | Admitting: Internal Medicine

## 2021-08-25 ENCOUNTER — Ambulatory Visit: Payer: Medicare Other | Admitting: Internal Medicine

## 2021-09-09 ENCOUNTER — Encounter: Payer: Self-pay | Admitting: Internal Medicine

## 2021-09-09 DIAGNOSIS — S32512D Fracture of superior rim of left pubis, subsequent encounter for fracture with routine healing: Secondary | ICD-10-CM

## 2021-09-09 DIAGNOSIS — G893 Neoplasm related pain (acute) (chronic): Secondary | ICD-10-CM

## 2021-09-09 DIAGNOSIS — G894 Chronic pain syndrome: Secondary | ICD-10-CM

## 2021-09-09 MED ORDER — OXYCODONE HCL 10 MG PO TABS: 10.0000 mg | ORAL_TABLET | Freq: Four times a day (QID) | ORAL | 0 refills | Status: DC | PRN

## 2021-09-10 ENCOUNTER — Encounter: Payer: Self-pay | Admitting: Internal Medicine

## 2021-09-10 ENCOUNTER — Ambulatory Visit: Payer: Medicare Other | Admitting: Internal Medicine

## 2021-09-11 MED ORDER — OXYCODONE HCL 5 MG PO TABS: 10.0000 mg | ORAL_TABLET | Freq: Four times a day (QID) | ORAL | 0 refills | Status: DC | PRN

## 2021-10-09 ENCOUNTER — Encounter: Payer: Self-pay | Admitting: Internal Medicine

## 2021-10-09 ENCOUNTER — Ambulatory Visit: Payer: Medicare Other | Admitting: *Deleted

## 2021-10-10 MED ORDER — OXYCODONE HCL 5 MG PO TABS: 10.0000 mg | ORAL_TABLET | Freq: Four times a day (QID) | ORAL | 0 refills | Status: DC | PRN

## 2021-10-10 NOTE — Addendum Note (Signed)
Addended by: [name] on: 10/10/2021 12:15 PM   Modules accepted: Orders

## 2021-10-12 ENCOUNTER — Encounter: Payer: Self-pay | Admitting: Internal Medicine

## 2021-10-12 NOTE — Patient Instructions (Addendum)
        Medications changes include :   none   Your prescription(s) have been sent to your pharmacy.    A referral was ordered for XX.     Someone from that office will call you to schedule an appointment.    Return in about 3 months (around 01/13/2022) for follow up.

## 2021-10-12 NOTE — Progress Notes (Unsigned)
Subjective:    Patient ID: Maxwell Fox, male    DOB: [birth year], 76 y.o.   MRN: [id number]     HPI Maxwell Fox is here for follow up of his chronic medical problems, including chronic pain syndrome, anxiety, depression, metastatic melanoma in remission  We just decreased his oxycodone dose from 40 mg / day to 35 mg / day.  He finds the 5 mg tabs easier to break up his doses.    He is exercising regularly.   He does his PT exercises - balance exercises leg exercises.  He does some walking  His anxiety is worse.  He wondered about possibly increasing the clonazepam.  He is no longer taking the Lexapro and does not feel that that helped.  Medications and allergies reviewed with patient and updated if appropriate.  Current Outpatient Medications on File Prior to Visit  Medication Sig Dispense Refill   acetaminophen (TYLENOL) 325 MG tablet Take 2 tablets (650 mg total) by mouth every 6 (six) hours as needed for moderate pain.     calcium carbonate (OS-CAL - DOSED IN MG OF ELEMENTAL CALCIUM) 1250 (500 Ca) MG tablet Take 1 tablet by mouth daily with supper.     Cholecalciferol (VITAMIN D) 2000 UNITS tablet Take 1 tablet (2,000 Units total) by mouth daily. 30 tablet 11   clonazePAM (KLONOPIN) 1 MG tablet TAKE 1 TABLET(1 MG) BY MOUTH TWICE DAILY AS NEEDED FOR ANXIETY 60 tablet 2   levothyroxine (SYNTHROID) 88 MCG tablet TAKE 1 TABLET(88 MCG) BY MOUTH DAILY BEFORE AND BREAKFAST 90 tablet 3   Multiple Vitamin (MULTIVITAMIN WITH MINERALS) TABS tablet Take 1 tablet by mouth daily.     ondansetron (ZOFRAN) 4 MG tablet Take 1 tablet (4 mg total) by mouth every 8 (eight) hours as needed for nausea or vomiting. 20 tablet 0   oxyCODONE (ROXICODONE) 5 MG immediate release tablet Take 2 tablets (10 mg total) by mouth every 6 (six) hours as needed for severe pain. 210 tablet 0   predniSONE (DELTASONE) 5 MG tablet TAKE 1 TABLET BY MOUTH EVERY DAY WITH BREAKFAST 90 tablet 3   tamsulosin (FLOMAX) 0.4 MG  CAPS capsule TAKE 1 CAPSULE(0.4 MG) BY MOUTH DAILY AFTER AND SUPPER 90 capsule 1   triamcinolone cream (KENALOG) 0.1 % as needed.     No current facility-administered medications on file prior to visit.     Review of Systems  Constitutional:  Negative for fever.  Respiratory:  Negative for cough, shortness of breath and wheezing.   Cardiovascular:  Negative for chest pain, palpitations and leg swelling.  Neurological:  Negative for light-headedness and headaches.  Psychiatric/Behavioral:  Negative for dysphoric mood and sleep disturbance. The patient is nervous/anxious.        Objective:   Vitals:   10/14/21 0956  BP: 120/60  Pulse: 69  Temp: 98.2 F (36.8 C)  SpO2: 99%   BP Readings from Last 3 Encounters:  10/14/21 120/60  07/18/21 128/72  05/19/21 130/74   Wt Readings from Last 3 Encounters:  10/14/21 174 lb (78.9 kg)  07/18/21 176 lb 9.6 oz (80.1 kg)  05/19/21 170 lb (77.1 kg)   Body mass index is 24.27 kg/m.    Physical Exam Constitutional:      General: He is not in acute distress.    Appearance: Normal appearance. He is not ill-appearing.  HENT:     Head: Normocephalic and atraumatic.  Eyes:     Conjunctiva/sclera: Conjunctivae normal.  Cardiovascular:  Rate and Rhythm: Normal rate and regular rhythm.     Heart sounds: Normal heart sounds. No murmur heard. Pulmonary:     Effort: Pulmonary effort is normal. No respiratory distress.     Breath sounds: Normal breath sounds. No wheezing or rales.  Musculoskeletal:     Right lower leg: Edema (1+ pitting edema) present.     Left lower leg: Edema (1+ pitting edema) present.  Skin:    General: Skin is warm and dry.     Findings: No rash.  Neurological:     Mental Status: He is alert. Mental status is at baseline.  Psychiatric:        Mood and Affect: Mood normal.        Lab Results  Component Value Date   WBC 7.1 05/14/2020   HGB 12.1 (L) 05/14/2020   HCT 37.4 (L) 05/14/2020   PLT 185.0  05/14/2020   GLUCOSE 74 04/01/2020   CHOL 195 01/07/2017   TRIG 152.0 (H) 01/07/2017   HDL 73.20 01/07/2017   LDLCALC 91 01/07/2017   ALT 14 03/27/2020   AST 24 03/27/2020   NA 141 04/01/2020   K 3.6 04/01/2020   CL 108 04/01/2020   CREATININE 0.95 04/01/2020   BUN 13 04/01/2020   CO2 27 04/01/2020   TSH 0.234 (L) 03/04/2020   INR 1.2 02/26/2020   HGBA1C 4.7 (L) 02/14/2020     Assessment & Plan:    See Problem List for Assessment and Plan of chronic medical problems.

## 2021-10-14 ENCOUNTER — Ambulatory Visit (INDEPENDENT_AMBULATORY_CARE_PROVIDER_SITE_OTHER): Payer: Medicare Other | Admitting: Internal Medicine

## 2021-10-14 VITALS — BP 120/60 | HR 69 | Temp 98.2°F | Ht 71.0 in | Wt 174.0 lb

## 2021-10-14 DIAGNOSIS — M810 Age-related osteoporosis without current pathological fracture: Secondary | ICD-10-CM | POA: Diagnosis not present

## 2021-10-14 DIAGNOSIS — F32A Depression, unspecified: Secondary | ICD-10-CM | POA: Diagnosis not present

## 2021-10-14 DIAGNOSIS — F419 Anxiety disorder, unspecified: Secondary | ICD-10-CM

## 2021-10-14 DIAGNOSIS — G894 Chronic pain syndrome: Secondary | ICD-10-CM

## 2021-10-14 DIAGNOSIS — N400 Enlarged prostate without lower urinary tract symptoms: Secondary | ICD-10-CM

## 2021-10-14 MED ORDER — FLUOXETINE HCL 20 MG PO CAPS: 20.0000 mg | ORAL_CAPSULE | Freq: Every day | ORAL | 3 refills | Status: DC

## 2021-10-14 MED ORDER — CLONAZEPAM 1 MG PO TABS: ORAL_TABLET | ORAL | 2 refills | Status: DC

## 2021-10-14 MED ORDER — ONDANSETRON HCL 4 MG PO TABS: 4.0000 mg | ORAL_TABLET | Freq: Three times a day (TID) | ORAL | 1 refills | Status: DC | PRN

## 2021-10-14 NOTE — Assessment & Plan Note (Signed)
Chronic Has been on pain medication for a few years-initially secondary to pelvic fractures and then related to metastatic cancer We are attempting to slowly decrease and wean him off the oxycodone For the past few months has been taking oxycodone 10 mg every 6 hours equal 40 mg/day We just decreased this to 35 mg/day-we will have 5 mg pills so we can split up the dosing as needed Stressed that we will not fill medication early and cannot use more than allowed We will decrease to 30 mg/day after 2 months

## 2021-10-14 NOTE — Assessment & Plan Note (Signed)
Noted on his lower legs in the last summer Sees Dr. Cruzita Lederer soon and he can discuss with her

## 2021-10-14 NOTE — Assessment & Plan Note (Signed)
Chronic Controlled Continue Flomax 0.4 mg daily 

## 2021-10-14 NOTE — Assessment & Plan Note (Addendum)
Chronic anxiety not controlled, denies depression Continue clonazepam 1 mg twice daily-discussed that we cannot increase this Start fluoxetine 20 mg daily-discussed it may take 2-4 weeks for him to notice improvement on this medication

## 2021-10-17 ENCOUNTER — Encounter: Payer: Self-pay | Admitting: Internal Medicine

## 2021-10-28 DIAGNOSIS — G8929 Other chronic pain: Secondary | ICD-10-CM | POA: Diagnosis not present

## 2021-10-28 DIAGNOSIS — E039 Hypothyroidism, unspecified: Secondary | ICD-10-CM | POA: Diagnosis not present

## 2021-10-28 DIAGNOSIS — C779 Secondary and unspecified malignant neoplasm of lymph node, unspecified: Secondary | ICD-10-CM | POA: Diagnosis not present

## 2021-10-28 DIAGNOSIS — E230 Hypopituitarism: Secondary | ICD-10-CM | POA: Diagnosis not present

## 2021-10-28 DIAGNOSIS — Z87891 Personal history of nicotine dependence: Secondary | ICD-10-CM | POA: Diagnosis not present

## 2021-10-28 DIAGNOSIS — Z9221 Personal history of antineoplastic chemotherapy: Secondary | ICD-10-CM | POA: Diagnosis not present

## 2021-10-28 DIAGNOSIS — E2740 Unspecified adrenocortical insufficiency: Secondary | ICD-10-CM | POA: Diagnosis not present

## 2021-10-28 DIAGNOSIS — C439 Malignant melanoma of skin, unspecified: Secondary | ICD-10-CM | POA: Diagnosis not present

## 2021-10-28 DIAGNOSIS — C7931 Secondary malignant neoplasm of brain: Secondary | ICD-10-CM | POA: Diagnosis not present

## 2021-10-28 DIAGNOSIS — M549 Dorsalgia, unspecified: Secondary | ICD-10-CM | POA: Diagnosis not present

## 2021-10-28 DIAGNOSIS — Z7952 Long term (current) use of systemic steroids: Secondary | ICD-10-CM | POA: Diagnosis not present

## 2021-10-28 DIAGNOSIS — H209 Unspecified iridocyclitis: Secondary | ICD-10-CM | POA: Diagnosis not present

## 2021-10-28 DIAGNOSIS — Z9884 Bariatric surgery status: Secondary | ICD-10-CM | POA: Diagnosis not present

## 2021-10-28 DIAGNOSIS — M2550 Pain in unspecified joint: Secondary | ICD-10-CM | POA: Diagnosis not present

## 2021-11-03 ENCOUNTER — Other Ambulatory Visit: Payer: Self-pay | Admitting: Internal Medicine

## 2021-11-04 ENCOUNTER — Ambulatory Visit (INDEPENDENT_AMBULATORY_CARE_PROVIDER_SITE_OTHER): Payer: Medicare Other | Admitting: Internal Medicine

## 2021-11-04 ENCOUNTER — Encounter: Payer: Self-pay | Admitting: Internal Medicine

## 2021-11-04 VITALS — BP 110/60 | HR 65 | Ht 71.0 in | Wt 173.4 lb

## 2021-11-04 DIAGNOSIS — E038 Other specified hypothyroidism: Secondary | ICD-10-CM | POA: Diagnosis not present

## 2021-11-04 DIAGNOSIS — M810 Age-related osteoporosis without current pathological fracture: Secondary | ICD-10-CM | POA: Diagnosis not present

## 2021-11-04 DIAGNOSIS — E273 Drug-induced adrenocortical insufficiency: Secondary | ICD-10-CM

## 2021-11-04 LAB — VITAMIN D 25 HYDROXY (VIT D DEFICIENCY, FRACTURES): VITD: 45.23 ng/mL (ref 30.00–100.00)

## 2021-11-04 NOTE — Progress Notes (Signed)
Patient ID: Johnn Krasowski, male   DOB: 07-24-1945, 76 y.o.   MRN: [id number]   HPI  Madyx Delfin is a 76 y.o.-year-old male, initially referred by his PCP, Dr. Quay Burow, returning for follow-up for central adrenal insufficiency,  central hypothyroidism, and clinical osteoporosis.  Last visit 6 months ago. Prev. Saw Dr. Dara Lords at [facility].  Interim history: Before last visit, his metastatic melanoma recurred - new lesions in proximal right tibia and right axillary lymph node (per review of the PET scan results  from 11/27/2020) >> treatment intensified. He had a lot of weakness and disequilibrium after his immune therapy was increased.  At last visit he was doing PT at home and preparing to do outpatient PT. He had a PET scan in 10/28/2021:  he is cancer free. No increased fatigue, GI symptoms, chest pain, shortness of breath.  Resolved blurry vision - after using Prednisone drops.. No falls or fractures since last visit.   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, brain (pembrolizumab, ipilimumab)  He is on levothyroxine 88 mcg daily: - in am - fasting - at least 1h from b'fast - no Fe, PPIs - + MVI and calcium at 4 PM  - not on Biotin  Reviewed patient's TFTs: 10/28/2021: TSH 2.09 01/28/2021: fT4 1.49 (0.82-1.77) Lab Results  Component Value Date   TSH 0.234 (L) 03/04/2020   TSH 0.53 08/18/2019   TSH 0.53 02/17/2019   TSH 0.550 08/16/2018   TSH 2.39 11/01/2014   FREET4 1.01 08/21/2020   FREET4 0.96 08/18/2019   FREET4 1.36 02/17/2019   T3FREE 3.8 08/21/2020   T3FREE 2.3 02/17/2019   Reviewed records from [place] (Care Everywhere):   Antithyroid antibodies: No results found for: "THGAB" No components found for: "TPOAB"  Pt denies: - feeling nodules in neck - hoarseness - dysphagia - choking  She has no FH of thyroid disorders. No FH of thyroid cancer. No h/o radiation tx to head or  neck. No herbal supplements. No Biotin use.   Central adrenal insufficiency:  His adrenal insufficiency was diagnosed in 2018, secondary to metastatic melanoma treatment.    21-hydroxylase antibodies were not elevated:   He is on prednisone 5 mg daily in the morning. He tried 3 and 6 mg >> he feels best on 5 mg.  Osteoporosis: - In 01/2020, he had a closed fracture of the superior is ramus of left pubis after falling in his bathroom.  He is now pain-free. In 2022, he also had GIB 2/2 staples still left at the site of his previous gastric bypass surgery. He had a colonoscopy and 4 EGDs. He was weak and fell again several times due to persistent anemia.  He actually had a pelvic refracture while in house when they were turning him.  We checked a bone density scan in 08/2020 and this showed osteoporosis: 08/22/2020 ([place]) Lumbar spine L1-L4 (L2) Femoral neck (FN) 33% distal radius  T-score -0.8 RFN: -2.6 LFN: -2.2 n/a   I suggested treatment with Reclast or Prolia, but at last visit I recommended to first discuss with his oncologist to see if he needs Zometa or Xgeva instead.  Reviewed Vitamin D levels: Lab Results  Component Value Date   VD25OH 66.31 08/21/2020   VD25OH 44.63 01/07/2017   VD25OH 47.11 11/01/2014   He is on 1000 units vitamin D supplement daily - missing some doses.  Recent BMP: 10/28/2021: 20/1.1, GFR 70, calcium 8.8 (  8.7-10.2)  Of note, he has a history of R en Y gastric bypass in 2000 (initial weight : 350 lbs!),  HTN. He also had a TIA in 2018, but this appears to have resolved.   He has nocturia 1-2x a night -he takes Flomax.  He moved from [place] to [place] to be near her daughter and her 3 children.  Daughter is going through a difficult divorce.  ROS:+ see HPI no  fatigue + vitiligo hands and arms after immune treatment for his melanoma, also + easy bruising  I reviewed pt's medications, allergies, PMH, social hx, family hx, and changes were  documented in the history of present illness. Otherwise, unchanged from my initial visit note.  Past Medical History:  Diagnosis Date   B12 nutritional deficiency    malabsorbtion   Blood transfusion without reported diagnosis 2022   BPH (benign prostatic hypertrophy)    Brain cancer ([place]) 2017   removed "golf ball sized mass"   Depression    on meds   Diverticulosis    GERD (gastroesophageal reflux disease)    on meds   GI bleeding 02/2020   Hypertension    Iron deficiency anemia    malabsorption related (s/p gastric bypass)   Melanoma ([facility])    Obesity    s/p gastric bypass 2000, start 355#   Pancreatic cyst    Pulmonary nodules    Thyroid disease    on meds   Past Surgical History:  Procedure Laterality Date   CHOLECYSTECTOMY  2000   COLONOSCOPY WITH PROPOFOL N/A 03/05/2020   Procedure: COLONOSCOPY WITH PROPOFOL;  Surgeon: Irving Copas., MD;  Location[facility] ENDOSCOPY;  Service: Gastroenterology;  Laterality: N/A;   CRANIOTOMY FOR TUMOR  12/11/2015   metastatic melanoma   ENTEROSCOPY N/A 03/30/2020   Procedure: ENTEROSCOPY;  Surgeon: Irene Shipper, MD;  Location: [facility] ENDOSCOPY;  Service: Endoscopy;  Laterality: N/A;   ESOPHAGOGASTRODUODENOSCOPY N/A 03/30/2020   Procedure: ESOPHAGOGASTRODUODENOSCOPY (EGD);  Surgeon: Irene Shipper, MD;  Location: [facility] ENDOSCOPY;  Service: Endoscopy;  Laterality: N/A;   ESOPHAGOGASTRODUODENOSCOPY (EGD) WITH PROPOFOL N/A 02/14/2020   Procedure: ESOPHAGOGASTRODUODENOSCOPY (EGD) WITH PROPOFOL;  Surgeon: Lavena Bullion, DO;  Location: [facility] ENDOSCOPY;  Service: Endoscopy;  Laterality: N/A;   ESOPHAGOGASTRODUODENOSCOPY (EGD) WITH PROPOFOL N/A 02/26/2020   Procedure: ESOPHAGOGASTRODUODENOSCOPY (EGD) WITH PROPOFOL;  Surgeon: Irene Shipper, MD;  Location: [facility] ENDOSCOPY;  Service: Endoscopy;  Laterality: N/A;   ESOPHAGOGASTRODUODENOSCOPY (EGD) WITH PROPOFOL N/A 03/03/2020   Procedure: ESOPHAGOGASTRODUODENOSCOPY (EGD) WITH PROPOFOL;  Surgeon: Carol Ada, MD;  Location: [facility];  Service: Endoscopy;  Laterality: N/A;   ESOPHAGOGASTRODUODENOSCOPY (EGD) WITH PROPOFOL N/A 03/28/2020   Procedure: ESOPHAGOGASTRODUODENOSCOPY (EGD) WITH PROPOFOL;  Surgeon: Irene Shipper, MD;  Location: [facility] ENDOSCOPY;  Service: Endoscopy;  Laterality: N/A;   FOREIGN BODY REMOVAL  03/30/2020   Procedure: FOREIGN BODY REMOVAL;  Surgeon: Irene Shipper, MD;  Location: [facility] ENDOSCOPY;  Service: Endoscopy;;   GIVENS CAPSULE STUDY N/A 03/28/2020   Procedure: GIVENS CAPSULE STUDY;  Surgeon: Irene Shipper, MD;  Location: [facility] ENDOSCOPY;  Service: Endoscopy;  Laterality: N/A;   HEMOSTASIS CONTROL  02/26/2020   Procedure: HEMOSTASIS CONTROL;  Surgeon: Irene Shipper, MD;  Location: [facility] ENDOSCOPY;  Service: Endoscopy;;   HEMOSTASIS CONTROL  03/30/2020   Procedure: HEMOSTASIS CONTROL;  Surgeon: Irene Shipper, MD;  Location: [facility] ENDOSCOPY;  Service: Endoscopy;;   HOT HEMOSTASIS N/A 02/26/2020   Procedure: HOT HEMOSTASIS (ARGON PLASMA COAGULATION/BICAP);  Surgeon: Irene Shipper, MD;  Location: [facility] ENDOSCOPY;  Service: Endoscopy;  Laterality: N/A;   MOHS SURGERY  2012   melanoma, mid back, [place]   TOTAL SHOULDER REPLACEMENT Right 2011   [place]   WISDOM TOOTH EXTRACTION     Social History   Socioeconomic History   Marital status: Married    Spouse name: Not on file   Number of children: 2   Years of education: Not on file   Highest education level: Not on file  Occupational History   retired  Tobacco Use   Smoking status: Former Smoker    Quit date: 02/03/1963    Years since quitting: 56.0   Smokeless tobacco: Never Used  Substance and Sexual Activity   Alcohol use: Not on file    Comment: wine 2x/week   Drug use: Not on file   Sexual activity: Not on file  Other Topics Concern   Not on file  Social History Narrative   Lives with wife   Retired - Conservator, 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 [employer]   Social Determinants of  Radio broadcast assistant Strain:    Difficulty of Paying Living Expenses: Not on [employer] Insecurity:    Worried About Charity fundraiser in the Last Year: Not on file   [place] in the Last Year: Not on file  Transportation Needs:    Film/video editor (Medical): Not on file   Lack of Transportation (Non-Medical): Not on file  Physical Activity:    Days of Exercise per Week: Not on file   Minutes of Exercise per Session: Not on file  Stress:    Feeling of Stress : Not on file  Social Connections:    Frequency of Communication with Friends and Family: Not on file   Frequency of Social Gatherings with Friends and Family: Not on file   Attends Religious Services: Not on file   Active Member of Clubs or Organizations: Not on file   Attends Archivist Meetings: Not on file   Marital Status: Not on file  Intimate Partner Violence:    Fear of Current or Ex-Partner: Not on file   Emotionally Abused: Not on file   Physically Abused: Not on file   Sexually Abused: Not on file   Current Outpatient Medications on File Prior to Visit  Medication Sig Dispense Refill   acetaminophen (TYLENOL) 325 MG tablet Take 2 tablets (650 mg total) by mouth every 6 (six) hours as needed for moderate pain.     calcium carbonate (OS-CAL - DOSED IN MG OF ELEMENTAL CALCIUM) 1250 (500 Ca) MG tablet Take 1 tablet by mouth daily with supper.     Cholecalciferol (VITAMIN D) 2000 UNITS tablet Take 1 tablet (2,000 Units total) by mouth daily. 30 tablet 11   clonazePAM (KLONOPIN) 1 MG tablet TAKE 1 TABLET(1 MG) BY MOUTH TWICE DAILY AS NEEDED FOR ANXIETY 60 tablet 2   FLUoxetine (PROZAC) 20 MG capsule Take 1 capsule (20 mg total) by mouth daily. 90 capsule 3   levothyroxine (SYNTHROID) 88 MCG tablet TAKE 1 TABLET(88 MCG) BY MOUTH DAILY BEFORE AND BREAKFAST 90 tablet 3   Multiple Vitamin (MULTIVITAMIN WITH MINERALS) TABS tablet Take 1 tablet by mouth daily.     ondansetron (ZOFRAN) 4 MG  tablet Take 1 tablet (4 mg total) by mouth every 8 (eight) hours as needed for nausea or vomiting. 20 tablet 1   oxyCODONE (ROXICODONE) 5  MG immediate release tablet Take 2 tablets (10 mg total) by mouth every 6 (six) hours as needed for severe pain. 210 tablet 0   predniSONE (DELTASONE) 5 MG tablet TAKE 1 TABLET BY MOUTH EVERY DAY WITH BREAKFAST 90 tablet 3   tamsulosin (FLOMAX) 0.4 MG CAPS capsule TAKE 1 CAPSULE BY MOUTH EVERY DAY AFTER SUPPER 90 capsule 1   triamcinolone cream (KENALOG) 0.1 % as needed.     No current facility-administered medications on file prior to visit.   No Known Allergies  Family History  Problem Relation Age of Onset   Hypertension Father    Macular degeneration Mother    Melanoma Mother    Melanoma Sister    Stomach cancer Sister 64       duodenal   Breast cancer Sister    Uterine cancer Sister    Melanoma Brother    Colon polyps Neg Hx    Colon cancer Neg Hx    Esophageal cancer Neg Hx    Rectal cancer Neg Hx    PE: BP 110/60   Pulse 65   Ht '5\' 11"'$  (1.803 m)   Wt 173 lb 6.4 oz (78.7 kg)   SpO2 95%   BMI 24.18 kg/m  Wt Readings from Last 3 Encounters:  11/04/21 173 lb 6.4 oz (78.7 kg)  10/14/21 174 lb (78.9 kg)  07/18/21 176 lb 9.6 oz (80.1 kg)   Constitutional: normal weight in NAD, frail Eyes: EOMI, no exophthalmos ENT: no thyromegaly, no cervical lymphadenopathy Cardiovascular: RRR, No MRG, wears compression hoses Respiratory: CTA B Musculoskeletal: no deformities Skin: moist, warm, no rashes, bruise L lower lip Neurological: no tremor with outstretched hands  ASSESSMENT: 1. Central Hypothyroidism  2. Central adrenal insufficiency  3.  Osteoporosis - h/o Ramus pubis fracture  PLAN:  1. Patient with central hypothyroidism, levothyroxine treatment -Possibly related to ipilimumab, but also possibly due to pembrolizumab (although this most likely causes thyroiditis, more frequently than hypophysitis) -He had a normal free T4 in  01/2021 -Interestingly, recent TSH was normal on 10/28/2020, at 2.09, however, it is unclear whether this could be followed to assess efficacy of treatment.  For now, I would prefer to check his free T4, she just has more reliable in follow-up of central hypothyroidism -He continues on levothyroxine 88 mcg daily - pt feels good on this dose. - we discussed about taking the thyroid hormone every day, with water, >30 minutes before breakfast, separated by >4 hours from acid reflux medications, calcium, iron, multivitamins. Pt. is taking it correctly. - will check his fT4 at next OV -we discussed to skip his prednisone and levothyroxine the morning of the labs. - I will see him back in 6 months  2.  Central adrenal insufficiency -Continues on 5 mg of prednisone daily >> he feels that this is the right dose for him.  He tried 3 mg daily but this did not work well for him. -No cortisol or ACTH levels needed for follow-up -No adrenal insufficiency symptoms: No weakness, dizziness, weight loss, abdominal pain, headaches, anorexia -We again reviewed sick day rules.  Discussed about increasing the dose of prednisone x2 in case of fever and obtain the medication parenterally if he could not keep tablets down.   -He does have a steroid solution at home to inject in case of an emergency  3.  Osteoporosis -with ramus pubis fracture, after he fell from level ground -His bone density showed osteoporosis in 08/2020 -In the setting of his metastatic disease,  I advised him to discuss with oncology to see if Delton See would be a good option for him.  He did discuss with him and he was recommended to hold off any treatment until his imaging in June. -At today's visit, we again discussed about possibly using Prolia versus Reclast (the oncology team did not suggest Xgeva or Zometa.  He agrees to try Prolia.  If this is not covered by his insurance, we can go with Reclast.  Discussed about dose and intervals and modality of  administration for both.  Also discussed about expectant benefits. -His calcium level was slightly low, at 8.5 (8.7-10.2) on 04/16/2021, however, an albumin level was also low at that time so the corrected calcium was actually normal, at 9.2.  Alkaline phosphatase was slightly high, at 126 (24-110).  He had a recent calcium level which was normal few days ago.  GFR was 70. -His most recent vitamin D level was normal in 08/2020.  We will recheck this today. -He continues on prednisone 5 mg daily -Discussed about avoiding falls -At last visit he was undergoing physical therapy including weightbearing exercises and feeling that he was gaining strength  Component     Latest Ref Rng 11/04/2021  VITD     30.00 - 100.00 ng/mL 45.23   Normal vitamin D. We will start the application for Prolia.  [name], MD PhD [facility] Endocrinology

## 2021-11-04 NOTE — Patient Instructions (Addendum)
Please continue Levothyroxine 88 mcg daily.  Take the thyroid hormone every day, with water, at least 30 minutes before breakfast, separated by at least 4 hours from: - acid reflux medications - calcium - iron - multivitamins  Continue prednisone 5 mg daily.  Please remember: - You absolutely need to take this medication every day and not skip doses. - Please double the dose if you have a fever, for the duration of the fever. - If you cannot take anything by mouth (vomiting) or you have severe diarrhea so that you eliminate the Prednisone pills in your stool, please make sure that you get the Prednisone in the vein instead - go to the nearest emergency department/urgent care or you may go to your PCPs office   Please stop at the lab.  Please come back for a follow-up appointment in 6 months.

## 2021-11-05 ENCOUNTER — Encounter: Payer: Self-pay | Admitting: Internal Medicine

## 2021-11-05 MED ORDER — OXYCODONE HCL 5 MG PO TABS: 10.0000 mg | ORAL_TABLET | Freq: Four times a day (QID) | ORAL | 0 refills | Status: DC | PRN

## 2021-11-08 ENCOUNTER — Telehealth: Payer: Self-pay

## 2021-11-08 NOTE — Telephone Encounter (Signed)
Maxwell Kingdom, MD  Jasper Loser, CMA Theadora Rama,  Can you please submit an application for this patient to the Prolia portal?  Thank you!  CG

## 2021-11-08 NOTE — Telephone Encounter (Signed)
Prolia VOB initiated via [email or web address] ? ?New start ? ?

## 2021-11-18 ENCOUNTER — Ambulatory Visit (INDEPENDENT_AMBULATORY_CARE_PROVIDER_SITE_OTHER): Payer: Medicare Other

## 2021-11-18 DIAGNOSIS — Z23 Encounter for immunization: Secondary | ICD-10-CM

## 2021-11-20 ENCOUNTER — Encounter: Payer: Self-pay | Admitting: Internal Medicine

## 2021-11-24 NOTE — Telephone Encounter (Signed)
Pt ready for scheduling on or after 11/24/21  Out-of-pocket cost due at time of visit: $0  Primary: Medicare Prolia co-insurance: 20% (approximately $276) Admin fee co-insurance: 20% (approximately $25)  Secondary: AARP Medicare Supp Prolia co-insurance: Covers Medicare Part B co-insurance Admin fee co-insurance: Covers Medicare Part B co-insurance  Deductible:  Covered by secondary  Prior Auth: NOT required PA# Valid:   ** This summary of benefits is an estimation of the patient's out-of-pocket cost. Exact cost may vary based on individual plan coverage.

## 2021-11-25 DIAGNOSIS — H26493 Other secondary cataract, bilateral: Secondary | ICD-10-CM | POA: Diagnosis not present

## 2021-11-25 DIAGNOSIS — Z961 Presence of intraocular lens: Secondary | ICD-10-CM | POA: Diagnosis not present

## 2021-11-25 DIAGNOSIS — H5213 Myopia, bilateral: Secondary | ICD-10-CM | POA: Diagnosis not present

## 2021-11-25 NOTE — Telephone Encounter (Signed)
My chart message sent to pt to schedule appt.

## 2021-11-26 ENCOUNTER — Encounter: Payer: Self-pay | Admitting: Internal Medicine

## 2021-11-27 NOTE — Telephone Encounter (Signed)
Patient is scheduled for 12/05/2021 at 10:00 for Prolia Injection.

## 2021-12-01 ENCOUNTER — Other Ambulatory Visit: Payer: Self-pay

## 2021-12-01 ENCOUNTER — Emergency Department (HOSPITAL_COMMUNITY)
Admission: EM | Admit: 2021-12-01 | Discharge: 2021-12-01 | Discharge status: Against Medical Advice | Payer: Medicare Other | Attending: Emergency Medicine | Admitting: Emergency Medicine

## 2021-12-01 ENCOUNTER — Encounter: Payer: Self-pay | Admitting: Internal Medicine

## 2021-12-01 DIAGNOSIS — N179 Acute kidney failure, unspecified: Secondary | ICD-10-CM | POA: Diagnosis not present

## 2021-12-01 DIAGNOSIS — R531 Weakness: Secondary | ICD-10-CM | POA: Diagnosis not present

## 2021-12-01 DIAGNOSIS — E2749 Other adrenocortical insufficiency: Secondary | ICD-10-CM | POA: Diagnosis not present

## 2021-12-01 DIAGNOSIS — E039 Hypothyroidism, unspecified: Secondary | ICD-10-CM | POA: Diagnosis not present

## 2021-12-01 DIAGNOSIS — Z7952 Long term (current) use of systemic steroids: Secondary | ICD-10-CM | POA: Diagnosis not present

## 2021-12-01 DIAGNOSIS — R634 Abnormal weight loss: Secondary | ICD-10-CM | POA: Diagnosis not present

## 2021-12-01 DIAGNOSIS — R2681 Unsteadiness on feet: Secondary | ICD-10-CM | POA: Diagnosis not present

## 2021-12-01 DIAGNOSIS — Z85820 Personal history of malignant melanoma of skin: Secondary | ICD-10-CM | POA: Diagnosis not present

## 2021-12-01 DIAGNOSIS — R1013 Epigastric pain: Secondary | ICD-10-CM | POA: Diagnosis not present

## 2021-12-01 DIAGNOSIS — K259 Gastric ulcer, unspecified as acute or chronic, without hemorrhage or perforation: Secondary | ICD-10-CM | POA: Diagnosis not present

## 2021-12-01 DIAGNOSIS — R110 Nausea: Secondary | ICD-10-CM | POA: Diagnosis not present

## 2021-12-01 DIAGNOSIS — Z79899 Other long term (current) drug therapy: Secondary | ICD-10-CM | POA: Diagnosis not present

## 2021-12-01 DIAGNOSIS — Z8719 Personal history of other diseases of the digestive system: Secondary | ICD-10-CM | POA: Diagnosis not present

## 2021-12-01 DIAGNOSIS — Z91148 Patient's other noncompliance with medication regimen for other reason: Secondary | ICD-10-CM | POA: Diagnosis not present

## 2021-12-01 DIAGNOSIS — E860 Dehydration: Secondary | ICD-10-CM | POA: Diagnosis not present

## 2021-12-01 DIAGNOSIS — Z9884 Bariatric surgery status: Secondary | ICD-10-CM | POA: Diagnosis not present

## 2021-12-01 DIAGNOSIS — Z6823 Body mass index (BMI) 23.0-23.9, adult: Secondary | ICD-10-CM | POA: Diagnosis not present

## 2021-12-01 DIAGNOSIS — R627 Adult failure to thrive: Secondary | ICD-10-CM | POA: Diagnosis not present

## 2021-12-01 DIAGNOSIS — Z20822 Contact with and (suspected) exposure to covid-19: Secondary | ICD-10-CM | POA: Diagnosis not present

## 2021-12-01 DIAGNOSIS — R42 Dizziness and giddiness: Secondary | ICD-10-CM | POA: Diagnosis not present

## 2021-12-01 DIAGNOSIS — Z5321 Procedure and treatment not carried out due to patient leaving prior to being seen by health care provider: Secondary | ICD-10-CM | POA: Diagnosis not present

## 2021-12-01 DIAGNOSIS — N400 Enlarged prostate without lower urinary tract symptoms: Secondary | ICD-10-CM | POA: Diagnosis not present

## 2021-12-01 DIAGNOSIS — I10 Essential (primary) hypertension: Secondary | ICD-10-CM | POA: Diagnosis not present

## 2021-12-01 DIAGNOSIS — E038 Other specified hypothyroidism: Secondary | ICD-10-CM | POA: Diagnosis not present

## 2021-12-01 DIAGNOSIS — E2740 Unspecified adrenocortical insufficiency: Secondary | ICD-10-CM | POA: Diagnosis not present

## 2021-12-01 DIAGNOSIS — M810 Age-related osteoporosis without current pathological fracture: Secondary | ICD-10-CM | POA: Diagnosis not present

## 2021-12-01 DIAGNOSIS — E43 Unspecified severe protein-calorie malnutrition: Secondary | ICD-10-CM | POA: Diagnosis not present

## 2021-12-01 DIAGNOSIS — F39 Unspecified mood [affective] disorder: Secondary | ICD-10-CM | POA: Diagnosis not present

## 2021-12-01 DIAGNOSIS — F419 Anxiety disorder, unspecified: Secondary | ICD-10-CM | POA: Diagnosis not present

## 2021-12-01 NOTE — ED Notes (Signed)
Pt sts he is leaving and going to [facility]

## 2021-12-01 NOTE — ED Triage Notes (Signed)
Pt reports 10lb weight loss 2-3 days with weakness, unsteady gait, "head being foggy" Ca patient at [place]. Currently not on chemo or radiation.

## 2021-12-03 MED ORDER — OXYCODONE HCL 5 MG PO TABS: 10.0000 mg | ORAL_TABLET | Freq: Four times a day (QID) | ORAL | 0 refills | Status: DC | PRN

## 2021-12-04 ENCOUNTER — Telehealth: Payer: Self-pay | Admitting: Internal Medicine

## 2021-12-04 ENCOUNTER — Ambulatory Visit: Payer: Medicare Other | Admitting: Physician Assistant

## 2021-12-04 NOTE — Telephone Encounter (Signed)
Left message for patient to call back to schedule Medicare Annual Wellness Visit   Last AWV  12/10/20  Please schedule at anytime with [facility] [employer] if patient calls the office back.     Any questions, please call me at [phone]

## 2021-12-05 ENCOUNTER — Ambulatory Visit: Payer: Medicare Other

## 2021-12-07 DIAGNOSIS — I493 Ventricular premature depolarization: Secondary | ICD-10-CM | POA: Diagnosis not present

## 2021-12-12 ENCOUNTER — Ambulatory Visit (INDEPENDENT_AMBULATORY_CARE_PROVIDER_SITE_OTHER): Payer: Medicare Other

## 2021-12-12 VITALS — BP 110/60 | HR 50

## 2021-12-12 DIAGNOSIS — E038 Other specified hypothyroidism: Secondary | ICD-10-CM | POA: Diagnosis not present

## 2021-12-12 MED ORDER — DENOSUMAB 60 MG/ML ~~LOC~~ SOSY
60.0000 mg | PREFILLED_SYRINGE | Freq: Once | SUBCUTANEOUS | Status: AC
  Administered 2021-12-12: 60 mg via SUBCUTANEOUS

## 2021-12-12 NOTE — Progress Notes (Signed)
Patient verbally confirmed name, date of birth, and correct medication to be administered. Prolia Injection administered in right arm and pt tolerated well.

## 2021-12-26 NOTE — Telephone Encounter (Signed)
Last Prolia inj 12/12/21 Next Prolia inj due 06/13/22

## 2021-12-31 ENCOUNTER — Encounter: Payer: Self-pay | Admitting: Internal Medicine

## 2021-12-31 MED ORDER — OXYCODONE HCL 5 MG PO TABS: 10.0000 mg | ORAL_TABLET | Freq: Four times a day (QID) | ORAL | 0 refills | Status: DC | PRN

## 2022-01-01 DIAGNOSIS — D225 Melanocytic nevi of trunk: Secondary | ICD-10-CM | POA: Diagnosis not present

## 2022-01-01 DIAGNOSIS — L821 Other seborrheic keratosis: Secondary | ICD-10-CM | POA: Diagnosis not present

## 2022-01-01 DIAGNOSIS — Z85820 Personal history of malignant melanoma of skin: Secondary | ICD-10-CM | POA: Diagnosis not present

## 2022-01-01 DIAGNOSIS — L814 Other melanin hyperpigmentation: Secondary | ICD-10-CM | POA: Diagnosis not present

## 2022-01-01 DIAGNOSIS — Z08 Encounter for follow-up examination after completed treatment for malignant neoplasm: Secondary | ICD-10-CM | POA: Diagnosis not present

## 2022-01-07 ENCOUNTER — Other Ambulatory Visit: Payer: Self-pay

## 2022-01-07 ENCOUNTER — Other Ambulatory Visit: Payer: Self-pay | Admitting: Internal Medicine

## 2022-01-08 ENCOUNTER — Telehealth: Payer: Self-pay | Admitting: Internal Medicine

## 2022-01-08 NOTE — Telephone Encounter (Signed)
LVM for pt to rtn my call to schedule AWV with [facility] call back # [phone] 

## 2022-01-13 ENCOUNTER — Ambulatory Visit: Payer: Medicare Other | Admitting: Internal Medicine

## 2022-01-13 NOTE — Patient Instructions (Addendum)
      Blood work was ordered.   The lab is on the first floor.    Medications changes include :   decrease oxycodone to 30 mg / day - can take 5-10 mg three times a day as needed or break it up as needed - do not exceed 6 pills/ day    A referral was ordered for XXX.     Someone will call you to schedule an appointment.    Return in about 3 months (around 04/15/2022) for follow up.

## 2022-01-13 NOTE — Progress Notes (Unsigned)
Subjective:    Patient ID: Maxwell Fox, male    DOB: Nov 21, 1945, 75 y.o.   MRN: [id number]     HPI Maxwell Fox is here for follow up of his chronic medical problems, including chronic pain syndrome, anxeity, depression, metastatic melanoma in remission  Currently taking oxycodone 5 mg - total of 35 mg /day and has done that for 3 months.  Plan was to decrease by 5 mg to 30 mg - 5-10 mg three times day    Medications and allergies reviewed with patient and updated if appropriate.  Current Outpatient Medications on File Prior to Visit  Medication Sig Dispense Refill   acetaminophen (TYLENOL) 325 MG tablet Take 2 tablets (650 mg total) by mouth every 6 (six) hours as needed for moderate pain.     calcium carbonate (OS-CAL - DOSED IN MG OF ELEMENTAL CALCIUM) 1250 (500 Ca) MG tablet Take 1 tablet by mouth daily with supper.     Cholecalciferol (VITAMIN D) 2000 UNITS tablet Take 1 tablet (2,000 Units total) by mouth daily. 30 tablet 11   clonazePAM (KLONOPIN) 1 MG tablet TAKE 1 TABLET(1 MG) BY MOUTH TWICE DAILY AS NEEDED FOR ANXIETY 60 tablet 2   FLUoxetine (PROZAC) 20 MG capsule Take 1 capsule (20 mg total) by mouth daily. 90 capsule 3   levothyroxine (SYNTHROID) 88 MCG tablet TAKE 1 TABLET(88 MCG) BY MOUTH DAILY BEFORE AND BREAKFAST 90 tablet 3   Multiple Vitamin (MULTIVITAMIN WITH MINERALS) TABS tablet Take 1 tablet by mouth daily.     ondansetron (ZOFRAN) 4 MG tablet TAKE 1 TABLET(4 MG) BY MOUTH EVERY 8 HOURS AS NEEDED FOR NAUSEA OR VOMITING 20 tablet 1   oxyCODONE (ROXICODONE) 5 MG immediate release tablet Take 2 tablets (10 mg total) by mouth every 6 (six) hours as needed for severe pain. 210 tablet 0   predniSONE (DELTASONE) 5 MG tablet TAKE 1 TABLET BY MOUTH EVERY DAY WITH BREAKFAST 90 tablet 3   tamsulosin (FLOMAX) 0.4 MG CAPS capsule TAKE 1 CAPSULE BY MOUTH EVERY DAY AFTER SUPPER 90 capsule 1   triamcinolone cream (KENALOG) 0.1 % as needed.     No current  facility-administered medications on file prior to visit.     Review of Systems     Objective:  There were no vitals filed for this visit. BP Readings from Last 3 Encounters:  12/12/21 110/60  12/01/21 (!) 124/59  11/04/21 110/60   Wt Readings from Last 3 Encounters:  11/04/21 173 lb 6.4 oz (78.7 kg)  10/14/21 174 lb (78.9 kg)  07/18/21 176 lb 9.6 oz (80.1 kg)   There is no height or weight on file to calculate BMI.    Physical Exam     Lab Results  Component Value Date   WBC 7.1 05/14/2020   HGB 12.1 (L) 05/14/2020   HCT 37.4 (L) 05/14/2020   PLT 185.0 05/14/2020   GLUCOSE 74 04/01/2020   CHOL 195 01/07/2017   TRIG 152.0 (H) 01/07/2017   HDL 73.20 01/07/2017   LDLCALC 91 01/07/2017   ALT 14 03/27/2020   AST 24 03/27/2020   NA 141 04/01/2020   K 3.6 04/01/2020   CL 108 04/01/2020   CREATININE 0.95 04/01/2020   BUN 13 04/01/2020   CO2 27 04/01/2020   TSH 0.234 (L) 03/04/2020   INR 1.2 02/26/2020   HGBA1C 4.7 (L) 02/14/2020     Assessment & Plan:    See Problem List for Assessment and Plan of chronic medical  problems.

## 2022-01-14 ENCOUNTER — Ambulatory Visit (INDEPENDENT_AMBULATORY_CARE_PROVIDER_SITE_OTHER): Payer: Medicare Other | Admitting: Internal Medicine

## 2022-01-14 ENCOUNTER — Encounter: Payer: Self-pay | Admitting: Internal Medicine

## 2022-01-14 VITALS — BP 108/70 | HR 65 | Temp 98.0°F | Ht 71.0 in | Wt 171.0 lb

## 2022-01-14 DIAGNOSIS — K2100 Gastro-esophageal reflux disease with esophagitis, without bleeding: Secondary | ICD-10-CM | POA: Diagnosis not present

## 2022-01-14 DIAGNOSIS — F419 Anxiety disorder, unspecified: Secondary | ICD-10-CM

## 2022-01-14 DIAGNOSIS — G894 Chronic pain syndrome: Secondary | ICD-10-CM

## 2022-01-14 DIAGNOSIS — F32A Depression, unspecified: Secondary | ICD-10-CM

## 2022-01-14 MED ORDER — FLUOXETINE HCL 20 MG PO CAPS: 20.0000 mg | ORAL_CAPSULE | Freq: Every day | ORAL | 3 refills | Status: DC

## 2022-01-14 MED ORDER — OXYCODONE HCL 5 MG PO TABS: 5.0000 mg | ORAL_TABLET | Freq: Three times a day (TID) | ORAL | 0 refills | Status: DC | PRN

## 2022-01-14 MED ORDER — OMEPRAZOLE 40 MG PO CPDR: 40.0000 mg | DELAYED_RELEASE_CAPSULE | Freq: Every day | ORAL | 3 refills | Status: DC

## 2022-01-14 NOTE — Assessment & Plan Note (Signed)
Chronic On chronic narcotics-oxycodone-we are slowly trying to taper him off medication Currently taking oxycodone 5 mg pills for up to 35 mg/day.  He is dividing the medication as he wishes, but not to exceed 35 mg in 1 day He has been doing the above dose for 3 months Will decrease to oxycodone 30 mg maximum per day-using 5 mg pills he can divide this up as he wishes, but stressed that he cannot exceed 30 mg/day-he knows that I will not fill this early [place] controlled substance database checked today-last refilled 11/30-refill due 12/28

## 2022-01-14 NOTE — Assessment & Plan Note (Signed)
Chronic History of GERD, but also history of bleeding at the site of his gastric bypass Needs to remain on omeprazole-has been out for a few days and needs to follow-up with his gastrointestinal doctor Omeprazole 40 mg daily sent to pharmacy

## 2022-01-14 NOTE — Assessment & Plan Note (Addendum)
Chronic Fairly controlled He did run out of his fluoxetine and needs a refill-he has noticed increased anxiety since not taking that for a few days Continue fluoxetine 20 mg daily and clonazepam 1 mg twice daily Discussed that we can increase the fluoxetine at some point if needed

## 2022-01-19 DIAGNOSIS — Z923 Personal history of irradiation: Secondary | ICD-10-CM | POA: Diagnosis not present

## 2022-01-19 DIAGNOSIS — Z08 Encounter for follow-up examination after completed treatment for malignant neoplasm: Secondary | ICD-10-CM | POA: Diagnosis not present

## 2022-01-19 DIAGNOSIS — G9389 Other specified disorders of brain: Secondary | ICD-10-CM | POA: Diagnosis not present

## 2022-01-19 DIAGNOSIS — Z85830 Personal history of malignant neoplasm of bone: Secondary | ICD-10-CM | POA: Diagnosis not present

## 2022-01-19 DIAGNOSIS — Z85841 Personal history of malignant neoplasm of brain: Secondary | ICD-10-CM | POA: Diagnosis not present

## 2022-01-19 DIAGNOSIS — C7951 Secondary malignant neoplasm of bone: Secondary | ICD-10-CM | POA: Diagnosis not present

## 2022-01-19 DIAGNOSIS — C7931 Secondary malignant neoplasm of brain: Secondary | ICD-10-CM | POA: Diagnosis not present

## 2022-01-19 DIAGNOSIS — Z9889 Other specified postprocedural states: Secondary | ICD-10-CM | POA: Diagnosis not present

## 2022-01-21 ENCOUNTER — Encounter: Payer: Self-pay | Admitting: Physician Assistant

## 2022-01-21 ENCOUNTER — Ambulatory Visit (INDEPENDENT_AMBULATORY_CARE_PROVIDER_SITE_OTHER): Payer: Medicare Other | Admitting: Physician Assistant

## 2022-01-21 ENCOUNTER — Other Ambulatory Visit (INDEPENDENT_AMBULATORY_CARE_PROVIDER_SITE_OTHER): Payer: Medicare Other

## 2022-01-21 VITALS — BP 126/68 | HR 63 | Ht 71.0 in | Wt 173.0 lb

## 2022-01-21 DIAGNOSIS — D649 Anemia, unspecified: Secondary | ICD-10-CM

## 2022-01-21 DIAGNOSIS — D509 Iron deficiency anemia, unspecified: Secondary | ICD-10-CM | POA: Diagnosis not present

## 2022-01-21 DIAGNOSIS — K289 Gastrojejunal ulcer, unspecified as acute or chronic, without hemorrhage or perforation: Secondary | ICD-10-CM | POA: Diagnosis not present

## 2022-01-21 DIAGNOSIS — E538 Deficiency of other specified B group vitamins: Secondary | ICD-10-CM | POA: Diagnosis not present

## 2022-01-21 LAB — CBC WITH DIFFERENTIAL/PLATELET
Basophils Absolute: 0.1 10*3/uL (ref 0.0–0.1)
Basophils Relative: 1 % (ref 0.0–3.0)
Eosinophils Absolute: 0.1 10*3/uL (ref 0.0–0.7)
Eosinophils Relative: 1.7 % (ref 0.0–5.0)
HCT: 39.7 % (ref 39.0–52.0)
Hemoglobin: 13.2 g/dL (ref 13.0–17.0)
Lymphocytes Relative: 26.9 % (ref 12.0–46.0)
Lymphs Abs: 1.9 10*3/uL (ref 0.7–4.0)
MCHC: 33.3 g/dL (ref 30.0–36.0)
MCV: 87.2 fl (ref 78.0–100.0)
Monocytes Absolute: 0.8 10*3/uL (ref 0.1–1.0)
Monocytes Relative: 11.1 % (ref 3.0–12.0)
Neutro Abs: 4.1 10*3/uL (ref 1.4–7.7)
Neutrophils Relative %: 59.3 % (ref 43.0–77.0)
Platelets: 211 10*3/uL (ref 150.0–400.0)
RBC: 4.55 Mil/uL (ref 4.22–5.81)
RDW: 16.1 % — ABNORMAL HIGH (ref 11.5–15.5)
WBC: 6.9 10*3/uL (ref 4.0–10.5)

## 2022-01-21 LAB — IBC + FERRITIN
Ferritin: 49.1 ng/mL (ref 22.0–322.0)
Iron: 50 ug/dL (ref 42–165)
Saturation Ratios: 16.5 % — ABNORMAL LOW (ref 20.0–50.0)
TIBC: 302.4 ug/dL (ref 250.0–450.0)
Transferrin: 216 mg/dL (ref 212.0–360.0)

## 2022-01-21 LAB — VITAMIN B12: Vitamin B-12: >1500 pg/mL — ABNORMAL HIGH (ref 211–911)

## 2022-01-21 LAB — FOLATE: Folate: >23.8 ng/mL (ref >5.9)

## 2022-01-21 MED ORDER — OMEPRAZOLE 40 MG PO CPDR: 40.0000 mg | DELAYED_RELEASE_CAPSULE | Freq: Every day | ORAL | 3 refills | Status: DC

## 2022-01-21 NOTE — Progress Notes (Signed)
Subjective:    Patient ID: Maxwell Fox, male    DOB: [birth year], 76 y.o.   MRN: [id number]  HPI Earlie is a pleasant 76 year old white male established with Dr. Hilarie Fredrickson who comes in today for follow-up and medication refill.  He has history of chronic GERD, metastatic melanoma to the brain for which she is followed at [facility].  Very recent evaluation with stable disease.  Also with hypothyroidism, history of adrenal insufficiency, BPH, prior history of B12 and iron deficiency, chronic pain syndrome, anxiety/depression and is status post gastric bypass Roux-en-Y. He had an admission in February 2022 with acute GI bleed found secondary to an anastomotic ulcer with visible vessel.  This was treated with endoscopic therapy.  Unfortunately he required a readmission 2 days after discharge with recurrent melena and had repeat EGD which showed a healing marginal ulcer.  He was treated with twice daily omeprazole, to open capsule and take with applesauce. He had repeat EGD to confirm healing in May 2022 which showed a healthy anastomosis with visible suture present, no evidence of any residual ulceration. He last had colonoscopy February 2022 with finding of multiple small enlarged mouth diverticuli in the rectosigmoid and sigmoid and nonbleeding internal hemorrhoids  He had been on omeprazole once daily since that time at 40 mg with opening of capsule.  He says he ran out of medication about a month ago and has been frustrated because when he called he was not given any refills.  Since then he had been able to get a refill from his PCP.  Shortly after that time he was hospitalized at [facility] for severe weakness He says that he had workup there with no definite diagnosis and it was thought perhaps he had a viral syndrome.  He did not have any evidence of GI bleeding.  Most recent labs here 12/03/2021 hemoglobin 13.5/hematocrit 41.4/platelets 157 He has not had iron studies or B12 level done since February  2023.  He says he usually feels fine as long as he is on the omeprazole.  Review of Systems Pertinent positive and negative review of systems were noted in the above HPI section.  All other review of systems was otherwise negative.   Outpatient Encounter Medications as of 01/21/2022  Medication Sig   acetaminophen (TYLENOL) 325 MG tablet Take 2 tablets (650 mg total) by mouth every 6 (six) hours as needed for moderate pain.   calcium carbonate (OS-CAL - DOSED IN MG OF ELEMENTAL CALCIUM) 1250 (500 Ca) MG tablet Take 1 tablet by mouth daily with supper.   Cholecalciferol (VITAMIN D) 2000 UNITS tablet Take 1 tablet (2,000 Units total) by mouth daily.   clonazePAM (KLONOPIN) 1 MG tablet TAKE 1 TABLET(1 MG) BY MOUTH TWICE DAILY AS NEEDED FOR ANXIETY   FLUoxetine (PROZAC) 20 MG capsule Take 1 capsule (20 mg total) by mouth daily.   levothyroxine (SYNTHROID) 88 MCG tablet TAKE 1 TABLET(88 MCG) BY MOUTH DAILY BEFORE AND BREAKFAST   Multiple Vitamin (MULTIVITAMIN WITH MINERALS) TABS tablet Take 1 tablet by mouth daily.   ondansetron (ZOFRAN) 4 MG tablet TAKE 1 TABLET(4 MG) BY MOUTH EVERY 8 HOURS AS NEEDED FOR NAUSEA OR VOMITING   oxyCODONE (ROXICODONE) 5 MG immediate release tablet Take 1-2 tablets (5-10 mg total) by mouth every 8 (eight) hours as needed for severe pain.   predniSONE (DELTASONE) 5 MG tablet TAKE 1 TABLET BY MOUTH EVERY DAY WITH BREAKFAST   sucralfate (CARAFATE) 1 GM/10ML suspension Take 1 g by mouth 4 (four) times  daily -  with meals and at bedtime.   tamsulosin (FLOMAX) 0.4 MG CAPS capsule TAKE 1 CAPSULE BY MOUTH EVERY DAY AFTER SUPPER   triamcinolone cream (KENALOG) 0.1 % as needed.   [DISCONTINUED] omeprazole (PRILOSEC) 40 MG capsule Take 1 capsule (40 mg total) by mouth daily.   omeprazole (PRILOSEC) 40 MG capsule Take 1 capsule (40 mg total) by mouth daily.   [DISCONTINUED] omeprazole (PRILOSEC) 40 MG capsule Take 1 capsule (40 mg total) by mouth daily.   No  facility-administered encounter medications on file as of 01/21/2022.   No Known Allergies Patient Active Problem List   Diagnosis Date Noted   Age-related osteoporosis without current pathological fracture  04/28/2021   Chronic pain syndrome 08/24/2020   History of Roux-en-Y gastric bypass    H/O: upper GI bleed 02/14/2020   Gastroesophageal reflux disease with esophagitis without hemorrhage    Closed fracture of left superior pubic ramus (HCC) 02/09/2020   Anxiety and depression 01/10/2018   Chronic midline low back pain without sciatica 04/19/2017   Adrenal insufficiency due to cancer therapy ([place]) 07/27/2016   Secondary hypothyroidism 07/27/2016   H/O gastric bypass 01/06/2016   Pancreatic cyst 01/06/2016   Metastatic melanoma to lymph node (HCC)-[facility] 12/31/2015   Iron deficiency anemia    B12 nutritional deficiency    Benign prostatic hyperplasia    Social History   Socioeconomic History   Marital status: Married    Spouse name: Not on file   Number of children: 2   Years of education: Not on file   Highest education level: Not on file  Occupational History   Occupation: general contractor-retired  Tobacco Use   Smoking status: Former    Types: Cigarettes    Quit date: 02/03/1963    Years since quitting: 59.0   Smokeless tobacco: Never  Vaping Use   Vaping Use: Never used  Substance and Sexual Activity   Alcohol use: Not Currently    Comment: wine 2x/week   Drug use: Never   Sexual activity: Not on file  Other Topics Concern   Not on file  Social History Narrative   Lives with wife   Retired - Conservator, 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 [place] Strain: [place]  (12/10/2020)   Overall Financial Resource Strain (CARDIA)    Difficulty of Paying Living Expenses: Not hard at all  Food Insecurity: No Food Insecurity (02/23/2020)   Hunger Vital Sign    Worried About Running Out of Food in the  Last Year: Never true    Ran Out of Food in the Last Year: Never true  Transportation Needs: Unknown (12/10/2020)   PRAPARE - Hydrologist (Medical): No    Lack of Transportation (Non-Medical): Not on file  Physical Activity: Sufficiently Active (12/10/2020)   Exercise Vital Sign    Days of Exercise per Week: 5 days    Minutes of Exercise per Session: 30 min  Stress: No Stress Concern Present (12/10/2020)   [place]    Feeling of Stress : Not at all  Social Connections: [place] (12/10/2020)   Social Connection and Isolation Panel [NHANES]    Frequency of Communication with Friends and Family: More than three times a week    Frequency of Social Gatherings with Friends and Family: More than three times a week    Attends Religious Services:  More than 4 times per year    Active Member of Clubs or Organizations: Yes    Attends Archivist Meetings: More than 4 times per year    Marital Status: Married  Human resources officer Violence: Not At Risk (12/10/2020)   Humiliation, Afraid, Rape, and Kick questionnaire    Fear of Current or Ex-Partner: No    Emotionally Abused: No    Physically Abused: No    Sexually Abused: No    Mr. Fandino's family history includes Breast cancer in his sister; Hypertension in his father; Macular degeneration in his mother; Melanoma in his brother, mother, and sister; Stomach cancer (age of onset: 23) in his sister; Uterine cancer in his sister.      Objective:    Vitals:   01/21/22 1446  BP: 126/68  Pulse: 63    Physical Exam Well-developed well-nourished elderly white male in no acute distress.  Height, Weight, 173 BMI 24.1  HEENT; nontraumatic normocephalic, EOMI, PE R LA, sclera anicteric. Oropharynx; not examined today Neck; supple, no JVD Cardiovascular; regular rate and rhythm with S1-S2, no murmur rub or gallop Pulmonary; Clear  bilaterally Abdomen; soft, nontender, nondistended, no palpable mass or hepatosplenomegaly, bowel sounds are active Rectal; not done today Skin; benign exam, no jaundice rash or appreciable lesions Extremities; no clubbing cyanosis or edema skin warm and dry Neuro/Psych; alert and oriented x4, grossly nonfocal mood and affect appropriate        Assessment & Plan:   #63 76 year old white male with history of chronic GERD and prior admission February 2022 with upper GI bleed secondary to anastomotic ulcer with visible vessel in the setting of remote Roux-en-Y gastric bypass.  He required treatment with endoscopic therapy and then had a readmission with recurrent melena and repeat EGD showing a healing marginal ulcer. He then had confirmed healing at EGD May 2022 and has been on once daily omeprazole 40 mg since with instructions to open capsule and take with applesauce.  He recently ran out of medication and expressed frustration at inability to get refills so was off medication for about a month.  #2 diverticulosis 3.  History of metastatic melanoma to the brain, stable disease at very recent reassessment 4.  Hypothyroidism 5.  History of adrenal insufficiency 6.  Chronic pain syndrome 7.  Anxiety/depression 8.  BPH  Plan; have refilled omeprazole 40 mg 1 p.o. every morning open capsule and take with liquid or applesauce #90 and 4 refills.  In the future of asked him to contact Dr. Vena Rua nurse or my nurse if he has difficulty getting refills while waiting to come in for a follow-up appointment.  Will repeat CBC today and check B12 level, serum iron, TIBC and ferritin. Follow-up with Dr. Hilarie Fredrickson or myself on an as-needed basis.  Alfredia Ferguson PA-C 01/21/2022   Cc: [name], MD

## 2022-01-21 NOTE — Patient Instructions (Signed)
_______________________________________________________  If you are age 76 or older, your body mass index should be between 23-30. Your Body mass index is 24.13 kg/m. If this is out of the aforementioned range listed, please consider follow up with your Primary Care Provider.  If you are age 9 or younger, your body mass index should be between 19-25. Your Body mass index is 24.13 kg/m. If this is out of the aformentioned range listed, please consider follow up with your Primary Care Provider.   ________________________________________________________  The [facility] providers would like to encourage you to use [facility] to communicate with providers for non-urgent requests or questions.  Due to long hold times on the telephone, sending your provider a message by [facility] may be a faster and more efficient way to get a response.  Please allow 48 business hours for a response.  Please remember that this is for non-urgent requests.  _______________________________________________________  Your provider has requested that you go to the basement level for lab work before leaving today. Press "B" on the elevator. The lab is located at the first door on the left as you exit the elevator.   We have sent the following medications to your pharmacy for you to pick up at your convenience:  Omeprazole - open capsule and take with applesauce or liquid

## 2022-01-22 NOTE — Progress Notes (Signed)
Addendum: Reviewed and agree with assessment and management plan. Dayane Hillenburg M, MD  

## 2022-01-28 ENCOUNTER — Encounter: Payer: Self-pay | Admitting: Internal Medicine

## 2022-01-29 ENCOUNTER — Telehealth: Payer: Self-pay

## 2022-01-29 ENCOUNTER — Other Ambulatory Visit: Payer: Self-pay | Admitting: Internal Medicine

## 2022-01-29 DIAGNOSIS — G893 Neoplasm related pain (acute) (chronic): Secondary | ICD-10-CM

## 2022-01-29 DIAGNOSIS — S32512D Fracture of superior rim of left pubis, subsequent encounter for fracture with routine healing: Secondary | ICD-10-CM

## 2022-01-29 DIAGNOSIS — G8929 Other chronic pain: Secondary | ICD-10-CM

## 2022-01-29 DIAGNOSIS — G894 Chronic pain syndrome: Secondary | ICD-10-CM

## 2022-01-29 MED ORDER — OXYCODONE HCL 5 MG PO TABS: 5.0000 mg | ORAL_TABLET | Freq: Three times a day (TID) | ORAL | 0 refills | Status: DC | PRN

## 2022-02-16 ENCOUNTER — Other Ambulatory Visit: Payer: Self-pay | Admitting: Internal Medicine

## 2022-02-27 ENCOUNTER — Encounter: Payer: Self-pay | Admitting: Internal Medicine

## 2022-02-27 DIAGNOSIS — S32512D Fracture of superior rim of left pubis, subsequent encounter for fracture with routine healing: Secondary | ICD-10-CM

## 2022-02-27 DIAGNOSIS — G894 Chronic pain syndrome: Secondary | ICD-10-CM

## 2022-02-27 DIAGNOSIS — M545 Low back pain, unspecified: Secondary | ICD-10-CM

## 2022-02-27 DIAGNOSIS — G893 Neoplasm related pain (acute) (chronic): Secondary | ICD-10-CM

## 2022-02-27 MED ORDER — OXYCODONE HCL 5 MG PO TABS: 5.0000 mg | ORAL_TABLET | Freq: Three times a day (TID) | ORAL | 0 refills | Status: DC | PRN

## 2022-03-02 DIAGNOSIS — E2740 Unspecified adrenocortical insufficiency: Secondary | ICD-10-CM | POA: Diagnosis not present

## 2022-03-02 DIAGNOSIS — R7989 Other specified abnormal findings of blood chemistry: Secondary | ICD-10-CM | POA: Diagnosis not present

## 2022-03-02 DIAGNOSIS — C439 Malignant melanoma of skin, unspecified: Secondary | ICD-10-CM | POA: Diagnosis not present

## 2022-03-02 DIAGNOSIS — H209 Unspecified iridocyclitis: Secondary | ICD-10-CM | POA: Diagnosis not present

## 2022-03-02 DIAGNOSIS — Z9884 Bariatric surgery status: Secondary | ICD-10-CM | POA: Diagnosis not present

## 2022-03-02 DIAGNOSIS — E039 Hypothyroidism, unspecified: Secondary | ICD-10-CM | POA: Diagnosis not present

## 2022-03-02 DIAGNOSIS — Z9221 Personal history of antineoplastic chemotherapy: Secondary | ICD-10-CM | POA: Diagnosis not present

## 2022-03-02 DIAGNOSIS — C7931 Secondary malignant neoplasm of brain: Secondary | ICD-10-CM | POA: Diagnosis not present

## 2022-03-02 DIAGNOSIS — E230 Hypopituitarism: Secondary | ICD-10-CM | POA: Diagnosis not present

## 2022-03-02 DIAGNOSIS — R7401 Elevation of levels of liver transaminase levels: Secondary | ICD-10-CM | POA: Diagnosis not present

## 2022-03-02 DIAGNOSIS — G8929 Other chronic pain: Secondary | ICD-10-CM | POA: Diagnosis not present

## 2022-03-02 DIAGNOSIS — M2550 Pain in unspecified joint: Secondary | ICD-10-CM | POA: Diagnosis not present

## 2022-03-02 DIAGNOSIS — Z87891 Personal history of nicotine dependence: Secondary | ICD-10-CM | POA: Diagnosis not present

## 2022-03-02 DIAGNOSIS — Z7952 Long term (current) use of systemic steroids: Secondary | ICD-10-CM | POA: Diagnosis not present

## 2022-03-02 DIAGNOSIS — M549 Dorsalgia, unspecified: Secondary | ICD-10-CM | POA: Diagnosis not present

## 2022-03-13 ENCOUNTER — Encounter: Payer: Self-pay | Admitting: Internal Medicine

## 2022-03-16 ENCOUNTER — Encounter: Payer: Self-pay | Admitting: Internal Medicine

## 2022-03-16 NOTE — Progress Notes (Deleted)
    Subjective:    Patient ID: Maxwell Fox, male    DOB: 04/16/1945, 77 y.o.   MRN: [id number]      HPI Maxwell Fox is here for No chief complaint on file.   Anxiety -      Medications and allergies reviewed with patient and updated if appropriate.  Current Outpatient Medications on File Prior to Visit  Medication Sig Dispense Refill   acetaminophen (TYLENOL) 325 MG tablet Take 2 tablets (650 mg total) by mouth every 6 (six) hours as needed for moderate pain.     calcium carbonate (OS-CAL - DOSED IN MG OF ELEMENTAL CALCIUM) 1250 (500 Ca) MG tablet Take 1 tablet by mouth daily with supper.     Cholecalciferol (VITAMIN D) 2000 UNITS tablet Take 1 tablet (2,000 Units total) by mouth daily. 30 tablet 11   clonazePAM (KLONOPIN) 1 MG tablet TAKE 1 TABLET BY MOUTH TWICE DAILY AS NEEDED FOR ANXIETY 60 tablet 2   FLUoxetine (PROZAC) 20 MG capsule Take 1 capsule (20 mg total) by mouth daily. 90 capsule 3   levothyroxine (SYNTHROID) 88 MCG tablet TAKE 1 TABLET(88 MCG) BY MOUTH DAILY BEFORE AND BREAKFAST 90 tablet 3   Multiple Vitamin (MULTIVITAMIN WITH MINERALS) TABS tablet Take 1 tablet by mouth daily.     omeprazole (PRILOSEC) 40 MG capsule Take 1 capsule (40 mg total) by mouth daily. 90 capsule 3   ondansetron (ZOFRAN) 4 MG tablet TAKE 1 TABLET(4 MG) BY MOUTH EVERY 8 HOURS AS NEEDED FOR NAUSEA OR VOMITING 20 tablet 1   oxyCODONE (ROXICODONE) 5 MG immediate release tablet Take 1-2 tablets (5-10 mg total) by mouth every 8 (eight) hours as needed for severe pain. 180 tablet 0   predniSONE (DELTASONE) 5 MG tablet TAKE 1 TABLET BY MOUTH EVERY DAY WITH BREAKFAST 90 tablet 3   sucralfate (CARAFATE) 1 GM/10ML suspension Take 1 g by mouth 4 (four) times daily -  with meals and at bedtime.     tamsulosin (FLOMAX) 0.4 MG CAPS capsule TAKE 1 CAPSULE BY MOUTH EVERY DAY AFTER SUPPER 90 capsule 1   triamcinolone cream (KENALOG) 0.1 % as needed.     No current facility-administered medications on file  prior to visit.    Review of Systems     Objective:  There were no vitals filed for this visit. BP Readings from Last 3 Encounters:  01/21/22 126/68  01/14/22 108/70  12/12/21 110/60   Wt Readings from Last 3 Encounters:  01/21/22 173 lb (78.5 kg)  01/14/22 171 lb (77.6 kg)  11/04/21 173 lb 6.4 oz (78.7 kg)   There is no height or weight on file to calculate BMI.    Physical Exam         Assessment & Plan:    See Problem List for Assessment and Plan of chronic medical problems.

## 2022-03-17 ENCOUNTER — Ambulatory Visit: Payer: Medicare Other | Admitting: Internal Medicine

## 2022-03-27 ENCOUNTER — Encounter: Payer: Self-pay | Admitting: Internal Medicine

## 2022-03-27 DIAGNOSIS — G894 Chronic pain syndrome: Secondary | ICD-10-CM

## 2022-03-27 DIAGNOSIS — G8929 Other chronic pain: Secondary | ICD-10-CM

## 2022-03-27 DIAGNOSIS — S32512D Fracture of superior rim of left pubis, subsequent encounter for fracture with routine healing: Secondary | ICD-10-CM

## 2022-03-27 DIAGNOSIS — G893 Neoplasm related pain (acute) (chronic): Secondary | ICD-10-CM

## 2022-03-27 MED ORDER — OXYCODONE HCL 5 MG PO TABS: 5.0000 mg | ORAL_TABLET | Freq: Three times a day (TID) | ORAL | 0 refills | Status: DC | PRN

## 2022-04-02 NOTE — Telephone Encounter (Signed)
Taken care of

## 2022-04-10 DIAGNOSIS — Z9221 Personal history of antineoplastic chemotherapy: Secondary | ICD-10-CM | POA: Diagnosis not present

## 2022-04-14 ENCOUNTER — Ambulatory Visit: Payer: Medicare Other | Admitting: Internal Medicine

## 2022-04-15 DIAGNOSIS — Z23 Encounter for immunization: Secondary | ICD-10-CM | POA: Diagnosis not present

## 2022-04-15 DIAGNOSIS — S81812A Laceration without foreign body, left lower leg, initial encounter: Secondary | ICD-10-CM | POA: Diagnosis not present

## 2022-04-15 DIAGNOSIS — W540XXA Bitten by dog, initial encounter: Secondary | ICD-10-CM | POA: Diagnosis not present

## 2022-04-19 ENCOUNTER — Encounter: Payer: Self-pay | Admitting: Internal Medicine

## 2022-04-19 NOTE — Patient Instructions (Addendum)
        Medications changes include :   decrease oxycodone to 5 pills / day.  Start cymbalta ( duloxetine) 30 mg daily for depression/anxiety.  Start proscar 5 mg daily for your prostate.       Return in about 3 months (around 07/21/2022) for follow up.

## 2022-04-19 NOTE — Progress Notes (Unsigned)
Subjective:    Patient ID: Maxwell Fox, male    DOB: September 27, 1945, 77 y.o.   MRN: [id number]     HPI Maxwell Fox is here for follow up of his chronic medical problems.   Currently taking 6 pills of oxycodone / day.     Medications and allergies reviewed with patient and updated if appropriate.  Current Outpatient Medications on File Prior to Visit  Medication Sig Dispense Refill   acetaminophen (TYLENOL) 325 MG tablet Take 2 tablets (650 mg total) by mouth every 6 (six) hours as needed for moderate pain.     calcium carbonate (OS-CAL - DOSED IN MG OF ELEMENTAL CALCIUM) 1250 (500 Ca) MG tablet Take 1 tablet by mouth daily with supper.     Cholecalciferol (VITAMIN D) 2000 UNITS tablet Take 1 tablet (2,000 Units total) by mouth daily. 30 tablet 11   clonazePAM (KLONOPIN) 1 MG tablet TAKE 1 TABLET BY MOUTH TWICE DAILY AS NEEDED FOR ANXIETY 60 tablet 2   FLUoxetine (PROZAC) 20 MG capsule Take 1 capsule (20 mg total) by mouth daily. 90 capsule 3   levothyroxine (SYNTHROID) 88 MCG tablet TAKE 1 TABLET(88 MCG) BY MOUTH DAILY BEFORE AND BREAKFAST 90 tablet 3   Multiple Vitamin (MULTIVITAMIN WITH MINERALS) TABS tablet Take 1 tablet by mouth daily.     omeprazole (PRILOSEC) 40 MG capsule Take 1 capsule (40 mg total) by mouth daily. 90 capsule 3   ondansetron (ZOFRAN) 4 MG tablet TAKE 1 TABLET(4 MG) BY MOUTH EVERY 8 HOURS AS NEEDED FOR NAUSEA OR VOMITING 20 tablet 1   oxyCODONE (ROXICODONE) 5 MG immediate release tablet Take 1-2 tablets (5-10 mg total) by mouth every 8 (eight) hours as needed for severe pain. 180 tablet 0   predniSONE (DELTASONE) 5 MG tablet TAKE 1 TABLET BY MOUTH EVERY DAY WITH BREAKFAST 90 tablet 3   sucralfate (CARAFATE) 1 GM/10ML suspension Take 1 g by mouth 4 (four) times daily -  with meals and at bedtime.     tamsulosin (FLOMAX) 0.4 MG CAPS capsule TAKE 1 CAPSULE BY MOUTH EVERY DAY AFTER SUPPER 90 capsule 1   triamcinolone cream (KENALOG) 0.1 % as needed.     No  current facility-administered medications on file prior to visit.     Review of Systems     Objective:  There were no vitals filed for this visit. BP Readings from Last 3 Encounters:  01/21/22 126/68  01/14/22 108/70  12/12/21 110/60   Wt Readings from Last 3 Encounters:  01/21/22 173 lb (78.5 kg)  01/14/22 171 lb (77.6 kg)  11/04/21 173 lb 6.4 oz (78.7 kg)   There is no height or weight on file to calculate BMI.    Physical Exam     Lab Results  Component Value Date   WBC 6.9 01/21/2022   HGB 13.2 01/21/2022   HCT 39.7 01/21/2022   PLT 211.0 01/21/2022   GLUCOSE 74 04/01/2020   CHOL 195 01/07/2017   TRIG 152.0 (H) 01/07/2017   HDL 73.20 01/07/2017   LDLCALC 91 01/07/2017   ALT 14 03/27/2020   AST 24 03/27/2020   NA 141 04/01/2020   K 3.6 04/01/2020   CL 108 04/01/2020   CREATININE 0.95 04/01/2020   BUN 13 04/01/2020   CO2 27 04/01/2020   TSH 0.234 (L) 03/04/2020   INR 1.2 02/26/2020   HGBA1C 4.7 (L) 02/14/2020     Assessment & Plan:    See Problem List for Assessment and Plan  of chronic medical problems.

## 2022-04-20 ENCOUNTER — Ambulatory Visit (INDEPENDENT_AMBULATORY_CARE_PROVIDER_SITE_OTHER): Payer: Medicare Other | Admitting: Internal Medicine

## 2022-04-20 VITALS — BP 124/60 | HR 65 | Temp 98.3°F | Ht 71.0 in | Wt 174.0 lb

## 2022-04-20 DIAGNOSIS — N400 Enlarged prostate without lower urinary tract symptoms: Secondary | ICD-10-CM

## 2022-04-20 DIAGNOSIS — G894 Chronic pain syndrome: Secondary | ICD-10-CM | POA: Diagnosis not present

## 2022-04-20 DIAGNOSIS — K2100 Gastro-esophageal reflux disease with esophagitis, without bleeding: Secondary | ICD-10-CM | POA: Diagnosis not present

## 2022-04-20 DIAGNOSIS — G8929 Other chronic pain: Secondary | ICD-10-CM

## 2022-04-20 DIAGNOSIS — F32A Depression, unspecified: Secondary | ICD-10-CM

## 2022-04-20 DIAGNOSIS — F419 Anxiety disorder, unspecified: Secondary | ICD-10-CM | POA: Diagnosis not present

## 2022-04-20 DIAGNOSIS — R110 Nausea: Secondary | ICD-10-CM | POA: Diagnosis not present

## 2022-04-20 DIAGNOSIS — G893 Neoplasm related pain (acute) (chronic): Secondary | ICD-10-CM | POA: Diagnosis not present

## 2022-04-20 DIAGNOSIS — S32512D Fracture of superior rim of left pubis, subsequent encounter for fracture with routine healing: Secondary | ICD-10-CM

## 2022-04-20 MED ORDER — TAMSULOSIN HCL 0.4 MG PO CAPS: ORAL_CAPSULE | ORAL | 1 refills | Status: DC

## 2022-04-20 MED ORDER — ONDANSETRON HCL 4 MG PO TABS: ORAL_TABLET | ORAL | 1 refills | Status: DC

## 2022-04-20 MED ORDER — FINASTERIDE 5 MG PO TABS: 5.0000 mg | ORAL_TABLET | Freq: Every day | ORAL | 1 refills | Status: DC

## 2022-04-20 MED ORDER — CLONAZEPAM 1 MG PO TABS: ORAL_TABLET | ORAL | 2 refills | Status: DC

## 2022-04-20 MED ORDER — OXYCODONE HCL 5 MG PO TABS: 5.0000 mg | ORAL_TABLET | Freq: Every day | ORAL | 0 refills | Status: DC | PRN

## 2022-04-20 MED ORDER — DULOXETINE HCL 30 MG PO CPEP: 30.0000 mg | ORAL_CAPSULE | Freq: Every day | ORAL | 1 refills | Status: DC

## 2022-04-20 NOTE — Assessment & Plan Note (Signed)
Chronic States he is nauseated 2-3 times a week ?  Related to GERD versus other Will continue Zofran 4 mg every 8 hours as needed

## 2022-04-20 NOTE — Assessment & Plan Note (Addendum)
Chronic On chronic narcotics-oxycodone-we are slowly trying to taper him off medication Currently taking oxycodone 5 mg pills for up to 30 mg/day.  He is dividing the medication as he wishes, but not to exceed 30 mg in 1 day-realizes he does better when he sticks to a specific regimen with the pill dosing He has been doing the above dose for 3 months Will decrease to oxycodone 25 mg maximum per day-using 5 mg pills he can divide this up as he wishes, but stressed that he cannot exceed 25 mg/day-he knows that I will not fill this early-continue regimented dosing [place] controlled substance database checked. Will start Cymbalta 30 mg daily for anxiety, depression and hopefully will help some with his chronic pain

## 2022-04-20 NOTE — Assessment & Plan Note (Addendum)
Chronic Not controlled Feels depressed and anxious Continue clonazepam 1 mg twice daily Not taking fluoxetine - he did not feel any difference Start cymbalta 30 mg daily-hopefully this will help with his anxiety, depression and some pain

## 2022-04-20 NOTE — Assessment & Plan Note (Addendum)
Chronic Not emptying bladder completely Continue Flomax 0.4 mg daily Start proscar 5 mg daily

## 2022-04-20 NOTE — Assessment & Plan Note (Signed)
Chronic GERD controlled-denies any GERD He does have nausea 2-3 times a week-?  Related to pain medication or GERD Continue omeprazole 40 mg daily

## 2022-04-29 DIAGNOSIS — Z4802 Encounter for removal of sutures: Secondary | ICD-10-CM | POA: Diagnosis not present

## 2022-05-06 ENCOUNTER — Ambulatory Visit: Payer: Medicare Other | Admitting: Internal Medicine

## 2022-05-07 NOTE — Telephone Encounter (Signed)
Prolia VOB initiated via [email or web address]  Last OV: 11/04/21 Next OV:  Last Prolia inj 12/12/21 Next Prolia inj due 06/13/22

## 2022-05-16 ENCOUNTER — Encounter (HOSPITAL_COMMUNITY): Payer: Self-pay | Admitting: *Deleted

## 2022-05-16 ENCOUNTER — Other Ambulatory Visit: Payer: Self-pay

## 2022-05-16 ENCOUNTER — Emergency Department (HOSPITAL_COMMUNITY)
Admission: EM | Admit: 2022-05-16 | Discharge: 2022-05-17 | Disposition: A | Discharge status: Home / Self Care | Payer: Medicare Other | Attending: Emergency Medicine | Admitting: Emergency Medicine

## 2022-05-16 DIAGNOSIS — I6782 Cerebral ischemia: Secondary | ICD-10-CM | POA: Insufficient documentation

## 2022-05-16 DIAGNOSIS — Z85820 Personal history of malignant melanoma of skin: Secondary | ICD-10-CM | POA: Insufficient documentation

## 2022-05-16 DIAGNOSIS — Z79891 Long term (current) use of opiate analgesic: Secondary | ICD-10-CM | POA: Diagnosis not present

## 2022-05-16 DIAGNOSIS — Z85841 Personal history of malignant neoplasm of brain: Secondary | ICD-10-CM | POA: Diagnosis not present

## 2022-05-16 DIAGNOSIS — R42 Dizziness and giddiness: Secondary | ICD-10-CM | POA: Diagnosis not present

## 2022-05-16 DIAGNOSIS — G9389 Other specified disorders of brain: Secondary | ICD-10-CM | POA: Diagnosis not present

## 2022-05-16 DIAGNOSIS — I493 Ventricular premature depolarization: Secondary | ICD-10-CM | POA: Diagnosis not present

## 2022-05-16 DIAGNOSIS — I10 Essential (primary) hypertension: Secondary | ICD-10-CM | POA: Insufficient documentation

## 2022-05-16 DIAGNOSIS — R531 Weakness: Secondary | ICD-10-CM | POA: Diagnosis not present

## 2022-05-16 DIAGNOSIS — G4489 Other headache syndrome: Secondary | ICD-10-CM | POA: Diagnosis not present

## 2022-05-16 DIAGNOSIS — R778 Other specified abnormalities of plasma proteins: Secondary | ICD-10-CM | POA: Diagnosis not present

## 2022-05-16 DIAGNOSIS — R4182 Altered mental status, unspecified: Secondary | ICD-10-CM | POA: Diagnosis not present

## 2022-05-16 DIAGNOSIS — I959 Hypotension, unspecified: Secondary | ICD-10-CM | POA: Diagnosis not present

## 2022-05-16 DIAGNOSIS — R7989 Other specified abnormal findings of blood chemistry: Secondary | ICD-10-CM | POA: Diagnosis not present

## 2022-05-16 DIAGNOSIS — R519 Headache, unspecified: Secondary | ICD-10-CM | POA: Diagnosis not present

## 2022-05-16 DIAGNOSIS — R9431 Abnormal electrocardiogram [ECG] [EKG]: Secondary | ICD-10-CM | POA: Diagnosis not present

## 2022-05-16 DIAGNOSIS — G936 Cerebral edema: Secondary | ICD-10-CM | POA: Diagnosis not present

## 2022-05-16 MED ORDER — SODIUM CHLORIDE 0.9 % IV BOLUS
500.0000 mL | Freq: Once | INTRAVENOUS | Status: AC
  Administered 2022-05-16: 500 mL via INTRAVENOUS

## 2022-05-16 NOTE — ED Notes (Signed)
EMS REPORTS BP IN THE 80 S AT THE HOUSE THE PT WAS GIVEN IF NSS

## 2022-05-16 NOTE — ED Triage Notes (Signed)
The pt arrived by gems  from home where he lives with his wife  he has had a headache for 5 days  hx of brain cancer   that has been cured for 2 years  alert and oriented x 4   has been on oxicodone for years attempting to  stop taking them  he has not had any in 7 daYS  IV PER EMS

## 2022-05-16 NOTE — ED Provider Notes (Signed)
Emergency Department Provider Note   I have reviewed the triage vital signs and the nursing notes.   HISTORY  Chief Complaint Headache   HPI Maxwell Fox is a 77 y.o. male with past medical history of metastatic melanoma followed at [facility] presents to the ED for diffuse HA and generalized weakness. Symptoms progressing over the last 5 days. No fever. No new medications.  He has been weaning off of his chronic oxycodone with the help of his physician has been without medication for the past 4 to 6 days.  He states has been through withdrawal in the past and this does not feel similar to that.  Describes his headache is diffuse and "was just there" 5 days prior and hasn't gone away. He undergoes surveillance PET imaging and MRI brain at [facility] with last scan on 01/2022 showing no new lesions. Reports that over the last several days he is now requiring a walker to ambulate at home and when EMS arrived today he was unable to stand out of the chair to get on the stretcher.   Past Medical History:  Diagnosis Date   B12 nutritional deficiency    malabsorbtion   Blood transfusion without reported diagnosis 2022   BPH (benign prostatic hypertrophy)    Brain cancer 2017   removed "golf ball sized mass"   Depression    on meds   Diverticulosis    GERD (gastroesophageal reflux disease)    on meds   GI bleeding 02/2020   Hypertension    Iron deficiency anemia    malabsorption related (s/p gastric bypass)   Melanoma    Obesity    s/p gastric bypass 2000, start 355#   Pancreatic cyst    Pulmonary nodules    Thyroid disease    on meds    Review of Systems  Constitutional: No fever/chills Cardiovascular: Denies chest pain. Respiratory: Denies shortness of breath. Gastrointestinal: No abdominal pain.  No nausea, no vomiting.  No diarrhea.  No constipation. Genitourinary: Negative for dysuria. Musculoskeletal: Negative for back pain. Skin: Negative for rash. Neurological: Negative  for focal weakness or numbness. Positive HA. Positive generalized weakness.   ____________________________________________   PHYSICAL EXAM:  VITAL SIGNS: ED Triage Vitals  Enc Vitals Group     BP 05/16/22 2254 112/80     Pulse Rate 05/16/22 2254 74     Resp 05/16/22 2254 19     Temp 05/16/22 2254 97.9 F (36.6 C)     Temp Source 05/16/22 2254 Oral     SpO2 05/16/22 2254 100 %     Weight 05/16/22 2305 173 lb 15.1 oz (78.9 kg)     Height 05/16/22 2305 5\' 11"  (1.803 m)    Constitutional: Alert and oriented. Well appearing and in no acute distress. Eyes: Conjunctivae are normal.  Head: Atraumatic. Nose: No congestion/rhinnorhea. Mouth/Throat: Mucous membranes are moist.  Neck: No stridor.   Cardiovascular: Normal rate, regular rhythm. Good peripheral circulation. Grossly normal heart sounds.   Respiratory: Normal respiratory effort.  No retractions. Lungs CTAB. Gastrointestinal: Soft and nontender. No distention.  Musculoskeletal: No gross deformities of extremities. Neurologic:  Normal speech and language. No gross focal neurologic deficits are appreciated. No facial asymmetry. 5/5 strength in the bilateral upper/lower extremities.  Skin:  Skin is warm, dry and intact. No rash noted.   ____________________________________________   LABS (all labs ordered are listed, but only abnormal results are displayed)  Labs Reviewed  COMPREHENSIVE METABOLIC PANEL - Abnormal; Notable for the following components:  Result Value   Sodium 132 (*)    CO2 19 (*)    Calcium 8.1 (*)    Total Protein 5.2 (*)    Albumin 2.8 (*)    All other components within normal limits  CBC WITH DIFFERENTIAL/PLATELET - Abnormal; Notable for the following components:   WBC 3.1 (*)    RBC 4.03 (*)    Hemoglobin 11.8 (*)    HCT 35.2 (*)    Platelets 146 (*)    All other components within normal limits  RAPID URINE DRUG SCREEN, HOSP PERFORMED - Abnormal; Notable for the following components:    Benzodiazepines POSITIVE (*)    Tetrahydrocannabinol POSITIVE (*)    All other components within normal limits  TROPONIN I (HIGH SENSITIVITY) - Abnormal; Notable for the following components:   Troponin I (High Sensitivity) 73 (*)    All other components within normal limits  TROPONIN I (HIGH SENSITIVITY) - Abnormal; Notable for the following components:   Troponin I (High Sensitivity) 69 (*)    All other components within normal limits  URINALYSIS, W/ REFLEX TO CULTURE (INFECTION SUSPECTED)  TSH  ETHANOL   ____________________________________________  EKG   EKG Interpretation  Date/Time:  Saturday May 16 2022 23:38:29 EDT Ventricular Rate:  75 PR Interval:  213 QRS Duration: 104 QT Interval:  485 QTC Calculation: 542 R Axis:   -29 Text Interpretation: Sinus rhythm Multiform ventricular premature complexes Borderline prolonged PR interval Abnormal R-wave progression, early transition LVH with secondary repolarization abnormality Prolonged QT interval Confirmed by Alvino Blood ([id number]) on 05/17/2022 7:04:33 AM        ____________________________________________   PROCEDURES  Procedure(s) performed:   Procedures  None  ____________________________________________   INITIAL IMPRESSION / ASSESSMENT AND PLAN / ED COURSE  Pertinent labs & imaging results that were available during my care of the patient were reviewed by me and considered in my medical decision making (see chart for details).   This patient is Presenting for Evaluation of HA, which does require a range of treatment options, and is a complaint that involves a high risk of morbidity and mortality.  The Differential Diagnoses includes but is not exclusive to subarachnoid hemorrhage, meningitis, encephalitis, previous head trauma, cavernous venous thrombosis, muscle tension headache, glaucoma, temporal arteritis, migraine or migraine equivalent, etc.   Critical Interventions-    Medications  sodium  chloride 0.9 % bolus 500 mL (0 mLs Intravenous Stopped 05/17/22 0137)  gadobutrol (GADAVIST) 1 MMOL/ML injection 8 mL (8 mLs Intravenous Contrast Given 05/17/22 0810)    Reassessment after intervention:  symptoms improved.    I did obtain Additional Historical Information from daughter at bedside.   I decided to review pertinent External Data, and in summary patient followed by Oncology at [facility]. PET scan 03/02/22 and MRI brain w/ and w/o contrast 12/18//23.   Clinical Laboratory Tests Ordered, included UDS positive for THC and benzo. No AKI.   Radiologic Tests Ordered, included CT head. I independently interpreted the images and agree with radiology interpretation.   Cardiac Monitor Tracing which shows NSR.    Social Determinants of Health Risk denies EtOH or drug use.   Consult complete with  Medical Decision Making: Summary:  Patient presents to the ED with HA and generalized weakness. No fever. Non-focal neuro exam. Patient is weaning off oxycodone.   Reevaluation with update and discussion with patient. MRI pending. Care transferred to oncoming team.    Patient's presentation is most consistent with acute presentation with potential threat to life  or bodily function.   Disposition: pending  ____________________________________________  FINAL CLINICAL IMPRESSION(S) / ED DIAGNOSES  Final diagnoses:  Bad headache  Elevated troponin I level      Note:  This document was prepared using Dragon voice recognition software and may include unintentional dictation errors.  Alona Bene, MD, [facility] Emergency Medicine    Lilyanna Lunt, Arlyss Repress, MD 05/21/22 [id number]

## 2022-05-17 ENCOUNTER — Emergency Department (HOSPITAL_COMMUNITY): Payer: Medicare Other

## 2022-05-17 DIAGNOSIS — R531 Weakness: Secondary | ICD-10-CM | POA: Diagnosis not present

## 2022-05-17 DIAGNOSIS — R519 Headache, unspecified: Secondary | ICD-10-CM | POA: Diagnosis not present

## 2022-05-17 DIAGNOSIS — G9389 Other specified disorders of brain: Secondary | ICD-10-CM | POA: Diagnosis not present

## 2022-05-17 DIAGNOSIS — G936 Cerebral edema: Secondary | ICD-10-CM | POA: Diagnosis not present

## 2022-05-17 DIAGNOSIS — R4182 Altered mental status, unspecified: Secondary | ICD-10-CM | POA: Diagnosis not present

## 2022-05-17 LAB — TROPONIN I (HIGH SENSITIVITY)
Troponin I (High Sensitivity): 73 ng/L — ABNORMAL HIGH (ref <18)
Troponin I (High Sensitivity): 69 ng/L — ABNORMAL HIGH

## 2022-05-17 LAB — URINALYSIS, W/ REFLEX TO CULTURE (INFECTION SUSPECTED)
Bacteria, UA: NONE SEEN
Bilirubin Urine: NEGATIVE
Glucose, UA: NEGATIVE mg/dL
Hgb urine dipstick: NEGATIVE
Ketones, ur: NEGATIVE mg/dL
Leukocytes,Ua: NEGATIVE
Nitrite: NEGATIVE
Protein, ur: NEGATIVE mg/dL
Specific Gravity, Urine: 1.016 (ref 1.005–1.030)
pH: 5 (ref 5.0–8.0)

## 2022-05-17 LAB — CBC WITH DIFFERENTIAL/PLATELET
Abs Immature Granulocytes: 0.03 K/uL (ref 0.00–0.07)
Basophils Absolute: 0 K/uL (ref 0.0–0.1)
Basophils Relative: 1 %
Eosinophils Absolute: 0 K/uL (ref 0.0–0.5)
Eosinophils Relative: 1 %
HCT: 35.2 % — ABNORMAL LOW (ref 39.0–52.0)
Hemoglobin: 11.8 g/dL — ABNORMAL LOW (ref 13.0–17.0)
Immature Granulocytes: 1 %
Lymphocytes Relative: 22 %
Lymphs Abs: 0.7 K/uL (ref 0.7–4.0)
MCH: 29.3 pg (ref 26.0–34.0)
MCHC: 33.5 g/dL (ref 30.0–36.0)
MCV: 87.3 fL (ref 80.0–100.0)
Monocytes Absolute: 0.4 K/uL (ref 0.1–1.0)
Monocytes Relative: 14 %
Neutro Abs: 1.9 K/uL (ref 1.7–7.7)
Neutrophils Relative %: 61 %
Platelets: 146 K/uL — ABNORMAL LOW (ref 150–400)
RBC: 4.03 MIL/uL — ABNORMAL LOW (ref 4.22–5.81)
RDW: 14.6 % (ref 11.5–15.5)
WBC: 3.1 K/uL — ABNORMAL LOW (ref 4.0–10.5)
nRBC: 0 % (ref 0.0–0.2)

## 2022-05-17 LAB — RAPID URINE DRUG SCREEN, HOSP PERFORMED
Amphetamines: NOT DETECTED
Barbiturates: NOT DETECTED
Benzodiazepines: POSITIVE — AB
Cocaine: NOT DETECTED
Opiates: NOT DETECTED
Tetrahydrocannabinol: POSITIVE — AB

## 2022-05-17 LAB — COMPREHENSIVE METABOLIC PANEL WITH GFR
ALT: 17 U/L (ref 0–44)
AST: 29 U/L (ref 15–41)
Albumin: 2.8 g/dL — ABNORMAL LOW (ref 3.5–5.0)
Alkaline Phosphatase: 47 U/L (ref 38–126)
Anion gap: 13 (ref 5–15)
BUN: 17 mg/dL (ref 8–23)
CO2: 19 mmol/L — ABNORMAL LOW (ref 22–32)
Calcium: 8.1 mg/dL — ABNORMAL LOW (ref 8.9–10.3)
Chloride: 100 mmol/L (ref 98–111)
Creatinine, Ser: 1.16 mg/dL (ref 0.61–1.24)
GFR, Estimated: >60 mL/min
Glucose, Bld: 77 mg/dL (ref 70–99)
Potassium: 3.8 mmol/L (ref 3.5–5.1)
Sodium: 132 mmol/L — ABNORMAL LOW (ref 135–145)
Total Bilirubin: 0.6 mg/dL (ref 0.3–1.2)
Total Protein: 5.2 g/dL — ABNORMAL LOW (ref 6.5–8.1)

## 2022-05-17 LAB — TSH: TSH: 0.449 u[IU]/mL (ref 0.350–4.500)

## 2022-05-17 LAB — ETHANOL: Alcohol, Ethyl (B): <10 mg/dL (ref <10)

## 2022-05-17 MED ORDER — PREDNISONE 5 MG PO TABS
5.0000 mg | ORAL_TABLET | Freq: Every day | ORAL | Status: DC
  Administered 2022-05-17: 5 mg via ORAL
  Filled 2022-05-17: qty 1

## 2022-05-17 MED ORDER — LEVOTHYROXINE SODIUM 88 MCG PO TABS
88.0000 ug | ORAL_TABLET | Freq: Every day | ORAL | Status: DC
  Administered 2022-05-17: 88 ug via ORAL
  Filled 2022-05-17: qty 1

## 2022-05-17 MED ORDER — OXYCODONE HCL 5 MG PO TABS: 5.0000 mg | ORAL_TABLET | Freq: Four times a day (QID) | ORAL | 0 refills | Status: DC | PRN

## 2022-05-17 MED ORDER — GADOBUTROL 1 MMOL/ML IV SOLN
8.0000 mL | Freq: Once | INTRAVENOUS | Status: AC | PRN
  Administered 2022-05-17: 8 mL via INTRAVENOUS

## 2022-05-17 NOTE — Discharge Instructions (Addendum)
We evaluated you for your headache.  Your MRI was stable.  We did not see any new brain tumors or bleeding.  Your headache is most likely due to your chronic intracranial process, and may be related also to discontinuation of oxycodone.  I have prescribed you a small amount of oxycodone to take for your headache in the short-term, please follow-up closely with your primary doctor and your [facility] physicians for further management.  We incidentally found that your troponin (cardiac enzymes) was elevated.  You did not have any typical symptoms of a heart attack such as chest pain, difficulty breathing, vomiting, sweating, or fainting.  We discussed staying in the hospital for further testing but he preferred to go home.  I have placed a referral for cardiology so that you can follow-up with them about this abnormal laboratory test.    Please immediately return to the hospital if you develop any chest pain, shortness of breath, sweating, nausea or vomiting, fainting, or other concerning symptoms.  Please also return if you develop any fevers or chills, confusion, increased weakness, or any other concerning symptoms.

## 2022-05-17 NOTE — ED Provider Notes (Signed)
   ED Course / MDM   Clinical Course as of 05/17/22 1242  Sun May 17, 2022  5885 Received sign out from Dr. Jacqulyn Bath. History of brain mets. Presenting with headaches over past week with generalized weakness. Incidentally elevated troponin without chest pain. Pending MRI [WS]  1204 MRI without evidence of acute change.  Has chronic temporal lobe lesion with mild edema which per radiology seems unchanged from outside radiology reports.  Patient does report ongoing headaches.  He reports his headaches actually started once he discontinued oxycodone.  He has been trying to wean himself off of it.  Discussed that he may have some headaches due to his intracranial process and needs some medication for this.  He reports that Tylenol is not sufficient.  Advise he should follow-up with his primary physician and [facility] oncologist to discuss further pain management.  Very low concern for other process such as intracranial infection.  Notably, patient's troponin elevated to 73 down to 69.  Patient denies any chest pain.  His EKG is stable.  He has isolated T wave inversion in 3.  He also denies other anginal symptoms like shortness of breath, diaphoresis, nausea and vomiting.  Discussed that without typical symptoms, stable troponin lower concern for ACS, but unable to fully exclude.  Discussed admission for further workup of troponin elevation such as stress testing, patient wants to go home and does not want to be admitted to the hospital.  Since he does not have any cardiac symptoms at this time, will discharge, placed ambulatory referral to cardiology for troponin elevation, advise strict return precautions should he have any chest pain, shortness of breath, diaphoresis,, nausea or vomiting or other possible anginal symptoms. Will discharge patient to home. All questions answered. Patient comfortable with plan of discharge. Return precautions discussed with patient and specified on the after visit summary.  [WS]     Clinical Course User Index [WS] Lonell Grandchild, MD   Medical Decision Making Amount and/or Complexity of Data Reviewed Labs: ordered. Radiology: ordered.  Risk Prescription drug management.          Lonell Grandchild, MD 05/17/22 1242

## 2022-05-19 ENCOUNTER — Telehealth: Payer: Self-pay | Admitting: Cardiovascular Disease

## 2022-05-19 NOTE — Telephone Encounter (Signed)
Patients daughter called to report patient was seen and treated in ED on 05/16/22 for SOB and headache. He continues to have SOB and states it is getting worse. Denies chest pain, palpitations, N/V and cough.   Scheduled for follow up with APP on 05/20/22. Advised to go to the ED or call 911 for worsening symptoms, chest pain, SOB, N/V and/or palpitations.  Patient's daughter verbalized understanding and had no questions.

## 2022-05-19 NOTE — Telephone Encounter (Signed)
Pt c/o Shortness Of Breath: STAT if SOB developed within the last 24 hours or pt is noticeably SOB on the phone  1. Are you currently SOB (can you hear that pt is SOB on the phone)? Yes; per daughter; was in ER  2. How long have you been experiencing SOB? Saturday Night  3. Are you SOB when sitting or when up moving around? Both   4. Are you currently experiencing any other symptoms? Very tired; headaches; unsteady on feet

## 2022-05-19 NOTE — Progress Notes (Unsigned)
Cardiology Office Note:    Date:  05/20/2022   ID:  Dorna Leitz, DOB [birth year], MRN [id number]  PCP:  Pincus Sanes, MD  [facility] Providers Cardiologist:  Verne Carrow, MD     Referring MD: Pincus Sanes, MD   Chief Complaint:  Shortness of Breath     History of Present Illness:   Maxwell Fox is a 77 y.o. male with   history of sinus bradycardia, mild AS, BPH, HTN, HLD, iron deficiency anemia, melanoma with brain mets, obesity s/p gastric bypass. He had an episode of dizziness in July 2020 and his heart rate was in the 30s. He was seen in the ED and had PVCs with bigeminy. He had an echo and a stress test in Dr. Annitta Jersey office. Echo 08/30/18: LVEF=50%. Grade 1 diastolic dysfunction. Mild LVH. Mild MR. Mild AS. Nuclear stress test 08/24/18:No ischemia. He has metastatic melanoma and has had treatment at [facility] and is in remission. He had a tumor removal and radiation. He has adrenal insufficiency post immunotherapy and is on daily Prednisone.  He was admitted to [facility] February 2022 with upper GI bleeding due to AVMs.    Patient was in ED 05/16/22  with HA generalized weakness and MRI chronic temporal lobe lesion with mild edema. Was trying to wean himself off oxycodone. Troponins 73, 69, TWI 3. Wanted to go home. Daughter called in saying he's had worsening DOE. He can only take 10 steps and he's out of breath. SOB with deep breath or talking.  Had to come in a wheelchair-so weak. Never used cane or walker before. Also having diarrhea but improving. Wife at home with flu like symptoms. Denies chest pain or tightness. No leg swelling, palpitations. WBC 3.1 in ED.       Past Medical History:  Diagnosis Date   B12 nutritional deficiency    malabsorbtion   Blood transfusion without reported diagnosis 2022   BPH (benign prostatic hypertrophy)    Brain cancer 2017   removed "golf ball sized mass"   Depression    on meds   Diverticulosis    GERD (gastroesophageal  reflux disease)    on meds   GI bleeding 02/2020   Hypertension    Iron deficiency anemia    malabsorption related (s/p gastric bypass)   Melanoma    Obesity    s/p gastric bypass 2000, start 355#   Pancreatic cyst    Pulmonary nodules    Thyroid disease    on meds   Current Medications: Current Meds  Medication Sig   acetaminophen (TYLENOL) 325 MG tablet Take 2 tablets (650 mg total) by mouth every 6 (six) hours as needed for moderate pain.   calcium carbonate (OS-CAL - DOSED IN MG OF ELEMENTAL CALCIUM) 1250 (500 Ca) MG tablet Take 1 tablet by mouth daily with supper.   Cholecalciferol (VITAMIN D) 2000 UNITS tablet Take 1 tablet (2,000 Units total) by mouth daily.   clonazePAM (KLONOPIN) 1 MG tablet TAKE 1 TABLET BY MOUTH TWICE DAILY AS NEEDED FOR ANXIETY   DULoxetine (CYMBALTA) 30 MG capsule Take 1 capsule (30 mg total) by mouth daily.   finasteride (PROSCAR) 5 MG tablet Take 1 tablet (5 mg total) by mouth daily.   levothyroxine (SYNTHROID) 88 MCG tablet TAKE 1 TABLET(88 MCG) BY MOUTH DAILY BEFORE AND BREAKFAST   Multiple Vitamin (MULTIVITAMIN WITH MINERALS) TABS tablet Take 1 tablet by mouth daily.   omeprazole (PRILOSEC) 40 MG capsule Take 1 capsule (40 mg total)  by mouth daily.   ondansetron (ZOFRAN) 4 MG tablet TAKE 1 TABLET(4 MG) BY MOUTH EVERY 8 HOURS AS NEEDED FOR NAUSEA OR VOMITING   oxyCODONE (ROXICODONE) 5 MG immediate release tablet Take 1 tablet (5 mg total) by mouth every 6 (six) hours as needed for severe pain.   predniSONE (DELTASONE) 5 MG tablet TAKE 1 TABLET BY MOUTH EVERY DAY WITH BREAKFAST   sucralfate (CARAFATE) 1 GM/10ML suspension Take 1 g by mouth 4 (four) times daily -  with meals and at bedtime.   tamsulosin (FLOMAX) 0.4 MG CAPS capsule TAKE 1 CAPSULE BY MOUTH EVERY DAY AFTER SUPPER    Allergies:   Patient has no known allergies.   Social History   Tobacco Use   Smoking status: Former    Types: Cigarettes    Quit date: 02/03/1963    Years since  quitting: 59.3   Smokeless tobacco: Never  Vaping Use   Vaping Use: Never used  Substance Use Topics   Alcohol use: Not Currently    Comment: wine 2x/week   Drug use: Never    Family Hx: The patient's family history includes Breast cancer in his sister; Hypertension in his father; Macular degeneration in his mother; Melanoma in his brother, mother, and sister; Stomach cancer (age of onset: 96) in his sister; Uterine cancer in his sister. There is no history of Colon polyps, Colon cancer, Esophageal cancer, or Rectal cancer.  ROS     Physical Exam:    VS:  BP 111/72 (BP Location: Left Arm, Patient Position: Sitting, Cuff Size: Normal)   Pulse 72   Ht  (1.803 m)   Wt 170 lb (77.1 kg)   BMI 23.71 kg/m     Wt Readings from Last 3 Encounters:  05/20/22 170 lb (77.1 kg)  05/17/22 173 lb 15.1 oz (78.9 kg)  04/20/22 174 lb (78.9 kg)    Physical Exam  GEN: Thin, elderly, doesn't feel well,  in no acute distress  Neck: no JVD, carotid bruits, or masses Cardiac:RRR; 2/6 systolic murmur LSB Respiratory:  clear to auscultation bilaterally, normal work of breathing GI: soft, nontender, nondistended, + BS Ext: without cyanosis, clubbing, or edema, Good distal pulses bilaterally Neuro:  Alert and Oriented x 3,  Psych: euthymic mood, full affect        EKGs/Labs/Other Test Reviewed:    EKG:  EKG is not ordered today.  The ekg reviewed from ed  Recent Labs: 05/16/2022: ALT 17; BUN 17; Creatinine, Ser 1.16; Hemoglobin 11.8; Platelets 146; Potassium 3.8; Sodium 132; TSH 0.449   Recent Lipid Panel No results for input(s): "CHOL", "TRIG", "HDL", "VLDL", "LDLCALC", "LDLDIRECT" in the last 8760 hours.   Prior CV Studies:   NST 2020 IMPRESSION: 1. Old infarct within the inferior wall and septum. No evidence of ischemia.   2. Hypokinesia in the inferoseptal wall.   3. Left ventricular ejection fraction 44%   4. Non invasive risk stratification*: Intermediate   *2012  Appropriate Use Criteria for Coronary Revascularization Focused Update: J Am Coll Cardiol. 2012;59(9):857-881. http://[email or web address]?articleid=[email or web address]    Risk Assessment/Calculations/Metrics:              ASSESSMENT & PLAN:   No problem-specific Assessment & Plan notes found for this encounter.   DOE and SOB at rest with talking. -mild AS on echo 2020, recent ED visit and elevated troponins 73,69, denies chest pain. With history of melanoma with mets need to rule out PE. Will also check echo and lexiscan, repeat labs today.  Aortic stenosis mild on echo 2020 was supposed to have repeat 2023-order today  Diarrhea with dehydration-encourage hydration and increase food intake as tolerated. Wife also has viral type symptoms at home. With low WBC recommend f/u at [facility]  Melanoma with brain mets  Obesity s/p gastric bypass        Shared Decision Making/Informed Consent The risks [chest pain, shortness of breath, cardiac arrhythmias, dizziness, blood pressure fluctuations, myocardial infarction, stroke/transient ischemic attack, nausea, vomiting, allergic reaction, radiation exposure, metallic taste sensation and life-threatening complications (estimated to be 1 in 10,000)], benefits (risk stratification, diagnosing coronary artery disease, treatment guidance) and alternatives of a nuclear stress test were discussed in detail with Maxwell Fox and he agrees to proceed.   Dispo:  No follow-ups on file.   Medication Adjustments/Labs and Tests Ordered: Current medicines are reviewed at length with the patient today.  Concerns regarding medicines are outlined above.  Tests Ordered: Orders Placed This Encounter  Procedures   CT Angio Chest Pulmonary Embolism (PE) W or WO Contrast   CBC   Basic metabolic panel   MYOCARDIAL PERFUSION IMAGING   ECHOCARDIOGRAM COMPLETE   Medication Changes: No orders of the defined types were placed in this encounter.  Signed, Jacolyn Reedy, PA-C  05/20/2022 1:58 PM    [facility] [address] [place], [place], [place]  [address] Phone: [phone]; Fax: [phone]

## 2022-05-20 ENCOUNTER — Encounter: Payer: Self-pay | Admitting: Internal Medicine

## 2022-05-20 ENCOUNTER — Ambulatory Visit (INDEPENDENT_AMBULATORY_CARE_PROVIDER_SITE_OTHER): Payer: Medicare Other | Admitting: Physician Assistant

## 2022-05-20 ENCOUNTER — Telehealth: Payer: Self-pay

## 2022-05-20 ENCOUNTER — Ambulatory Visit (HOSPITAL_COMMUNITY)
Admission: RE | Admit: 2022-05-20 | Discharge: 2022-05-20 | Disposition: A | Discharge status: Home / Self Care | Payer: Medicare Other | Source: Ambulatory Visit | Attending: Physician Assistant | Admitting: Physician Assistant

## 2022-05-20 ENCOUNTER — Encounter: Payer: Self-pay | Admitting: Physician Assistant

## 2022-05-20 ENCOUNTER — Telehealth: Payer: Self-pay | Admitting: Internal Medicine

## 2022-05-20 VITALS — BP 111/72 | HR 72 | Ht 71.0 in | Wt 170.0 lb

## 2022-05-20 DIAGNOSIS — R0602 Shortness of breath: Secondary | ICD-10-CM

## 2022-05-20 DIAGNOSIS — R197 Diarrhea, unspecified: Secondary | ICD-10-CM

## 2022-05-20 DIAGNOSIS — Z9884 Bariatric surgery status: Secondary | ICD-10-CM

## 2022-05-20 DIAGNOSIS — C779 Secondary and unspecified malignant neoplasm of lymph node, unspecified: Secondary | ICD-10-CM | POA: Insufficient documentation

## 2022-05-20 DIAGNOSIS — R7989 Other specified abnormal findings of blood chemistry: Secondary | ICD-10-CM

## 2022-05-20 MED ORDER — IOHEXOL 350 MG/ML SOLN
60.0000 mL | Freq: Once | INTRAVENOUS | Status: AC | PRN
  Administered 2022-05-20: 60 mL via INTRAVENOUS

## 2022-05-20 MED ORDER — OXYCODONE HCL 5 MG PO TABS: 5.0000 mg | ORAL_TABLET | Freq: Four times a day (QID) | ORAL | 0 refills | Status: DC | PRN

## 2022-05-20 NOTE — Telephone Encounter (Signed)
Pt ready for scheduling on or after 06/13/22  Out-of-pocket cost due at time of visit: $0  Primary: Medicare [place] co-insurance: 20% (approximately $302) Admin fee co-insurance: 20% (approximately $25)  Deductible:   Secondary: AARP Medicare Supp Plan F Prol[place] co-insurance: Covers Medicare Part B co-insurance Admin fee co-insurance: Covers Medicare Part B co-insurance  Deductible:  Covered by secondary  Prior Auth: NOT required PA# Valid:   ** This summary of benefits is an estimation of the patient's out-of-pocket cost. Exact cost may vary based on individual plan coverage.

## 2022-05-20 NOTE — Telephone Encounter (Signed)
Pharmacy called saying pt was trying to get his prescription early and also seen the dosage has change please give pharmacy a call back with updateb

## 2022-05-20 NOTE — Patient Instructions (Signed)
Medication Instructions:  Your physician recommends that you continue on your current medications as directed. Please refer to the Current Medication list given to you today.  *If you need a refill on your cardiac medications before your next appointment, please call your pharmacy*   Lab Work: Cbc, Bmp - today   If you have labs (blood work) drawn today and your tests are completely normal, you will receive your results only by: MyChart Message (if you have MyChart) OR A paper copy in the mail If you have any lab test that is abnormal or we need to change your treatment, we will call you to review the results.   Testing/Procedures: Your physician has requested that you have a lexiscan myoview. For further information please visit [email or web address] Please follow instruction sheet, as given.  Your physician has requested that you have an echocardiogram. Echocardiography is a painless test that uses sound waves to create images of your heart. It provides your doctor with information about the size and shape of your heart and how well your heart's chambers and valves are working. This procedure takes approximately one hour. There are no restrictions for this procedure. Please do NOT wear cologne, perfume, aftershave, or lotions (deodorant is allowed). Please arrive 15 minutes prior to your appointment time.  Your physician has ordered you you to have a *STAT* chest ct to rule out pulmonary embolism    Follow-Up: At [facility], you and your health needs are our priority.  As part of our continuing mission to provide you with exceptional heart care, we have created designated Provider Care Teams.  These Care Teams include your primary Cardiologist (physician) and Advanced Practice Providers (APPs -  Physician Assistants and Nurse Practitioners) who all work together to provide you with the care you need, when you need it.  We recommend signing up for the patient portal called  "MyChart".  Sign up information is provided on this After Visit Summary.  MyChart is used to connect with patients for Virtual Visits (Telemedicine).  Patients are able to view lab/test results, encounter notes, upcoming appointments, etc.  Non-urgent messages can be sent to your provider as well.   To learn more about what you can do with MyChart, go to [email or web address].    Your next appointment:   4 month(s)  Provider:   Verne Carrow, MD     Other Instructions

## 2022-05-20 NOTE — Telephone Encounter (Signed)
[id number] - [place], [place] - [address] AT [address] Phone:  [phone]  Fax:  [phone]      

## 2022-05-20 NOTE — Telephone Encounter (Signed)
        Patient  visited  on 4/14   Telephone encounter attempt :  1st  A HIPAA compliant voice message was left requesting a return call.  Instructed patient to call back   Maxwell Fox [facility] Guide, [place] [phone] [address], [place], [place] [address] Phone: [phone] Email: [email or web address]       

## 2022-05-21 ENCOUNTER — Encounter: Payer: Self-pay | Admitting: Family Medicine

## 2022-05-21 ENCOUNTER — Telehealth (INDEPENDENT_AMBULATORY_CARE_PROVIDER_SITE_OTHER): Payer: Medicare Other | Admitting: Family Medicine

## 2022-05-21 ENCOUNTER — Ambulatory Visit: Payer: Medicare Other | Admitting: Internal Medicine

## 2022-05-21 ENCOUNTER — Telehealth: Payer: Self-pay | Admitting: Internal Medicine

## 2022-05-21 VITALS — BP 122/74 | HR 74 | Ht 72.0 in | Wt 170.0 lb

## 2022-05-21 DIAGNOSIS — U071 COVID-19: Secondary | ICD-10-CM

## 2022-05-21 LAB — BASIC METABOLIC PANEL
BUN/Creatinine Ratio: 12 (ref 10–24)
BUN: 13 mg/dL (ref 8–27)
CO2: 18 mmol/L — ABNORMAL LOW (ref 20–29)
Calcium: 8 mg/dL — ABNORMAL LOW (ref 8.6–10.2)
Chloride: 106 mmol/L (ref 96–106)
Creatinine, Ser: 1.05 mg/dL (ref 0.76–1.27)
Glucose: 85 mg/dL (ref 70–99)
Potassium: 5.2 mmol/L (ref 3.5–5.2)
Sodium: 138 mmol/L (ref 134–144)
eGFR: 73 mL/min/{1.73_m2} (ref >59)

## 2022-05-21 LAB — CBC
Hematocrit: 42.5 % (ref 37.5–51.0)
Hemoglobin: 13.6 g/dL (ref 13.0–17.7)
MCH: 27.6 pg (ref 26.6–33.0)
MCHC: 32 g/dL (ref 31.5–35.7)
MCV: 86 fL (ref 79–97)
Platelets: 179 10*3/uL (ref 150–450)
RBC: 4.92 x10E6/uL (ref 4.14–5.80)
RDW: 13.9 % (ref 11.6–15.4)
WBC: 2.9 10*3/uL — ABNORMAL LOW (ref 3.4–10.8)

## 2022-05-21 MED ORDER — OXYCODONE HCL 5 MG PO TABS: 5.0000 mg | ORAL_TABLET | ORAL | 0 refills | Status: DC | PRN

## 2022-05-21 MED ORDER — MOLNUPIRAVIR EUA 200MG CAPSULE: 4.0000 | ORAL_CAPSULE | Freq: Two times a day (BID) | ORAL | 0 refills | Status: AC

## 2022-05-21 NOTE — Patient Instructions (Addendum)
HOME CARE TIPS:    -I sent the medication(s) we discussed to your pharmacy: Meds ordered this encounter  Medications   molnupiravir EUA (LAGEVRIO) 200 mg CAPS capsule    Sig: Take 4 capsules (800 mg total) by mouth 2 (two) times daily for 5 days.    Dispense:  40 capsule    Refill:  0     -I sent in the Covid19 treatment or referral you requested per our discussion. Please see the information provided below and discuss further with the pharmacist/treatment team.    -there is a chance of rebound illness with covid after improving. This can happen whether or not you take an antiviral treatment. If you become sick again with covid after getting better, please schedule a follow up virtual visit and isolate again.   -nasal saline sinus rinses twice daily  -stay hydrated, drink plenty of fluids and eat small healthy meals - avoid dairy  -can take 1000 IU ( ) Vit D3 and 100-500 mg of Vit C daily per instructions  -follow up with your doctor in 2-3 days unless improving and feeling better  -stay home while sick, except to seek medical care. If you have COVID19, you will likely be contagious for 7-10 days. Flu or Influenza is likely contagious for about 7 days. Other respiratory viral infections remain contagious for 5-10+ days depending on the virus and many other factors. Wear a good mask that fits snugly (such as N95 or KN95) if around others to reduce the risk of transmission.  It was nice to meet you today, and I really hope you are feeling better soon. I help [facility] out with telemedicine visits on Tuesdays and Thursdays and am happy to help if you need a follow up virtual visit on those days. Otherwise, if you have any concerns or questions following this visit please schedule a follow up visit with your Primary Care doctor or seek care at a local urgent care clinic to avoid delays in care.    Seek in person care or schedule a follow up video visit promptly if your symptoms  worsen, new concerns arise or you are not improving with treatment. Call 911 and/or seek emergency care if your symptoms are severe or life threatening.  Molnupiravir Capsules What is this medication? MOLNUPIRAVIR (MOL nue PIR a vir) treats mild to moderate COVID-19. It may help people who are at high risk of developing severe illness. This medication works by limiting the spread of the virus in your body. The FDA has allowed the emergency use of this medication. This medicine may be used for other purposes; ask your health care provider or pharmacist if you have questions. COMMON BRAND NAME(S): LAGEVRIO What should I tell my care team before I take this medication? They need to know if you have any of these conditions: Any allergies Any serious illness An unusual or allergic reaction to molnupiravir, other medications, foods, dyes, or preservatives Pregnant or trying to get pregnant Breast-feeding How should I use this medication? Take this medication by mouth with water. Take it as directed on the prescription label at the same time every day. Do not cut, crush, or chew this medication. Swallow the capsules whole. You can take it with or without food. If it upsets your stomach, take it with food. Take all of it unless your care team tells you to stop it early. Keep taking it even if you think you are better. Talk to your care team about the use of this  medication in children. Special care may be needed. Overdosage: If you think you have taken too much of this medicine contact a poison control center or emergency room at once. NOTE: This medicine is only for you. Do not share this medicine with others. What if I miss a dose? If you miss a dose, take it as soon as you can unless it is more than 10 hours late. If it is more than 10 hours late, skip the missed dose. Take the next dose at the normal time. Do not take extra or 2 doses at the same time to make up for the missed dose. What may  interact with this medication? Interactions have not been studied. This list may not describe all possible interactions. Give your health care provider a list of all the medicines, herbs, non-prescription drugs, or dietary supplements you use. Also tell them if you smoke, drink alcohol, or use illegal drugs. Some items may interact with your medicine. What should I watch for while using this medication? Your condition will be monitored carefully while you are receiving this medication. Visit your care team for regular checkups. Tell your care team if your symptoms do not start to get better or if they get worse. Do not become pregnant while taking this medication. You may need a pregnancy test before starting this medication. Women must use a reliable form of birth control while taking this medication and for 4 days after stopping the medication. Women should inform their care team if they wish to become pregnant or think they might be pregnant. Men should not father a child while taking this medication and for 3 months after stopping it. There is potential for serious harm to an unborn child. Talk to your care team for more information. Do not breast-feed an infant while taking this medication and for 4 days after stopping the medication. What side effects may I notice from receiving this medication? Side effects that you should report to your care team as soon as possible: Allergic reactions--skin rash, itching, hives, swelling of the face, lips, tongue, or throat Side effects that usually do not require medical attention (report these to your care team if they continue or are bothersome): Diarrhea Dizziness Nausea This list may not describe all possible side effects. Call your doctor for medical advice about side effects. You may report side effects to FDA at 1-800-FDA-1088. Where should I keep my medication? Keep out of the reach of children and pets. Store at room temperature between 20 and 25  degrees C (68 and 77 degrees F). Get rid of any unused medication after the expiration date. To get rid of medications that are no longer needed or have expired: Take the medication to a medication take-back program. Check with your pharmacy or law enforcement to find a location. If you cannot return the medication, check the label or package insert to see if the medication should be thrown out in the garbage or flushed down the toilet. If you are not sure, ask your care team. If it is safe to put it in the trash, take the medication out of the container. Mix the medication with cat litter, dirt, coffee grounds, or other unwanted substance. Seal the mixture in a bag or container. Put it in the trash. NOTE: This sheet is a summary. It may not cover all possible information. If you have questions about this medicine, talk to your doctor, pharmacist, or health care provider.  2023 Elsevier/Gold Standard (2020-01-29 00:00:00)

## 2022-05-21 NOTE — Progress Notes (Signed)
Virtual Visit via Video Note  I connected with Maxwell Fox  on 05/21/22 at  5:20 PM EDT by a video enabled telemedicine application and verified that I am speaking with the correct person using two identifiers.  Location patient: [place] Location provider:work or home office Persons participating in the virtual visit: patient, provider  I discussed the limitations and requested verbal permission for telemedicine visit. The patient expressed understanding and agreed to proceed.   HPI:  Acute telemedicine visit for Covid: -Onset: about 5 days ago, tested positive  -Symptoms include: sore throat, feels tired, cough, occ mild SOB at times, some diarrhea -now improving a little -was examine in ER and by cardiologist - but nobody tested him for covid - he happened to check today and couldn't believe it -Denies: wheezing, CP, fever, NV -Has tried: pepto bismol -Pertinent past medical history: see below -Pertinent medication allergies:No Known Allergies -COVID-19 vaccine status: had all the doses except for the last dose in the fall - reports has had over 5 boosters  Immunization History  Administered Date(s) Administered   Fluad Quad(high Dose 65+) 11/12/2018, 10/08/2020, 11/18/2021   Influenza, High Dose Seasonal PF 11/08/2017   Influenza,inj,Quad PF,6+ Mos 10/26/2013, 11/01/2014   Influenza-Unspecified 10/26/2013, 11/01/2014, 01/02/2017   PFIZER Comirnaty(Gray Top)Covid-19 Tri-Sucrose Vaccine 03/31/2019, 04/28/2019, 11/13/2019, 05/13/2020   PFIZER(Purple Top)SARS-COV-2 Vaccination 03/31/2019, 04/28/2019, 11/13/2019, 05/13/2020   Pfizer Covid-19 Vaccine Bivalent Booster 40yrs & up 12/03/2020   Pneumococcal Conjugate-13 01/10/2018   Pneumococcal Polysaccharide-23 10/26/2013     ROS: See pertinent positives and negatives per HPI.  Past Medical History:  Diagnosis Date   B12 nutritional deficiency    malabsorbtion   Blood transfusion without reported diagnosis 2022   BPH (benign prostatic  hypertrophy)    Brain cancer 2017   removed "golf ball sized mass"   Depression    on meds   Diverticulosis    GERD (gastroesophageal reflux disease)    on meds   GI bleeding 02/2020   Hypertension    Iron deficiency anemia    malabsorption related (s/p gastric bypass)   Melanoma    Obesity    s/p gastric bypass 2000, start 355#   Pancreatic cyst    Pulmonary nodules    Thyroid disease    on meds    Past Surgical History:  Procedure Laterality Date   CHOLECYSTECTOMY  2000   COLONOSCOPY WITH PROPOFOL N/A 03/05/2020   Procedure: COLONOSCOPY WITH PROPOFOL;  Surgeon: Lemar Lofty., MD;  Location: [facility] ENDOSCOPY;  Service: Gastroenterology;  Laterality: N/A;   CRANIOTOMY FOR TUMOR  12/11/2015   metastatic melanoma   ENTEROSCOPY N/A 03/30/2020   Procedure: ENTEROSCOPY;  Surgeon: Hilarie Fredrickson, MD;  Location: [facility] ENDOSCOPY;  Service: Endoscopy;  Laterality: N/A;   ESOPHAGOGASTRODUODENOSCOPY N/A 03/30/2020   Procedure: ESOPHAGOGASTRODUODENOSCOPY (EGD);  Surgeon: Hilarie Fredrickson, MD;  Location: [facility] ENDOSCOPY;  Service: Endoscopy;  Laterality: N/A;   ESOPHAGOGASTRODUODENOSCOPY (EGD) WITH PROPOFOL N/A 02/14/2020   Procedure: ESOPHAGOGASTRODUODENOSCOPY (EGD) WITH PROPOFOL;  Surgeon: Shellia Cleverly, DO;  Location: [facility] ENDOSCOPY;  Service: Endoscopy;  Laterality: N/A;   ESOPHAGOGASTRODUODENOSCOPY (EGD) WITH PROPOFOL N/A 02/26/2020   Procedure: ESOPHAGOGASTRODUODENOSCOPY (EGD) WITH PROPOFOL;  Surgeon: Hilarie Fredrickson, MD;  Location: [facility] ENDOSCOPY;  Service: Endoscopy;  Laterality: N/A;   ESOPHAGOGASTRODUODENOSCOPY (EGD) WITH PROPOFOL N/A 03/03/2020   Procedure: ESOPHAGOGASTRODUODENOSCOPY (EGD) WITH PROPOFOL;  Surgeon: Jeani Hawking, MD;  Location: [facility] ENDOSCOPY;  Service: Endoscopy;  Laterality: N/A;   ESOPHAGOGASTRODUODENOSCOPY (EGD) WITH PROPOFOL N/A 03/28/2020   Procedure: ESOPHAGOGASTRODUODENOSCOPY (EGD)  WITH PROPOFOL;  Surgeon: Hilarie Fredrickson, MD;  Location: [facility] ENDOSCOPY;  Service: Endoscopy;   Laterality: N/A;   FOREIGN BODY REMOVAL  03/30/2020   Procedure: FOREIGN BODY REMOVAL;  Surgeon: Hilarie Fredrickson, MD;  Location: [facility] ENDOSCOPY;  Service: Endoscopy;;   GIVENS CAPSULE STUDY N/A 03/28/2020   Procedure: GIVENS CAPSULE STUDY;  Surgeon: Hilarie Fredrickson, MD;  Location: [facility] ENDOSCOPY;  Service: Endoscopy;  Laterality: N/A;   HEMOSTASIS CONTROL  02/26/2020   Procedure: HEMOSTASIS CONTROL;  Surgeon: Hilarie Fredrickson, MD;  Location: [facility] ENDOSCOPY;  Service: Endoscopy;;   HEMOSTASIS CONTROL  03/30/2020   Procedure: HEMOSTASIS CONTROL;  Surgeon: Hilarie Fredrickson, MD;  Location: [facility] ENDOSCOPY;  Service: Endoscopy;;   HOT HEMOSTASIS N/A 02/26/2020   Procedure: HOT HEMOSTASIS (ARGON PLASMA COAGULATION/BICAP);  Surgeon: Hilarie Fredrickson, MD;  Location: [facility] ENDOSCOPY;  Service: Endoscopy;  Laterality: N/A;   MOHS SURGERY  2012   melanoma, mid back, [place]   ROUX-EN-Y PROCEDURE  2000   TOTAL SHOULDER REPLACEMENT Right 2011   [place]   WISDOM TOOTH EXTRACTION       Current Outpatient Medications:    acetaminophen (TYLENOL) 325 MG tablet, Take 2 tablets (650 mg total) by mouth every 6 (six) hours as needed for moderate pain., Disp: , Rfl:    calcium carbonate (OS-CAL - DOSED IN MG OF ELEMENTAL CALCIUM) 1250 (500 Ca) MG tablet, Take 1 tablet by mouth daily with supper., Disp: , Rfl:    Cholecalciferol (VITAMIN D) 2000 UNITS tablet, Take 1 tablet (2,000 Units total) by mouth daily., Disp: 30 tablet, Rfl: 11   clonazePAM (KLONOPIN) 1 MG tablet, TAKE 1 TABLET BY MOUTH TWICE DAILY AS NEEDED FOR ANXIETY, Disp: 60 tablet, Rfl: 2   DULoxetine (CYMBALTA) 30 MG capsule, Take 1 capsule (30 mg total) by mouth daily., Disp: 90 capsule, Rfl: 1   finasteride (PROSCAR) 5 MG tablet, Take 1 tablet (5 mg total) by mouth daily., Disp: 90 tablet, Rfl: 1   levothyroxine (SYNTHROID) 88 MCG tablet, TAKE 1 TABLET(88 MCG) BY MOUTH DAILY BEFORE AND BREAKFAST, Disp: 90 tablet, Rfl: 3   molnupiravir EUA (LAGEVRIO) 200 mg CAPS capsule,  Take 4 capsules (800 mg total) by mouth 2 (two) times daily for 5 days., Disp: 40 capsule, Rfl: 0   Multiple Vitamin (MULTIVITAMIN WITH MINERALS) TABS tablet, Take 1 tablet by mouth daily., Disp: , Rfl:    omeprazole (PRILOSEC) 40 MG capsule, Take 1 capsule (40 mg total) by mouth daily., Disp: 90 capsule, Rfl: 3   ondansetron (ZOFRAN) 4 MG tablet, TAKE 1 TABLET(4 MG) BY MOUTH EVERY 8 HOURS AS NEEDED FOR NAUSEA OR VOMITING, Disp: 90 tablet, Rfl: 1   oxyCODONE (ROXICODONE) 5 MG immediate release tablet, Take 1 tablet (5 mg total) by mouth every 5 (five) hours as needed for severe pain (chronic back pain)., Disp: 150 tablet, Rfl: 0   predniSONE (DELTASONE) 5 MG tablet, TAKE 1 TABLET BY MOUTH EVERY DAY WITH BREAKFAST, Disp: 90 tablet, Rfl: 3   tamsulosin (FLOMAX) 0.4 MG CAPS capsule, TAKE 1 CAPSULE BY MOUTH EVERY DAY AFTER SUPPER, Disp: 90 capsule, Rfl: 1   sucralfate (CARAFATE) 1 GM/10ML suspension, Take 1 g by mouth 4 (four) times daily -  with meals and at bedtime. (Patient not taking: Reported on 05/21/2022), Disp: , Rfl:   EXAM:  VITALS per patient if applicable:  GENERAL: alert, oriented, appears well and in no acute distress  HEENT: atraumatic, conjunttiva clear, no obvious abnormalities on inspection of external nose  and ears  NECK: normal movements of the head and neck  LUNGS: on inspection no signs of respiratory distress, breathing rate appears normal, no obvious gross SOB, gasping or wheezing  CV: no obvious cyanosis  MS: moves all visible extremities without noticeable abnormality  PSYCH/NEURO: pleasant and cooperative, no obvious depression or anxiety, speech and thought processing grossly intact  ASSESSMENT AND PLAN:  Discussed the following assessment and plan:  COVID-19   Discussed treatment options, side effect and risk of drug interactions, ideal treatment window, potential complications, isolation and precautions for COVID-19.   Checked for/reviewed last GFR - listed  in HPI if available.  After lengthy discussion, the patient opted for treatment with legevrio due to being higher risk for complications of covid or severe disease and other factors. Discussed  preliminary knowledge of risks/interactions/side effects per EUA document vs possible benefits and precautions. This information was shared with patient during the visit and also was provided in patient instructions. Also, advised that patient discuss risks/interactions and use with pharmacist/treatment team as well.  Other symptomatic care measures summarized in patient instructions. Advised to seek prompt virtual visit or in person care if worsening, new symptoms arise, or if is not improving with treatment as expected per our conversation of expected course. Discussed options for follow up care. Did let this patient know that I do telemedicine on Tuesdays and Thursdays for [place] and those are the days I am logged into the system. Advised to schedule follow up visit with PCP, [place] virtual visits or [facility] if any further questions or concerns to avoid delays in care.   I discussed the assessment and treatment plan with the patient. The patient was provided an opportunity to ask questions and all were answered. The patient agreed with the plan and demonstrated an understanding of the instructions.     Terressa Koyanagi, DO

## 2022-05-21 NOTE — Telephone Encounter (Signed)
Patient is scheduled for the next Prolia injection with office visit with Dr. Elvera Lennox on 06/26/2022.

## 2022-05-21 NOTE — Telephone Encounter (Signed)
Rx sent 

## 2022-05-21 NOTE — Telephone Encounter (Signed)
Pt daughter called about some with her father  ( Pt) medication oxyCodone. Please give daughter a call back

## 2022-05-21 NOTE — Telephone Encounter (Signed)
Spoke with Luisa Hart in pharmacy and matter resolved.   Patient can pick up script tomorrow.

## 2022-05-21 NOTE — Telephone Encounter (Signed)
I left voicemail for patient and my chart sent to him as well.  Daughter is not listed on his DPR so I can not disclose any info to her.

## 2022-05-25 ENCOUNTER — Encounter: Payer: Self-pay | Admitting: Internal Medicine

## 2022-05-25 MED ORDER — LEVOTHYROXINE SODIUM 88 MCG PO TABS: ORAL_TABLET | ORAL | 0 refills | Status: DC

## 2022-06-01 ENCOUNTER — Telehealth: Payer: Self-pay | Admitting: Internal Medicine

## 2022-06-01 MED ORDER — PREDNISONE 5 MG PO TABS: ORAL_TABLET | ORAL | 3 refills | Status: DC

## 2022-06-01 NOTE — Telephone Encounter (Signed)
Rx sent to pharmacy   

## 2022-06-01 NOTE — Telephone Encounter (Signed)
MEDICATION: predniSONE (DELTASONE) 5 MG tablet   PHARMACY:    [id number] - [place], [place] - [address] AT [facility] OF [address] (Ph: [phone])    HAS THE PATIENT CONTACTED THEIR PHARMACY?  Yes  IS THIS A 90 DAY SUPPLY : Yes  IS PATIENT OUT OF MEDICATION: No  IF NOT; HOW MUCH IS LEFT: 1 day  LAST APPOINTMENT DATE: @10 /3/23  NEXT APPOINTMENT DATE:@5 /10/2022  DO WE HAVE YOUR PERMISSION TO LEAVE A DETAILED MESSAGE?:  Yes  OTHER COMMENTS:    **Let patient know to contact pharmacy at the end of the day to make sure medication is ready. **  ** Please notify patient to allow 48-72 hours to process**  **Encourage patient to contact the pharmacy for refills or they can request refills through [facility]**

## 2022-06-17 ENCOUNTER — Telehealth: Payer: Self-pay | Admitting: Radiology

## 2022-06-17 NOTE — Telephone Encounter (Signed)
Left a detailed message with instructions on answering machine per DPR. I also asked patient to arrive 15 minutes early and should he have questions to give [place] a call. Scheduled STRESS TEST on 06/19/22 at 7:45.

## 2022-06-17 NOTE — Telephone Encounter (Signed)
Left voice mail for Maxwell Fox to call back at [phone] to schedule Medicare Annual Wellness Visit    Last AWV:  no Hx of AWV    Please schedule Initial AWV with [facility]

## 2022-06-19 ENCOUNTER — Ambulatory Visit (HOSPITAL_COMMUNITY): Payer: Medicare Other | Attending: Internal Medicine

## 2022-06-19 ENCOUNTER — Other Ambulatory Visit (HOSPITAL_COMMUNITY): Payer: Medicare Other

## 2022-06-19 ENCOUNTER — Encounter: Payer: Self-pay | Admitting: Internal Medicine

## 2022-06-19 ENCOUNTER — Encounter: Payer: Self-pay | Admitting: Cardiovascular Disease

## 2022-06-19 ENCOUNTER — Ambulatory Visit (HOSPITAL_BASED_OUTPATIENT_CLINIC_OR_DEPARTMENT_OTHER): Payer: Medicare Other

## 2022-06-19 DIAGNOSIS — R0602 Shortness of breath: Secondary | ICD-10-CM

## 2022-06-19 DIAGNOSIS — R7989 Other specified abnormal findings of blood chemistry: Secondary | ICD-10-CM | POA: Insufficient documentation

## 2022-06-19 LAB — ECHOCARDIOGRAM COMPLETE
Area-P 1/2: 3.37 cm2
Calc EF: 42.3 %
Height: 72 in
S' Lateral: 2.9 cm
Single Plane A2C EF: 43.8 %
Single Plane A4C EF: 42.6 %
Weight: 2720 oz

## 2022-06-19 LAB — MYOCARDIAL PERFUSION IMAGING
LV dias vol: 94 mL (ref 62–150)
LV sys vol: 60 mL
Nuc Stress EF: 36 %
Peak HR: 93 {beats}/min
Rest HR: 75 {beats}/min
Rest Nuclear Isotope Dose: 10.5 mCi
SDS: 3
SRS: 9
SSS: 12
ST Depression (mm): 0 mm
Stress Nuclear Isotope Dose: 32.9 mCi
TID: 1.04

## 2022-06-19 MED ORDER — TECHNETIUM TC 99M TETROFOSMIN IV KIT
32.9000 | PACK | Freq: Once | INTRAVENOUS | Status: AC | PRN
  Administered 2022-06-19: 32.9 via INTRAVENOUS

## 2022-06-19 MED ORDER — OXYCODONE HCL 5 MG PO TABS: 5.0000 mg | ORAL_TABLET | ORAL | 0 refills | Status: DC | PRN

## 2022-06-19 MED ORDER — TECHNETIUM TC 99M TETROFOSMIN IV KIT
10.5000 | PACK | Freq: Once | INTRAVENOUS | Status: AC | PRN
  Administered 2022-06-19: 10.5 via INTRAVENOUS

## 2022-06-19 MED ORDER — PERFLUTREN LIPID MICROSPHERE
1.0000 mL | INTRAVENOUS | Status: AC | PRN
Start: 2022-06-19 — End: 2022-06-19
  Administered 2022-06-19: 6 mL via INTRAVENOUS

## 2022-06-19 MED ORDER — REGADENOSON 0.4 MG/5ML IV SOLN
0.4000 mg | Freq: Once | INTRAVENOUS | Status: AC
Start: 2022-06-19 — End: 2022-06-19
  Administered 2022-06-19: 0.4 mg via INTRAVENOUS

## 2022-06-23 ENCOUNTER — Telehealth: Payer: Self-pay

## 2022-06-23 NOTE — Telephone Encounter (Signed)
Left a detailed message for patient to return call to schedule with Herma Carson, PA on 5/29.

## 2022-06-23 NOTE — Telephone Encounter (Signed)
-----   Message from Dyann Kief, PA-C sent at 06/23/2022 11:10 AM EDT ----- Can you let patient know his heart function is lower than it has been in the past and Dr. Clifton James reviewed your stress test that is similar to before. Can you ask him how he's feeling and bring him in 07/01/22 on my schedule? thanks

## 2022-06-24 NOTE — Telephone Encounter (Signed)
Left detailed message for patient to call back re: appointment.  Sent mychart message as well.

## 2022-06-26 ENCOUNTER — Ambulatory Visit (INDEPENDENT_AMBULATORY_CARE_PROVIDER_SITE_OTHER): Payer: Medicare Other | Admitting: Internal Medicine

## 2022-06-26 ENCOUNTER — Encounter: Payer: Self-pay | Admitting: Internal Medicine

## 2022-06-26 VITALS — BP 120/70 | HR 71 | Ht 72.0 in | Wt 181.4 lb

## 2022-06-26 DIAGNOSIS — E273 Drug-induced adrenocortical insufficiency: Secondary | ICD-10-CM | POA: Diagnosis not present

## 2022-06-26 DIAGNOSIS — E038 Other specified hypothyroidism: Secondary | ICD-10-CM | POA: Diagnosis not present

## 2022-06-26 DIAGNOSIS — M810 Age-related osteoporosis without current pathological fracture: Secondary | ICD-10-CM | POA: Diagnosis not present

## 2022-06-26 MED ORDER — DENOSUMAB 60 MG/ML ~~LOC~~ SOSY
60.0000 mg | PREFILLED_SYRINGE | Freq: Once | SUBCUTANEOUS | Status: AC
Start: 2022-06-26 — End: 2022-06-26
  Administered 2022-06-26: 60 mg via SUBCUTANEOUS

## 2022-06-26 NOTE — Patient Instructions (Signed)
Please continue Levothyroxine 88 mcg daily. ? ?Take the thyroid hormone every day, with water, at least 30 minutes before breakfast, separated by at least 4 hours from: ?- acid reflux medications ?- calcium ?- iron ?- multivitamins ? ?Continue prednisone 5 mg daily. ? ?Please remember: ?- You absolutely need to take this medication every day and not skip doses. ?- Please double the dose if you have a fever, for the duration of the fever. ?- If you cannot take anything by mouth (vomiting) or you have severe diarrhea so that you eliminate the Prednisone pills in your stool, please make sure that you get the Prednisone in the vein instead - go to the nearest emergency department/urgent care or you may go to your PCPs office  ? ?Please come back for a follow-up appointment in 6 months. ?

## 2022-06-26 NOTE — Progress Notes (Signed)
Patient ID: Maxwell Fox, male   DOB: 01/22/1946, 77 y.o.   MRN: [id number]   HPI  Maxwell Fox is a 77 y.o.-year-old male, initially referred by his PCP, Dr. Lawerance Bach, returning for follow-up for central adrenal insufficiency,  central hypothyroidism, and clinical osteoporosis.  Last visit 6 months ago. Prev. Saw Dr. Julien Girt at [facility].  Interim history: Last year, his metastatic melanoma recurred - new lesions in proximal right tibia and right axillary lymph node (per review of the PET scan results  from 11/27/2020) >> treatment intensified. He had a lot of weakness and disequilibrium after his immune therapy was increased.  He was in physical therapy and since then, he started outpatient PT. he is feeling better. No increased fatigue, nausea, no weight loss. No falls or fractures since last visit.  He had Covid19 last month >> he took more Prednisone during the episode. He recovered well. Weaning off Oxycodone - hopefully in 3 month.  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, brain (pembrolizumab, ipilimumab)  He is on levothyroxine 88 mcg daily: - in am - fasting - at least 1h from b'fast - no Fe - + PPIs (Omeprazole) at night - + MVI and calcium at 4 PM  - not on Biotin  Reviewed patient's TFTs: 01/28/2021: fT4 1.49 (0.82-1.77) Lab Results  Component Value Date   TSH 0.449 05/16/2022   TSH 0.234 (L) 03/04/2020   TSH 0.53 08/18/2019   TSH 0.53 02/17/2019   TSH 0.550 08/16/2018   TSH 2.39 11/01/2014   FREET4 1.01 08/21/2020   FREET4 0.96 08/18/2019   FREET4 1.36 02/17/2019   T3FREE 3.8 08/21/2020   T3FREE 2.3 02/17/2019  10/28/2021: TSH 2.09 Reviewed records from [facility] ([facility] Everywhere):   Antithyroid antibodies: No results found for: "THGAB" No components found for: "TPOAB"  Pt denies: - feeling nodules in neck - hoarseness - dysphagia - choking  She has no FH of thyroid  disorders. No FH of thyroid cancer. No h/o radiation tx to head or neck. No herbal supplements. No Biotin use.   Central adrenal insufficiency:  His adrenal insufficiency was diagnosed in 2018, secondary to metastatic melanoma treatment.    21-hydroxylase antibodies were not elevated:   He is on prednisone 5 mg daily in the morning. He tried 3 and 6 mg >> he feels best on 5 mg.  Osteoporosis: - In 01/2020, he had a closed fracture of the superior is ramus of left pubis after falling in his bathroom.  He is now pain-free. In 2022, he also had GIB 2/2 staples still left at the site of his previous gastric bypass surgery. He had a colonoscopy and 4 EGDs. He was weak and fell again several times due to persistent anemia.  He actually had a pelvic refracture while in house when they were turning him.  We checked a bone density scan in 08/2020 and this showed osteoporosis: 08/22/2020 ([place]) Lumbar spine L1-L4 (L2) Femoral neck (FN) 33% distal radius  T-score -0.8 RFN: -2.6 LFN: -2.2 n/a   I suggested treatment with Reclast or Prolia, but at last visit I recommended to first discuss with his oncologist to see if he needs Zometa or Xgeva instead.  He started Prolia 12/12/2021.  Reviewed Vitamin D levels: Lab Results  Component Value Date   VD25OH 45.23 11/04/2021   VD25OH 66.31 08/21/2020   VD25OH 44.63 01/07/2017   VD25OH 47.11 11/01/2014   He is on  1000 units vitamin D supplement daily - missing some doses.  BMP levels reviewed: Component 04/10/22 03/18/21 01/23/21  Glucose Random - [facility] 91 88 90  Blood Urea Nitrogen - [facility] 16 15 13   Creatinine  - [facility] 1.05 1.20 1.16  EGFR (CKD-EPI 2021) - [facility] 73 63 66  Bun/Creatinine Ratio - [facility] 15 13 11   Sodium - [facility] 139 144 141  Potassium - [facility] 5.2 4.8 5.3 High   Chloride - [facility] 102 106 103  Carbon Dioxide - [facility] 24 19 Low  23  Calcium - [facility] 9.4 9.1 8.9  Protein Total - [facility] 5.8 Low  6.0 5.6 Low    Albumin - [facility] 3.7 Low  3.4 Low  3.5 Low   Globulin, Total - [facility] 2.1 2.6 2.1  A/G Ratio - [facility] 1.8 1.3 1.7  Bilirubin Total - [facility] 0.4 0.5 0.3  Alkaline Phosphatase - [facility] 62 98 76  AST (SGOT) - [facility] 26 25 17   ALT (SGPT) - [facility] 13 13 9   10/28/2021: 20/1.1, GFR 70, calcium 8.8 (8.7-10.2)  Of note, he has a history of R en Y gastric bypass in 2000 (initial weight : 350 lbs!),  HTN. He also had a TIA in 2018, but this appears to have resolved.   He has nocturia 1-2x a night -he takes Flomax.  He moved from [place] to [place] to be near her daughter and her 3 children.  Daughter is going through a difficult divorce.  ROS:+ see HPI + vitiligo hands and arms after immune treatment for his melanoma, also + easy bruising  I reviewed pt's medications, allergies, PMH, social hx, family hx, and changes were documented in the history of present illness. Otherwise, unchanged from my initial visit note.  Past Medical History:  Diagnosis Date   B12 nutritional deficiency    malabsorbtion   Blood transfusion without reported diagnosis 2022   BPH (benign prostatic hypertrophy)    Brain cancer (HCC) 2017   removed "golf ball sized mass"   Depression    on meds   Diverticulosis    GERD (gastroesophageal reflux disease)    on meds   GI bleeding 02/2020   Hypertension    Iron deficiency anemia    malabsorption related (s/p gastric bypass)   Melanoma (HCC)    Obesity    s/p gastric bypass 2000, start 355#   Pancreatic cyst    Pulmonary nodules    Thyroid disease    on meds   Past Surgical History:  Procedure Laterality Date   CHOLECYSTECTOMY  2000   COLONOSCOPY WITH PROPOFOL N/A 03/05/2020   Procedure: COLONOSCOPY WITH PROPOFOL;  Surgeon: Lemar Lofty., MD;  Location: [facility] ENDOSCOPY;  Service: Gastroenterology;  Laterality: N/A;   CRANIOTOMY FOR TUMOR  12/11/2015   metastatic melanoma   ENTEROSCOPY N/A 03/30/2020   Procedure: ENTEROSCOPY;  Surgeon:  Hilarie Fredrickson, MD;  Location: [facility] ENDOSCOPY;  Service: Endoscopy;  Laterality: N/A;   ESOPHAGOGASTRODUODENOSCOPY N/A 03/30/2020   Procedure: ESOPHAGOGASTRODUODENOSCOPY (EGD);  Surgeon: Hilarie Fredrickson, MD;  Location: [facility] ENDOSCOPY;  Service: Endoscopy;  Laterality: N/A;   ESOPHAGOGASTRODUODENOSCOPY (EGD) WITH PROPOFOL N/A 02/14/2020   Procedure: ESOPHAGOGASTRODUODENOSCOPY (EGD) WITH PROPOFOL;  Surgeon: Shellia Cleverly, DO;  Location: [facility] ENDOSCOPY;  Service: Endoscopy;  Laterality: N/A;   ESOPHAGOGASTRODUODENOSCOPY (EGD) WITH PROPOFOL N/A 02/26/2020   Procedure: ESOPHAGOGASTRODUODENOSCOPY (EGD) WITH PROPOFOL;  Surgeon: Hilarie Fredrickson, MD;  Location: [facility] ENDOSCOPY;  Service: Endoscopy;  Laterality: N/A;   ESOPHAGOGASTRODUODENOSCOPY (EGD) WITH PROPOFOL N/A 03/03/2020  Procedure: ESOPHAGOGASTRODUODENOSCOPY (EGD) WITH PROPOFOL;  Surgeon: Jeani Hawking, MD;  Location: [facility] ENDOSCOPY;  Service: Endoscopy;  Laterality: N/A;   ESOPHAGOGASTRODUODENOSCOPY (EGD) WITH PROPOFOL N/A 03/28/2020   Procedure: ESOPHAGOGASTRODUODENOSCOPY (EGD) WITH PROPOFOL;  Surgeon: Hilarie Fredrickson, MD;  Location: [facility] ENDOSCOPY;  Service: Endoscopy;  Laterality: N/A;   FOREIGN BODY REMOVAL  03/30/2020   Procedure: FOREIGN BODY REMOVAL;  Surgeon: Hilarie Fredrickson, MD;  Location: [facility] ENDOSCOPY;  Service: Endoscopy;;   GIVENS CAPSULE STUDY N/A 03/28/2020   Procedure: GIVENS CAPSULE STUDY;  Surgeon: Hilarie Fredrickson, MD;  Location: [facility] ENDOSCOPY;  Service: Endoscopy;  Laterality: N/A;   HEMOSTASIS CONTROL  02/26/2020   Procedure: HEMOSTASIS CONTROL;  Surgeon: Hilarie Fredrickson, MD;  Location: [facility] ENDOSCOPY;  Service: Endoscopy;;   HEMOSTASIS CONTROL  03/30/2020   Procedure: HEMOSTASIS CONTROL;  Surgeon: Hilarie Fredrickson, MD;  Location: [facility] ENDOSCOPY;  Service: Endoscopy;;   HOT HEMOSTASIS N/A 02/26/2020   Procedure: HOT HEMOSTASIS (ARGON PLASMA COAGULATION/BICAP);  Surgeon: Hilarie Fredrickson, MD;  Location: [facility] ENDOSCOPY;  Service: Endoscopy;  Laterality: N/A;   MOHS  SURGERY  2012   melanoma, mid back, [place]   ROUX-EN-Y PROCEDURE  2000   TOTAL SHOULDER REPLACEMENT Right 2011   [place]   WISDOM TOOTH EXTRACTION     Social History   Socioeconomic History   Marital status: Married    Spouse name: Not on file   Number of children: 2   Years of education: Not on file   Highest education level: Not on file  Occupational History   retired  Tobacco Use   Smoking status: Former Smoker    Quit date: 02/03/1963    Years since quitting: 56.0   Smokeless tobacco: Never Used  Substance and Sexual Activity   Alcohol use: Not on file    Comment: wine 2x/week   Drug use: Not on file   Sexual activity: Not on file  Other Topics Concern   Not on file  Social History Narrative   Lives with wife   Retired - Geologist, 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 [employer]   Social Determinants of Corporate investment banker Strain:    Difficulty of Paying Living Expenses: Not on [employer] Insecurity:    Worried About Programme researcher, broadcasting/film/video in the Last Year: Not on file   [employer] of Food in the Last Year: Not on file  Transportation Needs:    Freight forwarder (Medical): Not on file   Lack of Transportation (Non-Medical): Not on file  Physical Activity:    Days of Exercise per Week: Not on file   Minutes of Exercise per Session: Not on file  Stress:    Feeling of Stress : Not on file  Social Connections:    Frequency of Communication with Friends and Family: Not on file   Frequency of Social Gatherings with Friends and Family: Not on file   Attends Religious Services: Not on file   Active Member of Clubs or Organizations: Not on file   Attends Banker Meetings: Not on file   Marital Status: Not on file  Intimate Partner Violence:    Fear of Current or Ex-Partner: Not on file   Emotionally Abused: Not on file   Physically Abused: Not on file   Sexually Abused: Not on file   Current Outpatient Medications on File Prior to Visit   Medication Sig Dispense Refill   acetaminophen (TYLENOL) 325 MG tablet Take 2  tablets (650 mg total) by mouth every 6 (six) hours as needed for moderate pain.     calcium carbonate (OS-CAL - DOSED IN MG OF ELEMENTAL CALCIUM) 1250 (500 Ca) MG tablet Take 1 tablet by mouth daily with supper.     Cholecalciferol (VITAMIN D) 2000 UNITS tablet Take 1 tablet (2,000 Units total) by mouth daily. 30 tablet 11   clonazePAM (KLONOPIN) 1 MG tablet TAKE 1 TABLET BY MOUTH TWICE DAILY AS NEEDED FOR ANXIETY 60 tablet 2   DULoxetine (CYMBALTA) 30 MG capsule Take 1 capsule (30 mg total) by mouth daily. 90 capsule 1   finasteride (PROSCAR) 5 MG tablet Take 1 tablet (5 mg total) by mouth daily. 90 tablet 1   levothyroxine (SYNTHROID) 88 MCG tablet TAKE 1 TABLET(88 MCG) BY MOUTH DAILY BEFORE AND BREAKFAST 45 tablet 0   Multiple Vitamin (MULTIVITAMIN WITH MINERALS) TABS tablet Take 1 tablet by mouth daily.     omeprazole (PRILOSEC) 40 MG capsule Take 1 capsule (40 mg total) by mouth daily. 90 capsule 3   ondansetron (ZOFRAN) 4 MG tablet TAKE 1 TABLET(4 MG) BY MOUTH EVERY 8 HOURS AS NEEDED FOR NAUSEA OR VOMITING 90 tablet 1   oxyCODONE (ROXICODONE) 5 MG immediate release tablet Take 1 tablet (5 mg total) by mouth every 5 (five) hours as needed for severe pain (chronic back pain). 150 tablet 0   predniSONE (DELTASONE) 5 MG tablet TAKE 1 TABLET BY MOUTH EVERY DAY WITH BREAKFAST 90 tablet 3   sucralfate (CARAFATE) 1 GM/10ML suspension Take 1 g by mouth 4 (four) times daily -  with meals and at bedtime. (Patient not taking: Reported on 05/21/2022)     tamsulosin (FLOMAX) 0.4 MG CAPS capsule TAKE 1 CAPSULE BY MOUTH EVERY DAY AFTER SUPPER 90 capsule 1   No current facility-administered medications on file prior to visit.   No Known Allergies  Family History  Problem Relation Age of Onset   Hypertension Father    Macular degeneration Mother    Melanoma Mother    Melanoma Sister    Stomach cancer Sister 72        duodenal   Breast cancer Sister    Uterine cancer Sister    Melanoma Brother    Colon polyps Neg Hx    Colon cancer Neg Hx    Esophageal cancer Neg Hx    Rectal cancer Neg Hx    PE: BP 120/70 (BP Location: Left Arm, Patient Position: Sitting, Cuff Size: Normal)   Pulse 71   Ht 6' (1.829 m)   Wt 181 lb 6.4 oz (82.3 kg)   SpO2 99%   BMI 24.60 kg/m  Wt Readings from Last 3 Encounters:  06/26/22 181 lb 6.4 oz (82.3 kg)  06/19/22 170 lb (77.1 kg)  05/21/22 170 lb (77.1 kg)   Constitutional: normal weight in NAD, frail Eyes: EOMI, no exophthalmos ENT: no thyromegaly, no cervical lymphadenopathy Cardiovascular: RRR, No MRG, wears compression hoses Respiratory: CTA B Musculoskeletal: no deformities Skin: moist, warm, no rashes, bruise L lower lip Neurological: no tremor with outstretched hands  ASSESSMENT: 1. Central Hypothyroidism  2. Central adrenal insufficiency  3.  Osteoporosis - h/o Ramus pubis fracture  PLAN:  1. Patient with at least a component of central hypothyroidism, on levothyroxine treatment -Possibly related to ipilimumab, but also possibly due to pembrolizumab (although this most likely causes thyroiditis, more frequently than hypophysitis) - His TSH levels are actually normal, so he appears to have at least some central TSH production,  with the latest level being last month.  We discussed the free T4 levels are usually more accurate in following send 0 hypothyroidism patients, however, in his case, I believe that we could follow his TSH levels for now. - he continues on LT4 88 mcg daily - pt feels good on this dose. - we discussed about taking the thyroid hormone every day, with water, >30 minutes before breakfast, separated by >4 hours from acid reflux medications, calcium, iron, multivitamins. Pt. is taking it correctly. - At next visit, we will try to check a free T4 -I advised him to come in the morning, around 8 AM, and without taking his prednisone and  levothyroxine that morning.  He could bring these with him and take them right after the blood draw. - I will see him back in 6 months  2.  Central adrenal insufficiency -Continues on 5 mg of prednisone daily >> he feels that this is the right dose for him.  He tried 3 mg daily but this did not work well for him. -No cortisol or ACTH levels needed for follow-up -No adrenal insufficiency symptoms: No increased weakness, dizziness, weight loss, abdominal pain, headaches, anorexia -We again reviewed sick day rules.  We discussed about increasing the dose of prednisone x 2 in case of fever and get the medication parenterally if he could not keep tablets down.  He already did during his COVID-19 episode. -He does have a steroid solution at home to inject in case of an emergency  3.  Osteoporosis -He has a history of ramus pubis fracture after he fell from level ground -His bone density showed osteoporosis in 08/2020 -He does have a history of metastatic melanoma we discussed in the past about checking with oncology to see if he may need to use Xgeva or Zometa.  However, since this was not in plan, at last visit I suggested Prolia.  We started application for this.  We discussed about benefits and possible side effects. -he was able to start Prolia 12/12/2021.  He already did well, without jaw/thigh/hip pain.  Will give him another injection today. -His calcium level was slightly low, at 8.5 (8.7-10.2) on 04/16/2021, however, an albumin level was also low at that time so the corrected calcium was actually normal, at 9.2.  Alkaline phosphatase was slightly high, at 126 (24-110).  He had a more recent calcium level which was normal 04/2022. -We checked his vitamin D level at last visit and this was normal, 45.23 in 11/2021.  He continues 1000 units vitamin D daily. -He is on prednisone 5 mg daily -We discussed about avoiding falls -he did not have falls or fractures since last visit -He is doing outpatient  physical therapy -We discussed about repeating his bone density scan approximately 1 year after starting Prolia.  Will order this at next visit. -will see him back in 6 months   Carlus Pavlov, MD PhD [facility] Endocrinology

## 2022-06-26 NOTE — Progress Notes (Signed)
Patient verbally confirmed name, date of birth, and correct medication to be administered. Prolia injection administered and pt tolerated well.  

## 2022-07-01 DIAGNOSIS — C439 Malignant melanoma of skin, unspecified: Secondary | ICD-10-CM | POA: Diagnosis not present

## 2022-07-01 DIAGNOSIS — E273 Drug-induced adrenocortical insufficiency: Secondary | ICD-10-CM | POA: Diagnosis not present

## 2022-07-01 DIAGNOSIS — R531 Weakness: Secondary | ICD-10-CM | POA: Diagnosis not present

## 2022-07-01 DIAGNOSIS — C7931 Secondary malignant neoplasm of brain: Secondary | ICD-10-CM | POA: Diagnosis not present

## 2022-07-01 DIAGNOSIS — E230 Hypopituitarism: Secondary | ICD-10-CM | POA: Diagnosis not present

## 2022-07-01 DIAGNOSIS — Z9221 Personal history of antineoplastic chemotherapy: Secondary | ICD-10-CM | POA: Diagnosis not present

## 2022-07-01 DIAGNOSIS — C779 Secondary and unspecified malignant neoplasm of lymph node, unspecified: Secondary | ICD-10-CM | POA: Diagnosis not present

## 2022-07-01 DIAGNOSIS — Z79899 Other long term (current) drug therapy: Secondary | ICD-10-CM | POA: Diagnosis not present

## 2022-07-02 ENCOUNTER — Telehealth: Payer: Self-pay

## 2022-07-02 NOTE — Telephone Encounter (Signed)
Left detailed VM offering to move patient's appointment to Herma Carson, PA schedule.

## 2022-07-03 DIAGNOSIS — D225 Melanocytic nevi of trunk: Secondary | ICD-10-CM | POA: Diagnosis not present

## 2022-07-03 DIAGNOSIS — Z08 Encounter for follow-up examination after completed treatment for malignant neoplasm: Secondary | ICD-10-CM | POA: Diagnosis not present

## 2022-07-03 DIAGNOSIS — Z85820 Personal history of malignant melanoma of skin: Secondary | ICD-10-CM | POA: Diagnosis not present

## 2022-07-03 DIAGNOSIS — L821 Other seborrheic keratosis: Secondary | ICD-10-CM | POA: Diagnosis not present

## 2022-07-03 DIAGNOSIS — L814 Other melanin hyperpigmentation: Secondary | ICD-10-CM | POA: Diagnosis not present

## 2022-07-03 NOTE — Telephone Encounter (Signed)
Last Prolia inj 06/26/22 Next Prolia inj due 12/28/22 

## 2022-07-05 NOTE — Progress Notes (Unsigned)
Cardiology Office Note:    Date:  07/06/2022   ID:  Maxwell Fox, DOB Mar 16, 1945, MRN [id number]  PCP:  Pincus Sanes, MD   [facility] Providers Cardiologist:  Verne Carrow, MD     Referring MD: Pincus Sanes, MD   Chief Complaint: follow-up abnormal nuclear stress test  History of Present Illness:    Maxwell Fox is a very pleasant 77 y.o. male with a hx of sinus bradycardia, mild AAS, BPH, hypertension, HLD, iron deficiency anemia, melanoma with brain metastasis, and obesity s/p gastric bypass.   Had an episode of dizziness July 2020 and HR was in the 30s. He was seen in the ED and had PVCs with bigeminy.  He had an echo and stress test and Dr. Annitta Jersey office.  Echo 08/30/2018 LVEF 50%, G1 DD, mild LVH, mild MR, mild AS. Nuclear stress test 08/24/2018, no ischemia. Metastatic melanoma with treatment at [facility] and is in remission.  He had a tumor removal and radiation.  He has adrenal insufficiency post immunotherapy and is on daily prednisone.  He was admitted to [facility] February 2022 with upper GI bleeding due to AVMs.  Patient was in the ED 05/16/2022 with headache, generalized weakness. MRI showed chronic temporal lobe lesion with mild edema. He was trying to wean himself off oxycodone. Hs Troponins 73 >> 69.  EKG revealed TWI in lead III. He wanted to go home, but daughter was saying he had worsening DOE.  Could only take 10 steps and was out of breath.  SOB with deep breathing or talking.  Came in in a wheelchair due to weakness.  Never used a cane or walker before.  Wife was at home with flulike symptoms. He had diarrhea but it was improving. WBC 3.1 in the ED. He denied chest pain.  Seen in clinic by Jacolyn Reedy, PA on 05/20/22 at which time he continued to have DOE and shortness of breath at rest and with talking. CT ordered to rule out PE.  He was diagnosed with COVID-19 after testing at home due to symptoms during clinic visit. Echo 06/19/22 revealed LVEF 30-35%, G1DD,  normal RV function and size, mild MR, mild calcification of aortic valve with no aortic stenosis, borderline dilatation of ascending aorta, measuring 38 mm. Nuclear stress test revealed findings consistent with infarction with peri-infarct ischemia, intermediate risk.  Medium defect with severe reduction in uptake present in the apical to basal inferior and inferior septal location that is partially reversible, abnormal wall motion in this area, consistent with infarction and peri-infarct ischemia. EF previously 50% in 2020. Per Dr. Clifton James, "I think we should get him back into the office and see how he is feeling. His nuclear stress test looks overall unchanged from before but the EF on the echo is lower. His other echo was in Dr. Annitta Jersey office so I can't compare images. If we see him back and he is still having dyspnea after recovering from Covid, I think a cath is reasonable."  Today, he is here alone for follow-up. Reports he is feeling great. Is having no further symptoms of shortness of breath. Is walking 2 miles daily with no dyspnea, chest discomfort or activity intolerance. Walking pace is consistent. No palpitations or chest pain. No orthopnea, edema, PND, presyncope, syncope. Believes his symptoms were secondary to Covid infection because symptoms have resolved. Has mild bilateral lower extremity edema. He denies orthopnea, PND, presyncope, syncope. On oxycodone reduction program (2/2 prior hip fracture and cancer). Is having frequent  PVCs on EKG. BP has been well controlled at home.   Past Medical History:  Diagnosis Date   B12 nutritional deficiency    malabsorbtion   Blood transfusion without reported diagnosis 2022   BPH (benign prostatic hypertrophy)    Brain cancer (HCC) 2017   removed "golf ball sized mass"   Depression    on meds   Diverticulosis    GERD (gastroesophageal reflux disease)    on meds   GI bleeding 02/2020   Hypertension    Iron deficiency anemia     malabsorption related (s/p gastric bypass)   Melanoma (HCC)    Obesity    s/p gastric bypass 2000, start 355#   Pancreatic cyst    Pulmonary nodules    Thyroid disease    on meds    Past Surgical History:  Procedure Laterality Date   CHOLECYSTECTOMY  2000   COLONOSCOPY WITH PROPOFOL N/A 03/05/2020   Procedure: COLONOSCOPY WITH PROPOFOL;  Surgeon: Lemar Lofty., MD;  Location: [facility] ENDOSCOPY;  Service: Gastroenterology;  Laterality: N/A;   CRANIOTOMY FOR TUMOR  12/11/2015   metastatic melanoma   ENTEROSCOPY N/A 03/30/2020   Procedure: ENTEROSCOPY;  Surgeon: Hilarie Fredrickson, MD;  Location: [facility] ENDOSCOPY;  Service: Endoscopy;  Laterality: N/A;   ESOPHAGOGASTRODUODENOSCOPY N/A 03/30/2020   Procedure: ESOPHAGOGASTRODUODENOSCOPY (EGD);  Surgeon: Hilarie Fredrickson, MD;  Location: [facility] ENDOSCOPY;  Service: Endoscopy;  Laterality: N/A;   ESOPHAGOGASTRODUODENOSCOPY (EGD) WITH PROPOFOL N/A 02/14/2020   Procedure: ESOPHAGOGASTRODUODENOSCOPY (EGD) WITH PROPOFOL;  Surgeon: Shellia Cleverly, DO;  Location: [facility] ENDOSCOPY;  Service: Endoscopy;  Laterality: N/A;   ESOPHAGOGASTRODUODENOSCOPY (EGD) WITH PROPOFOL N/A 02/26/2020   Procedure: ESOPHAGOGASTRODUODENOSCOPY (EGD) WITH PROPOFOL;  Surgeon: Hilarie Fredrickson, MD;  Location: [facility] ENDOSCOPY;  Service: Endoscopy;  Laterality: N/A;   ESOPHAGOGASTRODUODENOSCOPY (EGD) WITH PROPOFOL N/A 03/03/2020   Procedure: ESOPHAGOGASTRODUODENOSCOPY (EGD) WITH PROPOFOL;  Surgeon: Jeani Hawking, MD;  Location: [facility] ENDOSCOPY;  Service: Endoscopy;  Laterality: N/A;   ESOPHAGOGASTRODUODENOSCOPY (EGD) WITH PROPOFOL N/A 03/28/2020   Procedure: ESOPHAGOGASTRODUODENOSCOPY (EGD) WITH PROPOFOL;  Surgeon: Hilarie Fredrickson, MD;  Location: [facility] ENDOSCOPY;  Service: Endoscopy;  Laterality: N/A;   FOREIGN BODY REMOVAL  03/30/2020   Procedure: FOREIGN BODY REMOVAL;  Surgeon: Hilarie Fredrickson, MD;  Location: [facility] ENDOSCOPY;  Service: Endoscopy;;   GIVENS CAPSULE STUDY N/A 03/28/2020   Procedure: GIVENS CAPSULE  STUDY;  Surgeon: Hilarie Fredrickson, MD;  Location: [facility] ENDOSCOPY;  Service: Endoscopy;  Laterality: N/A;   HEMOSTASIS CONTROL  02/26/2020   Procedure: HEMOSTASIS CONTROL;  Surgeon: Hilarie Fredrickson, MD;  Location: [facility] ENDOSCOPY;  Service: Endoscopy;;   HEMOSTASIS CONTROL  03/30/2020   Procedure: HEMOSTASIS CONTROL;  Surgeon: Hilarie Fredrickson, MD;  Location: [facility] ENDOSCOPY;  Service: Endoscopy;;   HOT HEMOSTASIS N/A 02/26/2020   Procedure: HOT HEMOSTASIS (ARGON PLASMA COAGULATION/BICAP);  Surgeon: Hilarie Fredrickson, MD;  Location: [facility] ENDOSCOPY;  Service: Endoscopy;  Laterality: N/A;   MOHS SURGERY  2012   melanoma, mid back, [place]   ROUX-EN-Y PROCEDURE  2000   TOTAL SHOULDER REPLACEMENT Right 2011   [place]   WISDOM TOOTH EXTRACTION      Current Medications: Current Meds  Medication Sig   acetaminophen (TYLENOL) 325 MG tablet Take 2 tablets (650 mg total) by mouth every 6 (six) hours as needed for moderate pain.   calcium carbonate (OS-CAL - DOSED IN MG OF ELEMENTAL CALCIUM) 1250 (500 Ca) MG tablet Take 1 tablet by mouth daily with supper.   carvedilol (COREG) 3.125 MG  tablet Take 1 tablet (3.125 mg total) by mouth 2 (two) times daily with a meal.   Cholecalciferol (VITAMIN D) 2000 UNITS tablet Take 1 tablet (2,000 Units total) by mouth daily.   clonazePAM (KLONOPIN) 1 MG tablet TAKE 1 TABLET BY MOUTH TWICE DAILY AS NEEDED FOR ANXIETY   DULoxetine (CYMBALTA) 30 MG capsule Take 1 capsule (30 mg total) by mouth daily.   finasteride (PROSCAR) 5 MG tablet Take 1 tablet (5 mg total) by mouth daily.   levothyroxine (SYNTHROID) 88 MCG tablet TAKE 1 TABLET(88 MCG) BY MOUTH DAILY BEFORE AND BREAKFAST   Multiple Vitamin (MULTIVITAMIN WITH MINERALS) TABS tablet Take 1 tablet by mouth daily.   omeprazole (PRILOSEC) 40 MG capsule Take 1 capsule (40 mg total) by mouth daily.   ondansetron (ZOFRAN) 4 MG tablet TAKE 1 TABLET(4 MG) BY MOUTH EVERY 8 HOURS AS NEEDED FOR NAUSEA OR VOMITING   oxyCODONE (ROXICODONE) 5 MG  immediate release tablet Take 1 tablet (5 mg total) by mouth every 5 (five) hours as needed for severe pain (chronic back pain).   predniSONE (DELTASONE) 5 MG tablet TAKE 1 TABLET BY MOUTH EVERY DAY WITH BREAKFAST   sacubitril-valsartan (ENTRESTO) 24-26 MG Take 1 tablet by mouth 2 (two) times daily.   sucralfate (CARAFATE) 1 GM/10ML suspension Take 1 g by mouth 4 (four) times daily -  with meals and at bedtime.   tamsulosin (FLOMAX) 0.4 MG CAPS capsule TAKE 1 CAPSULE BY MOUTH EVERY DAY AFTER SUPPER     Allergies:   Patient has no known allergies.   Social History   Socioeconomic History   Marital status: Married    Spouse name: Not on file   Number of children: 2   Years of education: Not on file   Highest education level: Not on file  Occupational History   Occupation: general contractor-retired  Tobacco Use   Smoking status: Former    Types: Cigarettes    Quit date: 02/03/1963    Years since quitting: 59.4   Smokeless tobacco: Never  Vaping Use   Vaping Use: Never used  Substance and Sexual Activity   Alcohol use: Not Currently    Comment: wine 2x/week   Drug use: Never   Sexual activity: Not on file  Other Topics Concern   Not on file  Social History Narrative   Lives with wife   Retired - Geologist, 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 [employer] investment banker Strain: Low Risk  (12/10/2020)   Overall Financial Resource Strain (CARDIA)    Difficulty of Paying Living Expenses: Not hard at all  Food Insecurity: No Food Insecurity (02/23/2020)   Hunger Vital Sign    Worried About Running Out of Food in the Last Year: Never true    Ran Out of Food in the Last Year: Never true  Transportation Needs: Unknown (12/10/2020)   PRAPARE - Administrator, Civil Service (Medical): No    Lack of Transportation (Non-Medical): Not on file  Physical Activity: Sufficiently Active (12/10/2020)   Exercise Vital Sign    Days of Exercise  per Week: 5 days    Minutes of Exercise per Session: 30 min  Stress: No Stress Concern Present (12/10/2020)   [employer] of Occupational Health - Occupational Stress Questionnaire    Feeling of Stress : Not at all  Social Connections: Socially Integrated (12/10/2020)   Social Connection and Isolation Panel [NHANES]    Frequency of  Communication with Friends and Family: More than three times a week    Frequency of Social Gatherings with Friends and Family: More than three times a week    Attends Religious Services: More than 4 times per year    Active Member of [employer] or Organizations: Yes    Attends Engineer, structural: More than 4 times per year    Marital Status: Married     Family History: The patient's family history includes Breast cancer in his sister; Hypertension in his father; Macular degeneration in his mother; Melanoma in his brother, mother, and sister; Stomach cancer (age of onset: 71) in his sister; Uterine cancer in his sister. There is no history of Colon polyps, Colon cancer, Esophageal cancer, or Rectal cancer.  ROS:   Please see the history of present illness.   All other systems reviewed and are negative.  Labs/Other Studies Reviewed:    The following studies were reviewed today:  Lexiscan Myoview 06/19/22   Findings are consistent with infarction with peri-infarct ischemia. The study is intermediate risk.   No ST deviation was noted.   LV perfusion is abnormal. Defect 1: There is a medium defect with severe reduction in uptake present in the apical to basal inferior and inferoseptal location(s) that is partially reversible. There is abnormal wall motion in the defect area. Consistent with infarction and peri-infarct ischemia.   Left ventricular function is abnormal. Global function is moderately reduced. Nuclear stress EF: 36 %. The left ventricular ejection fraction is moderately decreased (30-44%). End diastolic cavity size is mildly enlarged. End  systolic cavity size is normal.   Prior study available for comparison from 08/24/2018. Inferoseptal infarct, LVEF 44%   Medium size, severe intensity mostly fixed (SDS 3) defect of the basal to apical inferior wall and mid-apical inferoseptal walls, consistent with infarct and minimal peri-infarct ischemia. LVEF 36% with inferior and inferoseptal akinesis. This is an intermediate risk study. Compared to a prior study in 2020, the fixed defect, which is suspected to be a scar, is unchanged. LVEF is now reported to be lower - echo correlation is recommended.   Echo 06/19/22 1. Left ventricular ejection fraction, by estimation, is 30 to 35%. The  left ventricle has moderately decreased function. The left ventricle has  no regional wall motion abnormalities. Left ventricular diastolic  parameters are consistent with Grade I  diastolic dysfunction (impaired relaxation).   2. Right ventricular systolic function is normal. The right ventricular  size is normal.   3. The mitral valve is normal in structure. Mild mitral valve  regurgitation. No evidence of mitral stenosis.   4. The aortic valve is normal in structure. There is mild calcification  of the aortic valve. There is mild thickening of the aortic valve. Aortic  valve regurgitation is not visualized. No aortic stenosis is present.   5. Aortic dilatation noted. There is borderline dilatation of the  ascending aorta, measuring 38 mm.   6. The inferior vena cava is normal in size with greater than 50%  respiratory variability, suggesting right atrial pressure of 3 mmHg.   CTA Chest PE 05/20/22 IMPRESSION: 1. Technically adequate exam showing no acute pulmonary embolus. 2. Coronary artery calcifications. 3. Prior cholecystectomy and gastric bypass.   Recent Labs: 05/16/2022: ALT 17; TSH 0.449 05/20/2022: BUN 13; Creatinine, Ser 1.05; Hemoglobin 13.6; Platelets 179; Potassium 5.2; Sodium 138  Recent Lipid Panel    Component Value Date/Time    CHOL 195 01/07/2017 1034   TRIG 152.0 (H) 01/07/2017  1034   HDL 73.20 01/07/2017 1034   CHOLHDL 3 01/07/2017 1034   VLDL 30.4 01/07/2017 1034   LDLCALC 91 01/07/2017 1034     Risk Assessment/Calculations:           Physical Exam:    VS:  BP 126/72   Pulse 81   Ht 5\' 11"  (1.803 m)   Wt 179 lb (81.2 kg)   SpO2 98%   BMI 24.97 kg/m     Wt Readings from Last 3 Encounters:  07/06/22 179 lb (81.2 kg)  06/26/22 181 lb 6.4 oz (82.3 kg)  06/19/22 170 lb (77.1 kg)     GEN:  Well nourished, well developed in no acute distress HEENT: Normal NECK: No JVD; No carotid bruits CARDIAC: Irregular RR, no murmurs, rubs, gallops RESPIRATORY:  Clear to auscultation without rales, wheezing or rhonchi  ABDOMEN: Soft, non-tender, non-distended MUSCULOSKELETAL:  No edema; No deformity. 2+ pedal pulses, equal bilaterally SKIN: Warm and dry NEUROLOGIC:  Alert and oriented x 3 PSYCHIATRIC:  Normal affect   EKG:  EKG is ordered today.  The ekg ordered today demonstrates sinus rhythm at 81 bpm with frequent PVCs       Diagnoses:    1. Acute on chronic combined systolic and diastolic congestive heart failure (HCC)   2. Nonrheumatic mitral valve regurgitation   3. Abnormal stress test   4. Frequent PVCs   5. Coronary artery calcification seen on CT scan   6. Adrenal insufficiency due to cancer therapy [facility])    Assessment and Plan:     Acute on chronic combined  CHF: Echo 06/19/2022 revealed newly reduced LVEF 30 to 35%, G1 DD. EF on lexiscan myoview 06/19/22 reports LVEF 36%. Previous echo with EF 50% 2020.  He reported DOE and shortness of breath at office visit on 05/20/2022 with recent ED visit for same symptoms. Was diagnosed with COVID infection shortly after. Reports symptoms have dissipated and he no longer has any symptoms. Has resumed walking 2 miles daily without shortness of breath, dyspnea, chest pain or palpitations. Lengthy discussion about potential causes of cardiomyopathy.  He suspects is a result of immunotherapy for melanoma. Appears euvolemic on exam. Weight is stable. 2 L fluid restriction and low sodium diet encouraged. We will initiate GDMT including Entresto 24-26 mg BID and carvedilol 3.125 mg twice daily. Will get BMP in 2 weeks and see him back in 4 weeks. Information provided.   Frequent PVCs: EKG with frequent PVCs in trigeminy pattern with HR 81 bpm. He is asymptomatic.  Advised these could be contributing to reduced EF. Will start carvedilol 3.125 mg twice daily. Consider cardiac monitor to evaluate PVC burden.   Abnormal stress test: [facility] 06/19/2022 that revealed medium defect in apical to basal inferior and inferior septal locations that is partially reversible with abnormal wall motion in the defect area consistent with infarction and peri-infarct ischemia. Reduced LVEF. History of coronary artery calcifications noted on chest CT 05/20/2022. He is asymptomatic. Previously noted DOE and shortness of breath that resolved after recovering from COVID infection. GDMT for CHF as noted above. Advised him to notify us if he develops symptoms concerning for angina.   Mitral regurgitation: Mild MR on echo 06/19/2022. Continue to monitor clinically for now.   Coronary artery calcification on CT: We need to update lipid panel and check lipoprotein a. Will get these at his lab appointment for medication management in 7-10 days.   Adrenal insufficiency: On chronic prednisone 5 mg daily. Management per  endocrinology.      Disposition: 4 weeks with me  Medication Adjustments/Labs and Tests Ordered: Current medicines are reviewed at length with the patient today.  Concerns regarding medicines are outlined above.  Orders Placed This Encounter  Procedures   Basic Metabolic Panel (BMET)   EKG 12-Lead   Meds ordered this encounter  Medications   sacubitril-valsartan (ENTRESTO) 24-26 MG    Sig: Take 1 tablet by mouth 2 (two) times daily.    Dispense:  60  tablet    Refill:  6    Please Honor Card patient is presenting for Cecelia Byars: [id number]; Alvira Philips: [id number]; RXPCN: [id number]; RXID: [id number]   carvedilol (COREG) 3.125 MG tablet    Sig: Take 1 tablet (3.125 mg total) by mouth 2 (two) times daily with a meal.    Dispense:  60 tablet    Refill:  6    Patient Instructions  Medication Instructions:   START Entresto one (1) tablet by mouth ( 24-26 mg) twice daily,.  START  Carvedilol one (1) tablet by mouth ( 3.125 mg) twice daily.    *If you need a refill on your cardiac medications before your next appointment, please call your pharmacy*   Lab Work:  Your physician recommends that you return for lab work on Tuesday, June 11. You can come in on the day of your appointment anytime between 7:30-4:30.    If you have labs (blood work) drawn today and your tests are completely normal, you will receive your results only by: MyChart Message (if you have MyChart) OR A paper copy in the mail If you have any lab test that is abnormal or we need to change your treatment, we will call you to review the results.   Testing/Procedures:  None ordered.   Follow-Up: At [facility], you and your health needs are our priority.  As part of our continuing mission to provide you with exceptional heart care, we have created designated Provider Care Teams.  These Care Teams include your primary Cardiologist (physician) and Advanced Practice Providers (APPs -  Physician Assistants and Nurse Practitioners) who all work together to provide you with the care you need, when you need it.  We recommend signing up for the patient portal called "MyChart".  Sign up information is provided on this After Visit Summary.  MyChart is used to connect with patients for Virtual Visits (Telemedicine).  Patients are able to view lab/test results, encounter notes, upcoming appointments, etc.  Non-urgent messages can be sent to your provider as well.   To learn more  about what you can do with MyChart, go to [email or web address].    Your next appointment:   3 week(s)  Provider:   Eligha Bridegroom, NP         Other Instructions  Heart Failure, Diagnosis  Heart failure means that your heart is not able to pump blood in the right way. This makes it hard for your body to work well. Heart failure is usually a long-term (chronic) condition. You must take good care of yourself and follow your treatment plan from your doctor. Different stages of heart failure have different treatment plans. The stages are: Stage A: At risk for heart failure. Stage B: Pre-heart failure. Stage C: Symptomatic heart failure. Stage D: Advanced heart failure. What are the causes? High blood pressure. Buildup of cholesterol and fat in the arteries. Heart attack. This injures the heart muscle. Heart valves that do not open and close properly. Damage of the heart  muscle. This is also called cardiomyopathy. Infection of the heart muscle. This is also called myocarditis. Lung disease. What increases the risk? Getting older. The risk of heart failure goes up as a person ages. Being overweight. Using tobacco or nicotine products. Abusing alcohol or drugs. Having taken medicines that can damage the heart. Having any of these conditions: Diabetes. Abnormal heart rhythms. Thyroid problems. Low blood counts (anemia). Having a family history of heart failure. What are the signs or symptoms? Shortness of breath. Coughing. Swelling of the feet, ankles, legs, or belly. Losing or gaining weight for no reason. Trouble breathing. Waking from sleep because of the need to sit up and get more air. Fast heartbeat. Other symptoms may include: Being very tired. Feeling dizzy, or feeling like you may pass out (faint). Having no desire to eat. Feeling like you may vomit (nauseous). Peeing (urinating) more at night. Feeling confused. How is this treated? This condition may be  treated with: Medicines. These can be given to treat blood pressure and to make the heart muscles stronger. Changes in your daily life. These may include: Eating a healthy diet. Staying at a healthy body weight. Quitting tobacco, alcohol, and drug use. Doing exercises. Participating in a cardiac rehabilitation program. This program helps you improve your health through exercise, education, and counseling. Surgery. Surgery can be done to open blocked valves or to put devices in the heart, such as pacemakers. A donor heart (heart transplant). You will receive a healthy heart from a donor. Follow these instructions at home: Treat other conditions as told by your doctor. These may include high blood pressure, diabetes, thyroid disease, or abnormal heart rhythms. Learn as much as you can about heart failure. Get support as you need it. Keep all follow-up visits. Where to find more information American Heart Association: www.heart.org Centers for Disease Control and Prevention: FootballExhibition.com.br [employer] on Aging: https://walker.com/ Summary Heart failure means that your heart is not able to pump blood in the right way. This condition is often caused by high blood pressure, heart attack, or damage of the heart muscle. Symptoms of this condition include shortness of breath and swelling of the feet, ankles, legs, or belly. You may also feel very tired or feel like you may vomit. You may be treated with medicines, surgery, or changes in your daily life. Treat other health conditions as told by your doctor. This information is not intended to replace advice given to you by your health care provider. Make sure you discuss any questions you have with your health care provider. Document Revised: 07/18/2020 Document Reviewed: 08/12/2019 Elsevier Patient Education  2024 Elsevier Inc.  Heart Failure Education: Weigh yourself EVERY morning after you go to the bathroom but before you eat or drink  anything. Write this number down in a weight log/diary. If you gain 3 pounds overnight or 5 pounds in a week, call the office. Take your medicines as prescribed. If you have concerns about your medications, please call us before you stop taking them.  Eat low salt foods--Limit salt (sodium) to 2000 mg per day. This will help prevent your body from holding onto fluid. Read food labels as many processed foods have a lot of sodium, especially canned goods and prepackaged meats. If you would like some assistance choosing low sodium foods, we would be happy to set you up with a nutritionist. Stay as active as you can everyday. Staying active will give you more energy and make your muscles stronger. Start with 5 minutes  at a time and work your way up to 30 minutes a day. Break up your activities--do some in the morning and some in the afternoon. Start with 3 days per week and work your way up to 5 days as you can.  If you have chest pain, feel short of breath, dizzy, or lightheaded, STOP. If you don't feel better after a short rest, call 911. If you do feel better, call the office to let us know you have symptoms with exercise. Limit all fluids for the day to less than 2 liters. Fluid includes all drinks, coffee, juice, ice chips, soup, jello, and all other liquids.      Signed, Levi Aland, NP  07/06/2022 4:50 PM    [facility]

## 2022-07-06 ENCOUNTER — Ambulatory Visit: Payer: Medicare Other | Attending: Nurse Practitioner | Admitting: Nurse Practitioner

## 2022-07-06 ENCOUNTER — Encounter: Payer: Self-pay | Admitting: Nurse Practitioner

## 2022-07-06 VITALS — BP 126/72 | HR 81 | Ht 71.0 in | Wt 179.0 lb

## 2022-07-06 DIAGNOSIS — R9439 Abnormal result of other cardiovascular function study: Secondary | ICD-10-CM | POA: Diagnosis not present

## 2022-07-06 DIAGNOSIS — I2510 Atherosclerotic heart disease of native coronary artery without angina pectoris: Secondary | ICD-10-CM | POA: Diagnosis not present

## 2022-07-06 DIAGNOSIS — E273 Drug-induced adrenocortical insufficiency: Secondary | ICD-10-CM | POA: Diagnosis not present

## 2022-07-06 DIAGNOSIS — I493 Ventricular premature depolarization: Secondary | ICD-10-CM | POA: Diagnosis not present

## 2022-07-06 DIAGNOSIS — I340 Nonrheumatic mitral (valve) insufficiency: Secondary | ICD-10-CM | POA: Diagnosis not present

## 2022-07-06 DIAGNOSIS — I5043 Acute on chronic combined systolic (congestive) and diastolic (congestive) heart failure: Secondary | ICD-10-CM | POA: Diagnosis not present

## 2022-07-06 MED ORDER — ENTRESTO 24-26 MG PO TABS: 1.0000 | ORAL_TABLET | Freq: Two times a day (BID) | ORAL | 6 refills | Status: DC

## 2022-07-06 MED ORDER — CARVEDILOL 3.125 MG PO TABS: 3.1250 mg | ORAL_TABLET | Freq: Two times a day (BID) | ORAL | 6 refills | Status: DC

## 2022-07-06 NOTE — Patient Instructions (Signed)
Medication Instructions:   START Entresto one (1) tablet by mouth ( 24-26 mg) twice daily,.  START  Carvedilol one (1) tablet by mouth ( 3.125 mg) twice daily.    *If you need a refill on your cardiac medications before your next appointment, please call your pharmacy*   Lab Work:  Your physician recommends that you return for lab work on Tuesday, June 11. You can come in on the day of your appointment anytime between 7:30-4:30.    If you have labs (blood work) drawn today and your tests are completely normal, you will receive your results only by: MyChart Message (if you have MyChart) OR A paper copy in the mail If you have any lab test that is abnormal or we need to change your treatment, we will call you to review the results.   Testing/Procedures:  None ordered.   Follow-Up: At [facility], you and your health needs are our priority.  As part of our continuing mission to provide you with exceptional heart care, we have created designated Provider Care Teams.  These Care Teams include your primary Cardiologist (physician) and Advanced Practice Providers (APPs -  Physician Assistants and Nurse Practitioners) who all work together to provide you with the care you need, when you need it.  We recommend signing up for the patient portal called "MyChart".  Sign up information is provided on this After Visit Summary.  MyChart is used to connect with patients for Virtual Visits (Telemedicine).  Patients are able to view lab/test results, encounter notes, upcoming appointments, etc.  Non-urgent messages can be sent to your provider as well.   To learn more about what you can do with MyChart, go to [email or web address].    Your next appointment:   3 week(s)  Provider:   Eligha Bridegroom, NP         Other Instructions  Heart Failure, Diagnosis  Heart failure means that your heart is not able to pump blood in the right way. This makes it hard for your body to work well.  Heart failure is usually a long-term (chronic) condition. You must take good care of yourself and follow your treatment plan from your doctor. Different stages of heart failure have different treatment plans. The stages are: Stage A: At risk for heart failure. Stage B: Pre-heart failure. Stage C: Symptomatic heart failure. Stage D: Advanced heart failure. What are the causes? High blood pressure. Buildup of cholesterol and fat in the arteries. Heart attack. This injures the heart muscle. Heart valves that do not open and close properly. Damage of the heart muscle. This is also called cardiomyopathy. Infection of the heart muscle. This is also called myocarditis. Lung disease. What increases the risk? Getting older. The risk of heart failure goes up as a person ages. Being overweight. Using tobacco or nicotine products. Abusing alcohol or drugs. Having taken medicines that can damage the heart. Having any of these conditions: Diabetes. Abnormal heart rhythms. Thyroid problems. Low blood counts (anemia). Having a family history of heart failure. What are the signs or symptoms? Shortness of breath. Coughing. Swelling of the feet, ankles, legs, or belly. Losing or gaining weight for no reason. Trouble breathing. Waking from sleep because of the need to sit up and get more air. Fast heartbeat. Other symptoms may include: Being very tired. Feeling dizzy, or feeling like you may pass out (faint). Having no desire to eat. Feeling like you may vomit (nauseous). Peeing (urinating) more at night. Feeling confused. How is  this treated? This condition may be treated with: Medicines. These can be given to treat blood pressure and to make the heart muscles stronger. Changes in your daily life. These may include: Eating a healthy diet. Staying at a healthy body weight. Quitting tobacco, alcohol, and drug use. Doing exercises. Participating in a cardiac rehabilitation program. This  program helps you improve your health through exercise, education, and counseling. Surgery. Surgery can be done to open blocked valves or to put devices in the heart, such as pacemakers. A donor heart (heart transplant). You will receive a healthy heart from a donor. Follow these instructions at home: Treat other conditions as told by your doctor. These may include high blood pressure, diabetes, thyroid disease, or abnormal heart rhythms. Learn as much as you can about heart failure. Get support as you need it. Keep all follow-up visits. Where to find more information American Heart Association: www.heart.org Centers for Disease Control and Prevention: FootballExhibition.com.br [employer] on Aging: https://walker.com/ Summary Heart failure means that your heart is not able to pump blood in the right way. This condition is often caused by high blood pressure, heart attack, or damage of the heart muscle. Symptoms of this condition include shortness of breath and swelling of the feet, ankles, legs, or belly. You may also feel very tired or feel like you may vomit. You may be treated with medicines, surgery, or changes in your daily life. Treat other health conditions as told by your doctor. This information is not intended to replace advice given to you by your health care provider. Make sure you discuss any questions you have with your health care provider. Document Revised: 07/18/2020 Document Reviewed: 08/12/2019 Elsevier Patient Education  2024 Elsevier Inc.  Heart Failure Education: Weigh yourself EVERY morning after you go to the bathroom but before you eat or drink anything. Write this number down in a weight log/diary. If you gain 3 pounds overnight or 5 pounds in a week, call the office. Take your medicines as prescribed. If you have concerns about your medications, please call us before you stop taking them.  Eat low salt foods--Limit salt (sodium) to 2000 mg per day. This will help prevent  your body from holding onto fluid. Read food labels as many processed foods have a lot of sodium, especially canned goods and prepackaged meats. If you would like some assistance choosing low sodium foods, we would be happy to set you up with a nutritionist. Stay as active as you can everyday. Staying active will give you more energy and make your muscles stronger. Start with 5 minutes at a time and work your way up to 30 minutes a day. Break up your activities--do some in the morning and some in the afternoon. Start with 3 days per week and work your way up to 5 days as you can.  If you have chest pain, feel short of breath, dizzy, or lightheaded, STOP. If you don't feel better after a short rest, call 911. If you do feel better, call the office to let us know you have symptoms with exercise. Limit all fluids for the day to less than 2 liters. Fluid includes all drinks, coffee, juice, ice chips, soup, jello, and all other liquids.

## 2022-07-07 ENCOUNTER — Other Ambulatory Visit: Payer: Self-pay | Admitting: *Deleted

## 2022-07-07 DIAGNOSIS — I5043 Acute on chronic combined systolic (congestive) and diastolic (congestive) heart failure: Secondary | ICD-10-CM

## 2022-07-07 DIAGNOSIS — R9439 Abnormal result of other cardiovascular function study: Secondary | ICD-10-CM

## 2022-07-07 DIAGNOSIS — I2510 Atherosclerotic heart disease of native coronary artery without angina pectoris: Secondary | ICD-10-CM

## 2022-07-09 ENCOUNTER — Ambulatory Visit (INDEPENDENT_AMBULATORY_CARE_PROVIDER_SITE_OTHER): Payer: Medicare Other

## 2022-07-09 VITALS — Ht 71.0 in | Wt 174.0 lb

## 2022-07-09 DIAGNOSIS — Z0000 Encounter for general adult medical examination without abnormal findings: Secondary | ICD-10-CM | POA: Diagnosis not present

## 2022-07-09 NOTE — Progress Notes (Signed)
I connected with  Maxwell Fox on 07/09/22 by a audio enabled telemedicine application and verified that I am speaking with the correct person using two identifiers.  Patient Location: Home  Provider Location: Office/Clinic  I discussed the limitations of evaluation and management by telemedicine. The patient expressed understanding and agreed to proceed. Subjective:   Maxwell Fox is a 77 y.o. male who presents for Medicare Annual/Subsequent preventive examination.  Patient Medicare AWV questionnaire was completed by the patient on 07/08/2022; I have confirmed that all information answered by patient is correct and no changes since this date.    Review of Systems     Cardiac Risk Factors include: advanced age (>74men, >104 women);male gender     Objective:    Today's Vitals   07/09/22 1230  Weight: 174 lb (78.9 kg)  Height: 5\' 11"  (1.803 m)   Body mass index is 24.27 kg/m.     07/09/2022   12:33 PM 05/16/2022   11:07 PM 12/01/2021    4:21 PM 12/10/2020    9:40 AM 03/27/2020    3:41 PM 03/03/2020    6:00 PM 03/03/2020   11:09 AM  Advanced Directives  Does Patient Have a Medical Advance Directive? No No No No No No No  Would patient like information on creating a medical advance directive?   No - Patient declined No - Patient declined No - Patient declined Yes (Inpatient - patient requests chaplain consult to create a medical advance directive) No - Patient declined    Current Medications (verified) Outpatient Encounter Medications as of 07/09/2022  Medication Sig   acetaminophen (TYLENOL) 325 MG tablet Take 2 tablets (650 mg total) by mouth every 6 (six) hours as needed for moderate pain.   calcium carbonate (OS-CAL - DOSED IN MG OF ELEMENTAL CALCIUM) 1250 (500 Ca) MG tablet Take 1 tablet by mouth daily with supper.   carvedilol (COREG) 3.125 MG tablet Take 1 tablet (3.125 mg total) by mouth 2 (two) times daily with a meal.   Cholecalciferol (VITAMIN D) 2000 UNITS tablet  Take 1 tablet (2,000 Units total) by mouth daily.   clonazePAM (KLONOPIN) 1 MG tablet TAKE 1 TABLET BY MOUTH TWICE DAILY AS NEEDED FOR ANXIETY   DULoxetine (CYMBALTA) 30 MG capsule Take 1 capsule (30 mg total) by mouth daily.   finasteride (PROSCAR) 5 MG tablet Take 1 tablet (5 mg total) by mouth daily.   levothyroxine (SYNTHROID) 88 MCG tablet TAKE 1 TABLET(88 MCG) BY MOUTH DAILY BEFORE AND BREAKFAST   Multiple Vitamin (MULTIVITAMIN WITH MINERALS) TABS tablet Take 1 tablet by mouth daily.   omeprazole (PRILOSEC) 40 MG capsule Take 1 capsule (40 mg total) by mouth daily.   ondansetron (ZOFRAN) 4 MG tablet TAKE 1 TABLET(4 MG) BY MOUTH EVERY 8 HOURS AS NEEDED FOR NAUSEA OR VOMITING   oxyCODONE (ROXICODONE) 5 MG immediate release tablet Take 1 tablet (5 mg total) by mouth every 5 (five) hours as needed for severe pain (chronic back pain).   predniSONE (DELTASONE) 5 MG tablet TAKE 1 TABLET BY MOUTH EVERY DAY WITH BREAKFAST   sacubitril-valsartan (ENTRESTO) 24-26 MG Take 1 tablet by mouth 2 (two) times daily.   sucralfate (CARAFATE) 1 GM/10ML suspension Take 1 g by mouth 4 (four) times daily -  with meals and at bedtime.   tamsulosin (FLOMAX) 0.4 MG CAPS capsule TAKE 1 CAPSULE BY MOUTH EVERY DAY AFTER SUPPER   No facility-administered encounter medications on file as of 07/09/2022.    Allergies (verified) Patient has no  known allergies.   History: Past Medical History:  Diagnosis Date   B12 nutritional deficiency    malabsorbtion   Blood transfusion without reported diagnosis 2022   BPH (benign prostatic hypertrophy)    Brain cancer (HCC) 2017   removed "golf ball sized mass"   Depression    on meds   Diverticulosis    GERD (gastroesophageal reflux disease)    on meds   GI bleeding 02/2020   Hypertension    Iron deficiency anemia    malabsorption related (s/p gastric bypass)   Melanoma (HCC)    Obesity    s/p gastric bypass 2000, start 355#   Pancreatic cyst    Pulmonary nodules     Thyroid disease    on meds   Past Surgical History:  Procedure Laterality Date   CHOLECYSTECTOMY  2000   COLONOSCOPY WITH PROPOFOL N/A 03/05/2020   Procedure: COLONOSCOPY WITH PROPOFOL;  Surgeon: Lemar Lofty., MD;  Location: [facility] ENDOSCOPY;  Service: Gastroenterology;  Laterality: N/A;   CRANIOTOMY FOR TUMOR  12/11/2015   metastatic melanoma   ENTEROSCOPY N/A 03/30/2020   Procedure: ENTEROSCOPY;  Surgeon: Hilarie Fredrickson, MD;  Location: [facility] ENDOSCOPY;  Service: Endoscopy;  Laterality: N/A;   ESOPHAGOGASTRODUODENOSCOPY N/A 03/30/2020   Procedure: ESOPHAGOGASTRODUODENOSCOPY (EGD);  Surgeon: Hilarie Fredrickson, MD;  Location: [facility] ENDOSCOPY;  Service: Endoscopy;  Laterality: N/A;   ESOPHAGOGASTRODUODENOSCOPY (EGD) WITH PROPOFOL N/A 02/14/2020   Procedure: ESOPHAGOGASTRODUODENOSCOPY (EGD) WITH PROPOFOL;  Surgeon: Shellia Cleverly, DO;  Location: [facility] ENDOSCOPY;  Service: Endoscopy;  Laterality: N/A;   ESOPHAGOGASTRODUODENOSCOPY (EGD) WITH PROPOFOL N/A 02/26/2020   Procedure: ESOPHAGOGASTRODUODENOSCOPY (EGD) WITH PROPOFOL;  Surgeon: Hilarie Fredrickson, MD;  Location: [facility] ENDOSCOPY;  Service: Endoscopy;  Laterality: N/A;   ESOPHAGOGASTRODUODENOSCOPY (EGD) WITH PROPOFOL N/A 03/03/2020   Procedure: ESOPHAGOGASTRODUODENOSCOPY (EGD) WITH PROPOFOL;  Surgeon: Jeani Hawking, MD;  Location: [facility] ENDOSCOPY;  Service: Endoscopy;  Laterality: N/A;   ESOPHAGOGASTRODUODENOSCOPY (EGD) WITH PROPOFOL N/A 03/28/2020   Procedure: ESOPHAGOGASTRODUODENOSCOPY (EGD) WITH PROPOFOL;  Surgeon: Hilarie Fredrickson, MD;  Location: [facility] ENDOSCOPY;  Service: Endoscopy;  Laterality: N/A;   FOREIGN BODY REMOVAL  03/30/2020   Procedure: FOREIGN BODY REMOVAL;  Surgeon: Hilarie Fredrickson, MD;  Location: [facility] ENDOSCOPY;  Service: Endoscopy;;   GIVENS CAPSULE STUDY N/A 03/28/2020   Procedure: GIVENS CAPSULE STUDY;  Surgeon: Hilarie Fredrickson, MD;  Location: [facility] ENDOSCOPY;  Service: Endoscopy;  Laterality: N/A;   HEMOSTASIS CONTROL  02/26/2020   Procedure: HEMOSTASIS  CONTROL;  Surgeon: Hilarie Fredrickson, MD;  Location: [facility] ENDOSCOPY;  Service: Endoscopy;;   HEMOSTASIS CONTROL  03/30/2020   Procedure: HEMOSTASIS CONTROL;  Surgeon: Hilarie Fredrickson, MD;  Location: [facility] ENDOSCOPY;  Service: Endoscopy;;   HOT HEMOSTASIS N/A 02/26/2020   Procedure: HOT HEMOSTASIS (ARGON PLASMA COAGULATION/BICAP);  Surgeon: Hilarie Fredrickson, MD;  Location: [facility] ENDOSCOPY;  Service: Endoscopy;  Laterality: N/A;   MOHS SURGERY  2012   melanoma, mid back, [place]   ROUX-EN-Y PROCEDURE  2000   TOTAL SHOULDER REPLACEMENT Right 2011   [place]   WISDOM TOOTH EXTRACTION     Family History  Problem Relation Age of Onset   Hypertension Father    Macular degeneration Mother    Melanoma Mother    Melanoma Sister    Stomach cancer Sister 27       duodenal   Breast cancer Sister    Uterine cancer Sister    Melanoma Brother    Colon polyps Neg Hx    Colon cancer Neg  Hx    Esophageal cancer Neg Hx    Rectal cancer Neg Hx    Social History   Socioeconomic History   Marital status: Married    Spouse name: Not on file   Number of children: 2   Years of education: Not on file   Highest education level: Not on file  Occupational History   Occupation: general contractor-retired  Tobacco Use   Smoking status: Former    Types: Cigarettes    Quit date: 02/03/1963    Years since quitting: 59.4   Smokeless tobacco: Never  Vaping Use   Vaping Use: Never used  Substance and Sexual Activity   Alcohol use: Not Currently    Comment: wine 2x/week   Drug use: Never   Sexual activity: Not on file  Other Topics Concern   Not on file  Social History Narrative   Lives with wife   Retired - Geologist, 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 [employer] of Corporate investment banker Strain: Low Risk  (07/08/2022)   Overall Financial Resource Strain (CARDIA)    Difficulty of Paying Living Expenses: Not hard at all  Food Insecurity: No Food Insecurity (07/08/2022)   Hunger Vital  Sign    Worried About Running Out of Food in the Last Year: Never true    Ran Out of Food in the Last Year: Never true  Transportation Needs: No Transportation Needs (07/08/2022)   PRAPARE - Administrator, Civil Service (Medical): No    Lack of Transportation (Non-Medical): No  Physical Activity: Sufficiently Active (07/08/2022)   Exercise Vital Sign    Days of Exercise per Week: 5 days    Minutes of Exercise per Session: 30 min  Stress: No Stress Concern Present (07/08/2022)   [employer] of Occupational Health - Occupational Stress Questionnaire    Feeling of Stress : Not at all  Social Connections: Unknown (07/08/2022)   Social Connection and Isolation Panel [NHANES]    Frequency of Communication with Friends and Family: Twice a week    Frequency of Social Gatherings with Friends and Family: Twice a week    Attends Religious Services: Not on Marketing executive or Organizations: No    Attends Banker Meetings: Never    Marital Status: Married    Tobacco Counseling Counseling given: Not Answered   Clinical Intake:  Pre-visit preparation completed: Yes  Pain : No/denies pain     Nutritional Status: BMI of 19-24  Normal Nutritional Risks: None Diabetes: No  How often do you need to have someone help you when you read instructions, pamphlets, or other written materials from your doctor or pharmacy?: 1 - Never  Diabetic? no  Interpreter Needed?: No  Information entered by :: NAllen LPN   Activities of Daily Living    07/08/2022    7:11 AM  In your present state of health, do you have any difficulty performing the following activities:  Hearing? 0  Vision? 0  Difficulty concentrating or making decisions? 0  Walking or climbing stairs? 0  Dressing or bathing? 0  Doing errands, shopping? 0  Preparing Food and eating ? N  Using the Toilet? N  In the past six months, have you accidently leaked urine? N  Do you have problems with  loss of bowel control? N  Managing your Medications? N  Managing your Finances? N  Housekeeping or managing your Housekeeping? N  Patient Care Team: Pincus Sanes, MD as PCP - General (Internal Medicine) [name], MD as PCP - Cardiology (Cardiology) Maris Berger, MD as Consulting Physician (Ophthalmology)  Indicate any recent Medical Services you may have received from other than [facility] providers in the past year (date may be approximate).     Assessment:   This is a routine wellness examination for Maxwell Fox.  Hearing/Vision screen Vision Screening - Comments:: Regular eye exams, Dr. Charlotte Sanes  Dietary issues and exercise activities discussed: Current Exercise Habits: Home exercise routine, Type of exercise: walking;strength training/weights, Time (Minutes): 30, Frequency (Times/Week): 5, Weekly Exercise (Minutes/Week): 150   Goals Addressed             This Visit's Progress    Patient Stated       07/09/2022, maintain current health       Depression Screen    07/09/2022   12:34 PM 04/20/2022   10:42 AM 07/18/2021    9:55 AM 07/18/2021    9:54 AM 03/12/2021    9:17 AM 12/10/2020    9:19 AM 07/23/2020    2:43 PM  PHQ 2/9 Scores  PHQ - 2 Score 0 0 0 0 0 0 0  PHQ- 9 Score  0 0        Fall Risk    07/08/2022    7:11 AM 04/20/2022   10:40 AM 01/14/2022   10:34 AM 10/14/2021    9:59 AM 07/18/2021    9:54 AM  Fall Risk   Falls in the past year? 0 0 0 0 1  Number falls in past yr: 0 0 0 0 1  Injury with Fall? 0 0 0 0 1  Risk for fall due to : Medication side effect No Fall Risks No Fall Risks No Fall Risks History of fall(s)  Follow up Falls prevention discussed;Education provided;Falls evaluation completed Falls evaluation completed Falls evaluation completed Falls evaluation completed Falls prevention discussed    FALL RISK PREVENTION PERTAINING TO THE HOME:  Any stairs in or around the home? Yes  If so, are there any without handrails? No  Home free of  loose throw rugs in walkways, pet beds, electrical cords, etc? Yes  Adequate lighting in your home to reduce risk of falls? Yes   ASSISTIVE DEVICES UTILIZED TO PREVENT FALLS:  Life alert? No  Use of a cane, walker or w/c? No  Grab bars in the bathroom? Yes  Shower chair or bench in shower? Yes  Elevated toilet seat or a handicapped toilet? Yes   TIMED UP AND GO:  Was the test performed? No .       Cognitive Function:        07/09/2022   12:35 PM  6CIT Screen  What Year? 0 points  What month? 0 points  What time? 0 points  Count back from 20 2 points  Months in reverse 4 points  Repeat phrase 2 points  Total Score 8 points    Immunizations Immunization History  Administered Date(s) Administered   Fluad Quad(high Dose 65+) 11/12/2018, 10/08/2020, 11/18/2021   Influenza, High Dose Seasonal PF 11/08/2017   Influenza,inj,Quad PF,6+ Mos 10/26/2013, 11/01/2014   Influenza-Unspecified 10/26/2013, 11/01/2014, 01/02/2017   PFIZER Comirnaty(Gray Top)Covid-19 Tri-Sucrose Vaccine 03/31/2019, 04/28/2019, 11/13/2019, 05/13/2020   PFIZER(Purple Top)SARS-COV-2 Vaccination 03/31/2019, 04/28/2019, 11/13/2019, 05/13/2020   Pfizer Covid-19 Vaccine Bivalent Booster 38yrs & up 12/03/2020   Pneumococcal Conjugate-13 01/10/2018   Pneumococcal Polysaccharide-23 10/26/2013   Tdap 04/15/2022    TDAP status: Up to  date  Flu Vaccine status: Up to date  Pneumococcal vaccine status: Up to date  Covid-19 vaccine status: Completed vaccines  Qualifies for Shingles Vaccine? Yes   Zostavax completed No   Shingrix Completed?: No.    Education has been provided regarding the importance of this vaccine. Patient has been advised to call insurance company to determine out of pocket expense if they have not yet received this vaccine. Advised may also receive vaccine at local pharmacy or Health Dept. Verbalized acceptance and understanding.  Screening Tests Health Maintenance  Topic Date Due    Zoster Vaccines- Shingrix (1 of 2) Never done   COVID-19 Vaccine (10 - 2023-24 season) 10/03/2021   Medicare Annual Wellness (AWV)  12/10/2021   DEXA SCAN  08/23/2022   INFLUENZA VACCINE  09/03/2022   DTaP/Tdap/Td (2 - Td or Tdap) 04/14/2032   Pneumonia Vaccine 62+ Years old  Completed   Hepatitis C Screening  Completed   HPV VACCINES  Aged Out   Colonoscopy  Discontinued    Health Maintenance  Health Maintenance Due  Topic Date Due   Zoster Vaccines- Shingrix (1 of 2) Never done   COVID-19 Vaccine (10 - 2023-24 season) 10/03/2021   Medicare Annual Wellness (AWV)  12/10/2021    Colorectal cancer screening: No longer required.   Lung Cancer Screening: (Low Dose CT Chest recommended if Age 57-80 years, 30 pack-year currently smoking OR have quit w/in 15years.) does not qualify.   Lung Cancer Screening Referral: no  Additional Screening:  Hepatitis C Screening: does qualify; Completed 11/01/2014  Vision Screening: Recommended annual ophthalmology exams for early detection of glaucoma and other disorders of the eye. Is the patient up to date with their annual eye exam?  Yes  Who is the provider or what is the name of the office in which the patient attends annual eye exams? Dr. Charlotte Sanes If pt is not established with a provider, would they like to be referred to a provider to establish care? No .   Dental Screening: Recommended annual dental exams for proper oral hygiene  Community Resource Referral / Chronic Care Management: CRR required this visit?  No   CCM required this visit?  No      Plan:     I have personally reviewed and noted the following in the patient's chart:   Medical and social history Use of alcohol, tobacco or illicit drugs  Current medications and supplements including opioid prescriptions. Patient is not currently taking opioid prescriptions. Functional ability and status Nutritional status Physical activity Advanced directives List of other  physicians Hospitalizations, surgeries, and ER visits in previous 12 months Vitals Screenings to include cognitive, depression, and falls Referrals and appointments  In addition, I have reviewed and discussed with patient certain preventive protocols, quality metrics, and best practice recommendations. A written personalized care plan for preventive services as well as general preventive health recommendations were provided to patient.     Barb Merino, LPN   07/07/7844   Nurse Notes: none  Due to this being a virtual visit, the after visit summary with patients personalized plan was offered to patient via mail or my-chart.  Patient would like to access on my-chart

## 2022-07-09 NOTE — Patient Instructions (Signed)
Maxwell Fox , Thank you for taking time to come for your Medicare Wellness Visit. I appreciate your ongoing commitment to your health goals. Please review the following plan we discussed and let me know if I can assist you in the future.   These are the goals we discussed:  Goals      Patient Stated     07/09/2022, maintain current health        This is a list of the screening recommended for you and due dates:  Health Maintenance  Topic Date Due   Zoster (Shingles) Vaccine (1 of 2) Never done   COVID-19 Vaccine (10 - 2023-24 season) 10/03/2021   DEXA scan (bone density measurement)  08/23/2022   Flu Shot  09/03/2022   Medicare Annual Wellness Visit  07/09/2023   DTaP/Tdap/Td vaccine (2 - Td or Tdap) 04/14/2032   Pneumonia Vaccine  Completed   Hepatitis C Screening  Completed   HPV Vaccine  Aged Out   Colon Cancer Screening  Discontinued    Advanced directives: Advance directive discussed with you today.   Conditions/risks identified: none  Next appointment: Follow up in one year for your annual wellness visit.   Preventive Care 8 Years and Older, Male  Preventive care refers to lifestyle choices and visits with your health care provider that can promote health and wellness. What does preventive care include? A yearly physical exam. This is also called an annual well check. Dental exams once or twice a year. Routine eye exams. Ask your health care provider how often you should have your eyes checked. Personal lifestyle choices, including: Daily care of your teeth and gums. Regular physical activity. Eating a healthy diet. Avoiding tobacco and drug use. Limiting alcohol use. Practicing safe sex. Taking low doses of aspirin every day. Taking vitamin and mineral supplements as recommended by your health care provider. What happens during an annual well check? The services and screenings done by your health care provider during your annual well check will depend on your  age, overall health, lifestyle risk factors, and family history of disease. Counseling  Your health care provider may ask you questions about your: Alcohol use. Tobacco use. Drug use. Emotional well-being. Home and relationship well-being. Sexual activity. Eating habits. History of falls. Memory and ability to understand (cognition). Work and work Astronomer. Screening  You may have the following tests or measurements: Height, weight, and BMI. Blood pressure. Lipid and cholesterol levels. These may be checked every 5 years, or more frequently if you are over 76 years old. Skin check. Lung cancer screening. You may have this screening every year starting at age 53 if you have a 30-pack-year history of smoking and currently smoke or have quit within the past 15 years. Fecal occult blood test (FOBT) of the stool. You may have this test every year starting at age 27. Flexible sigmoidoscopy or colonoscopy. You may have a sigmoidoscopy every 5 years or a colonoscopy every 10 years starting at age 38. Prostate cancer screening. Recommendations will vary depending on your family history and other risks. Hepatitis C blood test. Hepatitis B blood test. Sexually transmitted disease (STD) testing. Diabetes screening. This is done by checking your blood sugar (glucose) after you have not eaten for a while (fasting). You may have this done every 1-3 years. Abdominal aortic aneurysm (AAA) screening. You may need this if you are a current or former smoker. Osteoporosis. You may be screened starting at age 63 if you are at high risk. Talk  with your health care provider about your test results, treatment options, and if necessary, the need for more tests. Vaccines  Your health care provider may recommend certain vaccines, such as: Influenza vaccine. This is recommended every year. Tetanus, diphtheria, and acellular pertussis (Tdap, Td) vaccine. You may need a Td booster every 10 years. Zoster  vaccine. You may need this after age 65. Pneumococcal 13-valent conjugate (PCV13) vaccine. One dose is recommended after age 33. Pneumococcal polysaccharide (PPSV23) vaccine. One dose is recommended after age 86. Talk to your health care provider about which screenings and vaccines you need and how often you need them. This information is not intended to replace advice given to you by your health care provider. Make sure you discuss any questions you have with your health care provider. Document Released: 02/15/2015 Document Revised: 10/09/2015 Document Reviewed: 11/20/2014 Elsevier Interactive Patient Education  2017 [employer].  Fall Prevention in the Home Falls can cause injuries. They can happen to people of all ages. There are many things you can do to make your home safe and to help prevent falls. What can I do on the outside of my home? Regularly fix the edges of walkways and driveways and fix any cracks. Remove anything that might make you trip as you walk through a door, such as a raised step or threshold. Trim any bushes or trees on the path to your home. Use bright outdoor lighting. Clear any walking paths of anything that might make someone trip, such as rocks or tools. Regularly check to see if handrails are loose or broken. Make sure that both sides of any steps have handrails. Any raised decks and porches should have guardrails on the edges. Have any leaves, snow, or ice cleared regularly. Use sand or salt on walking paths during winter. Clean up any spills in your garage right away. This includes oil or grease spills. What can I do in the bathroom? Use night lights. Install grab bars by the toilet and in the tub and shower. Do not use towel bars as grab bars. Use non-skid mats or decals in the tub or shower. If you need to sit down in the shower, use a plastic, non-slip stool. Keep the floor dry. Clean up any water that spills on the floor as soon as it happens. Remove  soap buildup in the tub or shower regularly. Attach bath mats securely with double-sided non-slip rug tape. Do not have throw rugs and other things on the floor that can make you trip. What can I do in the bedroom? Use night lights. Make sure that you have a light by your bed that is easy to reach. Do not use any sheets or blankets that are too big for your bed. They should not hang down onto the floor. Have a firm chair that has side arms. You can use this for support while you get dressed. Do not have throw rugs and other things on the floor that can make you trip. What can I do in the kitchen? Clean up any spills right away. Avoid walking on wet floors. Keep items that you use a lot in easy-to-reach places. If you need to reach something above you, use a strong step stool that has a grab bar. Keep electrical cords out of the way. Do not use floor polish or wax that makes floors slippery. If you must use wax, use non-skid floor wax. Do not have throw rugs and other things on the floor that can make you  trip. What can I do with my stairs? Do not leave any items on the stairs. Make sure that there are handrails on both sides of the stairs and use them. Fix handrails that are broken or loose. Make sure that handrails are as long as the stairways. Check any carpeting to make sure that it is firmly attached to the stairs. Fix any carpet that is loose or worn. Avoid having throw rugs at the top or bottom of the stairs. If you do have throw rugs, attach them to the floor with carpet tape. Make sure that you have a light switch at the top of the stairs and the bottom of the stairs. If you do not have them, ask someone to add them for you. What else can I do to help prevent falls? Wear shoes that: Do not have high heels. Have rubber bottoms. Are comfortable and fit you well. Are closed at the toe. Do not wear sandals. If you use a stepladder: Make sure that it is fully opened. Do not climb a  closed stepladder. Make sure that both sides of the stepladder are locked into place. Ask someone to hold it for you, if possible. Clearly mark and make sure that you can see: Any grab bars or handrails. First and last steps. Where the edge of each step is. Use tools that help you move around (mobility aids) if they are needed. These include: Canes. Walkers. Scooters. Crutches. Turn on the lights when you go into a dark area. Replace any light bulbs as soon as they burn out. Set up your furniture so you have a clear path. Avoid moving your furniture around. If any of your floors are uneven, fix them. If there are any pets around you, be aware of where they are. Review your medicines with your doctor. Some medicines can make you feel dizzy. This can increase your chance of falling. Ask your doctor what other things that you can do to help prevent falls. This information is not intended to replace advice given to you by your health care provider. Make sure you discuss any questions you have with your health care provider. Document Released: 11/15/2008 Document Revised: 06/27/2015 Document Reviewed: 02/23/2014 Elsevier Interactive Patient Education  2017 [employer].

## 2022-07-11 ENCOUNTER — Other Ambulatory Visit: Payer: Self-pay | Admitting: Internal Medicine

## 2022-07-13 NOTE — Addendum Note (Signed)
Addended by: Bertram Millard on: 07/13/2022 09:36 AM   Modules accepted: Orders

## 2022-07-14 ENCOUNTER — Ambulatory Visit: Payer: Medicare Other

## 2022-07-16 ENCOUNTER — Encounter: Payer: Self-pay | Admitting: Internal Medicine

## 2022-07-16 MED ORDER — OXYCODONE HCL 5 MG PO TABS: 5.0000 mg | ORAL_TABLET | ORAL | 0 refills | Status: DC | PRN

## 2022-07-17 MED ORDER — VALSARTAN 40 MG PO TABS: 20.0000 mg | ORAL_TABLET | Freq: Every day | ORAL | 11 refills | Status: DC

## 2022-07-17 NOTE — Addendum Note (Signed)
Addended by: Levi Aland on: 07/17/2022 03:56 PM   Modules accepted: Orders

## 2022-07-20 NOTE — Progress Notes (Unsigned)
Subjective:    Patient ID: Maxwell Fox, male    DOB: 10/31/1945, 77 y.o.   MRN: [id number]     HPI Maxwell Fox is here for follow up of his chronic medical problems.  Currently taking oxycodone 5 mg 5 times a day for total of 25 mg/day-prescription refilled 07-19-2022 next refill will decrease to 20 mg daily  Started Cymbalta 30 mg daily 3 months ago for depression and anxiety and hopefully it helps some with pain.  Also on clonazepam  Exercising regularly - helping with pain management   Medications and allergies reviewed with patient and updated if appropriate.  Current Outpatient Medications on File Prior to Visit  Medication Sig Dispense Refill   acetaminophen (TYLENOL) 325 MG tablet Take 2 tablets (650 mg total) by mouth every 6 (six) hours as needed for moderate pain.     calcium carbonate (OS-CAL - DOSED IN MG OF ELEMENTAL CALCIUM) 1250 (500 Ca) MG tablet Take 1 tablet by mouth daily with supper.     Cholecalciferol (VITAMIN D) 2000 UNITS tablet Take 1 tablet (2,000 Units total) by mouth daily. 30 tablet 11   clonazePAM (KLONOPIN) 1 MG tablet TAKE 1 TABLET BY MOUTH TWICE DAILY AS NEEDED FOR ANXIETY 60 tablet 2   DULoxetine (CYMBALTA) 30 MG capsule Take 1 capsule (30 mg total) by mouth daily. 90 capsule 1   finasteride (PROSCAR) 5 MG tablet Take 1 tablet (5 mg total) by mouth daily. 90 tablet 1   levothyroxine (SYNTHROID) 88 MCG tablet TAKE 1 TABLET BY MOUTH EVERY DAY BEFORE BREAKFAST 45 tablet 1   Multiple Vitamin (MULTIVITAMIN WITH MINERALS) TABS tablet Take 1 tablet by mouth daily.     omeprazole (PRILOSEC) 40 MG capsule Take 1 capsule (40 mg total) by mouth daily. 90 capsule 3   ondansetron (ZOFRAN) 4 MG tablet TAKE 1 TABLET(4 MG) BY MOUTH EVERY 8 HOURS AS NEEDED FOR NAUSEA OR VOMITING 90 tablet 1   oxyCODONE (ROXICODONE) 5 MG immediate release tablet Take 1 tablet (5 mg total) by mouth every 5 (five) hours as needed for severe pain (chronic back pain). 150 tablet 0    predniSONE (DELTASONE) 5 MG tablet TAKE 1 TABLET BY MOUTH EVERY DAY WITH BREAKFAST 90 tablet 3   sucralfate (CARAFATE) 1 GM/10ML suspension Take 1 g by mouth 4 (four) times daily -  with meals and at bedtime.     tamsulosin (FLOMAX) 0.4 MG CAPS capsule TAKE 1 CAPSULE BY MOUTH EVERY DAY AFTER SUPPER 90 capsule 1   valsartan (DIOVAN) 40 MG tablet Take 0.5 tablets (20 mg total) by mouth daily. 15 tablet 11   No current facility-administered medications on file prior to visit.     Review of Systems  Constitutional:  Negative for fever.  Respiratory:  Negative for cough, shortness of breath and wheezing.   Cardiovascular:  Negative for chest pain, palpitations and leg swelling.  Genitourinary:  Positive for frequency.  Neurological:  Negative for light-headedness and headaches.       Objective:   Vitals:   07/21/22 1019  BP: 124/62  Pulse: 60  Temp: 97.7 F (36.5 C)  SpO2: 99%   BP Readings from Last 3 Encounters:  07/21/22 124/62  07/06/22 126/72  06/26/22 120/70   Wt Readings from Last 3 Encounters:  07/21/22 180 lb 6.4 oz (81.8 kg)  07/09/22 174 lb (78.9 kg)  07/06/22 179 lb (81.2 kg)   Body mass index is 25.16 kg/m.    Physical Exam Constitutional:  General: He is not in acute distress.    Appearance: Normal appearance. He is not ill-appearing.  HENT:     Head: Normocephalic and atraumatic.  Eyes:     Conjunctiva/sclera: Conjunctivae normal.  Cardiovascular:     Rate and Rhythm: Normal rate and regular rhythm.     Heart sounds: Murmur (2/6 sys) heard.  Pulmonary:     Effort: Pulmonary effort is normal. No respiratory distress.     Breath sounds: Normal breath sounds. No wheezing or rales.  Musculoskeletal:     Right lower leg: No edema.     Left lower leg: No edema.  Skin:    General: Skin is warm and dry.     Findings: No rash.  Neurological:     Mental Status: He is alert. Mental status is at baseline.  Psychiatric:        Mood and Affect: Mood  normal.        Lab Results  Component Value Date   WBC 2.9 (L) 05/20/2022   HGB 13.6 05/20/2022   HCT 42.5 05/20/2022   PLT 179 05/20/2022   GLUCOSE 85 05/20/2022   CHOL 195 01/07/2017   TRIG 152.0 (H) 01/07/2017   HDL 73.20 01/07/2017   LDLCALC 91 01/07/2017   ALT 17 05/16/2022   AST 29 05/16/2022   NA 138 05/20/2022   K 5.2 05/20/2022   CL 106 05/20/2022   CREATININE 1.05 05/20/2022   BUN 13 05/20/2022   CO2 18 (L) 05/20/2022   TSH 0.449 05/16/2022   INR 1.2 02/26/2020   HGBA1C 4.7 (L) 02/14/2020     Assessment & Plan:    See Problem List for Assessment and Plan of chronic medical problems.

## 2022-07-20 NOTE — Patient Instructions (Addendum)
        Medications changes include :   Decrease oxycodone to 20 mg/day.  Do not exceed 4 pills in 1 day.       Return in about 3 months (around 10/21/2022) for follow up.

## 2022-07-20 NOTE — Progress Notes (Deleted)
Cardiology Office Note:    Date:  07/20/2022   ID:  Maxwell Fox, DOB [birth year], MRN [id number]  PCP:  Pincus Sanes, MD   [facility] Providers Cardiologist:  Verne Carrow, MD     Referring MD: Pincus Sanes, MD   Chief Complaint: ***  History of Present Illness:    Maxwell Fox is a very pleasant 77 y.o. male with a hx of sinus bradycardia, mild AAS, BPH, hypertension, HLD, iron deficiency anemia, melanoma with brain metastasis, and obesity s/p gastric bypass.   Had an episode of dizziness July 2020 and HR was in the 30s. He was seen in the ED and had PVCs with bigeminy.  He had an echo and stress test and Dr. Annitta Jersey office.  Echo 08/30/2018 LVEF 50%, G1 DD, mild LVH, mild MR, mild AS. Nuclear stress test 08/24/2018, no ischemia. Metastatic melanoma with treatment at [facility] and is in remission.  He had a tumor removal and radiation.  He has adrenal insufficiency post immunotherapy and is on daily prednisone.  He was admitted to [facility] February 2022 with upper GI bleeding due to AVMs.  Patient was in the ED 05/16/2022 with headache, generalized weakness. MRI showed chronic temporal lobe lesion with mild edema. He was trying to wean himself off oxycodone. Hs Troponins 73 >> 69.  EKG revealed TWI in lead III. He wanted to go home, but daughter was saying he had worsening DOE.  Could only take 10 steps and was out of breath.  SOB with deep breathing or talking.  Came in in a wheelchair due to weakness.  Never used a cane or walker before.  Wife was at home with flulike symptoms. He had diarrhea but it was improving. WBC 3.1 in the ED. He denied chest pain.  Seen in clinic by Jacolyn Reedy, PA on 05/20/22 at which time he continued to have DOE and shortness of breath at rest and with talking. CT ordered to rule out PE.  He was diagnosed with COVID-19 after testing at home due to symptoms during clinic visit. Echo 06/19/22 revealed LVEF 30-35%, G1DD, normal RV function and size, mild  MR, mild calcification of aortic valve with no aortic stenosis, borderline dilatation of ascending aorta, measuring 38 mm. Nuclear stress test revealed findings consistent with infarction with peri-infarct ischemia, intermediate risk.  Medium defect with severe reduction in uptake present in the apical to basal inferior and inferior septal location that is partially reversible, abnormal wall motion in this area, consistent with infarction and peri-infarct ischemia. EF previously 50% in 2020. Per Dr. Clifton James, "I think we should get him back into the office and see how he is feeling. His nuclear stress test looks overall unchanged from before but the EF on the echo is lower. His other echo was in Dr. Annitta Jersey office so I can't compare images. If we see him back and he is still having dyspnea after recovering from Covid, I think a cath is reasonable."  Seen in clinic by me on 07/06/22. Reported he is feeling great. Is having no further symptoms of shortness of breath. Is walking 2 miles daily with no dyspnea, chest discomfort or activity intolerance. Walking pace is consistent. No palpitations or chest pain. No orthopnea, edema, PND, presyncope, syncope. Believes his symptoms were secondary to Covid infection because symptoms have resolved. Has mild bilateral lower extremity edema. He denies orthopnea, PND, presyncope, syncope. On oxycodone reduction program (2/2 prior hip fracture and cancer). Is having frequent PVCs on EKG. BP  has been well controlled at home. We attempted to start carvedilol 3.125 mg BID and Entresto 24-26 mg BID for frequent PVCs and CHF. He called a few days later to report fatigue and increased SOB. BP as low as 90s over 60s. He felt better within 2 days of holding the carvedilol and Entresto. We started him on low dose valsartan with plan to up titrate more slowly if tolerated.   Today, he is here   Labs  Past Medical History:  Diagnosis Date   B12 nutritional deficiency     malabsorbtion   Blood transfusion without reported diagnosis 2022   BPH (benign prostatic hypertrophy)    Brain cancer (HCC) 2017   removed "golf ball sized mass"   Depression    on meds   Diverticulosis    GERD (gastroesophageal reflux disease)    on meds   GI bleeding 02/2020   Hypertension    Iron deficiency anemia    malabsorption related (s/p gastric bypass)   Melanoma (HCC)    Obesity    s/p gastric bypass 2000, start 355#   Pancreatic cyst    Pulmonary nodules    Thyroid disease    on meds    Past Surgical History:  Procedure Laterality Date   CHOLECYSTECTOMY  2000   COLONOSCOPY WITH PROPOFOL N/A 03/05/2020   Procedure: COLONOSCOPY WITH PROPOFOL;  Surgeon: Lemar Lofty., MD;  Location: [facility] ENDOSCOPY;  Service: Gastroenterology;  Laterality: N/A;   CRANIOTOMY FOR TUMOR  12/11/2015   metastatic melanoma   ENTEROSCOPY N/A 03/30/2020   Procedure: ENTEROSCOPY;  Surgeon: Hilarie Fredrickson, MD;  Location: [facility] ENDOSCOPY;  Service: Endoscopy;  Laterality: N/A;   ESOPHAGOGASTRODUODENOSCOPY N/A 03/30/2020   Procedure: ESOPHAGOGASTRODUODENOSCOPY (EGD);  Surgeon: Hilarie Fredrickson, MD;  Location: [facility] ENDOSCOPY;  Service: Endoscopy;  Laterality: N/A;   ESOPHAGOGASTRODUODENOSCOPY (EGD) WITH PROPOFOL N/A 02/14/2020   Procedure: ESOPHAGOGASTRODUODENOSCOPY (EGD) WITH PROPOFOL;  Surgeon: Shellia Cleverly, DO;  Location: [facility] ENDOSCOPY;  Service: Endoscopy;  Laterality: N/A;   ESOPHAGOGASTRODUODENOSCOPY (EGD) WITH PROPOFOL N/A 02/26/2020   Procedure: ESOPHAGOGASTRODUODENOSCOPY (EGD) WITH PROPOFOL;  Surgeon: Hilarie Fredrickson, MD;  Location: [facility] ENDOSCOPY;  Service: Endoscopy;  Laterality: N/A;   ESOPHAGOGASTRODUODENOSCOPY (EGD) WITH PROPOFOL N/A 03/03/2020   Procedure: ESOPHAGOGASTRODUODENOSCOPY (EGD) WITH PROPOFOL;  Surgeon: Jeani Hawking, MD;  Location: [facility] ENDOSCOPY;  Service: Endoscopy;  Laterality: N/A;   ESOPHAGOGASTRODUODENOSCOPY (EGD) WITH PROPOFOL N/A 03/28/2020   Procedure:  ESOPHAGOGASTRODUODENOSCOPY (EGD) WITH PROPOFOL;  Surgeon: Hilarie Fredrickson, MD;  Location: [facility] ENDOSCOPY;  Service: Endoscopy;  Laterality: N/A;   FOREIGN BODY REMOVAL  03/30/2020   Procedure: FOREIGN BODY REMOVAL;  Surgeon: Hilarie Fredrickson, MD;  Location: [facility] ENDOSCOPY;  Service: Endoscopy;;   GIVENS CAPSULE STUDY N/A 03/28/2020   Procedure: GIVENS CAPSULE STUDY;  Surgeon: Hilarie Fredrickson, MD;  Location: [facility] ENDOSCOPY;  Service: Endoscopy;  Laterality: N/A;   HEMOSTASIS CONTROL  02/26/2020   Procedure: HEMOSTASIS CONTROL;  Surgeon: Hilarie Fredrickson, MD;  Location: [facility] ENDOSCOPY;  Service: Endoscopy;;   HEMOSTASIS CONTROL  03/30/2020   Procedure: HEMOSTASIS CONTROL;  Surgeon: Hilarie Fredrickson, MD;  Location: [facility] ENDOSCOPY;  Service: Endoscopy;;   HOT HEMOSTASIS N/A 02/26/2020   Procedure: HOT HEMOSTASIS (ARGON PLASMA COAGULATION/BICAP);  Surgeon: Hilarie Fredrickson, MD;  Location: [facility] ENDOSCOPY;  Service: Endoscopy;  Laterality: N/A;   MOHS SURGERY  2012   melanoma, mid back, [place]   ROUX-EN-Y PROCEDURE  2000   TOTAL SHOULDER REPLACEMENT Right 2011   [place]   WISDOM  TOOTH EXTRACTION      Current Medications: No outpatient medications have been marked as taking for the 07/27/22 encounter (Appointment) with Lissa Hoard, Zachary George, NP.     Allergies:   Patient has no known allergies.   Social History   Socioeconomic History   Marital status: Married    Spouse name: Not on file   Number of children: 2   Years of education: Not on file   Highest education level: Not on file  Occupational History   Occupation: general contractor-retired  Tobacco Use   Smoking status: Former    Types: Cigarettes    Quit date: 02/03/1963    Years since quitting: 59.4   Smokeless tobacco: Never  Vaping Use   Vaping Use: Never used  Substance and Sexual Activity   Alcohol use: Not Currently    Comment: wine 2x/week   Drug use: Never   Sexual activity: Not on file  Other Topics Concern   Not on file  Social History  Narrative   Lives with wife   Retired - Geologist, 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 [employer] of Corporate investment banker Strain: Low Risk  (07/08/2022)   Overall Financial Resource Strain (CARDIA)    Difficulty of Paying Living Expenses: Not hard at all  Food Insecurity: No Food Insecurity (07/08/2022)   Hunger Vital Sign    Worried About Running Out of Food in the Last Year: Never true    Ran Out of Food in the Last Year: Never true  Transportation Needs: No Transportation Needs (07/08/2022)   PRAPARE - Administrator, Civil Service (Medical): No    Lack of Transportation (Non-Medical): No  Physical Activity: Sufficiently Active (07/08/2022)   Exercise Vital Sign    Days of Exercise per Week: 5 days    Minutes of Exercise per Session: 30 min  Stress: No Stress Concern Present (07/08/2022)   [employer] of Occupational Health - Occupational Stress Questionnaire    Feeling of Stress : Not at all  Social Connections: Unknown (07/08/2022)   Social Connection and Isolation Panel [NHANES]    Frequency of Communication with Friends and Family: Twice a week    Frequency of Social Gatherings with Friends and Family: Twice a week    Attends Religious Services: Not on Marketing executive or Organizations: No    Attends Banker Meetings: Never    Marital Status: Married     Family History: The patient's family history includes Breast cancer in his sister; Hypertension in his father; Macular degeneration in his mother; Melanoma in his brother, mother, and sister; Stomach cancer (age of onset: 81) in his sister; Uterine cancer in his sister. There is no history of Colon polyps, Colon cancer, Esophageal cancer, or Rectal cancer.  ROS:   Please see the history of present illness.   All other systems reviewed and are negative.  Labs/Other Studies Reviewed:    The following studies were reviewed today:  Lexiscan Myoview 06/19/22    Findings are consistent with infarction with peri-infarct ischemia. The study is intermediate risk.   No ST deviation was noted.   LV perfusion is abnormal. Defect 1: There is a medium defect with severe reduction in uptake present in the apical to basal inferior and inferoseptal location(s) that is partially reversible. There is abnormal wall motion in the defect area. Consistent with infarction and peri-infarct ischemia.   Left ventricular function  is abnormal. Global function is moderately reduced. Nuclear stress EF: 36 %. The left ventricular ejection fraction is moderately decreased (30-44%). End diastolic cavity size is mildly enlarged. End systolic cavity size is normal.   Prior study available for comparison from 08/24/2018. Inferoseptal infarct, LVEF 44%   Medium size, severe intensity mostly fixed (SDS 3) defect of the basal to apical inferior wall and mid-apical inferoseptal walls, consistent with infarct and minimal peri-infarct ischemia. LVEF 36% with inferior and inferoseptal akinesis. This is an intermediate risk study. Compared to a prior study in 2020, the fixed defect, which is suspected to be a scar, is unchanged. LVEF is now reported to be lower - echo correlation is recommended.   Echo 06/19/22 1. Left ventricular ejection fraction, by estimation, is 30 to 35%. The  left ventricle has moderately decreased function. The left ventricle has  no regional wall motion abnormalities. Left ventricular diastolic  parameters are consistent with Grade I  diastolic dysfunction (impaired relaxation).   2. Right ventricular systolic function is normal. The right ventricular  size is normal.   3. The mitral valve is normal in structure. Mild mitral valve  regurgitation. No evidence of mitral stenosis.   4. The aortic valve is normal in structure. There is mild calcification  of the aortic valve. There is mild thickening of the aortic valve. Aortic  valve regurgitation is not visualized. No  aortic stenosis is present.   5. Aortic dilatation noted. There is borderline dilatation of the  ascending aorta, measuring 38 mm.   6. The inferior vena cava is normal in size with greater than 50%  respiratory variability, suggesting right atrial pressure of 3 mmHg.   CTA Chest PE 05/20/22 IMPRESSION: 1. Technically adequate exam showing no acute pulmonary embolus. 2. Coronary artery calcifications. 3. Prior cholecystectomy and gastric bypass.   Recent Labs: 05/16/2022: ALT 17; TSH 0.449 05/20/2022: BUN 13; Creatinine, Ser 1.05; Hemoglobin 13.6; Platelets 179; Potassium 5.2; Sodium 138  Recent Lipid Panel    Component Value Date/Time   CHOL 195 01/07/2017 1034   TRIG 152.0 (H) 01/07/2017 1034   HDL 73.20 01/07/2017 1034   CHOLHDL 3 01/07/2017 1034   VLDL 30.4 01/07/2017 1034   LDLCALC 91 01/07/2017 1034     Risk Assessment/Calculations:           Physical Exam:    VS:  There were no vitals taken for this visit.    Wt Readings from Last 3 Encounters:  07/09/22 174 lb (78.9 kg)  07/06/22 179 lb (81.2 kg)  06/26/22 181 lb 6.4 oz (82.3 kg)     GEN:  Well nourished, well developed in no acute distress HEENT: Normal NECK: No JVD; No carotid bruits CARDIAC: Irregular RR, no murmurs, rubs, gallops RESPIRATORY:  Clear to auscultation without rales, wheezing or rhonchi  ABDOMEN: Soft, non-tender, non-distended MUSCULOSKELETAL:  No edema; No deformity. 2+ pedal pulses, equal bilaterally SKIN: Warm and dry NEUROLOGIC:  Alert and oriented x 3 PSYCHIATRIC:  Normal affect   EKG:  EKG is ordered today.  The ekg ordered today demonstrates sinus rhythm at 81 bpm with frequent PVCs  No BP recorded.  {Refresh Note OR Click here to enter BP  :1}***    Diagnoses:    No diagnosis found.  Assessment and Plan:     Acute on chronic combined  CHF: Echo 06/19/2022 revealed newly reduced LVEF 30 to 35%, G1 DD. EF on lexiscan myoview 06/19/22 reports LVEF 36%. Previous echo with EF  50% 2020.  He reported DOE and shortness of breath at office visit on 05/20/2022 with recent ED visit for same symptoms. Was diagnosed with COVID infection shortly after. Reports symptoms have dissipated and he no longer has any symptoms. Has resumed walking 2 miles daily without shortness of breath, dyspnea, chest pain or palpitations. Lengthy discussion about potential causes of cardiomyopathy. He suspects is a result of immunotherapy for melanoma. Appears euvolemic on exam. Weight is stable. 2 L fluid restriction and low sodium diet encouraged. We will initiate GDMT including Entresto 24-26 mg BID and carvedilol 3.125 mg twice daily. Will get BMP in 2 weeks and see him back in 4 weeks. Information provided.   Frequent PVCs: EKG with frequent PVCs in trigeminy pattern with HR 81 bpm. He is asymptomatic.  Advised these could be contributing to reduced EF. Will start carvedilol 3.125 mg twice daily. Consider cardiac monitor to evaluate PVC burden.   Abnormal stress test: [facility] 06/19/2022 that revealed medium defect in apical to basal inferior and inferior septal locations that is partially reversible with abnormal wall motion in the defect area consistent with infarction and peri-infarct ischemia. Reduced LVEF. History of coronary artery calcifications noted on chest CT 05/20/2022. He is asymptomatic. Previously noted DOE and shortness of breath that resolved after recovering from COVID infection. GDMT for CHF as noted above. Advised him to notify us if he develops symptoms concerning for angina.   Mitral regurgitation: Mild MR on echo 06/19/2022. Continue to monitor clinically for now.   Coronary artery calcification on CT: We need to update lipid panel and check lipoprotein a. Will get these at his lab appointment for medication management in 7-10 days.   Adrenal insufficiency: On chronic prednisone 5 mg daily. Management per endocrinology.      Disposition: ***  Medication Adjustments/Labs  and Tests Ordered: Current medicines are reviewed at length with the patient today.  Concerns regarding medicines are outlined above.  No orders of the defined types were placed in this encounter.  No orders of the defined types were placed in this encounter.   There are no Patient Instructions on file for this visit.   Signed, Levi Aland, NP  07/20/2022 5:28 AM    [facility]

## 2022-07-21 ENCOUNTER — Ambulatory Visit (INDEPENDENT_AMBULATORY_CARE_PROVIDER_SITE_OTHER): Payer: Medicare Other | Admitting: Internal Medicine

## 2022-07-21 VITALS — BP 124/62 | HR 60 | Temp 97.7°F | Ht 71.0 in | Wt 180.4 lb

## 2022-07-21 DIAGNOSIS — N401 Enlarged prostate with lower urinary tract symptoms: Secondary | ICD-10-CM

## 2022-07-21 DIAGNOSIS — R350 Frequency of micturition: Secondary | ICD-10-CM

## 2022-07-21 DIAGNOSIS — G894 Chronic pain syndrome: Secondary | ICD-10-CM | POA: Diagnosis not present

## 2022-07-21 DIAGNOSIS — F32A Depression, unspecified: Secondary | ICD-10-CM | POA: Diagnosis not present

## 2022-07-21 DIAGNOSIS — F419 Anxiety disorder, unspecified: Secondary | ICD-10-CM

## 2022-07-21 MED ORDER — OXYCODONE HCL 5 MG PO TABS: 5.0000 mg | ORAL_TABLET | Freq: Four times a day (QID) | ORAL | 0 refills | Status: DC | PRN

## 2022-07-21 NOTE — Assessment & Plan Note (Addendum)
Chronic Not emptying bladder completely, frequent urination- improved with proscar Continue Flomax 0.4 mg daily, proscar 5 mg daily

## 2022-07-21 NOTE — Assessment & Plan Note (Addendum)
Chronic On chronic narcotics-oxycodone-we are slowly tapering him off medication Currently taking oxycodone 5 mg pills for up to 25 mg/day.  He is dividing the medication as he wishes, but not to exceed 25 mg in 1 day-realizes he does better when he sticks to a specific regimen with the pill dosing He is exercising regularly and that is helping with his pain Will decrease to oxycodone 20 mg maximum per day-using 5 mg pills he can divide this up as he wishes, but stressed that he cannot exceed 20 mg/day-he knows that I will not fill this early-continue regimented dosing [place] controlled substance database checked.  Prescription last filled 07/19/22-the change in medication will take effect mid July Also on Cymbalta 30 mg daily-continue Follow-up in 4 months since he will be changing the prescription next month and we will continue to taper oxycodone

## 2022-07-21 NOTE — Assessment & Plan Note (Addendum)
Chronic Anxious and depressed at times Did not notice much of a change with the addition of the Cymbalta Continue clonazepam 1 mg twice daily Continue Cymbalta 30 mg daily-could consider increasing in the future

## 2022-07-27 ENCOUNTER — Ambulatory Visit: Payer: Medicare Other | Admitting: Nurse Practitioner

## 2022-07-27 DIAGNOSIS — C7931 Secondary malignant neoplasm of brain: Secondary | ICD-10-CM | POA: Diagnosis not present

## 2022-08-03 NOTE — Progress Notes (Unsigned)
Cardiology Office Note:    Date:  08/04/2022   ID:  Maxwell Fox, DOB 10/29/1945, MRN [id number]  PCP:  Pincus Sanes, MD   [facility] Providers Cardiologist:  Verne Carrow, MD     Referring MD: Pincus Sanes, MD   Chief Complaint: HFrEF  History of Present Illness:    Maxwell Fox is a very pleasant 77 y.o. male with a hx of sinus bradycardia, mild AAS, BPH, hypertension, HLD, iron deficiency anemia, melanoma with brain metastasis, and obesity s/p gastric bypass.   Had an episode of dizziness July 2020 and HR was in the 30s. He was seen in the ED and had PVCs with bigeminy.  He had an echo and stress test and Dr. Annitta Jersey office.  Echo 08/30/2018 LVEF 50%, G1 DD, mild LVH, mild MR, mild AS. Nuclear stress test 08/24/2018, no ischemia. Metastatic melanoma with treatment at [facility] and is in remission.  He had a tumor removal and radiation.  He has adrenal insufficiency post immunotherapy and is on daily prednisone.  He was admitted to [facility] February 2022 with upper GI bleeding due to AVMs.  Patient was in the ED 05/16/2022 with headache, generalized weakness. MRI showed chronic temporal lobe lesion with mild edema. He was trying to wean himself off oxycodone. Hs Troponins 73 >> 69.  EKG revealed TWI in lead III. He wanted to go home, but daughter was saying he had worsening DOE.  Could only take 10 steps and was out of breath.  SOB with deep breathing or talking.  Came in in a wheelchair due to weakness.  Never used a cane or walker before.  Wife was at home with flulike symptoms. He had diarrhea but it was improving. WBC 3.1 in the ED. He denied chest pain.  Seen in clinic by Jacolyn Reedy, PA on 05/20/22 at which time he continued to have DOE and shortness of breath at rest and with talking. CT ordered to rule out PE.  He was diagnosed with COVID-19 after testing at home due to symptoms during clinic visit. Echo 06/19/22 revealed LVEF 30-35%, G1DD, normal RV function and size,  mild MR, mild calcification of aortic valve with no aortic stenosis, borderline dilatation of ascending aorta, measuring 38 mm. Nuclear stress test revealed findings consistent with infarction with peri-infarct ischemia, intermediate risk.  Medium defect with severe reduction in uptake present in the apical to basal inferior and inferior septal location that is partially reversible, abnormal wall motion in this area, consistent with infarction and peri-infarct ischemia. EF previously 50% in 2020. Per Dr. Clifton James, "I think we should get him back into the office and see how he is feeling. His nuclear stress test looks overall unchanged from before but the EF on the echo is lower. His other echo was in Dr. Annitta Jersey office so I can't compare images. If we see him back and he is still having dyspnea after recovering from Covid, I think a cath is reasonable."  Seen by me on 07/06/22. Reported feeling great. Is having no further symptoms of shortness of breath. Is walking 2 miles daily with no dyspnea, chest discomfort or activity intolerance. Walking pace is consistent. No palpitations or chest pain. No orthopnea, edema, PND, presyncope, syncope. Believes his symptoms were secondary to Covid infection because symptoms have resolved. Has mild bilateral lower extremity edema. He denied orthopnea, PND, presyncope, syncope. On oxycodone reduction program (2/2 prior hip fracture and cancer). Is having frequent PVCs on EKG. BP has been well controlled  at home. He was started on GDMT including Entresto 24-26 mg BID and carvedilol 3.125 mg twice daily. He notified us on 07/10/22 that he was feeling short of breath and very fatigued. He stopped both medications and symptoms resolved. He tried low dose valsartan but continued to have low blood pressure so it was stopped.  Today, he is here alone for follow-up. Has been feeling better since stopping Entresto and carvedilol but is only able to take a quarter of losartan pill  without having significant fatigue. Reports he continues to work out daily with exercise program he learned from PT after a pelvis fracture. Includes walking and chair, floor exercises. He denies chest pain. Has some DOE with walking at times, but not every time he works out. BP has been well controlled at home - typically 110s to 130s systolic and 60s-70s diastolic. On one occasion BP was 126/78 after exercise, he then broke out in profuse sweat and BP was 104/64. He did not have any pain with this episode.  He denies edema, orthopnea, PND, palpitations, presyncope, syncope.  Past Medical History:  Diagnosis Date   B12 nutritional deficiency    malabsorbtion   Blood transfusion without reported diagnosis 2022   BPH (benign prostatic hypertrophy)    Brain cancer (HCC) 2017   removed "golf ball sized mass"   Depression    on meds   Diverticulosis    GERD (gastroesophageal reflux disease)    on meds   GI bleeding 02/2020   Hypertension    Iron deficiency anemia    malabsorption related (s/p gastric bypass)   Melanoma (HCC)    Obesity    s/p gastric bypass 2000, start 355#   Pancreatic cyst    Pulmonary nodules    Thyroid disease    on meds    Past Surgical History:  Procedure Laterality Date   CHOLECYSTECTOMY  2000   COLONOSCOPY WITH PROPOFOL N/A 03/05/2020   Procedure: COLONOSCOPY WITH PROPOFOL;  Surgeon: Lemar Lofty., MD;  Location: [facility] ENDOSCOPY;  Service: Gastroenterology;  Laterality: N/A;   CRANIOTOMY FOR TUMOR  12/11/2015   metastatic melanoma   ENTEROSCOPY N/A 03/30/2020   Procedure: ENTEROSCOPY;  Surgeon: Hilarie Fredrickson, MD;  Location: [facility] ENDOSCOPY;  Service: Endoscopy;  Laterality: N/A;   ESOPHAGOGASTRODUODENOSCOPY N/A 03/30/2020   Procedure: ESOPHAGOGASTRODUODENOSCOPY (EGD);  Surgeon: Hilarie Fredrickson, MD;  Location: [facility] ENDOSCOPY;  Service: Endoscopy;  Laterality: N/A;   ESOPHAGOGASTRODUODENOSCOPY (EGD) WITH PROPOFOL N/A 02/14/2020   Procedure:  ESOPHAGOGASTRODUODENOSCOPY (EGD) WITH PROPOFOL;  Surgeon: Shellia Cleverly, DO;  Location: [facility] ENDOSCOPY;  Service: Endoscopy;  Laterality: N/A;   ESOPHAGOGASTRODUODENOSCOPY (EGD) WITH PROPOFOL N/A 02/26/2020   Procedure: ESOPHAGOGASTRODUODENOSCOPY (EGD) WITH PROPOFOL;  Surgeon: Hilarie Fredrickson, MD;  Location: [facility] ENDOSCOPY;  Service: Endoscopy;  Laterality: N/A;   ESOPHAGOGASTRODUODENOSCOPY (EGD) WITH PROPOFOL N/A 03/03/2020   Procedure: ESOPHAGOGASTRODUODENOSCOPY (EGD) WITH PROPOFOL;  Surgeon: Jeani Hawking, MD;  Location: [facility] ENDOSCOPY;  Service: Endoscopy;  Laterality: N/A;   ESOPHAGOGASTRODUODENOSCOPY (EGD) WITH PROPOFOL N/A 03/28/2020   Procedure: ESOPHAGOGASTRODUODENOSCOPY (EGD) WITH PROPOFOL;  Surgeon: Hilarie Fredrickson, MD;  Location: [facility] ENDOSCOPY;  Service: Endoscopy;  Laterality: N/A;   FOREIGN BODY REMOVAL  03/30/2020   Procedure: FOREIGN BODY REMOVAL;  Surgeon: Hilarie Fredrickson, MD;  Location: [facility] ENDOSCOPY;  Service: Endoscopy;;   GIVENS CAPSULE STUDY N/A 03/28/2020   Procedure: GIVENS CAPSULE STUDY;  Surgeon: Hilarie Fredrickson, MD;  Location: [facility] ENDOSCOPY;  Service: Endoscopy;  Laterality: N/A;   HEMOSTASIS CONTROL  02/26/2020  Procedure: HEMOSTASIS CONTROL;  Surgeon: Hilarie Fredrickson, MD;  Location: [facility] ENDOSCOPY;  Service: Endoscopy;;   HEMOSTASIS CONTROL  03/30/2020   Procedure: HEMOSTASIS CONTROL;  Surgeon: Hilarie Fredrickson, MD;  Location: [facility] ENDOSCOPY;  Service: Endoscopy;;   HOT HEMOSTASIS N/A 02/26/2020   Procedure: HOT HEMOSTASIS (ARGON PLASMA COAGULATION/BICAP);  Surgeon: Hilarie Fredrickson, MD;  Location: [facility] ENDOSCOPY;  Service: Endoscopy;  Laterality: N/A;   MOHS SURGERY  2012   melanoma, mid back, [place]   ROUX-EN-Y PROCEDURE  2000   TOTAL SHOULDER REPLACEMENT Right 2011   [place]   WISDOM TOOTH EXTRACTION      Current Medications: Current Meds  Medication Sig   acetaminophen (TYLENOL) 325 MG tablet Take 2 tablets (650 mg total) by mouth every 6 (six) hours as needed for moderate pain.    calcium carbonate (OS-CAL - DOSED IN MG OF ELEMENTAL CALCIUM) 1250 (500 Ca) MG tablet Take 1 tablet by mouth daily with supper.   Cholecalciferol (VITAMIN D) 2000 UNITS tablet Take 1 tablet (2,000 Units total) by mouth daily.   clonazePAM (KLONOPIN) 1 MG tablet TAKE 1 TABLET BY MOUTH TWICE DAILY AS NEEDED FOR ANXIETY   DULoxetine (CYMBALTA) 30 MG capsule Take 1 capsule (30 mg total) by mouth daily.   finasteride (PROSCAR) 5 MG tablet Take 1 tablet (5 mg total) by mouth daily.   levothyroxine (SYNTHROID) 88 MCG tablet TAKE 1 TABLET BY MOUTH EVERY DAY BEFORE BREAKFAST   Multiple Vitamin (MULTIVITAMIN WITH MINERALS) TABS tablet Take 1 tablet by mouth daily.   omeprazole (PRILOSEC) 40 MG capsule Take 1 capsule (40 mg total) by mouth daily.   ondansetron (ZOFRAN) 4 MG tablet TAKE 1 TABLET(4 MG) BY MOUTH EVERY 8 HOURS AS NEEDED FOR NAUSEA OR VOMITING   oxyCODONE (ROXICODONE) 5 MG immediate release tablet Take 1 tablet (5 mg total) by mouth every 6 (six) hours as needed for severe pain (chronic back pain). MDD 4   predniSONE (DELTASONE) 5 MG tablet TAKE 1 TABLET BY MOUTH EVERY DAY WITH BREAKFAST   sucralfate (CARAFATE) 1 GM/10ML suspension Take 1 g by mouth 4 (four) times daily -  with meals and at bedtime.   tamsulosin (FLOMAX) 0.4 MG CAPS capsule TAKE 1 CAPSULE BY MOUTH EVERY DAY AFTER SUPPER   valsartan (DIOVAN) 40 MG tablet Take 0.5 tablets (20 mg total) by mouth daily. (Patient taking differently: Take 10 mg by mouth daily.)     Allergies:   Patient has no known allergies.   Social History   Socioeconomic History   Marital status: Married    Spouse name: Not on file   Number of children: 2   Years of education: Not on file   Highest education level: Some college, no degree  Occupational History   Occupation: general contractor-retired  Tobacco Use   Smoking status: Former    Types: Cigarettes    Quit date: 02/03/1963    Years since quitting: 59.5   Smokeless tobacco: Never  Vaping Use    Vaping Use: Never used  Substance and Sexual Activity   Alcohol use: Not Currently    Comment: wine 2x/week   Drug use: Never   Sexual activity: Not on file  Other Topics Concern   Not on file  Social History Narrative   Lives with wife   Retired - Geologist, 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[place] to be near [employer]: Low Risk  (07/21/2022)   Overall Financial Resource  Strain (CARDIA)    Difficulty of Paying Living Expenses: Not hard at all  Food Insecurity: No Food Insecurity (07/21/2022)   Hunger Vital Sign    Worried About Running Out of Food in the Last Year: Never true    Ran Out of Food in the Last Year: Never true  Transportation Needs: No Transportation Needs (07/21/2022)   PRAPARE - Administrator, Civil Service (Medical): No    Lack of Transportation (Non-Medical): No  Physical Activity: Sufficiently Active (07/21/2022)   Exercise Vital Sign    Days of Exercise per Week: 5 days    Minutes of Exercise per Session: 30 min  Stress: No Stress Concern Present (07/21/2022)   [employer] of Occupational Health - Occupational Stress Questionnaire    Feeling of Stress : Not at all  Social Connections: Moderately Isolated (07/21/2022)   Social Connection and Isolation Panel [NHANES]    Frequency of Communication with Friends and Family: More than three times a week    Frequency of Social Gatherings with Friends and Family: Once a week    Attends Religious Services: Never    Database administrator or Organizations: No    Attends Engineer, structural: Never    Marital Status: Married     Family History: The patient's family history includes Breast cancer in his sister; Hypertension in his father; Macular degeneration in his mother; Melanoma in his brother, mother, and sister; Stomach cancer (age of onset: 39) in his sister; Uterine cancer in his sister. There is no history of Colon polyps, Colon cancer,  Esophageal cancer, or Rectal cancer.  ROS:   Please see the history of present illness.    + occasional DOE All other systems reviewed and are negative.  Labs/Other Studies Reviewed:    The following studies were reviewed today:  Lexiscan Myoview 06/19/22   Findings are consistent with infarction with peri-infarct ischemia. The study is intermediate risk.   No ST deviation was noted.   LV perfusion is abnormal. Defect 1: There is a medium defect with severe reduction in uptake present in the apical to basal inferior and inferoseptal location(s) that is partially reversible. There is abnormal wall motion in the defect area. Consistent with infarction and peri-infarct ischemia.   Left ventricular function is abnormal. Global function is moderately reduced. Nuclear stress EF: 36 %. The left ventricular ejection fraction is moderately decreased (30-44%). End diastolic cavity size is mildly enlarged. End systolic cavity size is normal.   Prior study available for comparison from 08/24/2018. Inferoseptal infarct, LVEF 44%   Medium size, severe intensity mostly fixed (SDS 3) defect of the basal to apical inferior wall and mid-apical inferoseptal walls, consistent with infarct and minimal peri-infarct ischemia. LVEF 36% with inferior and inferoseptal akinesis. This is an intermediate risk study. Compared to a prior study in 2020, the fixed defect, which is suspected to be a scar, is unchanged. LVEF is now reported to be lower - echo correlation is recommended.   Echo 06/19/22 1. Left ventricular ejection fraction, by estimation, is 30 to 35%. The  left ventricle has moderately decreased function. The left ventricle has  no regional wall motion abnormalities. Left ventricular diastolic  parameters are consistent with Grade I  diastolic dysfunction (impaired relaxation).   2. Right ventricular systolic function is normal. The right ventricular  size is normal.   3. The mitral valve is normal in  structure. Mild mitral valve  regurgitation. No evidence of mitral stenosis.  4. The aortic valve is normal in structure. There is mild calcification  of the aortic valve. There is mild thickening of the aortic valve. Aortic  valve regurgitation is not visualized. No aortic stenosis is present.   5. Aortic dilatation noted. There is borderline dilatation of the  ascending aorta, measuring 38 mm.   6. The inferior vena cava is normal in size with greater than 50%  respiratory variability, suggesting right atrial pressure of 3 mmHg.   CTA Chest PE 05/20/22 IMPRESSION: 1. Technically adequate exam showing no acute pulmonary embolus. 2. Coronary artery calcifications. 3. Prior cholecystectomy and gastric bypass.   Recent Labs: 05/16/2022: ALT 17; TSH 0.449 05/20/2022: BUN 13; Creatinine, Ser 1.05; Hemoglobin 13.6; Platelets 179; Potassium 5.2; Sodium 138  Recent Lipid Panel    Component Value Date/Time   CHOL 195 01/07/2017 1034   TRIG 152.0 (H) 01/07/2017 1034   HDL 73.20 01/07/2017 1034   CHOLHDL 3 01/07/2017 1034   VLDL 30.4 01/07/2017 1034   LDLCALC 91 01/07/2017 1034     Risk Assessment/Calculations:           Physical Exam:    VS:  BP 130/76   Pulse 64   Ht 5\' 11"  (1.803 m)   Wt 181 lb (82.1 kg)   SpO2 97%   BMI 25.24 kg/m     Wt Readings from Last 3 Encounters:  08/04/22 181 lb (82.1 kg)  07/21/22 180 lb 6.4 oz (81.8 kg)  07/09/22 174 lb (78.9 kg)     GEN:  Well nourished, well developed in no acute distress HEENT: Normal NECK: No JVD; No carotid bruits CARDIAC: Irregular RR, no murmurs, rubs, gallops RESPIRATORY:  Clear to auscultation without rales, wheezing or rhonchi  ABDOMEN: Soft, non-tender, non-distended MUSCULOSKELETAL:  No edema; No deformity. 2+ pedal pulses, equal bilaterally SKIN: Warm and dry NEUROLOGIC:  Alert and oriented x 3 PSYCHIATRIC:  Normal affect   EKG:    EKG Interpretation Date/Time:  Tuesday August 04 2022 10:51:17  EDT Ventricular Rate:  64 PR Interval:  258 QRS Duration:  106 QT Interval:  420 QTC Calculation: 433 R Axis:   -11  Text Interpretation: Sinus rhythm with 1st degree A-V block with occasional Premature ventricular complexes Minimal voltage criteria for LVH, may be normal variant ( R in aVL ) Nonspecific ST and T wave abnormality When compared with ECG of 04-Aug-2022 10:50, Premature ventricular complexes are now Present Abberant conduction is no longer Present Confirmed by Eligha Bridegroom [phone]) on 08/04/2022 10:54:52 AM      Diagnoses:    1. DOE (dyspnea on exertion)   2. Abnormal stress test   3. Frequent PVCs   4. Acute on chronic combined systolic and diastolic congestive heart failure (HCC)   5. Coronary artery calcification seen on CT scan   6. Hyperlipidemia LDL goal <70   7. Essential hypertension     Assessment and Plan:     Acute on chronic combined  CHF: He returns today for follow-up, last seen 07/06/22 for management of newly reduced LVEF. Echo 06/19/2022 revealed newly reduced LVEF 30 to 35%, G1 DD. EF on lexiscan myoview 06/19/22 reports LVEF 36% (EF 50% 2020). Thought his shortness of breath was secondary to COVID infection. He did not tolerate low dose Entresto or low dose carvedilol. Continues to have DOE with mild exertion, but not every time. Lengthy discussion about potential causes of cardiomyopathy. Appears euvolemic on exam. Weight is stable. No orthopnea, PND, or edema. No chest pain.  Limiting sodium and fluids. Discussed management with Dr. Eldridge Dace, DOD, who agrees that with his intolerance to medical therapy, right and left heart catheterization is indicated. Risks of cardiac catheterization reviewed and patient agrees to proceed. He reports intolerance to NSAIDS due to stomach issues. I have asked him to start aspirin 81 mg 5 days prior to cath. Consider referral to [facility] Clinic.   Frequent PVCs: Frequent PVCs on EKG. He is unaware of these. Frequent PVCs may be  contributing to reduced EF. He did not tolerate low dose carvedilol.  Will await cath results and consider additional medical therapy at next office visit.   Abnormal stress test: Lexiscan Myoview 06/19/2022 revealed medium defect in apical to basal inferior and inferior septal locations that is partially reversible with abnormal wall motion in the defect area consistent with infarction and peri-infarct ischemia. Reduced LVEF. History of coronary artery calcifications noted on chest CT 05/20/2022.  Results reviewed with Dr. Eldridge Dace, DOD, who agrees with plan to proceed with cardiac catheterization in the setting of unable to tolerate low-dose GDMT for CHF and continued DOE.   Hypertension: He had significant hypotension on low doses of Entresto and carvedilol. He is taking < 25 mg of losartan daily (quarters a 25 mg tablet) and BP is stable.  Will await cath results for additional GDMT for CHF.  Mitral regurgitation: Mild MR on echo 06/19/2022. Continue to monitor clinically for now.   Coronary artery calcification on CT: He is having DOE as noted above which may be angina equivalent. Had planned to get lipid testing today, however it was overlooked in planning for cardiac cath. Will plan to get labs at next office visit.   Adrenal insufficiency: On chronic prednisone 5 mg daily. Management per endocrinology.      Disposition: 2-3 weeks post cath with me  Informed Consent   Shared Decision Making/Informed Consent The risks [stroke (1 in 1000), death (1 in 1000), kidney failure [usually temporary] (1 in 500), bleeding (1 in 200), allergic reaction [possibly serious] (1 in 200)], benefits (diagnostic support and management of coronary artery disease) and alternatives of a cardiac catheterization were discussed in detail with Mr. Bohnsack and he is willing to proceed.      Medication Adjustments/Labs and Tests Ordered: Current medicines are reviewed at length with the patient today.  Concerns  regarding medicines are outlined above.  Orders Placed This Encounter  Procedures   Basic Metabolic Panel (BMET)   CBC   EKG 12-Lead   EKG 12-Lead   No orders of the defined types were placed in this encounter.   Patient Instructions  Medication Instructions:  Your physician recommends that you continue on your current medications as directed. Please refer to the Current Medication list given to you today. *If you need a refill on your cardiac medications before your next appointment, please call your pharmacy*   Lab Work: TODAY-BMET & CBC If you have labs (blood work) drawn today and your tests are completely normal, you will receive your results only by: MyChart Message (if you have MyChart) OR A paper copy in the mail If you have any lab test that is abnormal or we need to change your treatment, we will call you to review the results.   Testing/Procedures: Your physician has requested that you have a cardiac catheterization. Cardiac catheterization is used to diagnose and/or treat various heart conditions. Doctors may recommend this procedure for a number of different reasons. The most common reason is to evaluate chest pain. Chest  pain can be a symptom of coronary artery disease (CAD), and cardiac catheterization can show whether plaque is narrowing or blocking your heart's arteries. This procedure is also used to evaluate the valves, as well as measure the blood flow and oxygen levels in different parts of your heart. For further information please visit [email or web address] Please follow instruction sheet, as given.   Follow-Up: At [facility], you and your health needs are our priority.  As part of our continuing mission to provide you with exceptional heart care, we have created designated Provider Care Teams.  These Care Teams include your primary Cardiologist (physician) and Advanced Practice Providers (APPs -  Physician Assistants and Nurse Practitioners) who all  work together to provide you with the care you need, when you need it.  We recommend signing up for the patient portal called "MyChart".  Sign up information is provided on this After Visit Summary.  MyChart is used to connect with patients for Virtual Visits (Telemedicine).  Patients are able to view lab/test results, encounter notes, upcoming appointments, etc.  Non-urgent messages can be sent to your provider as well.   To learn more about what you can do with MyChart, go to [email or web address].    Your next appointment:   2-3 week(s) AFTER CATH  Provider:   Eligha Bridegroom, NP       Other Instructions         Cardiac Catheterization   You are scheduled for a Cardiac Catheterization on Thursday, July 11 with Dr. Verne Carrow.  1. Please arrive at the [facility] (Main Entrance A) at [facility]: [address] [place], [place] [address] at 7:00 AM (This time is 2 hour(s) before your procedure to ensure your preparation). Free valet parking service is available. You will check in at ADMITTING. The support person will be asked to wait in the waiting room.  It is OK to have someone drop you off and come back when you are ready to be discharged.        Special note: Every effort is made to have your procedure done on time. Please understand that emergencies sometimes delay scheduled procedures.  2. Diet: Do not eat solid foods after midnight.  You may have clear liquids until 5 AM the day of the procedure.  3. Labs: You will need to have blood drawn on Tuesday, July 2 at [facility]. [address], [place]  Open: 7:30am - 5pm    Phone: [phone]. You do not need to be fasting.  4. Medication instructions in preparation for your procedure:   Contrast Allergy: No  On the morning of your procedure, take Aspirin 81 mg and any morning medicines NOT listed above.  You may use sips of water.  5. Plan to go home the same  day, you will only stay overnight if medically necessary. 6. You MUST have a responsible adult to drive you home. 7. An adult MUST be with you the first 24 hours after you arrive home. 8. Bring a current list of your medications, and the last time and date medication taken. 9. Bring ID and current insurance cards. 10.Please wear clothes that are easy to get on and off and wear slip-on shoes.  Thank you for allowing [place] to care for you!   -- [facility] Invasive Cardiovascular services    Signed, Levi Aland, NP  08/04/2022 12:45 PM    [place] [facility]

## 2022-08-03 NOTE — H&P (View-Only) (Signed)
 Cardiology Office Note:    Date:  08/04/2022   ID:  Maxwell Fox, DOB 04/22/1945, MRN [id number]  PCP:  Burns, Stacy J, MD   [facility] Providers Cardiologist:  Christopher McAlhany, MD     Referring MD: Burns, Stacy J, MD   Chief Complaint: HFrEF  History of Present Illness:    Maxwell Fox is a very pleasant 77 y.o. male with a hx of sinus bradycardia, mild AAS, BPH, hypertension, HLD, iron deficiency anemia, melanoma with brain metastasis, and obesity s/p gastric bypass.   Had an episode of dizziness July 2020 and HR was in the 30s. He was seen in the ED and had PVCs with bigeminy.  He had an echo and stress test and Dr. Harwani's office.  Echo 08/30/2018 LVEF 50%, G1 DD, mild LVH, mild MR, mild AS. Nuclear stress test 08/24/2018, no ischemia. Metastatic melanoma with treatment at [facility] and is in remission.  He had a tumor removal and radiation.  He has adrenal insufficiency post immunotherapy and is on daily prednisone.  He was admitted to [facility] February 2022 with upper GI bleeding due to AVMs.  Patient was in the ED 05/16/2022 with headache, generalized weakness. MRI showed chronic temporal lobe lesion with mild edema. He was trying to wean himself off oxycodone. Hs Troponins 73 >> 69.  EKG revealed TWI in lead III. He wanted to go home, but daughter was saying he had worsening DOE.  Could only take 10 steps and was out of breath.  SOB with deep breathing or talking.  Came in in a wheelchair due to weakness.  Never used a cane or walker before.  Wife was at home with flulike symptoms. He had diarrhea but it was improving. WBC 3.1 in the ED. He denied chest pain.  Seen in clinic by Michele Lenze, PA on 05/20/22 at which time he continued to have DOE and shortness of breath at rest and with talking. CT ordered to rule out PE.  He was diagnosed with COVID-19 after testing at home due to symptoms during clinic visit. Echo 06/19/22 revealed LVEF 30-35%, G1DD, normal RV function and size,  mild MR, mild calcification of aortic valve with no aortic stenosis, borderline dilatation of ascending aorta, measuring 38 mm. Nuclear stress test revealed findings consistent with infarction with peri-infarct ischemia, intermediate risk.  Medium defect with severe reduction in uptake present in the apical to basal inferior and inferior septal location that is partially reversible, abnormal wall motion in this area, consistent with infarction and peri-infarct ischemia. EF previously 50% in 2020. Per Dr. McAlhany, "I think we should get him back into the office and see how he is feeling. His nuclear stress test looks overall unchanged from before but the EF on the echo is lower. His other echo was in Dr. Harwani's office so I can't compare images. If we see him back and he is still having dyspnea after recovering from Covid, I think a cath is reasonable."  Seen by me on 07/06/22. Reported feeling great. Is having no further symptoms of shortness of breath. Is walking 2 miles daily with no dyspnea, chest discomfort or activity intolerance. Walking pace is consistent. No palpitations or chest pain. No orthopnea, edema, PND, presyncope, syncope. Believes his symptoms were secondary to Covid infection because symptoms have resolved. Has mild bilateral lower extremity edema. He denied orthopnea, PND, presyncope, syncope. On oxycodone reduction program (2/2 prior hip fracture and cancer). Is having frequent PVCs on EKG. BP has been well controlled   at home. He was started on GDMT including Entresto 24-26 mg BID and carvedilol 3.125 mg twice daily. He notified us on 07/10/22 that he was feeling short of breath and very fatigued. He stopped both medications and symptoms resolved. He tried low dose valsartan but continued to have low blood pressure so it was stopped.  Today, he is here alone for follow-up. Has been feeling better since stopping Entresto and carvedilol but is only able to take a quarter of losartan pill  without having significant fatigue. Reports he continues to work out daily with exercise program he learned from PT after a pelvis fracture. Includes walking and chair, floor exercises. He denies chest pain. Has some DOE with walking at times, but not every time he works out. BP has been well controlled at home - typically 110s to 130s systolic and 60s-70s diastolic. On one occasion BP was 126/78 after exercise, he then broke out in profuse sweat and BP was 104/64. He did not have any pain with this episode.  He denies edema, orthopnea, PND, palpitations, presyncope, syncope.  Past Medical History:  Diagnosis Date   B12 nutritional deficiency    malabsorbtion   Blood transfusion without reported diagnosis 2022   BPH (benign prostatic hypertrophy)    Brain cancer (HCC) 2017   removed "golf ball sized mass"   Depression    on meds   Diverticulosis    GERD (gastroesophageal reflux disease)    on meds   GI bleeding 02/2020   Hypertension    Iron deficiency anemia    malabsorption related (s/p gastric bypass)   Melanoma (HCC)    Obesity    s/p gastric bypass 2000, start 355#   Pancreatic cyst    Pulmonary nodules    Thyroid disease    on meds    Past Surgical History:  Procedure Laterality Date   CHOLECYSTECTOMY  2000   COLONOSCOPY WITH PROPOFOL N/A 03/05/2020   Procedure: COLONOSCOPY WITH PROPOFOL;  Surgeon: Mansouraty, Gabriel Jr., MD;  Location: [facility];  Service: Gastroenterology;  Laterality: N/A;   CRANIOTOMY FOR TUMOR  12/11/2015   metastatic melanoma   ENTEROSCOPY N/A 03/30/2020   Procedure: ENTEROSCOPY;  Surgeon: Perry, John N, MD;  Location: [facility];  Service: Endoscopy;  Laterality: N/A;   ESOPHAGOGASTRODUODENOSCOPY N/A 03/30/2020   Procedure: ESOPHAGOGASTRODUODENOSCOPY (EGD);  Surgeon: Perry, John N, MD;  Location: [facility];  Service: Endoscopy;  Laterality: N/A;   ESOPHAGOGASTRODUODENOSCOPY (EGD) WITH PROPOFOL N/A 02/14/2020   Procedure:  ESOPHAGOGASTRODUODENOSCOPY (EGD) WITH PROPOFOL;  Surgeon: Cirigliano, Vito V, DO;  Location: [facility];  Service: Endoscopy;  Laterality: N/A;   ESOPHAGOGASTRODUODENOSCOPY (EGD) WITH PROPOFOL N/A 02/26/2020   Procedure: ESOPHAGOGASTRODUODENOSCOPY (EGD) WITH PROPOFOL;  Surgeon: Perry, John N, MD;  [facility]: [facility];  Service: Endoscopy;  Laterality: N/A;   ESOPHAGOGASTRODUODENOSCOPY (EGD) WITH PROPOFOL N/A 03/03/2020   Procedure: ESOPHAGOGASTRODUODENOSCOPY (EGD) WITH PROPOFOL;  Surgeon: Hung, Patrick, MD;  Location: [facility];  Service: Endoscopy;  Laterality: N/A;   ESOPHAGOGASTRODUODENOSCOPY (EGD) WITH PROPOFOL N/A 03/28/2020   Procedure: ESOPHAGOGASTRODUODENOSCOPY (EGD) WITH PROPOFOL;  Surgeon: Perry, John N, MD;  Location: [facility];  Service: Endoscopy;  Laterality: N/A;   FOREIGN BODY REMOVAL  03/30/2020   Procedure: FOREIGN BODY REMOVAL;  Surgeon: Perry, John N, MD;  Location: [facility];  Service: Endoscopy;;   GIVENS CAPSULE STUDY N/A 03/28/2020   Procedure: GIVENS CAPSULE STUDY;  Surgeon: Perry, John N, MD;  Location: [facility];  Service: Endoscopy;  Laterality: N/A;   HEMOSTASIS CONTROL  02/26/2020     Procedure: HEMOSTASIS CONTROL;  Surgeon: Perry, John N, MD;  [facility]: [facility];  Service: Endoscopy;;   HEMOSTASIS CONTROL  03/30/2020   Procedure: HEMOSTASIS CONTROL;  Surgeon: Perry, John N, MD;  [facility]: [facility];  Service: Endoscopy;;   HOT HEMOSTASIS N/A 02/26/2020   Procedure: HOT HEMOSTASIS (ARGON PLASMA COAGULATION/BICAP);  Surgeon: Perry, John N, MD;  [facility]: [facility];  Service: Endoscopy;  Laterality: N/A;   MOHS SURGERY  2012   melanoma, mid back, [place]   ROUX-EN-Y PROCEDURE  2000   TOTAL SHOULDER REPLACEMENT Right 2011   [place]   WISDOM TOOTH EXTRACTION      Current Medications: Current Meds  Medication Sig   acetaminophen (TYLENOL) 325 MG tablet Take 2 tablets (650 mg total) by mouth every 6 (six) hours as needed for moderate pain.    calcium carbonate (OS-CAL - DOSED IN MG OF ELEMENTAL CALCIUM) 1250 (500 Ca) MG tablet Take 1 tablet by mouth daily with supper.   Cholecalciferol (VITAMIN D) 2000 UNITS tablet Take 1 tablet (2,000 Units total) by mouth daily.   clonazePAM (KLONOPIN) 1 MG tablet TAKE 1 TABLET BY MOUTH TWICE DAILY AS NEEDED FOR ANXIETY   DULoxetine (CYMBALTA) 30 MG capsule Take 1 capsule (30 mg total) by mouth daily.   finasteride (PROSCAR) 5 MG tablet Take 1 tablet (5 mg total) by mouth daily.   levothyroxine (SYNTHROID) 88 MCG tablet TAKE 1 TABLET BY MOUTH EVERY DAY BEFORE BREAKFAST   Multiple Vitamin (MULTIVITAMIN WITH MINERALS) TABS tablet Take 1 tablet by mouth daily.   omeprazole (PRILOSEC) 40 MG capsule Take 1 capsule (40 mg total) by mouth daily.   ondansetron (ZOFRAN) 4 MG tablet TAKE 1 TABLET(4 MG) BY MOUTH EVERY 8 HOURS AS NEEDED FOR NAUSEA OR VOMITING   oxyCODONE (ROXICODONE) 5 MG immediate release tablet Take 1 tablet (5 mg total) by mouth every 6 (six) hours as needed for severe pain (chronic back pain). MDD 4   predniSONE (DELTASONE) 5 MG tablet TAKE 1 TABLET BY MOUTH EVERY DAY WITH BREAKFAST   sucralfate (CARAFATE) 1 GM/10ML suspension Take 1 g by mouth 4 (four) times daily -  with meals and at bedtime.   tamsulosin (FLOMAX) 0.4 MG CAPS capsule TAKE 1 CAPSULE BY MOUTH EVERY DAY AFTER SUPPER   valsartan (DIOVAN) 40 MG tablet Take 0.5 tablets (20 mg total) by mouth daily. (Patient taking differently: Take 10 mg by mouth daily.)     Allergies:   Patient has no known allergies.   Social History   Socioeconomic History   Marital status: Married    Spouse name: Not on file   Number of children: 2   Years of education: Not on file   Highest education level: Some college, no degree  Occupational History   Occupation: general contractor-retired  Tobacco Use   Smoking status: Former    Types: Cigarettes    Quit date: 02/03/1963    Years since quitting: 59.5   Smokeless tobacco: Never  Vaping Use    Vaping Use: Never used  Substance and Sexual Activity   Alcohol use: Not Currently    Comment: wine 2x/week   Drug use: Never   Sexual activity: Not on file  Other Topics Concern   Not on file  Social History Narrative   Lives with wife   Retired - general contractor   Moved to [place] to be near g-kids   Social Determinants of Health   Financial Resource Strain: Low Risk  (07/21/2022)   Overall Financial Resource   Strain (CARDIA)    Difficulty of Paying Living Expenses: Not hard at all  Food Insecurity: No Food Insecurity (07/21/2022)   Hunger Vital Sign    Worried About Running Out of Food in the Last Year: Never true    Ran Out of Food in the Last Year: Never true  Transportation Needs: No Transportation Needs (07/21/2022)   PRAPARE - Transportation    Lack of Transportation (Medical): No    Lack of Transportation (Non-Medical): No  Physical Activity: Sufficiently Active (07/21/2022)   Exercise Vital Sign    Days of Exercise per Week: 5 days    Minutes of Exercise per Session: 30 min  Stress: No Stress Concern Present (07/21/2022)   [employer] - Occupational Stress Questionnaire    Feeling of Stress : Not at all  Social Connections: Moderately Isolated (07/21/2022)   Social Connection and Isolation Panel [NHANES]    Frequency of Communication with Friends and Family: More than three times a week    Frequency of Social Gatherings with Friends and Family: Once a week    Attends Religious Services: Never    Active Member of Clubs or Organizations: No    Attends Club or Organization Meetings: Never    Marital Status: Married     Family History: The patient's family history includes Breast cancer in his sister; Hypertension in his father; Macular degeneration in his mother; Melanoma in his brother, mother, and sister; Stomach cancer (age of onset: 63) in his sister; Uterine cancer in his sister. There is no history of Colon polyps, Colon cancer,  Esophageal cancer, or Rectal cancer.  ROS:   Please see the history of present illness.    + occasional DOE All other systems reviewed and are negative.  Labs/Other Studies Reviewed:    The following studies were reviewed today:  Lexiscan Myoview 06/19/22   Findings are consistent with infarction with peri-infarct ischemia. The study is intermediate risk.   No ST deviation was noted.   LV perfusion is abnormal. Defect 1: There is a medium defect with severe reduction in uptake present in the apical to basal inferior and inferoseptal location(s) that is partially reversible. There is abnormal wall motion in the defect area. Consistent with infarction and peri-infarct ischemia.   Left ventricular function is abnormal. Global function is moderately reduced. Nuclear stress EF: 36 %. The left ventricular ejection fraction is moderately decreased (30-44%). End diastolic cavity size is mildly enlarged. End systolic cavity size is normal.   Prior study available for comparison from 08/24/2018. Inferoseptal infarct, LVEF 44%   Medium size, severe intensity mostly fixed (SDS 3) defect of the basal to apical inferior wall and mid-apical inferoseptal walls, consistent with infarct and minimal peri-infarct ischemia. LVEF 36% with inferior and inferoseptal akinesis. This is an intermediate risk study. Compared to a prior study in 2020, the fixed defect, which is suspected to be a scar, is unchanged. LVEF is now reported to be lower - echo correlation is recommended.   Echo 06/19/22 1. Left ventricular ejection fraction, by estimation, is 30 to 35%. The  left ventricle has moderately decreased function. The left ventricle has  no regional wall motion abnormalities. Left ventricular diastolic  parameters are consistent with Grade I  diastolic dysfunction (impaired relaxation).   2. Right ventricular systolic function is normal. The right ventricular  size is normal.   3. The mitral valve is normal in  structure. Mild mitral valve  regurgitation. No evidence of mitral stenosis.     4. The aortic valve is normal in structure. There is mild calcification  of the aortic valve. There is mild thickening of the aortic valve. Aortic  valve regurgitation is not visualized. No aortic stenosis is present.   5. Aortic dilatation noted. There is borderline dilatation of the  ascending aorta, measuring 38 mm.   6. The inferior vena cava is normal in size with greater than 50%  respiratory variability, suggesting right atrial pressure of 3 mmHg.   CTA Chest PE 05/20/22 IMPRESSION: 1. Technically adequate exam showing no acute pulmonary embolus. 2. Coronary artery calcifications. 3. Prior cholecystectomy and gastric bypass.   Recent Labs: 05/16/2022: ALT 17; TSH 0.449 05/20/2022: BUN 13; Creatinine, Ser 1.05; Hemoglobin 13.6; Platelets 179; Potassium 5.2; Sodium 138  Recent Lipid Panel    Component Value Date/Time   CHOL 195 01/07/2017 1034   TRIG 152.0 (H) 01/07/2017 1034   HDL 73.20 01/07/2017 1034   CHOLHDL 3 01/07/2017 1034   VLDL 30.4 01/07/2017 1034   LDLCALC 91 01/07/2017 1034     Risk Assessment/Calculations:           Physical Exam:    VS:  BP 130/76   Pulse 64   Ht 5' 11" (1.803 m)   Wt 181 lb (82.1 kg)   SpO2 97%   BMI 25.24 kg/m     Wt Readings from Last 3 Encounters:  08/04/22 181 lb (82.1 kg)  07/21/22 180 lb 6.4 oz (81.8 kg)  07/09/22 174 lb (78.9 kg)     GEN:  Well nourished, well developed in no acute distress HEENT: Normal NECK: No JVD; No carotid bruits CARDIAC: Irregular RR, no murmurs, rubs, gallops RESPIRATORY:  Clear to auscultation without rales, wheezing or rhonchi  ABDOMEN: Soft, non-tender, non-distended MUSCULOSKELETAL:  No edema; No deformity. 2+ pedal pulses, equal bilaterally SKIN: Warm and dry NEUROLOGIC:  Alert and oriented x 3 PSYCHIATRIC:  Normal affect   EKG:    EKG Interpretation Date/Time:  Tuesday August 04 2022 10:51:17  EDT Ventricular Rate:  64 PR Interval:  258 QRS Duration:  106 QT Interval:  420 QTC Calculation: 433 R Axis:   -11  Text Interpretation: Sinus rhythm with 1st degree A-V block with occasional Premature ventricular complexes Minimal voltage criteria for LVH, may be normal variant ( R in aVL ) Nonspecific ST and T wave abnormality When compared with ECG of 04-Aug-2022 10:50, Premature ventricular complexes are now Present Abberant conduction is no longer Present Confirmed by Diamonte Stavely ([id number]) on 08/04/2022 10:54:52 AM      Diagnoses:    1. DOE (dyspnea on exertion)   2. Abnormal stress test   3. Frequent PVCs   4. Acute on chronic combined systolic and diastolic congestive heart failure (HCC)   5. Coronary artery calcification seen on CT scan   6. Hyperlipidemia LDL goal <70   7. Essential hypertension     Assessment and Plan:     Acute on chronic combined  CHF: He returns today for follow-up, last seen 07/06/22 for management of newly reduced LVEF. Echo 06/19/2022 revealed newly reduced LVEF 30 to 35%, G1 DD. EF on lexiscan myoview 06/19/22 reports LVEF 36% (EF 50% 2020). Thought his shortness of breath was secondary to COVID infection. He did not tolerate low dose Entresto or low dose carvedilol. Continues to have DOE with mild exertion, but not every time. Lengthy discussion about potential causes of cardiomyopathy. Appears euvolemic on exam. Weight is stable. No orthopnea, PND, or edema. No chest pain.   Limiting sodium and fluids. Discussed management with Dr. Varanasi, DOD, who agrees that with his intolerance to medical therapy, right and left heart catheterization is indicated. Risks of cardiac catheterization reviewed and patient agrees to proceed. He reports intolerance to NSAIDS due to stomach issues. I have asked him to start aspirin 81 mg 5 days prior to cath. Consider referral to [facility].   Frequent PVCs: Frequent PVCs on EKG. He is unaware of these. Frequent PVCs may be  contributing to reduced EF. He did not tolerate low dose carvedilol.  Will await cath results and consider additional medical therapy at next office visit.   Abnormal stress test: Lexiscan Myoview 06/19/2022 revealed medium defect in apical to basal inferior and inferior septal locations that is partially reversible with abnormal wall motion in the defect area consistent with infarction and peri-infarct ischemia. Reduced LVEF. History of coronary artery calcifications noted on chest CT 05/20/2022.  Results reviewed with Dr. Varanasi, DOD, who agrees with plan to proceed with cardiac catheterization in the setting of unable to tolerate low-dose GDMT for CHF and continued DOE.   Hypertension: He had significant hypotension on low doses of Entresto and carvedilol. He is taking < 25 mg of losartan daily (quarters a 25 mg tablet) and BP is stable.  Will await cath results for additional GDMT for CHF.  Mitral regurgitation: Mild MR on echo 06/19/2022. Continue to monitor clinically for now.   Coronary artery calcification on CT: He is having DOE as noted above which may be angina equivalent. Had planned to get lipid testing today, however it was overlooked in planning for cardiac cath. Will plan to get labs at next office visit.   Adrenal insufficiency: On chronic prednisone 5 mg daily. Management per endocrinology.      Disposition: 2-3 weeks post cath with me  Informed Consent   Shared Decision Making/Informed Consent The risks [stroke (1 in 1000), death (1 in 1000), kidney failure [usually temporary] (1 in 500), bleeding (1 in 200), allergic reaction [possibly serious] (1 in 200)], benefits (diagnostic support and management of coronary artery disease) and alternatives of a cardiac catheterization were discussed in detail with Maxwell Fox and he is willing to proceed.      Medication Adjustments/Labs and Tests Ordered: Current medicines are reviewed at length with the patient today.  Concerns  regarding medicines are outlined above.  Orders Placed This Encounter  Procedures   Basic Metabolic Panel (BMET)   CBC   EKG 12-Lead   EKG 12-Lead   No orders of the defined types were placed in this encounter.   Patient Instructions  Medication Instructions:  Your physician recommends that you continue on your current medications as directed. Please refer to the Current Medication list given to you today. *If you need a refill on your cardiac medications before your next appointment, please call your pharmacy*   Lab Work: TODAY-BMET & CBC If you have labs (blood work) drawn today and your tests are completely normal, you will receive your results only by: MyChart Message (if you have MyChart) OR A paper copy in the mail If you have any lab test that is abnormal or we need to change your treatment, we will call you to review the results.   Testing/Procedures: Your physician has requested that you have a cardiac catheterization. Cardiac catheterization is used to diagnose and/or treat various heart conditions. Doctors may recommend this procedure for a number of different reasons. The most common reason is to evaluate chest pain. Chest   pain can be a symptom of coronary artery disease (CAD), and cardiac catheterization can show whether plaque is narrowing or blocking your heart's arteries. This procedure is also used to evaluate the valves, as well as measure the blood flow and oxygen levels in different parts of your heart. For further information please visit www.cardiosmart.org. Please follow instruction sheet, as given.   Follow-Up: At [facility], you and your health needs are our priority.  As part of our continuing mission to provide you with exceptional heart care, we have created designated Provider Care Teams.  These Care Teams include your primary Cardiologist (physician) and Advanced Practice Providers (APPs -  Physician Assistants and Nurse Practitioners) who all  work together to provide you with the care you need, when you need it.  We recommend signing up for the patient portal called "MyChart".  Sign up information is provided on this After Visit Summary.  MyChart is used to connect with patients for Virtual Visits (Telemedicine).  Patients are able to view lab/test results, encounter notes, upcoming appointments, etc.  Non-urgent messages can be sent to your provider as well.   To learn more about what you can do with MyChart, go to [email or web address].    Your next appointment:   2-3 week(s) AFTER CATH  Provider:   Twanda Stakes, NP       Other Instructions         Cardiac Catheterization   You are scheduled for a Cardiac Catheterization on Thursday, July 11 with Dr. Christopher McAlhany.  1. Please arrive at the [facility] Tower (Main Entrance A) at [facility]: [address] [place], [place] [address] at 7:00 AM (This time is 2 hour(s) before your procedure to ensure your preparation). Free valet parking service is available. You will check in at ADMITTING. The support person will be asked to wait in the waiting room.  It is OK to have someone drop you off and come back when you are ready to be discharged.        Special note: Every effort is made to have your procedure done on time. Please understand that emergencies sometimes delay scheduled procedures.  2. Diet: Do not eat solid foods after midnight.  You may have clear liquids until 5 AM the day of the procedure.  3. Labs: You will need to have blood drawn on Tuesday, July 2 at [facility] [address], [place]  Open: 7:30am - 5pm    Phone: [phone]. You do not need to be fasting.  4. Medication instructions in preparation for your procedure:   Contrast Allergy: No  On the morning of your procedure, take Aspirin 81 mg and any morning medicines NOT listed above.  You may use sips of water.  5. Plan to go home the same  day, you will only stay overnight if medically necessary. 6. You MUST have a responsible adult to drive you home. 7. An adult MUST be with you the first 24 hours after you arrive home. 8. Bring a current list of your medications, and the last time and date medication taken. 9. Bring ID and current insurance cards. 10.Please wear clothes that are easy to get on and off and wear slip-on shoes.  Thank you for allowing us to care for you!   -- [facility]    Signed, Temitope Griffing M, NP  08/04/2022 12:45 PM    [facility] 

## 2022-08-04 ENCOUNTER — Ambulatory Visit: Payer: Medicare Other | Attending: Nurse Practitioner | Admitting: Nurse Practitioner

## 2022-08-04 ENCOUNTER — Encounter: Payer: Self-pay | Admitting: Nurse Practitioner

## 2022-08-04 VITALS — BP 130/76 | HR 64 | Ht 71.0 in | Wt 181.0 lb

## 2022-08-04 DIAGNOSIS — I5043 Acute on chronic combined systolic (congestive) and diastolic (congestive) heart failure: Secondary | ICD-10-CM | POA: Insufficient documentation

## 2022-08-04 DIAGNOSIS — I2510 Atherosclerotic heart disease of native coronary artery without angina pectoris: Secondary | ICD-10-CM | POA: Insufficient documentation

## 2022-08-04 DIAGNOSIS — R9439 Abnormal result of other cardiovascular function study: Secondary | ICD-10-CM | POA: Insufficient documentation

## 2022-08-04 DIAGNOSIS — R0609 Other forms of dyspnea: Secondary | ICD-10-CM | POA: Diagnosis not present

## 2022-08-04 DIAGNOSIS — I10 Essential (primary) hypertension: Secondary | ICD-10-CM | POA: Insufficient documentation

## 2022-08-04 DIAGNOSIS — E785 Hyperlipidemia, unspecified: Secondary | ICD-10-CM | POA: Insufficient documentation

## 2022-08-04 DIAGNOSIS — I493 Ventricular premature depolarization: Secondary | ICD-10-CM | POA: Insufficient documentation

## 2022-08-04 LAB — BASIC METABOLIC PANEL
Creatinine, Ser: 0.9 mg/dL (ref 0.76–1.27)
Potassium: 5.2 mmol/L (ref 3.5–5.2)
Sodium: 139 mmol/L (ref 134–144)
eGFR: 88 mL/min/{1.73_m2} (ref >59)

## 2022-08-04 LAB — CBC

## 2022-08-04 NOTE — Patient Instructions (Addendum)
Medication Instructions:  Your physician recommends that you continue on your current medications as directed. Please refer to the Current Medication list given to you today. *If you need a refill on your cardiac medications before your next appointment, please call your pharmacy*   Lab Work: TODAY-BMET & CBC If you have labs (blood work) drawn today and your tests are completely normal, you will receive your results only by: MyChart Message (if you have MyChart) OR A paper copy in the mail If you have any lab test that is abnormal or we need to change your treatment, we will call you to review the results.   Testing/Procedures: Your physician has requested that you have a cardiac catheterization. Cardiac catheterization is used to diagnose and/or treat various heart conditions. Doctors may recommend this procedure for a number of different reasons. The most common reason is to evaluate chest pain. Chest pain can be a symptom of coronary artery disease (CAD), and cardiac catheterization can show whether plaque is narrowing or blocking your heart's arteries. This procedure is also used to evaluate the valves, as well as measure the blood flow and oxygen levels in different parts of your heart. For further information please visit [email or web address] Please follow instruction sheet, as given.   Follow-Up: At [facility], you and your health needs are our priority.  As part of our continuing mission to provide you with exceptional heart care, we have created designated Provider Care Teams.  These Care Teams include your primary Cardiologist (physician) and Advanced Practice Providers (APPs -  Physician Assistants and Nurse Practitioners) who all work together to provide you with the care you need, when you need it.  We recommend signing up for the patient portal called "MyChart".  Sign up information is provided on this After Visit Summary.  MyChart is used to connect with patients for  Virtual Visits (Telemedicine).  Patients are able to view lab/test results, encounter notes, upcoming appointments, etc.  Non-urgent messages can be sent to your provider as well.   To learn more about what you can do with MyChart, go to [email or web address].    Your next appointment:   2-3 week(s) AFTER CATH  Provider:   Eligha Bridegroom, NP       Other Instructions         Cardiac Catheterization   You are scheduled for a Cardiac Catheterization on Thursday, July 11 with Dr. Verne Carrow.  1. Please arrive at the [facility] (Main Entrance A) at [facility]: [address] [place], [place] [address] at 7:00 AM (This time is 2 hour(s) before your procedure to ensure your preparation). Free valet parking service is available. You will check in at ADMITTING. The support person will be asked to wait in the waiting room.  It is OK to have someone drop you off and come back when you are ready to be discharged.        Special note: Every effort is made to have your procedure done on time. Please understand that emergencies sometimes delay scheduled procedures.  2. Diet: Do not eat solid foods after midnight.  You may have clear liquids until 5 AM the day of the procedure.  3. Labs: You will need to have blood drawn on Tuesday, July 2 at [facility]. [address], [place]  Open: 7:30am - 5pm    Phone: [phone]. You do not need to be fasting.  4. Medication instructions in preparation for  your procedure:   Contrast Allergy: No  On the morning of your procedure, take Aspirin 81 mg and any morning medicines NOT listed above.  You may use sips of water.  5. Plan to go home the same day, you will only stay overnight if medically necessary. 6. You MUST have a responsible adult to drive you home. 7. An adult MUST be with you the first 24 hours after you arrive home. 8. Bring a current list of your medications, and the last  time and date medication taken. 9. Bring ID and current insurance cards. 10.Please wear clothes that are easy to get on and off and wear slip-on shoes.  Thank you for allowing [place] to care for you!   -- [facility]

## 2022-08-05 DIAGNOSIS — R7989 Other specified abnormal findings of blood chemistry: Secondary | ICD-10-CM | POA: Diagnosis not present

## 2022-08-05 DIAGNOSIS — G8929 Other chronic pain: Secondary | ICD-10-CM | POA: Diagnosis not present

## 2022-08-05 DIAGNOSIS — C7951 Secondary malignant neoplasm of bone: Secondary | ICD-10-CM | POA: Diagnosis not present

## 2022-08-05 DIAGNOSIS — Z87891 Personal history of nicotine dependence: Secondary | ICD-10-CM | POA: Diagnosis not present

## 2022-08-05 DIAGNOSIS — E273 Drug-induced adrenocortical insufficiency: Secondary | ICD-10-CM | POA: Diagnosis not present

## 2022-08-05 DIAGNOSIS — Z7952 Long term (current) use of systemic steroids: Secondary | ICD-10-CM | POA: Diagnosis not present

## 2022-08-05 DIAGNOSIS — E2740 Unspecified adrenocortical insufficiency: Secondary | ICD-10-CM | POA: Diagnosis not present

## 2022-08-05 DIAGNOSIS — M545 Low back pain, unspecified: Secondary | ICD-10-CM | POA: Diagnosis not present

## 2022-08-05 DIAGNOSIS — M2550 Pain in unspecified joint: Secondary | ICD-10-CM | POA: Diagnosis not present

## 2022-08-05 DIAGNOSIS — E038 Other specified hypothyroidism: Secondary | ICD-10-CM | POA: Diagnosis not present

## 2022-08-05 DIAGNOSIS — Z79899 Other long term (current) drug therapy: Secondary | ICD-10-CM | POA: Diagnosis not present

## 2022-08-05 DIAGNOSIS — H209 Unspecified iridocyclitis: Secondary | ICD-10-CM | POA: Diagnosis not present

## 2022-08-05 DIAGNOSIS — Z85820 Personal history of malignant melanoma of skin: Secondary | ICD-10-CM | POA: Diagnosis not present

## 2022-08-05 DIAGNOSIS — Z08 Encounter for follow-up examination after completed treatment for malignant neoplasm: Secondary | ICD-10-CM | POA: Diagnosis not present

## 2022-08-05 DIAGNOSIS — Z79890 Hormone replacement therapy (postmenopausal): Secondary | ICD-10-CM | POA: Diagnosis not present

## 2022-08-05 DIAGNOSIS — C7931 Secondary malignant neoplasm of brain: Secondary | ICD-10-CM | POA: Diagnosis not present

## 2022-08-05 DIAGNOSIS — C439 Malignant melanoma of skin, unspecified: Secondary | ICD-10-CM | POA: Diagnosis not present

## 2022-08-05 DIAGNOSIS — E039 Hypothyroidism, unspecified: Secondary | ICD-10-CM | POA: Diagnosis not present

## 2022-08-05 DIAGNOSIS — Z9889 Other specified postprocedural states: Secondary | ICD-10-CM | POA: Diagnosis not present

## 2022-08-05 LAB — CBC
Hematocrit: 36.2 % — ABNORMAL LOW (ref 37.5–51.0)
Hemoglobin: 12.1 g/dL — ABNORMAL LOW (ref 13.0–17.7)
MCH: 29.3 pg (ref 26.6–33.0)
MCHC: 33.4 g/dL (ref 31.5–35.7)
MCV: 88 fL (ref 79–97)
RBC: 4.13 x10E6/uL — ABNORMAL LOW (ref 4.14–5.80)
RDW: 14 % (ref 11.6–15.4)
WBC: 6.7 10*3/uL (ref 3.4–10.8)

## 2022-08-05 LAB — BASIC METABOLIC PANEL
BUN/Creatinine Ratio: 16 (ref 10–24)
BUN: 14 mg/dL (ref 8–27)
CO2: 23 mmol/L (ref 20–29)
Calcium: 9 mg/dL (ref 8.6–10.2)
Chloride: 105 mmol/L (ref 96–106)
Glucose: 90 mg/dL (ref 70–99)

## 2022-08-12 ENCOUNTER — Telehealth: Payer: Self-pay | Admitting: *Deleted

## 2022-08-12 NOTE — Telephone Encounter (Signed)
Left detailed voicemail message (DPR))with procedure instructions, call the office if any questions.

## 2022-08-12 NOTE — Telephone Encounter (Signed)
Cardiac Catheterization scheduled at [facility] for: Thursday August 13, 2022 9 AM Arrival time [facility] Main Entrance A at: 7 AM  Nothing to eat after midnight prior to procedure, clear liquids until 5 AM day of procedure.  Medication instructions: -Usual morning medications can be taken with sips of water including aspirin 81 mg.  Confirmed patient has responsible adult to drive home post procedure and be with patient first 24 hours after arriving home.  Plan to go home the same day, you will only stay overnight if medically necessary.  Left message for patient to call back to review procedure instructions.

## 2022-08-13 ENCOUNTER — Other Ambulatory Visit: Payer: Self-pay

## 2022-08-13 ENCOUNTER — Encounter: Payer: Self-pay | Admitting: Internal Medicine

## 2022-08-13 ENCOUNTER — Encounter (HOSPITAL_COMMUNITY)
Admission: RE | Disposition: A | Discharge status: Home / Self Care | Payer: Self-pay | Source: Home / Self Care | Attending: Cardiovascular Disease

## 2022-08-13 ENCOUNTER — Ambulatory Visit (HOSPITAL_COMMUNITY)
Admission: RE | Admit: 2022-08-13 | Discharge: 2022-08-13 | Disposition: A | Discharge status: Home / Self Care | Payer: Medicare Other | Attending: Cardiovascular Disease | Admitting: Cardiovascular Disease

## 2022-08-13 ENCOUNTER — Other Ambulatory Visit (HOSPITAL_COMMUNITY): Payer: Self-pay

## 2022-08-13 DIAGNOSIS — I110 Hypertensive heart disease with heart failure: Secondary | ICD-10-CM | POA: Insufficient documentation

## 2022-08-13 DIAGNOSIS — R9439 Abnormal result of other cardiovascular function study: Secondary | ICD-10-CM

## 2022-08-13 DIAGNOSIS — I25110 Atherosclerotic heart disease of native coronary artery with unstable angina pectoris: Secondary | ICD-10-CM | POA: Diagnosis not present

## 2022-08-13 DIAGNOSIS — E669 Obesity, unspecified: Secondary | ICD-10-CM | POA: Diagnosis not present

## 2022-08-13 DIAGNOSIS — R0609 Other forms of dyspnea: Secondary | ICD-10-CM | POA: Insufficient documentation

## 2022-08-13 DIAGNOSIS — N400 Enlarged prostate without lower urinary tract symptoms: Secondary | ICD-10-CM | POA: Diagnosis not present

## 2022-08-13 DIAGNOSIS — Z9884 Bariatric surgery status: Secondary | ICD-10-CM | POA: Insufficient documentation

## 2022-08-13 DIAGNOSIS — C7931 Secondary malignant neoplasm of brain: Secondary | ICD-10-CM | POA: Insufficient documentation

## 2022-08-13 DIAGNOSIS — D509 Iron deficiency anemia, unspecified: Secondary | ICD-10-CM | POA: Insufficient documentation

## 2022-08-13 DIAGNOSIS — I5042 Chronic combined systolic (congestive) and diastolic (congestive) heart failure: Secondary | ICD-10-CM | POA: Diagnosis not present

## 2022-08-13 DIAGNOSIS — Z87891 Personal history of nicotine dependence: Secondary | ICD-10-CM | POA: Diagnosis not present

## 2022-08-13 DIAGNOSIS — E2740 Unspecified adrenocortical insufficiency: Secondary | ICD-10-CM | POA: Diagnosis not present

## 2022-08-13 DIAGNOSIS — Z85820 Personal history of malignant melanoma of skin: Secondary | ICD-10-CM | POA: Insufficient documentation

## 2022-08-13 DIAGNOSIS — Z7952 Long term (current) use of systemic steroids: Secondary | ICD-10-CM | POA: Insufficient documentation

## 2022-08-13 DIAGNOSIS — Z7902 Long term (current) use of antithrombotics/antiplatelets: Secondary | ICD-10-CM | POA: Insufficient documentation

## 2022-08-13 DIAGNOSIS — E785 Hyperlipidemia, unspecified: Secondary | ICD-10-CM | POA: Diagnosis not present

## 2022-08-13 DIAGNOSIS — Z79899 Other long term (current) drug therapy: Secondary | ICD-10-CM | POA: Diagnosis not present

## 2022-08-13 DIAGNOSIS — I5043 Acute on chronic combined systolic (congestive) and diastolic (congestive) heart failure: Secondary | ICD-10-CM

## 2022-08-13 DIAGNOSIS — Z6824 Body mass index (BMI) 24.0-24.9, adult: Secondary | ICD-10-CM | POA: Diagnosis not present

## 2022-08-13 DIAGNOSIS — Z955 Presence of coronary angioplasty implant and graft: Secondary | ICD-10-CM | POA: Insufficient documentation

## 2022-08-13 HISTORY — PX: CORONARY STENT INTERVENTION: CATH118234

## 2022-08-13 HISTORY — PX: RIGHT/LEFT HEART CATH AND CORONARY ANGIOGRAPHY: CATH118266

## 2022-08-13 SURGERY — RIGHT/LEFT HEART CATH AND CORONARY ANGIOGRAPHY
Anesthesia: LOCAL

## 2022-08-13 MED ORDER — HEPARIN SODIUM (PORCINE) 1000 UNIT/ML IJ SOLN
INTRAMUSCULAR | Status: AC
  Filled 2022-08-13: qty 10

## 2022-08-13 MED ORDER — CLOPIDOGREL BISULFATE 75 MG PO TABS: 75.0000 mg | ORAL_TABLET | Freq: Every day | ORAL | Status: DC

## 2022-08-13 MED ORDER — DULOXETINE HCL 30 MG PO CPEP: 30.0000 mg | ORAL_CAPSULE | Freq: Every day | ORAL | Status: DC

## 2022-08-13 MED ORDER — SODIUM CHLORIDE 0.9 % WEIGHT BASED INFUSION: 1.0000 mL/kg/h | INTRAVENOUS | Status: DC

## 2022-08-13 MED ORDER — SODIUM CHLORIDE 0.9% FLUSH: 3.0000 mL | INTRAVENOUS | Status: DC | PRN

## 2022-08-13 MED ORDER — HYDRALAZINE HCL 20 MG/ML IJ SOLN: 10.0000 mg | INTRAMUSCULAR | Status: DC | PRN

## 2022-08-13 MED ORDER — CLOPIDOGREL BISULFATE 300 MG PO TABS
ORAL_TABLET | ORAL | Status: DC | PRN
  Administered 2022-08-13: 600 mg via ORAL

## 2022-08-13 MED ORDER — HEPARIN SODIUM (PORCINE) 1000 UNIT/ML IJ SOLN
INTRAMUSCULAR | Status: DC | PRN
  Administered 2022-08-13: 2000 [IU] via INTRAVENOUS
  Administered 2022-08-13: 6000 [IU] via INTRAVENOUS
  Administered 2022-08-13: 4000 [IU] via INTRAVENOUS

## 2022-08-13 MED ORDER — HEPARIN (PORCINE) IN NACL 1000-0.9 UT/500ML-% IV SOLN
INTRAVENOUS | Status: DC | PRN
  Administered 2022-08-13 (×2): 500 mL

## 2022-08-13 MED ORDER — SODIUM CHLORIDE 0.9% FLUSH: 3.0000 mL | Freq: Two times a day (BID) | INTRAVENOUS | Status: DC

## 2022-08-13 MED ORDER — ASPIRIN 81 MG PO CHEW: 81.0000 mg | CHEWABLE_TABLET | Freq: Every day | ORAL | Status: DC

## 2022-08-13 MED ORDER — SODIUM CHLORIDE 0.9 % IV SOLN: 250.0000 mL | INTRAVENOUS | Status: DC | PRN

## 2022-08-13 MED ORDER — FINASTERIDE 5 MG PO TABS: 5.0000 mg | ORAL_TABLET | Freq: Every day | ORAL | Status: DC

## 2022-08-13 MED ORDER — SODIUM CHLORIDE 0.9 % IV SOLN: INTRAVENOUS | Status: DC

## 2022-08-13 MED ORDER — ASPIRIN 81 MG PO CHEW: 81.0000 mg | CHEWABLE_TABLET | ORAL | Status: DC

## 2022-08-13 MED ORDER — ASPIRIN 81 MG PO CHEW
81.0000 mg | CHEWABLE_TABLET | Freq: Every day | ORAL | 3 refills | Status: DC
  Filled 2022-08-13: qty 90, 90d supply, fill #0

## 2022-08-13 MED ORDER — LIDOCAINE HCL (PF) 1 % IJ SOLN
INTRAMUSCULAR | Status: AC
  Filled 2022-08-13: qty 30

## 2022-08-13 MED ORDER — ONDANSETRON HCL 4 MG/2ML IJ SOLN: 4.0000 mg | Freq: Four times a day (QID) | INTRAMUSCULAR | Status: DC | PRN

## 2022-08-13 MED ORDER — FENTANYL CITRATE (PF) 100 MCG/2ML IJ SOLN
INTRAMUSCULAR | Status: DC | PRN
  Administered 2022-08-13: 25 ug via INTRAVENOUS

## 2022-08-13 MED ORDER — CLOPIDOGREL BISULFATE 75 MG PO TABS
75.0000 mg | ORAL_TABLET | Freq: Every day | ORAL | 5 refills | Status: DC
  Filled 2022-08-13: qty 30, 30d supply, fill #0

## 2022-08-13 MED ORDER — ROSUVASTATIN CALCIUM 20 MG PO TABS
20.0000 mg | ORAL_TABLET | Freq: Every day | ORAL | 11 refills | Status: DC
  Filled 2022-08-13: qty 30, 30d supply, fill #0

## 2022-08-13 MED ORDER — FENTANYL CITRATE (PF) 100 MCG/2ML IJ SOLN
INTRAMUSCULAR | Status: DC | PRN
  Administered 2022-08-13 (×2): 25 ug via INTRAVENOUS

## 2022-08-13 MED ORDER — SODIUM CHLORIDE 0.9 % WEIGHT BASED INFUSION
3.0000 mL/kg/h | INTRAVENOUS | Status: DC
  Administered 2022-08-13: 3 mL/kg/h via INTRAVENOUS

## 2022-08-13 MED ORDER — ACETAMINOPHEN 325 MG PO TABS: 650.0000 mg | ORAL_TABLET | ORAL | Status: DC | PRN

## 2022-08-13 MED ORDER — LABETALOL HCL 5 MG/ML IV SOLN: 10.0000 mg | INTRAVENOUS | Status: DC | PRN

## 2022-08-13 MED ORDER — PANTOPRAZOLE SODIUM 40 MG PO TBEC
40.0000 mg | DELAYED_RELEASE_TABLET | Freq: Every day | ORAL | 3 refills | Status: DC
  Filled 2022-08-13: qty 30, 30d supply, fill #0

## 2022-08-13 MED ORDER — MIDAZOLAM HCL 2 MG/2ML IJ SOLN
INTRAMUSCULAR | Status: AC
  Filled 2022-08-13: qty 2

## 2022-08-13 MED ORDER — TAMSULOSIN HCL 0.4 MG PO CAPS: 0.4000 mg | ORAL_CAPSULE | Freq: Every morning | ORAL | Status: DC

## 2022-08-13 MED ORDER — LEVOTHYROXINE SODIUM 88 MCG PO TABS: 88.0000 ug | ORAL_TABLET | Freq: Every morning | ORAL | Status: DC

## 2022-08-13 MED ORDER — IOHEXOL 350 MG/ML SOLN
INTRAVENOUS | Status: DC | PRN
  Administered 2022-08-13: 100 mL

## 2022-08-13 MED ORDER — FENTANYL CITRATE (PF) 100 MCG/2ML IJ SOLN
INTRAMUSCULAR | Status: AC
  Filled 2022-08-13: qty 2

## 2022-08-13 MED ORDER — VERAPAMIL HCL 2.5 MG/ML IV SOLN
INTRAVENOUS | Status: DC | PRN
  Administered 2022-08-13: 10 mL via INTRA_ARTERIAL

## 2022-08-13 MED ORDER — MIDAZOLAM HCL 2 MG/2ML IJ SOLN
INTRAMUSCULAR | Status: DC | PRN
  Administered 2022-08-13 (×2): 1 mg via INTRAVENOUS

## 2022-08-13 MED ORDER — NITROGLYCERIN 1 MG/10 ML FOR IR/CATH LAB
INTRA_ARTERIAL | Status: AC
  Filled 2022-08-13: qty 10

## 2022-08-13 MED ORDER — MIDAZOLAM HCL 2 MG/2ML IJ SOLN
INTRAMUSCULAR | Status: DC | PRN
  Administered 2022-08-13: 1 mg via INTRAVENOUS

## 2022-08-13 MED ORDER — NITROGLYCERIN 1 MG/10 ML FOR IR/CATH LAB
INTRA_ARTERIAL | Status: DC | PRN
  Administered 2022-08-13: 200 ug via INTRACORONARY

## 2022-08-13 MED ORDER — FAMOTIDINE IN NACL 20-0.9 MG/50ML-% IV SOLN
INTRAVENOUS | Status: DC | PRN
  Administered 2022-08-13: 20 mg via INTRAVENOUS

## 2022-08-13 MED ORDER — FAMOTIDINE IN NACL 20-0.9 MG/50ML-% IV SOLN
INTRAVENOUS | Status: AC
  Filled 2022-08-13: qty 50

## 2022-08-13 MED ORDER — VERAPAMIL HCL 2.5 MG/ML IV SOLN
INTRAVENOUS | Status: AC
  Filled 2022-08-13: qty 2

## 2022-08-13 MED ORDER — LIDOCAINE HCL (PF) 1 % IJ SOLN
INTRAMUSCULAR | Status: DC | PRN
  Administered 2022-08-13 (×2): 2 mL

## 2022-08-13 MED ORDER — PREDNISONE 5 MG PO TABS: 5.0000 mg | ORAL_TABLET | Freq: Every day | ORAL | Status: DC

## 2022-08-13 SURGICAL SUPPLY — 19 items
BALLN EMERGE MR 2.0X12 (BALLOONS) ×1
BALLN ~~LOC~~ EMERGE MR 3.75X12 (BALLOONS) ×1
BALLOON EMERGE MR 2.0X12 (BALLOONS) IMPLANT
BALLOON ~~LOC~~ EMERGE MR 3.75X12 (BALLOONS) IMPLANT
CATH 5FR JL3.5 JR4 ANG PIG MP (CATHETERS) IMPLANT
CATH BALLN WEDGE 5F 110CM (CATHETERS) IMPLANT
CATH VISTA GUIDE 6FR XBLAD3.5 (CATHETERS) IMPLANT
GLIDESHEATH SLEND SS 6F .021 (SHEATH) IMPLANT
GUIDEWIRE .025 260CM (WIRE) IMPLANT
GUIDEWIRE INQWIRE 1.5J.035X260 (WIRE) IMPLANT
INQWIRE 1.5J .035X260CM (WIRE) ×1
KIT ENCORE 26 ADVANTAGE (KITS) IMPLANT
KIT HEART LEFT (KITS) ×1 IMPLANT
PACK CARDIAC CATHETERIZATION (CUSTOM PROCEDURE TRAY) ×1 IMPLANT
SHEATH GLIDE SLENDER 4/5FR (SHEATH) IMPLANT
STENT ONYX FRONTIER 3.5X15 (Permanent Stent) IMPLANT
TRANSDUCER W/STOPCOCK (MISCELLANEOUS) ×1 IMPLANT
TUBING CIL FLEX 10 FLL-RA (TUBING) ×1 IMPLANT
WIRE COUGAR XT STRL 190CM (WIRE) IMPLANT

## 2022-08-13 NOTE — Discharge Summary (Signed)
Discharge Summary for Same Day PCI   Patient ID: Maxwell Fox MRN: [id number]; DOB: [birth year]  Admit date: 08/13/2022 Discharge date: 08/13/2022  Primary Care Provider: Pincus Sanes, MD  Primary Cardiologist: Verne Carrow, MD  Primary Electrophysiologist:  None   Discharge Diagnoses    Active Problems:   * No active hospital problems. *    Diagnostic Studies/Procedures    Cardiac Catheterization 08/13/2022:    Mid Cx to Dist Cx lesion is 40% stenosed.   Prox LAD lesion is 95% stenosed.   1st Mrg lesion is 40% stenosed.   A drug-eluting stent was successfully placed using a STENT ONYX FRONTIER 3.5X15.   Post intervention, there is a 0% residual stenosis.   Severe proximal LAD stenosis Successful PTCA/DES x 1 proximal LAD Moderate non-obstructive disease in the first obtuse marginal ostium and in the distal Circumflex No disease in the large dominant RCA     Recommendations: DAPT with ASA/Plavix for at least six months. Change Prilosec to Protonix given use of Plavix.  _____________   History of Present Illness     Maxwell Fox is a 77 y.o. male with a hx of sinus bradycardia, mild AAS, BPH, hypertension, HLD, iron deficiency anemia, melanoma with brain metastasis, and obesity s/p gastric bypass. Patient was in the ED 05/16/2022 with headache, generalized weakness. He was subsequently admitted and noted to also have exertional dyspnea. Seen in clinic by Jacolyn Reedy, PA on 05/20/22 at which time he continued to have DOE and shortness of breath at rest and with talking. CT ordered to rule out PE. He was diagnosed with COVID-19 after testing at home due to symptoms during clinic visit. Echo 06/19/22 revealed LVEF 30-35%, G1DD, normal RV function and size, mild MR, mild calcification of aortic valve with no aortic stenosis, borderline dilatation of ascending aorta, measuring 38 mm. Nuclear stress test revealed findings consistent with infarction with peri-infarct  ischemia, intermediate risk. Medium defect with severe reduction in uptake present in the apical to basal inferior and inferior septal location that is partially reversible, abnormal wall motion in this area, consistent with infarction and peri-infarct ischemia. Following recovery from COVID, patient had improvement in exertional tolerance, however, given reduced LVEF and stress test findings, cardiac catheterization was arranged for further evaluation.  Hospital Course     The patient underwent cardiac cath as noted above with Dr. Clifton James. Plan for DAPT with ASA/Plavix for at least six months. The patient was seen by cardiac rehab while in short stay. There were no observed complications post cath. Radial cath site was re-evaluated prior to discharge and found to be stable without any complications. Instructions/precautions regarding cath site care were given prior to discharge.  Octaviano Mukai was seen by Dr. Clifton James and myself and determined stable for discharge home. Follow up with our office has been arranged. Medications are listed below. Pertinent changes include: new Plavix load with daily 75mg  dosing along with ASA 81mg . Patient switched from Prilosec to Protonix. Start Crestor 20mg .     _____________  Cath/PCI Registry Performance & Quality Measures: Aspirin prescribed? - Yes ADP Receptor Inhibitor (Plavix/Clopidogrel, Brilinta/Ticagrelor or Effient/Prasugrel) prescribed (includes medically managed patients)? - Yes High Intensity Statin (Lipitor 40-80mg  or Crestor 20-40mg ) prescribed? - Yes For EF <40%, was ACEI/ARB prescribed? - Yes For EF <40%, Aldosterone Antagonist (Spironolactone or Eplerenone) prescribed? - No - Reason:  K 5.2 at last BMP. Will need repeat outpatient labs and consideration of initiation with follow up.  Cardiac Rehab Phase II ordered (Included  Medically managed Patients)? - Yes  _____________   Discharge Vitals Blood pressure 113/70, pulse 83, temperature  (!) 97.2 F (36.2 C), temperature source Temporal, resp. rate 16, height 5\' 11"  (1.803 m), weight 79.8 kg, SpO2 98%.  Filed Weights   08/13/22 0727  Weight: 79.8 kg    Last Labs & Radiologic Studies    CBC No results for input(s): "WBC", "NEUTROABS", "HGB", "HCT", "MCV", "PLT" in the last 72 hours. Basic Metabolic Panel No results for input(s): "NA", "K", "CL", "CO2", "GLUCOSE", "BUN", "CREATININE", "CALCIUM", "MG", "PHOS" in the last 72 hours. Liver Function Tests No results for input(s): "AST", "ALT", "ALKPHOS", "BILITOT", "PROT", "ALBUMIN" in the last 72 hours. No results for input(s): "LIPASE", "AMYLASE" in the last 72 hours. High Sensitivity Troponin:   No results for input(s): "TROPONINIHS" in the last 720 hours.  BNP Invalid input(s): "POCBNP" D-Dimer No results for input(s): "DDIMER" in the last 72 hours. Hemoglobin A1C No results for input(s): "HGBA1C" in the last 72 hours. Fasting Lipid Panel No results for input(s): "CHOL", "HDL", "LDLCALC", "TRIG", "CHOLHDL", "LDLDIRECT" in the last 72 hours. Thyroid Function Tests No results for input(s): "TSH", "T4TOTAL", "T3FREE", "THYROIDAB" in the last 72 hours.  Invalid input(s): "FREET3" _____________  CARDIAC CATHETERIZATION  Result Date: 08/13/2022   Mid Cx to Dist Cx lesion is 40% stenosed.   Prox LAD lesion is 95% stenosed.   1st Mrg lesion is 40% stenosed.   A drug-eluting stent was successfully placed using a STENT ONYX FRONTIER 3.5X15.   Post intervention, there is a 0% residual stenosis. Severe proximal LAD stenosis Successful PTCA/DES x 1 proximal LAD Moderate non-obstructive disease in the first obtuse marginal ostium and in the distal Circumflex No disease in the large dominant RCA Recommendations: DAPT with ASA/Plavix for at least six months. Change Prilosec to Protonix given use of Plavix.    Disposition   Pt is being discharged home today in good condition.  Follow-up Plans & Appointments        Discharge  Medications   Allergies as of 08/13/2022   No Known Allergies      Medication List     STOP taking these medications    omeprazole 40 MG capsule Commonly known as: PRILOSEC       TAKE these medications    acetaminophen 325 MG tablet Commonly known as: TYLENOL Take 2 tablets (650 mg total) by mouth every 6 (six) hours as needed for moderate pain.   Aspirin Low Dose 81 MG chewable tablet Generic drug: aspirin Chew 1 tablet (81 mg total) by mouth daily.   calcium carbonate 1250 (500 Ca) MG tablet Commonly known as: OS-CAL - dosed in mg of elemental calcium Take 1 tablet by mouth daily with supper.   Carafate 1 GM/10ML suspension Generic drug: sucralfate Take 1 g by mouth in the morning.   clonazePAM 1 MG tablet Commonly known as: KLONOPIN TAKE 1 TABLET BY MOUTH TWICE DAILY AS NEEDED FOR ANXIETY What changed:  how much to take how to take this when to take this additional instructions   clopidogrel 75 MG tablet Commonly known as: PLAVIX Take 1 tablet (75 mg total) by mouth daily with breakfast. Start taking on: August 14, 2022   DULoxetine 30 MG capsule Commonly known as: Cymbalta Take 1 capsule (30 mg total) by mouth daily.   finasteride 5 MG tablet Commonly known as: Proscar Take 1 tablet (5 mg total) by mouth daily. What changed: when to take this   levothyroxine 88 MCG  tablet Commonly known as: SYNTHROID TAKE 1 TABLET BY MOUTH EVERY DAY BEFORE BREAKFAST   multivitamin with minerals Tabs tablet Take 1 tablet by mouth in the morning.   ondansetron 4 MG tablet Commonly known as: ZOFRAN TAKE 1 TABLET(4 MG) BY MOUTH EVERY 8 HOURS AS NEEDED FOR NAUSEA OR VOMITING   oxyCODONE 5 MG immediate release tablet Commonly known as: Roxicodone Take 1 tablet (5 mg total) by mouth every 6 (six) hours as needed for severe pain (chronic back pain). MDD 4   pantoprazole 40 MG tablet Commonly known as: Protonix Take 1 tablet (40 mg total) by mouth daily. Please  follow up with primary care provider for additional refills.   predniSONE 5 MG tablet Commonly known as: DELTASONE TAKE 1 TABLET BY MOUTH EVERY DAY WITH BREAKFAST   rosuvastatin 20 MG tablet Commonly known as: Crestor Take 1 tablet (20 mg total) by mouth daily.   tamsulosin 0.4 MG Caps capsule Commonly known as: FLOMAX TAKE 1 CAPSULE BY MOUTH EVERY DAY AFTER SUPPER What changed:  how much to take how to take this when to take this additional instructions   valsartan 40 MG tablet Commonly known as: DIOVAN Take 0.5 tablets (20 mg total) by mouth daily.   Vitamin D 50 MCG (2000 UT) tablet Take 1 tablet (2,000 Units total) by mouth daily.           Allergies No Known Allergies  Outstanding Labs/Studies     Duration of Discharge Encounter   Greater than 30 minutes including physician time.  SignedPerlie Gold, PA-C 08/13/2022, 1:05 PM

## 2022-08-13 NOTE — Progress Notes (Signed)
Phase 1 cardiac rehab [phone]. Has stent card. Reviewed exercise instructions, temperature precautions end point of exercise. heart healthy diet information reviewed with the patient. Referral sent for traditional/Intensive cardiac rehab. Mr Maxwell Fox is not sure that he will be able to participate at this time due to his ongoing cancer diagnosis. Discussed the importance of taking plavix, Reviewed use of sublingual nitroglycerin and when to call 911.Patient states understanding.Gladstone Lighter, RN,BSN 08/13/2022 1:49 PM

## 2022-08-13 NOTE — Discharge Instructions (Addendum)

## 2022-08-13 NOTE — Progress Notes (Signed)
Discharge instructions given to patient and family. Verbal understanding. PA asked about nitroglycerin, no new orders given.

## 2022-08-13 NOTE — Interval H&P Note (Signed)
History and Physical Interval Note:  08/13/2022 7:26 AM  Maxwell Fox  has presented today for surgery, with the diagnosis of heart failure.  The various methods of treatment have been discussed with the patient and family. After consideration of risks, benefits and other options for treatment, the patient has consented to  Procedure(s): RIGHT/LEFT HEART CATH AND CORONARY ANGIOGRAPHY (N/A) as a surgical intervention.  The patient's history has been reviewed, patient examined, no change in status, stable for surgery.  I have reviewed the patient's chart and labs.  Questions were answered to the patient's satisfaction.    Cath Lab Visit (complete for each Cath Lab visit)  Clinical Evaluation Leading to the Procedure:   ACS: No.  Non-ACS:    Anginal Classification: CCS II  Anti-ischemic medical therapy: No Therapy  Non-Invasive Test Results: Intermediate-risk stress test findings: cardiac mortality 1-3%/year  Prior CABG: No previous CABG        Verne Carrow

## 2022-08-14 ENCOUNTER — Encounter (HOSPITAL_COMMUNITY): Payer: Self-pay | Admitting: Cardiovascular Disease

## 2022-08-14 ENCOUNTER — Other Ambulatory Visit: Payer: Self-pay | Admitting: Internal Medicine

## 2022-08-14 LAB — POCT ACTIVATED CLOTTING TIME
Activated Clotting Time: 269 seconds
Activated Clotting Time: 305 seconds

## 2022-08-14 LAB — POCT I-STAT 7, (LYTES, BLD GAS, ICA,H+H)
Acid-base deficit: 6 mmol/L — ABNORMAL HIGH (ref 0.0–2.0)
Bicarbonate: 18.5 mmol/L — ABNORMAL LOW (ref 20.0–28.0)
Calcium, Ion: 1.04 mmol/L — ABNORMAL LOW (ref 1.15–1.40)
HCT: 33 % — ABNORMAL LOW (ref 39.0–52.0)
Hemoglobin: 11.2 g/dL — ABNORMAL LOW (ref 13.0–17.0)
O2 Saturation: 96 %
Potassium: 4.5 mmol/L (ref 3.5–5.1)
Sodium: 142 mmol/L (ref 135–145)
TCO2: 20 mmol/L — ABNORMAL LOW (ref 22–32)
pCO2 arterial: 33.5 mmHg (ref 32–48)
pH, Arterial: 7.351 (ref 7.35–7.45)
pO2, Arterial: 87 mmHg (ref 83–108)

## 2022-08-14 LAB — POCT I-STAT EG7
Acid-base deficit: 9 mmol/L — ABNORMAL HIGH (ref 0.0–2.0)
Bicarbonate: 16.2 mmol/L — ABNORMAL LOW (ref 20.0–28.0)
Calcium, Ion: 0.7 mmol/L — CL (ref 1.15–1.40)
HCT: 26 % — ABNORMAL LOW (ref 39.0–52.0)
Hemoglobin: 8.8 g/dL — ABNORMAL LOW (ref 13.0–17.0)
O2 Saturation: 75 %
Potassium: 3.4 mmol/L — ABNORMAL LOW (ref 3.5–5.1)
Sodium: 151 mmol/L — ABNORMAL HIGH (ref 135–145)
TCO2: 17 mmol/L — ABNORMAL LOW (ref 22–32)
pCO2, Ven: 30.9 mmHg — ABNORMAL LOW (ref 44–60)
pH, Ven: 7.328 (ref 7.25–7.43)
pO2, Ven: 42 mmHg (ref 32–45)

## 2022-08-17 ENCOUNTER — Telehealth: Payer: Self-pay | Admitting: Internal Medicine

## 2022-08-17 MED ORDER — OXYCODONE HCL 5 MG PO TABS: 5.0000 mg | ORAL_TABLET | Freq: Four times a day (QID) | ORAL | 0 refills | Status: DC | PRN

## 2022-08-17 NOTE — Telephone Encounter (Signed)
Pt called wanting to speak with Albin Felling about his oxyCODONE (ROXICODONE) 5 MG immediate release tablet. Please advise

## 2022-08-17 NOTE — Telephone Encounter (Signed)
Patient said he is out of medication. He would like to know if it can be filled. He would like a call back from [place] at [phone].

## 2022-08-17 NOTE — Telephone Encounter (Signed)
Per chart Oxycodone was sent to [facility]...lmb

## 2022-08-26 NOTE — Progress Notes (Signed)
Cardiology Office Note:    Date:  09/02/2022   ID:  Maxwell Fox, DOB 03-08-1945, MRN [id number]  PCP:  Pincus Sanes, MD   [facility] Providers Cardiologist:  Verne Carrow, MD     Referring MD: Pincus Sanes, MD   Chief Complaint: follow-up post PCI/DES   History of Present Illness:    Maxwell Fox is a very pleasant 77 y.o. male with a hx of sinus bradycardia, mild AAS, BPH, hypertension, HLD, iron deficiency anemia, melanoma with brain metastasis, and obesity s/p gastric bypass.   Had an episode of dizziness July 2020 and HR was in the 30s. He was seen in the ED and had PVCs with bigeminy.  He had an echo and stress test and Dr. Annitta Jersey office. Echo 08/30/2018 LVEF 50%, G1 DD, mild LVH, mild MR, mild AS. Nuclear stress test 08/24/2018, no ischemia. Metastatic melanoma with treatment at [facility] and is in remission.  He had a tumor removal and radiation. He has adrenal insufficiency post immunotherapy and is on daily prednisone. He was admitted to [facility] February 2022 with upper GI bleeding due to AVMs.  Patient was in the ED 05/16/2022 with headache, generalized weakness. MRI showed chronic temporal lobe lesion with mild edema. He was trying to wean himself off oxycodone. Hs Troponins 73 >> 69.  EKG revealed TWI in lead III. He wanted to go home, but daughter was saying he had worsening DOE.  Could only take 10 steps and was out of breath.  SOB with deep breathing or talking.  Came in in a wheelchair due to weakness.  Never used a cane or walker before.  Wife was at home with flulike symptoms. He had diarrhea but it was improving. WBC 3.1 in the ED. He denied chest pain.  Seen in clinic by Jacolyn Reedy, PA on 05/20/22 at which time he continued to have DOE and shortness of breath at rest and with talking. CT ordered to rule out PE.  He was diagnosed with COVID-19 after testing at home due to symptoms during clinic visit. Echo 06/19/22 revealed LVEF 30-35%, G1DD, normal RV  function and size, mild MR, mild calcification of aortic valve with no aortic stenosis, borderline dilatation of ascending aorta, measuring 38 mm. Nuclear stress test revealed findings consistent with infarction with peri-infarct ischemia, intermediate risk.  Medium defect with severe reduction in uptake present in the apical to basal inferior and inferior septal location that is partially reversible, abnormal wall motion in this area, consistent with infarction and peri-infarct ischemia. EF previously 50% in 2020. Per Dr. Clifton James, "I think we should get him back into the office and see how he is feeling. His nuclear stress test looks overall unchanged from before but the EF on the echo is lower. His other echo was in Dr. Annitta Jersey office so I can't compare images. If we see him back and he is still having dyspnea after recovering from Covid, I think a cath is reasonable."  Seen by me on 07/06/22. Reported feeling great. Is having no further symptoms of shortness of breath. Is walking 2 miles daily with no dyspnea, chest discomfort or activity intolerance. Walking pace is consistent. No palpitations or chest pain. No orthopnea, edema, PND, presyncope, syncope. Believes his symptoms were secondary to Covid infection because symptoms have resolved. Has mild bilateral lower extremity edema. He denied orthopnea, PND, presyncope, syncope. On oxycodone reduction program (2/2 prior hip fracture and cancer). Is having frequent PVCs on EKG. BP has been well controlled  at home. He was started on GDMT including Entresto 24-26 mg BID and carvedilol 3.125 mg twice daily. He notified us on 07/10/22 that he was feeling short of breath and very fatigued. He stopped both medications and symptoms resolved. He tried low dose valsartan but continued to have low blood pressure so it was stopped.  Seen by me on 08/04/22 for follow-up. He reported feeling better since stopping Entresto and carvedilol but only able to take a quarter of  losartan pill without having significant fatigue. Continuing to work out daily with exercise program he learned from PT after a pelvis fracture. Includes walking and chair, floor exercises. He denied chest pain. Has some DOE with walking at times, but not every time he works out. BP well controlled at home - typically 110s to 130s systolic and 60s-70s diastolic. On one occasion BP was 126/78 after exercise, he then broke out in profuse sweat and BP was 104/64. No pain associated with this episode.  He denied edema, orthopnea, PND, palpitations, presyncope, syncope. Discussed management with Dr. Eldridge Dace, DOD, who agreed that with his intolerance to medical therapy, right and left heart catheterization is indicated. Risks of cardiac catheterization reviewed and he agreed to proceed. Intolerance to NSAIDS due to stomach issues. Advised to take EC aspirin 81 mg 5 days prior to cath.   Right and left heart cath completed 08/13/22 revealed severe proximal LAD stenosis with successful PTCA/DES x 1, moderate non-obstructive disease in first obtuse marginal ostium and in distal circumflex. No disease in large dominant RCA. Recommendation for 6 months DAPT ASA/Plavix, Prilosec changed to Protonix.   Today, he is here for follow-up. He reports shortness of breath that started approximately one week after stent placement. He felt poorly the first 2 days after cath, then started to feel great and resumed exercise routine and regular household activities. Started to note increased shortness of breath occurring with his regular exercise routine, walking, and meal prep. He denies chest pain, lightheadedness, edema, orthopnea, PND, presyncope, and syncope. No obvious signs of bleeding, normal looking bowel movements. Has a history of bleeding ulcer following gastric bypass surgery. Gets up 4-5 times per night to urinate, is able to go back to sleep. Weight is stable. No problems with medications.   Past Medical History:   Diagnosis Date   B12 nutritional deficiency    malabsorbtion   Blood transfusion without reported diagnosis 2022   BPH (benign prostatic hypertrophy)    Brain cancer (HCC) 2017   removed "golf ball sized mass"   Depression    on meds   Diverticulosis    GERD (gastroesophageal reflux disease)    on meds   GI bleeding 02/2020   Hypertension    Iron deficiency anemia    malabsorption related (s/p gastric bypass)   Melanoma (HCC)    Obesity    s/p gastric bypass 2000, start 355#   Pancreatic cyst    Pulmonary nodules    Thyroid disease    on meds    Past Surgical History:  Procedure Laterality Date   CHOLECYSTECTOMY  2000   COLONOSCOPY WITH PROPOFOL N/A 03/05/2020   Procedure: COLONOSCOPY WITH PROPOFOL;  Surgeon: Lemar Lofty., MD;  Location: [facility] ENDOSCOPY;  Service: Gastroenterology;  Laterality: N/A;   CORONARY STENT INTERVENTION N/A 08/13/2022   Procedure: CORONARY STENT INTERVENTION;  Surgeon: [name], MD;  Location: [facility] INVASIVE CV LAB;  Service: Cardiovascular;  Laterality: N/A;   CRANIOTOMY FOR TUMOR  12/11/2015   metastatic melanoma  ENTEROSCOPY N/A 03/30/2020   Procedure: ENTEROSCOPY;  Surgeon: Hilarie Fredrickson, MD;  Location: [facility] ENDOSCOPY;  Service: Endoscopy;  Laterality: N/A;   ESOPHAGOGASTRODUODENOSCOPY N/A 03/30/2020   Procedure: ESOPHAGOGASTRODUODENOSCOPY (EGD);  Surgeon: Hilarie Fredrickson, MD;  Location: [facility] ENDOSCOPY;  Service: Endoscopy;  Laterality: N/A;   ESOPHAGOGASTRODUODENOSCOPY (EGD) WITH PROPOFOL N/A 02/14/2020   Procedure: ESOPHAGOGASTRODUODENOSCOPY (EGD) WITH PROPOFOL;  Surgeon: Shellia Cleverly, DO;  Location: [facility] ENDOSCOPY;  Service: Endoscopy;  Laterality: N/A;   ESOPHAGOGASTRODUODENOSCOPY (EGD) WITH PROPOFOL N/A 02/26/2020   Procedure: ESOPHAGOGASTRODUODENOSCOPY (EGD) WITH PROPOFOL;  Surgeon: Hilarie Fredrickson, MD;  Location: [facility] ENDOSCOPY;  Service: Endoscopy;  Laterality: N/A;   ESOPHAGOGASTRODUODENOSCOPY (EGD) WITH PROPOFOL N/A 03/03/2020    Procedure: ESOPHAGOGASTRODUODENOSCOPY (EGD) WITH PROPOFOL;  Surgeon: Jeani Hawking, MD;  Location: [facility] ENDOSCOPY;  Service: Endoscopy;  Laterality: N/A;   ESOPHAGOGASTRODUODENOSCOPY (EGD) WITH PROPOFOL N/A 03/28/2020   Procedure: ESOPHAGOGASTRODUODENOSCOPY (EGD) WITH PROPOFOL;  Surgeon: Hilarie Fredrickson, MD;  Location: [facility] ENDOSCOPY;  Service: Endoscopy;  Laterality: N/A;   FOREIGN BODY REMOVAL  03/30/2020   Procedure: FOREIGN BODY REMOVAL;  Surgeon: Hilarie Fredrickson, MD;  Location: [facility] ENDOSCOPY;  Service: Endoscopy;;   GIVENS CAPSULE STUDY N/A 03/28/2020   Procedure: GIVENS CAPSULE STUDY;  Surgeon: Hilarie Fredrickson, MD;  Location: [facility] ENDOSCOPY;  Service: Endoscopy;  Laterality: N/A;   HEMOSTASIS CONTROL  02/26/2020   Procedure: HEMOSTASIS CONTROL;  Surgeon: Hilarie Fredrickson, MD;  Location: [facility] ENDOSCOPY;  Service: Endoscopy;;   HEMOSTASIS CONTROL  03/30/2020   Procedure: HEMOSTASIS CONTROL;  Surgeon: Hilarie Fredrickson, MD;  Location: [facility] ENDOSCOPY;  Service: Endoscopy;;   HOT HEMOSTASIS N/A 02/26/2020   Procedure: HOT HEMOSTASIS (ARGON PLASMA COAGULATION/BICAP);  Surgeon: Hilarie Fredrickson, MD;  Location: [facility] ENDOSCOPY;  Service: Endoscopy;  Laterality: N/A;   MOHS SURGERY  2012   melanoma, mid back, [place]   RIGHT/LEFT HEART CATH AND CORONARY ANGIOGRAPHY N/A 08/13/2022   Procedure: RIGHT/LEFT HEART CATH AND CORONARY ANGIOGRAPHY;  Surgeon: [name], MD;  Location: [facility] INVASIVE CV LAB;  Service: Cardiovascular;  Laterality: N/A;   ROUX-EN-Y PROCEDURE  2000   TOTAL SHOULDER REPLACEMENT Right 2011   [place]   WISDOM TOOTH EXTRACTION      Current Medications: Current Meds  Medication Sig   acetaminophen (TYLENOL) 325 MG tablet Take 2 tablets (650 mg total) by mouth every 6 (six) hours as needed for moderate pain.   aspirin 81 MG chewable tablet Chew 1 tablet (81 mg total) by mouth daily.   calcium carbonate (OS-CAL - DOSED IN MG OF ELEMENTAL CALCIUM) 1250 (500 Ca) MG tablet Take 1 tablet by mouth daily  with supper.   Cholecalciferol (VITAMIN D) 2000 UNITS tablet Take 1 tablet (2,000 Units total) by mouth daily.   clonazePAM (KLONOPIN) 1 MG tablet Take 1 tablet (1 mg total) by mouth 2 (two) times daily.   DULoxetine (CYMBALTA) 30 MG capsule Take 1 capsule (30 mg total) by mouth daily.   finasteride (PROSCAR) 5 MG tablet Take 1 tablet (5 mg total) by mouth daily. (Patient taking differently: Take 5 mg by mouth every morning.)   levothyroxine (SYNTHROID) 88 MCG tablet TAKE 1 TABLET BY MOUTH EVERY DAY BEFORE BREAKFAST   Multiple Vitamin (MULTIVITAMIN WITH MINERALS) TABS tablet Take 1 tablet by mouth in the morning.   ondansetron (ZOFRAN) 4 MG tablet TAKE 1 TABLET(4 MG) BY MOUTH EVERY 8 HOURS AS NEEDED FOR NAUSEA OR VOMITING   oxyCODONE (ROXICODONE) 5 MG immediate release tablet Take 1 tablet (5  mg total) by mouth every 6 (six) hours as needed for severe pain (chronic back pain). MDD 4   predniSONE (DELTASONE) 5 MG tablet TAKE 1 TABLET BY MOUTH EVERY DAY WITH BREAKFAST   sucralfate (CARAFATE) 1 GM/10ML suspension Take 1 g by mouth in the morning.   tamsulosin (FLOMAX) 0.4 MG CAPS capsule TAKE 1 CAPSULE BY MOUTH EVERY DAY AFTER SUPPER (Patient taking differently: Take 0.4 mg by mouth in the morning.)   [DISCONTINUED] clopidogrel (PLAVIX) 75 MG tablet Take 1 tablet (75 mg total) by mouth daily with breakfast.   [DISCONTINUED] pantoprazole (PROTONIX) 40 MG tablet Take 1 tablet (40 mg total) by mouth daily. Please follow up with primary care provider for additional refills.   [DISCONTINUED] rosuvastatin (CRESTOR) 20 MG tablet Take 1 tablet (20 mg total) by mouth daily.   [DISCONTINUED] valsartan (DIOVAN) 40 MG tablet Take 0.5 tablets (20 mg total) by mouth daily.     Allergies:   Patient has no known allergies.   Social History   Socioeconomic History   Marital status: Married    Spouse name: Not on file   Number of children: 2   Years of education: Not on file   Highest education level: Some  college, no degree  Occupational History   Occupation: general contractor-retired  Tobacco Use   Smoking status: Former    Current packs/day: 0.00    Types: Cigarettes    Quit date: 02/03/1963    Years since quitting: 59.6   Smokeless tobacco: Never  Vaping Use   Vaping status: Never Used  Substance and Sexual Activity   Alcohol use: Not Currently    Comment: wine 2x/week   Drug use: Never   Sexual activity: Not on file  Other Topics Concern   Not on file  Social History Narrative   Lives with wife   Retired - Geologist, 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 [employer] of [employer] investment banker Strain: Low Risk  (07/21/2022)   Overall Financial Resource Strain (CARDIA)    Difficulty of Paying Living Expenses: Not hard at all  Food Insecurity: No Food Insecurity (07/21/2022)   Hunger Vital Sign    Worried About Running Out of Food in the Last Year: Never true    Ran Out of Food in the Last Year: Never true  Transportation Needs: No Transportation Needs (07/21/2022)   PRAPARE - Administrator, Civil Service (Medical): No    Lack of Transportation (Non-Medical): No  Physical Activity: Sufficiently Active (07/21/2022)   Exercise Vital Sign    Days of Exercise per Week: 5 days    Minutes of Exercise per Session: 30 min  Stress: No Stress Concern Present (07/21/2022)   [employer] of Occupational Health - Occupational Stress Questionnaire    Feeling of Stress : Not at all  Social Connections: Moderately Isolated (07/21/2022)   Social Connection and Isolation Panel [NHANES]    Frequency of Communication with Friends and Family: More than three times a week    Frequency of Social Gatherings with Friends and Family: Once a week    Attends Religious Services: Never    Database administrator or Organizations: No    Attends Engineer, structural: Never    Marital Status: Married     Family History: The patient's family history  includes Breast cancer in his sister; Hypertension in his father; Macular degeneration in his mother; Melanoma in his brother, mother, and sister;  Stomach cancer (age of onset: 69) in his sister; Uterine cancer in his sister. There is no history of Colon polyps, Colon cancer, Esophageal cancer, or Rectal cancer.  ROS:   Please see the history of present illness.    + shortness of breath All other systems reviewed and are negative.  Labs/Other Studies Reviewed:    The following studies were reviewed today:  [facility] 08/13/22 Mid Cx to Dist Cx lesion is 40% stenosed.   Prox LAD lesion is 95% stenosed.   1st Mrg lesion is 40% stenosed.   A drug-eluting stent was successfully placed using a STENT ONYX FRONTIER 3.5X15.   Post intervention, there is a 0% residual stenosis.   Severe proximal LAD stenosis Successful PTCA/DES x 1 proximal LAD Moderate non-obstructive disease in the first obtuse marginal ostium and in the distal Circumflex No disease in the large dominant RCA   Recommendations: DAPT with ASA/Plavix for at least six months. Change Prilosec to Protonix given use of Plavix.   Eugenie Birks Myoview 06/19/22   Findings are consistent with infarction with peri-infarct ischemia. The study is intermediate risk.   No ST deviation was noted.   LV perfusion is abnormal. Defect 1: There is a medium defect with severe reduction in uptake present in the apical to basal inferior and inferoseptal location(s) that is partially reversible. There is abnormal wall motion in the defect area. Consistent with infarction and peri-infarct ischemia.   Left ventricular function is abnormal. Global function is moderately reduced. Nuclear stress EF: 36 %. The left ventricular ejection fraction is moderately decreased (30-44%). End diastolic cavity size is mildly enlarged. End systolic cavity size is normal.   Prior study available for comparison from 08/24/2018. Inferoseptal infarct, LVEF 44%   Medium size, severe  intensity mostly fixed (SDS 3) defect of the basal to apical inferior wall and mid-apical inferoseptal walls, consistent with infarct and minimal peri-infarct ischemia. LVEF 36% with inferior and inferoseptal akinesis. This is an intermediate risk study. Compared to a prior study in 2020, the fixed defect, which is suspected to be a scar, is unchanged. LVEF is now reported to be lower - echo correlation is recommended.   Echo 06/19/22 1. Left ventricular ejection fraction, by estimation, is 30 to 35%. The  left ventricle has moderately decreased function. The left ventricle has  no regional wall motion abnormalities. Left ventricular diastolic  parameters are consistent with Grade I  diastolic dysfunction (impaired relaxation).   2. Right ventricular systolic function is normal. The right ventricular  size is normal.   3. The mitral valve is normal in structure. Mild mitral valve  regurgitation. No evidence of mitral stenosis.   4. The aortic valve is normal in structure. There is mild calcification  of the aortic valve. There is mild thickening of the aortic valve. Aortic  valve regurgitation is not visualized. No aortic stenosis is present.   5. Aortic dilatation noted. There is borderline dilatation of the  ascending aorta, measuring 38 mm.   6. The inferior vena cava is normal in size with greater than 50%  respiratory variability, suggesting right atrial pressure of 3 mmHg.   CTA Chest PE 05/20/22 IMPRESSION: 1. Technically adequate exam showing no acute pulmonary embolus. 2. Coronary artery calcifications. 3. Prior cholecystectomy and gastric bypass.   Recent Labs: 05/16/2022: ALT 17; TSH 0.449 08/04/2022: BUN 14; Creatinine, Ser 0.90; Platelets 210 08/13/2022: Hemoglobin 8.8; Potassium 3.4; Sodium 151  Recent Lipid Panel    Component Value Date/Time   CHOL 195  01/07/2017 1034   TRIG 152.0 (H) 01/07/2017 1034   HDL 73.20 01/07/2017 1034   CHOLHDL 3 01/07/2017 1034   VLDL 30.4  01/07/2017 1034   LDLCALC 91 01/07/2017 1034     Risk Assessment/Calculations:           Physical Exam:    VS:  BP 122/78 (BP Location: Right Arm, Patient Position: Sitting, Cuff Size: Normal)   Pulse 62   Ht 5\' 11"  (1.803 m)   Wt 180 lb 1 oz (81.7 kg)   SpO2 100%   BMI 25.11 kg/m     Wt Readings from Last 3 Encounters:  09/02/22 180 lb 1 oz (81.7 kg)  08/13/22 176 lb (79.8 kg)  08/04/22 181 lb (82.1 kg)     GEN:  Well nourished, well developed in no acute distress HEENT: Normal NECK: No JVD; No carotid bruits CARDIAC: Irregular RR, no murmurs, rubs, gallops RESPIRATORY:  Clear to auscultation without rales, wheezing or rhonchi  ABDOMEN: Soft, non-tender, non-distended MUSCULOSKELETAL:  No edema; No deformity. 2+ pedal pulses, equal bilaterally SKIN: Warm and dry NEUROLOGIC:  Alert and oriented x 3 PSYCHIATRIC:  Normal affect   EKG:  EKG Interpretation Date/Time:  Wednesday September 02 2022 08:11:25 EDT Ventricular Rate:  62 PR Interval:  256 QRS Duration:  94 QT Interval:  398 QTC Calculation: 403 R Axis:   -14  Text Interpretation: Sinus rhythm with 1st degree A-V block Minimal voltage criteria for LVH, may be normal variant ( R in aVL ) ST & T wave abnormality, consider lateral ischemia When compared with ECG of 13-Aug-2022 10:05, Sinus rhythm has replaced Junctional rhythm QRS axis Shifted right T wave inversion now evident in Inferior leads QT has shortened Confirmed by Eligha Bridegroom [phone]) on 09/02/2022 8:55:26 AM    Diagnoses:    1. Coronary artery disease of native artery of native heart with stable angina pectoris (HCC)   2. Frequent PVCs   3. Essential hypertension   4. Nonrheumatic mitral valve regurgitation   5. Hyperlipidemia LDL goal <70   6. Chronic combined systolic and diastolic heart failure (HCC)   7. DOE (dyspnea on exertion)   8. Adrenal insufficiency due to cancer therapy (HCC)   9. S/P drug eluting coronary stent placement       Assessment and Plan:     DOE/Chronic combined CHF: Echo 06/19/2022 revealed newly reduced LVEF 30 to 35%, G1 DD.  He has not been able to tolerate low-dose Entresto, carvedilol, or valsartan. Found to have 95% LAD stenosis successfully treated with PTCA/DES x 1.  He initially felt very well s/p stenting, but over the last 1-2 weeks, shortness of breath/DOE has returned. No weight gain, orthopnea, PND, edema. He remains physically active, eats a healthy diet, and limits fluid intake. We will try to get a repeat limited echo as soon as possible. Would favor addition of SGLT2i if EF remains reduced.   CAD with angina: Initially presented with SOB/DOE 05/20/22, found to have LVEF 30-35% and evidence of infarction and ischemia on NST 06/2022. He underwent LHC on 08/13/22 which revealed severe proximal LAD stenosis successfully treated with PTCA/DES x 1, moderate non-obstructive disease as outlined above. EKG today shows TWI inferior leads II, aVF,  V4-6. He initially felt significant improvement following DES placement, however over the past 2 weeks, DOE has returned and occurs with mild to moderate activity.  As noted above, we will repeat limited echo. Consider GDMT for angina/CHF, although this may be somewhat limited due  to previous intolerances and soft BP.  No bleeding concerns on aspirin and Plavix.  Continue rosuvastatin, Plavix, aspirin, pantoprazole.  Frequent PVCs: History of frequent PVCs on EKG; he was asymptomatic. He did not tolerate low dose carvedilol. Quiescent at this time.    Hypertension: He has significant hypotension on low doses of Entresto, carvedilol, and losartan. Trial of valsartan which he seemed to tolerate but had hyperkalemia during admission. BP is stable today. We will recheck BMET for further medication titration.   Mitral regurgitation: Mild MR on echo 06/19/2022. Continue to monitor clinically for now.   Adrenal insufficiency: On chronic prednisone 5 mg daily. No acute  concerns today. Management per endocrinology.   Hyperlipidemia LDL goal < 70: No recent cholesterol panel to review. Now on rosuvastatin since hospital discharge. We will recheck fasting lipid panel and CMET in 2 months, prior to appointment with Dr. Clifton James.      Disposition: Keep your October appointment with Dr. Clifton James  Medication Adjustments/Labs and Tests Ordered: Current medicines are reviewed at length with the patient today.  Concerns regarding medicines are outlined above.  Orders Placed This Encounter  Procedures   Comp Met (CMET)   EKG 12-Lead   ECHOCARDIOGRAM LIMITED   Meds ordered this encounter  Medications   rosuvastatin (CRESTOR) 20 MG tablet    Sig: Take 1 tablet (20 mg total) by mouth daily.    Dispense:  90 tablet    Refill:  3   pantoprazole (PROTONIX) 40 MG tablet    Sig: Take 1 tablet (40 mg total) by mouth daily. Please follow up with primary care provider for additional refills.    Dispense:  90 tablet    Refill:  3   clopidogrel (PLAVIX) 75 MG tablet    Sig: Take 1 tablet (75 mg total) by mouth daily with breakfast.    Dispense:  90 tablet    Refill:  3    Patient Instructions  Medication Instructions:   Your physician recommends that you continue on your current medications as directed. Please refer to the Current Medication list given to you today.   *If you need a refill on your cardiac medications before your next appointment, please call your pharmacy*   Lab Work:  Your physician recommends that you return for a FASTING lipid profile/lipo a/cmet on Wednesday, October 2. You can come in on the day of your appointment anytime between 7:30-4:30 fasting from midnight the night before.     If you have labs (blood work) drawn today and your tests are completely normal, you will receive your results only by: MyChart Message (if you have MyChart) OR A paper copy in the mail If you have any lab test that is abnormal or we need to change your  treatment, we will call you to review the results.   Testing/Procedures: STAT FOR POST PCI with SOB.  Your physician has requested that you have an echocardiogram. Echocardiography is a painless test that uses sound waves to create images of your heart. It provides your doctor with information about the size and shape of your heart and how well your heart's chambers and valves are working. This procedure takes approximately one hour. There are no restrictions for this procedure. Please do NOT wear cologne, aftershave, or lotions (deodorant is allowed). Please arrive 15 minutes prior to your appointment time.    Follow-Up: At [facility], you and your health needs are our priority.  As part of our continuing mission to provide you  with exceptional heart care, we have created designated Provider Care Teams.  These Care Teams include your primary Cardiologist (physician) and Advanced Practice Providers (APPs -  Physician Assistants and Nurse Practitioners) who all work together to provide you with the care you need, when you need it.  We recommend signing up for the patient portal called "MyChart".  Sign up information is provided on this After Visit Summary.  MyChart is used to connect with patients for Virtual Visits (Telemedicine).  Patients are able to view lab/test results, encounter notes, upcoming appointments, etc.  Non-urgent messages can be sent to your provider as well.   To learn more about what you can do with MyChart, go to [email or web address].    Your next appointment:   3 month(s)  Provider:   Verne Carrow, MD       Signed, Levi Aland, NP  09/02/2022 10:42 AM    [facility]

## 2022-09-02 ENCOUNTER — Ambulatory Visit (HOSPITAL_BASED_OUTPATIENT_CLINIC_OR_DEPARTMENT_OTHER): Payer: Medicare Other

## 2022-09-02 ENCOUNTER — Encounter: Payer: Self-pay | Admitting: Nurse Practitioner

## 2022-09-02 ENCOUNTER — Ambulatory Visit: Payer: Medicare Other | Admitting: Nurse Practitioner

## 2022-09-02 ENCOUNTER — Ambulatory Visit: Payer: Medicare Other

## 2022-09-02 ENCOUNTER — Telehealth: Payer: Self-pay | Admitting: *Deleted

## 2022-09-02 ENCOUNTER — Other Ambulatory Visit: Payer: Self-pay | Admitting: *Deleted

## 2022-09-02 VITALS — BP 122/78 | HR 62 | Ht 71.0 in | Wt 180.1 lb

## 2022-09-02 DIAGNOSIS — Z955 Presence of coronary angioplasty implant and graft: Secondary | ICD-10-CM | POA: Insufficient documentation

## 2022-09-02 DIAGNOSIS — E785 Hyperlipidemia, unspecified: Secondary | ICD-10-CM | POA: Insufficient documentation

## 2022-09-02 DIAGNOSIS — E875 Hyperkalemia: Secondary | ICD-10-CM

## 2022-09-02 DIAGNOSIS — I493 Ventricular premature depolarization: Secondary | ICD-10-CM | POA: Diagnosis not present

## 2022-09-02 DIAGNOSIS — I25118 Atherosclerotic heart disease of native coronary artery with other forms of angina pectoris: Secondary | ICD-10-CM | POA: Diagnosis not present

## 2022-09-02 DIAGNOSIS — I10 Essential (primary) hypertension: Secondary | ICD-10-CM | POA: Insufficient documentation

## 2022-09-02 DIAGNOSIS — I5042 Chronic combined systolic (congestive) and diastolic (congestive) heart failure: Secondary | ICD-10-CM | POA: Insufficient documentation

## 2022-09-02 DIAGNOSIS — R0609 Other forms of dyspnea: Secondary | ICD-10-CM | POA: Insufficient documentation

## 2022-09-02 DIAGNOSIS — E273 Drug-induced adrenocortical insufficiency: Secondary | ICD-10-CM | POA: Insufficient documentation

## 2022-09-02 DIAGNOSIS — I340 Nonrheumatic mitral (valve) insufficiency: Secondary | ICD-10-CM | POA: Diagnosis not present

## 2022-09-02 LAB — ECHOCARDIOGRAM LIMITED
Area-P 1/2: 3.27 cm2
Height: 71 in
S' Lateral: 2.5 cm
Weight: 2880.96 oz

## 2022-09-02 MED ORDER — CLOPIDOGREL BISULFATE 75 MG PO TABS: 75.0000 mg | ORAL_TABLET | Freq: Every day | ORAL | 3 refills | Status: DC

## 2022-09-02 MED ORDER — ROSUVASTATIN CALCIUM 20 MG PO TABS: 20.0000 mg | ORAL_TABLET | Freq: Every day | ORAL | 3 refills | Status: DC

## 2022-09-02 MED ORDER — PANTOPRAZOLE SODIUM 40 MG PO TBEC: 40.0000 mg | DELAYED_RELEASE_TABLET | Freq: Every day | ORAL | 3 refills | Status: DC

## 2022-09-02 NOTE — Telephone Encounter (Signed)
Lvm per MSW when pt comes back for limited echo today that pt needs to get a BMET drawn. Orders placed in system and appt made and linked.

## 2022-09-02 NOTE — Patient Instructions (Signed)
Medication Instructions:   Your physician recommends that you continue on your current medications as directed. Please refer to the Current Medication list given to you today.   *If you need a refill on your cardiac medications before your next appointment, please call your pharmacy*   Lab Work:  Your physician recommends that you return for a FASTING lipid profile/lipo a/cmet on Wednesday, October 2. You can come in on the day of your appointment anytime between 7:30-4:30 fasting from midnight the night before.     If you have labs (blood work) drawn today and your tests are completely normal, you will receive your results only by: MyChart Message (if you have MyChart) OR A paper copy in the mail If you have any lab test that is abnormal or we need to change your treatment, we will call you to review the results.   Testing/Procedures: STAT FOR POST PCI with SOB.  Your physician has requested that you have an echocardiogram. Echocardiography is a painless test that uses sound waves to create images of your heart. It provides your doctor with information about the size and shape of your heart and how well your heart's chambers and valves are working. This procedure takes approximately one hour. There are no restrictions for this procedure. Please do NOT wear cologne, aftershave, or lotions (deodorant is allowed). Please arrive 15 minutes prior to your appointment time.    Follow-Up: At [facility], you and your health needs are our priority.  As part of our continuing mission to provide you with exceptional heart care, we have created designated Provider Care Teams.  These Care Teams include your primary Cardiologist (physician) and Advanced Practice Providers (APPs -  Physician Assistants and Nurse Practitioners) who all work together to provide you with the care you need, when you need it.  We recommend signing up for the patient portal called "MyChart".  Sign up information  is provided on this After Visit Summary.  MyChart is used to connect with patients for Virtual Visits (Telemedicine).  Patients are able to view lab/test results, encounter notes, upcoming appointments, etc.  Non-urgent messages can be sent to your provider as well.   To learn more about what you can do with MyChart, go to [email or web address].    Your next appointment:   3 month(s)  Provider:   Verne Carrow, MD

## 2022-09-07 ENCOUNTER — Other Ambulatory Visit: Payer: Self-pay | Admitting: *Deleted

## 2022-09-07 DIAGNOSIS — E875 Hyperkalemia: Secondary | ICD-10-CM

## 2022-09-14 ENCOUNTER — Encounter: Payer: Self-pay | Admitting: Internal Medicine

## 2022-09-14 MED ORDER — OXYCODONE HCL 5 MG PO TABS: 5.0000 mg | ORAL_TABLET | Freq: Four times a day (QID) | ORAL | 0 refills | Status: DC | PRN

## 2022-09-19 ENCOUNTER — Other Ambulatory Visit: Payer: Self-pay | Admitting: Internal Medicine

## 2022-09-19 MED ORDER — CLONAZEPAM 1 MG PO TABS: 1.0000 mg | ORAL_TABLET | Freq: Two times a day (BID) | ORAL | 5 refills | Status: DC

## 2022-09-19 NOTE — Addendum Note (Signed)
Addended by: Pincus Sanes on: 09/19/2022 03:24 PM   Modules accepted: Orders

## 2022-10-04 ENCOUNTER — Encounter: Payer: Self-pay | Admitting: Cardiovascular Disease

## 2022-10-06 NOTE — Telephone Encounter (Signed)
[name], MD     I would have him stop the Valsartan and Crestor for one month and see if his energy returns to normal. Maxwell Fox

## 2022-10-09 ENCOUNTER — Other Ambulatory Visit: Payer: Self-pay | Admitting: Internal Medicine

## 2022-10-12 ENCOUNTER — Encounter: Payer: Self-pay | Admitting: Internal Medicine

## 2022-10-12 MED ORDER — OXYCODONE HCL 5 MG PO TABS: 5.0000 mg | ORAL_TABLET | Freq: Four times a day (QID) | ORAL | 0 refills | Status: DC | PRN

## 2022-11-04 ENCOUNTER — Ambulatory Visit: Payer: Medicare Other | Attending: Nurse Practitioner

## 2022-11-04 DIAGNOSIS — R9439 Abnormal result of other cardiovascular function study: Secondary | ICD-10-CM

## 2022-11-04 DIAGNOSIS — I2510 Atherosclerotic heart disease of native coronary artery without angina pectoris: Secondary | ICD-10-CM | POA: Diagnosis not present

## 2022-11-04 DIAGNOSIS — I5043 Acute on chronic combined systolic (congestive) and diastolic (congestive) heart failure: Secondary | ICD-10-CM

## 2022-11-04 DIAGNOSIS — I25118 Atherosclerotic heart disease of native coronary artery with other forms of angina pectoris: Secondary | ICD-10-CM

## 2022-11-05 LAB — COMPREHENSIVE METABOLIC PANEL
ALT: 17 [IU]/L (ref 0–44)
AST: 27 [IU]/L (ref 0–40)
Albumin: 3.8 g/dL (ref 3.8–4.8)
Alkaline Phosphatase: 55 [IU]/L (ref 44–121)
BUN/Creatinine Ratio: 12 (ref 10–24)
BUN: 13 mg/dL (ref 8–27)
Bilirubin Total: 0.4 mg/dL (ref 0.0–1.2)
CO2: 24 mmol/L (ref 20–29)
Calcium: 8.8 mg/dL (ref 8.6–10.2)
Chloride: 109 mmol/L — ABNORMAL HIGH (ref 96–106)
Creatinine, Ser: 1.1 mg/dL (ref 0.76–1.27)
Globulin, Total: 2 g/dL (ref 1.5–4.5)
Glucose: 89 mg/dL (ref 70–99)
Potassium: 5 mmol/L (ref 3.5–5.2)
Sodium: 144 mmol/L (ref 134–144)
Total Protein: 5.8 g/dL — ABNORMAL LOW (ref 6.0–8.5)
eGFR: 69 mL/min/{1.73_m2} (ref >59)

## 2022-11-05 LAB — LIPID PANEL
Chol/HDL Ratio: 3.4 {ratio} (ref 0.0–5.0)
Cholesterol, Total: 221 mg/dL — ABNORMAL HIGH (ref 100–199)
HDL: 65 mg/dL (ref >39)
LDL Chol Calc (NIH): 132 mg/dL — ABNORMAL HIGH (ref 0–99)
Triglycerides: 134 mg/dL (ref 0–149)
VLDL Cholesterol Cal: 24 mg/dL (ref 5–40)

## 2022-11-05 LAB — LIPOPROTEIN A (LPA): Lipoprotein (a): <8.4 nmol/L (ref <75.0)

## 2022-11-05 NOTE — Progress Notes (Signed)
Chief Complaint  Patient presents with   Follow-up    CAD   History of Present Illness: 77 yo male with history of CAD, mitral regurgitation, sinus bradycardia, BPH, HTN, HLD, iron deficiency anemia, melanoma with brain mets, obesity s/p gastric bypass who is here today for follow up. I met him in March 2021 as a new patient for evaluation of bradycardia. He had been followed by Dr. Rinaldo Cloud who saw him last in October 2020. He had an episode of dizziness in July 2020 and his heart rate was in the 30s. He was seen in the ED and had PVCs with bigeminy. He had an echo and a stress test in Dr. Annitta Jersey office. Echo 08/30/18: LVEF=50%. Grade 1 diastolic dysfunction. Mild LVH. Mild MR. Mild AS. Nuclear stress test 08/24/18:No ischemia. He has metastatic melanoma and has had treatment at [facility] and is in remission. He had a tumor removal and radiation. He has adrenal insufficiency post immunotherapy and is on daily Prednisone. He was asymptomatic when I saw him in 2021. He was admitted to [facility] February 2022 with upper GI bleeding due to AVMs. He was seen in the ED in April 2024 and had c/o weakness and headache. Brain MRI showed chronic temporal lobe lesion with mild edema. Seen in clinic by Jacolyn Reedy, PA on 05/20/22 at which time he continued to have DOE and shortness of breath at rest and with talking. CT ordered to rule out PE. He was diagnosed with COVID-19 after testing at home due to symptoms during clinic visit. Echo 06/19/22 revealed LVEF 30-35%, G1DD, normal RV function and size, mild MR, mild calcification of aortic valve with no aortic stenosis, borderline dilatation of ascending aorta, measuring 38 mm. Nuclear stress test revealed findings consistent with infarction with peri-infarct ischemia, intermediate risk. Medium defect with severe reduction in uptake present in the apical to basal inferior and inferior septal location that is partially reversible, abnormal wall motion in this area,  consistent with infarction and peri-infarct ischemia. Cardiac cath July 2024 with severe proximal LAD stenosis treated with a drug eluting stent. Mild to moderate disease in the Circumflex. Limited echo May 2024 with improvement in LVEF to 50%. He did not tolerate low dose Entresto, Coreg or Valsartan. He did not tolerate Crestor due to muscle aches.   He is here today for follow up. The patient denies any chest pain, dyspnea, palpitations, lower extremity edema, orthopnea, PND, dizziness, near syncope or syncope.   Primary Care Physician: Pincus Sanes, MD   Past Medical History:  Diagnosis Date   B12 nutritional deficiency    malabsorbtion   Blood transfusion without reported diagnosis 2022   BPH (benign prostatic hypertrophy)    Brain cancer (HCC) 2017   removed "golf ball sized mass"   Depression    on meds   Diverticulosis    GERD (gastroesophageal reflux disease)    on meds   GI bleeding 02/2020   Hypertension    Iron deficiency anemia    malabsorption related (s/p gastric bypass)   Melanoma (HCC)    Obesity    s/p gastric bypass 2000, start 355#   Pancreatic cyst    Pulmonary nodules    Thyroid disease    on meds    Past Surgical History:  Procedure Laterality Date   CHOLECYSTECTOMY  2000   COLONOSCOPY WITH PROPOFOL N/A 03/05/2020   Procedure: COLONOSCOPY WITH PROPOFOL;  Surgeon: Lemar Lofty., MD;  Location: [facility] ENDOSCOPY;  Service: Gastroenterology;  Laterality:  N/A;   CORONARY STENT INTERVENTION N/A 08/13/2022   Procedure: CORONARY STENT INTERVENTION;  Surgeon: [name], MD;  Location: [facility] INVASIVE CV LAB;  Service: Cardiovascular;  Laterality: N/A;   CRANIOTOMY FOR TUMOR  12/11/2015   metastatic melanoma   ENTEROSCOPY N/A 03/30/2020   Procedure: ENTEROSCOPY;  Surgeon: Hilarie Fredrickson, MD;  Location: [facility] ENDOSCOPY;  Service: Endoscopy;  Laterality: N/A;   ESOPHAGOGASTRODUODENOSCOPY N/A 03/30/2020   Procedure: ESOPHAGOGASTRODUODENOSCOPY (EGD);   Surgeon: Hilarie Fredrickson, MD;  Location: [facility] ENDOSCOPY;  Service: Endoscopy;  Laterality: N/A;   ESOPHAGOGASTRODUODENOSCOPY (EGD) WITH PROPOFOL N/A 02/14/2020   Procedure: ESOPHAGOGASTRODUODENOSCOPY (EGD) WITH PROPOFOL;  Surgeon: Shellia Cleverly, DO;  Location: [facility] ENDOSCOPY;  Service: Endoscopy;  Laterality: N/A;   ESOPHAGOGASTRODUODENOSCOPY (EGD) WITH PROPOFOL N/A 02/26/2020   Procedure: ESOPHAGOGASTRODUODENOSCOPY (EGD) WITH PROPOFOL;  Surgeon: Hilarie Fredrickson, MD;  Location: [facility] ENDOSCOPY;  Service: Endoscopy;  Laterality: N/A;   ESOPHAGOGASTRODUODENOSCOPY (EGD) WITH PROPOFOL N/A 03/03/2020   Procedure: ESOPHAGOGASTRODUODENOSCOPY (EGD) WITH PROPOFOL;  Surgeon: Jeani Hawking, MD;  Location: [facility] ENDOSCOPY;  Service: Endoscopy;  Laterality: N/A;   ESOPHAGOGASTRODUODENOSCOPY (EGD) WITH PROPOFOL N/A 03/28/2020   Procedure: ESOPHAGOGASTRODUODENOSCOPY (EGD) WITH PROPOFOL;  Surgeon: Hilarie Fredrickson, MD;  Location: [facility] ENDOSCOPY;  Service: Endoscopy;  Laterality: N/A;   FOREIGN BODY REMOVAL  03/30/2020   Procedure: FOREIGN BODY REMOVAL;  Surgeon: Hilarie Fredrickson, MD;  Location: [facility] ENDOSCOPY;  Service: Endoscopy;;   GIVENS CAPSULE STUDY N/A 03/28/2020   Procedure: GIVENS CAPSULE STUDY;  Surgeon: Hilarie Fredrickson, MD;  Location: [facility] ENDOSCOPY;  Service: Endoscopy;  Laterality: N/A;   HEMOSTASIS CONTROL  02/26/2020   Procedure: HEMOSTASIS CONTROL;  Surgeon: Hilarie Fredrickson, MD;  Location: [facility] ENDOSCOPY;  Service: Endoscopy;;   HEMOSTASIS CONTROL  03/30/2020   Procedure: HEMOSTASIS CONTROL;  Surgeon: Hilarie Fredrickson, MD;  Location: [facility] ENDOSCOPY;  Service: Endoscopy;;   HOT HEMOSTASIS N/A 02/26/2020   Procedure: HOT HEMOSTASIS (ARGON PLASMA COAGULATION/BICAP);  Surgeon: Hilarie Fredrickson, MD;  Location: [facility] ENDOSCOPY;  Service: Endoscopy;  Laterality: N/A;   MOHS SURGERY  2012   melanoma, mid back, [place]   RIGHT/LEFT HEART CATH AND CORONARY ANGIOGRAPHY N/A 08/13/2022   Procedure: RIGHT/LEFT HEART CATH AND CORONARY ANGIOGRAPHY;   Surgeon: [name], MD;  Location: [facility] INVASIVE CV LAB;  Service: Cardiovascular;  Laterality: N/A;   ROUX-EN-Y PROCEDURE  2000   TOTAL SHOULDER REPLACEMENT Right 2011   [place]   WISDOM TOOTH EXTRACTION      Current Outpatient Medications  Medication Sig Dispense Refill   acetaminophen (TYLENOL) 325 MG tablet Take 2 tablets (650 mg total) by mouth every 6 (six) hours as needed for moderate pain.     aspirin 81 MG chewable tablet Chew 1 tablet (81 mg total) by mouth daily. 90 tablet 3   calcium carbonate (OS-CAL - DOSED IN MG OF ELEMENTAL CALCIUM) 1250 (500 Ca) MG tablet Take 1 tablet by mouth daily with supper.     Cholecalciferol (VITAMIN D) 2000 UNITS tablet Take 1 tablet (2,000 Units total) by mouth daily. 30 tablet 11   clonazePAM (KLONOPIN) 1 MG tablet Take 1 tablet (1 mg total) by mouth 2 (two) times daily. 60 tablet 5   clopidogrel (PLAVIX) 75 MG tablet Take 1 tablet (75 mg total) by mouth daily with breakfast. 90 tablet 3   DULoxetine (CYMBALTA) 30 MG capsule Take 1 capsule (30 mg total) by mouth daily. 90 capsule 1   finasteride (PROSCAR) 5 MG tablet Take 1 tablet (5  mg total) by mouth daily. (Patient taking differently: Take 5 mg by mouth every morning.) 90 tablet 1   levothyroxine (SYNTHROID) 88 MCG tablet TAKE 1 TABLET BY MOUTH EVERY DAY BEFORE BREAKFAST 45 tablet 1   Multiple Vitamin (MULTIVITAMIN WITH MINERALS) TABS tablet Take 1 tablet by mouth in the morning.     ondansetron (ZOFRAN) 4 MG tablet TAKE 1 TABLET(4 MG) BY MOUTH EVERY 8 HOURS AS NEEDED FOR NAUSEA OR VOMITING 90 tablet 1   oxyCODONE (ROXICODONE) 5 MG immediate release tablet Take 1 tablet (5 mg total) by mouth every 6 (six) hours as needed for severe pain (chronic back pain). MDD 4 120 tablet 0   pantoprazole (PROTONIX) 40 MG tablet Take 1 tablet (40 mg total) by mouth daily. Please follow up with primary care provider for additional refills. 90 tablet 3   predniSONE (DELTASONE) 5 MG tablet TAKE 1 TABLET  BY MOUTH EVERY DAY WITH BREAKFAST 90 tablet 3   simvastatin (ZOCOR) 20 MG tablet Take 1 tablet (20 mg total) by mouth at bedtime. 90 tablet 3   sucralfate (CARAFATE) 1 GM/10ML suspension Take 1 g by mouth in the morning.     tamsulosin (FLOMAX) 0.4 MG CAPS capsule TAKE 1 CAPSULE BY MOUTH EVERY DAY AFTER SUPPER (Patient taking differently: Take 0.4 mg by mouth in the morning.) 90 capsule 1   No current facility-administered medications for this visit.    No Known Allergies  Social History   Socioeconomic History   Marital status: Married    Spouse name: Not on file   Number of children: 2   Years of education: Not on file   Highest education level: Some college, no degree  Occupational History   Occupation: general contractor-retired  Tobacco Use   Smoking status: Former    Current packs/day: 0.00    Types: Cigarettes    Quit date: 02/03/1963    Years since quitting: 59.7   Smokeless tobacco: Never  Vaping Use   Vaping status: Never Used  Substance and Sexual Activity   Alcohol use: Not Currently    Comment: wine 2x/week   Drug use: Never   Sexual activity: Not on file  Other Topics Concern   Not on file  Social History Narrative   Lives with wife   Retired - Geologist, 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 [employer] of Corporate investment banker Strain: Low Risk  (07/21/2022)   Overall Financial Resource Strain (CARDIA)    Difficulty of Paying Living Expenses: Not hard at all  Food Insecurity: No Food Insecurity (07/21/2022)   Hunger Vital Sign    Worried About Running Out of Food in the Last Year: Never true    Ran Out of Food in the Last Year: Never true  Transportation Needs: No Transportation Needs (07/21/2022)   PRAPARE - Administrator, Civil Service (Medical): No    Lack of Transportation (Non-Medical): No  Physical Activity: Sufficiently Active (07/21/2022)   Exercise Vital Sign    Days of Exercise per Week: 5 days     Minutes of Exercise per Session: 30 min  Stress: No Stress Concern Present (07/21/2022)   [employer] of Occupational Health - Occupational Stress Questionnaire    Feeling of Stress : Not at all  Social Connections: Moderately Isolated (07/21/2022)   Social Connection and Isolation Panel [NHANES]    Frequency of Communication with Friends and Family: More than three times a week  Frequency of Social Gatherings with Friends and Family: Once a week    Attends Religious Services: Never    Database administrator or Organizations: No    Attends Banker Meetings: Never    Marital Status: Married  Catering manager Violence: Not At Risk (12/10/2020)   Humiliation, Afraid, Rape, and Kick questionnaire    Fear of Current or Ex-Partner: No    Emotionally Abused: No    Physically Abused: No    Sexually Abused: No    Family History  Problem Relation Age of Onset   Hypertension Father    Macular degeneration Mother    Melanoma Mother    Melanoma Sister    Stomach cancer Sister 60       duodenal   Breast cancer Sister    Uterine cancer Sister    Melanoma Brother    Colon polyps Neg Hx    Colon cancer Neg Hx    Esophageal cancer Neg Hx    Rectal cancer Neg Hx     Review of Systems:  As stated in the HPI and otherwise negative.   BP 116/66   Pulse 65   Ht 5\' 11"  (1.803 m)   Wt 86.7 kg   SpO2 99%   BMI 26.67 kg/m   Physical Examination: General: Well developed, well nourished, NAD  HEENT: OP clear, mucus membranes moist  SKIN: warm, dry. No rashes. Neuro: No focal deficits  Musculoskeletal: Muscle strength 5/5 all ext  Psychiatric: Mood and affect normal  Neck: No JVD, no carotid bruits, no thyromegaly, no lymphadenopathy.  Lungs:Clear bilaterally, no wheezes, rhonci, crackles Cardiovascular: Regular rate and rhythm. No murmurs, gallops or rubs. Abdomen:Soft. Bowel sounds present. Non-tender.  Extremities: No lower extremity edema. Pulses are 2 + in the  bilateral DP/PT.  EKG:  EKG is not ordered today. The ekg ordered today demonstrates   Recent Labs: 05/16/2022: TSH 0.449 08/04/2022: Platelets 210 08/13/2022: Hemoglobin 8.8 11/04/2022: ALT 17; BUN 13; Creatinine, Ser 1.10; Potassium 5.0; Sodium 144   Lipid Panel    Component Value Date/Time   CHOL 221 (H) 11/04/2022 0813   TRIG 134 11/04/2022 0813   HDL 65 11/04/2022 0813   CHOLHDL 3.4 11/04/2022 0813   CHOLHDL 3 01/07/2017 1034   VLDL 30.4 01/07/2017 1034   LDLCALC 132 (H) 11/04/2022 0813     Wt Readings from Last 3 Encounters:  11/06/22 86.7 kg  09/02/22 81.7 kg  08/13/22 79.8 kg    Assessment and Plan:   1. CAD without angina: No chest pain suggestive of angina. Continue ASA, Plavix and statin.   2. Chronic combined systolic and diastolic CHF/Ischemic cardiomyopathy: LVEF=30-35% by echo in May 2024. His LVEF improved to 50% by limited echo 7/33/24. No volume overload on exam today. Weight is stable. He has not tolerated GDMT.   3. PVCs: No palpitations.   4. HTN: BP is well controlled. No changes  5. Mitral regurgitation: Mild by echo in May 2024.   6. Hyperlipidemia: LDL 132 11/04/22. He has been off of Crestor due to muscle aches. Will try Zocor 20 mg daily. Repeat lipids and LFTs in 12 weeks. If he fails this statin, consider Repatha or Praluent.   Labs/ tests ordered today include:   Orders Placed This Encounter  Procedures   Lipid panel   Hepatic function panel   Disposition:   F/U with me in 12 months.   Signed, Verne Carrow, MD 11/06/2022 11:10 AM    [facility] [address] [place], [place], [place]  [address] Phone: [phone]; Fax: [phone]

## 2022-11-06 ENCOUNTER — Encounter: Payer: Self-pay | Admitting: Cardiovascular Disease

## 2022-11-06 ENCOUNTER — Encounter: Payer: Self-pay | Admitting: *Deleted

## 2022-11-06 ENCOUNTER — Ambulatory Visit: Payer: Medicare Other | Attending: Cardiovascular Disease | Admitting: Cardiovascular Disease

## 2022-11-06 VITALS — BP 116/66 | HR 65 | Ht 71.0 in | Wt 191.2 lb

## 2022-11-06 DIAGNOSIS — I340 Nonrheumatic mitral (valve) insufficiency: Secondary | ICD-10-CM | POA: Insufficient documentation

## 2022-11-06 DIAGNOSIS — I10 Essential (primary) hypertension: Secondary | ICD-10-CM | POA: Insufficient documentation

## 2022-11-06 DIAGNOSIS — E785 Hyperlipidemia, unspecified: Secondary | ICD-10-CM | POA: Diagnosis not present

## 2022-11-06 DIAGNOSIS — I2510 Atherosclerotic heart disease of native coronary artery without angina pectoris: Secondary | ICD-10-CM | POA: Diagnosis not present

## 2022-11-06 DIAGNOSIS — I5042 Chronic combined systolic (congestive) and diastolic (congestive) heart failure: Secondary | ICD-10-CM | POA: Diagnosis not present

## 2022-11-06 MED ORDER — SIMVASTATIN 20 MG PO TABS: 20.0000 mg | ORAL_TABLET | Freq: Every day | ORAL | 3 refills | Status: DC

## 2022-11-06 NOTE — Patient Instructions (Signed)
Medication Instructions:  Your physician has recommended you make the following change in your medication:  1.) stop Crestor 2.) start simvastatin (Zocor) 20 mg - take one tablet daily  *If you need a refill on your cardiac medications before your next appointment, please call your pharmacy*   Lab Work: Your physician recommends that you return for lab work in: 12 weeks (lipids, liver function)   Testing/Procedures: none   Follow-Up: At [facility], you and your health needs are our priority.  As part of our continuing mission to provide you with exceptional heart care, we have created designated Provider Care Teams.  These Care Teams include your primary Cardiologist (physician) and Advanced Practice Providers (APPs -  Physician Assistants and Nurse Practitioners) who all work together to provide you with the care you need, when you need it.   Your next appointment:   12 month(s)  Provider:   Verne Carrow, MD

## 2022-11-09 ENCOUNTER — Encounter: Payer: Self-pay | Admitting: Internal Medicine

## 2022-11-09 ENCOUNTER — Other Ambulatory Visit: Payer: Self-pay | Admitting: Internal Medicine

## 2022-11-10 MED ORDER — OXYCODONE HCL 5 MG PO TABS: 5.0000 mg | ORAL_TABLET | Freq: Four times a day (QID) | ORAL | 0 refills | Status: DC | PRN

## 2022-11-19 ENCOUNTER — Encounter: Payer: Self-pay | Admitting: Internal Medicine

## 2022-11-19 NOTE — Progress Notes (Signed)
Subjective:    Patient ID: Maxwell Fox, male    DOB: Sep 20, 1945, 77 y.o.   MRN: [id number]     HPI Lawan is here for follow up of his chronic medical problems.  S/p PCI and stent.  Feels better  Last fill of oxycodone 10/8.   Pain is controlled.    Walking daily.  Started pilates.     Medications and allergies reviewed with patient and updated if appropriate.  Current Outpatient Medications on File Prior to Visit  Medication Sig Dispense Refill   acetaminophen (TYLENOL) 325 MG tablet Take 2 tablets (650 mg total) by mouth every 6 (six) hours as needed for moderate pain.     aspirin 81 MG chewable tablet Chew 1 tablet (81 mg total) by mouth daily. 90 tablet 3   calcium carbonate (OS-CAL - DOSED IN MG OF ELEMENTAL CALCIUM) 1250 (500 Ca) MG tablet Take 1 tablet by mouth daily with supper.     Cholecalciferol (VITAMIN D) 2000 UNITS tablet Take 1 tablet (2,000 Units total) by mouth daily. 30 tablet 11   clopidogrel (PLAVIX) 75 MG tablet Take 1 tablet (75 mg total) by mouth daily with breakfast. 90 tablet 3   finasteride (PROSCAR) 5 MG tablet TAKE 1 TABLET(5 MG) BY MOUTH DAILY 90 tablet 1   levothyroxine (SYNTHROID) 88 MCG tablet TAKE 1 TABLET BY MOUTH EVERY DAY BEFORE BREAKFAST 45 tablet 1   Multiple Vitamin (MULTIVITAMIN WITH MINERALS) TABS tablet Take 1 tablet by mouth in the morning.     ondansetron (ZOFRAN) 4 MG tablet TAKE 1 TABLET(4 MG) BY MOUTH EVERY 8 HOURS AS NEEDED FOR NAUSEA OR VOMITING 90 tablet 1   pantoprazole (PROTONIX) 40 MG tablet Take 1 tablet (40 mg total) by mouth daily. Please follow up with primary care provider for additional refills. 90 tablet 3   predniSONE (DELTASONE) 5 MG tablet TAKE 1 TABLET BY MOUTH EVERY DAY WITH BREAKFAST 90 tablet 3   simvastatin (ZOCOR) 20 MG tablet Take 1 tablet (20 mg total) by mouth at bedtime. 90 tablet 3   sucralfate (CARAFATE) 1 GM/10ML suspension Take 1 g by mouth in the morning.     tamsulosin (FLOMAX) 0.4 MG CAPS  capsule TAKE 1 CAPSULE BY MOUTH EVERY DAY AFTER SUPPER (Patient taking differently: Take 0.4 mg by mouth in the morning.) 90 capsule 1   No current facility-administered medications on file prior to visit.     Review of Systems  Constitutional:  Negative for fever.  Respiratory:  Negative for cough, shortness of breath and wheezing.   Cardiovascular:  Positive for leg swelling. Negative for chest pain and palpitations.  Neurological:  Negative for light-headedness and headaches.       Objective:   Vitals:   11/20/22 1043  BP: 108/74  Pulse: (!) 52  Temp: 97.9 F (36.6 C)  SpO2: 96%   BP Readings from Last 3 Encounters:  11/20/22 108/74  11/06/22 116/66  09/02/22 122/78   Wt Readings from Last 3 Encounters:  11/20/22 195 lb (88.5 kg)  11/06/22 191 lb 3.2 oz (86.7 kg)  09/02/22 180 lb 1 oz (81.7 kg)   Body mass index is 27.2 kg/m.    Physical Exam Constitutional:      General: He is not in acute distress.    Appearance: Normal appearance. He is not ill-appearing.  HENT:     Head: Normocephalic and atraumatic.  Eyes:     Conjunctiva/sclera: Conjunctivae normal.  Cardiovascular:  Rate and Rhythm: Normal rate and regular rhythm.     Heart sounds: Normal heart sounds.  Pulmonary:     Effort: Pulmonary effort is normal. No respiratory distress.     Breath sounds: Normal breath sounds. No wheezing or rales.  Musculoskeletal:     Right lower leg: Edema (1+ pitting) present.     Left lower leg: Edema (1+ pitting) present.  Skin:    General: Skin is warm and dry.     Findings: No rash.  Neurological:     Mental Status: He is alert. Mental status is at baseline.  Psychiatric:        Mood and Affect: Mood normal.        Lab Results  Component Value Date   WBC 6.7 08/04/2022   HGB 8.8 (L) 08/13/2022   HCT 26.0 (L) 08/13/2022   PLT 210 08/04/2022   GLUCOSE 89 11/04/2022   CHOL 221 (H) 11/04/2022   TRIG 134 11/04/2022   HDL 65 11/04/2022   LDLCALC 132  (H) 11/04/2022   ALT 17 11/04/2022   AST 27 11/04/2022   NA 144 11/04/2022   K 5.0 11/04/2022   CL 109 (H) 11/04/2022   CREATININE 1.10 11/04/2022   BUN 13 11/04/2022   CO2 24 11/04/2022   TSH 0.449 05/16/2022   INR 1.2 02/26/2020   HGBA1C 4.7 (L) 02/14/2020     Assessment & Plan:    See Problem List for Assessment and Plan of chronic medical problems.

## 2022-11-19 NOTE — Patient Instructions (Addendum)
     Flu immunization administered today.     Medications changes include :   with your next refill we will decrease the oxycodone to 3 times a day      Return in about 4 months (around 03/23/2023) for follow up.

## 2022-11-20 ENCOUNTER — Ambulatory Visit: Payer: Medicare Other | Admitting: Internal Medicine

## 2022-11-20 VITALS — BP 108/74 | HR 52 | Temp 97.9°F | Ht 71.0 in | Wt 195.0 lb

## 2022-11-20 DIAGNOSIS — M810 Age-related osteoporosis without current pathological fracture: Secondary | ICD-10-CM | POA: Diagnosis not present

## 2022-11-20 DIAGNOSIS — F32A Depression, unspecified: Secondary | ICD-10-CM

## 2022-11-20 DIAGNOSIS — Z23 Encounter for immunization: Secondary | ICD-10-CM | POA: Diagnosis not present

## 2022-11-20 DIAGNOSIS — R350 Frequency of micturition: Secondary | ICD-10-CM | POA: Diagnosis not present

## 2022-11-20 DIAGNOSIS — G894 Chronic pain syndrome: Secondary | ICD-10-CM

## 2022-11-20 DIAGNOSIS — E273 Drug-induced adrenocortical insufficiency: Secondary | ICD-10-CM

## 2022-11-20 DIAGNOSIS — N401 Enlarged prostate with lower urinary tract symptoms: Secondary | ICD-10-CM

## 2022-11-20 DIAGNOSIS — F419 Anxiety disorder, unspecified: Secondary | ICD-10-CM

## 2022-11-20 MED ORDER — CLONAZEPAM 1 MG PO TABS: 1.0000 mg | ORAL_TABLET | Freq: Two times a day (BID) | ORAL | 1 refills | Status: DC

## 2022-11-20 MED ORDER — OXYCODONE HCL 5 MG PO TABS: 5.0000 mg | ORAL_TABLET | Freq: Three times a day (TID) | ORAL | Status: DC | PRN

## 2022-11-20 MED ORDER — DULOXETINE HCL 30 MG PO CPEP: 30.0000 mg | ORAL_CAPSULE | Freq: Every day | ORAL | 1 refills | Status: DC

## 2022-11-20 NOTE — Assessment & Plan Note (Signed)
On prolia Management per endocrine

## 2022-11-20 NOTE — Assessment & Plan Note (Signed)
Chronic On chronic narcotics-oxycodone-we are slowly tapering him off medication Currently taking oxycodone 5 mg pills 4 times a day for 20 mg/day Stressed regular exercise, distraction from the pain Last refill was 11/10/2022 With next refill will decrease decrease to oxycodone to 15 mg per day -5 mg 4 times a day Also on Cymbalta 30 mg daily-continue Follow-up in 4 months

## 2022-11-20 NOTE — Assessment & Plan Note (Signed)
Chronic Management per endocrine 

## 2022-11-20 NOTE — Assessment & Plan Note (Signed)
Chronic Anxious and depressed at times Continue clonazepam 1 mg twice daily Continue Cymbalta 30 mg daily-could consider increasing

## 2022-11-20 NOTE — Assessment & Plan Note (Signed)
Chronic Symptoms controlled-feels like he is emptying his bladder Continue Flomax 0.4 mg daily, proscar 5 mg daily

## 2022-12-01 DIAGNOSIS — H26493 Other secondary cataract, bilateral: Secondary | ICD-10-CM | POA: Diagnosis not present

## 2022-12-01 NOTE — Telephone Encounter (Signed)
Prolia VOB initiated via [email or web address]  Next Prolia inj DUE: 12/28/22

## 2022-12-07 ENCOUNTER — Encounter: Payer: Self-pay | Admitting: Internal Medicine

## 2022-12-09 MED ORDER — OXYCODONE HCL 5 MG PO TABS: 5.0000 mg | ORAL_TABLET | Freq: Three times a day (TID) | ORAL | 0 refills | Status: DC | PRN

## 2022-12-10 NOTE — Telephone Encounter (Signed)
Patient called and said that he meant to say he would be out on the 8th.  He needs it called in today if at all possible.  Thank you

## 2022-12-11 DIAGNOSIS — C439 Malignant melanoma of skin, unspecified: Secondary | ICD-10-CM | POA: Diagnosis not present

## 2022-12-11 DIAGNOSIS — C7931 Secondary malignant neoplasm of brain: Secondary | ICD-10-CM | POA: Diagnosis not present

## 2022-12-11 DIAGNOSIS — Z85828 Personal history of other malignant neoplasm of skin: Secondary | ICD-10-CM | POA: Diagnosis not present

## 2022-12-11 DIAGNOSIS — Z9221 Personal history of antineoplastic chemotherapy: Secondary | ICD-10-CM | POA: Diagnosis not present

## 2022-12-11 DIAGNOSIS — Z08 Encounter for follow-up examination after completed treatment for malignant neoplasm: Secondary | ICD-10-CM | POA: Diagnosis not present

## 2022-12-11 DIAGNOSIS — Z85841 Personal history of malignant neoplasm of brain: Secondary | ICD-10-CM | POA: Diagnosis not present

## 2022-12-14 DIAGNOSIS — C7931 Secondary malignant neoplasm of brain: Secondary | ICD-10-CM | POA: Diagnosis not present

## 2022-12-26 NOTE — Telephone Encounter (Signed)
No Prior Authorization required for PROLIA  Primary: Medicare COVERAGE DETAILS: Prolia and administration will be subject to a $240.00 deductible ($240.00 met) and  20.0% coinsurance. The benefits provided on this Verification of Benefits form are Medical Benefits and are  the patient's In-Network benefits for Prolia. If you would like Pharmacy Benefits for Prolia, please call [phone].    Secondary: AARP Medicare Supplement Plan F  COVERAGE DETAILS: For the Secondary MD Purchase access option, this is a Medicare Supplement  Plan F and it covers the Medicare Part B deductible, co-insurance and 100% of the excess charges. The  benefits provided on this Verification of Benefits form are Medical Benefits and are the patient's In-Network  benefits for Prolia. If you would like Pharmacy Benefits for Prolia, please call [phone]

## 2022-12-29 ENCOUNTER — Ambulatory Visit: Payer: Medicare Other

## 2022-12-29 ENCOUNTER — Encounter: Payer: Self-pay | Admitting: Internal Medicine

## 2022-12-29 ENCOUNTER — Ambulatory Visit (INDEPENDENT_AMBULATORY_CARE_PROVIDER_SITE_OTHER): Payer: Medicare Other | Admitting: Internal Medicine

## 2022-12-29 VITALS — BP 122/70 | HR 80 | Ht 71.0 in | Wt 188.6 lb

## 2022-12-29 DIAGNOSIS — M810 Age-related osteoporosis without current pathological fracture: Secondary | ICD-10-CM | POA: Diagnosis not present

## 2022-12-29 DIAGNOSIS — E038 Other specified hypothyroidism: Secondary | ICD-10-CM

## 2022-12-29 DIAGNOSIS — E273 Drug-induced adrenocortical insufficiency: Secondary | ICD-10-CM | POA: Diagnosis not present

## 2022-12-29 MED ORDER — DENOSUMAB 60 MG/ML ~~LOC~~ SOSY
60.0000 mg | PREFILLED_SYRINGE | Freq: Once | SUBCUTANEOUS | Status: AC
Start: 2023-06-28 — End: 2023-06-30
  Administered 2023-06-30: 60 mg via SUBCUTANEOUS

## 2022-12-29 MED ORDER — DENOSUMAB 60 MG/ML ~~LOC~~ SOSY
60.0000 mg | PREFILLED_SYRINGE | Freq: Once | SUBCUTANEOUS | Status: AC
Start: 2022-12-29 — End: 2022-12-29
  Administered 2022-12-29: 60 mg via SUBCUTANEOUS

## 2022-12-29 NOTE — Progress Notes (Addendum)
Patient ID: Maxwell Fox, male   DOB: 11-14-1945, 77 y.o.   MRN: [id number]   HPI  Maxwell Fox is a 77 y.o.-year-old male, initially referred by his PCP, Dr. Lawerance Bach, returning for follow-up for central adrenal insufficiency,  central hypothyroidism, and clinical osteoporosis.  Last visit 6 months ago. Prev. Saw Dr. Julien Girt at [facility].  Interim history: Patient has a history of metastatic melanoma. He had a lot of weakness and disequilibrium after his immune therapy was increased before last visit.  He is now doing better.  At last visit he was doing outpatient PT >> continues at home. Now Pilates 2x a week with his daughter. He is working on reducing opiates. He has increased fatigue. No nausea, AP, weight loss. No falls or fractures since last visit.  He had a stent placed 08/13/2022 (95% blockage). On Plavix.  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 PPIs (Omeprazole)  Protonix at night - + MVI and calcium at 4 PM  - not on Biotin  Reviewed patient's TFTs: Lab Results  Component Value Date   TSH 0.449 05/16/2022   TSH 0.234 (L) 03/04/2020   TSH 0.53 08/18/2019   TSH 0.53 02/17/2019   TSH 0.550 08/16/2018   TSH 2.39 11/01/2014   FREET4 1.01 08/21/2020   FREET4 0.96 08/18/2019   FREET4 1.36 02/17/2019   T3FREE 3.8 08/21/2020   T3FREE 2.3 02/17/2019  10/28/2021: TSH 2.09 Reviewed records from [facility] ([facility] Everywhere):   Antithyroid antibodies: No results found for: "THGAB" No components found for: "TPOAB"  Pt denies: - feeling nodules in neck - hoarseness - dysphagia - choking  She has no FH of thyroid disorders. No FH of thyroid cancer. No h/o radiation tx to head or neck. No herbal supplements. No Biotin use.   Central adrenal  insufficiency:  His adrenal insufficiency was diagnosed in 2018, secondary to metastatic melanoma treatment.    21-hydroxylase antibodies were not elevated:   He is on prednisone 5 mg daily in the morning. He tried 3 and 6 mg >> he feels best on 5 mg.  She  Osteoporosis: - In 01/2020, he had a closed fracture of the superior is ramus of left pubis after falling in his bathroom.  He is now pain-free. In 2022, he also had GIB 2/2 staples still left at the site of his previous gastric bypass surgery. He had a colonoscopy and 4 EGDs. He was weak and fell again several times due to persistent anemia.  He actually had a pelvic refracture while in house when they were turning him.  We checked a bone density scan in 08/2020 and this showed osteoporosis: 08/22/2020 ([place]) Lumbar spine L1-L4 (L2) Femoral neck (FN) 33% distal radius  T-score -0.8 RFN: -2.6 LFN: -2.2 n/a   We started Prolia 12/12/2021.  Subsequent dose: 06/26/2022.  Reviewed Vitamin D levels: Lab Results  Component Value Date   VD25OH 45.23 11/04/2021   VD25OH 66.31 08/21/2020   VD25OH 44.63 01/07/2017   VD25OH 47.11 11/01/2014  He is on 1000 units vitamin D supplement daily - missing some doses.  BMP levels reviewed: 12/11/2022: BUN/creatinine, 21/1.2, GFR 62, calcium 9.1, normal  Of note, he has a history of R en Y gastric bypass in 2000 (initial weight : 350 lbs!),  HTN. He also had a TIA in 2018, but this appears to have resolved.   He has nocturia 1-2x a night -he takes Flomax.  He moved from [place] to [place] to be near her daughter and her 3 children.  Daughter is going through a difficult divorce.  ROS:+ see HPI + vitiligo hands and arms after immune treatment for his melanoma, also + easy bruising  I reviewed pt's medications, allergies, PMH, social hx, family hx, and changes were documented in the history of present illness. Otherwise, unchanged from my initial visit note.  Past Medical History:   Diagnosis Date   B12 nutritional deficiency    malabsorbtion   Blood transfusion without reported diagnosis 2022   BPH (benign prostatic hypertrophy)    Brain cancer (HCC) 2017   removed "golf ball sized mass"   Depression    on meds   Diverticulosis    GERD (gastroesophageal reflux disease)    on meds   GI bleeding 02/2020   Hypertension    Iron deficiency anemia    malabsorption related (s/p gastric bypass)   Melanoma (HCC)    Obesity    s/p gastric bypass 2000, start 355#   Pancreatic cyst    Pulmonary nodules    Thyroid disease    on meds   Past Surgical History:  Procedure Laterality Date   CHOLECYSTECTOMY  2000   COLONOSCOPY WITH PROPOFOL N/A 03/05/2020   Procedure: COLONOSCOPY WITH PROPOFOL;  Surgeon: Lemar Lofty., MD;  Location: [facility] ENDOSCOPY;  Service: Gastroenterology;  Laterality: N/A;   CORONARY STENT INTERVENTION N/A 08/13/2022   Procedure: CORONARY STENT INTERVENTION;  Surgeon: [name], MD;  Location: [facility] INVASIVE CV LAB;  Service: Cardiovascular;  Laterality: N/A;   CRANIOTOMY FOR TUMOR  12/11/2015   metastatic melanoma   ENTEROSCOPY N/A 03/30/2020   Procedure: ENTEROSCOPY;  Surgeon: Hilarie Fredrickson, MD;  Location: [facility] ENDOSCOPY;  Service: Endoscopy;  Laterality: N/A;   ESOPHAGOGASTRODUODENOSCOPY N/A 03/30/2020   Procedure: ESOPHAGOGASTRODUODENOSCOPY (EGD);  Surgeon: Hilarie Fredrickson, MD;  Location: [facility] ENDOSCOPY;  Service: Endoscopy;  Laterality: N/A;   ESOPHAGOGASTRODUODENOSCOPY (EGD) WITH PROPOFOL N/A 02/14/2020   Procedure: ESOPHAGOGASTRODUODENOSCOPY (EGD) WITH PROPOFOL;  Surgeon: Shellia Cleverly, DO;  Location: [facility] ENDOSCOPY;  Service: Endoscopy;  Laterality: N/A;   ESOPHAGOGASTRODUODENOSCOPY (EGD) WITH PROPOFOL N/A 02/26/2020   Procedure: ESOPHAGOGASTRODUODENOSCOPY (EGD) WITH PROPOFOL;  Surgeon: Hilarie Fredrickson, MD;  Location[facility] ENDOSCOPY;  Service: Endoscopy;  Laterality: N/A;   ESOPHAGOGASTRODUODENOSCOPY (EGD) WITH PROPOFOL N/A 03/03/2020    Procedure: ESOPHAGOGASTRODUODENOSCOPY (EGD) WITH PROPOFOL;  Surgeon: Jeani Hawking, MD;  Location: [facility] ENDOSCOPY;  Service: Endoscopy;  Laterality: N/A;   ESOPHAGOGASTRODUODENOSCOPY (EGD) WITH PROPOFOL N/A 03/28/2020   Procedure: ESOPHAGOGASTRODUODENOSCOPY (EGD) WITH PROPOFOL;  Surgeon: Hilarie Fredrickson, MD;  Location: [facility] ENDOSCOPY;  Service: Endoscopy;  Laterality: N/A;   FOREIGN BODY REMOVAL  03/30/2020   Procedure: FOREIGN BODY REMOVAL;  Surgeon: Hilarie Fredrickson, MD;  Location: [facility] ENDOSCOPY;  Service: Endoscopy;;   GIVENS CAPSULE STUDY N/A 03/28/2020   Procedure: GIVENS CAPSULE STUDY;  Surgeon: Hilarie Fredrickson, MD;  Location: [facility] ENDOSCOPY;  Service: Endoscopy;  Laterality: N/A;   HEMOSTASIS CONTROL  02/26/2020   Procedure: HEMOSTASIS CONTROL;  Surgeon: Hilarie Fredrickson, MD;  Location: [facility] ENDOSCOPY;  Service: Endoscopy;;   HEMOSTASIS CONTROL  03/30/2020   Procedure: HEMOSTASIS CONTROL;  Surgeon: Hilarie Fredrickson, MD;  Location: [facility] ENDOSCOPY;  Service: Endoscopy;;   HOT HEMOSTASIS N/A 02/26/2020   Procedure: HOT HEMOSTASIS (ARGON PLASMA COAGULATION/BICAP);  Surgeon: Hilarie Fredrickson, MD;  Location: [facility] ENDOSCOPY;  Service: Endoscopy;  Laterality: N/A;   MOHS SURGERY  2012   melanoma, mid back, [place]   RIGHT/LEFT HEART CATH AND CORONARY ANGIOGRAPHY N/A 08/13/2022   Procedure: RIGHT/LEFT HEART CATH AND CORONARY ANGIOGRAPHY;  Surgeon: [name], MD;  Location: [facility] INVASIVE CV LAB;  Service: Cardiovascular;  Laterality: N/A;   ROUX-EN-Y PROCEDURE  2000   TOTAL SHOULDER REPLACEMENT Right 2011   [place]   WISDOM TOOTH EXTRACTION     Social History   Socioeconomic History   Marital status: Married    Spouse name: Not on file   Number of children: 2   Years of education: Not on file   Highest education level: Not on file  Occupational History   retired  Tobacco Use   Smoking status: Former Smoker    Quit date: 02/03/1963    Years since quitting: 56.0   Smokeless tobacco: Never Used  Substance  and Sexual Activity   Alcohol use: Not on file    Comment: wine 2x/week   Drug use: Not on file   Sexual activity: Not on file  Other Topics Concern   Not on file  Social History Narrative   Lives with wife   Retired - Geologist, 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 [employer]   Social Determinants of Corporate investment banker Strain:    Difficulty of Paying Living Expenses: Not on [employer] Insecurity:    Worried About Programme researcher, broadcasting/film/video in the Last Year: Not on file   [employer] of Food in the Last Year: Not on file  Transportation Needs:    Freight forwarder (Medical): Not on file   Lack of Transportation (Non-Medical): Not on file  Physical Activity:    Days of Exercise per Week: Not on file   Minutes of Exercise per Session: Not on file  Stress:    Feeling of Stress : Not on file  Social Connections:    Frequency of Communication with Friends and Family: Not on file   Frequency of Social Gatherings with Friends and Family: Not on file   Attends Religious Services: Not on file   Active Member of Clubs or Organizations: Not on file   Attends Banker Meetings: Not on file   Marital Status: Not on file  Intimate Partner Violence:    Fear of Current or Ex-Partner: Not on file   Emotionally Abused: Not on file   Physically Abused: Not on file   Sexually Abused: Not on file   Current Outpatient Medications on File Prior to Visit  Medication Sig Dispense Refill   acetaminophen (TYLENOL) 325 MG tablet Take 2 tablets (650 mg total) by mouth every 6 (six) hours as needed for moderate pain.     aspirin 81 MG chewable tablet Chew 1 tablet (81 mg total) by mouth daily. 90 tablet 3   calcium carbonate (OS-CAL - DOSED IN MG OF ELEMENTAL CALCIUM) 1250 (500 Ca) MG tablet Take 1 tablet by mouth daily with supper.     Cholecalciferol (VITAMIN D) 2000 UNITS tablet Take 1 tablet (2,000 Units total) by mouth daily. 30 tablet 11   clonazePAM (KLONOPIN) 1 MG tablet Take 1  tablet (1 mg total) by mouth 2 (two) times daily. 180 tablet 1   clopidogrel (PLAVIX) 75 MG tablet Take 1 tablet (75 mg total) by mouth daily with breakfast. 90 tablet 3   DULoxetine (CYMBALTA) 30 MG  capsule Take 1 capsule (30 mg total) by mouth daily. 90 capsule 1   finasteride (PROSCAR) 5 MG tablet TAKE 1 TABLET(5 MG) BY MOUTH DAILY 90 tablet 1   levothyroxine (SYNTHROID) 88 MCG tablet TAKE 1 TABLET BY MOUTH EVERY DAY BEFORE BREAKFAST 45 tablet 1   Multiple Vitamin (MULTIVITAMIN WITH MINERALS) TABS tablet Take 1 tablet by mouth in the morning.     ondansetron (ZOFRAN) 4 MG tablet TAKE 1 TABLET(4 MG) BY MOUTH EVERY 8 HOURS AS NEEDED FOR NAUSEA OR VOMITING 90 tablet 1   oxyCODONE (ROXICODONE) 5 MG immediate release tablet Take 1 tablet (5 mg total) by mouth every 8 (eight) hours as needed for severe pain (pain score 7-10) (chronic back pain). MDD 3 90 tablet 0   pantoprazole (PROTONIX) 40 MG tablet Take 1 tablet (40 mg total) by mouth daily. Please follow up with primary care provider for additional refills. 90 tablet 3   predniSONE (DELTASONE) 5 MG tablet TAKE 1 TABLET BY MOUTH EVERY DAY WITH BREAKFAST 90 tablet 3   simvastatin (ZOCOR) 20 MG tablet Take 1 tablet (20 mg total) by mouth at bedtime. 90 tablet 3   sucralfate (CARAFATE) 1 GM/10ML suspension Take 1 g by mouth in the morning.     tamsulosin (FLOMAX) 0.4 MG CAPS capsule TAKE 1 CAPSULE BY MOUTH EVERY DAY AFTER SUPPER (Patient taking differently: Take 0.4 mg by mouth in the morning.) 90 capsule 1   No current facility-administered medications on file prior to visit.   Allergies  Allergen Reactions   Rosuvastatin Other (See Comments)    Leg pains    Family History  Problem Relation Age of Onset   Hypertension Father    Macular degeneration Mother    Melanoma Mother    Melanoma Sister    Stomach cancer Sister 69       duodenal   Breast cancer Sister    Uterine cancer Sister    Melanoma Brother    Colon polyps Neg Hx    Colon  cancer Neg Hx    Esophageal cancer Neg Hx    Rectal cancer Neg Hx    PE: BP 122/70   Pulse 80   Ht 5\' 11"  (1.803 m)   Wt 188 lb 9.6 oz (85.5 kg)   SpO2 99%   BMI 26.30 kg/m  Wt Readings from Last 3 Encounters:  12/29/22 188 lb 9.6 oz (85.5 kg)  11/20/22 195 lb (88.5 kg)  11/06/22 191 lb 3.2 oz (86.7 kg)   Constitutional: normal weight in NAD, frail Eyes: EOMI, no exophthalmos ENT: no thyromegaly, no cervical lymphadenopathy Cardiovascular: RRR, No MRG, wears compression hoses Respiratory: CTA B Musculoskeletal: no deformities Skin:  no rashes Neurological: no tremor with outstretched hands  ASSESSMENT: 1. Central Hypothyroidism  2. Central adrenal insufficiency  3.  Osteoporosis - h/o Ramus pubis fracture  PLAN:  1. Patient with at least a component of central hypothyroidism, on levothyroxine treatment -Possibly related to ipilimumab, but also pembrolizumab, although the latter most likely causes thyroiditis more frequently than hypophysitis - His TSH levels are actually normal, so he appears to have at least some central TSH production, with the latest level being last month.  We discussed the free T4 levels are usually more accurate in following send 0 hypothyroidism patients, however, in his case, we will follow both TSH and free T4. - latest thyroid labs reviewed with pt. >> normal: Lab Results  Component Value Date   TSH 0.449 05/16/2022  - he continues  on LT4 88 mcg daily - pt feels good on this dose. - we discussed about taking the thyroid hormone every day, with water, >30 minutes before breakfast, separated by >4 hours from acid reflux medications, calcium, iron, multivitamins. Pt. is taking it correctly. -At today's visit we will check a TSH and free T4.  He did take prednisone or levothyroxine last night - 8h ago. Advised to move these to am. - I will see him back in 1 year  2.  Central adrenal insufficiency -He continues on 5 mg of prednisone daily, which  he feels is the right dose for him.  He tried 3 mg daily but this did not work well for him. -Cortisol and ACTH are not needed for follow-up -No adrenal insufficiency symptoms: He denies increased weakness, dizziness, weight loss, abdominal pain, headaches, anorexia -We again reviewed sick day rules: Increasing the dose of prednisone x 2 in case of fever or increased stress like dehydration, diarrhea, but to get the medication parenterally if he cannot keep the tablets down.  He did not have to apply sick day rules since last visit. -He does have a steroid solution at home to inject in case of an emergency  3.  Osteoporosis -He has a history of ramus pubis fracture after he fell from level ground -His bone density showed osteoporosis in 08/2020 -Oncology did not recommend Xgeva or Zometa so we started Prolia in 12/12/2021.  He is tolerating this well, without jaw/thigh/hip pain. -Last injection was at last visit. -His calcium level was slightly low, at 8.5 (8.7-10.2) on 04/16/2021, however, an albumin level was also low at that time so the corrected calcium was actually normal, at 9.2.  Alkaline phosphatase was slightly high, at 126 (24-110).  He had a more recent calcium level which was normal at 9.1 on 12/11/2022. -His latest vitamin D level was normal 1 year ago, at 45.  He continues on 1000 units vitamin D daily.  Will recheck the level today. -He is on prednisone 5 mg daily. -We discussed about avoiding falls.  No falls or fractures since last visit. -We will recheck his bone density now, approximately 1 year after starting Prolia. -Will see him back in a year  Needs 90 day refills - LT4.  Orders Placed This Encounter  Procedures   DG Bone Density   Vitamin D, 25-hydroxy   TSH   T4, free   Component     Latest Ref Rng 12/29/2022  TSH     0.40 - 4.50 mIU/L 0.83   T4,Free(Direct)     0.8 - 1.8 ng/dL 1.3   Vitamin D, 96-[id number]     30 - 100 ng/mL 54   Normal labs.   01/04/2023  ([facility]) Lumbar spine L1-L4 (L2) Femoral neck (FN) 33% distal radius Ultra distal radius  T-score   -0.4 RFN: -3.2 LFN: -2.9 + 0.2 -1.1  Change in BMD from previous DXA test (%) Up 4.0%* Down 11.9%* n/a N/a  (*) statistically significant  There is a significant decrease in bone density in bilateral hips.  I reviewed the images of both 2022 and 2024 studies and I do not see any differences in the region of interest delimitation.  We started Prolia at the end of last year, approximately 1.5 years after the last bone density.  It is possible that he lost bone density since the previous DXA scan until starting Prolia.  My suggestion for now is to continue on Prolia and I would like to recheck  another bone density next year, rather than waiting 2 years.  Carlus Pavlov, MD PhD [facility] Endocrinology

## 2022-12-29 NOTE — Progress Notes (Signed)
After obtaining consent, and per orders of Dr. Elvera Lennox, injection of Prolia given by Pollie Meyer. Patient instructed to remain in clinic for 20 minutes afterwards, and to report any adverse reaction to me immediately.

## 2022-12-29 NOTE — Patient Instructions (Addendum)
Please continue Levothyroxine 88 mcg daily.  Move the levothyroxine to morning at waking up.  Take the thyroid hormone every day, with water, at least 30 minutes before breakfast, separated by at least 4 hours from: - acid reflux medications - calcium - iron - multivitamins  Continue prednisone 5 mg daily.  Please remember: - You absolutely need to take this medication every day and not skip doses. - Please double the dose if you have a fever, for the duration of the fever. - If you cannot take anything by mouth (vomiting) or you have severe diarrhea so that you eliminate the Prednisone pills in your stool, please make sure that you get the Prednisone in the vein instead - go to the nearest emergency department/urgent care or you may go to your PCPs office.  Please call and schedule bone density scan at the [facility] Office: [phone].   Please come back for a follow-up appointment in 1 year.

## 2022-12-30 LAB — T4, FREE: Free T4: 1.3 ng/dL (ref 0.8–1.8)

## 2022-12-30 LAB — VITAMIN D 25 HYDROXY (VIT D DEFICIENCY, FRACTURES): Vit D, 25-Hydroxy: 54 ng/mL (ref 30–100)

## 2022-12-30 LAB — TSH: TSH: 0.83 m[IU]/L (ref 0.40–4.50)

## 2022-12-30 MED ORDER — LEVOTHYROXINE SODIUM 88 MCG PO TABS: ORAL_TABLET | ORAL | 3 refills | Status: DC

## 2022-12-30 NOTE — Addendum Note (Signed)
Addended by: Carlus Pavlov on: 12/30/2022 08:21 AM   Modules accepted: Orders

## 2023-01-01 NOTE — Telephone Encounter (Signed)
Last Prolia inj 12/29/22 Next Prolia inj due 06/29/23

## 2023-01-04 ENCOUNTER — Ambulatory Visit (INDEPENDENT_AMBULATORY_CARE_PROVIDER_SITE_OTHER)
Admission: RE | Admit: 2023-01-04 | Discharge: 2023-01-04 | Disposition: A | Discharge status: Home / Self Care | Payer: Medicare Other | Source: Ambulatory Visit | Attending: Internal Medicine | Admitting: Internal Medicine

## 2023-01-04 DIAGNOSIS — M810 Age-related osteoporosis without current pathological fracture: Secondary | ICD-10-CM | POA: Diagnosis not present

## 2023-01-05 ENCOUNTER — Encounter: Payer: Self-pay | Admitting: Internal Medicine

## 2023-01-05 DIAGNOSIS — L821 Other seborrheic keratosis: Secondary | ICD-10-CM | POA: Diagnosis not present

## 2023-01-05 DIAGNOSIS — Z85820 Personal history of malignant melanoma of skin: Secondary | ICD-10-CM | POA: Diagnosis not present

## 2023-01-05 DIAGNOSIS — L814 Other melanin hyperpigmentation: Secondary | ICD-10-CM | POA: Diagnosis not present

## 2023-01-05 DIAGNOSIS — Z08 Encounter for follow-up examination after completed treatment for malignant neoplasm: Secondary | ICD-10-CM | POA: Diagnosis not present

## 2023-01-05 DIAGNOSIS — D225 Melanocytic nevi of trunk: Secondary | ICD-10-CM | POA: Diagnosis not present

## 2023-01-05 MED ORDER — OXYCODONE HCL 5 MG PO TABS: 5.0000 mg | ORAL_TABLET | Freq: Three times a day (TID) | ORAL | 0 refills | Status: DC | PRN

## 2023-01-15 DIAGNOSIS — C7931 Secondary malignant neoplasm of brain: Secondary | ICD-10-CM | POA: Diagnosis not present

## 2023-01-15 DIAGNOSIS — C439 Malignant melanoma of skin, unspecified: Secondary | ICD-10-CM | POA: Diagnosis not present

## 2023-01-17 ENCOUNTER — Encounter: Payer: Self-pay | Admitting: Cardiovascular Disease

## 2023-01-19 ENCOUNTER — Other Ambulatory Visit: Payer: Self-pay

## 2023-01-19 ENCOUNTER — Inpatient Hospital Stay (HOSPITAL_COMMUNITY)
Admission: EM | Admit: 2023-01-19 | Discharge: 2023-01-21 | DRG: 871 | Disposition: A | Discharge status: Home / Self Care | Payer: Medicare Other | Attending: Internal Medicine | Admitting: Internal Medicine

## 2023-01-19 ENCOUNTER — Emergency Department (HOSPITAL_COMMUNITY): Payer: Medicare Other

## 2023-01-19 ENCOUNTER — Encounter (HOSPITAL_COMMUNITY): Payer: Self-pay

## 2023-01-19 ENCOUNTER — Telehealth: Payer: Self-pay | Admitting: Cardiovascular Disease

## 2023-01-19 DIAGNOSIS — Z85820 Personal history of malignant melanoma of skin: Secondary | ICD-10-CM

## 2023-01-19 DIAGNOSIS — Z8059 Family history of malignant neoplasm of other urinary tract organ: Secondary | ICD-10-CM

## 2023-01-19 DIAGNOSIS — D509 Iron deficiency anemia, unspecified: Secondary | ICD-10-CM | POA: Diagnosis present

## 2023-01-19 DIAGNOSIS — C7931 Secondary malignant neoplasm of brain: Secondary | ICD-10-CM | POA: Diagnosis present

## 2023-01-19 DIAGNOSIS — E876 Hypokalemia: Secondary | ICD-10-CM | POA: Diagnosis present

## 2023-01-19 DIAGNOSIS — J189 Pneumonia, unspecified organism: Secondary | ICD-10-CM | POA: Diagnosis not present

## 2023-01-19 DIAGNOSIS — Z85841 Personal history of malignant neoplasm of brain: Secondary | ICD-10-CM

## 2023-01-19 DIAGNOSIS — Z9049 Acquired absence of other specified parts of digestive tract: Secondary | ICD-10-CM

## 2023-01-19 DIAGNOSIS — G9341 Metabolic encephalopathy: Secondary | ICD-10-CM | POA: Diagnosis present

## 2023-01-19 DIAGNOSIS — E669 Obesity, unspecified: Secondary | ICD-10-CM | POA: Diagnosis present

## 2023-01-19 DIAGNOSIS — Z8269 Family history of other diseases of the musculoskeletal system and connective tissue: Secondary | ICD-10-CM

## 2023-01-19 DIAGNOSIS — R7989 Other specified abnormal findings of blood chemistry: Secondary | ICD-10-CM

## 2023-01-19 DIAGNOSIS — Z9884 Bariatric surgery status: Secondary | ICD-10-CM | POA: Diagnosis not present

## 2023-01-19 DIAGNOSIS — E039 Hypothyroidism, unspecified: Secondary | ICD-10-CM

## 2023-01-19 DIAGNOSIS — Z7902 Long term (current) use of antithrombotics/antiplatelets: Secondary | ICD-10-CM

## 2023-01-19 DIAGNOSIS — Z87891 Personal history of nicotine dependence: Secondary | ICD-10-CM

## 2023-01-19 DIAGNOSIS — J9811 Atelectasis: Secondary | ICD-10-CM | POA: Diagnosis present

## 2023-01-19 DIAGNOSIS — F32A Depression, unspecified: Secondary | ICD-10-CM | POA: Diagnosis present

## 2023-01-19 DIAGNOSIS — E538 Deficiency of other specified B group vitamins: Secondary | ICD-10-CM | POA: Diagnosis present

## 2023-01-19 DIAGNOSIS — C439 Malignant melanoma of skin, unspecified: Secondary | ICD-10-CM

## 2023-01-19 DIAGNOSIS — Z79899 Other long term (current) drug therapy: Secondary | ICD-10-CM

## 2023-01-19 DIAGNOSIS — A403 Sepsis due to Streptococcus pneumoniae: Principal | ICD-10-CM | POA: Diagnosis present

## 2023-01-19 DIAGNOSIS — K909 Intestinal malabsorption, unspecified: Secondary | ICD-10-CM | POA: Diagnosis present

## 2023-01-19 DIAGNOSIS — I5022 Chronic systolic (congestive) heart failure: Secondary | ICD-10-CM | POA: Diagnosis present

## 2023-01-19 DIAGNOSIS — N289 Disorder of kidney and ureter, unspecified: Secondary | ICD-10-CM | POA: Diagnosis not present

## 2023-01-19 DIAGNOSIS — N179 Acute kidney failure, unspecified: Secondary | ICD-10-CM | POA: Diagnosis not present

## 2023-01-19 DIAGNOSIS — Z800 Family history of malignant neoplasm of digestive organs: Secondary | ICD-10-CM

## 2023-01-19 DIAGNOSIS — A419 Sepsis, unspecified organism: Secondary | ICD-10-CM

## 2023-01-19 DIAGNOSIS — I110 Hypertensive heart disease with heart failure: Secondary | ICD-10-CM | POA: Diagnosis not present

## 2023-01-19 DIAGNOSIS — E038 Other specified hypothyroidism: Secondary | ICD-10-CM | POA: Diagnosis present

## 2023-01-19 DIAGNOSIS — Z808 Family history of malignant neoplasm of other organs or systems: Secondary | ICD-10-CM

## 2023-01-19 DIAGNOSIS — R6520 Severe sepsis without septic shock: Secondary | ICD-10-CM | POA: Diagnosis present

## 2023-01-19 DIAGNOSIS — Z7952 Long term (current) use of systemic steroids: Secondary | ICD-10-CM | POA: Diagnosis not present

## 2023-01-19 DIAGNOSIS — Z888 Allergy status to other drugs, medicaments and biological substances status: Secondary | ICD-10-CM

## 2023-01-19 DIAGNOSIS — R4182 Altered mental status, unspecified: Secondary | ICD-10-CM | POA: Diagnosis not present

## 2023-01-19 DIAGNOSIS — N400 Enlarged prostate without lower urinary tract symptoms: Secondary | ICD-10-CM | POA: Diagnosis present

## 2023-01-19 DIAGNOSIS — I5020 Unspecified systolic (congestive) heart failure: Secondary | ICD-10-CM | POA: Diagnosis not present

## 2023-01-19 DIAGNOSIS — R001 Bradycardia, unspecified: Secondary | ICD-10-CM | POA: Diagnosis not present

## 2023-01-19 DIAGNOSIS — Z79890 Hormone replacement therapy (postmenopausal): Secondary | ICD-10-CM | POA: Diagnosis not present

## 2023-01-19 DIAGNOSIS — Z96611 Presence of right artificial shoulder joint: Secondary | ICD-10-CM | POA: Diagnosis present

## 2023-01-19 DIAGNOSIS — Z803 Family history of malignant neoplasm of breast: Secondary | ICD-10-CM

## 2023-01-19 DIAGNOSIS — I959 Hypotension, unspecified: Secondary | ICD-10-CM | POA: Diagnosis not present

## 2023-01-19 DIAGNOSIS — R531 Weakness: Secondary | ICD-10-CM

## 2023-01-19 DIAGNOSIS — K219 Gastro-esophageal reflux disease without esophagitis: Secondary | ICD-10-CM | POA: Diagnosis present

## 2023-01-19 DIAGNOSIS — E2740 Unspecified adrenocortical insufficiency: Secondary | ICD-10-CM | POA: Diagnosis not present

## 2023-01-19 DIAGNOSIS — Z8249 Family history of ischemic heart disease and other diseases of the circulatory system: Secondary | ICD-10-CM

## 2023-01-19 DIAGNOSIS — Z955 Presence of coronary angioplasty implant and graft: Secondary | ICD-10-CM

## 2023-01-19 DIAGNOSIS — Z7982 Long term (current) use of aspirin: Secondary | ICD-10-CM

## 2023-01-19 DIAGNOSIS — R42 Dizziness and giddiness: Secondary | ICD-10-CM | POA: Diagnosis not present

## 2023-01-19 DIAGNOSIS — R918 Other nonspecific abnormal finding of lung field: Secondary | ICD-10-CM | POA: Diagnosis not present

## 2023-01-19 LAB — CBC WITH DIFFERENTIAL/PLATELET
Abs Immature Granulocytes: 0.06 10*3/uL (ref 0.00–0.07)
Basophils Absolute: 0.1 10*3/uL (ref 0.0–0.1)
Basophils Relative: 1 %
Eosinophils Absolute: 0.1 10*3/uL (ref 0.0–0.5)
Eosinophils Relative: 0 %
HCT: 41.9 % (ref 39.0–52.0)
Hemoglobin: 13.4 g/dL (ref 13.0–17.0)
Immature Granulocytes: 0 %
Lymphocytes Relative: 13 %
Lymphs Abs: 1.8 10*3/uL (ref 0.7–4.0)
MCH: 28.2 pg (ref 26.0–34.0)
MCHC: 32 g/dL (ref 30.0–36.0)
MCV: 88 fL (ref 80.0–100.0)
Monocytes Absolute: 1.1 10*3/uL — ABNORMAL HIGH (ref 0.1–1.0)
Monocytes Relative: 8 %
Neutro Abs: 11.3 10*3/uL — ABNORMAL HIGH (ref 1.7–7.7)
Neutrophils Relative %: 78 %
Platelets: 203 10*3/uL (ref 150–400)
RBC: 4.76 MIL/uL (ref 4.22–5.81)
RDW: 15.5 % (ref 11.5–15.5)
WBC: 14.5 10*3/uL — ABNORMAL HIGH (ref 4.0–10.5)
nRBC: 0 % (ref 0.0–0.2)

## 2023-01-19 LAB — COMPREHENSIVE METABOLIC PANEL
ALT: 33 U/L (ref 0–44)
AST: 32 U/L (ref 15–41)
Albumin: 2.9 g/dL — ABNORMAL LOW (ref 3.5–5.0)
Alkaline Phosphatase: 110 U/L (ref 38–126)
Anion gap: 9 (ref 5–15)
BUN: 17 mg/dL (ref 8–23)
CO2: 23 mmol/L (ref 22–32)
Calcium: 8.9 mg/dL (ref 8.9–10.3)
Chloride: 103 mmol/L (ref 98–111)
Creatinine, Ser: 1.34 mg/dL — ABNORMAL HIGH (ref 0.61–1.24)
GFR, Estimated: 55 mL/min — ABNORMAL LOW (ref >60)
Glucose, Bld: 83 mg/dL (ref 70–99)
Potassium: 4.7 mmol/L (ref 3.5–5.1)
Sodium: 135 mmol/L (ref 135–145)
Total Bilirubin: 1.2 mg/dL — ABNORMAL HIGH (ref <1.2)
Total Protein: 5.8 g/dL — ABNORMAL LOW (ref 6.5–8.1)

## 2023-01-19 LAB — URINALYSIS, W/ REFLEX TO CULTURE (INFECTION SUSPECTED)
Bacteria, UA: NONE SEEN
Bilirubin Urine: NEGATIVE
Glucose, UA: NEGATIVE mg/dL
Hgb urine dipstick: NEGATIVE
Ketones, ur: NEGATIVE mg/dL
Leukocytes,Ua: NEGATIVE
Nitrite: NEGATIVE
Protein, ur: NEGATIVE mg/dL
Specific Gravity, Urine: 1.01 (ref 1.005–1.030)
pH: 7 (ref 5.0–8.0)

## 2023-01-19 LAB — I-STAT CG4 LACTIC ACID, ED: Lactic Acid, Venous: 2.2 mmol/L (ref 0.5–1.9)

## 2023-01-19 LAB — PROTIME-INR
INR: 1.2 (ref 0.8–1.2)
Prothrombin Time: 15.3 s — ABNORMAL HIGH (ref 11.4–15.2)

## 2023-01-19 LAB — TSH: TSH: 1.157 u[IU]/mL (ref 0.350–4.500)

## 2023-01-19 LAB — RESP PANEL BY RT-PCR (RSV, FLU A&B, COVID)  RVPGX2
Influenza A by PCR: NEGATIVE
Influenza B by PCR: NEGATIVE
Resp Syncytial Virus by PCR: NEGATIVE
SARS Coronavirus 2 by RT PCR: NEGATIVE

## 2023-01-19 LAB — TROPONIN I (HIGH SENSITIVITY)
Troponin I (High Sensitivity): 30 ng/L — ABNORMAL HIGH (ref <18)
Troponin I (High Sensitivity): 26 ng/L — ABNORMAL HIGH (ref <18)

## 2023-01-19 LAB — CK: Total CK: 54 U/L (ref 49–397)

## 2023-01-19 LAB — MRSA NEXT GEN BY PCR, NASAL: MRSA by PCR Next Gen: NOT DETECTED

## 2023-01-19 LAB — T4, FREE: Free T4: 1.3 ng/dL — ABNORMAL HIGH (ref 0.61–1.12)

## 2023-01-19 LAB — PROCALCITONIN: Procalcitonin: 0.55 ng/mL

## 2023-01-19 LAB — BRAIN NATRIURETIC PEPTIDE: B Natriuretic Peptide: 400.4 pg/mL — ABNORMAL HIGH (ref 0.0–100.0)

## 2023-01-19 MED ORDER — HYDROCORTISONE SOD SUC (PF) 100 MG IJ SOLR
50.0000 mg | Freq: Two times a day (BID) | INTRAMUSCULAR | Status: AC
  Administered 2023-01-20 (×2): 50 mg via INTRAVENOUS
  Filled 2023-01-19 (×2): qty 1

## 2023-01-19 MED ORDER — VANCOMYCIN HCL 1.5 G IV SOLR: 1500.0000 mg | INTRAVENOUS | Status: DC

## 2023-01-19 MED ORDER — TAMSULOSIN HCL 0.4 MG PO CAPS
0.4000 mg | ORAL_CAPSULE | Freq: Every morning | ORAL | Status: DC
  Administered 2023-01-20 – 2023-01-21 (×2): 0.4 mg via ORAL
  Filled 2023-01-19 (×2): qty 1

## 2023-01-19 MED ORDER — SODIUM CHLORIDE 0.9 % IV SOLN
500.0000 mg | INTRAVENOUS | Status: DC
  Administered 2023-01-19: 500 mg via INTRAVENOUS
  Filled 2023-01-19: qty 5

## 2023-01-19 MED ORDER — DULOXETINE HCL 30 MG PO CPEP
30.0000 mg | ORAL_CAPSULE | Freq: Every day | ORAL | Status: DC
  Administered 2023-01-19 – 2023-01-21 (×3): 30 mg via ORAL
  Filled 2023-01-19 (×3): qty 1

## 2023-01-19 MED ORDER — GUAIFENESIN ER 600 MG PO TB12
600.0000 mg | ORAL_TABLET | Freq: Two times a day (BID) | ORAL | Status: DC
  Administered 2023-01-19 – 2023-01-21 (×4): 600 mg via ORAL
  Filled 2023-01-19 (×4): qty 1

## 2023-01-19 MED ORDER — PANTOPRAZOLE SODIUM 40 MG PO TBEC
40.0000 mg | DELAYED_RELEASE_TABLET | Freq: Every day | ORAL | Status: DC
  Administered 2023-01-19 – 2023-01-21 (×3): 40 mg via ORAL
  Filled 2023-01-19 (×3): qty 1

## 2023-01-19 MED ORDER — VANCOMYCIN HCL IN DEXTROSE 1-5 GM/200ML-% IV SOLN
1000.0000 mg | Freq: Once | INTRAVENOUS | Status: AC
Start: 2023-01-19 — End: 2023-01-19
  Administered 2023-01-19: 1000 mg via INTRAVENOUS
  Filled 2023-01-19: qty 200

## 2023-01-19 MED ORDER — SUCRALFATE 1 GM/10ML PO SUSP
1.0000 g | Freq: Every morning | ORAL | Status: DC
  Administered 2023-01-20 – 2023-01-21 (×2): 1 g via ORAL
  Filled 2023-01-19 (×2): qty 10

## 2023-01-19 MED ORDER — HYDROCORTISONE SOD SUC (PF) 100 MG IJ SOLR
50.0000 mg | Freq: Once | INTRAMUSCULAR | Status: AC
  Administered 2023-01-19: 50 mg via INTRAVENOUS
  Filled 2023-01-19: qty 2

## 2023-01-19 MED ORDER — FINASTERIDE 5 MG PO TABS
5.0000 mg | ORAL_TABLET | Freq: Every day | ORAL | Status: DC
  Administered 2023-01-20 – 2023-01-21 (×2): 5 mg via ORAL
  Filled 2023-01-19 (×2): qty 1

## 2023-01-19 MED ORDER — ACETAMINOPHEN 650 MG RE SUPP: 650.0000 mg | Freq: Four times a day (QID) | RECTAL | Status: DC | PRN

## 2023-01-19 MED ORDER — ACETAMINOPHEN 325 MG PO TABS: 650.0000 mg | ORAL_TABLET | Freq: Four times a day (QID) | ORAL | Status: DC | PRN

## 2023-01-19 MED ORDER — LEVOTHYROXINE SODIUM 88 MCG PO TABS
88.0000 ug | ORAL_TABLET | Freq: Every day | ORAL | Status: DC
  Administered 2023-01-20 – 2023-01-21 (×2): 88 ug via ORAL
  Filled 2023-01-19 (×2): qty 1

## 2023-01-19 MED ORDER — CEFTRIAXONE SODIUM 2 G IJ SOLR
2.0000 g | Freq: Once | INTRAMUSCULAR | Status: AC
  Administered 2023-01-19: 2 g via INTRAVENOUS
  Filled 2023-01-19: qty 20

## 2023-01-19 MED ORDER — SIMVASTATIN 20 MG PO TABS
20.0000 mg | ORAL_TABLET | Freq: Every day | ORAL | Status: DC
  Administered 2023-01-19 – 2023-01-20 (×2): 20 mg via ORAL
  Filled 2023-01-19 (×2): qty 1

## 2023-01-19 MED ORDER — SODIUM CHLORIDE 0.9% FLUSH
3.0000 mL | Freq: Two times a day (BID) | INTRAVENOUS | Status: DC
  Administered 2023-01-19 – 2023-01-21 (×4): 3 mL via INTRAVENOUS

## 2023-01-19 MED ORDER — OXYCODONE HCL 5 MG PO TABS
5.0000 mg | ORAL_TABLET | Freq: Three times a day (TID) | ORAL | Status: DC | PRN
  Administered 2023-01-20 – 2023-01-21 (×3): 5 mg via ORAL
  Filled 2023-01-19 (×3): qty 1

## 2023-01-19 MED ORDER — CLONAZEPAM 1 MG PO TABS
1.0000 mg | ORAL_TABLET | Freq: Two times a day (BID) | ORAL | Status: DC | PRN
Start: 2023-01-19 — End: 2023-01-21

## 2023-01-19 MED ORDER — CALCIUM CARBONATE 1250 (500 CA) MG PO TABS
1.0000 | ORAL_TABLET | Freq: Every day | ORAL | Status: DC
  Administered 2023-01-20: 1250 mg via ORAL
  Filled 2023-01-19: qty 1

## 2023-01-19 MED ORDER — ENOXAPARIN SODIUM 40 MG/0.4ML IJ SOSY
40.0000 mg | PREFILLED_SYRINGE | INTRAMUSCULAR | Status: DC
  Administered 2023-01-19 – 2023-01-20 (×2): 40 mg via SUBCUTANEOUS
  Filled 2023-01-19 (×2): qty 0.4

## 2023-01-19 MED ORDER — SODIUM CHLORIDE 0.9 % IV SOLN
2.0000 g | INTRAVENOUS | Status: DC
  Administered 2023-01-20: 2 g via INTRAVENOUS
  Filled 2023-01-19: qty 20

## 2023-01-19 MED ORDER — VANCOMYCIN HCL IN DEXTROSE 1-5 GM/200ML-% IV SOLN
1000.0000 mg | Freq: Once | INTRAVENOUS | Status: AC
  Administered 2023-01-19: 1000 mg via INTRAVENOUS
  Filled 2023-01-19: qty 200

## 2023-01-19 MED ORDER — CLOPIDOGREL BISULFATE 75 MG PO TABS
75.0000 mg | ORAL_TABLET | Freq: Every day | ORAL | Status: DC
  Administered 2023-01-20 – 2023-01-21 (×2): 75 mg via ORAL
  Filled 2023-01-19 (×2): qty 1

## 2023-01-19 MED ORDER — SODIUM CHLORIDE 0.9 % IV BOLUS
500.0000 mL | Freq: Once | INTRAVENOUS | Status: AC
  Administered 2023-01-19: 500 mL via INTRAVENOUS

## 2023-01-19 NOTE — Sepsis Progress Note (Signed)
Sepsis protocol monitored by eLink ?

## 2023-01-19 NOTE — Sepsis Progress Note (Signed)
Notified bedside nurse of need to follow up with lab to draw repeat lactic acid via secure chat.Marland Kitchen

## 2023-01-19 NOTE — Progress Notes (Signed)
Pharmacy Antibiotic Note  Maxwell Fox is a 77 y.o. male admitted on 01/19/2023 with sepsis 2/2 pneumonia.  Pharmacy has been consulted for Vancomycin dosing.  Ceftriaxone 2g IV x1, Azithromycin 500mg  IV x1, Vancomycin 2000mg  (1000mg  IV x2) given in ED   WBC 14.5, Tmax 99.7, LA 2.2 Scr 1.34  Plan: Vancomycin 1500mg  IV q24h (eAUC ~529)    > Goal AUC 400-550    > Check vancomycin levels at steady state  Continue Ceftriaxone 2g IV q24h per MD Continue Azithromycin 500mg  IV q24h per MD Monitor daily CBC, temp, SCr, and for clinical signs of improvement  F/u MRSA PCR and de-escalate vancomycin, if negative F/u cultures and de-escalate antibiotics as able   Height: 5\' 11"  (180.3 cm) Weight: 83.9 kg (185 lb) IBW/kg (Calculated) : 75.3  Temp (24hrs), Avg:99.4 F (37.4 C), Min:99.1 F (37.3 C), Max:99.7 F (37.6 C)  Recent Labs  Lab 01/19/23 1116 01/19/23 1658  WBC 14.5*  --   CREATININE 1.34*  --   LATICACIDVEN  --  2.2*    Estimated Creatinine Clearance: 49.2 mL/min (A) (by C-G formula based on SCr of 1.34 mg/dL (H)).    Allergies  Allergen Reactions   Rosuvastatin Other (See Comments)    Leg pains    Antimicrobials this admission: Ceftriaxone 12/17 >>  Azithromycin 12/17 >>  Vancomycin 12/17 >>   Dose adjustments this admission: N/A  Microbiology results: 12/17 MRSA PCR: sent 12/17 Bld cx x2: sent  Thank you for allowing pharmacy to be a part of this patient's care.  Wilburn Cornelia, PharmD, BCPS Clinical Pharmacist 01/19/2023 5:55 PM   Please refer to [facility] for pharmacy phone number

## 2023-01-19 NOTE — ED Triage Notes (Signed)
Pt BIB [facility] from home d/t weakness started today. Not able to get up to the chair. Hx circulation issues. A&O X4. VSS.

## 2023-01-19 NOTE — Progress Notes (Signed)
ED Pharmacy Antibiotic Sign Off An antibiotic consult was received from an ED provider for Vancomycin per pharmacy dosing for Sepsis. A chart review was completed to assess appropriateness.   The following one time order(s) were placed:  Vancomycin 2000mg  IV (given as 1000mg  x2)  Further antibiotic and/or antibiotic pharmacy consults should be ordered by the admitting provider if indicated.   Thank you for allowing pharmacy to be a part of this patient's care.   Maxwell Fox, PharmD, BCPS Clinical Pharmacist 01/19/2023 5:39 PM   Please refer to [facility] for pharmacy phone number

## 2023-01-19 NOTE — Telephone Encounter (Signed)
Call routed directly into triage. States last episode was a week ago. Head gets foggy and "when I speak, it makes no sense. It's not words that come out." Happened four times total over about 4 months. States when trying to get up this morning, he cannot stand up. Noticed this last night and still there this morning and "its worse." Speaks in audible sentences while on phone, but throat is raspy sounding.Complains of sore throat that began last night about 1:30-2am at same time he noticed the muscle weakness. Moved himself to a chair this morning. Did stroke assessment via telephone. He is able to lift bilateral arms and hold with no (reported) drift. He can lift both feet a few inches off the floor and hold, but states "I'm telling you, I know I can't stand up." Patient lives with wife who is in background of call, but cannot talk at this time since she has hearing aids that aren't working. Has daughter who reportedly lives nearby. Advised pt his symptoms are very concerning and it sounds like he's had some TIA's and deficits are getting larger. I explained I was very concerned and the fact that he can't stand is a huge change from baseline. He agreed. He states he will call 911 as soon as we hang up.

## 2023-01-19 NOTE — ED Provider Notes (Signed)
[facility] AT [facility] Provider Note   CSN: [id number] Arrival date & time: 01/19/23  1052     History  Chief Complaint  Patient presents with   Weakness    Saveer Bivens is a 77 y.o. male.  HPI    77 y.o. male with a hx of sinus bradycardia, mild AAS, hypertension, melanoma with brain metastasis, and obesity s/p gastric bypass comes in with chief complaint of generalized weakness and confusion.  Patient accompanied by daughter. According to the patient, over the last several weeks he gets episodes of confusion.  The daughter indicates that by confusion they mean, patient will start talking out of his mind and talk about events or things that have no context for discussion.  The patient indicates that his wife told him that his lips were blue during those events.  This morning, patient came into the ER because he could not get out of bed because of profound weakness.  Daughter states that chest 2 weeks ago patient was participating in Pilates with her.   Patient denies any new cough, runny nose, congestion.  He does have a mild sore throat.  He has no chest pain, shortness of breath, abdominal pain, UTI-like symptoms.  He had an MRI completed recently at [facility] which was reassuring.  Home Medications Prior to Admission medications   Medication Sig Start Date End Date Taking? Authorizing Provider  acetaminophen (TYLENOL) 325 MG tablet Take 2 tablets (650 mg total) by mouth every 6 (six) hours as needed for moderate pain. 04/01/20   Arrien, York Ram, MD  aspirin 81 MG chewable tablet Chew 1 tablet (81 mg total) by mouth daily. 08/13/22   [name], MD  calcium carbonate (OS-CAL - DOSED IN MG OF ELEMENTAL CALCIUM) 1250 (500 Ca) MG tablet Take 1 tablet by mouth daily with supper.    [provider]  Cholecalciferol (VITAMIN D) 2000 UNITS tablet Take 1 tablet (2,000 Units total) by mouth daily. 10/26/13   Newt Lukes,  MD  clonazePAM (KLONOPIN) 1 MG tablet Take 1 tablet (1 mg total) by mouth 2 (two) times daily. 11/20/22   Pincus Sanes, MD  clopidogrel (PLAVIX) 75 MG tablet Take 1 tablet (75 mg total) by mouth daily with breakfast. 09/02/22   Swinyer, Zachary George, NP  DULoxetine (CYMBALTA) 30 MG capsule Take 1 capsule (30 mg total) by mouth daily. 11/20/22   Pincus Sanes, MD  finasteride (PROSCAR) 5 MG tablet TAKE 1 TABLET(5 MG) BY MOUTH DAILY 11/10/22   Pincus Sanes, MD  levothyroxine (SYNTHROID) 88 MCG tablet TAKE 1 TABLET BY MOUTH EVERY DAY BEFORE BREAKFAST 12/30/22   Carlus Pavlov, MD  Multiple Vitamin (MULTIVITAMIN WITH MINERALS) TABS tablet Take 1 tablet by mouth in the morning.    [provider]  ondansetron (ZOFRAN) 4 MG tablet TAKE 1 TABLET(4 MG) BY MOUTH EVERY 8 HOURS AS NEEDED FOR NAUSEA OR VOMITING 04/20/22   Burns, Bobette Mo, MD  oxyCODONE (ROXICODONE) 5 MG immediate release tablet Take 1 tablet (5 mg total) by mouth every 8 (eight) hours as needed for severe pain (pain score 7-10) (chronic back pain). MDD 3 01/05/23   Pincus Sanes, MD  pantoprazole (PROTONIX) 40 MG tablet Take 1 tablet (40 mg total) by mouth daily. Please follow up with primary care provider for additional refills. 09/02/22   Swinyer, Zachary George, NP  predniSONE (DELTASONE) 5 MG tablet TAKE 1 TABLET BY MOUTH EVERY DAY WITH BREAKFAST 06/01/22  Carlus Pavlov, MD  simvastatin (ZOCOR) 20 MG tablet Take 1 tablet (20 mg total) by mouth at bedtime. 11/06/22   [name], MD  sucralfate (CARAFATE) 1 GM/10ML suspension Take 1 g by mouth in the morning.    [provider]  tamsulosin (FLOMAX) 0.4 MG CAPS capsule TAKE 1 CAPSULE BY MOUTH EVERY DAY AFTER SUPPER Patient taking differently: Take 0.4 mg by mouth in the morning. 04/20/22   Pincus Sanes, MD      Allergies    Rosuvastatin    Review of Systems   Review of Systems  All other systems reviewed and are negative.   Physical Exam Updated Vital  Signs BP 132/75   Pulse 87   Temp 99.1 F (37.3 C) (Oral)   Resp (!) 21   Ht 5\' 11"  (1.803 m)   Wt 83.9 kg   SpO2 97%   BMI 25.80 kg/m  Physical Exam Vitals and nursing note reviewed.  Constitutional:      Appearance: He is well-developed.  HENT:     Head: Atraumatic.  Eyes:     Extraocular Movements: Extraocular movements intact.     Pupils: Pupils are equal, round, and reactive to light.  Cardiovascular:     Rate and Rhythm: Normal rate.  Pulmonary:     Effort: Pulmonary effort is normal.  Abdominal:     Tenderness: There is no abdominal tenderness.  Musculoskeletal:     Cervical back: Neck supple.  Skin:    General: Skin is warm.  Neurological:     Mental Status: He is alert and oriented to person, place, and time.     Cranial Nerves: No cranial nerve deficit.     Sensory: No sensory deficit.     Motor: No weakness.     ED Results / Procedures / Treatments   Labs (all labs ordered are listed, but only abnormal results are displayed) Labs Reviewed  CBC WITH DIFFERENTIAL/PLATELET - Abnormal; Notable for the following components:      Result Value   WBC 14.5 (*)    Neutro Abs 11.3 (*)    Monocytes Absolute 1.1 (*)    All other components within normal limits  COMPREHENSIVE METABOLIC PANEL - Abnormal; Notable for the following components:   Creatinine, Ser 1.34 (*)    Total Protein 5.8 (*)    Albumin 2.9 (*)    Total Bilirubin 1.2 (*)    GFR, Estimated 55 (*)    All other components within normal limits  PROTIME-INR - Abnormal; Notable for the following components:   Prothrombin Time 15.3 (*)    All other components within normal limits  TROPONIN I (HIGH SENSITIVITY) - Abnormal; Notable for the following components:   Troponin I (High Sensitivity) 30 (*)    All other components within normal limits  TROPONIN I (HIGH SENSITIVITY) - Abnormal; Notable for the following components:   Troponin I (High Sensitivity) 26 (*)    All other components within normal  limits  RESP PANEL BY RT-PCR (RSV, FLU A&B, COVID)  RVPGX2  CULTURE, BLOOD (ROUTINE X 2)  CULTURE, BLOOD (ROUTINE X 2)  URINALYSIS, W/ REFLEX TO CULTURE (INFECTION SUSPECTED)  CK  I-STAT CG4 LACTIC ACID, ED    EKG None  Radiology DG Chest Port 1 View Result Date: 01/19/2023 CLINICAL DATA:  Weakness. EXAM: PORTABLE CHEST 1 VIEW COMPARISON:  April 01, 2020. FINDINGS: The heart size and mediastinal contours are within normal limits. Mild bibasilar opacities are noted concerning for subsegmental atelectasis  or possibly infiltrates, left greater than right. The visualized skeletal structures are unremarkable. IMPRESSION: Mild bibasilar subsegmental atelectasis or infiltrates as noted above. Electronically Signed   By: Lupita Raider M.D.   On: 01/19/2023 14:51    Procedures .Critical Care  Performed by: Derwood Kaplan, MD Authorized by: Derwood Kaplan, MD   Critical care provider statement:    Critical care time (minutes):  58   Critical care was necessary to treat or prevent imminent or life-threatening deterioration of the following conditions:  Endocrine crisis (Severe weakness, possible adrenal insufficiency)   Critical care was time spent personally by me on the following activities:  Development of treatment plan with patient or surrogate, discussions with consultants, evaluation of patient's response to treatment, examination of patient, ordering and review of laboratory studies, ordering and review of radiographic studies, ordering and performing treatments and interventions, pulse oximetry, re-evaluation of patient's condition, review of old charts and obtaining history from patient or surrogate     Medications Ordered in ED Medications  sodium chloride 0.9 % bolus 500 mL (has no administration in time range)  vancomycin (VANCOCIN) IVPB 1000 mg/200 mL premix (has no administration in time range)  cefTRIAXone (ROCEPHIN) 2 g in sodium chloride 0.9 % 100 mL IVPB (has no  administration in time range)    ED Course/ Medical Decision Making/ A&P                                 Medical Decision Making Amount and/or Complexity of Data Reviewed Labs: ordered. Radiology: ordered. ECG/medicine tests: ordered.  Risk Prescription drug management.   77 year old patient with pertinent past medical history of melanoma with brain metastases, history of gastric bypass, CHF and he comes in with chief complaint of acute altered mental status change, generalized weakness.  Collateral history provided by patient's daughter.    I also reviewed patient's records including discharge summary from earlier this year, outpatient records at [facility] including MRI from just few days ago that revealed reassuring brain scan.  Differential diagnosis considered for this patient includes: ICH / Stroke, worsening brain metastases, acute coronary syndrome, Infection - UTI/Pneumonia/soft tissue infection leading to encephalopathy, encephalopathy due to electrolyte abnormality or drug interactions or toxins or metabolic conditions like adrenal insufficiency, hyperglycemia, paraneoplastic process, Hypercapnia / COPD, Hypoxia  Based on initial assessment, higher suspicion for infectious process including COVID-19, severe electrolyte abnormality, dehydration.  He will get basic labs and reassess. I am unsure what to make of the central cyanosis that patient mentions during history.  It is possible that he is having hypoxemia leading to cyanosis in both confusion during those episodes.  We will perform ambulatory pulse ox if his workup is reassuring.  Plan is to get basic labs and x-ray of the chest. Given that the MRI was reassuring just 3 days back and patient is not having any focal neurodeficits right now, I do not think CT scan of the brain is needed.  Reassessment: I have independently reviewed the following labs: Patient has evidence of slight leukocytosis.  His troponins are flat,  including delta.  Patient has normal renal function. X-ray was independently interpreted.  There is some left-sided haziness. Daughter is at the bedside now.  States that patient could not get out of the bed at this time at all.  In the past he had MI with a chief complaint of generalized weakness.  Patient is also on prednisone  because of adrenal insufficiency.  I try to get the patient up, and he was unable to get up on his own power today.  He indicates that in the last 3 days his weakness has worsened.  At this time I am concerned that he might have some mild adrenal insufficiency.  It is possible that he has an occult infection that could be driving it, possible occult pneumonia.  Earlier this year patient did have a CT PE which was negative.  I do not think PE is high enough and differential to repeat CT angio or get D-dimer at this time.  Plan is to start antibiotics and admit patient for additional workup for generalized weakness.   Final Clinical Impression(s) / ED Diagnoses Final diagnoses:  Community acquired pneumonia of left lower lobe of lung    Rx / DC Orders ED Discharge Orders     None         Derwood Kaplan, MD 01/19/23 1626

## 2023-01-19 NOTE — ED Notes (Signed)
ED TO INPATIENT HANDOFF REPORT  ED Nurse Name and Phone #: Murlean Iba Paramedic [phone]  S Name/Age/Gender Maxwell Fox 77 y.o. male Room/Bed: 019C/019C  Code Status   Code Status: Full Code  Home/SNF/Other Home Patient oriented to: self, place, time, and situation Is this baseline? Yes   Triage Complete: Triage complete  Chief Complaint Sepsis due to pneumonia (HCC) [J18.9, A41.9]  Triage Note Pt BIB [facility] from home d/t weakness started today. Not able to get up to the chair. Hx circulation issues. A&O X4. VSS.    Allergies Allergies  Allergen Reactions   Rosuvastatin Other (See Comments)    Leg pains    Level of Care/Admitting Diagnosis ED Disposition     ED Disposition  Admit   Condition  --   Comment  Hospital Area: [facility] [[id number]]  Level of Care: Telemetry Medical [104]  May admit patient to [facility] or [facility] if equivalent level of care is available:: No  Covid Evaluation: Asymptomatic - no recent exposure (last 10 days) testing not required  Diagnosis: Sepsis due to pneumonia [facility]) [[id number]]  Admitting Physician: Clydie Braun [[id number]]  Attending Physician: Clydie Braun [[id number]]  Certification:: I certify this patient will need inpatient services for at least 2 midnights  Expected Medical Readiness: 01/21/2023          B Medical/Surgery History Past Medical History:  Diagnosis Date   B12 nutritional deficiency    malabsorbtion   Blood transfusion without reported diagnosis 2022   BPH (benign prostatic hypertrophy)    Brain cancer (HCC) 2017   removed "golf ball sized mass"   Depression    on meds   Diverticulosis    GERD (gastroesophageal reflux disease)    on meds   GI bleeding 02/2020   Hypertension    Iron deficiency anemia    malabsorption related (s/p gastric bypass)   Melanoma (HCC)    Obesity    s/p gastric bypass 2000, start 355#   Pancreatic cyst    Pulmonary nodules    Thyroid disease     on meds   Past Surgical History:  Procedure Laterality Date   CHOLECYSTECTOMY  2000   COLONOSCOPY WITH PROPOFOL N/A 03/05/2020   Procedure: COLONOSCOPY WITH PROPOFOL;  Surgeon: Lemar Lofty., MD;  Location[facility] ENDOSCOPY;  Service: Gastroenterology;  Laterality: N/A;   CORONARY STENT INTERVENTION N/A 08/13/2022   Procedure: CORONARY STENT INTERVENTION;  Surgeon: [name], MD;  Location: [facility] INVASIVE CV LAB;  Service: Cardiovascular;  Laterality: N/A;   CRANIOTOMY FOR TUMOR  12/11/2015   metastatic melanoma   ENTEROSCOPY N/A 03/30/2020   Procedure: ENTEROSCOPY;  Surgeon: Hilarie Fredrickson, MD;  Location: [facility] ENDOSCOPY;  Service: Endoscopy;  Laterality: N/A;   ESOPHAGOGASTRODUODENOSCOPY N/A 03/30/2020   Procedure: ESOPHAGOGASTRODUODENOSCOPY (EGD);  Surgeon: Hilarie Fredrickson, MD;  Location: [facility] ENDOSCOPY;  Service: Endoscopy;  Laterality: N/A;   ESOPHAGOGASTRODUODENOSCOPY (EGD) WITH PROPOFOL N/A 02/14/2020   Procedure: ESOPHAGOGASTRODUODENOSCOPY (EGD) WITH PROPOFOL;  Surgeon: Shellia Cleverly, DO;  Location: [facility] ENDOSCOPY;  Service: Endoscopy;  Laterality: N/A;   ESOPHAGOGASTRODUODENOSCOPY (EGD) WITH PROPOFOL N/A 02/26/2020   Procedure: ESOPHAGOGASTRODUODENOSCOPY (EGD) WITH PROPOFOL;  Surgeon: Hilarie Fredrickson, MD;  Location: [facility] ENDOSCOPY;  Service: Endoscopy;  Laterality: N/A;   ESOPHAGOGASTRODUODENOSCOPY (EGD) WITH PROPOFOL N/A 03/03/2020   Procedure: ESOPHAGOGASTRODUODENOSCOPY (EGD) WITH PROPOFOL;  Surgeon: Jeani Hawking, MD;  Location: [facility] ENDOSCOPY;  Service: Endoscopy;  Laterality: N/A;   ESOPHAGOGASTRODUODENOSCOPY (EGD) WITH PROPOFOL N/A 03/28/2020  Procedure: ESOPHAGOGASTRODUODENOSCOPY (EGD) WITH PROPOFOL;  Surgeon: Hilarie Fredrickson, MD;  Location: [facility] ENDOSCOPY;  Service: Endoscopy;  Laterality: N/A;   FOREIGN BODY REMOVAL  03/30/2020   Procedure: FOREIGN BODY REMOVAL;  Surgeon: Hilarie Fredrickson, MD;  Location: [facility] ENDOSCOPY;  Service: Endoscopy;;   GIVENS CAPSULE STUDY N/A 03/28/2020    Procedure: GIVENS CAPSULE STUDY;  Surgeon: Hilarie Fredrickson, MD;  Location: [facility] ENDOSCOPY;  Service: Endoscopy;  Laterality: N/A;   HEMOSTASIS CONTROL  02/26/2020   Procedure: HEMOSTASIS CONTROL;  Surgeon: Hilarie Fredrickson, MD;  Location: [facility] ENDOSCOPY;  Service: Endoscopy;;   HEMOSTASIS CONTROL  03/30/2020   Procedure: HEMOSTASIS CONTROL;  Surgeon: Hilarie Fredrickson, MD;  Location: [facility] ENDOSCOPY;  Service: Endoscopy;;   HOT HEMOSTASIS N/A 02/26/2020   Procedure: HOT HEMOSTASIS (ARGON PLASMA COAGULATION/BICAP);  Surgeon: Hilarie Fredrickson, MD;  Location: [facility] ENDOSCOPY;  Service: Endoscopy;  Laterality: N/A;   MOHS SURGERY  2012   melanoma, mid back, [place]   RIGHT/LEFT HEART CATH AND CORONARY ANGIOGRAPHY N/A 08/13/2022   Procedure: RIGHT/LEFT HEART CATH AND CORONARY ANGIOGRAPHY;  Surgeon: [name], MD;  Location: [facility] INVASIVE CV LAB;  Service: Cardiovascular;  Laterality: N/A;   ROUX-EN-Y PROCEDURE  2000   TOTAL SHOULDER REPLACEMENT Right 2011   [place]   WISDOM TOOTH EXTRACTION       A IV Location/Drains/Wounds Patient Lines/Drains/Airways Status     Active Line/Drains/Airways     Name Placement date Placement time Site Days   Peripheral IV 01/19/23 20 G 1" Anterior;Right Forearm 01/19/23  1115  Forearm  less than 1   Peripheral IV 01/19/23 18 G Posterior;Right Hand 01/19/23  1650  Hand  less than 1            Intake/Output Last 24 hours No intake or output data in the 24 hours ending 01/19/23 1709  Labs/Imaging Results for orders placed or performed during the hospital encounter of 01/19/23 (from the past 48 hours)  CBC with Differential     Status: Abnormal   Collection Time: 01/19/23 11:16 AM  Result Value Ref Range   WBC 14.5 (H) 4.0 - 10.5 K/uL   RBC 4.76 4.22 - 5.81 MIL/uL   Hemoglobin 13.4 13.0 - 17.0 g/dL   HCT 16.1 09.6 - 04.5 %   MCV 88.0 80.0 - 100.0 fL   MCH 28.2 26.0 - 34.0 pg   MCHC 32.0 30.0 - 36.0 g/dL   RDW 40.9 81.1 - 91.4 %   Platelets 203 150 - 400  K/uL   nRBC 0.0 0.0 - 0.2 %   Neutrophils Relative % 78 %   Neutro Abs 11.3 (H) 1.7 - 7.7 K/uL   Lymphocytes Relative 13 %   Lymphs Abs 1.8 0.7 - 4.0 K/uL   Monocytes Relative 8 %   Monocytes Absolute 1.1 (H) 0.1 - 1.0 K/uL   Eosinophils Relative 0 %   Eosinophils Absolute 0.1 0.0 - 0.5 K/uL   Basophils Relative 1 %   Basophils Absolute 0.1 0.0 - 0.1 K/uL   Immature Granulocytes 0 %   Abs Immature Granulocytes 0.06 0.00 - 0.07 K/uL    Comment: Performed at [facility], [address] [place], [place] [address]  Comprehensive metabolic panel     Status: Abnormal   Collection Time: 01/19/23 11:16 AM  Result Value Ref Range   Sodium 135 135 - 145 mmol/L   Potassium 4.7 3.5 - 5.1 mmol/L   Chloride 103 98 - 111 mmol/L  CO2 23 22 - 32 mmol/L   Glucose, Bld 83 70 - 99 mg/dL    Comment: Glucose reference range applies only to samples taken after fasting for at least 8 hours.   BUN 17 8 - 23 mg/dL   Creatinine, Ser 1.61 (H) 0.61 - 1.24 mg/dL   Calcium 8.9 8.9 - 09.6 mg/dL   Total Protein 5.8 (L) 6.5 - 8.1 g/dL   Albumin 2.9 (L) 3.5 - 5.0 g/dL   AST 32 15 - 41 U/L   ALT 33 0 - 44 U/L   Alkaline Phosphatase 110 38 - 126 U/L   Total Bilirubin 1.2 (H) <1.2 mg/dL   GFR, Estimated 55 (L) >60 mL/min    Comment: (NOTE) Calculated using the CKD-EPI Creatinine Equation (2021)    Anion gap 9 5 - 15    Comment: Performed at [facility], [address] [place], [place] [address]  Resp panel by RT-PCR (RSV, Flu A&B, Covid) Anterior Nasal Swab     Status: None   Collection Time: 01/19/23 12:21 PM   Specimen: Anterior Nasal Swab  Result Value Ref Range   SARS Coronavirus 2 by RT PCR NEGATIVE NEGATIVE   Influenza A by PCR NEGATIVE NEGATIVE   Influenza B by PCR NEGATIVE NEGATIVE    Comment: (NOTE) The Xpert Xpress SARS-CoV-2/FLU/RSV plus assay is intended as an aid in the diagnosis of influenza from Nasopharyngeal swab specimens and should not be used as a sole basis for  treatment. Nasal washings and aspirates are unacceptable for Xpert Xpress SARS-CoV-2/FLU/RSV testing.  Fact Sheet for Patients: BloggerCourse.com  Fact Sheet for Healthcare Providers: SeriousBroker.it  This test is not yet approved or cleared by the [place] FDA and has been authorized for detection and/or diagnosis of SARS-CoV-2 by FDA under an Emergency Use Authorization (EUA). This EUA will remain in effect (meaning this test can be used) for the duration of the COVID-19 declaration under Section 564(b)(1) of the Act, 21 U.S.C. section 360bbb-3(b)(1), unless the authorization is terminated or revoked.     Resp Syncytial Virus by PCR NEGATIVE NEGATIVE    Comment: (NOTE) Fact Sheet for Patients: BloggerCourse.com  Fact Sheet for Healthcare Providers: SeriousBroker.it  This test is not yet approved or cleared by the [place] FDA and has been authorized for detection and/or diagnosis of SARS-CoV-2 by FDA under an Emergency Use Authorization (EUA). This EUA will remain in effect (meaning this test can be used) for the duration of the COVID-19 declaration under Section 564(b)(1) of the Act, 21 U.S.C. section 360bbb-3(b)(1), unless the authorization is terminated or revoked.  Performed at [facility], [address], [place], [place] [address]   Troponin I (High Sensitivity)     Status: Abnormal   Collection Time: 01/19/23 12:21 PM  Result Value Ref Range   Troponin I (High Sensitivity) 30 (H) <18 ng/L    Comment: (NOTE) Elevated high sensitivity troponin I (hsTnI) values and significant  changes across serial measurements may suggest ACS but many other  chronic and acute conditions are known to elevate hsTnI results.  Refer to the "Links" section for chest pain algorithms and additional  guidance. Performed at [facility], [address]  [place], [place] [address]   Protime-INR     Status: Abnormal   Collection Time: 01/19/23 12:21 PM  Result Value Ref Range   Prothrombin Time 15.3 (H) 11.4 - 15.2 seconds   INR 1.2 0.8 - 1.2    Comment: (NOTE)  INR goal varies based on device and disease states. Performed at [facility], [address], [place], [place] [address]   Urinalysis, w/ Reflex to Culture (Infection Suspected) -Urine, Clean Catch     Status: None   Collection Time: 01/19/23 12:21 PM  Result Value Ref Range   Specimen Source URINE, CLEAN CATCH    Color, Urine YELLOW YELLOW   APPearance CLEAR CLEAR   Specific Gravity, Urine 1.010 1.005 - 1.030   pH 7.0 5.0 - 8.0   Glucose, UA NEGATIVE NEGATIVE mg/dL   Hgb urine dipstick NEGATIVE NEGATIVE   Bilirubin Urine NEGATIVE NEGATIVE   Ketones, ur NEGATIVE NEGATIVE mg/dL   Protein, ur NEGATIVE NEGATIVE mg/dL   Nitrite NEGATIVE NEGATIVE   Leukocytes,Ua NEGATIVE NEGATIVE   RBC / HPF 0-5 0 - 5 RBC/hpf   WBC, UA 0-5 0 - 5 WBC/hpf    Comment:        Reflex urine culture not performed if WBC <=10, OR if Squamous epithelial cells >5. If Squamous epithelial cells >5 suggest recollection.    Bacteria, UA NONE SEEN NONE SEEN   Squamous Epithelial / HPF 0-5 0 - 5 /HPF    Comment: Performed at [facility], [address], [place], [place] [address]  CK     Status: None   Collection Time: 01/19/23 12:21 PM  Result Value Ref Range   Total CK 54 49 - 397 U/L    Comment: Performed at [facility], [address], [place], [place] [address]  Troponin I (High Sensitivity)     Status: Abnormal   Collection Time: 01/19/23  2:10 PM  Result Value Ref Range   Troponin I (High Sensitivity) 26 (H) <18 ng/L    Comment: (NOTE) Elevated high sensitivity troponin I (hsTnI) values and significant  changes across serial measurements may suggest ACS but many other  chronic and acute conditions are known to elevate hsTnI results.  Refer to the "Links" section for chest  pain algorithms and additional  guidance. Performed at [facility], [address] [place], [place] [address]   I-Stat Lactic Acid, ED     Status: Abnormal   Collection Time: 01/19/23  4:58 PM  Result Value Ref Range   Lactic Acid, Venous 2.2 (HH) 0.5 - 1.9 mmol/L   Comment NOTIFIED PHYSICIAN    DG Chest Port 1 View Result Date: 01/19/2023 CLINICAL DATA:  Weakness. EXAM: PORTABLE CHEST 1 VIEW COMPARISON:  April 01, 2020. FINDINGS: The heart size and mediastinal contours are within normal limits. Mild bibasilar opacities are noted concerning for subsegmental atelectasis or possibly infiltrates, left greater than right. The visualized skeletal structures are unremarkable. IMPRESSION: Mild bibasilar subsegmental atelectasis or infiltrates as noted above. Electronically Signed   By: Lupita Raider M.D.   On: 01/19/2023 14:51    Pending Labs Unresulted Labs (From admission, onward)     Start     Ordered   01/20/23 0500  CBC  Tomorrow morning,   R        01/19/23 1649   01/20/23 0500  Basic metabolic panel  Tomorrow morning,   R        01/19/23 1649   01/19/23 1652  Legionella Pneumophila Serogp 1 Ur Ag  (COPD / Pneumonia / Cellulitis / Lower Extremity Wound)  Add-on,   AD        01/19/23 1651   01/19/23 1652  Strep pneumoniae urinary antigen  (COPD / Pneumonia /  Cellulitis / Lower Extremity Wound)  Add-on,   AD        01/19/23 1651   01/19/23 1645  MRSA Next Gen by PCR, Nasal  Once,   R        01/19/23 1649   01/19/23 1625  TSH  Once,   URGENT        01/19/23 1626   01/19/23 1625  T4, free  Once,   URGENT        01/19/23 1626   01/19/23 1621  Blood Culture (routine x 2)  (Septic presentation on arrival (screening labs, nursing and treatment orders for obvious sepsis))  BLOOD CULTURE X 2,   STAT      01/19/23 1621            Vitals/Pain Today's Vitals   01/19/23 1515 01/19/23 1530 01/19/23 1700 01/19/23 1708  BP: 133/70 132/75 128/78   Pulse: 88 87 89   Resp: 17  (!) 21 14   Temp:    99.7 F (37.6 C)  TempSrc:    Oral  SpO2: 97% 97% 94%   Weight:      Height:      PainSc:        Isolation Precautions No active isolations  Medications Medications  vancomycin (VANCOCIN) IVPB 1000 mg/200 mL premix (1,000 mg Intravenous New Bag/Given 01/19/23 1700)  cefTRIAXone (ROCEPHIN) 2 g in sodium chloride 0.9 % 100 mL IVPB (2 g Intravenous New Bag/Given 01/19/23 1703)  enoxaparin (LOVENOX) injection 40 mg (has no administration in time range)  levothyroxine (SYNTHROID) tablet 88 mcg (has no administration in time range)  cefTRIAXone (ROCEPHIN) 2 g in sodium chloride 0.9 % 100 mL IVPB (has no administration in time range)  azithromycin (ZITHROMAX) 500 mg in sodium chloride 0.9 % 250 mL IVPB (has no administration in time range)  sodium chloride flush (NS) 0.9 % injection 3 mL (has no administration in time range)  acetaminophen (TYLENOL) tablet 650 mg (has no administration in time range)    Or  acetaminophen (TYLENOL) suppository 650 mg (has no administration in time range)  guaiFENesin (MUCINEX) 12 hr tablet 600 mg (has no administration in time range)  sodium chloride 0.9 % bolus 500 mL (500 mLs Intravenous New Bag/Given 01/19/23 1658)  hydrocortisone sodium succinate (SOLU-CORTEF) 100 MG injection 50 mg (50 mg Intravenous Given 01/19/23 1700)    Mobility manual wheelchair     Focused Assessments Generalized weakness- Dx of pneumonia. No SHOB noted.  On Room air   R Recommendations: See Admitting Provider Note  Report given to:   Additional Notes:

## 2023-01-19 NOTE — H&P (Signed)
History and Physical    Patient: Maxwell Fox TDD:[id number] DOB: 1945-11-02 DOA: 01/19/2023 DOS: the patient was seen and examined on 01/19/2023 PCP: Pincus Sanes, MD  Patient coming from: Home  Chief Complaint:  Chief Complaint  Patient presents with   Weakness   HPI: Maxwell Fox is a 77 y.o. male with medical history significant of hypertension, metastatic melanoma in remission, hypothyroidism, depression, iron deficiency anemia, BPH, history of GI bleed, s/p gastric bypass, and GERD who presents with sudden onset of generalized weakness. He reports being unable to get up from his bed, requiring assistance from his wife to move to a chair.  Normally able to ambulate without need of assistance he denies any recent cough, shortness of breath, stomach pain, nausea, vomiting, or diarrhea. He reports difficulty in lifting his leg and weakness in his arms. He also mentions episodes of mental confusion, during which he was disoriented and had difficulty responding to questions. He has been experiencing these episodes of confusion intermittently. He also reports recent issues with urinary incontinence, stating that he has had difficulty reaching the toilet in time during the night.  Patient just recently had MRI of the brain at [facility] on 12/13 due to reports of increasing confusion.  Which noted stable appearance of multiple treated lesions, stable left temporal lobe enhancing lesion, and no evidence of new intracranial metastases.   In the emergency department patient was noted to be afebrile with respirations elevated up to 30, and all other vital signs relatively maintained.  Labs significant for WBC 14.5.  Chest x-ray significant for mild bibasilar segmental atelectasis or infiltrates noted to be greater on the left than on the right.  Urinalysis showed no signs for infection.  Patient had been given 500 mL bolus of fluids, vancomycin, and Rocephin.   Review of Systems: As mentioned in the  history of present illness. All other systems reviewed and are negative. Past Medical History:  Diagnosis Date   B12 nutritional deficiency    malabsorbtion   Blood transfusion without reported diagnosis 2022   BPH (benign prostatic hypertrophy)    Brain cancer (HCC) 2017   removed "golf ball sized mass"   Depression    on meds   Diverticulosis    GERD (gastroesophageal reflux disease)    on meds   GI bleeding 02/2020   Hypertension    Iron deficiency anemia    malabsorption related (s/p gastric bypass)   Melanoma (HCC)    Obesity    s/p gastric bypass 2000, start 355#   Pancreatic cyst    Pulmonary nodules    Thyroid disease    on meds   Past Surgical History:  Procedure Laterality Date   CHOLECYSTECTOMY  2000   COLONOSCOPY WITH PROPOFOL N/A 03/05/2020   Procedure: COLONOSCOPY WITH PROPOFOL;  Surgeon: Lemar Lofty., MD;  Location: [facility] ENDOSCOPY;  Service: Gastroenterology;  Laterality: N/A;   CORONARY STENT INTERVENTION N/A 08/13/2022   Procedure: CORONARY STENT INTERVENTION;  Surgeon: [name], MD;  Location: [facility] INVASIVE CV LAB;  Service: Cardiovascular;  Laterality: N/A;   CRANIOTOMY FOR TUMOR  12/11/2015   metastatic melanoma   ENTEROSCOPY N/A 03/30/2020   Procedure: ENTEROSCOPY;  Surgeon: Hilarie Fredrickson, MD;  Location: [facility] ENDOSCOPY;  Service: Endoscopy;  Laterality: N/A;   ESOPHAGOGASTRODUODENOSCOPY N/A 03/30/2020   Procedure: ESOPHAGOGASTRODUODENOSCOPY (EGD);  Surgeon: Hilarie Fredrickson, MD;  Location: [facility] ENDOSCOPY;  Service: Endoscopy;  Laterality: N/A;   ESOPHAGOGASTRODUODENOSCOPY (EGD) WITH PROPOFOL N/A 02/14/2020   Procedure: ESOPHAGOGASTRODUODENOSCOPY (  EGD) WITH PROPOFOL;  Surgeon: Shellia Cleverly, DO;  Location: [facility] ENDOSCOPY;  Service: Endoscopy;  Laterality: N/A;   ESOPHAGOGASTRODUODENOSCOPY (EGD) WITH PROPOFOL N/A 02/26/2020   Procedure: ESOPHAGOGASTRODUODENOSCOPY (EGD) WITH PROPOFOL;  Surgeon: Hilarie Fredrickson, MD;  Location: [facility] ENDOSCOPY;   Service: Endoscopy;  Laterality: N/A;   ESOPHAGOGASTRODUODENOSCOPY (EGD) WITH PROPOFOL N/A 03/03/2020   Procedure: ESOPHAGOGASTRODUODENOSCOPY (EGD) WITH PROPOFOL;  Surgeon: Jeani Hawking, MD;  Location: [facility] ENDOSCOPY;  Service: Endoscopy;  Laterality: N/A;   ESOPHAGOGASTRODUODENOSCOPY (EGD) WITH PROPOFOL N/A 03/28/2020   Procedure: ESOPHAGOGASTRODUODENOSCOPY (EGD) WITH PROPOFOL;  Surgeon: Hilarie Fredrickson, MD;  Location: [facility] ENDOSCOPY;  Service: Endoscopy;  Laterality: N/A;   FOREIGN BODY REMOVAL  03/30/2020   Procedure: FOREIGN BODY REMOVAL;  Surgeon: Hilarie Fredrickson, MD;  Location: [facility] ENDOSCOPY;  Service: Endoscopy;;   GIVENS CAPSULE STUDY N/A 03/28/2020   Procedure: GIVENS CAPSULE STUDY;  Surgeon: Hilarie Fredrickson, MD;  Location: [facility] ENDOSCOPY;  Service: Endoscopy;  Laterality: N/A;   HEMOSTASIS CONTROL  02/26/2020   Procedure: HEMOSTASIS CONTROL;  Surgeon: Hilarie Fredrickson, MD;  Location: [facility] ENDOSCOPY;  Service: Endoscopy;;   HEMOSTASIS CONTROL  03/30/2020   Procedure: HEMOSTASIS CONTROL;  Surgeon: Hilarie Fredrickson, MD;  Location: [facility] ENDOSCOPY;  Service: Endoscopy;;   HOT HEMOSTASIS N/A 02/26/2020   Procedure: HOT HEMOSTASIS (ARGON PLASMA COAGULATION/BICAP);  Surgeon: Hilarie Fredrickson, MD;  Location: [facility] ENDOSCOPY;  Service: Endoscopy;  Laterality: N/A;   MOHS SURGERY  2012   melanoma, mid back, [place]   RIGHT/LEFT HEART CATH AND CORONARY ANGIOGRAPHY N/A 08/13/2022   Procedure: RIGHT/LEFT HEART CATH AND CORONARY ANGIOGRAPHY;  Surgeon: [name], MD;  Location: [facility] INVASIVE CV LAB;  Service: Cardiovascular;  Laterality: N/A;   ROUX-EN-Y PROCEDURE  2000   TOTAL SHOULDER REPLACEMENT Right 2011   [place]   WISDOM TOOTH EXTRACTION     Social History:  reports that he quit smoking about 60 years ago. His smoking use included cigarettes. He has never used smokeless tobacco. He reports that he does not currently use alcohol. He reports that he does not use drugs.  Allergies  Allergen Reactions    Rosuvastatin Other (See Comments)    Leg pains    Family History  Problem Relation Age of Onset   Hypertension Father    Macular degeneration Mother    Melanoma Mother    Melanoma Sister    Stomach cancer Sister 53       duodenal   Breast cancer Sister    Uterine cancer Sister    Melanoma Brother    Colon polyps Neg Hx    Colon cancer Neg Hx    Esophageal cancer Neg Hx    Rectal cancer Neg Hx     Prior to Admission medications   Medication Sig Start Date End Date Taking? Authorizing Provider  acetaminophen (TYLENOL) 325 MG tablet Take 2 tablets (650 mg total) by mouth every 6 (six) hours as needed for moderate pain. 04/01/20   Arrien, York Ram, MD  aspirin 81 MG chewable tablet Chew 1 tablet (81 mg total) by mouth daily. 08/13/22   [name], MD  calcium carbonate (OS-CAL - DOSED IN MG OF ELEMENTAL CALCIUM) 1250 (500 Ca) MG tablet Take 1 tablet by mouth daily with supper.    [provider]  Cholecalciferol (VITAMIN D) 2000 UNITS tablet Take 1 tablet (2,000 Units total) by mouth daily. 10/26/13   Newt Lukes, MD  clonazePAM (KLONOPIN) 1 MG tablet Take 1 tablet (1 mg  total) by mouth 2 (two) times daily. 11/20/22   Pincus Sanes, MD  clopidogrel (PLAVIX) 75 MG tablet Take 1 tablet (75 mg total) by mouth daily with breakfast. 09/02/22   Swinyer, Zachary George, NP  DULoxetine (CYMBALTA) 30 MG capsule Take 1 capsule (30 mg total) by mouth daily. 11/20/22   Pincus Sanes, MD  finasteride (PROSCAR) 5 MG tablet TAKE 1 TABLET(5 MG) BY MOUTH DAILY 11/10/22   Pincus Sanes, MD  levothyroxine (SYNTHROID) 88 MCG tablet TAKE 1 TABLET BY MOUTH EVERY DAY BEFORE BREAKFAST 12/30/22   Carlus Pavlov, MD  Multiple Vitamin (MULTIVITAMIN WITH MINERALS) TABS tablet Take 1 tablet by mouth in the morning.    [provider]  ondansetron (ZOFRAN) 4 MG tablet TAKE 1 TABLET(4 MG) BY MOUTH EVERY 8 HOURS AS NEEDED FOR NAUSEA OR VOMITING 04/20/22   Burns, Bobette Mo, MD   oxyCODONE (ROXICODONE) 5 MG immediate release tablet Take 1 tablet (5 mg total) by mouth every 8 (eight) hours as needed for severe pain (pain score 7-10) (chronic back pain). MDD 3 01/05/23   Pincus Sanes, MD  pantoprazole (PROTONIX) 40 MG tablet Take 1 tablet (40 mg total) by mouth daily. Please follow up with primary care provider for additional refills. 09/02/22   Swinyer, Zachary George, NP  predniSONE (DELTASONE) 5 MG tablet TAKE 1 TABLET BY MOUTH EVERY DAY WITH BREAKFAST 06/01/22   Carlus Pavlov, MD  simvastatin (ZOCOR) 20 MG tablet Take 1 tablet (20 mg total) by mouth at bedtime. 11/06/22   [name], MD  sucralfate (CARAFATE) 1 GM/10ML suspension Take 1 g by mouth in the morning.    [provider]  tamsulosin (FLOMAX) 0.4 MG CAPS capsule TAKE 1 CAPSULE BY MOUTH EVERY DAY AFTER SUPPER Patient taking differently: Take 0.4 mg by mouth in the morning. 04/20/22   Pincus Sanes, MD    Physical Exam: Vitals:   01/19/23 1415 01/19/23 1430 01/19/23 1515 01/19/23 1530  BP: 121/68 133/70 133/70 132/75  Pulse: 95 87 88 87  Resp: 18 19 17  (!) 21  Temp:      TempSrc:      SpO2: 94% 100% 97% 97%  Weight:      Height:       *** Data Reviewed: {Tip this will not be part of the note when signed- Document your independent interpretation of telemetry tracing, EKG, lab, Radiology test or any other diagnostic tests. Add any new diagnostic test ordered today. (Optional):[id number]} Reviewed labs, imaging, and pertinent records as documented.  Assessment and Plan:  Sepsis secondary to possible community-acquired pneumonia Patient presents with complaints of generalized weakness.  He was noted to be tachypneic with white blood cell count elevated up to 14.5 and initial lactic acid 2.2.  Chest x-ray noted bibasilar atelectasis noted to be worse on the left than on the right.  Urinalysis showed no signs for infection.  Patient had initially been started on empiric antibiotics of  Rocephin and vancomycin. -Admit to a telemetry bed -Follow-up blood cultures -Check procalcitonin -Check urine Legionella, urine strep, and MRSA screen -Continue antibiotics of vancomycin, Rocephin, and azithromycin  Global weakness Patient presents with reports of globalized weakness for which she was unable to get out of bed.  Normally able to ambulate without assistance.  Records note he just recently had an MRI of the brain which noted stable brain masses on 12/13.  Denies any headache and on physical exam no focal weakness appreciated. -Follow-up thyroid studies -PT/OT to  eval and treat in a.m.  Acute metabolic encephalopathy Daughter makes note that he had been intermittently more confused for which he had been seen by his neurologist at the recent MRI. -Neurochecks  Elevated troponin   History of metastatic melanoma Patient with known metastatic lesions to the brain prior history of immunotherapy and radiation treatments.   -Continue outpatient follow-up with oncology  Renal insufficiency Creatinine 1.34 with BUN 17.  Baseline creatinine previously noted to be 0.9-1.1.  Patient has been given 500 mL bolus of IV fluids. -   Heart failure with reduced EF Last echocardiogram noted EF to be 40 to 45% with grade 1 diastolic dysfunction when checked on 08/2022.  Hypothyroidism -Check thyroid studies on -Continue levothyroxine   BPH Patient has known history of BPH, but over the last couple of days had reported being unable to make it to the bathroom in time prior to urinating on himself. -Check postvoid residuals -Continue Proscar and Flomax  GERD -Continue Protonix    Advance Care Planning:   Code Status: Prior ***  Consults: ***  Family Communication: ***  Severity of Illness: {Observation/Inpatient:21159}  Author: Clydie Braun, MD 01/19/2023 4:31 PM  For on call review [email or web address].

## 2023-01-19 NOTE — ED Notes (Signed)
 CCMD called.

## 2023-01-19 NOTE — Telephone Encounter (Signed)
Patient stated he is concerned that his head "gets foggy" and when he speaks he is making no sense.  Patient stated his BP has been normal and he has had 4 episodes like this in the last five months.  Patient stated his wife has to assist him standing and sitting.

## 2023-01-20 ENCOUNTER — Inpatient Hospital Stay (HOSPITAL_COMMUNITY): Payer: Medicare Other

## 2023-01-20 DIAGNOSIS — Z7952 Long term (current) use of systemic steroids: Secondary | ICD-10-CM

## 2023-01-20 DIAGNOSIS — I5022 Chronic systolic (congestive) heart failure: Secondary | ICD-10-CM | POA: Diagnosis not present

## 2023-01-20 DIAGNOSIS — J189 Pneumonia, unspecified organism: Secondary | ICD-10-CM | POA: Diagnosis not present

## 2023-01-20 DIAGNOSIS — G9341 Metabolic encephalopathy: Secondary | ICD-10-CM | POA: Diagnosis not present

## 2023-01-20 DIAGNOSIS — R4182 Altered mental status, unspecified: Secondary | ICD-10-CM

## 2023-01-20 DIAGNOSIS — R531 Weakness: Secondary | ICD-10-CM

## 2023-01-20 DIAGNOSIS — N179 Acute kidney failure, unspecified: Secondary | ICD-10-CM

## 2023-01-20 DIAGNOSIS — R7989 Other specified abnormal findings of blood chemistry: Secondary | ICD-10-CM | POA: Diagnosis present

## 2023-01-20 LAB — CBC
HCT: 35.7 % — ABNORMAL LOW (ref 39.0–52.0)
Hemoglobin: 11.8 g/dL — ABNORMAL LOW (ref 13.0–17.0)
MCH: 28.5 pg (ref 26.0–34.0)
MCHC: 33.1 g/dL (ref 30.0–36.0)
MCV: 86.2 fL (ref 80.0–100.0)
Platelets: 179 10*3/uL (ref 150–400)
RBC: 4.14 MIL/uL — ABNORMAL LOW (ref 4.22–5.81)
RDW: 15.5 % (ref 11.5–15.5)
WBC: 13.6 10*3/uL — ABNORMAL HIGH (ref 4.0–10.5)
nRBC: 0 % (ref 0.0–0.2)

## 2023-01-20 LAB — RESPIRATORY PANEL BY PCR

## 2023-01-20 LAB — BASIC METABOLIC PANEL
Anion gap: 12 (ref 5–15)
BUN: 18 mg/dL (ref 8–23)
CO2: 21 mmol/L — ABNORMAL LOW (ref 22–32)
Calcium: 8.2 mg/dL — ABNORMAL LOW (ref 8.9–10.3)
Chloride: 104 mmol/L (ref 98–111)
Creatinine, Ser: 1.12 mg/dL (ref 0.61–1.24)
GFR, Estimated: >60 mL/min (ref >60)
Glucose, Bld: 90 mg/dL (ref 70–99)
Potassium: 3.7 mmol/L (ref 3.5–5.1)
Sodium: 137 mmol/L (ref 135–145)

## 2023-01-20 LAB — C DIFFICILE QUICK SCREEN W PCR REFLEX
C Diff antigen: NEGATIVE
C Diff interpretation: NOT DETECTED
C Diff toxin: NEGATIVE

## 2023-01-20 LAB — STREP PNEUMONIAE URINARY ANTIGEN: Strep Pneumo Urinary Antigen: NEGATIVE

## 2023-01-20 MED ORDER — PREDNISONE 10 MG PO TABS
5.0000 mg | ORAL_TABLET | Freq: Every day | ORAL | Status: DC
  Administered 2023-01-21: 5 mg via ORAL
  Filled 2023-01-20: qty 1

## 2023-01-20 MED ORDER — ONDANSETRON HCL 4 MG/2ML IJ SOLN
4.0000 mg | Freq: Four times a day (QID) | INTRAMUSCULAR | Status: DC | PRN
  Administered 2023-01-20: 4 mg via INTRAVENOUS
  Filled 2023-01-20: qty 2

## 2023-01-20 MED ORDER — FUROSEMIDE 10 MG/ML IJ SOLN
40.0000 mg | Freq: Once | INTRAMUSCULAR | Status: AC
  Administered 2023-01-20: 40 mg via INTRAVENOUS
  Filled 2023-01-20: qty 4

## 2023-01-20 MED ORDER — AZITHROMYCIN 500 MG PO TABS
500.0000 mg | ORAL_TABLET | Freq: Every day | ORAL | Status: DC
  Administered 2023-01-20 – 2023-01-21 (×2): 500 mg via ORAL
  Filled 2023-01-20 (×2): qty 1

## 2023-01-20 NOTE — Evaluation (Signed)
Occupational Therapy Evaluation Patient Details Name: Maxwell Fox MRN: [id number] DOB: Jun 13, 1945 Today's Date: 01/20/2023   History of Present Illness 77 y.o. male presents to [facility] hospital on 01/19/2023 with sudden onset generalized weakness and AMS. Pt admitted with concern for PNA. PMH includes HTN, melanoma with brain mets, hypothyroidism, depression, BPH, GIB, GERD.   Clinical Impression   Pt in good spirits, feeling better. Pt lives with wife, PLOF independent without AD, lives on first floor, 2 steps to enter home. Pt currently close to baseline, uses RW for support, able to ambulate around room with supervision. Pt set up for dressing, able to complete standing ADLs unsupported as needed, no LOB. Pt states he has all DME at home needed to remain safe/independent, wife is able to assist 24/7 as needed, no acute OT or follow up needed.       If plan is discharge home, recommend the following: A little help with walking and/or transfers;A little help with bathing/dressing/bathroom;Assistance with cooking/housework;Assist for transportation;Help with stairs or ramp for entrance    Functional Status Assessment  Patient has had a recent decline in their functional status and demonstrates the ability to make significant improvements in function in a reasonable and predictable amount of time.  Equipment Recommendations  None recommended by OT    Recommendations for Other Services       Precautions / Restrictions Precautions Precautions: Fall Restrictions Weight Bearing Restrictions Per Provider Order: No      Mobility Bed Mobility Overal bed mobility: Modified Independent                  Transfers Overall transfer level: Needs assistance Equipment used: Rolling walker (2 wheels), None Transfers: Sit to/from Stand Sit to Stand: Supervision           General transfer comment: no LOB, good safety awareness      Balance Overall balance assessment: Needs  assistance Sitting-balance support: No upper extremity supported, Feet supported Sitting balance-Leahy Scale: Good     Standing balance support: During functional activity Standing balance-Leahy Scale: Fair Standing balance comment: Pt able to stand at sink unsupported performing ADLs as needed, no LOB                           ADL either performed or assessed with clinical judgement   ADL                                               Vision         Perception         Praxis         Pertinent Vitals/Pain Pain Assessment Pain Assessment: No/denies pain     Extremity/Trunk Assessment Upper Extremity Assessment Upper Extremity Assessment: Overall WFL for tasks assessed   Lower Extremity Assessment Lower Extremity Assessment: Defer to PT evaluation   Cervical / Trunk Assessment Cervical / Trunk Assessment: Normal   Communication Communication Communication: No apparent difficulties Cueing Techniques: Verbal cues   Cognition Arousal: Alert Behavior During Therapy: WFL for tasks assessed/performed Overall Cognitive Status: Within Functional Limits for tasks assessed                                       General  Comments  VSS on RA    Exercises     Shoulder Instructions      Home Living Family/patient expects to be discharged to:: Private residence Living Arrangements: Spouse/significant other Available Help at Discharge: Family;Available 24 hours/day Type of Home: House Home Access: Stairs to enter [employer] of Steps: 2 Entrance Stairs-Rails: Can reach both Home Layout: Two level;Able to live on main level with bedroom/bathroom     Bathroom Shower/Tub: Producer, television/film/video: Standard     Home Equipment: Shower seat;Wheelchair - Forensic psychologist (2 wheels);Grab bars - toilet;Grab bars - tub/shower   Additional Comments: Pt lives with wife who is available 24/7, able to help  as needed, daughter also assists      Prior Functioning/Environment Prior Level of Function : Independent/Modified Independent;Driving             Mobility Comments: ambulatory without DME, driving          OT Problem List: Decreased strength;Decreased activity tolerance;Impaired balance (sitting and/or standing)      OT Treatment/Interventions:      OT Goals(Current goals can be found in the care plan section) Acute Rehab OT Goals Patient Stated Goal: to return home OT Goal Formulation: With patient Time For Goal Achievement: 02/03/23 Potential to Achieve Goals: Good  OT Frequency:      Co-evaluation              AM-PAC OT "6 Clicks" Daily Activity     Outcome Measure Help from another person eating meals?: None Help from another person taking care of personal grooming?: A Little Help from another person toileting, which includes using toliet, bedpan, or urinal?: A Little Help from another person bathing (including washing, rinsing, drying)?: A Little Help from another person to put on and taking off regular upper body clothing?: A Little Help from another person to put on and taking off regular lower body clothing?: A Little 6 Click Score: 19   End of Session Equipment Utilized During Treatment: Gait belt;Rolling walker (2 wheels) Nurse Communication: Mobility status  Activity Tolerance: Patient tolerated treatment well Patient left: in chair;with call bell/phone within reach;with chair alarm set  OT Visit Diagnosis: Unsteadiness on feet (R26.81);Muscle weakness (generalized) (M62.81)                Time: 5188-4166 OT Time Calculation (min): 14 min Charges:  OT General Charges $OT Visit: 1 Visit OT Evaluation $OT Eval Low Complexity: 1 Low  [address] OTR/L   Alexis Goodell 01/20/2023, 11:29 AM

## 2023-01-20 NOTE — Assessment & Plan Note (Signed)
01-20-2023 likely due to pneumonia.  01-21-2023 improved

## 2023-01-20 NOTE — Assessment & Plan Note (Addendum)
01-20-2023 follows with endocrinology. Pt has hx of central adrenal insufficiency,  central hypothyroidism. Change back to po prednisone. Stop IV hydrocortisone. 01-21-2023 stable. Continue home prednisone 5 mg daily.

## 2023-01-20 NOTE — Hospital Course (Signed)
HPI: Maxwell Fox is a 77 y.o. male with medical history significant of hypertension, melanoma with brain  metastasis, hypothyroidism, depression, iron deficiency anemia, BPH, history of GI bleed, s/p gastric bypass, and GERD who presents with sudden onset of generalized weakness. He reports being unable to get up from his bed, requiring assistance from his wife to move to a chair.  Normally able to ambulate without need of assistance he denies any recent cough, shortness of breath, stomach pain, nausea, vomiting, or diarrhea. He reports difficulty in lifting his leg and weakness in his arms. He also mentions episodes of mental confusion, during which he was disoriented and had difficulty responding to questions. He has been experiencing these episodes of confusion intermittently. He also reports recent issues with urinary incontinence, stating that he has had difficulty reaching the toilet in time during the night.   Patient just recently had MRI of the brain at [facility] on 12/13 due to reports of increasing confusion.  Which noted stable appearance of multiple treated lesions, stable left temporal lobe enhancing lesion, and no evidence of new intracranial metastases.    In the emergency department patient was noted to be afebrile with respirations elevated up to 30, and all other vital signs relatively maintained.  Labs significant for WBC 14.5.  Chest x-ray significant for mild bibasilar segmental atelectasis or infiltrates noted to be greater on the left than on the right.  Urinalysis showed no signs for infection.  Patient had been given 500 mL bolus of fluids, vancomycin, and Rocephin.    Significant Events: Admitted 01/19/2023 for sepsis, CAP, weakness   Significant Labs: Admission WBC 14.5, BUN 17, Scr 1.34, procal 0.55, lactic acid 2.2  Significant Imaging Studies: Cxr shows Mild bibasilar subsegmental atelectasis or infiltrates L > R.  Antibiotic Therapy: Anti-infectives (From admission,  onward)    Start     Dose/Rate Route Frequency Ordered Stop   01/20/23 1700  Vancomycin (VANCOCIN) 1,500 mg in sodium chloride 0.9 % 500 mL IVPB        1,500 mg 250 mL/hr over 120 Minutes Intravenous Every 24 hours 01/19/23 1845     01/20/23 1200  cefTRIAXone (ROCEPHIN) 2 g in sodium chloride 0.9 % 100 mL IVPB        2 g 200 mL/hr over 30 Minutes Intravenous Every 24 hours 01/19/23 1649 01/24/23 1159   01/19/23 1900  vancomycin (VANCOCIN) IVPB 1000 mg/200 mL premix        1,000 mg 200 mL/hr over 60 Minutes Intravenous  Once 01/19/23 1732 01/19/23 1951   01/19/23 1800  azithromycin (ZITHROMAX) 500 mg in sodium chloride 0.9 % 250 mL IVPB        500 mg 250 mL/hr over 60 Minutes Intravenous Every 24 hours 01/19/23 1649 01/24/23 1759   01/19/23 1630  vancomycin (VANCOCIN) IVPB 1000 mg/200 mL premix        1,000 mg 200 mL/hr over 60 Minutes Intravenous  Once 01/19/23 1621 01/19/23 1800   01/19/23 1630  cefTRIAXone (ROCEPHIN) 2 g in sodium chloride 0.9 % 100 mL IVPB        2 g 200 mL/hr over 30 Minutes Intravenous  Once 01/19/23 1621 01/19/23 1733       Procedures:   Consultants:

## 2023-01-20 NOTE — Assessment & Plan Note (Signed)
01-20-2023 followed by [facility]. 01-21-2023 stable

## 2023-01-20 NOTE — Assessment & Plan Note (Signed)
01-20-2023 resolved with IVF.

## 2023-01-20 NOTE — Progress Notes (Signed)
Patient was alert and orient 4 with me today. He asked for pain medication today because of a taper program he is on with a Dr. Lawerance Bach. I told him the oxycodone is only every 8 hours. He also asked about a bladder test that the ED doctor told him he would order. I told him I did not see anything regarding a bladder test. He has had good urine output today. He only had 1 bowel movement today. He is stable and I will continue to monitor.

## 2023-01-20 NOTE — Assessment & Plan Note (Signed)
01-20-2023 continue synthroid 88 mcg daily. 01-21-2023 stable

## 2023-01-20 NOTE — Subjective & Objective (Signed)
Pt seen and examined. No further diarrhea WBC 11.6 Strep pneumo negative Awaiting procal Pt feeling better. Remains on RA No fevers. Wants to leave this AM.

## 2023-01-20 NOTE — Assessment & Plan Note (Signed)
01-20-2023 continue proscar, flomax. 01-21-2023 stable

## 2023-01-20 NOTE — Assessment & Plan Note (Signed)
01-20-2023 improved and resolved after treatment for pneumonia. EEG negative for seizures. Recent MRI brain 01-15-2023 shows stable melanoma in his brain. Pt states he had gamma knife and prior craniotomy.  01-21-2023 resolved

## 2023-01-20 NOTE — Assessment & Plan Note (Signed)
01-20-2023 troponin mildly elevated by flat. Not NSTEMI.

## 2023-01-20 NOTE — Assessment & Plan Note (Signed)
01-20-2023 LLL pneumonia on CXR. continue with rocephin/zithromax. Pending strep pneumo, legionella antigens. 01-21-2023 strep pneumo negative. Legionella pending. Remains on RA. WBC 11.6. will treat for a total of 7 day with abx. DC to home with duricef for 5 days and 4 more days of zithromax. Pneumonia Antigens Lab Results  Component Value Date/Time   [facility] NEGATIVE 01/19/2023 12:21 PM

## 2023-01-20 NOTE — Procedures (Signed)
Patient Name: [name]  MRN: [id number]  Epilepsy Attending: Charlsie Quest  Referring Physician/Provider: Clydie Braun, MD  Date: 01/20/2023 Duration: 24.04 mins  Patient history: 77yo M with ams getting eeg to evaluate for seizure  Level of alertness: Awake  AEDs during EEG study: None  Technical aspects: This EEG study was done with scalp electrodes positioned according to the 10-20 International system of electrode placement. Electrical activity was reviewed with band pass filter of 1-70Hz , sensitivity of 7 uV/mm, display speed of 70mm/sec with a 60Hz  notched filter applied as appropriate. EEG data were recorded continuously and digitally stored.  Video monitoring was available and reviewed as appropriate.  Description: The posterior dominant rhythm consists of 7.5 Hz activity of moderate voltage (25-35 uV) seen predominantly in posterior head regions, symmetric and reactive to eye opening and eye closing. EEG showed continuous 3 to 6 Hz theta-delta slowing with overriding 12-15hz  beta activity in right fronto-temporal region. Hyperventilation did not show any EEG change consistent with breach artifact. Physiologic photic driving was seen during photic stimulation.    ABNORMALITY - Breach artifact, right frontotemporal region  IMPRESSION: This study is suggestive of cortical dysfunction arising from right frontotemporal region consistent with underlying craniotomy. No seizures or epileptiform discharges were seen throughout the recording.  Maxwell Fox Maxwell Fox

## 2023-01-20 NOTE — Evaluation (Signed)
Physical Therapy Evaluation Patient Details Name: Maxwell Fox MRN: [id number] DOB: 06/28/1945 Today's Date: 01/20/2023  History of Present Illness  77 y.o. male presents to [facility] hospital on 01/19/2023 with sudden onset generalized weakness and AMS. Pt admitted with concern for PNA. PMH includes HTN, melanoma with brain mets, hypothyroidism, depression, BPH, GIB, GERD.  Clinical Impression  Pt presents to PT with deficits in functional mobility, gait, balance, endurance. Pt is able to transfer and ambulate without physical assistance, electing to utilize RW for stability. Pt will benefit from frequent mobilization in an effort to improve activity tolerance and to restore prior level of function. PT anticipates no post-acute PT or DME needs.        If plan is discharge home, recommend the following: A little help with bathing/dressing/bathroom;Assistance with cooking/housework;Assist for transportation;Help with stairs or ramp for entrance   Can travel by private vehicle        Equipment Recommendations None recommended by PT  Recommendations for Other Services       Functional Status Assessment Patient has had a recent decline in their functional status and demonstrates the ability to make significant improvements in function in a reasonable and predictable amount of time.     Precautions / Restrictions Precautions Precautions: Fall Restrictions Weight Bearing Restrictions Per Provider Order: No      Mobility  Bed Mobility Overal bed mobility: Modified Independent                  Transfers Overall transfer level: Needs assistance Equipment used: Rolling walker (2 wheels), None Transfers: Sit to/from Stand Sit to Stand: Supervision                Ambulation/Gait Ambulation/Gait assistance: Supervision Gait Distance (Feet): 25 Feet (25' x 2) Assistive device: Rolling walker (2 wheels) Gait Pattern/deviations: Step-through pattern Gait velocity:  reduced Gait velocity interpretation: 1.31 - 2.62 ft/sec, indicative of limited community ambulator   General Gait Details: slowed step-through gait, no significant balance deviations noted  Stairs            Wheelchair Mobility     Tilt Bed    Modified Rankin (Stroke Patients Only)       Balance Overall balance assessment: Needs assistance Sitting-balance support: No upper extremity supported, Feet supported Sitting balance-Leahy Scale: Good     Standing balance support: Single extremity supported, Reliant on assistive device for balance Standing balance-Leahy Scale: Poor                               Pertinent Vitals/Pain Pain Assessment Pain Assessment: No/denies pain    Home Living Family/patient expects to be discharged to:: Private residence Living Arrangements: Spouse/significant other Available Help at Discharge: Family;Available 24 hours/day Type of Home: House Home Access: Stairs to enter Entrance Stairs-Rails: Can reach both Entrance Stairs-Number of Steps: 2   Home Layout: Able to live on main level with bedroom/bathroom Home Equipment: Agricultural consultant (2 wheels);Wheelchair - Manufacturing systems engineer;Shower seat - built in;Grab bars - toilet;Grab bars - tub/shower Additional Comments: Pt lives with wife who is available 24/7, able to help as needed, daughter also assists    Prior Function Prior Level of Function : Independent/Modified Independent;Driving             Mobility Comments: ambulatory without DME, driving       Extremity/Trunk Assessment   Upper Extremity Assessment Upper Extremity Assessment: Overall WFL for tasks assessed  Lower Extremity Assessment Lower Extremity Assessment: Overall WFL for tasks assessed    Cervical / Trunk Assessment Cervical / Trunk Assessment: Normal  Communication   Communication Communication: No apparent difficulties Cueing Techniques: Verbal cues  Cognition Arousal: Alert Behavior  During Therapy: WFL for tasks assessed/performed Overall Cognitive Status: Within Functional Limits for tasks assessed                                          General Comments General comments (skin integrity, edema, etc.): VSS on RA    Exercises     Assessment/Plan    PT Assessment Patient needs continued PT services  PT Problem List Decreased activity tolerance;Decreased balance;Decreased mobility       PT Treatment Interventions DME instruction;Gait training;Stair training;Functional mobility training;Therapeutic activities;Therapeutic exercise;Balance training;Neuromuscular re-education;Patient/family education    PT Goals (Current goals can be found in the Care Plan section)  Acute Rehab PT Goals Patient Stated Goal: to return to independence PT Goal Formulation: With patient Time For Goal Achievement: 02/03/23 Potential to Achieve Goals: Good    Frequency Min 1X/week     Co-evaluation               AM-PAC PT "6 Clicks" Mobility  Outcome Measure Help needed turning from your back to your side while in a flat bed without using bedrails?: None Help needed moving from lying on your back to sitting on the side of a flat bed without using bedrails?: None Help needed moving to and from a bed to a chair (including a wheelchair)?: A Little Help needed standing up from a chair using your arms (e.g., wheelchair or bedside chair)?: A Little Help needed to walk in hospital room?: A Little Help needed climbing 3-5 steps with a railing? : A Little 6 Click Score: 20    End of Session   Activity Tolerance: Patient tolerated treatment well Patient left: in chair;with call bell/phone within reach;with chair alarm set Nurse Communication: Mobility status PT Visit Diagnosis: Other abnormalities of gait and mobility (R26.89)    Time: 4098-1191 PT Time Calculation (min) (ACUTE ONLY): 28 min   Charges:   PT Evaluation $PT Eval Low Complexity: 1 Low   PT  General Charges $$ ACUTE PT VISIT: 1 Visit         Arlyss Gandy, PT, DPT Acute Rehabilitation Office [phone]   Arlyss Gandy 01/20/2023, 11:15 AM

## 2023-01-20 NOTE — Assessment & Plan Note (Signed)
01-20-2023 stable. Did receive 1 dose of IV lasix in ER. No CHF on cxr. 01-21-2023 pt did not have CHF exacerbation. Baseline BNP 335.

## 2023-01-20 NOTE — Progress Notes (Signed)
PROGRESS NOTE    Maxwell Fox  UEA:[id number] DOB: 1945/10/20 DOA: 01/19/2023 PCP: Pincus Sanes, MD  Subjective: Pt seen and examined. Met with pt's wife at bedside. Pt has had diarrhea since starting abx.  Informed pt that he has LLL pneumonia.  On RA. Procal mildly elevated at 0.55    Hospital Course: HPI: Maxwell Fox is a 77 y.o. male with medical history significant of hypertension, melanoma with brain  metastasis, hypothyroidism, depression, iron deficiency anemia, BPH, history of GI bleed, s/p gastric bypass, and GERD who presents with sudden onset of generalized weakness. He reports being unable to get up from his bed, requiring assistance from his wife to move to a chair.  Normally able to ambulate without need of assistance he denies any recent cough, shortness of breath, stomach pain, nausea, vomiting, or diarrhea. He reports difficulty in lifting his leg and weakness in his arms. He also mentions episodes of mental confusion, during which he was disoriented and had difficulty responding to questions. He has been experiencing these episodes of confusion intermittently. He also reports recent issues with urinary incontinence, stating that he has had difficulty reaching the toilet in time during the night.   Patient just recently had MRI of the brain at [facility] on 12/13 due to reports of increasing confusion.  Which noted stable appearance of multiple treated lesions, stable left temporal lobe enhancing lesion, and no evidence of new intracranial metastases.    In the emergency department patient was noted to be afebrile with respirations elevated up to 30, and all other vital signs relatively maintained.  Labs significant for WBC 14.5.  Chest x-ray significant for mild bibasilar segmental atelectasis or infiltrates noted to be greater on the left than on the right.  Urinalysis showed no signs for infection.  Patient had been given 500 mL bolus of fluids, vancomycin, and  Rocephin.    Significant Events: Admitted 01/19/2023 for sepsis, CAP, weakness   Significant Labs: Admission WBC 14.5, BUN 17, Scr 1.34, procal 0.55, lactic acid 2.2  Significant Imaging Studies: Cxr shows Mild bibasilar subsegmental atelectasis or infiltrates L > R.  Antibiotic Therapy: Anti-infectives (From admission, onward)    Start     Dose/Rate Route Frequency Ordered Stop   01/20/23 1700  Vancomycin (VANCOCIN) 1,500 mg in sodium chloride 0.9 % 500 mL IVPB        1,500 mg 250 mL/hr over 120 Minutes Intravenous Every 24 hours 01/19/23 1845     01/20/23 1200  cefTRIAXone (ROCEPHIN) 2 g in sodium chloride 0.9 % 100 mL IVPB        2 g 200 mL/hr over 30 Minutes Intravenous Every 24 hours 01/19/23 1649 01/24/23 1159   01/19/23 1900  vancomycin (VANCOCIN) IVPB 1000 mg/200 mL premix        1,000 mg 200 mL/hr over 60 Minutes Intravenous  Once 01/19/23 1732 01/19/23 1951   01/19/23 1800  azithromycin (ZITHROMAX) 500 mg in sodium chloride 0.9 % 250 mL IVPB        500 mg 250 mL/hr over 60 Minutes Intravenous Every 24 hours 01/19/23 1649 01/24/23 1759   01/19/23 1630  vancomycin (VANCOCIN) IVPB 1000 mg/200 mL premix        1,000 mg 200 mL/hr over 60 Minutes Intravenous  Once 01/19/23 1621 01/19/23 1800   01/19/23 1630  cefTRIAXone (ROCEPHIN) 2 g in sodium chloride 0.9 % 100 mL IVPB        2 g 200 mL/hr over 30 Minutes Intravenous  Once  01/19/23 1621 01/19/23 1733       Procedures:   Consultants:     Assessment and Plan: * Sepsis due to pneumonia (HCC) 01-20-2023 supported by LLL pneumonia on CXR. Lactic acid of 2.2, WBC 14.5.  continue with rocephin/zithromax. Pending strep pneumo, legionella antigens.  LLL pneumonia 01-20-2023 LLL pneumonia on CXR. continue with rocephin/zithromax. Pending strep pneumo, legionella antigens.  Acute metabolic encephalopathy 01-20-2023 improved and resolved after treatment for pneumonia. EEG negative for seizures. Recent MRI brain  01-15-2023 shows stable melanoma in his brain. Pt states he had gamma knife and prior craniotomy.  Current chronic use of systemic steroids 01-20-2023 follows with endocrinology. Pt has hx of central adrenal insufficiency,  central hypothyroidism. Change back to po prednisone. Stop IV hydrocortisone.  Chronic systolic CHF (congestive heart failure) (HCC) 01-20-2023 stable. Did receive 1 dose of IV lasix in ER. No CHF on cxr.  AKI (acute kidney injury) (HCC) 01-20-2023 resolved with IVF.  Elevated troponin 01-20-2023 troponin mildly elevated by flat. Not NSTEMI.  Weakness 01-20-2023 likely due to pneumonia.   Hypothyroid 01-20-2023 continue synthroid 88 mcg daily.  Metastatic malignant melanoma (HCC) 01-20-2023 followed by [facility].  BPH (benign prostatic hyperplasia) 01-20-2023 continue proscar, flomax.       DVT prophylaxis: enoxaparin (LOVENOX) injection 40 mg Start: 01/19/23 2200     Code Status: Full Code Family Communication: discussed with pt and his wife at bedside Disposition Plan: return home Reason for continuing need for hospitalization: continue IV abx.  Objective: Vitals:   01/20/23 0024 01/20/23 0339 01/20/23 0617 01/20/23 0835  BP: 110/64 104/68  129/72  Pulse: 79 71  81  Resp: 18 18  18   Temp: 99.5 F (37.5 C) 98.1 F (36.7 C)    TempSrc: Oral Oral    SpO2: 93% 94%  96%  Weight:   87.8 kg   Height:        Intake/Output Summary (Last 24 hours) at 01/20/2023 1547 Last data filed at 01/20/2023 1419 Gross per 24 hour  Intake 1590 ml  Output 1800 ml  Net -210 ml   Filed Weights   01/19/23 1116 01/19/23 1800 01/20/23 0617  Weight: 83.9 kg 87.1 kg 87.8 kg    Examination:  Physical Exam Vitals reviewed.  Constitutional:      General: He is not in acute distress.    Appearance: He is not toxic-appearing.  HENT:     Head: Normocephalic and atraumatic.     Nose: Nose normal.  Cardiovascular:     Rate and Rhythm: Normal rate and regular  rhythm.  Pulmonary:     Effort: Pulmonary effort is normal. No respiratory distress.     Breath sounds: Normal air entry. Examination of the left-lower field reveals rales. Rales present.  Abdominal:     General: Bowel sounds are normal.     Palpations: Abdomen is soft.  Skin:    General: Skin is warm and dry.     Capillary Refill: Capillary refill takes less than 2 seconds.  Neurological:     Mental Status: He is alert and oriented to person, place, and time.     Data Reviewed: I have personally reviewed following labs and imaging studies  CBC: Recent Labs  Lab 01/19/23 1116 01/20/23 0647  WBC 14.5* 13.6*  NEUTROABS 11.3*  --   HGB 13.4 11.8*  HCT 41.9 35.7*  MCV 88.0 86.2  PLT 203 179   Basic Metabolic Panel: Recent Labs  Lab 01/19/23 1116 01/20/23 0647  NA 135 137  K 4.7 3.7  CL 103 104  CO2 23 21*  GLUCOSE 83 90  BUN 17 18  CREATININE 1.34* 1.12  CALCIUM 8.9 8.2*   GFR: Estimated Creatinine Clearance: 58.8 mL/min (by C-G formula based on SCr of 1.12 mg/dL). Liver Function Tests: Recent Labs  Lab 01/19/23 1116  AST 32  ALT 33  ALKPHOS 110  BILITOT 1.2*  PROT 5.8*  ALBUMIN 2.9*   Coagulation Profile: Recent Labs  Lab 01/19/23 1221  INR 1.2   Cardiac Enzymes: Recent Labs  Lab 01/19/23 1221  CKTOTAL 54   BNP (last 3 results) Recent Labs    01/19/23 1116  BNP 400.4*   Thyroid Function Tests: Recent Labs    01/19/23 1650  TSH 1.157  FREET4 1.30*   Sepsis Labs: Recent Labs  Lab 01/19/23 1410 01/19/23 1658  PROCALCITON 0.55  --   LATICACIDVEN  --  2.2*    Recent Results (from the past 240 hours)  Resp panel by RT-PCR (RSV, Flu A&B, Covid) Anterior Nasal Swab     Status: None   Collection Time: 01/19/23 12:21 PM   Specimen: Anterior Nasal Swab  Result Value Ref Range Status   SARS Coronavirus 2 by RT PCR NEGATIVE NEGATIVE Final   Influenza A by PCR NEGATIVE NEGATIVE Final   Influenza B by PCR NEGATIVE NEGATIVE Final     Comment: (NOTE) The Xpert Xpress SARS-CoV-2/FLU/RSV plus assay is intended as an aid in the diagnosis of influenza from Nasopharyngeal swab specimens and should not be used as a sole basis for treatment. Nasal washings and aspirates are unacceptable for Xpert Xpress SARS-CoV-2/FLU/RSV testing.  Fact Sheet for Patients: BloggerCourse.com  Fact Sheet for Healthcare Providers: SeriousBroker.it  This test is not yet approved or cleared by the [place] FDA and has been authorized for detection and/or diagnosis of SARS-CoV-2 by FDA under an Emergency Use Authorization (EUA). This EUA will remain in effect (meaning this test can be used) for the duration of the COVID-19 declaration under Section 564(b)(1) of the Act, 21 U.S.C. section 360bbb-3(b)(1), unless the authorization is terminated or revoked.     Resp Syncytial Virus by PCR NEGATIVE NEGATIVE Final    Comment: (NOTE) Fact Sheet for Patients: BloggerCourse.com  Fact Sheet for Healthcare Providers: SeriousBroker.it  This test is not yet approved or cleared by the [place] FDA and has been authorized for detection and/or diagnosis of SARS-CoV-2 by FDA under an Emergency Use Authorization (EUA). This EUA will remain in effect (meaning this test can be used) for the duration of the COVID-19 declaration under Section 564(b)(1) of the Act, 21 U.S.C. section 360bbb-3(b)(1), unless the authorization is terminated or revoked.  Performed at [facility], [address], [place], [place] [address]   Blood Culture (routine x 2)     Status: None (Preliminary result)   Collection Time: 01/19/23  4:49 PM   Specimen: BLOOD RIGHT FOREARM  Result Value Ref Range Status   Specimen Description BLOOD RIGHT FOREARM  Final   Special Requests   Final    BOTTLES DRAWN AEROBIC AND ANAEROBIC Blood Culture adequate volume   Culture    Final    NO GROWTH < 24 HOURS Performed at [facility], [address] [place], [place] [address]    Report Status PENDING  Incomplete  Blood Culture (routine x 2)     Status: None (Preliminary result)   Collection Time: 01/19/23  4:50 PM   Specimen: BLOOD RIGHT ARM  Result  Value Ref Range Status   Specimen Description BLOOD RIGHT ARM  Final   Special Requests   Final    BOTTLES DRAWN AEROBIC AND ANAEROBIC Blood Culture adequate volume   Culture   Final    NO GROWTH < 24 HOURS Performed at [facility], [address] [place], [place] [address]    Report Status PENDING  Incomplete  Respiratory (~20 pathogens) panel by PCR     Status: None   Collection Time: 01/19/23  5:24 PM   Specimen: Nasopharyngeal Swab; Respiratory  Result Value Ref Range Status   Adenovirus NOT DETECTED NOT DETECTED Final   Coronavirus 229E NOT DETECTED NOT DETECTED Final    Comment: (NOTE) The Coronavirus on the Respiratory Panel, DOES NOT test for the novel  Coronavirus (2019 nCoV)    Coronavirus HKU1 NOT DETECTED NOT DETECTED Final   Coronavirus NL63 NOT DETECTED NOT DETECTED Final   Coronavirus OC43 NOT DETECTED NOT DETECTED Final   Metapneumovirus NOT DETECTED NOT DETECTED Final   Rhinovirus / Enterovirus NOT DETECTED NOT DETECTED Final   Influenza A NOT DETECTED NOT DETECTED Final   Influenza B NOT DETECTED NOT DETECTED Final   Parainfluenza Virus 1 NOT DETECTED NOT DETECTED Final   Parainfluenza Virus 2 NOT DETECTED NOT DETECTED Final   Parainfluenza Virus 3 NOT DETECTED NOT DETECTED Final   Parainfluenza Virus 4 NOT DETECTED NOT DETECTED Final   Respiratory Syncytial Virus NOT DETECTED NOT DETECTED Final   Bordetella pertussis NOT DETECTED NOT DETECTED Final   Bordetella Parapertussis NOT DETECTED NOT DETECTED Final   Chlamydophila pneumoniae NOT DETECTED NOT DETECTED Final   Mycoplasma pneumoniae NOT DETECTED NOT DETECTED Final    Comment: Performed at [facility],  [address], [place], [place] [address]  MRSA Next Gen by PCR, Nasal     Status: None   Collection Time: 01/19/23  5:59 PM   Specimen: Nasal Mucosa; Nasal Swab  Result Value Ref Range Status   MRSA by PCR Next Gen NOT DETECTED NOT DETECTED Final    Comment: (NOTE) The GeneXpert MRSA Assay (FDA approved for NASAL specimens only), is one component of a comprehensive MRSA colonization surveillance program. It is not intended to diagnose MRSA infection nor to guide or monitor treatment for MRSA infections. Test performance is not FDA approved in patients less than 71 years old. Performed at [facility], [address] [place], [place] [address]   C Difficile Quick Screen w PCR reflex     Status: None   Collection Time: 01/20/23  3:46 AM   Specimen: STOOL  Result Value Ref Range Status   C Diff antigen NEGATIVE NEGATIVE Final   C Diff toxin NEGATIVE NEGATIVE Final   C Diff interpretation No C. difficile detected.  Final    Comment: Performed at [facility], [address] [place], [place] [address]     Radiology Studies: EEG adult Result Date: 01/20/2023 Charlsie Quest, MD     01/20/2023  1:10 PM Patient Name: Odie Neher MRN: [id number] Epilepsy Attending: Charlsie Quest Referring Physician/Provider: Clydie Braun, MD Date: 01/20/2023 Duration: 24.04 mins Patient history: 77yo M with ams getting eeg to evaluate for seizure Level of alertness: Awake AEDs during EEG study: None Technical aspects: This EEG study was done with scalp electrodes positioned according to the 10-20 International system of electrode placement. Electrical activity was reviewed with band pass filter of 1-70Hz , sensitivity of 7 uV/mm, display speed of  33mm/sec with a 60Hz  notched filter applied as appropriate. EEG data were recorded continuously and digitally stored.  Video monitoring was available and reviewed as appropriate. Description: The posterior dominant rhythm consists of 7.5 Hz activity  of moderate voltage (25-35 uV) seen predominantly in posterior head regions, symmetric and reactive to eye opening and eye closing. EEG showed continuous 3 to 6 Hz theta-delta slowing with overriding 12-15hz  beta activity in right fronto-temporal region. Hyperventilation did not show any EEG change consistent with breach artifact. Physiologic photic driving was seen during photic stimulation.  ABNORMALITY - Breach artifact, right frontotemporal region IMPRESSION: This study is suggestive of cortical dysfunction arising from right frontotemporal region consistent with underlying craniotomy. No seizures or epileptiform discharges were seen throughout the recording. Charlsie Quest   DG Chest Port 1 View Result Date: 01/19/2023 CLINICAL DATA:  Weakness. EXAM: PORTABLE CHEST 1 VIEW COMPARISON:  April 01, 2020. FINDINGS: The heart size and mediastinal contours are within normal limits. Mild bibasilar opacities are noted concerning for subsegmental atelectasis or possibly infiltrates, left greater than right. The visualized skeletal structures are unremarkable. IMPRESSION: Mild bibasilar subsegmental atelectasis or infiltrates as noted above. Electronically Signed   By: Lupita Raider M.D.   On: 01/19/2023 14:51    Scheduled Meds:  azithromycin  500 mg Oral Daily   calcium carbonate  1 tablet Oral Q supper   clopidogrel  75 mg Oral Q breakfast   DULoxetine  30 mg Oral Daily   enoxaparin (LOVENOX) injection  40 mg Subcutaneous Q24H   finasteride  5 mg Oral Daily   guaiFENesin  600 mg Oral BID   hydrocortisone sodium succinate  50 mg Intravenous Q12H   levothyroxine  88 mcg Oral Q0600   pantoprazole  40 mg Oral Daily   [START ON 01/21/2023] predniSONE  5 mg Oral Q breakfast   simvastatin  20 mg Oral QHS   sodium chloride flush  3 mL Intravenous Q12H   sucralfate  1 g Oral q AM   tamsulosin  0.4 mg Oral q AM   Continuous Infusions:  cefTRIAXone (ROCEPHIN)  IV 2 g (01/20/23 1158)     LOS: 1  day   Time spent: 40 minutes  Carollee Herter, DO  [facility]  01/20/2023, 3:47 PM

## 2023-01-20 NOTE — Progress Notes (Signed)
EEG complete - results pending 

## 2023-01-20 NOTE — Assessment & Plan Note (Addendum)
01-20-2023 supported by LLL pneumonia on CXR. Lactic acid of 2.2, WBC 14.5.  continue with rocephin/zithromax. Pending strep pneumo, legionella antigens. 01-21-2023 present on admission. Resolved by 01-20-2023.

## 2023-01-20 NOTE — Progress Notes (Signed)
MRSA PCR neg. Ok to stop vanc per Dr. Imogene Burn.  Ulyses Southward, PharmD, BCIDP, AAHIVP, CPP Infectious Disease Pharmacist 01/20/2023 9:13 AM

## 2023-01-21 DIAGNOSIS — N179 Acute kidney failure, unspecified: Secondary | ICD-10-CM | POA: Diagnosis not present

## 2023-01-21 DIAGNOSIS — I5022 Chronic systolic (congestive) heart failure: Secondary | ICD-10-CM | POA: Diagnosis not present

## 2023-01-21 DIAGNOSIS — E876 Hypokalemia: Secondary | ICD-10-CM | POA: Insufficient documentation

## 2023-01-21 DIAGNOSIS — J189 Pneumonia, unspecified organism: Secondary | ICD-10-CM | POA: Diagnosis not present

## 2023-01-21 DIAGNOSIS — G9341 Metabolic encephalopathy: Secondary | ICD-10-CM | POA: Diagnosis not present

## 2023-01-21 LAB — COMPREHENSIVE METABOLIC PANEL
ALT: 22 U/L (ref 0–44)
AST: 23 U/L (ref 15–41)
Albumin: 2.3 g/dL — ABNORMAL LOW (ref 3.5–5.0)
Alkaline Phosphatase: 82 U/L (ref 38–126)
Anion gap: 6 (ref 5–15)
BUN: 16 mg/dL (ref 8–23)
CO2: 23 mmol/L (ref 22–32)
Calcium: 7.9 mg/dL — ABNORMAL LOW (ref 8.9–10.3)
Chloride: 108 mmol/L (ref 98–111)
Creatinine, Ser: 0.9 mg/dL (ref 0.61–1.24)
GFR, Estimated: >60 mL/min (ref >60)
Glucose, Bld: 117 mg/dL — ABNORMAL HIGH (ref 70–99)
Potassium: 3.1 mmol/L — ABNORMAL LOW (ref 3.5–5.1)
Sodium: 137 mmol/L (ref 135–145)
Total Bilirubin: 0.7 mg/dL (ref <1.2)
Total Protein: 4.9 g/dL — ABNORMAL LOW (ref 6.5–8.1)

## 2023-01-21 LAB — CBC WITH DIFFERENTIAL/PLATELET
Abs Immature Granulocytes: 0.05 10*3/uL (ref 0.00–0.07)
Basophils Absolute: 0 10*3/uL (ref 0.0–0.1)
Basophils Relative: 0 %
Eosinophils Absolute: 0 10*3/uL (ref 0.0–0.5)
Eosinophils Relative: 0 %
HCT: 33.8 % — ABNORMAL LOW (ref 39.0–52.0)
Hemoglobin: 11.3 g/dL — ABNORMAL LOW (ref 13.0–17.0)
Immature Granulocytes: 0 %
Lymphocytes Relative: 5 %
Lymphs Abs: 0.6 10*3/uL — ABNORMAL LOW (ref 0.7–4.0)
MCH: 28.7 pg (ref 26.0–34.0)
MCHC: 33.4 g/dL (ref 30.0–36.0)
MCV: 85.8 fL (ref 80.0–100.0)
Monocytes Absolute: 0.7 10*3/uL (ref 0.1–1.0)
Monocytes Relative: 6 %
Neutro Abs: 10.3 10*3/uL — ABNORMAL HIGH (ref 1.7–7.7)
Neutrophils Relative %: 89 %
Platelets: 164 10*3/uL (ref 150–400)
RBC: 3.94 MIL/uL — ABNORMAL LOW (ref 4.22–5.81)
RDW: 15.2 % (ref 11.5–15.5)
WBC: 11.6 10*3/uL — ABNORMAL HIGH (ref 4.0–10.5)
nRBC: 0 % (ref 0.0–0.2)

## 2023-01-21 LAB — BRAIN NATRIURETIC PEPTIDE: B Natriuretic Peptide: 335.8 pg/mL — ABNORMAL HIGH (ref 0.0–100.0)

## 2023-01-21 LAB — PROCALCITONIN: Procalcitonin: 0.31 ng/mL

## 2023-01-21 MED ORDER — AZITHROMYCIN 500 MG PO TABS: 500.0000 mg | ORAL_TABLET | Freq: Every day | ORAL | 0 refills | Status: AC

## 2023-01-21 MED ORDER — ONDANSETRON HCL 4 MG PO TABS: 4.0000 mg | ORAL_TABLET | Freq: Every day | ORAL | 0 refills | Status: AC | PRN

## 2023-01-21 MED ORDER — POTASSIUM CHLORIDE CRYS ER 20 MEQ PO TBCR
40.0000 meq | EXTENDED_RELEASE_TABLET | Freq: Once | ORAL | Status: AC
  Administered 2023-01-21: 40 meq via ORAL
  Filled 2023-01-21: qty 2

## 2023-01-21 MED ORDER — CEFADROXIL 500 MG PO CAPS: 500.0000 mg | ORAL_CAPSULE | Freq: Two times a day (BID) | ORAL | 0 refills | Status: AC

## 2023-01-21 NOTE — Plan of Care (Signed)

## 2023-01-21 NOTE — Progress Notes (Signed)
PT Cancellation Note  Patient Details Name: Maxwell Fox MRN: [id number] DOB: Jun 18, 1945   Cancelled Treatment:    Reason Eval/Treat Not Completed: Other (comment)  Patient reported nursing turned his alarm off and he has already walked in the hall with RW this morning. He denies any need for PT prior to discharge.   Jerolyn Center, PT Acute Rehabilitation Services  Office [phone]   Zena Amos 01/21/2023, 9:23 AM

## 2023-01-21 NOTE — Discharge Summary (Addendum)
[facility] Phys[facility] Discharge Summary   Patient name: Maxwell Fox  Admit date:     01/19/2023  Discharge date: 01/21/2023  Attending Physician: Clydie Braun [[id number]]  Discharge Physician: Carollee Herter   PCP: Pincus Sanes, MD  Admitted From: Home  Disposition:  Home  Recommendations for Outpatient Follow-up:  Follow up with PCP in 1-2 weeks Please follow up on the following pending results: Legionella urinary antigen  Home Health:No Equipment/Devices: None  Discharge Condition:Stable CODE STATUS:FULL Diet recommendation: Regular Fluid Restriction: None  Hospital Summary: HPI: Maxwell Fox is a 77 y.o. male with medical history significant of hypertension, melanoma with brain  metastasis, hypothyroidism, depression, iron deficiency anemia, BPH, history of GI bleed, s/p gastric bypass, and GERD who presents with sudden onset of generalized weakness. He reports being unable to get up from his bed, requiring assistance from his wife to move to a chair.  Normally able to ambulate without need of assistance he denies any recent cough, shortness of breath, stomach pain, nausea, vomiting, or diarrhea. He reports difficulty in lifting his leg and weakness in his arms. He also mentions episodes of mental confusion, during which he was disoriented and had difficulty responding to questions. He has been experiencing these episodes of confusion intermittently. He also reports recent issues with urinary incontinence, stating that he has had difficulty reaching the toilet in time during the night.   Patient just recently had MRI of the brain at [facility] on 12/13 due to reports of increasing confusion.  Which noted stable appearance of multiple treated lesions, stable left temporal lobe enhancing lesion, and no evidence of new intracranial metastases.    In the emergency department patient was noted to be afebrile with respirations elevated up to 30, and all other vital signs  relatively maintained.  Labs significant for WBC 14.5.  Chest x-ray significant for mild bibasilar segmental atelectasis or infiltrates noted to be greater on the left than on the right.  Urinalysis showed no signs for infection.  Patient had been given 500 mL bolus of fluids, vancomycin, and Rocephin.    Significant Events: Admitted 01/19/2023 for sepsis, CAP, weakness   Significant Labs: Admission WBC 14.5, BUN 17, Scr 1.34, procal 0.55, lactic acid 2.2  Significant Imaging Studies: Cxr shows Mild bibasilar subsegmental atelectasis or infiltrates L > R.  Antibiotic Therapy: Anti-infectives (From admission, onward)    Start     Dose/Rate Route Frequency Ordered Stop   01/20/23 1700  Vancomycin (VANCOCIN) 1,500 mg in sodium chloride 0.9 % 500 mL IVPB        1,500 mg 250 mL/hr over 120 Minutes Intravenous Every 24 hours 01/19/23 1845     01/20/23 1200  cefTRIAXone (ROCEPHIN) 2 g in sodium chloride 0.9 % 100 mL IVPB        2 g 200 mL/hr over 30 Minutes Intravenous Every 24 hours 01/19/23 1649 01/24/23 1159   01/19/23 1900  vancomycin (VANCOCIN) IVPB 1000 mg/200 mL premix        1,000 mg 200 mL/hr over 60 Minutes Intravenous  Once 01/19/23 1732 01/19/23 1951   01/19/23 1800  azithromycin (ZITHROMAX) 500 mg in sodium chloride 0.9 % 250 mL IVPB        500 mg 250 mL/hr over 60 Minutes Intravenous Every 24 hours 01/19/23 1649 01/24/23 1759   01/19/23 1630  vancomycin (VANCOCIN) IVPB 1000 mg/200 mL premix        1,000 mg 200 mL/hr over 60 Minutes Intravenous  Once 01/19/23 1621 01/19/23  1800   01/19/23 1630  cefTRIAXone (ROCEPHIN) 2 g in sodium chloride 0.9 % 100 mL IVPB        2 g 200 mL/hr over 30 Minutes Intravenous  Once 01/19/23 1621 01/19/23 1733       Procedures:   Consultants:    Hospital Course by Problem: * severe Sepsis due to pneumonia (HCC) 01-20-2023 supported by LLL pneumonia on CXR. Lactic acid of 2.2, WBC 14.5.  continue with rocephin/zithromax. Pending strep  pneumo, legionella antigens. 01-21-2023 present on admission. Resolved by 01-20-2023.  LLL pneumonia 01-20-2023 LLL pneumonia on CXR. continue with rocephin/zithromax. Pending strep pneumo, legionella antigens. 01-21-2023 strep pneumo negative. Legionella pending. Remains on RA. WBC 11.6. will treat for a total of 7 day with abx. DC to home with duricef for 5 days and 4 more days of zithromax. Pneumonia Antigens Lab Results  Component Value Date/Time   [facility] NEGATIVE 01/19/2023 12:21 PM         Acute metabolic encephalopathy 01-20-2023 improved and resolved after treatment for pneumonia. EEG negative for seizures. Recent MRI brain 01-15-2023 shows stable melanoma in his brain. Pt states he had gamma knife and prior craniotomy.  01-21-2023 resolved  Current chronic use of systemic steroids 01-20-2023 follows with endocrinology. Pt has hx of central adrenal insufficiency,  central hypothyroidism. Change back to po prednisone. Stop IV hydrocortisone. 01-21-2023 stable. Continue home prednisone 5 mg daily.  Chronic systolic CHF (congestive heart failure) (HCC) 01-20-2023 stable. Did receive 1 dose of IV lasix in ER. No CHF on cxr. 01-21-2023 pt did not have CHF exacerbation. Baseline BNP 335.  AKI (acute kidney injury) (HCC) 01-20-2023 resolved with IVF.  Elevated troponin 01-20-2023 troponin mildly elevated by flat. Not NSTEMI.  Weakness 01-20-2023 likely due to pneumonia.  01-21-2023 improved  Hypothyroid 01-20-2023 continue synthroid 88 mcg daily. 01-21-2023 stable  Metastatic malignant melanoma (HCC) 01-20-2023 followed by [facility]. 01-21-2023 stable  BPH (benign prostatic hyperplasia) 01-20-2023 continue proscar, flomax. 01-21-2023 stable  Hypokalemia Give 40 meq po kcl prior to DC.    Discharge Diagnoses:  Principal Problem:   severe Sepsis due to pneumonia [facility]) Active Problems:   LLL pneumonia   Acute metabolic encephalopathy   BPH (benign  prostatic hyperplasia)   Metastatic malignant melanoma (HCC)   Hypothyroid   Weakness   Elevated troponin   AKI (acute kidney injury) (HCC)   Chronic systolic CHF (congestive heart failure) (HCC)   Current chronic use of systemic steroids   Hypokalemia   Discharge Instructions  Discharge Instructions     Call MD for:  extreme fatigue   Complete by: As directed    Call MD for:  persistant dizziness or light-headedness   Complete by: As directed    Call MD for:  persistant nausea and vomiting   Complete by: As directed    Call MD for:  severe uncontrolled pain   Complete by: As directed    Call MD for:  temperature >100.4   Complete by: As directed    Diet - low sodium heart healthy   Complete by: As directed    Discharge instructions   Complete by: As directed    1. Follow up with your primary care provider in 1-2 weeks following hospital discharge   Increase activity slowly   Complete by: As directed       Allergies as of 01/21/2023       Reactions   Rosuvastatin Other (See Comments)   Leg pains  Medication List     TAKE these medications    acetaminophen 325 MG tablet Commonly known as: TYLENOL Take 2 tablets (650 mg total) by mouth every 6 (six) hours as needed for moderate pain.   Aspirin Low Dose 81 MG chewable tablet Generic drug: aspirin Chew 1 tablet (81 mg total) by mouth daily.   azithromycin 500 MG tablet Commonly known as: ZITHROMAX Take 1 tablet (500 mg total) by mouth daily for 4 days. Start taking on: January 22, 2023   calcium carbonate 1250 (500 Ca) MG tablet Commonly known as: OS-CAL - dosed in mg of elemental calcium Take 1 tablet by mouth daily with supper.   cefadroxil 500 MG capsule Commonly known as: DURICEF Take 1 capsule (500 mg total) by mouth 2 (two) times daily for 5 days.   clonazePAM 1 MG tablet Commonly known as: KLONOPIN Take 1 tablet (1 mg total) by mouth 2 (two) times daily.   clopidogrel 75 MG  tablet Commonly known as: PLAVIX Take 1 tablet (75 mg total) by mouth daily with breakfast.   DULoxetine 30 MG capsule Commonly known as: Cymbalta Take 1 capsule (30 mg total) by mouth daily.   finasteride 5 MG tablet Commonly known as: PROSCAR TAKE 1 TABLET(5 MG) BY MOUTH DAILY   levothyroxine 88 MCG tablet Commonly known as: SYNTHROID TAKE 1 TABLET BY MOUTH EVERY DAY BEFORE BREAKFAST   multivitamin with minerals Tabs tablet Take 1 tablet by mouth in the morning.   ondansetron 4 MG tablet Commonly known as: ZOFRAN TAKE 1 TABLET(4 MG) BY MOUTH EVERY 8 HOURS AS NEEDED FOR NAUSEA OR VOMITING What changed: Another medication with the same name was added. Make sure you understand how and when to take each.   ondansetron 4 MG tablet Commonly known as: Zofran Take 1 tablet (4 mg total) by mouth daily as needed for nausea or vomiting. What changed: You were already taking a medication with the same name, and this prescription was added. Make sure you understand how and when to take each.   oxyCODONE 5 MG immediate release tablet Commonly known as: Roxicodone Take 1 tablet (5 mg total) by mouth every 8 (eight) hours as needed for severe pain (pain score 7-10) (chronic back pain). MDD 3   pantoprazole 40 MG tablet Commonly known as: Protonix Take 1 tablet (40 mg total) by mouth daily. Please follow up with primary care provider for additional refills.   predniSONE 5 MG tablet Commonly known as: DELTASONE TAKE 1 TABLET BY MOUTH EVERY DAY WITH BREAKFAST   simvastatin 20 MG tablet Commonly known as: ZOCOR Take 1 tablet (20 mg total) by mouth at bedtime.   tamsulosin 0.4 MG Caps capsule Commonly known as: FLOMAX TAKE 1 CAPSULE BY MOUTH EVERY DAY AFTER SUPPER What changed:  how much to take how to take this when to take this additional instructions   Vitamin D 50 MCG (2000 UT) tablet Take 1 tablet (2,000 Units total) by mouth daily.        Allergies  Allergen Reactions    Rosuvastatin Other (See Comments)    Leg pains    Discharge Exam: Vitals:   01/21/23 0403 01/21/23 0738  BP: 103/70 122/68  Pulse: 72 69  Resp: 17 17  Temp: 97.6 F (36.4 C) 98.3 F (36.8 C)  SpO2: 96% 95%    Physical Exam Vitals and nursing note reviewed.  HENT:     Head: Normocephalic and atraumatic.     Nose: Nose normal.  Cardiovascular:  Rate and Rhythm: Normal rate and regular rhythm.  Pulmonary:     Effort: Pulmonary effort is normal.     Breath sounds: Normal breath sounds. No rales.  Abdominal:     General: Abdomen is flat. Bowel sounds are normal.     Palpations: Abdomen is soft.  Skin:    General: Skin is warm and dry.     Capillary Refill: Capillary refill takes less than 2 seconds.  Neurological:     Mental Status: He is alert and oriented to person, place, and time.     The results of significant diagnostics from this hospitalization (including imaging, microbiology, ancillary and laboratory) are listed below for reference.    Microbiology: Recent Results (from the past 240 hours)  Resp panel by RT-PCR (RSV, Flu A&B, Covid) Anterior Nasal Swab     Status: None   Collection Time: 01/19/23 12:21 PM   Specimen: Anterior Nasal Swab  Result Value Ref Range Status   SARS Coronavirus 2 by RT PCR NEGATIVE NEGATIVE Final   Influenza A by PCR NEGATIVE NEGATIVE Final   Influenza B by PCR NEGATIVE NEGATIVE Final    Comment: (NOTE) The Xpert Xpress SARS-CoV-2/FLU/RSV plus assay is intended as an aid in the diagnosis of influenza from Nasopharyngeal swab specimens and should not be used as a sole basis for treatment. Nasal washings and aspirates are unacceptable for Xpert Xpress SARS-CoV-2/FLU/RSV testing.  Fact Sheet for Patients: BloggerCourse.com  Fact Sheet for Healthcare Providers: SeriousBroker.it  This test is not yet approved or cleared by the [place] FDA and has been authorized for  detection and/or diagnosis of SARS-CoV-2 by FDA under an Emergency Use Authorization (EUA). This EUA will remain in effect (meaning this test can be used) for the duration of the COVID-19 declaration under Section 564(b)(1) of the Act, 21 U.S.C. section 360bbb-3(b)(1), unless the authorization is terminated or revoked.     Resp Syncytial Virus by PCR NEGATIVE NEGATIVE Final    Comment: (NOTE) Fact Sheet for Patients: BloggerCourse.com  Fact Sheet for Healthcare Providers: SeriousBroker.it  This test is not yet approved or cleared by the [place] FDA and has been authorized for detection and/or diagnosis of SARS-CoV-2 by FDA under an Emergency Use Authorization (EUA). This EUA will remain in effect (meaning this test can be used) for the duration of the COVID-19 declaration under Section 564(b)(1) of the Act, 21 U.S.C. section 360bbb-3(b)(1), unless the authorization is terminated or revoked.  Performed at [facility], [address], [place], [place] [address]   Blood Culture (routine x 2)     Status: None (Preliminary result)   Collection Time: 01/19/23  4:49 PM   Specimen: BLOOD RIGHT FOREARM  Result Value Ref Range Status   Specimen Description BLOOD RIGHT FOREARM  Final   Special Requests   Final    BOTTLES DRAWN AEROBIC AND ANAEROBIC Blood Culture adequate volume   Culture   Final    NO GROWTH < 24 HOURS Performed at [facility], [address] [place], [place] [address]    Report Status PENDING  Incomplete  Blood Culture (routine x 2)     Status: None (Preliminary result)   Collection Time: 01/19/23  4:50 PM   Specimen: BLOOD RIGHT ARM  Result Value Ref Range Status   Specimen Description BLOOD RIGHT ARM  Final   Special Requests   Final    BOTTLES DRAWN AEROBIC AND ANAEROBIC Blood Culture adequate volume   Culture   Final  NO GROWTH < 24 HOURS Performed at [facility], [address] [place], [place] [address]    Report Status PENDING  Incomplete  Respiratory (~20 pathogens) panel by PCR     Status: None   Collection Time: 01/19/23  5:24 PM   Specimen: Nasopharyngeal Swab; Respiratory  Result Value Ref Range Status   Adenovirus NOT DETECTED NOT DETECTED Final   Coronavirus 229E NOT DETECTED NOT DETECTED Final    Comment: (NOTE) The Coronavirus on the Respiratory Panel, DOES NOT test for the novel  Coronavirus (2019 nCoV)    Coronavirus HKU1 NOT DETECTED NOT DETECTED Final   Coronavirus NL63 NOT DETECTED NOT DETECTED Final   Coronavirus OC43 NOT DETECTED NOT DETECTED Final   Metapneumovirus NOT DETECTED NOT DETECTED Final   Rhinovirus / Enterovirus NOT DETECTED NOT DETECTED Final   Influenza A NOT DETECTED NOT DETECTED Final   Influenza B NOT DETECTED NOT DETECTED Final   Parainfluenza Virus 1 NOT DETECTED NOT DETECTED Final   Parainfluenza Virus 2 NOT DETECTED NOT DETECTED Final   Parainfluenza Virus 3 NOT DETECTED NOT DETECTED Final   Parainfluenza Virus 4 NOT DETECTED NOT DETECTED Final   Respiratory Syncytial Virus NOT DETECTED NOT DETECTED Final   Bordetella pertussis NOT DETECTED NOT DETECTED Final   Bordetella Parapertussis NOT DETECTED NOT DETECTED Final   Chlamydophila pneumoniae NOT DETECTED NOT DETECTED Final   Mycoplasma pneumoniae NOT DETECTED NOT DETECTED Final    Comment: Performed at [facility], [address], [place], [place] [address]  MRSA Next Gen by PCR, Nasal     Status: None   Collection Time: 01/19/23  5:59 PM   Specimen: Nasal Mucosa; Nasal Swab  Result Value Ref Range Status   MRSA by PCR Next Gen NOT DETECTED NOT DETECTED Final    Comment: (NOTE) The GeneXpert MRSA Assay (FDA approved for NASAL specimens only), is one component of a comprehensive MRSA colonization surveillance program. It is not intended to diagnose MRSA infection nor to guide or monitor treatment for MRSA infections. Test performance is not FDA approved  in patients less than 39 years old. Performed at [facility], [address] [place], [place] [address]   C Difficile Quick Screen w PCR reflex     Status: None   Collection Time: 01/20/23  3:46 AM   Specimen: STOOL  Result Value Ref Range Status   C Diff antigen NEGATIVE NEGATIVE Final   C Diff toxin NEGATIVE NEGATIVE Final   C Diff interpretation No C. difficile detected.  Final    Comment: Performed at [facility], [address], [place], [place] [address]     Labs: BNP (last 3 results) Recent Labs    01/19/23 1116 01/21/23 0710  BNP 400.4* 335.8*   Basic Metabolic Panel: Recent Labs  Lab 01/19/23 1116 01/20/23 0647 01/21/23 0710  NA 135 137 137  K 4.7 3.7 3.1*  CL 103 104 108  CO2 23 21* 23  GLUCOSE 83 90 117*  BUN 17 18 16   CREATININE 1.34* 1.12 0.90  CALCIUM 8.9 8.2* 7.9*   Liver Function Tests: Recent Labs  Lab 01/19/23 1116 01/21/23 0710  AST 32 23  ALT 33 22  ALKPHOS 110 82  BILITOT 1.2* 0.7  PROT 5.8* 4.9*  ALBUMIN 2.9* 2.3*   CBC: Recent Labs  Lab 01/19/23 1116 01/20/23 0647 01/21/23 0710  WBC 14.5* 13.6* 11.6*  NEUTROABS 11.3*  --  10.3*  HGB 13.4 11.8*  11.3*  HCT 41.9 35.7* 33.8*  MCV 88.0 86.2 85.8  PLT 203 179 164   Cardiac Enzymes: Recent Labs  Lab 01/19/23 1221  CKTOTAL 54   BNP: Recent Labs  Lab 01/19/23 1116 01/21/23 0710  BNP 400.4* 335.8*   Thyroid function studies Recent Labs    01/19/23 1650  TSH 1.157   Urinalysis    Component Value Date/Time   COLORURINE YELLOW 01/19/2023 1221   APPEARANCEUR CLEAR 01/19/2023 1221   LABSPEC 1.010 01/19/2023 1221   PHURINE 7.0 01/19/2023 1221   GLUCOSEU NEGATIVE 01/19/2023 1221   HGBUR NEGATIVE 01/19/2023 1221   BILIRUBINUR NEGATIVE 01/19/2023 1221   KETONESUR NEGATIVE 01/19/2023 1221   PROTEINUR NEGATIVE 01/19/2023 1221   NITRITE NEGATIVE 01/19/2023 1221   LEUKOCYTESUR NEGATIVE 01/19/2023 1221   Sepsis Labs Recent Labs  Lab 01/19/23 1116  01/20/23 0647 01/21/23 0710  WBC 14.5* 13.6* 11.6*   Pneumonia Antigens Lab Results  Component Value Date/Time   [facility] NEGATIVE 01/19/2023 12:21 PM    Microbiology Recent Results (from the past 240 hours)  Resp panel by RT-PCR (RSV, Flu A&B, Covid) Anterior Nasal Swab     Status: None   Collection Time: 01/19/23 12:21 PM   Specimen: Anterior Nasal Swab  Result Value Ref Range Status   SARS Coronavirus 2 by RT PCR NEGATIVE NEGATIVE Final   Influenza A by PCR NEGATIVE NEGATIVE Final   Influenza B by PCR NEGATIVE NEGATIVE Final    Comment: (NOTE) The Xpert Xpress SARS-CoV-2/FLU/RSV plus assay is intended as an aid in the diagnosis of influenza from Nasopharyngeal swab specimens and should not be used as a sole basis for treatment. Nasal washings and aspirates are unacceptable for Xpert Xpress SARS-CoV-2/FLU/RSV testing.  Fact Sheet for Patients: BloggerCourse.com  Fact Sheet for Healthcare Providers: SeriousBroker.it  This test is not yet approved or cleared by the [place] FDA and has been authorized for detection and/or diagnosis of SARS-CoV-2 by FDA under an Emergency Use Authorization (EUA). This EUA will remain in effect (meaning this test can be used) for the duration of the COVID-19 declaration under Section 564(b)(1) of the Act, 21 U.S.C. section 360bbb-3(b)(1), unless the authorization is terminated or revoked.     Resp Syncytial Virus by PCR NEGATIVE NEGATIVE Final    Comment: (NOTE) Fact Sheet for Patients: BloggerCourse.com  Fact Sheet for Healthcare Providers: SeriousBroker.it  This test is not yet approved or cleared by the [place] FDA and has been authorized for detection and/or diagnosis of SARS-CoV-2 by FDA under an Emergency Use Authorization (EUA). This EUA will remain in effect (meaning this test can be used) for the duration of  the COVID-19 declaration under Section 564(b)(1) of the Act, 21 U.S.C. section 360bbb-3(b)(1), unless the authorization is terminated or revoked.  Performed at [facility], [address] [place], [place] [address]   Blood Culture (routine x 2)     Status: None (Preliminary result)   Collection Time: 01/19/23  4:49 PM   Specimen: BLOOD RIGHT FOREARM  Result Value Ref Range Status   Specimen Description BLOOD RIGHT FOREARM  Final   Special Requests   Final    BOTTLES DRAWN AEROBIC AND ANAEROBIC Blood Culture adequate volume   Culture   Final    NO GROWTH < 24 HOURS Performed at [facility], [address] [place], [place] [address]    Report Status PENDING  Incomplete  Blood Culture (routine x 2)     Status: None (Preliminary result)  Collection Time: 01/19/23  4:50 PM   Specimen: BLOOD RIGHT ARM  Result Value Ref Range Status   Specimen Description BLOOD RIGHT ARM  Final   Special Requests   Final    BOTTLES DRAWN AEROBIC AND ANAEROBIC Blood Culture adequate volume   Culture   Final    NO GROWTH < 24 HOURS Performed at [facility], [address] [place], [place] [address]    Report Status PENDING  Incomplete  Respiratory (~20 pathogens) panel by PCR     Status: None   Collection Time: 01/19/23  5:24 PM   Specimen: Nasopharyngeal Swab; Respiratory  Result Value Ref Range Status   Adenovirus NOT DETECTED NOT DETECTED Final   Coronavirus 229E NOT DETECTED NOT DETECTED Final    Comment: (NOTE) The Coronavirus on the Respiratory Panel, DOES NOT test for the novel  Coronavirus (2019 nCoV)    Coronavirus HKU1 NOT DETECTED NOT DETECTED Final   Coronavirus NL63 NOT DETECTED NOT DETECTED Final   Coronavirus OC43 NOT DETECTED NOT DETECTED Final   Metapneumovirus NOT DETECTED NOT DETECTED Final   Rhinovirus / Enterovirus NOT DETECTED NOT DETECTED Final   Influenza A NOT DETECTED NOT DETECTED Final   Influenza B NOT DETECTED NOT DETECTED Final    Parainfluenza Virus 1 NOT DETECTED NOT DETECTED Final   Parainfluenza Virus 2 NOT DETECTED NOT DETECTED Final   Parainfluenza Virus 3 NOT DETECTED NOT DETECTED Final   Parainfluenza Virus 4 NOT DETECTED NOT DETECTED Final   Respiratory Syncytial Virus NOT DETECTED NOT DETECTED Final   Bordetella pertussis NOT DETECTED NOT DETECTED Final   Bordetella Parapertussis NOT DETECTED NOT DETECTED Final   Chlamydophila pneumoniae NOT DETECTED NOT DETECTED Final   Mycoplasma pneumoniae NOT DETECTED NOT DETECTED Final    Comment: Performed at [facility], [address], [place], [place] [address]  MRSA Next Gen by PCR, Nasal     Status: None   Collection Time: 01/19/23  5:59 PM   Specimen: Nasal Mucosa; Nasal Swab  Result Value Ref Range Status   MRSA by PCR Next Gen NOT DETECTED NOT DETECTED Final    Comment: (NOTE) The GeneXpert MRSA Assay (FDA approved for NASAL specimens only), is one component of a comprehensive MRSA colonization surveillance program. It is not intended to diagnose MRSA infection nor to guide or monitor treatment for MRSA infections. Test performance is not FDA approved in patients less than 21 years old. Performed at [facility], [address], [place], [place] [address]   C Difficile Quick Screen w PCR reflex     Status: None   Collection Time: 01/20/23  3:46 AM   Specimen: STOOL  Result Value Ref Range Status   C Diff antigen NEGATIVE NEGATIVE Final   C Diff toxin NEGATIVE NEGATIVE Final   C Diff interpretation No C. difficile detected.  Final    Comment: Performed at [facility], [address], [place], [place] [address]    Procedures/Studies: EEG adult Result Date: 01/20/2023 Charlsie Quest, MD     01/20/2023  1:10 PM Patient Name: Dago Mom MRN: [id number] Epilepsy Attending: Charlsie Quest Referring Physician/Provider: Clydie Braun, MD Date: 01/20/2023 Duration: 24.04 mins Patient history: 77yo M with ams getting eeg to  evaluate for seizure Level of alertness: Awake AEDs during EEG study: None Technical aspects: This EEG study was done with scalp electrodes positioned according to the 10-20 International system of electrode placement. Electrical activity was reviewed  with band pass filter of 1-70Hz , sensitivity of 7 uV/mm, display speed of 54mm/sec with a 60Hz  notched filter applied as appropriate. EEG data were recorded continuously and digitally stored.  Video monitoring was available and reviewed as appropriate. Description: The posterior dominant rhythm consists of 7.5 Hz activity of moderate voltage (25-35 uV) seen predominantly in posterior head regions, symmetric and reactive to eye opening and eye closing. EEG showed continuous 3 to 6 Hz theta-delta slowing with overriding 12-15hz  beta activity in right fronto-temporal region. Hyperventilation did not show any EEG change consistent with breach artifact. Physiologic photic driving was seen during photic stimulation.  ABNORMALITY - Breach artifact, right frontotemporal region IMPRESSION: This study is suggestive of cortical dysfunction arising from right frontotemporal region consistent with underlying craniotomy. No seizures or epileptiform discharges were seen throughout the recording. Charlsie Quest   DG Chest Port 1 View Result Date: 01/19/2023 CLINICAL DATA:  Weakness. EXAM: PORTABLE CHEST 1 VIEW COMPARISON:  April 01, 2020. FINDINGS: The heart size and mediastinal contours are within normal limits. Mild bibasilar opacities are noted concerning for subsegmental atelectasis or possibly infiltrates, left greater than right. The visualized skeletal structures are unremarkable. IMPRESSION: Mild bibasilar subsegmental atelectasis or infiltrates as noted above. Electronically Signed   By: Lupita Raider M.D.   On: 01/19/2023 14:51   DG Bone Density Result Date: 01/04/2023 Table formatting from the original result was not included. Date of study: 01/04/2023 Exam:  DUAL X-RAY ABSORPTIOMETRY (DXA) FOR BONE MINERAL DENSITY (BMD) Instrument: [employer] Requesting Provider: PCP Indication: screening for osteoporosis Comparison:  2022 Clinical data: Pt is a 77 y.o. male with history of fracture. Results:  Lumbar spine L1-L4 Femoral neck (FN) 33% distal radius T-score   -0.4 RFN: -3.2 LFN: -2.9 + 0.2 Change in BMD from previous DXA test (%) Up 4.0%* Down 11.9%* n/a (*) statistically significant Assessment: Patient has OSTEOPOROSIS according to the [facility] classification for osteoporosis (see below). . Fracture risk: high Comments: the technical quality of the study is good. In patients older than 75, the high prevalence of degenerative changes in the lumbar spine results in artificially higher bone density readings. L2 vertebra had to be excluded from analysis due to DJD WHO criteria for diagnosis of osteoporosis in postmenopausal women and in men 42 y/o or older: - normal: T-score -1.0 to + 1.0 - osteopenia/low bone density: T-score between -2.5 and -1.0 - osteoporosis: T-score below -2.5 - severe osteoporosis: T-score below -2.5 with history of fragility fracture Note: although not part of the WHO classification, the presence of a fragility fracture, regardless of the T-score, should be considered diagnostic of osteoporosis, provided other causes for the fracture have been excluded. Treatment: The National Osteoporosis Foundation recommends that treatment be considered in postmenopausal women and men age 23 or older with: 1. Hip or vertebral (clinical or morphometric) fracture 2. T-score of - 2.5 or lower at the spine or hip 3. 10-year fracture probability by FRAX of at least 20% for a major osteoporotic fracture and 3% for a hip fracture Followup: Repeat BMD in 2 years or as clinically indicated Interpreted by : Lyndle Herrlich, MD [facility]    Time coordinating discharge: 45 mins  SIGNED:  Carollee Herter, DO [facility] 01/21/23, 10:45 AM

## 2023-01-21 NOTE — Progress Notes (Addendum)
PROGRESS NOTE    Maxwell Fox  ZOX:[id number] DOB: Jan 07, 1946 DOA: 01/19/2023 PCP: Pincus Sanes, MD  Subjective: Pt seen and examined. No further diarrhea WBC 11.6 Strep pneumo negative Awaiting procal Pt feeling better. Remains on RA No fevers. Wants to leave this AM.   Hospital Course: HPI: Maxwell Fox is a 77 y.o. male with medical history significant of hypertension, melanoma with brain  metastasis, hypothyroidism, depression, iron deficiency anemia, BPH, history of GI bleed, s/p gastric bypass, and GERD who presents with sudden onset of generalized weakness. He reports being unable to get up from his bed, requiring assistance from his wife to move to a chair.  Normally able to ambulate without need of assistance he denies any recent cough, shortness of breath, stomach pain, nausea, vomiting, or diarrhea. He reports difficulty in lifting his leg and weakness in his arms. He also mentions episodes of mental confusion, during which he was disoriented and had difficulty responding to questions. He has been experiencing these episodes of confusion intermittently. He also reports recent issues with urinary incontinence, stating that he has had difficulty reaching the toilet in time during the night.   Patient just recently had MRI of the brain at [facility] on 12/13 due to reports of increasing confusion.  Which noted stable appearance of multiple treated lesions, stable left temporal lobe enhancing lesion, and no evidence of new intracranial metastases.    In the emergency department patient was noted to be afebrile with respirations elevated up to 30, and all other vital signs relatively maintained.  Labs significant for WBC 14.5.  Chest x-ray significant for mild bibasilar segmental atelectasis or infiltrates noted to be greater on the left than on the right.  Urinalysis showed no signs for infection.  Patient had been given 500 mL bolus of fluids, vancomycin, and Rocephin.     Significant Events: Admitted 01/19/2023 for sepsis, CAP, weakness   Significant Labs: Admission WBC 14.5, BUN 17, Scr 1.34, procal 0.55, lactic acid 2.2  Significant Imaging Studies: Cxr shows Mild bibasilar subsegmental atelectasis or infiltrates L > R.  Antibiotic Therapy: Anti-infectives (From admission, onward)    Start     Dose/Rate Route Frequency Ordered Stop   01/20/23 1700  Vancomycin (VANCOCIN) 1,500 mg in sodium chloride 0.9 % 500 mL IVPB        1,500 mg 250 mL/hr over 120 Minutes Intravenous Every 24 hours 01/19/23 1845     01/20/23 1200  cefTRIAXone (ROCEPHIN) 2 g in sodium chloride 0.9 % 100 mL IVPB        2 g 200 mL/hr over 30 Minutes Intravenous Every 24 hours 01/19/23 1649 01/24/23 1159   01/19/23 1900  vancomycin (VANCOCIN) IVPB 1000 mg/200 mL premix        1,000 mg 200 mL/hr over 60 Minutes Intravenous  Once 01/19/23 1732 01/19/23 1951   01/19/23 1800  azithromycin (ZITHROMAX) 500 mg in sodium chloride 0.9 % 250 mL IVPB        500 mg 250 mL/hr over 60 Minutes Intravenous Every 24 hours 01/19/23 1649 01/24/23 1759   01/19/23 1630  vancomycin (VANCOCIN) IVPB 1000 mg/200 mL premix        1,000 mg 200 mL/hr over 60 Minutes Intravenous  Once 01/19/23 1621 01/19/23 1800   01/19/23 1630  cefTRIAXone (ROCEPHIN) 2 g in sodium chloride 0.9 % 100 mL IVPB        2 g 200 mL/hr over 30 Minutes Intravenous  Once 01/19/23 1621 01/19/23 1733  Procedures:   Consultants:     Assessment and Plan: * severe Sepsis due to pneumonia (HCC) 01-20-2023 supported by LLL pneumonia on CXR. Lactic acid of 2.2, WBC 14.5.  continue with rocephin/zithromax. Pending strep pneumo, legionella antigens. 01-21-2023 present on admission. Resolved by 01-20-2023.  LLL pneumonia 01-20-2023 LLL pneumonia on CXR. continue with rocephin/zithromax. Pending strep pneumo, legionella antigens. 01-21-2023 strep pneumo negative. Legionella pending. Remains on RA. WBC 11.6. will treat for a  total of 7 day with abx. DC to home with duricef for 5 days and 4 more days of zithromax. Pneumonia Antigens Lab Results  Component Value Date/Time   [facility] NEGATIVE 01/19/2023 12:21 PM         Acute metabolic encephalopathy 01-20-2023 improved and resolved after treatment for pneumonia. EEG negative for seizures. Recent MRI brain 01-15-2023 shows stable melanoma in his brain. Pt states he had gamma knife and prior craniotomy.  01-21-2023 resolved  Current chronic use of systemic steroids 01-20-2023 follows with endocrinology. Pt has hx of central adrenal insufficiency,  central hypothyroidism. Change back to po prednisone. Stop IV hydrocortisone. 01-21-2023 stable. Continue home prednisone 5 mg daily.  Chronic systolic CHF (congestive heart failure) (HCC) 01-20-2023 stable. Did receive 1 dose of IV lasix in ER. No CHF on cxr. 01-21-2023 pt did not have CHF exacerbation. Baseline BNP 335.  AKI (acute kidney injury) (HCC) 01-20-2023 resolved with IVF.  Elevated troponin 01-20-2023 troponin mildly elevated by flat. Not NSTEMI.  Weakness 01-20-2023 likely due to pneumonia.  01-21-2023 improved  Hypothyroid 01-20-2023 continue synthroid 88 mcg daily. 01-21-2023 stable  Metastatic malignant melanoma (HCC) 01-20-2023 followed by [facility]. 01-21-2023 stable  BPH (benign prostatic hyperplasia) 01-20-2023 continue proscar, flomax. 01-21-2023 stable  Hypokalemia Give 40 meq po kcl prior to DC.   DVT prophylaxis: enoxaparin (LOVENOX) injection 40 mg Start: 01/19/23 2200    Code Status: Full Code Family Communication: no family at bedside Disposition Plan: return home Reason for continuing need for hospitalization: DC today.  Objective: Vitals:   01/20/23 1900 01/20/23 2302 01/21/23 0403 01/21/23 0738  BP: 104/65 113/67 103/70 122/68  Pulse: 74 72 72 69  Resp: 17 17 17 17   Temp: 97.7 F (36.5 C) 97.7 F (36.5 C) 97.6 F (36.4 C) 98.3 F (36.8 C)  TempSrc:  Oral Oral Oral Oral  SpO2: 95% 94% 96% 95%  Weight:      Height:        Intake/Output Summary (Last 24 hours) at 01/21/2023 1045 Last data filed at 01/21/2023 0903 Gross per 24 hour  Intake 240 ml  Output 1315 ml  Net -1075 ml   Filed Weights   01/19/23 1116 01/19/23 1800 01/20/23 0617  Weight: 83.9 kg 87.1 kg 87.8 kg    Examination:  Physical Exam Vitals and nursing note reviewed.  HENT:     Head: Normocephalic and atraumatic.     Nose: Nose normal.  Cardiovascular:     Rate and Rhythm: Normal rate and regular rhythm.  Pulmonary:     Effort: Pulmonary effort is normal.     Breath sounds: Normal breath sounds. No rales.  Abdominal:     General: Abdomen is flat. Bowel sounds are normal.     Palpations: Abdomen is soft.  Skin:    General: Skin is warm and dry.     Capillary Refill: Capillary refill takes less than 2 seconds.  Neurological:     Mental Status: He is alert and oriented to person, place, and time.     Data  Reviewed: I have personally reviewed following labs and imaging studies  CBC: Recent Labs  Lab 01/19/23 1116 01/20/23 0647 01/21/23 0710  WBC 14.5* 13.6* 11.6*  NEUTROABS 11.3*  --  10.3*  HGB 13.4 11.8* 11.3*  HCT 41.9 35.7* 33.8*  MCV 88.0 86.2 85.8  PLT 203 179 164   Basic Metabolic Panel: Recent Labs  Lab 01/19/23 1116 01/20/23 0647 01/21/23 0710  NA 135 137 137  K 4.7 3.7 3.1*  CL 103 104 108  CO2 23 21* 23  GLUCOSE 83 90 117*  BUN 17 18 16   CREATININE 1.34* 1.12 0.90  CALCIUM 8.9 8.2* 7.9*   GFR: Estimated Creatinine Clearance: 73.2 mL/min (by C-G formula based on SCr of 0.9 mg/dL). Liver Function Tests: Recent Labs  Lab 01/19/23 1116 01/21/23 0710  AST 32 23  ALT 33 22  ALKPHOS 110 82  BILITOT 1.2* 0.7  PROT 5.8* 4.9*  ALBUMIN 2.9* 2.3*   Coagulation Profile: Recent Labs  Lab 01/19/23 1221  INR 1.2   Cardiac Enzymes: Recent Labs  Lab 01/19/23 1221  CKTOTAL 54   BNP (last 3 results) Recent Labs     01/19/23 1116 01/21/23 0710  BNP 400.4* 335.8*   Thyroid Function Tests: Recent Labs    01/19/23 1650  TSH 1.157  FREET4 1.30*   Sepsis Labs: Recent Labs  Lab 01/19/23 1410 01/19/23 1658 01/21/23 0710  PROCALCITON 0.55  --  0.31  LATICACIDVEN  --  2.2*  --    Pneumonia Antigens Lab Results  Component Value Date/Time   [facility] NEGATIVE 01/19/2023 12:21 PM     Recent Results (from the past 240 hours)  Resp panel by RT-PCR (RSV, Flu A&B, Covid) Anterior Nasal Swab     Status: None   Collection Time: 01/19/23 12:21 PM   Specimen: Anterior Nasal Swab  Result Value Ref Range Status   SARS Coronavirus 2 by RT PCR NEGATIVE NEGATIVE Final   Influenza A by PCR NEGATIVE NEGATIVE Final   Influenza B by PCR NEGATIVE NEGATIVE Final    Comment: (NOTE) The Xpert Xpress SARS-CoV-2/FLU/RSV plus assay is intended as an aid in the diagnosis of influenza from Nasopharyngeal swab specimens and should not be used as a sole basis for treatment. Nasal washings and aspirates are unacceptable for Xpert Xpress SARS-CoV-2/FLU/RSV testing.  Fact Sheet for Patients: BloggerCourse.com  Fact Sheet for Healthcare Providers: SeriousBroker.it  This test is not yet approved or cleared by the [place] FDA and has been authorized for detection and/or diagnosis of SARS-CoV-2 by FDA under an Emergency Use Authorization (EUA). This EUA will remain in effect (meaning this test can be used) for the duration of the COVID-19 declaration under Section 564(b)(1) of the Act, 21 U.S.C. section 360bbb-3(b)(1), unless the authorization is terminated or revoked.     Resp Syncytial Virus by PCR NEGATIVE NEGATIVE Final    Comment: (NOTE) Fact Sheet for Patients: BloggerCourse.com  Fact Sheet for Healthcare Providers: SeriousBroker.it  This test is not yet approved or cleared by the [place] FDA  and has been authorized for detection and/or diagnosis of SARS-CoV-2 by FDA under an Emergency Use Authorization (EUA). This EUA will remain in effect (meaning this test can be used) for the duration of the COVID-19 declaration under Section 564(b)(1) of the Act, 21 U.S.C. section 360bbb-3(b)(1), unless the authorization is terminated or revoked.  Performed at [facility], [address], [place], [place] [address]   Blood Culture (routine x 2)  Status: None (Preliminary result)   Collection Time: 01/19/23  4:49 PM   Specimen: BLOOD RIGHT FOREARM  Result Value Ref Range Status   Specimen Description BLOOD RIGHT FOREARM  Final   Special Requests   Final    BOTTLES DRAWN AEROBIC AND ANAEROBIC Blood Culture adequate volume   Culture   Final    NO GROWTH < 24 HOURS Performed at [facility], [address], [place], [place] [address]    Report Status PENDING  Incomplete  Blood Culture (routine x 2)     Status: None (Preliminary result)   Collection Time: 01/19/23  4:50 PM   Specimen: BLOOD RIGHT ARM  Result Value Ref Range Status   Specimen Description BLOOD RIGHT ARM  Final   Special Requests   Final    BOTTLES DRAWN AEROBIC AND ANAEROBIC Blood Culture adequate volume   Culture   Final    NO GROWTH < 24 HOURS Performed at [facility], [address] [place], [place] [address]    Report Status PENDING  Incomplete  Respiratory (~20 pathogens) panel by PCR     Status: None   Collection Time: 01/19/23  5:24 PM   Specimen: Nasopharyngeal Swab; Respiratory  Result Value Ref Range Status   Adenovirus NOT DETECTED NOT DETECTED Final   Coronavirus 229E NOT DETECTED NOT DETECTED Final    Comment: (NOTE) The Coronavirus on the Respiratory Panel, DOES NOT test for the novel  Coronavirus (2019 nCoV)    Coronavirus HKU1 NOT DETECTED NOT DETECTED Final   Coronavirus NL63 NOT DETECTED NOT DETECTED Final   Coronavirus OC43 NOT DETECTED NOT DETECTED Final    Metapneumovirus NOT DETECTED NOT DETECTED Final   Rhinovirus / Enterovirus NOT DETECTED NOT DETECTED Final   Influenza A NOT DETECTED NOT DETECTED Final   Influenza B NOT DETECTED NOT DETECTED Final   Parainfluenza Virus 1 NOT DETECTED NOT DETECTED Final   Parainfluenza Virus 2 NOT DETECTED NOT DETECTED Final   Parainfluenza Virus 3 NOT DETECTED NOT DETECTED Final   Parainfluenza Virus 4 NOT DETECTED NOT DETECTED Final   Respiratory Syncytial Virus NOT DETECTED NOT DETECTED Final   Bordetella pertussis NOT DETECTED NOT DETECTED Final   Bordetella Parapertussis NOT DETECTED NOT DETECTED Final   Chlamydophila pneumoniae NOT DETECTED NOT DETECTED Final   Mycoplasma pneumoniae NOT DETECTED NOT DETECTED Final    Comment: Performed at [facility], [address] [place], [place] [address]  MRSA Next Gen by PCR, Nasal     Status: None   Collection Time: 01/19/23  5:59 PM   Specimen: Nasal Mucosa; Nasal Swab  Result Value Ref Range Status   MRSA by PCR Next Gen NOT DETECTED NOT DETECTED Final    Comment: (NOTE) The GeneXpert MRSA Assay (FDA approved for NASAL specimens only), is one component of a comprehensive MRSA colonization surveillance program. It is not intended to diagnose MRSA infection nor to guide or monitor treatment for MRSA infections. Test performance is not FDA approved in patients less than 43 years old. Performed at [facility], [address], [place], [place] [address]   C Difficile Quick Screen w PCR reflex     Status: None   Collection Time: 01/20/23  3:46 AM   Specimen: STOOL  Result Value Ref Range Status   C Diff antigen NEGATIVE NEGATIVE Final   C Diff toxin NEGATIVE NEGATIVE Final   C Diff interpretation No C. difficile detected.  Final  Comment: Performed at [facility], [address], [place], [place] [address]     Radiology Studies: EEG adult Result Date: 01/20/2023 Charlsie Quest, MD     01/20/2023  1:10 PM Patient Name:  Nareg Achuff MRN: [id number] Epilepsy Attending: Charlsie Quest Referring Physician/Provider: Clydie Braun, MD Date: 01/20/2023 Duration: 24.04 mins Patient history: 77yo M with ams getting eeg to evaluate for seizure Level of alertness: Awake AEDs during EEG study: None Technical aspects: This EEG study was done with scalp electrodes positioned according to the 10-20 International system of electrode placement. Electrical activity was reviewed with band pass filter of 1-70Hz , sensitivity of 7 uV/mm, display speed of 24mm/sec with a 60Hz  notched filter applied as appropriate. EEG data were recorded continuously and digitally stored.  Video monitoring was available and reviewed as appropriate. Description: The posterior dominant rhythm consists of 7.5 Hz activity of moderate voltage (25-35 uV) seen predominantly in posterior head regions, symmetric and reactive to eye opening and eye closing. EEG showed continuous 3 to 6 Hz theta-delta slowing with overriding 12-15hz  beta activity in right fronto-temporal region. Hyperventilation did not show any EEG change consistent with breach artifact. Physiologic photic driving was seen during photic stimulation.  ABNORMALITY - Breach artifact, right frontotemporal region IMPRESSION: This study is suggestive of cortical dysfunction arising from right frontotemporal region consistent with underlying craniotomy. No seizures or epileptiform discharges were seen throughout the recording. Charlsie Quest   DG Chest Port 1 View Result Date: 01/19/2023 CLINICAL DATA:  Weakness. EXAM: PORTABLE CHEST 1 VIEW COMPARISON:  April 01, 2020. FINDINGS: The heart size and mediastinal contours are within normal limits. Mild bibasilar opacities are noted concerning for subsegmental atelectasis or possibly infiltrates, left greater than right. The visualized skeletal structures are unremarkable. IMPRESSION: Mild bibasilar subsegmental atelectasis or infiltrates as noted above.  Electronically Signed   By: Lupita Raider M.D.   On: 01/19/2023 14:51    Scheduled Meds:  azithromycin  500 mg Oral Daily   calcium carbonate  1 tablet Oral Q supper   clopidogrel  75 mg Oral Q breakfast   DULoxetine  30 mg Oral Daily   enoxaparin (LOVENOX) injection  40 mg Subcutaneous Q24H   finasteride  5 mg Oral Daily   guaiFENesin  600 mg Oral BID   levothyroxine  88 mcg Oral Q0600   pantoprazole  40 mg Oral Daily   predniSONE  5 mg Oral Q breakfast   simvastatin  20 mg Oral QHS   sodium chloride flush  3 mL Intravenous Q12H   sucralfate  1 g Oral q AM   tamsulosin  0.4 mg Oral q AM   Continuous Infusions:  cefTRIAXone (ROCEPHIN)  IV 2 g (01/20/23 1158)     LOS: 2 days   Time spent: 40 minutes  Carollee Herter, DO  [facility]  01/21/2023, 10:45 AM

## 2023-01-21 NOTE — Assessment & Plan Note (Signed)
Give 40 meq po kcl prior to DC.

## 2023-01-22 ENCOUNTER — Telehealth: Payer: Self-pay

## 2023-01-22 LAB — LEGIONELLA PNEUMOPHILA SEROGP 1 UR AG: L. pneumophila Serogp 1 Ur Ag: NEGATIVE

## 2023-01-22 NOTE — Transitions of Care (Post Inpatient/ED Visit) (Signed)
01/22/2023  Name: Maxwell Fox MRN: [id number] DOB: 03-Apr-1945  Today's TOC FU Call Status: Today's TOC FU Call Status:: Successful TOC FU Call Completed TOC FU Call Complete Date: 01/22/23 Patient's Name and Date of Birth confirmed.  Transition Care Management Follow-up Telephone Call Date of Discharge: 01/21/23 Discharge Facility: [facility]) Type of Discharge: Inpatient Admission Primary Inpatient Discharge Diagnosis:: Pneumonia How have you been since you were released from the hospital?: Same (The doctor said it would be a week before I really felt good) Any questions or concerns?: No  Items Reviewed: Did you receive and understand the discharge instructions provided?: Yes Medications obtained,verified, and reconciled?: Yes (Medications Reviewed) Any new allergies since your discharge?: No Dietary orders reviewed?: Yes Type of Diet Ordered:: Low sodium, carb healthy Do you have support at home?: Yes People in Home: spouse Name of Support/Comfort Primary Source: Maxwell Fox  Medications Reviewed Today: Medications Reviewed Today     Reviewed by [facility] Gainer, RN (Case Manager) on 01/22/23 at 1404  Med List Status: <None>   Medication Order Taking? Sig Documenting Provider Last Dose Status Informant  acetaminophen (TYLENOL) 325 MG tablet [id number] No Take 2 tablets (650 mg total) by mouth every 6 (six) hours as needed for moderate pain. Arrien, York Ram, MD Past Week Active Self, Pharmacy Records  aspirin 81 MG chewable tablet [id number] No Chew 1 tablet (81 mg total) by mouth daily. [name], MD Past Week Active Self, Pharmacy Records  azithromycin [facility]) 500 MG tablet [id number]  Take 1 tablet (500 mg total) by mouth daily for 4 days. Carollee Herter, DO  Active   calcium carbonate (OS-CAL - DOSED IN MG OF ELEMENTAL CALCIUM) 1250 (500 Ca) MG tablet [id number] No Take 1 tablet by mouth daily with supper. [provider] Past Week  Active Self, Pharmacy Records  cefadroxil (DURICEF) 500 MG capsule [id number]  Take 1 capsule (500 mg total) by mouth 2 (two) times daily for 5 days. Carollee Herter, DO  Active   Cholecalciferol (VITAMIN D) 2000 UNITS tablet [id number] No Take 1 tablet (2,000 Units total) by mouth daily. Newt Lukes, MD Past Week Active Self, Pharmacy Records  clonazePAM [facility]) 1 MG tablet [id number] No Take 1 tablet (1 mg total) by mouth 2 (two) times daily. Pincus Sanes, MD Past Week Active Self, Pharmacy Records  clopidogrel (PLAVIX) 75 MG tablet [id number] No Take 1 tablet (75 mg total) by mouth daily with breakfast. Swinyer, Zachary George, NP Past Week Active Self, Pharmacy Records  denosumab [facility]) injection 60 mg [id number]   Carlus Pavlov, MD  Active   DULoxetine (CYMBALTA) 30 MG capsule [id number] No Take 1 capsule (30 mg total) by mouth daily. Pincus Sanes, MD Past Week Active Self, Pharmacy Records  finasteride (PROSCAR) 5 MG tablet [id number] No TAKE 1 TABLET(5 MG) BY MOUTH DAILY Burns, Bobette Mo, MD Past Week Active Self, Pharmacy Records  levothyroxine (SYNTHROID) 88 MCG tablet [id number] No TAKE 1 TABLET BY MOUTH EVERY DAY BEFORE Bari Mantis, MD Past Week Active Self, Pharmacy Records  Multiple Vitamin (MULTIVITAMIN WITH MINERALS) TABS tablet [id number] No Take 1 tablet by mouth in the morning. [provider] Past Week Active Self, Pharmacy Records  ondansetron (ZOFRAN) 4 MG tablet [id number] No TAKE 1 TABLET(4 MG) BY MOUTH EVERY 8 HOURS AS NEEDED FOR NAUSEA OR VOMITING  Patient not taking: Reported on 01/21/2023   Pincus Sanes, MD Not Taking Active Self, Pharmacy Records  ondansetron [facility]) 4 MG tablet [id number]  Take 1 tablet (4 mg total) by mouth daily as needed for nausea or vomiting. Carollee Herter, DO  Active   oxyCODONE (ROXICODONE) 5 MG immediate release tablet [id number] No Take 1 tablet (5 mg total) by mouth every 8 (eight) hours as needed for severe pain (pain score  7-10) (chronic back pain). MDD 3 Burns, Bobette Mo, MD Past Week Active Self, Pharmacy Records  pantoprazole (PROTONIX) 40 MG tablet [id number] No Take 1 tablet (40 mg total) by mouth daily. Please follow up with primary care provider for additional refills. Swinyer, Zachary George, NP Past Week Active Self, Pharmacy Records  predniSONE (DELTASONE) 5 MG tablet [id number] No TAKE 1 TABLET BY MOUTH EVERY DAY WITH Bari Mantis, MD Past Week Active Self, Pharmacy Records  simvastatin (ZOCOR) 20 MG tablet [id number] No Take 1 tablet (20 mg total) by mouth at bedtime. [name], MD Past Week Active Self, Pharmacy Records  tamsulosin [facility]) 0.4 MG CAPS capsule [id number] No TAKE 1 CAPSULE BY MOUTH EVERY DAY AFTER SUPPER  Patient taking differently: Take 0.4 mg by mouth in the morning.   Pincus Sanes, MD Past Week Active Self, Pharmacy Records            Home Care and Equipment/Supplies: Were Home Health Services Ordered?: No Any new equipment or medical supplies ordered?: NA  Functional Questionnaire: Do you need assistance with bathing/showering or dressing?: No Do you need assistance with meal preparation?: No Do you need assistance with eating?: No Do you have difficulty maintaining continence: No Do you need assistance with getting out of bed/getting out of a chair/moving?: No Do you have difficulty managing or taking your medications?: No  Follow up appointments reviewed: PCP Follow-up appointment confirmed?: NA (The patient declined. He states he will call if he thinks he needs it) Specialist Hospital Follow-up appointment confirmed?: NA Do you need transportation to your follow-up appointment?: No Do you understand care options if your condition(s) worsen?: Yes-patient verbalized understanding  SDOH Interventions Today    Flowsheet Row Most Recent Value  SDOH Interventions   Food Insecurity Interventions Intervention Not Indicated  Housing Interventions  Intervention Not Indicated  Transportation Interventions Intervention Not Indicated  Utilities Interventions Intervention Not Indicated       Interventions Today    Flowsheet Row Most Recent Value  General Interventions   General Interventions Discussed/Reviewed Sick Day Rules, Doctor Visits  Doctor Visits Discussed/Reviewed Doctor Visits Reviewed  Exercise Interventions   Exercise Discussed/Reviewed Physical Activity  Physical Activity Discussed/Reviewed Physical Activity Reviewed  Education Interventions   Education Provided Provided Education  Provided Verbal Education On Exercise, Medication, When to see the doctor  Pharmacy Interventions   Pharmacy Dicussed/Reviewed Medications and their functions       TOC Outreach to the patient who was in the hospital for two days and diagnosed with Pneumonia. He was discharged home on two antibiotics. He does not require oxygen. Declines outpatient or HHPT. He states he was told by the provider that his pneumonia will take some time to get better and that he needed to rest. RNCM did encourage minimal activity to prevent the pneumonia from getting worse. He declines a PCP follow up appointment at this time.  The patient has been provided with contact information for the care management team and has been advised to call with any health-related questions or concerns. The patient verbalized understanding with current POC. The patient is directed to their insurance card regarding availability of benefits coverage.   Deidre Ala, RN  RN Care Manager [facility] [phone]

## 2023-01-24 LAB — CULTURE, BLOOD (ROUTINE X 2)
Culture: NO GROWTH
Culture: NO GROWTH
Special Requests: ADEQUATE
Special Requests: ADEQUATE

## 2023-01-29 NOTE — Telephone Encounter (Signed)
Call to patient to attempt to check on symptoms as he mentioned in a MyChart message that he had blue lips but did not specify when. No answer, left detailed message per DPR asking patient to Check MyChart message or call our office to clarify if he is having blue lips now. Also sent Mychart message advising patient that if he is experiencing blue lips now, especially if he is having difficulty breathing he needs to call 911. Unfortunately there is not a different # listed for wife who is listed on DPR as someone we can give his health care info to. Also attempted to cal Mobile # ending in [phone] but it went straight to VM and VM is not set up.

## 2023-02-05 ENCOUNTER — Telehealth: Payer: Self-pay | Admitting: Cardiology

## 2023-02-05 ENCOUNTER — Other Ambulatory Visit: Payer: Self-pay

## 2023-02-05 ENCOUNTER — Encounter: Payer: Self-pay | Admitting: Internal Medicine

## 2023-02-05 ENCOUNTER — Other Ambulatory Visit: Payer: Medicare Other

## 2023-02-05 DIAGNOSIS — I2510 Atherosclerotic heart disease of native coronary artery without angina pectoris: Secondary | ICD-10-CM

## 2023-02-05 DIAGNOSIS — E875 Hyperkalemia: Secondary | ICD-10-CM

## 2023-02-05 LAB — LIPID PANEL
Chol/HDL Ratio: 2.2 {ratio} (ref 0.0–5.0)
Cholesterol, Total: 165 mg/dL (ref 100–199)
HDL: 74 mg/dL (ref >39)
LDL Chol Calc (NIH): 67 mg/dL (ref 0–99)
Triglycerides: 140 mg/dL (ref 0–149)
VLDL Cholesterol Cal: 24 mg/dL (ref 5–40)

## 2023-02-05 LAB — HEPATIC FUNCTION PANEL
ALT: 28 [IU]/L (ref 0–44)
AST: 38 [IU]/L (ref 0–40)
Albumin: 3.6 g/dL — ABNORMAL LOW (ref 3.8–4.8)
Alkaline Phosphatase: 71 [IU]/L (ref 44–121)
Bilirubin Total: 0.3 mg/dL (ref 0.0–1.2)
Bilirubin, Direct: 0.15 mg/dL (ref 0.00–0.40)
Total Protein: 5.9 g/dL — ABNORMAL LOW (ref 6.0–8.5)

## 2023-02-05 LAB — BASIC METABOLIC PANEL
BUN/Creatinine Ratio: 15 (ref 10–24)
BUN: 15 mg/dL (ref 8–27)
CO2: 21 mmol/L (ref 20–29)
Calcium: 8.6 mg/dL (ref 8.6–10.2)
Chloride: 106 mmol/L (ref 96–106)
Creatinine, Ser: 1 mg/dL (ref 0.76–1.27)
Glucose: 81 mg/dL (ref 70–99)
Potassium: 5.8 mmol/L (ref 3.5–5.2)
Sodium: 138 mmol/L (ref 134–144)
eGFR: 78 mL/min/{1.73_m2} (ref >59)

## 2023-02-05 NOTE — Telephone Encounter (Signed)
 Notified by [facility] that patient had a critical lab result. On blood work collected 02/05/23, potassium was elevated to 5.8.   Per review of chart, does not appear that patient is currently on any medications that would cause increase in potassium levels.   Called patient to discuss lab results. Patient did not answer his home phone number. Left a voice mail asking him to call our office number to reach on call provider. I will also forward this message to on-call APP for follow up tomorrow AM.   Rollo FABIENE Louder, PA-C 02/05/2023 8:52 PM

## 2023-02-06 ENCOUNTER — Other Ambulatory Visit: Payer: Self-pay | Admitting: Student

## 2023-02-06 DIAGNOSIS — Z79899 Other long term (current) drug therapy: Secondary | ICD-10-CM

## 2023-02-06 NOTE — Telephone Encounter (Signed)
   Contacted the patient in regards to his high potassium. Reports he was treated for hypokalemia during his recent admission and by review of notes, his K+ had been low at 3.1 and this was replaced. We reviewed that his repeat potassium could be due to lab error but we need to recheck this to confirm. Placed lab orders for the outpatient lab here at [facility] and called to confirm they could check this at any point over the weekend. The patient says he can absolutely not come for lab work until Monday. We reviewed the risks of hyperkalemia. He plans to present to [facility] on Monday morning and orders were already placed for this as well. Encouraged him to limit intake of K+ rich foods over the weekend if he cannot have labs until Monday.   Signed, Laymon CHRISTELLA Qua, PA-C 02/06/2023, 9:18 AM

## 2023-02-08 DIAGNOSIS — I340 Nonrheumatic mitral (valve) insufficiency: Secondary | ICD-10-CM | POA: Diagnosis not present

## 2023-02-09 LAB — BASIC METABOLIC PANEL
BUN/Creatinine Ratio: 11 (ref 10–24)
BUN: 16 mg/dL (ref 8–27)
CO2: 22 mmol/L (ref 20–29)
Calcium: 9.1 mg/dL (ref 8.6–10.2)
Chloride: 109 mmol/L — ABNORMAL HIGH (ref 96–106)
Creatinine, Ser: 1.47 mg/dL — ABNORMAL HIGH (ref 0.76–1.27)
Glucose: 99 mg/dL (ref 70–99)
Potassium: 5.5 mmol/L — ABNORMAL HIGH (ref 3.5–5.2)
Sodium: 142 mmol/L (ref 134–144)
eGFR: 49 mL/min/{1.73_m2} — ABNORMAL LOW (ref >59)

## 2023-03-22 ENCOUNTER — Encounter: Payer: Self-pay | Admitting: Internal Medicine

## 2023-03-22 NOTE — Progress Notes (Unsigned)
 Subjective:    Patient ID: Maxwell Fox, male    DOB: 1945/05/03, 78 y.o.   MRN: [id number]     HPI Aleksander is here for follow up of his chronic medical problems.  Overall doing well-no concerns.  Since he was here last he did stop the narcotics.  He was in the hospital for pneumonia and he decided at that time to stop them completely and being done fine.  He did not have any withdrawal but he denies pain.  Medications and allergies reviewed with patient and updated if appropriate.  Current Outpatient Medications on File Prior to Visit  Medication Sig Dispense Refill   acetaminophen (TYLENOL) 325 MG tablet Take 2 tablets (650 mg total) by mouth every 6 (six) hours as needed for moderate pain.     aspirin 81 MG chewable tablet Chew 1 tablet (81 mg total) by mouth daily. 90 tablet 3   calcium carbonate (OS-CAL - DOSED IN MG OF ELEMENTAL CALCIUM) 1250 (500 Ca) MG tablet Take 1 tablet by mouth daily with supper.     Cholecalciferol (VITAMIN D) 2000 UNITS tablet Take 1 tablet (2,000 Units total) by mouth daily. 30 tablet 11   clonazePAM (KLONOPIN) 1 MG tablet Take 1 tablet (1 mg total) by mouth 2 (two) times daily. 180 tablet 1   clopidogrel (PLAVIX) 75 MG tablet Take 1 tablet (75 mg total) by mouth daily with breakfast. 90 tablet 3   DULoxetine (CYMBALTA) 30 MG capsule Take 1 capsule (30 mg total) by mouth daily. 90 capsule 1   finasteride (PROSCAR) 5 MG tablet TAKE 1 TABLET(5 MG) BY MOUTH DAILY 90 tablet 1   levothyroxine (SYNTHROID) 88 MCG tablet TAKE 1 TABLET BY MOUTH EVERY DAY BEFORE BREAKFAST 90 tablet 3   Multiple Vitamin (MULTIVITAMIN WITH MINERALS) TABS tablet Take 1 tablet by mouth in the morning.     ondansetron (ZOFRAN) 4 MG tablet Take 1 tablet (4 mg total) by mouth daily as needed for nausea or vomiting. 30 tablet 0   pantoprazole (PROTONIX) 40 MG tablet Take 1 tablet (40 mg total) by mouth daily. Please follow up with primary care provider for additional refills. 90  tablet 3   predniSONE (DELTASONE) 5 MG tablet TAKE 1 TABLET BY MOUTH EVERY DAY WITH BREAKFAST 90 tablet 3   simvastatin (ZOCOR) 20 MG tablet Take 1 tablet (20 mg total) by mouth at bedtime. 90 tablet 3   tamsulosin (FLOMAX) 0.4 MG CAPS capsule TAKE 1 CAPSULE BY MOUTH EVERY DAY AFTER SUPPER (Patient taking differently: Take 0.4 mg by mouth in the morning.) 90 capsule 1   Current Facility-Administered Medications on File Prior to Visit  Medication Dose Route Frequency Provider Last Rate Last Admin   [START ON 06/28/2023] denosumab (PROLIA) injection 60 mg  60 mg Subcutaneous Once Carlus Pavlov, MD         Review of Systems  Constitutional:  Negative for appetite change.  Respiratory:  Negative for cough, shortness of breath and wheezing.   Cardiovascular:  Negative for chest pain, palpitations and leg swelling.  Genitourinary:  Negative for difficulty urinating.  Neurological:  Negative for light-headedness and headaches.  Psychiatric/Behavioral:  Positive for dysphoric mood (controlled). Negative for sleep disturbance. The patient is nervous/anxious (controlled).        Objective:   Vitals:   03/23/23 0802  BP: 110/74  Pulse: 63  Temp: 98 F (36.7 C)  SpO2: 97%   BP Readings from Last 3 Encounters:  03/23/23 110/74  01/21/23 122/68  12/29/22 122/70   Wt Readings from Last 3 Encounters:  03/23/23 197 lb (89.4 kg)  01/20/23 193 lb 9 oz (87.8 kg)  12/29/22 188 lb 9.6 oz (85.5 kg)   Body mass index is 27.48 kg/m.    Physical Exam Constitutional:      General: He is not in acute distress.    Appearance: Normal appearance. He is not ill-appearing.  HENT:     Head: Normocephalic and atraumatic.  Eyes:     Conjunctiva/sclera: Conjunctivae normal.  Cardiovascular:     Rate and Rhythm: Normal rate and regular rhythm.     Heart sounds: Normal heart sounds.  Pulmonary:     Effort: Pulmonary effort is normal. No respiratory distress.     Breath sounds: Normal breath  sounds. No wheezing or rales.  Musculoskeletal:     Right lower leg: Edema (1 + pitting edema) present.     Left lower leg: Edema (1 + pitting edema) present.  Skin:    General: Skin is warm and dry.     Findings: No rash.  Neurological:     Mental Status: He is alert. Mental status is at baseline.  Psychiatric:        Mood and Affect: Mood normal.        Lab Results  Component Value Date   WBC 11.6 (H) 01/21/2023   HGB 11.3 (L) 01/21/2023   HCT 33.8 (L) 01/21/2023   PLT 164 01/21/2023   GLUCOSE 99 02/08/2023   CHOL 165 02/05/2023   TRIG 140 02/05/2023   HDL 74 02/05/2023   LDLCALC 67 02/05/2023   ALT 28 02/05/2023   AST 38 02/05/2023   NA 142 02/08/2023   K 5.5 (H) 02/08/2023   CL 109 (H) 02/08/2023   CREATININE 1.47 (H) 02/08/2023   BUN 16 02/08/2023   CO2 22 02/08/2023   TSH 1.157 01/19/2023   INR 1.2 01/19/2023   HGBA1C 4.7 (L) 02/14/2020     Assessment & Plan:    See Problem List for Assessment and Plan of chronic medical problems.

## 2023-03-23 ENCOUNTER — Ambulatory Visit (INDEPENDENT_AMBULATORY_CARE_PROVIDER_SITE_OTHER): Payer: Medicare Other | Admitting: Internal Medicine

## 2023-03-23 ENCOUNTER — Encounter: Payer: Self-pay | Admitting: Internal Medicine

## 2023-03-23 VITALS — BP 110/74 | HR 63 | Temp 98.0°F | Ht 71.0 in | Wt 197.0 lb

## 2023-03-23 DIAGNOSIS — N401 Enlarged prostate with lower urinary tract symptoms: Secondary | ICD-10-CM

## 2023-03-23 DIAGNOSIS — R350 Frequency of micturition: Secondary | ICD-10-CM

## 2023-03-23 DIAGNOSIS — E876 Hypokalemia: Secondary | ICD-10-CM

## 2023-03-23 DIAGNOSIS — F32A Depression, unspecified: Secondary | ICD-10-CM | POA: Diagnosis not present

## 2023-03-23 DIAGNOSIS — G894 Chronic pain syndrome: Secondary | ICD-10-CM | POA: Diagnosis not present

## 2023-03-23 DIAGNOSIS — F419 Anxiety disorder, unspecified: Secondary | ICD-10-CM

## 2023-03-23 LAB — BASIC METABOLIC PANEL
BUN: 10 mg/dL (ref 6–23)
CO2: 23 meq/L (ref 19–32)
Calcium: 8.3 mg/dL — ABNORMAL LOW (ref 8.4–10.5)
Chloride: 109 meq/L (ref 96–112)
Creatinine, Ser: 0.85 mg/dL (ref 0.40–1.50)
GFR: 83.54 mL/min (ref >60.00)
Glucose, Bld: 89 mg/dL (ref 70–99)
Potassium: 4.2 meq/L (ref 3.5–5.1)
Sodium: 140 meq/L (ref 135–145)

## 2023-03-23 NOTE — Assessment & Plan Note (Addendum)
 Resolved Weaned himself off the oxycodone since his last visit Doing well - denies pain - feels good

## 2023-03-23 NOTE — Assessment & Plan Note (Signed)
 New Had a couple instances of elevated potassium and he is concerned about that Has been eating low potassium foods BMP today

## 2023-03-23 NOTE — Assessment & Plan Note (Signed)
Chronic Symptoms controlled-feels like he is emptying his bladder Continue Flomax 0.4 mg daily, proscar 5 mg daily

## 2023-03-23 NOTE — Assessment & Plan Note (Signed)
Chronic Anxious and depressed at times Continue clonazepam 1 mg twice daily Continue Cymbalta 30 mg daily-could consider increasing

## 2023-03-23 NOTE — Patient Instructions (Addendum)
      Blood work was ordered.       Medications changes include :   None      Return in about 6 months (around 09/20/2023) for follow up.

## 2023-04-01 ENCOUNTER — Other Ambulatory Visit: Payer: Self-pay | Admitting: Internal Medicine

## 2023-05-19 NOTE — Telephone Encounter (Signed)
 Prolia VOB initiated via [email or web address]  Next Prolia inj DUE: 06/29/23

## 2023-05-22 ENCOUNTER — Other Ambulatory Visit: Payer: Self-pay | Admitting: Internal Medicine

## 2023-05-25 ENCOUNTER — Other Ambulatory Visit: Payer: Self-pay | Admitting: Internal Medicine

## 2023-06-06 NOTE — Telephone Encounter (Signed)
 Patient is ready for scheduling on or after 06/29/23 BUY AND BILL  Out-of-pocket cost due at time of visit: $356.87  Primary: [place] Medicare Prolia  co-insurance: 20% (approximately $331.87) Admin fee co-insurance: 20% (approximately $25)  Deductible: $134.25 of $257 met  Prior Auth: NOT required  Secondary: AARP Medicare Supplement Plan F Prolia  co-insurance: Covers Medicare Part B co-insurance Admin fee co-insurance: Covers Medicare Part B co-insurance  Deductible:  Covered by secondary  Prior Auth: NOT required PA# Valid:   ** This summary of benefits is an estimation of the patient's out-of-pocket cost. Exact cost may vary based on individual plan coverage.

## 2023-06-06 NOTE — Telephone Encounter (Signed)
 Prior Authorization NOT required for [facility]

## 2023-06-08 NOTE — Telephone Encounter (Signed)
 LMx1 to call office to schedule Prolia injection.

## 2023-06-11 NOTE — Telephone Encounter (Signed)
 LMx2 to schedule Proia injection.

## 2023-06-14 DIAGNOSIS — Z08 Encounter for follow-up examination after completed treatment for malignant neoplasm: Secondary | ICD-10-CM | POA: Diagnosis not present

## 2023-06-14 DIAGNOSIS — Z9889 Other specified postprocedural states: Secondary | ICD-10-CM | POA: Diagnosis not present

## 2023-06-14 DIAGNOSIS — E2740 Unspecified adrenocortical insufficiency: Secondary | ICD-10-CM | POA: Diagnosis not present

## 2023-06-14 DIAGNOSIS — E039 Hypothyroidism, unspecified: Secondary | ICD-10-CM | POA: Diagnosis not present

## 2023-06-14 DIAGNOSIS — R7989 Other specified abnormal findings of blood chemistry: Secondary | ICD-10-CM | POA: Diagnosis not present

## 2023-06-14 DIAGNOSIS — Z923 Personal history of irradiation: Secondary | ICD-10-CM | POA: Diagnosis not present

## 2023-06-14 DIAGNOSIS — Z87891 Personal history of nicotine dependence: Secondary | ICD-10-CM | POA: Diagnosis not present

## 2023-06-14 DIAGNOSIS — C7931 Secondary malignant neoplasm of brain: Secondary | ICD-10-CM | POA: Diagnosis not present

## 2023-06-14 DIAGNOSIS — H209 Unspecified iridocyclitis: Secondary | ICD-10-CM | POA: Diagnosis not present

## 2023-06-14 DIAGNOSIS — M2550 Pain in unspecified joint: Secondary | ICD-10-CM | POA: Diagnosis not present

## 2023-06-14 DIAGNOSIS — G8929 Other chronic pain: Secondary | ICD-10-CM | POA: Diagnosis not present

## 2023-06-14 DIAGNOSIS — C439 Malignant melanoma of skin, unspecified: Secondary | ICD-10-CM | POA: Diagnosis not present

## 2023-06-14 DIAGNOSIS — Z85841 Personal history of malignant neoplasm of brain: Secondary | ICD-10-CM | POA: Diagnosis not present

## 2023-06-14 DIAGNOSIS — Z85820 Personal history of malignant melanoma of skin: Secondary | ICD-10-CM | POA: Diagnosis not present

## 2023-06-17 NOTE — Telephone Encounter (Signed)
 LMx3 to schedule Prolia  injection.

## 2023-06-22 ENCOUNTER — Encounter: Payer: Self-pay | Admitting: Internal Medicine

## 2023-06-23 NOTE — Telephone Encounter (Signed)
 Patient is ready for scheduling on or after 06/29/23 BUY AND BILL   Out-of-pocket cost due at time of visit: $0   Primary: [place] Medicare Prolia  co-insurance: 20% (approximately $331.87) Admin fee co-insurance: 20% (approximately $25)   Deductible: $134.25 of $257 met   Prior Auth: NOT required   Secondary: AARP Medicare Supplement Plan F Prolia  co-insurance: Covers Medicare Part B co-insurance Admin fee co-insurance: Covers Medicare Part B co-insurance   Deductible:  Covered by secondary   Prior Auth: NOT required PA# Valid:    ** This summary of benefits is an estimation of the patient's out-of-pocket cost. Exact cost may vary based on individual plan coverage.

## 2023-06-23 NOTE — Telephone Encounter (Signed)
 Good morning Brandy this patient is adamant that he does not have any out of pocket due for Prolia .  I tried to explain to him the reason, but he is still adamant.  Do you mind checking again about his out of pocket and then calling him.  Thank you so much.

## 2023-06-29 ENCOUNTER — Ambulatory Visit

## 2023-06-30 ENCOUNTER — Ambulatory Visit (INDEPENDENT_AMBULATORY_CARE_PROVIDER_SITE_OTHER)

## 2023-06-30 ENCOUNTER — Telehealth: Payer: Self-pay

## 2023-06-30 VITALS — BP 120/78 | HR 88 | Ht 71.0 in | Wt 202.0 lb

## 2023-06-30 DIAGNOSIS — M810 Age-related osteoporosis without current pathological fracture: Secondary | ICD-10-CM | POA: Diagnosis not present

## 2023-06-30 DIAGNOSIS — E038 Other specified hypothyroidism: Secondary | ICD-10-CM

## 2023-06-30 MED ORDER — DENOSUMAB 60 MG/ML ~~LOC~~ SOSY: 60.0000 mg | PREFILLED_SYRINGE | SUBCUTANEOUS | Status: AC

## 2023-06-30 NOTE — Telephone Encounter (Signed)
 I see that in Care Everywhere he had TSH of 0.3, mildly low.  He has central hypothyroidism, in that case TSH can be low and this does not guide to adjust the dose of his levothyroxine .  I would like to recheck TSH, free T4.  Dr. Aldona Amel has planned to check both TSH and free T4 when monitoring his thyroid  function test.  I have placed order for TSH, free T4.  Please arrange for lab visit.

## 2023-06-30 NOTE — Addendum Note (Signed)
 Addended by: Hall Levering on: 06/30/2023 01:43 PM   Modules accepted: Orders

## 2023-06-30 NOTE — Progress Notes (Signed)
After obtaining consent, and per orders of Dr. Cruzita Lederer, injection of Prolia '60mg'$   given by Katherinne Mofield L Digna Countess left arm SQ. Patient instructed to remain in clinic for 20 minutes afterwards, and to report any adverse reaction to me immediately.

## 2023-06-30 NOTE — Telephone Encounter (Signed)
 Patient had lab work done at [facility] on 06/14/23 that were abnormal. He was advised to contact his endocrinologist to have labs recheck to see if medication needs to be adjust. Patient states that he does feel a little off.  Dr. Aldona Amel is out of office and he would like labs repeated asap.

## 2023-06-30 NOTE — Telephone Encounter (Signed)
 I recommend to discuss with primary care provider to check those test.

## 2023-07-01 ENCOUNTER — Encounter: Payer: Self-pay | Admitting: Internal Medicine

## 2023-07-03 NOTE — Telephone Encounter (Signed)
his 

## 2023-07-04 NOTE — Telephone Encounter (Signed)
 Last Prolia  inj 06/30/23 Next Prolia  inj due 01/01/24

## 2023-07-05 ENCOUNTER — Other Ambulatory Visit

## 2023-07-05 DIAGNOSIS — E038 Other specified hypothyroidism: Secondary | ICD-10-CM | POA: Diagnosis not present

## 2023-07-06 ENCOUNTER — Ambulatory Visit: Payer: Self-pay | Admitting: Endocrinology

## 2023-07-06 DIAGNOSIS — L814 Other melanin hyperpigmentation: Secondary | ICD-10-CM | POA: Diagnosis not present

## 2023-07-06 DIAGNOSIS — D225 Melanocytic nevi of trunk: Secondary | ICD-10-CM | POA: Diagnosis not present

## 2023-07-06 DIAGNOSIS — Z08 Encounter for follow-up examination after completed treatment for malignant neoplasm: Secondary | ICD-10-CM | POA: Diagnosis not present

## 2023-07-06 DIAGNOSIS — L821 Other seborrheic keratosis: Secondary | ICD-10-CM | POA: Diagnosis not present

## 2023-07-06 DIAGNOSIS — Z85820 Personal history of malignant melanoma of skin: Secondary | ICD-10-CM | POA: Diagnosis not present

## 2023-07-06 LAB — TSH: TSH: 0.21 m[IU]/L — ABNORMAL LOW (ref 0.40–4.50)

## 2023-07-06 LAB — T4, FREE: Free T4: 1.4 ng/dL (ref 0.8–1.8)

## 2023-07-14 ENCOUNTER — Ambulatory Visit: Payer: Medicare Other

## 2023-07-16 DIAGNOSIS — C7931 Secondary malignant neoplasm of brain: Secondary | ICD-10-CM | POA: Diagnosis not present

## 2023-07-16 DIAGNOSIS — C801 Malignant (primary) neoplasm, unspecified: Secondary | ICD-10-CM | POA: Diagnosis not present

## 2023-07-20 ENCOUNTER — Other Ambulatory Visit: Payer: Self-pay | Admitting: Internal Medicine

## 2023-07-27 DIAGNOSIS — C7931 Secondary malignant neoplasm of brain: Secondary | ICD-10-CM | POA: Diagnosis not present

## 2023-08-18 ENCOUNTER — Ambulatory Visit (INDEPENDENT_AMBULATORY_CARE_PROVIDER_SITE_OTHER)

## 2023-08-18 VITALS — Ht 71.0 in | Wt 202.0 lb

## 2023-08-18 DIAGNOSIS — Z0000 Encounter for general adult medical examination without abnormal findings: Secondary | ICD-10-CM | POA: Diagnosis not present

## 2023-08-18 NOTE — Progress Notes (Signed)
 Subjective:   Maxwell Fox is a 78 y.o. who presents for a Medicare Wellness preventive visit.  As a reminder, Annual Wellness Visits don't include a physical exam, and some assessments may be limited, especially if this visit is performed virtually. We may recommend an in-person follow-up visit with your provider if needed.  Visit Complete: Virtual I connected with  Maxwell Fox on 08/18/23 by a audio enabled telemedicine application and verified that I am speaking with the correct person using two identifiers.  Patient Location: Home  Provider Location: Home Office  I discussed the limitations of evaluation and management by telemedicine. The patient expressed understanding and agreed to proceed.  Vital Signs: Because this visit was a virtual/telehealth visit, some criteria may be missing or patient reported. Any vitals not documented were not able to be obtained and vitals that have been documented are patient reported.  VideoDeclined- This patient declined Librarian, academic. Therefore the visit was completed with audio only.  Persons Participating in Visit: Patient.  AWV Questionnaire: Yes: Patient Medicare AWV questionnaire was completed by the patient on 08/17/2023; I have confirmed that all information answered by patient is correct and no changes since this date.        Objective:    Today's Vitals   08/18/23 1041  Weight: 202 lb (91.6 kg)  Height: 5' 11 (1.803 m)   Body mass index is 28.17 kg/m.     08/18/2023   10:59 AM 01/19/2023    5:07 PM 01/19/2023   11:17 AM 08/13/2022    7:07 AM 07/09/2022   12:33 PM 05/16/2022   11:07 PM 12/01/2021    4:21 PM  Advanced Directives  Does Patient Have a Medical Advance Directive? No No No No No No No  Would patient like information on creating a medical advance directive?  No - Patient declined  No - Patient declined   No - Patient declined    Current Medications (verified) Outpatient  Encounter Medications as of 08/18/2023  Medication Sig   acetaminophen  (TYLENOL ) 325 MG tablet Take 2 tablets (650 mg total) by mouth every 6 (six) hours as needed for moderate pain.   aspirin  81 MG chewable tablet Chew 1 tablet (81 mg total) by mouth daily.   calcium  carbonate (OS-CAL - DOSED IN MG OF ELEMENTAL CALCIUM ) 1250 (500 Ca) MG tablet Take 1 tablet by mouth daily with supper.   Cholecalciferol (VITAMIN D ) 2000 UNITS tablet Take 1 tablet (2,000 Units total) by mouth daily.   clonazePAM  (KLONOPIN ) 1 MG tablet TAKE 1 TABLET(1 MG) BY MOUTH TWICE DAILY   clopidogrel  (PLAVIX ) 75 MG tablet Take 1 tablet (75 mg total) by mouth daily with breakfast.   DULoxetine  (CYMBALTA ) 30 MG capsule Take 1 capsule (30 mg total) by mouth daily.   finasteride  (PROSCAR ) 5 MG tablet TAKE 1 TABLET(5 MG) BY MOUTH DAILY   levothyroxine  (SYNTHROID ) 88 MCG tablet TAKE 1 TABLET BY MOUTH EVERY DAY BEFORE BREAKFAST   Multiple Vitamin (MULTIVITAMIN WITH MINERALS) TABS tablet Take 1 tablet by mouth in the morning.   ondansetron  (ZOFRAN ) 4 MG tablet Take 1 tablet (4 mg total) by mouth daily as needed for nausea or vomiting.   pantoprazole  (PROTONIX ) 40 MG tablet Take 1 tablet (40 mg total) by mouth daily. Please follow up with primary care provider for additional refills.   predniSONE  (DELTASONE ) 5 MG tablet TAKE 1 TABLET BY MOUTH EVERY DAY WITH BREAKFAST   simvastatin  (ZOCOR ) 20 MG tablet Take 1 tablet (20  mg total) by mouth at bedtime.   tamsulosin  (FLOMAX ) 0.4 MG CAPS capsule TAKE 1 CAPSULE BY MOUTH EVERY DAY AFTER SUPPER   Facility-Administered Encounter Medications as of 08/18/2023  Medication   [START ON 12/27/2023] denosumab  (PROLIA ) injection 60 mg    Allergies (verified) Rosuvastatin    History: Past Medical History:  Diagnosis Date   B12 nutritional deficiency    malabsorbtion   Blood transfusion without reported diagnosis 2022   BPH (benign prostatic hypertrophy)    Brain cancer (HCC) 2017   removed  golf ball sized mass   Depression    on meds   Diverticulosis    GERD (gastroesophageal reflux disease)    on meds   GI bleeding 02/2020   Hypertension    Iron  deficiency anemia    malabsorption related (s/p gastric bypass)   Melanoma (HCC)    Obesity    s/p gastric bypass 2000, start 355#   Pancreatic cyst    Pulmonary nodules    Thyroid  disease    on meds   Past Surgical History:  Procedure Laterality Date   CHOLECYSTECTOMY  2000   COLONOSCOPY WITH PROPOFOL  N/A 03/05/2020   Procedure: COLONOSCOPY WITH PROPOFOL ;  Surgeon: Mansouraty, Aloha Raddle., MD;  Location: [facility] ENDOSCOPY;  Service: Gastroenterology;  Laterality: N/A;   CORONARY STENT INTERVENTION N/A 08/13/2022   Procedure: CORONARY STENT INTERVENTION;  Surgeon: Verlin Lonni BIRCH, MD;  Location: [facility] INVASIVE CV LAB;  Service: Cardiovascular;  Laterality: N/A;   CRANIOTOMY FOR TUMOR  12/11/2015   metastatic melanoma   ENTEROSCOPY N/A 03/30/2020   Procedure: ENTEROSCOPY;  Surgeon: Abran Norleen SAILOR, MD;  Location: [facility] ENDOSCOPY;  Service: Endoscopy;  Laterality: N/A;   ESOPHAGOGASTRODUODENOSCOPY N/A 03/30/2020   Procedure: ESOPHAGOGASTRODUODENOSCOPY (EGD);  Surgeon: Abran Norleen SAILOR, MD;  Location: [facility] ENDOSCOPY;  Service: Endoscopy;  Laterality: N/A;   ESOPHAGOGASTRODUODENOSCOPY (EGD) WITH PROPOFOL  N/A 02/14/2020   Procedure: ESOPHAGOGASTRODUODENOSCOPY (EGD) WITH PROPOFOL ;  Surgeon: San Sandor GAILS, DO;  Location: [facility] ENDOSCOPY;  Service: Endoscopy;  Laterality: N/A;   ESOPHAGOGASTRODUODENOSCOPY (EGD) WITH PROPOFOL  N/A 02/26/2020   Procedure: ESOPHAGOGASTRODUODENOSCOPY (EGD) WITH PROPOFOL ;  Surgeon: Abran Norleen SAILOR, MD;  Location: [facility] ENDOSCOPY;  Service: Endoscopy;  Laterality: N/A;   ESOPHAGOGASTRODUODENOSCOPY (EGD) WITH PROPOFOL  N/A 03/03/2020   Procedure: ESOPHAGOGASTRODUODENOSCOPY (EGD) WITH PROPOFOL ;  Surgeon: Rollin Dover, MD;  Location: [facility] ENDOSCOPY;  Service: Endoscopy;  Laterality: N/A;   ESOPHAGOGASTRODUODENOSCOPY (EGD) WITH  PROPOFOL  N/A 03/28/2020   Procedure: ESOPHAGOGASTRODUODENOSCOPY (EGD) WITH PROPOFOL ;  Surgeon: Abran Norleen SAILOR, MD;  Location: [facility] ENDOSCOPY;  Service: Endoscopy;  Laterality: N/A;   FOREIGN BODY REMOVAL  03/30/2020   Procedure: FOREIGN BODY REMOVAL;  Surgeon: Abran Norleen SAILOR, MD;  Location: [facility] ENDOSCOPY;  Service: Endoscopy;;   GIVENS CAPSULE STUDY N/A 03/28/2020   Procedure: GIVENS CAPSULE STUDY;  Surgeon: Abran Norleen SAILOR, MD;  Location: [facility] ENDOSCOPY;  Service: Endoscopy;  Laterality: N/A;   HEMOSTASIS CONTROL  02/26/2020   Procedure: HEMOSTASIS CONTROL;  Surgeon: Abran Norleen SAILOR, MD;  Location: [facility] ENDOSCOPY;  Service: Endoscopy;;   HEMOSTASIS CONTROL  03/30/2020   Procedure: HEMOSTASIS CONTROL;  Surgeon: Abran Norleen SAILOR, MD;  Location: [facility] ENDOSCOPY;  Service: Endoscopy;;   HOT HEMOSTASIS N/A 02/26/2020   Procedure: HOT HEMOSTASIS (ARGON PLASMA COAGULATION/BICAP);  Surgeon: Abran Norleen SAILOR, MD;  Location: [facility] ENDOSCOPY;  Service: Endoscopy;  Laterality: N/A;   MOHS SURGERY  2012   melanoma, mid back, [place]    RIGHT/LEFT HEART CATH AND CORONARY ANGIOGRAPHY N/A 08/13/2022   Procedure: RIGHT/LEFT HEART CATH AND CORONARY  ANGIOGRAPHY;  Surgeon: Verlin Lonni BIRCH, MD;  Location[facility] INVASIVE CV LAB;  Service: Cardiovascular;  Laterality: N/A;   ROUX-EN-Y PROCEDURE  2000   TOTAL SHOULDER REPLACEMENT Right 2011   [place]    WISDOM TOOTH EXTRACTION     Family History  Problem Relation Age of Onset   Hypertension Father    Macular degeneration Mother    Melanoma Mother    Melanoma Sister    Stomach cancer Sister 37       duodenal   Breast cancer Sister    Uterine cancer Sister    Melanoma Brother    Colon polyps Neg Hx    Colon cancer Neg Hx    Esophageal cancer Neg Hx    Rectal cancer Neg Hx    Social History   Socioeconomic History   Marital status: Married    Spouse name: Maxwell Fox   Number of children: 2   Years of education: Not on file   Highest education level: Some college, no degree   Occupational History   Occupation: general contractor-retired  Tobacco Use   Smoking status: Former    Current packs/day: 0.00    Types: Cigarettes    Quit date: 02/03/1963    Years since quitting: 60.5   Smokeless tobacco: Never  Vaping Use   Vaping status: Never Used  Substance and Sexual Activity   Alcohol use: Not Currently    Comment: wine 2x/week   Drug use: Never   Sexual activity: Not on file  Other Topics Concern   Not on file  Social History Narrative   Lives with wife/2025 and 1 dog   [place] [employer]   Social Drivers of Corporate investment banker Strain: Low Risk  (08/17/2023)   Overall Financial Resource Strain (CARDIA)    Difficulty of Paying Living Expenses: Not hard at all  Food Insecurity: No Food Insecurity (08/17/2023)   Hunger Vital Sign    Worried About Running Out of Food in the Last Year: Never true    Ran Out of Food in the Last Year: Never true  Transportation Needs: No Transportation Needs (08/17/2023)   PRAPARE - Administrator, Civil Service (Medical): No    Lack of Transportation (Non-Medical): No  Physical Activity: Sufficiently Active (08/17/2023)   Exercise Vital Sign    Days of Exercise per Week: 7 days    Minutes of Exercise per Session: 30 min  Stress: No Stress Concern Present (08/17/2023)   [employer] of Occupational Health - Occupational Stress Questionnaire    Feeling of Stress: Not at all  Social Connections: Moderately Isolated (08/17/2023)   Social Connection and Isolation Panel    Frequency of Communication with Friends and Family: Three times a week    Frequency of Social Gatherings with Friends and Family: Once a week    Attends Religious Services: Never    Database administrator or Organizations: No    Attends Engineer, structural: Not on file    Marital Status: Married    Tobacco Counseling Counseling given: Not Answered    Clinical  Intake:  Pre-visit preparation completed: Yes  Pain : No/denies pain     BMI - recorded: 28.17 Nutritional Status: BMI 25 -29 Overweight Nutritional Risks: None Diabetes: No  Lab Results  Component Value Date   HGBA1C 4.7 (L) 02/14/2020     How often do you need to have someone help you when you read instructions,  pamphlets, or other written materials from your doctor or pharmacy?: 1 - Never  Interpreter Needed?: No  Information entered by :: Docie Abramovich, RMA   Activities of Daily Living     08/17/2023   12:14 PM 01/19/2023    5:07 PM  In your present state of health, do you have any difficulty performing the following activities:  Hearing? 0 0  Vision? 0 0  Difficulty concentrating or making decisions? 0 0  Walking or climbing stairs? 0   Dressing or bathing? 0   Doing errands, shopping? 0 1  Preparing Food and eating ? N   Using the Toilet? N   In the past six months, have you accidently leaked urine? N   Do you have problems with loss of bowel control? N   Managing your Medications? N   Managing your Finances? N   Housekeeping or managing your Housekeeping? N     Patient Care Team: Geofm Glade PARAS, MD as PCP - General (Internal Medicine) Verlin Lonni BIRCH, MD as PCP - Cardiology (Cardiology) Leslee Reusing, MD as Consulting Physician (Ophthalmology)  I have updated your Care Teams any recent Medical Services you may have received from other providers in the past year.     Assessment:   This is a routine wellness examination for Maxwell Fox.  Hearing/Vision screen Hearing Screening - Comments:: Denies hearing difficulties   Vision Screening - Comments:: Denies vision issues. Dr. Leslee    Goals Addressed   None    Depression Screen     08/18/2023   10:57 AM 07/21/2022   10:25 AM 07/09/2022   12:34 PM 04/20/2022   10:42 AM 07/18/2021    9:55 AM 07/18/2021    9:54 AM 03/12/2021    9:17 AM  PHQ 2/9 Scores  PHQ - 2 Score 3 0 0 0 0 0 0  PHQ- 9  Score 3   0 0      Fall Risk     08/17/2023   12:14 PM 07/21/2022   10:24 AM 07/08/2022    7:11 AM 04/20/2022   10:40 AM 01/14/2022   10:34 AM  Fall Risk   Falls in the past year? 0 0 0 0 0  Number falls in past yr: 0 0 0 0 0  Injury with Fall? 0 0 0 0 0  Risk for fall due to :  No Fall Risks Medication side effect No Fall Risks No Fall Risks  Follow up Falls evaluation completed;Falls prevention discussed Falls evaluation completed Falls prevention discussed;Education provided;Falls evaluation completed Falls evaluation completed Falls evaluation completed      Data saved with a previous flowsheet row definition    MEDICARE RISK AT HOME:  Medicare Risk at Home Any stairs in or around the home?: (Patient-Rptd) Yes If so, are there any without handrails?: (Patient-Rptd) Yes Home free of loose throw rugs in walkways, pet beds, electrical cords, etc?: (Patient-Rptd) Yes Adequate lighting in your home to reduce risk of falls?: (Patient-Rptd) Yes Life alert?: (Patient-Rptd) No Use of a cane, walker or w/c?: (Patient-Rptd) No Grab bars in the bathroom?: (Patient-Rptd) Yes Shower chair or bench in shower?: (Patient-Rptd) Yes Elevated toilet seat or a handicapped toilet?: (Patient-Rptd) Yes  TIMED UP AND GO:  Was the test performed?  No  Cognitive Function: Declined/Normal: No cognitive concerns noted by patient or family. Patient alert, oriented, able to answer questions appropriately and recall recent events. No signs of memory loss or confusion.        07/09/2022  12:35 PM  6CIT Screen  What Year? 0 points  What month? 0 points  What time? 0 points  Count back from 20 2 points  Months in reverse 4 points  Repeat phrase 2 points  Total Score 8 points    Immunizations Immunization History  Administered Date(s) Administered   Fluad Quad(high Dose 65+) 11/12/2018, 10/08/2020, 11/18/2021   Fluad Trivalent(High Dose 65+) 11/20/2022   Influenza, High Dose Seasonal PF 11/08/2017    Influenza,inj,Quad PF,6+ Mos 10/26/2013, 11/01/2014   Influenza-Unspecified 10/26/2013, 11/01/2014, 01/02/2017   PFIZER Comirnaty(Gray Top)Covid-19 Tri-Sucrose Vaccine 03/31/2019, 04/28/2019, 11/13/2019, 05/13/2020   PFIZER(Purple Top)SARS-COV-2 Vaccination 03/31/2019, 04/28/2019, 11/13/2019, 05/13/2020   Pfizer Covid-19 Vaccine Bivalent Booster 46yrs & up 12/03/2020   Pneumococcal Conjugate-13 01/10/2018   Pneumococcal Polysaccharide-23 10/26/2013   Tdap 04/15/2022    Screening Tests Health Maintenance  Topic Date Due   Zoster Vaccines- Shingrix (1 of 2) Never done   COVID-19 Vaccine (10 - 2024-25 season) 10/04/2022   Medicare Annual Wellness (AWV)  07/09/2023   INFLUENZA VACCINE  09/03/2023   DEXA SCAN  01/03/2025   DTaP/Tdap/Td (2 - Td or Tdap) 04/14/2032   Pneumococcal Vaccine: 50+ Years  Completed   Hepatitis C Screening  Completed   Hepatitis B Vaccines  Aged Out   HPV VACCINES  Aged Out   Meningococcal B Vaccine  Aged Out   Colonoscopy  Discontinued    Health Maintenance  Health Maintenance Due  Topic Date Due   Zoster Vaccines- Shingrix (1 of 2) Never done   COVID-19 Vaccine (10 - 2024-25 season) 10/04/2022   Medicare Annual Wellness (AWV)  07/09/2023   Health Maintenance Items Addressed: See Nurse Notes at the end of this note  Additional Screening:  Vision Screening: Recommended annual ophthalmology exams for early detection of glaucoma and other disorders of the eye. Would you like a referral to an eye doctor? No    Dental Screening: Recommended annual dental exams for proper oral hygiene  Community Resource Referral / Chronic Care Management: CRR required this visit?  No   CCM required this visit?  No   Plan:    I have personally reviewed and noted the following in the patient's chart:   Medical and social history Use of alcohol, tobacco or illicit drugs  Current medications and supplements including opioid prescriptions. Patient is not  currently taking opioid prescriptions. Functional ability and status Nutritional status Physical activity Advanced directives List of other physicians Hospitalizations, surgeries, and ER visits in previous 12 months Vitals Screenings to include cognitive, depression, and falls Referrals and appointments  In addition, I have reviewed and discussed with patient certain preventive protocols, quality metrics, and best practice recommendations. A written personalized care plan for preventive services as well as general preventive health recommendations were provided to patient.   Colbey Wirtanen L Nicholas Ossa, CMA   08/18/2023   After Visit Summary: (MyChart) Due to this being a telephonic visit, the after visit summary with patients personalized plan was offered to patient via MyChart   Notes: Patient declines the Shingrix vaccine.  Patient is up to date on all health maintenance with no concerns to address today.

## 2023-08-18 NOTE — Patient Instructions (Signed)
 [name] , Thank you for taking time out of your busy schedule to complete your Annual Wellness Visit with me. I enjoyed our conversation and look forward to speaking with you again next year. I, as well as your care team,  appreciate your ongoing commitment to your health goals. Please review the following plan we discussed and let me know if I can assist you in the future. Your Game plan/ To Do List   Follow up Visits: Next Medicare AWV with our clinical staff: 08/18/2024.   Have you seen your provider in the last 6 months (3 months if uncontrolled diabetes)? Yes Next Office Visit with your provider: 09/20/2023.  Clinician Recommendations:  Aim for 30 minutes of exercise or brisk walking, 6-8 glasses of water , and 5 servings of fruits and vegetables each day. Keep up the good work.      This is a list of the screening recommended for you and due dates:  Health Maintenance  Topic Date Due   Zoster (Shingles) Vaccine (1 of 2) Never done   COVID-19 Vaccine (10 - 2024-25 season) 10/04/2022   Medicare Annual Wellness Visit  07/09/2023   Flu Shot  09/03/2023   DEXA scan (bone density measurement)  01/03/2025   DTaP/Tdap/Td vaccine (2 - Td or Tdap) 04/14/2032   Pneumococcal Vaccine for age over 48  Completed   Hepatitis C Screening  Completed   Hepatitis B Vaccine  Aged Out   HPV Vaccine  Aged Out   Meningitis B Vaccine  Aged Out   Colon Cancer Screening  Discontinued    Advanced directives: (Copy Requested) Please bring a copy of your health care power of attorney and living will to the office to be added to your chart at your convenience. You can mail to [facility] [address] [place], [place] [address] or email to [email or web address] Advance Care Planning is important because it:  [x]  Makes sure you receive the medical care that is consistent with your values, goals, and preferences  [x]  It provides guidance to your family and loved ones and reduces their  decisional burden about whether or not they are making the right decisions based on your wishes.  Follow the link provided in your after visit summary or read over the paperwork we have mailed to you to help you started getting your Advance Directives in place. If you need assistance in completing these, please reach out to us  so that we can help you!  See attachments for Preventive Care and Fall Prevention Tips.

## 2023-08-24 ENCOUNTER — Encounter: Payer: Self-pay | Admitting: Cardiovascular Disease

## 2023-08-29 ENCOUNTER — Other Ambulatory Visit: Payer: Self-pay | Admitting: Nurse Practitioner

## 2023-09-19 ENCOUNTER — Encounter: Payer: Self-pay | Admitting: Internal Medicine

## 2023-09-19 NOTE — Patient Instructions (Addendum)
    Have blood work done today.     Medications changes include :   increase to cymbalta  60 mg daily      Return in about 6 months (around 03/22/2024) for follow up.

## 2023-09-19 NOTE — Progress Notes (Unsigned)
 Subjective:    Patient ID: Maxwell Fox, male    DOB: 13-Dec-1945, 78 y.o.   MRN: [id number]     HPI Maxwell Fox is here for follow up of his chronic medical problems.  Increased anxiety - clonazepam  not helping as much.  Feels anxious all the time.     Has started exercising again -- it took him forever to recover from the PNA.    Medications and allergies reviewed with patient and updated if appropriate.  Current Outpatient Medications on File Prior to Visit  Medication Sig Dispense Refill   acetaminophen  (TYLENOL ) 325 MG tablet Take 2 tablets (650 mg total) by mouth every 6 (six) hours as needed for moderate pain.     aspirin  81 MG chewable tablet Chew 1 tablet (81 mg total) by mouth daily. 90 tablet 3   calcium  carbonate (OS-CAL - DOSED IN MG OF ELEMENTAL CALCIUM ) 1250 (500 Ca) MG tablet Take 1 tablet by mouth daily with supper.     Cholecalciferol (VITAMIN D ) 2000 UNITS tablet Take 1 tablet (2,000 Units total) by mouth daily. 30 tablet 11   clonazePAM  (KLONOPIN ) 1 MG tablet TAKE 1 TABLET(1 MG) BY MOUTH TWICE DAILY 180 tablet 1   clopidogrel  (PLAVIX ) 75 MG tablet TAKE 1 TABLET(75 MG) BY MOUTH DAILY WITH BREAKFAST 90 tablet 0   DULoxetine  (CYMBALTA ) 30 MG capsule Take 1 capsule (30 mg total) by mouth daily. 90 capsule 1   finasteride  (PROSCAR ) 5 MG tablet TAKE 1 TABLET(5 MG) BY MOUTH DAILY 90 tablet 1   levothyroxine  (SYNTHROID ) 88 MCG tablet TAKE 1 TABLET BY MOUTH EVERY DAY BEFORE BREAKFAST 90 tablet 3   Multiple Vitamin (MULTIVITAMIN WITH MINERALS) TABS tablet Take 1 tablet by mouth in the morning.     ondansetron  (ZOFRAN ) 4 MG tablet Take 1 tablet (4 mg total) by mouth daily as needed for nausea or vomiting. 30 tablet 0   pantoprazole  (PROTONIX ) 40 MG tablet TAKE 1 TABLET(40 MG) BY MOUTH DAILY. FOLLOW UP WITH PRIMARY CARE PROVIDER FOR ADDITIONAL REFILLS 90 tablet 0   predniSONE  (DELTASONE ) 5 MG tablet TAKE 1 TABLET BY MOUTH EVERY DAY WITH BREAKFAST 90 tablet 3   simvastatin   (ZOCOR ) 20 MG tablet Take 1 tablet (20 mg total) by mouth at bedtime. 90 tablet 3   tamsulosin  (FLOMAX ) 0.4 MG CAPS capsule TAKE 1 CAPSULE BY MOUTH EVERY DAY AFTER SUPPER 90 capsule 1   Current Facility-Administered Medications on File Prior to Visit  Medication Dose Route Frequency Provider Last Rate Last Admin   [START ON 12/27/2023] denosumab  (PROLIA ) injection 60 mg  60 mg Subcutaneous Q6 months Trixie File, MD         Review of Systems  Constitutional:  Negative for fever.  Respiratory:  Negative for cough, shortness of breath and wheezing.   Cardiovascular:  Negative for chest pain, palpitations and leg swelling.  Neurological:  Negative for light-headedness and headaches.  Psychiatric/Behavioral:  Positive for dysphoric mood. Negative for sleep disturbance. The patient is nervous/anxious.        Objective:   Vitals:   09/20/23 0855  BP: 106/72  Pulse: 78  Temp: 97.8 F (36.6 C)  SpO2: 98%   BP Readings from Last 3 Encounters:  09/20/23 106/72  06/30/23 120/78  03/23/23 110/74   Wt Readings from Last 3 Encounters:  09/20/23 211 lb (95.7 kg)  08/18/23 202 lb (91.6 kg)  06/30/23 202 lb (91.6 kg)   Body mass index is 29.43 kg/m.  Physical Exam Constitutional:      General: He is not in acute distress.    Appearance: Normal appearance. He is not ill-appearing.  HENT:     Head: Normocephalic and atraumatic.  Eyes:     Conjunctiva/sclera: Conjunctivae normal.  Cardiovascular:     Rate and Rhythm: Normal rate and regular rhythm.     Heart sounds: Normal heart sounds.  Pulmonary:     Effort: Pulmonary effort is normal. No respiratory distress.     Breath sounds: Normal breath sounds. No wheezing or rales.  Musculoskeletal:     Right lower leg: No edema.     Left lower leg: No edema.  Skin:    General: Skin is warm and dry.     Findings: No rash.  Neurological:     Mental Status: He is alert. Mental status is at baseline.  Psychiatric:        Mood  and Affect: Mood normal.        Lab Results  Component Value Date   WBC 11.6 (H) 01/21/2023   HGB 11.3 (L) 01/21/2023   HCT 33.8 (L) 01/21/2023   PLT 164 01/21/2023   GLUCOSE 89 03/23/2023   CHOL 165 02/05/2023   TRIG 140 02/05/2023   HDL 74 02/05/2023   LDLCALC 67 02/05/2023   ALT 28 02/05/2023   AST 38 02/05/2023   NA 140 03/23/2023   K 4.2 03/23/2023   CL 109 03/23/2023   CREATININE 0.85 03/23/2023   BUN 10 03/23/2023   CO2 23 03/23/2023   TSH 0.21 (L) 07/05/2023   INR 1.2 01/19/2023   HGBA1C 4.7 (L) 02/14/2020     Assessment & Plan:    See Problem List for Assessment and Plan of chronic medical problems.

## 2023-09-20 ENCOUNTER — Ambulatory Visit (INDEPENDENT_AMBULATORY_CARE_PROVIDER_SITE_OTHER): Payer: Medicare Other | Admitting: Internal Medicine

## 2023-09-20 VITALS — BP 106/72 | HR 78 | Temp 97.8°F | Ht 71.0 in | Wt 211.0 lb

## 2023-09-20 DIAGNOSIS — E039 Hypothyroidism, unspecified: Secondary | ICD-10-CM

## 2023-09-20 DIAGNOSIS — R110 Nausea: Secondary | ICD-10-CM

## 2023-09-20 DIAGNOSIS — M810 Age-related osteoporosis without current pathological fracture: Secondary | ICD-10-CM

## 2023-09-20 DIAGNOSIS — F419 Anxiety disorder, unspecified: Secondary | ICD-10-CM | POA: Diagnosis not present

## 2023-09-20 DIAGNOSIS — R350 Frequency of micturition: Secondary | ICD-10-CM

## 2023-09-20 DIAGNOSIS — E273 Drug-induced adrenocortical insufficiency: Secondary | ICD-10-CM | POA: Diagnosis not present

## 2023-09-20 DIAGNOSIS — M545 Low back pain, unspecified: Secondary | ICD-10-CM | POA: Diagnosis not present

## 2023-09-20 DIAGNOSIS — K2100 Gastro-esophageal reflux disease with esophagitis, without bleeding: Secondary | ICD-10-CM | POA: Diagnosis not present

## 2023-09-20 DIAGNOSIS — C7931 Secondary malignant neoplasm of brain: Secondary | ICD-10-CM

## 2023-09-20 DIAGNOSIS — I5022 Chronic systolic (congestive) heart failure: Secondary | ICD-10-CM

## 2023-09-20 DIAGNOSIS — N401 Enlarged prostate with lower urinary tract symptoms: Secondary | ICD-10-CM | POA: Diagnosis not present

## 2023-09-20 DIAGNOSIS — G8929 Other chronic pain: Secondary | ICD-10-CM

## 2023-09-20 DIAGNOSIS — F32A Depression, unspecified: Secondary | ICD-10-CM

## 2023-09-20 DIAGNOSIS — D509 Iron deficiency anemia, unspecified: Secondary | ICD-10-CM

## 2023-09-20 DIAGNOSIS — E538 Deficiency of other specified B group vitamins: Secondary | ICD-10-CM | POA: Diagnosis not present

## 2023-09-20 DIAGNOSIS — R739 Hyperglycemia, unspecified: Secondary | ICD-10-CM | POA: Insufficient documentation

## 2023-09-20 LAB — VITAMIN D 25 HYDROXY (VIT D DEFICIENCY, FRACTURES): VITD: 45.29 ng/mL (ref 30.00–100.00)

## 2023-09-20 LAB — COMPREHENSIVE METABOLIC PANEL WITH GFR
ALT: 14 U/L (ref 0–53)
AST: 25 U/L (ref 0–37)
Albumin: 3.6 g/dL (ref 3.5–5.2)
Alkaline Phosphatase: 38 U/L — ABNORMAL LOW (ref 39–117)
BUN: 19 mg/dL (ref 6–23)
CO2: 24 meq/L (ref 19–32)
Calcium: 8.7 mg/dL (ref 8.4–10.5)
Chloride: 107 meq/L (ref 96–112)
Creatinine, Ser: 1.05 mg/dL (ref 0.40–1.50)
GFR: 68.01 mL/min (ref >60.00)
Glucose, Bld: 84 mg/dL (ref 70–99)
Potassium: 4.8 meq/L (ref 3.5–5.1)
Sodium: 140 meq/L (ref 135–145)
Total Bilirubin: 0.5 mg/dL (ref 0.2–1.2)
Total Protein: 6.3 g/dL (ref 6.0–8.3)

## 2023-09-20 LAB — CBC WITH DIFFERENTIAL/PLATELET
Basophils Absolute: 0.1 K/uL (ref 0.0–0.1)
Basophils Relative: 1.3 % (ref 0.0–3.0)
Eosinophils Absolute: 0.1 K/uL (ref 0.0–0.7)
Eosinophils Relative: 1.7 % (ref 0.0–5.0)
HCT: 39.3 % (ref 39.0–52.0)
Hemoglobin: 12.3 g/dL — ABNORMAL LOW (ref 13.0–17.0)
Lymphocytes Relative: 13.4 % (ref 12.0–46.0)
Lymphs Abs: 1 K/uL (ref 0.7–4.0)
MCHC: 31.4 g/dL (ref 30.0–36.0)
MCV: 83.5 fl (ref 78.0–100.0)
Monocytes Absolute: 0.7 K/uL (ref 0.1–1.0)
Monocytes Relative: 9.3 % (ref 3.0–12.0)
Neutro Abs: 5.5 K/uL (ref 1.4–7.7)
Neutrophils Relative %: 74.3 % (ref 43.0–77.0)
Platelets: 230 K/uL (ref 150.0–400.0)
RBC: 4.71 Mil/uL (ref 4.22–5.81)
RDW: 15.7 % — ABNORMAL HIGH (ref 11.5–15.5)
WBC: 7.4 K/uL (ref 4.0–10.5)

## 2023-09-20 LAB — IBC PANEL
Iron: 36 ug/dL — ABNORMAL LOW (ref 42–165)
Saturation Ratios: 10.2 % — ABNORMAL LOW (ref 20.0–50.0)
TIBC: 354.2 ug/dL (ref 250.0–450.0)
Transferrin: 253 mg/dL (ref 212.0–360.0)

## 2023-09-20 LAB — HEMOGLOBIN A1C: Hgb A1c MFr Bld: 5.6 % (ref 4.6–6.5)

## 2023-09-20 LAB — FERRITIN: Ferritin: 20.4 ng/mL — ABNORMAL LOW (ref 22.0–322.0)

## 2023-09-20 LAB — VITAMIN B12: Vitamin B-12: 293 pg/mL (ref 211–911)

## 2023-09-20 MED ORDER — DULOXETINE HCL 60 MG PO CPEP
60.0000 mg | ORAL_CAPSULE | Freq: Every day | ORAL | 1 refills | Status: AC
Start: 2023-09-20 — End: ?

## 2023-09-20 MED ORDER — TAMSULOSIN HCL 0.4 MG PO CAPS: ORAL_CAPSULE | ORAL | 1 refills | Status: AC

## 2023-09-20 MED ORDER — PANTOPRAZOLE SODIUM 40 MG PO TBEC: 40.0000 mg | DELAYED_RELEASE_TABLET | Freq: Every day | ORAL | 2 refills | Status: DC

## 2023-09-20 NOTE — Assessment & Plan Note (Signed)
Chronic Management per endocrine 

## 2023-09-20 NOTE — Assessment & Plan Note (Signed)
 Chronic No longer on any pain medications Pain is tolerable

## 2023-09-20 NOTE — Assessment & Plan Note (Signed)
 Chronic Lab Results  Component Value Date   HGBA1C 4.7 (L) 02/14/2020   Check a1c Low sugar / carb diet Stressed regular exercise

## 2023-09-20 NOTE — Assessment & Plan Note (Addendum)
 Chronic Malabsorption related to previous gastric bypass H/o iron  infusions Check cbc, iron  panel, cmp

## 2023-09-20 NOTE — Assessment & Plan Note (Signed)
Chronic Check b12 level

## 2023-09-20 NOTE — Assessment & Plan Note (Addendum)
 Chronic On Prolia  via endocrine Ck vitamin d  level

## 2023-09-20 NOTE — Assessment & Plan Note (Addendum)
 Chronic Euvolemic Following with cardiology Continue regular exercise

## 2023-09-20 NOTE — Assessment & Plan Note (Signed)
Chronic Symptoms controlled-feels like he is emptying his bladder Continue Flomax 0.4 mg daily, proscar 5 mg daily

## 2023-09-20 NOTE — Assessment & Plan Note (Signed)
 Chronic GERD controlled Continue pantoprazole 40 mg daily

## 2023-09-20 NOTE — Assessment & Plan Note (Addendum)
 Chronic Following with oncology In remission w/o evidence of recurrence

## 2023-09-20 NOTE — Assessment & Plan Note (Signed)
 Chronic Not controlled - recent family issues has made it worse Anxious and depressed at times Continue clonazepam  1 mg twice daily Continue Cymbalta  but increase to 60 mg daily It still anxious after a month can consider increasing further or adding in buspar Discussed deep breathing, journaling, exercise

## 2023-09-20 NOTE — Assessment & Plan Note (Signed)
 Chronic Occasional - maybe 2/ month Continue zofran  4 mg daily prn

## 2023-09-20 NOTE — Assessment & Plan Note (Signed)
 Chronic  Clinically euthyroid Management per endocrine Currently taking levothyroxine  88 mcg daily

## 2023-09-21 ENCOUNTER — Encounter: Payer: Self-pay | Admitting: Internal Medicine

## 2023-09-21 ENCOUNTER — Ambulatory Visit: Payer: Self-pay | Admitting: Internal Medicine

## 2023-09-28 ENCOUNTER — Encounter: Payer: Self-pay | Admitting: Cardiovascular Disease

## 2023-09-28 DIAGNOSIS — W1800XA Striking against unspecified object with subsequent fall, initial encounter: Secondary | ICD-10-CM | POA: Diagnosis not present

## 2023-09-28 DIAGNOSIS — R079 Chest pain, unspecified: Secondary | ICD-10-CM | POA: Diagnosis not present

## 2023-11-09 ENCOUNTER — Telehealth: Payer: Self-pay

## 2023-11-09 NOTE — Telephone Encounter (Signed)
Does not need booster

## 2023-11-23 NOTE — Progress Notes (Signed)
 Cardiology Office Note   Date:  11/29/2023  ID:  Ranulfo Kall, DOB 1945/07/31, MRN [id number] PCP: Geofm Glade PARAS, MD  [facility] Providers Cardiologist:  Lonni Cash, MD Cardiology APP:  Carlin Delon BROCKS, NP     History of Present Illness Shrey Boike is a 78 y.o. male with a past medical history of CAD, HFrEF, GERD, frequent PVCs, hypothyroidism/adrenal insufficiency on chronic steroid use--follows with [facility], BPH, metastatic melanoma--followed by [facility], GI bleed secondary to AVMs,   09/02/2022 echo EF 40 to 45%, global hypokinesis, grade 1 DD, mild MR 08/13/2022 right left heart cath severe proximal LAD stenosis with PTCA/DES x 1 to proximal LAD 06/19/2022 MPI findings are consistent with infarction with peri-infarct ischemia, intermediate risk 06/19/2022 echo EF 30 to 35%, grade 1 DD, mild MR, mild thickening of the aortic valve, borderline dilatation of the ascending root at 38 mm 08/30/2018 echo EF 50%, grade 1 DD, mild LVH, mild MR, mild AS 08/24/2018 MPI no ischemia  Previously cared for by Dr. Levern, transitioned his care to Dr. Cash in 2021 for the evaluation of bradycardia.  He also has a history of metastatic melanoma it appears it was a initially diagnosed in 2011 >> metastasis to his brain s/p craniotomy, follows with [facility].  He also has hypothyroidism/adrenal insufficiency felt to be related to Pembrolizumab  infusions on chronic prednisone .  He was evaluated in May 2024 with concerns of DOE, an echo was arranged revealing newly reduced EF, MPI was arranged which was abnormal, intermediate risk > > underwent left heart cath on 08/13/2022>> with PTCA and DES x 1 to severely proximal LAD stenosis.    Most recently evaluated by Dr. Cash on 11/06/2022, at this time he was doing stable from a cardiac perspective, previously not been able to tolerate Entresto , Coreg , valsartan  or Crestor --trial of Zocor  was suggested as his LDL was uncontrolled at  132 with consideration for Repatha or Praluent if he was unable to tolerate this and to follow-up with cardiology in 1 year.   He presents today with concerns of worsening shortness of breath and weight gain over the last few months.  He mentions he can walk on a treadmill for short amount of time and he feels short of breath.  He has not increased his caloric intake in fact he feels like he does not eat much at all.  He recently suffered a fall secondary to tripping, also apparently landed on his chest after this fall so he is not sure if his shortness of breath is related to that.  He follows closely with his PCP, was previously following with [facility] however he has now follow-up with Dr. Vianne. He denies chest pain, palpitations, pnd, orthopnea, n, v, dizziness, syncope, or early satiety.     ROS: Review of Systems  Constitutional:  Positive for malaise/fatigue.  Respiratory:  Positive for shortness of breath.   Cardiovascular:  Positive for leg swelling.  Endo/Heme/Allergies:  Bruises/bleeds easily.  All other systems reviewed and are negative.    Studies Reviewed      Cardiac Studies & Procedures   ______________________________________________________________________________________________ CARDIAC CATHETERIZATION  CARDIAC CATHETERIZATION 08/13/2022  Conclusion   Mid Cx to Dist Cx lesion is 40% stenosed.   Prox LAD lesion is 95% stenosed.   1st Mrg lesion is 40% stenosed.   A drug-eluting stent was successfully placed using a STENT ONYX FRONTIER 3.5X15.   Post intervention, there is a 0% residual stenosis.  Severe proximal LAD stenosis Successful PTCA/DES x  1 proximal LAD Moderate non-obstructive disease in the first obtuse marginal ostium and in the distal Circumflex No disease in the large dominant RCA   Recommendations: DAPT with ASA/Plavix  for at least six months. Change Prilosec to Protonix  given use of Plavix .  Findings Coronary Findings Diagnostic   Dominance: Right  Left Anterior Descending Vessel is large. Prox LAD lesion is 95% stenosed.  Left Circumflex Vessel is large. Mid Cx to Dist Cx lesion is 40% stenosed.  First Obtuse Marginal Branch 1st Mrg lesion is 40% stenosed.  Right Coronary Artery Vessel is large.  Intervention  Prox LAD lesion Stent CATH VISTA GUIDE 6FR XBLAD3.5 guide catheter was inserted. Lesion crossed with guidewire using a WIRE COUGAR XT STRL 190CM. Pre-stent angioplasty was performed using a BALLN EMERGE MR 2.0X12. A drug-eluting stent was successfully placed using a STENT ONYX FRONTIER 3.5X15. Stent strut is well apposed. Post-stent angioplasty was performed using a BALLN [place] EMERGE MR [id number]. Post-Intervention Lesion Assessment The intervention was successful. Pre-interventional TIMI flow is 3. Post-intervention TIMI flow is 3. No complications occurred at this lesion. There is a 0% residual stenosis post intervention.   STRESS TESTS  MYOCARDIAL PERFUSION IMAGING 06/19/2022  Interpretation Summary   Findings are consistent with infarction with peri-infarct ischemia. The study is intermediate risk.   No ST deviation was noted.   LV perfusion is abnormal. Defect 1: There is a medium defect with severe reduction in uptake present in the apical to basal inferior and inferoseptal location(s) that is partially reversible. There is abnormal wall motion in the defect area. Consistent with infarction and peri-infarct ischemia.   Left ventricular function is abnormal. Global function is moderately reduced. Nuclear stress EF: 36 %. The left ventricular ejection fraction is moderately decreased (30-44%). End diastolic cavity size is mildly enlarged. End systolic cavity size is normal.   Prior study available for comparison from 08/24/2018. Inferoseptal infarct, LVEF 44%  Medium size, severe intensity mostly fixed (SDS 3) defect of the basal to apical inferior wall and mid-apical inferoseptal walls, consistent  with infarct and minimal peri-infarct ischemia. LVEF 36% with inferior and inferoseptal akinesis. This is an intermediate risk study. Compared to a prior study in 2020, the fixed defect, which is suspected to be a scar, is unchanged. LVEF is now reported to be lower - echo correlation is recommended.   ECHOCARDIOGRAM  ECHOCARDIOGRAM LIMITED 09/02/2022  Narrative ECHOCARDIOGRAM LIMITED REPORT    Patient Name:   JAQUEL GLASSBURN Date of Exam: 09/02/2022 Medical Rec #:  [id number]       Height:       71.0 in Accession #:    [id number]      Weight:       180.1 lb Date of Birth:  [birth year]        BSA:          2.017 m Patient Age:    77 years        BP:           122/78 mmHg Patient Gender: M               HR:           60 bpm. Exam Location:  [facility]  Procedure: Limited Echo, Limited Color Doppler, Cardiac Doppler and 3D Echo  Indications:    limited stat add on for CAD Native Vessel I25.10  History:        Patient has prior history of Echocardiogram examinations, most recent 06/19/2022. Risk Factors:Hypertension.  Sonographer:  Augustin Seals RDCS Referring Phys: [id number] ROSALINE HERO SWINYER  IMPRESSIONS   1. Left ventricular ejection fraction, by estimation, is 40 to 45%. Left ventricular ejection fraction by 3D volume is 50 %. The left ventricle has mildly decreased function. The left ventricle demonstrates global hypokinesis. Left ventricular diastolic parameters are consistent with Grade I diastolic dysfunction (impaired relaxation). 2. Right ventricular systolic function is normal. The right ventricular size is normal. Tricuspid regurgitation signal is inadequate for assessing PA pressure. 3. The mitral valve is normal in structure. Mild mitral valve regurgitation. No evidence of mitral stenosis. 4. The aortic valve is tricuspid. Aortic valve regurgitation is not visualized. Aortic valve sclerosis is present, with no evidence of aortic valve stenosis.  Comparison(s): The left  ventricular function has improved.  FINDINGS Left Ventricle: Left ventricular ejection fraction, by estimation, is 40 to 45%. Left ventricular ejection fraction by 3D volume is 50 %. The left ventricle has mildly decreased function. The left ventricle demonstrates global hypokinesis. 3D ejection fraction reviewed and evaluated as part of the interpretation. Alternate measurement of EF is felt to be most reflective of LV function. Left ventricular diastolic parameters are consistent with Grade I diastolic dysfunction (impaired relaxation). Normal left ventricular filling pressure.  Right Ventricle: The right ventricular size is normal. No increase in right ventricular wall thickness. Right ventricular systolic function is normal. Tricuspid regurgitation signal is inadequate for assessing PA pressure.  Left Atrium: Left atrial size was normal in size.  Right Atrium: Right atrial size was normal in size.  Pericardium: There is no evidence of pericardial effusion.  Mitral Valve: The mitral valve is normal in structure. Mild mitral valve regurgitation, with centrally-directed jet. No evidence of mitral valve stenosis.  Tricuspid Valve: The tricuspid valve is normal in structure. Tricuspid valve regurgitation is not demonstrated.  Aortic Valve: The aortic valve is tricuspid. Aortic valve regurgitation is not visualized. Aortic valve sclerosis is present, with no evidence of aortic valve stenosis.  Pulmonic Valve: The pulmonic valve was not well visualized. Pulmonic valve regurgitation is not visualized.  LEFT VENTRICLE PLAX 2D LVIDd:         3.60 cm         Diastology LVIDs:         2.50 cm         LV e' medial:    6.83 cm/s LV PW:         1.00 cm         LV E/e' medial:  10.1 LV IVS:        0.90 cm         LV e' lateral:   9.37 cm/s LV E/e' lateral: 7.4  3D Volume EF LV 3D EF:    Left ventricul ar ejection fraction by 3D volume is 50 %.  3D Volume EF: 3D EF:        50 % LV EDV:        162 ml LV ESV:       80 ml LV SV:        81 ml  LEFT ATRIUM         Index LA diam:    3.20 cm 1.59 cm/m AORTIC VALVE LVOT Vmax:   88.10 cm/s LVOT Vmean:  58.500 cm/s LVOT VTI:    0.187 m  MITRAL VALVE MV Area (PHT): 3.27 cm     SHUNTS MV Decel Time: 232 msec     Systemic VTI: 0.19 m MV E velocity: 68.90 cm/s MV  A velocity: 103.00 cm/s MV E/A ratio:  0.67  Mihai Croitoru MD Electronically signed by Jerel Balding MD Signature Date/Time: 09/02/2022/3:56:31 PM    Final          ______________________________________________________________________________________________      Risk Assessment/Calculations           Physical Exam VS:  BP 116/78   Pulse 68   Ht 5' 11 (1.803 m)   Wt 220 lb 12.8 oz (100.2 kg)   SpO2 98%   BMI 30.80 kg/m        Wt Readings from Last 3 Encounters:  11/29/23 220 lb 12.8 oz (100.2 kg)  09/20/23 211 lb (95.7 kg)  08/18/23 202 lb (91.6 kg)    GEN: Pale, no acute distress NECK: No JVD; No carotid bruits CARDIAC: RRR, no murmurs, rubs, gallops RESPIRATORY:  Clear to auscultation without rales, wheezing or rhonchi  ABDOMEN: Soft, non-tender, non-distended EXTREMITIES: +3 pitting edema to mid tibia; No deformity   ASSESSMENT AND PLAN CAD - s/p DES to proximal LAD in 2014, continue Plavix  75 mg daily, Zocor  20 mg daily, has not needed nitroglycerin .  He does not have any episodes of chest pain however he  is having some DOE, I think this is likely related to his heart failure and I am arranging a repeat echocardiogram however if his echocardiogram is unchanged we will proceed with an ischemic evaluation.  HFmrEF -NYHA class II-III for fatigue and shortness of breath, he is volume overloaded but his lungs are clear.  His weight is up 18 pounds since July and he does not feel he has increased his caloric intake.  Previously unable to tolerate Entresto , valsartan , Coreg .  Repeat CBC, proBNP, c-Met today--plan on starting him on SGLT2  inhibitor pending lab work.  PVCs -currently quiescent, unable to tolerate beta-blocker.  Dyslipidemia-most recent LDL was controlled at 67, previously intolerant to statin.  LP(a) was normal.  Continue Zocor  20 mg daily.  Hypothyroidism, adrenal insufficiency-will check thyroid  panel.       Dispo: Echo, proBNP, CBC, CMET, thyroid  panel, follow up 3 months.   Signed, Delon JAYSON Hoover, NP

## 2023-11-26 ENCOUNTER — Ambulatory Visit: Admitting: Cardiovascular Disease

## 2023-11-27 ENCOUNTER — Other Ambulatory Visit: Payer: Self-pay | Admitting: Cardiovascular Disease

## 2023-11-27 ENCOUNTER — Other Ambulatory Visit: Payer: Self-pay | Admitting: Internal Medicine

## 2023-11-29 ENCOUNTER — Ambulatory Visit: Attending: Cardiology | Admitting: Cardiology

## 2023-11-29 ENCOUNTER — Encounter: Payer: Self-pay | Admitting: Cardiology

## 2023-11-29 VITALS — BP 116/78 | HR 68 | Ht 71.0 in | Wt 220.8 lb

## 2023-11-29 DIAGNOSIS — I2510 Atherosclerotic heart disease of native coronary artery without angina pectoris: Secondary | ICD-10-CM

## 2023-11-29 DIAGNOSIS — E782 Mixed hyperlipidemia: Secondary | ICD-10-CM | POA: Diagnosis not present

## 2023-11-29 DIAGNOSIS — E273 Drug-induced adrenocortical insufficiency: Secondary | ICD-10-CM

## 2023-11-29 DIAGNOSIS — I5020 Unspecified systolic (congestive) heart failure: Secondary | ICD-10-CM | POA: Diagnosis present

## 2023-11-29 DIAGNOSIS — I493 Ventricular premature depolarization: Secondary | ICD-10-CM | POA: Diagnosis present

## 2023-11-29 DIAGNOSIS — R0609 Other forms of dyspnea: Secondary | ICD-10-CM | POA: Diagnosis present

## 2023-11-29 LAB — CBC

## 2023-11-29 NOTE — Patient Instructions (Addendum)
 Medication Instructions:  Your physician recommends that you continue on your current medications as directed. Please refer to the Current Medication list given to you today. *If you need a refill on your cardiac medications before your next appointment, please call your pharmacy*  Lab Work: TODAY-CMET, CBC, BNP, & TSH + Free T4 If you have labs (blood work) drawn today and your tests are completely normal, you will receive your results only by: MyChart Message (if you have MyChart) OR A paper copy in the mail If you have any lab test that is abnormal or we need to change your treatment, we will call you to review the results.  Testing/Procedures: Your physician has requested that you have an echocardiogram. Echocardiography is a painless test that uses sound waves to create images of your heart. It provides your doctor with information about the size and shape of your heart and how well your heart's chambers and valves are working. This procedure takes approximately one hour. There are no restrictions for this procedure. Please do NOT wear cologne, perfume, aftershave, or lotions (deodorant is allowed). Please arrive 15 minutes prior to your appointment time.  Please note: We ask at that you not bring children with you during ultrasound (echo/ vascular) testing. Due to room size and safety concerns, children are not allowed in the ultrasound rooms during exams. Our front office staff cannot provide observation of children in our lobby area while testing is being conducted. An adult accompanying a patient to their appointment will only be allowed in the ultrasound room at the discretion of the ultrasound technician under special circumstances. We apologize for any inconvenience.  Follow-Up: At [facility], you and your health needs are our priority.  As part of our continuing mission to provide you with exceptional heart care, our providers are all part of one team.  This team includes  your primary Cardiologist (physician) and Advanced Practice Providers or APPs (Physician Assistants and Nurse Practitioners) who all work together to provide you with the care you need, when you need it.  Your next appointment:   3 month(s)  Provider:   Lonni Cash, MD or Delon Hoover, NP   We recommend signing up for the patient portal called MyChart.  Sign up information is provided on this After Visit Summary.  MyChart is used to connect with patients for Virtual Visits (Telemedicine).  Patients are able to view lab/test results, encounter notes, upcoming appointments, etc.  Non-urgent messages can be sent to your provider as well.   To learn more about what you can do with MyChart, go to [email or web address].   Other Instructions

## 2023-11-30 ENCOUNTER — Ambulatory Visit: Payer: Self-pay | Admitting: Cardiology

## 2023-11-30 ENCOUNTER — Other Ambulatory Visit: Payer: Self-pay | Admitting: Cardiology

## 2023-11-30 DIAGNOSIS — I2510 Atherosclerotic heart disease of native coronary artery without angina pectoris: Secondary | ICD-10-CM

## 2023-11-30 DIAGNOSIS — I5020 Unspecified systolic (congestive) heart failure: Secondary | ICD-10-CM

## 2023-11-30 DIAGNOSIS — E273 Drug-induced adrenocortical insufficiency: Secondary | ICD-10-CM

## 2023-11-30 DIAGNOSIS — I493 Ventricular premature depolarization: Secondary | ICD-10-CM

## 2023-11-30 DIAGNOSIS — R0609 Other forms of dyspnea: Secondary | ICD-10-CM

## 2023-11-30 DIAGNOSIS — Z79899 Other long term (current) drug therapy: Secondary | ICD-10-CM

## 2023-11-30 DIAGNOSIS — I10 Essential (primary) hypertension: Secondary | ICD-10-CM

## 2023-11-30 LAB — COMPREHENSIVE METABOLIC PANEL WITH GFR
ALT: 16 IU/L (ref 0–44)
AST: 26 IU/L (ref 0–40)
Albumin: 3.9 g/dL (ref 3.8–4.8)
Alkaline Phosphatase: 52 IU/L (ref 47–123)
BUN/Creatinine Ratio: 17 (ref 10–24)
BUN: 20 mg/dL (ref 8–27)
Bilirubin Total: 0.4 mg/dL (ref 0.0–1.2)
CO2: 22 mmol/L (ref 20–29)
Calcium: 9.2 mg/dL (ref 8.6–10.2)
Chloride: 105 mmol/L (ref 96–106)
Creatinine, Ser: 1.16 mg/dL (ref 0.76–1.27)
Globulin, Total: 2.1 g/dL (ref 1.5–4.5)
Glucose: 87 mg/dL (ref 70–99)
Potassium: 5.2 mmol/L (ref 3.5–5.2)
Sodium: 141 mmol/L (ref 134–144)
Total Protein: 6 g/dL (ref 6.0–8.5)
eGFR: 64 mL/min/1.73

## 2023-11-30 LAB — CBC
Hematocrit: 38.6 % (ref 37.5–51.0)
Hemoglobin: 11.8 g/dL — ABNORMAL LOW (ref 13.0–17.7)
MCH: 25.4 pg — ABNORMAL LOW (ref 26.6–33.0)
MCHC: 30.6 g/dL — ABNORMAL LOW (ref 31.5–35.7)
MCV: 83 fL (ref 79–97)
Platelets: 247 x10E3/uL (ref 150–450)
RBC: 4.64 x10E6/uL (ref 4.14–5.80)
RDW: 14.9 % (ref 11.6–15.4)
WBC: 6.9 x10E3/uL (ref 3.4–10.8)

## 2023-11-30 LAB — TSH+FREE T4
Free T4: 1.35 ng/dL (ref 0.82–1.77)
TSH: 1.25 u[IU]/mL (ref 0.450–4.500)

## 2023-11-30 LAB — PRO B NATRIURETIC PEPTIDE: NT-Pro BNP: 1303 pg/mL — ABNORMAL HIGH (ref 0–486)

## 2023-11-30 MED ORDER — FUROSEMIDE 20 MG PO TABS: 20.0000 mg | ORAL_TABLET | Freq: Every day | ORAL | 0 refills | Status: DC

## 2023-11-30 MED ORDER — SIMVASTATIN 20 MG PO TABS: 20.0000 mg | ORAL_TABLET | Freq: Every day | ORAL | 3 refills | Status: AC

## 2023-11-30 MED ORDER — NITROGLYCERIN 0.4 MG SL SUBL: 0.4000 mg | SUBLINGUAL_TABLET | SUBLINGUAL | 3 refills | Status: AC | PRN

## 2023-11-30 MED ORDER — EMPAGLIFLOZIN 10 MG PO TABS: 10.0000 mg | ORAL_TABLET | Freq: Every day | ORAL | 1 refills | Status: AC

## 2023-12-01 DIAGNOSIS — H26493 Other secondary cataract, bilateral: Secondary | ICD-10-CM | POA: Diagnosis not present

## 2023-12-03 DIAGNOSIS — Z85820 Personal history of malignant melanoma of skin: Secondary | ICD-10-CM | POA: Diagnosis not present

## 2023-12-03 DIAGNOSIS — Z08 Encounter for follow-up examination after completed treatment for malignant neoplasm: Secondary | ICD-10-CM | POA: Diagnosis not present

## 2023-12-03 DIAGNOSIS — L814 Other melanin hyperpigmentation: Secondary | ICD-10-CM | POA: Diagnosis not present

## 2023-12-03 DIAGNOSIS — D1801 Hemangioma of skin and subcutaneous tissue: Secondary | ICD-10-CM | POA: Diagnosis not present

## 2023-12-03 DIAGNOSIS — L821 Other seborrheic keratosis: Secondary | ICD-10-CM | POA: Diagnosis not present

## 2023-12-06 ENCOUNTER — Ambulatory Visit (INDEPENDENT_AMBULATORY_CARE_PROVIDER_SITE_OTHER)

## 2023-12-06 DIAGNOSIS — Z23 Encounter for immunization: Secondary | ICD-10-CM | POA: Diagnosis not present

## 2023-12-06 NOTE — Telephone Encounter (Signed)
 Prolia  VOB initiated via [email or web address]  Next Prolia  inj DUE: 01/01/24

## 2023-12-08 DIAGNOSIS — H26492 Other secondary cataract, left eye: Secondary | ICD-10-CM | POA: Diagnosis not present

## 2023-12-09 ENCOUNTER — Other Ambulatory Visit: Payer: Self-pay | Admitting: Nurse Practitioner

## 2023-12-10 ENCOUNTER — Other Ambulatory Visit: Payer: Self-pay

## 2023-12-10 DIAGNOSIS — E273 Drug-induced adrenocortical insufficiency: Secondary | ICD-10-CM | POA: Diagnosis not present

## 2023-12-10 DIAGNOSIS — I10 Essential (primary) hypertension: Secondary | ICD-10-CM | POA: Diagnosis not present

## 2023-12-10 DIAGNOSIS — I2510 Atherosclerotic heart disease of native coronary artery without angina pectoris: Secondary | ICD-10-CM | POA: Diagnosis not present

## 2023-12-10 DIAGNOSIS — R0609 Other forms of dyspnea: Secondary | ICD-10-CM | POA: Diagnosis not present

## 2023-12-10 DIAGNOSIS — Z79899 Other long term (current) drug therapy: Secondary | ICD-10-CM | POA: Diagnosis not present

## 2023-12-10 DIAGNOSIS — I5020 Unspecified systolic (congestive) heart failure: Secondary | ICD-10-CM | POA: Diagnosis not present

## 2023-12-10 DIAGNOSIS — I493 Ventricular premature depolarization: Secondary | ICD-10-CM

## 2023-12-11 LAB — BASIC METABOLIC PANEL WITH GFR
BUN/Creatinine Ratio: 14 (ref 10–24)
BUN: 19 mg/dL (ref 8–27)
CO2: 20 mmol/L (ref 20–29)
Calcium: 9.6 mg/dL (ref 8.6–10.2)
Chloride: 104 mmol/L (ref 96–106)
Creatinine, Ser: 1.33 mg/dL — ABNORMAL HIGH (ref 0.76–1.27)
Glucose: 95 mg/dL (ref 70–99)
Potassium: 5.2 mmol/L (ref 3.5–5.2)
Sodium: 141 mmol/L (ref 134–144)
eGFR: 55 mL/min/1.73 — ABNORMAL LOW

## 2023-12-13 ENCOUNTER — Ambulatory Visit: Payer: Self-pay | Admitting: Cardiology

## 2023-12-13 DIAGNOSIS — I10 Essential (primary) hypertension: Secondary | ICD-10-CM

## 2023-12-15 DIAGNOSIS — E039 Hypothyroidism, unspecified: Secondary | ICD-10-CM | POA: Diagnosis not present

## 2023-12-15 DIAGNOSIS — Z87891 Personal history of nicotine dependence: Secondary | ICD-10-CM | POA: Diagnosis not present

## 2023-12-15 DIAGNOSIS — Z9889 Other specified postprocedural states: Secondary | ICD-10-CM | POA: Diagnosis not present

## 2023-12-15 DIAGNOSIS — W1839XA Other fall on same level, initial encounter: Secondary | ICD-10-CM | POA: Diagnosis not present

## 2023-12-15 DIAGNOSIS — Z9225 Personal history of immunosupression therapy: Secondary | ICD-10-CM | POA: Diagnosis not present

## 2023-12-15 DIAGNOSIS — X58XXXD Exposure to other specified factors, subsequent encounter: Secondary | ICD-10-CM | POA: Diagnosis not present

## 2023-12-15 DIAGNOSIS — Z85820 Personal history of malignant melanoma of skin: Secondary | ICD-10-CM | POA: Diagnosis not present

## 2023-12-15 DIAGNOSIS — Z923 Personal history of irradiation: Secondary | ICD-10-CM | POA: Diagnosis not present

## 2023-12-15 DIAGNOSIS — S2222XD Fracture of body of sternum, subsequent encounter for fracture with routine healing: Secondary | ICD-10-CM | POA: Diagnosis not present

## 2023-12-15 DIAGNOSIS — M549 Dorsalgia, unspecified: Secondary | ICD-10-CM | POA: Diagnosis not present

## 2023-12-15 DIAGNOSIS — Z79899 Other long term (current) drug therapy: Secondary | ICD-10-CM | POA: Diagnosis not present

## 2023-12-15 DIAGNOSIS — Z9221 Personal history of antineoplastic chemotherapy: Secondary | ICD-10-CM | POA: Diagnosis not present

## 2023-12-15 DIAGNOSIS — I5022 Chronic systolic (congestive) heart failure: Secondary | ICD-10-CM | POA: Diagnosis not present

## 2023-12-15 DIAGNOSIS — Z79890 Hormone replacement therapy (postmenopausal): Secondary | ICD-10-CM | POA: Diagnosis not present

## 2023-12-15 DIAGNOSIS — S2220XD Unspecified fracture of sternum, subsequent encounter for fracture with routine healing: Secondary | ICD-10-CM | POA: Diagnosis not present

## 2023-12-15 DIAGNOSIS — Z9226 Personal history of immune checkpoint inhibitor therapy: Secondary | ICD-10-CM | POA: Diagnosis not present

## 2023-12-15 DIAGNOSIS — C7931 Secondary malignant neoplasm of brain: Secondary | ICD-10-CM | POA: Diagnosis not present

## 2023-12-15 DIAGNOSIS — E2740 Unspecified adrenocortical insufficiency: Secondary | ICD-10-CM | POA: Diagnosis not present

## 2023-12-15 DIAGNOSIS — Z08 Encounter for follow-up examination after completed treatment for malignant neoplasm: Secondary | ICD-10-CM | POA: Diagnosis not present

## 2023-12-15 DIAGNOSIS — Z96611 Presence of right artificial shoulder joint: Secondary | ICD-10-CM | POA: Diagnosis not present

## 2023-12-15 DIAGNOSIS — S2220XA Unspecified fracture of sternum, initial encounter for closed fracture: Secondary | ICD-10-CM | POA: Diagnosis not present

## 2023-12-15 DIAGNOSIS — Z7952 Long term (current) use of systemic steroids: Secondary | ICD-10-CM | POA: Diagnosis not present

## 2023-12-15 DIAGNOSIS — R7401 Elevation of levels of liver transaminase levels: Secondary | ICD-10-CM | POA: Diagnosis not present

## 2023-12-15 DIAGNOSIS — G8929 Other chronic pain: Secondary | ICD-10-CM | POA: Diagnosis not present

## 2023-12-17 ENCOUNTER — Other Ambulatory Visit: Payer: Self-pay | Admitting: Nurse Practitioner

## 2023-12-20 ENCOUNTER — Encounter: Payer: Self-pay | Admitting: Internal Medicine

## 2023-12-20 NOTE — Telephone Encounter (Signed)
 Medical Buy and Annette Stable - Prior Authorization NOT required for [employer]

## 2023-12-20 NOTE — Telephone Encounter (Signed)
 Medical Buy and Zell  Patient is ready for scheduling on or after 01/01/24  Out-of-pocket cost due at time of visit: $377.56  Primary: [place]  Medicare Prolia  co-insurance: 20% (approximately $352.56) Admin fee co-insurance: 20% (approximately $25)  Deductible: $257 of $257 met  Prior Auth: NOT required  Secondary: AARP Medicare Supplement Plan F Prolia  co-insurance: Covers Medicare Part B co-insurance Admin fee co-insurance: Covers Medicare Part B co-insurance  Deductible: does not apply  Prior Auth: NOT required PA# Valid:   ** This summary of benefits is an estimation of the patient's out-of-pocket cost. Exact cost may vary based on individual plan coverage.

## 2023-12-20 NOTE — Telephone Encounter (Signed)
 Patient states that due to the cost he does not want to continue the Prolia  injection.

## 2023-12-21 ENCOUNTER — Telehealth: Payer: Self-pay

## 2023-12-21 NOTE — Telephone Encounter (Signed)
 Copied from CRM #[id number]. Topic: Clinical - Medication Question >> Dec 21, 2023  9:46 AM Robinson H wrote: Reason for CRM: Patient is calling in regards to message he sent to provider yesterday regarding the Prolia  injection. Wants to know if office can assist him with injection instead of getting it done at Endocrinologist and wants Dr. Geofm to take over not going back to see Endocrinologist.  Ron [phone]

## 2023-12-21 NOTE — Telephone Encounter (Signed)
 We can take over Prolia  injections as long as he does not need to go see his endocrinologist for anything else.

## 2023-12-21 NOTE — Telephone Encounter (Signed)
 UPDATED for PROLIA   Medical Buy and Zell   Patient is ready for scheduling on or after 01/01/24   Out-of-pocket cost due at time of visit: $0   Primary: [place]  Medicare Prolia  co-insurance: 20% (approximately $352.56) Admin fee co-insurance: 20% (approximately $25)   Deductible: $257 of $257 met   Prior Auth: NOT required   Secondary: AARP Medicare Supplement Plan F Prolia  co-insurance: Covers Medicare Part B co-insurance Admin fee co-insurance: Covers Medicare Part B co-insurance   Deductible:  Covered by secondary   Prior Auth: NOT required PA# Valid:    ** This summary of benefits is an estimation of the patient's out-of-pocket cost. Exact cost may vary based on individual plan coverage.

## 2023-12-24 ENCOUNTER — Telehealth: Payer: Self-pay

## 2023-12-24 ENCOUNTER — Other Ambulatory Visit (HOSPITAL_COMMUNITY): Payer: Self-pay

## 2023-12-24 ENCOUNTER — Other Ambulatory Visit: Payer: Self-pay

## 2023-12-24 DIAGNOSIS — M810 Age-related osteoporosis without current pathological fracture: Secondary | ICD-10-CM

## 2023-12-24 MED ORDER — DENOSUMAB 60 MG/ML ~~LOC~~ SOSY: 60.0000 mg | PREFILLED_SYRINGE | Freq: Once | SUBCUTANEOUS | Status: AC

## 2023-12-24 NOTE — Telephone Encounter (Signed)
 Pt ready for scheduling for PROLIA  on or after : 12/31/23  Option# 1: Buy/Bill (Office supplied medication)  Out-of-pocket cost due at time of clinic visit: $0  Number of injection/visits approved: ---  Primary: MEDICARE Prolia  co-insurance: 0% Admin fee co-insurance: 0%  Secondary: AARP-MEDSUP Prolia  co-insurance:  Admin fee co-insurance:   Medical Benefit Details: Date Benefits were checked: 12/06/23 Deductible: $257 Met of $257 Required/ Coinsurance: 0%/ Admin Fee: 0%  Prior Auth: N/A PA# Expiration Date:   # of doses approved: ----------------------------------------------------------------------- Option# 2- Med Obtained from pharmacy:  Pharmacy benefit: Copay $--- (Paid to pharmacy) Admin Fee: --- (Pay at clinic)  Prior Auth: --- PA# Expiration Date:   # of doses approved:   If patient wants fill through the pharmacy benefit please send prescription to: ---, and include estimated need by date in rx notes. Pharmacy will ship medication directly to the office.  Patient NOT eligible for Prolia  Copay Card. Copay Card can make patient's cost as little as $25. Link to apply: [email or web address]://[email or web address].[email or web address]/copay  ** This summary of benefits is an estimation of the patient's out-of-pocket cost. Exact cost may very based on individual plan coverage.

## 2023-12-24 NOTE — Telephone Encounter (Signed)
 Maxwell Fox

## 2023-12-28 ENCOUNTER — Other Ambulatory Visit: Payer: Self-pay

## 2023-12-28 DIAGNOSIS — I10 Essential (primary) hypertension: Secondary | ICD-10-CM

## 2023-12-28 LAB — BASIC METABOLIC PANEL WITH GFR
BUN/Creatinine Ratio: 15 (ref 10–24)
BUN: 20 mg/dL (ref 8–27)
CO2: 23 mmol/L (ref 20–29)
Calcium: 9.4 mg/dL (ref 8.6–10.2)
Chloride: 101 mmol/L (ref 96–106)
Creatinine, Ser: 1.3 mg/dL — ABNORMAL HIGH (ref 0.76–1.27)
Glucose: 88 mg/dL (ref 70–99)
Potassium: 5.3 mmol/L — ABNORMAL HIGH (ref 3.5–5.2)
Sodium: 137 mmol/L (ref 134–144)
eGFR: 56 mL/min/1.73 — ABNORMAL LOW

## 2023-12-29 ENCOUNTER — Ambulatory Visit: Payer: Medicare Other | Admitting: Internal Medicine

## 2023-12-29 ENCOUNTER — Ambulatory Visit: Payer: Self-pay | Admitting: Cardiology

## 2023-12-31 NOTE — Telephone Encounter (Signed)
 UPDATED for PROLIA    Medical Buy and Bill   Patient is ready for scheduling on or after 01/01/24   Out-of-pocket cost due at time of visit: $0   Primary: [place]  Medicare Prolia  co-insurance: 20% (approximately $352.56) Admin fee co-insurance: 20% (approximately $25)   Deductible: $257 of $257 met   Prior Auth: NOT required   Secondary: AARP Medicare Supplement Plan F Prolia  co-insurance: Covers Medicare Part B co-insurance Admin fee co-insurance: Covers Medicare Part B co-insurance   Deductible:  Covered by secondary   Prior Auth: NOT required PA# Valid:    ** This summary of benefits is an estimation of the patient's out-of-pocket cost. Exact cost may vary based on individual plan coverage.              12/20/23 12:09 PM You routed this conversation to [facility]

## 2024-01-02 ENCOUNTER — Encounter: Payer: Self-pay | Admitting: Internal Medicine

## 2024-01-03 ENCOUNTER — Other Ambulatory Visit: Payer: Self-pay

## 2024-01-03 MED ORDER — LEVOTHYROXINE SODIUM 88 MCG PO TABS: ORAL_TABLET | ORAL | 3 refills | Status: AC

## 2024-01-09 NOTE — Telephone Encounter (Signed)
Pt archived in [email or web address].  Please advise if patient and/or provider wish to proceed with Prolia therpay.  

## 2024-01-12 ENCOUNTER — Ambulatory Visit (HOSPITAL_COMMUNITY)
Admission: RE | Admit: 2024-01-12 | Discharge: 2024-01-12 | Disposition: A | Discharge status: Home / Self Care | Source: Ambulatory Visit | Attending: Cardiovascular Disease | Admitting: Cardiovascular Disease

## 2024-01-12 DIAGNOSIS — I2510 Atherosclerotic heart disease of native coronary artery without angina pectoris: Secondary | ICD-10-CM | POA: Diagnosis present

## 2024-01-12 DIAGNOSIS — E273 Drug-induced adrenocortical insufficiency: Secondary | ICD-10-CM | POA: Diagnosis present

## 2024-01-12 DIAGNOSIS — I5020 Unspecified systolic (congestive) heart failure: Secondary | ICD-10-CM

## 2024-01-12 DIAGNOSIS — R0609 Other forms of dyspnea: Secondary | ICD-10-CM | POA: Diagnosis present

## 2024-01-12 DIAGNOSIS — I493 Ventricular premature depolarization: Secondary | ICD-10-CM | POA: Diagnosis present

## 2024-01-12 LAB — ECHOCARDIOGRAM COMPLETE
AR max vel: 2.88 cm2
AV Area VTI: 2.66 cm2
AV Area mean vel: 2.67 cm2
AV Mean grad: 3.5 mmHg
AV Peak grad: 6.1 mmHg
Ao pk vel: 1.23 m/s
Area-P 1/2: 3.24 cm2
S' Lateral: 2.75 cm

## 2024-01-13 ENCOUNTER — Encounter: Payer: Self-pay | Admitting: Cardiovascular Disease

## 2024-01-19 NOTE — Telephone Encounter (Signed)
 Patient has reviewed MyChart message.   Last read by Maxwell Fox at 9:14AM on 12/30/2023.

## 2024-01-22 ENCOUNTER — Other Ambulatory Visit: Payer: Self-pay | Admitting: Internal Medicine

## 2024-01-25 ENCOUNTER — Encounter: Payer: Self-pay | Admitting: Internal Medicine

## 2024-01-25 ENCOUNTER — Telehealth: Payer: Self-pay

## 2024-01-25 ENCOUNTER — Other Ambulatory Visit: Payer: Self-pay | Admitting: Family

## 2024-01-25 ENCOUNTER — Other Ambulatory Visit: Payer: Self-pay | Admitting: Internal Medicine

## 2024-01-25 DIAGNOSIS — F419 Anxiety disorder, unspecified: Secondary | ICD-10-CM

## 2024-01-25 MED ORDER — CLONAZEPAM 1 MG PO TABS: 1.0000 mg | ORAL_TABLET | Freq: Two times a day (BID) | ORAL | 0 refills | Status: DC | PRN

## 2024-01-25 NOTE — Telephone Encounter (Signed)
 Copied from CRM #[id number]. Topic: Clinical - Prescription Issue >> Jan 25, 2024  8:23 AM Robinson H wrote: Reason for CRM: Patient checking status of refill request for his clonazePAM  (KLONOPIN ) 1 MG tablet, states he's been out for 2 days and not supposed to go off of medication abruptly. Refill request pending as of 12/20.  [place] [phone]

## 2024-02-19 ENCOUNTER — Encounter: Payer: Self-pay | Admitting: Internal Medicine

## 2024-02-19 DIAGNOSIS — F32A Depression, unspecified: Secondary | ICD-10-CM

## 2024-02-19 MED ORDER — CLONAZEPAM 1 MG PO TABS: 1.0000 mg | ORAL_TABLET | Freq: Two times a day (BID) | ORAL | 0 refills | Status: AC | PRN

## 2024-02-22 ENCOUNTER — Telehealth: Payer: Self-pay

## 2024-02-22 NOTE — Telephone Encounter (Signed)
 Copied from CRM [id number]. Topic: Clinical - Prescription Issue >> Feb 22, 2024 12:03 PM Jasmin G wrote: Reason for CRM: Pt requested for prednisone  1mg  prescription to be corrected as he's supposed to take it twice a day and get a 90 day supply, please refer to recent Encounters for more info. Pt requested for prescription to be sent to pharmacy ASAP as he cannot go without it and he will be out tomorrow, and requested to be notified via phone call at [phone] once prescription has been sent.

## 2024-02-22 NOTE — Telephone Encounter (Signed)
 Patient has sent multiple mychart messages and they have been sent to Dr. Geofm.  Patient informed via mychart that messages have been received and she will send in medication when she gets to messages.

## 2024-02-23 MED ORDER — PREDNISONE 5 MG PO TABS: ORAL_TABLET | ORAL | 3 refills | Status: AC

## 2024-02-23 NOTE — Addendum Note (Signed)
 Addended by: GEOFM GLADE PARAS on: 02/23/2024 07:11 AM   Modules accepted: Orders

## 2024-03-02 ENCOUNTER — Ambulatory Visit: Admitting: Cardiology

## 2024-03-06 ENCOUNTER — Other Ambulatory Visit: Payer: Self-pay | Admitting: Nurse Practitioner

## 2024-03-07 ENCOUNTER — Telehealth: Payer: Self-pay

## 2024-03-07 ENCOUNTER — Ambulatory Visit: Admitting: Emergency Medicine

## 2024-03-08 NOTE — Telephone Encounter (Signed)
 Patient requested another callback via mychart due to missing the call.   Please advise.

## 2024-03-08 NOTE — Telephone Encounter (Signed)
 Left message for patient. Reminded to keep appt scheduled for tomorrow with APP.  MyChart message also sent.

## 2024-03-08 NOTE — Telephone Encounter (Signed)
 BMP on 12/28/23 Outside Normal Range and CBC on 11/29/23 Outside Normal Range  In accordance with refill protocols, please review and address the following requirements before this medication refill can be authorized:  Labs

## 2024-03-09 ENCOUNTER — Encounter: Payer: Self-pay | Admitting: Cardiology

## 2024-03-09 ENCOUNTER — Ambulatory Visit: Admitting: Cardiology

## 2024-03-09 ENCOUNTER — Emergency Department (HOSPITAL_COMMUNITY)

## 2024-03-09 ENCOUNTER — Other Ambulatory Visit: Payer: Self-pay

## 2024-03-09 ENCOUNTER — Observation Stay (HOSPITAL_COMMUNITY)
Admission: EM | Admit: 2024-03-09 | Source: Ambulatory Visit | Attending: Emergency Medicine | Admitting: Emergency Medicine

## 2024-03-09 ENCOUNTER — Encounter (HOSPITAL_COMMUNITY): Payer: Self-pay

## 2024-03-09 VITALS — BP 124/76 | HR 88 | Ht 71.0 in | Wt 199.0 lb

## 2024-03-09 DIAGNOSIS — I2510 Atherosclerotic heart disease of native coronary artery without angina pectoris: Secondary | ICD-10-CM

## 2024-03-09 DIAGNOSIS — E273 Drug-induced adrenocortical insufficiency: Secondary | ICD-10-CM | POA: Diagnosis present

## 2024-03-09 DIAGNOSIS — R0609 Other forms of dyspnea: Secondary | ICD-10-CM

## 2024-03-09 DIAGNOSIS — N183 Chronic kidney disease, stage 3 unspecified: Secondary | ICD-10-CM | POA: Insufficient documentation

## 2024-03-09 DIAGNOSIS — I10 Essential (primary) hypertension: Secondary | ICD-10-CM

## 2024-03-09 DIAGNOSIS — N400 Enlarged prostate without lower urinary tract symptoms: Secondary | ICD-10-CM | POA: Diagnosis present

## 2024-03-09 DIAGNOSIS — F32A Depression, unspecified: Secondary | ICD-10-CM

## 2024-03-09 DIAGNOSIS — I493 Ventricular premature depolarization: Secondary | ICD-10-CM

## 2024-03-09 DIAGNOSIS — I5022 Chronic systolic (congestive) heart failure: Secondary | ICD-10-CM | POA: Diagnosis present

## 2024-03-09 DIAGNOSIS — I5020 Unspecified systolic (congestive) heart failure: Secondary | ICD-10-CM

## 2024-03-09 DIAGNOSIS — C7931 Secondary malignant neoplasm of brain: Secondary | ICD-10-CM | POA: Diagnosis present

## 2024-03-09 DIAGNOSIS — F419 Anxiety disorder, unspecified: Secondary | ICD-10-CM

## 2024-03-09 DIAGNOSIS — Z9884 Bariatric surgery status: Secondary | ICD-10-CM

## 2024-03-09 DIAGNOSIS — E039 Hypothyroidism, unspecified: Secondary | ICD-10-CM | POA: Diagnosis present

## 2024-03-09 DIAGNOSIS — R0602 Shortness of breath: Principal | ICD-10-CM

## 2024-03-09 LAB — TROPONIN T, HIGH SENSITIVITY
Troponin T High Sensitivity: 21 ng/L — ABNORMAL HIGH (ref 0–19)
Troponin T High Sensitivity: 24 ng/L — ABNORMAL HIGH (ref 0–19)

## 2024-03-09 LAB — CBC WITH DIFFERENTIAL/PLATELET
Abs Immature Granulocytes: 0.03 10*3/uL (ref 0.00–0.07)
Basophils Absolute: 0 10*3/uL (ref 0.0–0.1)
Basophils Relative: 1 %
Eosinophils Absolute: 0 10*3/uL (ref 0.0–0.5)
Eosinophils Relative: 0 %
HCT: 43.5 % (ref 39.0–52.0)
Hemoglobin: 13.1 g/dL (ref 13.0–17.0)
Immature Granulocytes: 0 %
Lymphocytes Relative: 14 %
Lymphs Abs: 1.1 10*3/uL (ref 0.7–4.0)
MCH: 25.3 pg — ABNORMAL LOW (ref 26.0–34.0)
MCHC: 30.1 g/dL (ref 30.0–36.0)
MCV: 84 fL (ref 80.0–100.0)
Monocytes Absolute: 0.6 10*3/uL (ref 0.1–1.0)
Monocytes Relative: 7 %
Neutro Abs: 6.1 10*3/uL (ref 1.7–7.7)
Neutrophils Relative %: 78 %
Platelets: 205 10*3/uL (ref 150–400)
RBC: 5.18 MIL/uL (ref 4.22–5.81)
RDW: 18.3 % — ABNORMAL HIGH (ref 11.5–15.5)
WBC: 7.8 10*3/uL (ref 4.0–10.5)
nRBC: 0 % (ref 0.0–0.2)

## 2024-03-09 LAB — PRO BRAIN NATRIURETIC PEPTIDE: Pro Brain Natriuretic Peptide: 2199 pg/mL — ABNORMAL HIGH

## 2024-03-09 LAB — URINALYSIS, ROUTINE W REFLEX MICROSCOPIC
Bacteria, UA: NONE SEEN
Bilirubin Urine: NEGATIVE
Glucose, UA: >=500 mg/dL — AB
Hgb urine dipstick: NEGATIVE
Ketones, ur: 20 mg/dL — AB
Leukocytes,Ua: NEGATIVE
Nitrite: NEGATIVE
Protein, ur: NEGATIVE mg/dL
Specific Gravity, Urine: 1.031 — ABNORMAL HIGH (ref 1.005–1.030)
pH: 5 (ref 5.0–8.0)

## 2024-03-09 LAB — RESP PANEL BY RT-PCR (RSV, FLU A&B, COVID)  RVPGX2
Influenza A by PCR: NEGATIVE
Influenza B by PCR: NEGATIVE
Resp Syncytial Virus by PCR: NEGATIVE
SARS Coronavirus 2 by RT PCR: NEGATIVE

## 2024-03-09 LAB — COMPREHENSIVE METABOLIC PANEL WITH GFR
ALT: 20 U/L (ref 0–44)
AST: 30 U/L (ref 15–41)
Albumin: 4 g/dL (ref 3.5–5.0)
Alkaline Phosphatase: 71 U/L (ref 38–126)
Anion gap: 17 — ABNORMAL HIGH (ref 5–15)
BUN: 18 mg/dL (ref 8–23)
CO2: 19 mmol/L — ABNORMAL LOW (ref 22–32)
Calcium: 9.7 mg/dL (ref 8.9–10.3)
Chloride: 103 mmol/L (ref 98–111)
Creatinine, Ser: 1.51 mg/dL — ABNORMAL HIGH (ref 0.61–1.24)
GFR, Estimated: 47 mL/min — ABNORMAL LOW
Glucose, Bld: 103 mg/dL — ABNORMAL HIGH (ref 70–99)
Potassium: 4.4 mmol/L (ref 3.5–5.1)
Sodium: 138 mmol/L (ref 135–145)
Total Bilirubin: 1.2 mg/dL (ref 0.0–1.2)
Total Protein: 6.7 g/dL (ref 6.5–8.1)

## 2024-03-09 LAB — I-STAT VENOUS BLOOD GAS, ED
Acid-base deficit: 1 mmol/L (ref 0.0–2.0)
Bicarbonate: 22.8 mmol/L (ref 20.0–28.0)
Calcium, Ion: 1.14 mmol/L — ABNORMAL LOW (ref 1.15–1.40)
HCT: 36 % — ABNORMAL LOW (ref 39.0–52.0)
Hemoglobin: 12.2 g/dL — ABNORMAL LOW (ref 13.0–17.0)
O2 Saturation: 95 %
Potassium: 4.4 mmol/L (ref 3.5–5.1)
Sodium: 139 mmol/L (ref 135–145)
TCO2: 24 mmol/L (ref 22–32)
pCO2, Ven: 34.8 mmHg — ABNORMAL LOW (ref 44–60)
pH, Ven: 7.423 (ref 7.25–7.43)
pO2, Ven: 71 mmHg — ABNORMAL HIGH (ref 32–45)

## 2024-03-09 LAB — T4, FREE: Free T4: 1.96 ng/dL (ref 0.80–2.00)

## 2024-03-09 LAB — TSH: TSH: 0.233 u[IU]/mL — ABNORMAL LOW (ref 0.350–4.500)

## 2024-03-09 LAB — I-STAT CG4 LACTIC ACID, ED: Lactic Acid, Venous: 1.8 mmol/L (ref 0.5–1.9)

## 2024-03-09 LAB — CREATININE, SERUM
Creatinine, Ser: 1.34 mg/dL — ABNORMAL HIGH (ref 0.61–1.24)
GFR, Estimated: 54 mL/min — ABNORMAL LOW

## 2024-03-09 LAB — D-DIMER, QUANTITATIVE: D-Dimer, Quant: 1.77 ug{FEU}/mL — ABNORMAL HIGH (ref 0.00–0.50)

## 2024-03-09 MED ORDER — CLONAZEPAM 0.5 MG PO TABS: 1.0000 mg | ORAL_TABLET | Freq: Two times a day (BID) | ORAL | Status: AC | PRN

## 2024-03-09 MED ORDER — LEVOTHYROXINE SODIUM 88 MCG PO TABS
88.0000 ug | ORAL_TABLET | Freq: Every day | ORAL | Status: AC
  Administered 2024-03-10: 88 ug via ORAL
  Filled 2024-03-09: qty 1

## 2024-03-09 MED ORDER — PREDNISONE 5 MG PO TABS
5.0000 mg | ORAL_TABLET | Freq: Every day | ORAL | Status: DC
  Administered 2024-03-10: 5 mg via ORAL
  Filled 2024-03-09: qty 1

## 2024-03-09 MED ORDER — DULOXETINE HCL 30 MG PO CPEP: 60.0000 mg | ORAL_CAPSULE | Freq: Every day | ORAL | Status: DC

## 2024-03-09 MED ORDER — SODIUM CHLORIDE 0.9 % IV BOLUS
500.0000 mL | Freq: Once | INTRAVENOUS | Status: AC
  Administered 2024-03-09: 500 mL via INTRAVENOUS

## 2024-03-09 MED ORDER — SIMVASTATIN 20 MG PO TABS
20.0000 mg | ORAL_TABLET | Freq: Every day | ORAL | Status: AC
  Administered 2024-03-10 (×2): 20 mg via ORAL
  Filled 2024-03-09 (×2): qty 1

## 2024-03-09 MED ORDER — ALBUTEROL SULFATE (2.5 MG/3ML) 0.083% IN NEBU: 2.5000 mg | INHALATION_SOLUTION | Freq: Four times a day (QID) | RESPIRATORY_TRACT | Status: AC | PRN

## 2024-03-09 MED ORDER — CLOPIDOGREL BISULFATE 75 MG PO TABS
75.0000 mg | ORAL_TABLET | Freq: Every day | ORAL | Status: AC
  Administered 2024-03-10: 75 mg via ORAL
  Filled 2024-03-09: qty 1

## 2024-03-09 MED ORDER — FINASTERIDE 5 MG PO TABS
5.0000 mg | ORAL_TABLET | Freq: Every day | ORAL | Status: AC
  Administered 2024-03-10: 5 mg via ORAL
  Filled 2024-03-09: qty 1

## 2024-03-09 MED ORDER — TAMSULOSIN HCL 0.4 MG PO CAPS
0.4000 mg | ORAL_CAPSULE | Freq: Every day | ORAL | Status: AC
  Administered 2024-03-10: 0.4 mg via ORAL
  Filled 2024-03-09: qty 1

## 2024-03-09 MED ORDER — PANTOPRAZOLE SODIUM 40 MG PO TBEC
40.0000 mg | DELAYED_RELEASE_TABLET | Freq: Every day | ORAL | Status: AC
  Administered 2024-03-10: 40 mg via ORAL
  Filled 2024-03-09: qty 1

## 2024-03-09 MED ORDER — IOHEXOL 350 MG/ML SOLN
75.0000 mL | Freq: Once | INTRAVENOUS | Status: AC | PRN
  Administered 2024-03-09: 75 mL via INTRAVENOUS

## 2024-03-09 MED ORDER — ENOXAPARIN SODIUM 40 MG/0.4ML IJ SOSY
40.0000 mg | PREFILLED_SYRINGE | INTRAMUSCULAR | Status: AC
  Filled 2024-03-09: qty 0.4

## 2024-03-09 NOTE — Patient Instructions (Signed)
 Medication Instructions:  Your physician recommends that you continue on your current medications as directed. Please refer to the Current Medication list given to you today.  *If you need a refill on your cardiac medications before your next appointment, please call your pharmacy*  Lab Work: NONE If you have labs (blood work) drawn today and your tests are completely normal, you will receive your results only by: MyChart Message (if you have MyChart) OR A paper copy in the mail If you have any lab test that is abnormal or we need to change your treatment, we will call you to review the results.  Testing/Procedures: NONE  Follow-Up: At [facility], you and your health needs are our priority.  As part of our continuing mission to provide you with exceptional heart care, our providers are all part of one team.  This team includes your primary Cardiologist (physician) and Advanced Practice Providers or APPs (Physician Assistants and Nurse Practitioners) who all work together to provide you with the care you need, when you need it.    Other Instructions Report to the Emergency Room

## 2024-03-09 NOTE — ED Provider Notes (Signed)
 " [facility] EMERGENCY DEPARTMENT AT [facility] Provider Note   CSN: [id number] Arrival date & time: 03/09/24  1022     Patient presents with: Shortness of Breath   Maxwell Fox is a 79 y.o. male.   79 yo M with chief complaints of progressive shortness of breath on exertion.  He tells me this been going on for a couple months.  Worsening over the past couple days.  Tells me he can barely get up and walk without being very short of breath.  He denies cough congestion or fever.  Denies chest pain or pressure.  He has some lower extremity edema but feels like that is at his baseline.  He denies orthopnea or PND.  Denies abdominal pain.  Feels like he has been eating and drinking normally.  History of metastatic melanoma.  He is reportedly in remission.   Shortness of Breath      Prior to Admission medications  Medication Sig Start Date End Date Taking? Authorizing Provider  acetaminophen  (TYLENOL ) 325 MG tablet Take 2 tablets (650 mg total) by mouth every 6 (six) hours as needed for moderate pain. 04/01/20   Arrien, Elidia Sieving, MD  calcium  carbonate (OS-CAL - DOSED IN MG OF ELEMENTAL CALCIUM ) 1250 (500 Ca) MG tablet Take 1 tablet by mouth daily with supper.    [provider]  Cholecalciferol (VITAMIN D ) 2000 UNITS tablet Take 1 tablet (2,000 Units total) by mouth daily. 10/26/13   Inocencio Berwyn LABOR, MD  clonazePAM  (KLONOPIN ) 1 MG tablet Take 1 tablet (1 mg total) by mouth 2 (two) times daily as needed for anxiety. 02/19/24   Geofm Glade PARAS, MD  clopidogrel  (PLAVIX ) 75 MG tablet TAKE 1 TABLET(75 MG) BY MOUTH DAILY WITH BREAKFAST 12/10/23   Carlin Delon BROCKS, NP  DULoxetine  (CYMBALTA ) 60 MG capsule Take 1 capsule (60 mg total) by mouth daily. 09/20/23   Geofm Glade PARAS, MD  empagliflozin  (JARDIANCE ) 10 MG TABS tablet Take 1 tablet (10 mg total) by mouth daily before breakfast. 11/30/23   Carlin Delon BROCKS, NP  finasteride  (PROSCAR ) 5 MG tablet TAKE 1 TABLET(5 MG) BY  MOUTH DAILY 11/29/23   Geofm Glade PARAS, MD  furosemide  (LASIX ) 20 MG tablet Take 1 tablet (20 mg total) by mouth daily. Take daily for 3 days then as needed for weight gain of 3 lbs in a day 11/30/23   Carlin Delon BROCKS, NP  levothyroxine  (SYNTHROID ) 88 MCG tablet TAKE 1 TABLET BY MOUTH EVERY DAY BEFORE BREAKFAST 01/03/24   Geofm Glade PARAS, MD  Multiple Vitamin (MULTIVITAMIN WITH MINERALS) TABS tablet Take 1 tablet by mouth in the morning.    [provider]  nitroGLYCERIN  (NITROSTAT ) 0.4 MG SL tablet Place 1 tablet (0.4 mg total) under the tongue every 5 (five) minutes as needed. 11/30/23   Carlin Delon BROCKS, NP  pantoprazole  (PROTONIX ) 40 MG tablet Take 1 tablet (40 mg total) by mouth daily. 12/20/23   Verlin Lonni BIRCH, MD  predniSONE  (DELTASONE ) 5 MG tablet TAKE 1 TABLET BY MOUTH EVERY DAY WITH BREAKFAST 02/23/24   Geofm Glade PARAS, MD  simvastatin  (ZOCOR ) 20 MG tablet Take 1 tablet (20 mg total) by mouth at bedtime. 11/30/23   Carlin Delon BROCKS, NP  tamsulosin  (FLOMAX ) 0.4 MG CAPS capsule TAKE 1 CAPSULE BY MOUTH EVERY DAY AFTER SUPPER 09/20/23   Geofm Glade PARAS, MD    Allergies: Rosuvastatin     Review of Systems  Respiratory:  Positive for shortness of breath.  Updated Vital Signs BP (!) 127/91   Pulse 81   Temp 98.1 F (36.7 C) (Oral)   Resp 18   Ht 5' 11 (1.803 m)   Wt 89.8 kg   SpO2 100%   BMI 27.62 kg/m   Physical Exam Vitals and nursing note reviewed.  Constitutional:      Appearance: He is well-developed.     Comments: Cachectic, temporal muscle wasting  HENT:     Head: Normocephalic and atraumatic.  Eyes:     Pupils: Pupils are equal, round, and reactive to light.  Neck:     Vascular: No JVD.  Cardiovascular:     Rate and Rhythm: Normal rate and regular rhythm.     Heart sounds: No murmur heard.    No friction rub. No gallop.  Pulmonary:     Effort: No respiratory distress.     Breath sounds: No wheezing.  Abdominal:     General: There is no  distension.     Tenderness: There is no abdominal tenderness. There is no guarding or rebound.  Musculoskeletal:        General: Normal range of motion.     Cervical back: Normal range of motion and neck supple.     Right lower leg: Edema present.     Left lower leg: Edema present.     Comments: 3+ lower extremity edema up to the thighs  Skin:    Coloration: Skin is not pale.     Findings: No rash.  Neurological:     Mental Status: He is alert and oriented to person, place, and time.  Psychiatric:        Behavior: Behavior normal.     (all labs ordered are listed, but only abnormal results are displayed) Labs Reviewed  COMPREHENSIVE METABOLIC PANEL WITH GFR - Abnormal; Notable for the following components:      Result Value   CO2 19 (*)    Glucose, Bld 103 (*)    Creatinine, Ser 1.51 (*)    GFR, Estimated 47 (*)    Anion gap 17 (*)    All other components within normal limits  CBC WITH DIFFERENTIAL/PLATELET - Abnormal; Notable for the following components:   MCH 25.3 (*)    RDW 18.3 (*)    All other components within normal limits  PRO BRAIN NATRIURETIC PEPTIDE - Abnormal; Notable for the following components:   Pro Brain Natriuretic Peptide 2,199.0 (*)    All other components within normal limits  D-DIMER, QUANTITATIVE - Abnormal; Notable for the following components:   D-Dimer, Quant 1.77 (*)    All other components within normal limits  URINALYSIS, ROUTINE W REFLEX MICROSCOPIC - Abnormal; Notable for the following components:   Specific Gravity, Urine 1.031 (*)    Glucose, UA >=500 (*)    Ketones, ur 20 (*)    All other components within normal limits  I-STAT VENOUS BLOOD GAS, ED - Abnormal; Notable for the following components:   pCO2, Ven 34.8 (*)    pO2, Ven 71 (*)    Calcium , Ion 1.14 (*)    HCT 36.0 (*)    Hemoglobin 12.2 (*)    All other components within normal limits  TROPONIN T, HIGH SENSITIVITY - Abnormal; Notable for the following components:    Troponin T High Sensitivity 21 (*)    All other components within normal limits  RESP PANEL BY RT-PCR (RSV, FLU A&B, COVID)  RVPGX2  TSH  T4, FREE  CORTISOL  CREATININE, SERUM  COMPREHENSIVE METABOLIC PANEL WITH GFR  CBC  CBC  I-STAT CG4 LACTIC ACID, ED  TROPONIN T, HIGH SENSITIVITY    EKG: EKG Interpretation Date/Time:  Thursday March 09 2024 11:15:41 EST Ventricular Rate:  88 PR Interval:  206 QRS Duration:  114 QT Interval:  364 QTC Calculation: 440 R Axis:   -15  Text Interpretation: Normal sinus rhythm Left ventricular hypertrophy with repolarization abnormality ( R in aVL , Cornell product ) Abnormal ECG No significant change since last tracing Confirmed by Emil Share [phone]) on 03/09/2024 4:53:56 PM  Radiology: CT Angio Chest PE W and/or Wo Contrast Result Date: 03/09/2024 CLINICAL DATA:  Short of breath, positive D-dimer, history of melanoma EXAM: CT ANGIOGRAPHY CHEST WITH CONTRAST TECHNIQUE: Multidetector CT imaging of the chest was performed using the standard protocol during bolus administration of intravenous contrast. Multiplanar CT image reconstructions and MIPs were obtained to evaluate the vascular anatomy. RADIATION DOSE REDUCTION: This exam was performed according to the departmental dose-optimization program which includes automated exposure control, adjustment of the mA and/or kV according to patient size and/or use of iterative reconstruction technique. CONTRAST:  75mL OMNIPAQUE  IOHEXOL  350 MG/ML SOLN COMPARISON:  05/20/2022, 03/09/2024 FINDINGS: Cardiovascular: This is a technically adequate evaluation of the pulmonary vasculature. No filling defects or pulmonary emboli. Heart is unremarkable without pericardial effusion. Mild calcifications of the aortic and mitral valves. No evidence of thoracic aortic aneurysm or dissection. Atherosclerosis of the aorta and coronary vasculature. Mediastinum/Nodes: No enlarged mediastinal, hilar, or axillary lymph nodes. Thyroid   gland, trachea, and esophagus demonstrate no significant findings. Small hiatal hernia. Lungs/Pleura: Stable basilar predominant subpleural scarring. No acute airspace disease, effusion, or pneumothorax. Central airways are patent. There is a stable 3 mm lingular nodule reference image 83/13, which can be considered benign given long-term stability and size. There are no other pulmonary nodules to correspond to the reported nodularity on chest x-ray, which likely reflected summation of shadows. Upper Abdomen: Previous bariatric surgery. Small hiatal hernia. No acute upper abdominal findings. Musculoskeletal: Since the 2014 exam, a mid sternal fracture has developed, with approximately 5 mm of ventral displacement of the distal sternal fracture fragment. A fracture line is still visible, sclerotic margins are seen along the fracture line, which is still visible. Right shoulder arthroplasty. Stable lymphadenectomy clips within the left axilla. Reconstructed images demonstrate no additional findings. Review of the MIP images confirms the above findings. IMPRESSION: 1. No evidence of pulmonary embolus. 2. No acute airspace disease. 3. Interval development of a mid sternal fracture as above, with sclerosis along the persistent fracture line. I would favor a healing subacute fracture, though a chronic nonunion cap a similar appearance. No signs of intrathoracic metastatic disease to suggest a pathologic fracture. Please correlate with physical exam findings and any history of prior trauma. 4. Small hiatal hernia, with postsurgical changes from prior bariatric surgery. 5. Aortic Atherosclerosis (ICD10-I70.0). Coronary artery atherosclerosis. Electronically Signed   By: Ozell Daring M.D.   On: 03/09/2024 17:49   DG Chest Port 1 View Result Date: 03/09/2024 CLINICAL DATA:  Shortness of breath with dizziness and fatigue. EXAM: PORTABLE CHEST 1 VIEW COMPARISON:  January 19, 2019 FINDINGS: The heart size and mediastinal  contours are within normal limits. A 2 mm nodular opacity is seen overlying the periphery of the upper left lung. No acute infiltrate, pleural effusion or pneumothorax is identified. Radiopaque surgical clips are seen along the left axilla. There is evidence of prior right shoulder arthroplasty. Chronic right rib deformities are noted. Degenerative  changes are noted throughout the thoracic spine. IMPRESSION: 1. No acute cardiopulmonary disease. 2. Findings may represent a 2 mm left upper lobe pulmonary nodule. Correlation with nonemergent chest CT is recommended. Electronically Signed   By: Suzen Dials M.D.   On: 03/09/2024 12:39     Procedures   Medications Ordered in the ED  simvastatin  (ZOCOR ) tablet 20 mg (has no administration in time range)  DULoxetine  (CYMBALTA ) DR capsule 60 mg (has no administration in time range)  levothyroxine  (SYNTHROID ) tablet 88 mcg (has no administration in time range)  predniSONE  (DELTASONE ) tablet 5 mg (has no administration in time range)  pantoprazole  (PROTONIX ) EC tablet 40 mg (has no administration in time range)  finasteride  (PROSCAR ) tablet 5 mg (has no administration in time range)  tamsulosin  (FLOMAX ) capsule 0.4 mg (has no administration in time range)  clopidogrel  (PLAVIX ) tablet 75 mg (has no administration in time range)  clonazePAM  (KLONOPIN ) tablet 1 mg (has no administration in time range)  enoxaparin  (LOVENOX ) injection 40 mg (has no administration in time range)  sodium chloride  0.9 % bolus 500 mL (0 mLs Intravenous Stopped 03/09/24 2020)  iohexol  (OMNIPAQUE ) 350 MG/ML injection 75 mL (75 mLs Intravenous Contrast Given 03/09/24 1736)  sodium chloride  0.9 % bolus 500 mL (500 mLs Intravenous New Bag/Given 03/09/24 2020)                                    Medical Decision Making Amount and/or Complexity of Data Reviewed Labs: ordered.  Risk Decision regarding hospitalization.   79 yo M with a chief complaint of shortness of breath on  exertion.  This been going on for a couple months.  Getting progressively worsened over the past couple days has felt like it has been much worse and when he stands up and tries to walk he feels like he is get a pass out.  He looks quite dehydrated on my initial exam.  He does have significant lower extremity edema that he seems to think has been there and is not worse than normal.  Will give a small bolus of IV fluids.  His BNP was somewhat elevated.  Lungs are clear for me on exam and on my independent interpretation of the chest x-ray I also do not see a focal infiltrate or pneumothorax.  Through the MSE process he had a D-dimer ordered that was positive.  He is awaiting CT angiogram of the chest.  CT angiogram of the chest without obvious acute pathology.  Troponin very minimally elevated.  Lactate normal.  I am unsure of the cause of the patient's shortness of breath on exertion.  Will discuss with cardiology.  I discussed case with Dr. Jeffrie.  He was unsure based on my history and exam and the results today that his symptoms were obviously cardiac in nature.  He was unsure if maybe this would be due to the patient's adrenal insufficiency.  Will send off thyroid  studies random cortisol level.  Will discuss with medicine.  The patients results and plan were reviewed and discussed.   Any x-rays performed were independently reviewed by myself.   Differential diagnosis were considered with the presenting HPI.  Medications  simvastatin  (ZOCOR ) tablet 20 mg (has no administration in time range)  DULoxetine  (CYMBALTA ) DR capsule 60 mg (has no administration in time range)  levothyroxine  (SYNTHROID ) tablet 88 mcg (has no administration in time range)  predniSONE  (DELTASONE ) tablet 5  mg (has no administration in time range)  pantoprazole  (PROTONIX ) EC tablet 40 mg (has no administration in time range)  finasteride  (PROSCAR ) tablet 5 mg (has no administration in time range)  tamsulosin   (FLOMAX ) capsule 0.4 mg (has no administration in time range)  clopidogrel  (PLAVIX ) tablet 75 mg (has no administration in time range)  clonazePAM  (KLONOPIN ) tablet 1 mg (has no administration in time range)  enoxaparin  (LOVENOX ) injection 40 mg (has no administration in time range)  sodium chloride  0.9 % bolus 500 mL (0 mLs Intravenous Stopped 03/09/24 2020)  iohexol  (OMNIPAQUE ) 350 MG/ML injection 75 mL (75 mLs Intravenous Contrast Given 03/09/24 1736)  sodium chloride  0.9 % bolus 500 mL (500 mLs Intravenous New Bag/Given 03/09/24 2020)    Vitals:   03/09/24 1029 03/09/24 1115 03/09/24 1922  BP: (!) 148/93  (!) 127/91  Pulse: 95  81  Resp: 18  18  Temp: (!) 97.4 F (36.3 C)  98.1 F (36.7 C)  TempSrc: Oral  Oral  SpO2: 100%  100%  Weight:  89.8 kg   Height:  5' 11 (1.803 m)     Final diagnoses:  SOBOE (shortness of breath on exertion)    Admission/ observation were discussed with the admitting physician, patient and/or family and they are comfortable with the plan.       Final diagnoses:  SOBOE (shortness of breath on exertion)    ED Discharge Orders     None          Emil Share, DO 03/09/24 2056  "

## 2024-03-09 NOTE — ED Triage Notes (Signed)
 C/O SHOB and dizziness when standing for 2 days. Denies CP,nausea, vomiting. Axox4.

## 2024-03-09 NOTE — ED Provider Triage Note (Signed)
 Emergency Medicine Provider Triage Evaluation Note  Kindred Reidinger , a 79 y.o. male  was evaluated in triage.  Pt complains of numbness of breath, fatigue, dizziness he has a history of heart failure though he notes his most recent echocardiogram was better than the prior 1.  He was at cardiology clinic today, complained of shortness of breath particular with exertion, sent here for evaluation.  Review of Systems  Positive: Shortness of breath Negative: Chest pain, syncope  Physical Exam  BP (!) 148/93   Pulse 95   Temp (!) 97.4 F (36.3 C) (Oral)   Resp 18   Ht 1.803 m (5' 11)   Wt 89.8 kg   SpO2 100%   BMI 27.62 kg/m  Gen:   Awake, no distress elderly male thin Resp:  Normal effort tachypnea MSK:   Moves extremities without difficulty no deformity Other:  Neuro grossly intact  Medical Decision Making  Medically screening exam initiated at 11:45 AM.  Appropriate orders placed.  Kyion Gautier was informed that the remainder of the evaluation will be completed by another provider, this initial triage assessment does not replace that evaluation, and the importance of remaining in the ED until their evaluation is complete.   Garrick Charleston, MD 03/09/24 1146

## 2024-03-09 NOTE — ED Notes (Signed)
 Patient transported to CT

## 2024-03-09 NOTE — H&P (Incomplete)
 " History and Physical    Maxwell Fox FMW:[id number] DOB: 1946-01-21 DOA: 03/09/2024  Patient coming from: Home.  Chief Complaint: Exertional dyspnea.  HPI: Maxwell Fox is a 79 y.o. male with history of metastatic melanoma with history of craniotomy being followed at oncology at [facility] presently under surveillance, CAD status post PTCA/DES to proximal LAD in 2024, chronic HFrEF last EF measured was 55 to 60% on December 2025, central adrenal insufficiency and hypothyroidism related to treatment of melanoma has been experiencing exertional dyspnea with fatigue over the last 1 month.  Patient states that last few days he has become extremely short of breath with minimal exertion that he is not hardly able to do any of his daily activities now.  Denies any chest pain fever chills nausea vomiting or diarrhea.  Had followed up with cardiology today and due to his symptoms was referred to the ER.  Patient states he has been compliant with his prednisone  Synthroid .  Has not been on any diuretics since October 2025 due to patient not noticing any edema.  ED Course: In ER patient appears nonfocal.  CT angiogram of the chest is negative for PE but did show some mid sternal fracture.  proBNP is 2100 and troponins were 21 and 24.  Creatinine 1.5 hemoglobin 13.1 bicarb 19.  EKG shows normal sinus rhythm.  UA is negative for LE and nitrites.  Patient was given 500 cc normal saline bolus.  ER physician discussed with cardiologist.  Patient admitted for further observation.  Review of Systems: As per HPI, rest all negative.   Past Medical History:  Diagnosis Date   B12 nutritional deficiency    malabsorbtion   Blood transfusion without reported diagnosis 2022   BPH (benign prostatic hypertrophy)    Brain cancer (HCC) 2017   removed golf ball sized mass   Depression    on meds   Diverticulosis    GERD (gastroesophageal reflux disease)    on meds   GI bleeding 02/2020   Hypertension     Iron  deficiency anemia    malabsorption related (s/p gastric bypass)   Melanoma (HCC)    Obesity    s/p gastric bypass 2000, start 355#   Pancreatic cyst    Pulmonary nodules    Thyroid  disease    on meds    Past Surgical History:  Procedure Laterality Date   CHOLECYSTECTOMY  2000   COLONOSCOPY WITH PROPOFOL  N/A 03/05/2020   Procedure: COLONOSCOPY WITH PROPOFOL ;  Surgeon: Wilhelmenia Aloha Raddle., MD;  Location: [facility] ENDOSCOPY;  Service: Gastroenterology;  Laterality: N/A;   CORONARY STENT INTERVENTION N/A 08/13/2022   Procedure: CORONARY STENT INTERVENTION;  Surgeon: Verlin Lonni BIRCH, MD;  Location: [facility] INVASIVE CV LAB;  Service: Cardiovascular;  Laterality: N/A;   CRANIOTOMY FOR TUMOR  12/11/2015   metastatic melanoma   ENTEROSCOPY N/A 03/30/2020   Procedure: ENTEROSCOPY;  Surgeon: Abran Norleen SAILOR, MD;  Location: [facility] ENDOSCOPY;  Service: Endoscopy;  Laterality: N/A;   ESOPHAGOGASTRODUODENOSCOPY N/A 03/30/2020   Procedure: ESOPHAGOGASTRODUODENOSCOPY (EGD);  Surgeon: Abran Norleen SAILOR, MD;  Location: [facility]OSCOPY;  Service: Endoscopy;  Laterality: N/A;   ESOPHAGOGASTRODUODENOSCOPY (EGD) WITH PROPOFOL  N/A 02/14/2020   Procedure: ESOPHAGOGASTRODUODENOSCOPY (EGD) WITH PROPOFOL ;  Surgeon: San Sandor GAILS, DO;  Location: [facility] ENDOSCOPY;  Service: Endoscopy;  Laterality: N/A;   ESOPHAGOGASTRODUODENOSCOPY (EGD) WITH PROPOFOL  N/A 02/26/2020   Procedure: ESOPHAGOGASTRODUODENOSCOPY (EGD) WITH PROPOFOL ;  Surgeon: Abran Norleen SAILOR, MD;  Location: [facility] ENDOSCOPY;  Service: Endoscopy;  Laterality: N/A;   ESOPHAGOGASTRODUODENOSCOPY (EGD) WITH  PROPOFOL  N/A 03/03/2020   Procedure: ESOPHAGOGASTRODUODENOSCOPY (EGD) WITH PROPOFOL ;  Surgeon: Rollin Dover, MD;  Location: [facility] ENDOSCOPY;  Service: Endoscopy;  Laterality: N/A;   ESOPHAGOGASTRODUODENOSCOPY (EGD) WITH PROPOFOL  N/A 03/28/2020   Procedure: ESOPHAGOGASTRODUODENOSCOPY (EGD) WITH PROPOFOL ;  Surgeon: Abran Norleen SAILOR, MD;  Location: [facility] ENDOSCOPY;  Service: Endoscopy;   Laterality: N/A;   FOREIGN BODY REMOVAL  03/30/2020   Procedure: FOREIGN BODY REMOVAL;  Surgeon: Abran Norleen SAILOR, MD;  Location: [facility] ENDOSCOPY;  Service: Endoscopy;;   GIVENS CAPSULE STUDY N/A 03/28/2020   Procedure: GIVENS CAPSULE STUDY;  Surgeon: Abran Norleen SAILOR, MD;  Location: [facility] ENDOSCOPY;  Service: Endoscopy;  Laterality: N/A;   HEMOSTASIS CONTROL  02/26/2020   Procedure: HEMOSTASIS CONTROL;  Surgeon: Abran Norleen SAILOR, MD;  Location: [facility] ENDOSCOPY;  Service: Endoscopy;;   HEMOSTASIS CONTROL  03/30/2020   Procedure: HEMOSTASIS CONTROL;  Surgeon: Abran Norleen SAILOR, MD;  Location: [facility] ENDOSCOPY;  Service: Endoscopy;;   HOT HEMOSTASIS N/A 02/26/2020   Procedure: HOT HEMOSTASIS (ARGON PLASMA COAGULATION/BICAP);  Surgeon: Abran Norleen SAILOR, MD;  Location: [facility] ENDOSCOPY;  Service: Endoscopy;  Laterality: N/A;   MOHS SURGERY  2012   melanoma, mid back, [place]    RIGHT/LEFT HEART CATH AND CORONARY ANGIOGRAPHY N/A 08/13/2022   Procedure: RIGHT/LEFT HEART CATH AND CORONARY ANGIOGRAPHY;  Surgeon: Verlin Lonni BIRCH, MD;  Location: [facility] INVASIVE CV LAB;  Service: Cardiovascular;  Laterality: N/A;   ROUX-EN-Y PROCEDURE  2000   TOTAL SHOULDER REPLACEMENT Right 2011   [place]    WISDOM TOOTH EXTRACTION       reports that he quit smoking about 61 years ago. His smoking use included cigarettes. He has never used smokeless tobacco. He reports that he does not currently use alcohol. He reports that he does not use drugs.  Allergies[1]  Family History  Problem Relation Age of Onset   Hypertension Father    Macular degeneration Mother    Melanoma Mother    Melanoma Sister    Stomach cancer Sister 76       duodenal   Breast cancer Sister    Uterine cancer Sister    Melanoma Brother    Colon polyps Neg Hx    Colon cancer Neg Hx    Esophageal cancer Neg Hx    Rectal cancer Neg Hx     Prior to Admission medications  Medication Sig Start Date End Date Taking? Authorizing Provider  acetaminophen  (TYLENOL ) 325 MG  tablet Take 2 tablets (650 mg total) by mouth every 6 (six) hours as needed for moderate pain. 04/01/20   Arrien, Elidia Sieving, MD  calcium  carbonate (OS-CAL - DOSED IN MG OF ELEMENTAL CALCIUM ) 1250 (500 Ca) MG tablet Take 1 tablet by mouth daily with supper.    [provider]  Cholecalciferol (VITAMIN D ) 2000 UNITS tablet Take 1 tablet (2,000 Units total) by mouth daily. 10/26/13   Inocencio Berwyn LABOR, MD  clonazePAM  (KLONOPIN ) 1 MG tablet Take 1 tablet (1 mg total) by mouth 2 (two) times daily as needed for anxiety. 02/19/24   Geofm Glade PARAS, MD  clopidogrel  (PLAVIX ) 75 MG tablet TAKE 1 TABLET(75 MG) BY MOUTH DAILY WITH BREAKFAST 12/10/23   Carlin Delon BROCKS, NP  DULoxetine  (CYMBALTA ) 60 MG capsule Take 1 capsule (60 mg total) by mouth daily. 09/20/23   Geofm Glade PARAS, MD  empagliflozin  (JARDIANCE ) 10 MG TABS tablet Take 1 tablet (10 mg total) by mouth daily before breakfast. 11/30/23   Carlin Delon BROCKS, NP  finasteride  (PROSCAR ) 5 MG tablet TAKE 1  TABLET(5 MG) BY MOUTH DAILY 11/29/23   Geofm Glade PARAS, MD  furosemide  (LASIX ) 20 MG tablet Take 1 tablet (20 mg total) by mouth daily. Take daily for 3 days then as needed for weight gain of 3 lbs in a day 11/30/23   Carlin Delon BROCKS, NP  levothyroxine  (SYNTHROID ) 88 MCG tablet TAKE 1 TABLET BY MOUTH EVERY DAY BEFORE BREAKFAST 01/03/24   Geofm Glade PARAS, MD  Multiple Vitamin (MULTIVITAMIN WITH MINERALS) TABS tablet Take 1 tablet by mouth in the morning.    [provider]  nitroGLYCERIN  (NITROSTAT ) 0.4 MG SL tablet Place 1 tablet (0.4 mg total) under the tongue every 5 (five) minutes as needed. 11/30/23   Carlin Delon BROCKS, NP  pantoprazole  (PROTONIX ) 40 MG tablet Take 1 tablet (40 mg total) by mouth daily. 12/20/23   Verlin Lonni BIRCH, MD  predniSONE  (DELTASONE ) 5 MG tablet TAKE 1 TABLET BY MOUTH EVERY DAY WITH BREAKFAST 02/23/24   Geofm Glade PARAS, MD  simvastatin  (ZOCOR ) 20 MG tablet Take 1 tablet (20 mg total) by mouth at bedtime.  11/30/23   Carlin Delon BROCKS, NP  tamsulosin  (FLOMAX ) 0.4 MG CAPS capsule TAKE 1 CAPSULE BY MOUTH EVERY DAY AFTER SUPPER 09/20/23   Geofm Glade PARAS, MD    Physical Exam: Constitutional: Moderately built and nourished. Vitals:   03/09/24 1029 03/09/24 1115 03/09/24 1922  BP: (!) 148/93  (!) 127/91  Pulse: 95  81  Resp: 18  18  Temp: (!) 97.4 F (36.3 C)  98.1 F (36.7 C)  TempSrc: Oral  Oral  SpO2: 100%  100%  Weight:  89.8 kg   Height:  5' 11 (1.803 m)    Eyes: Anicteric no pallor. ENMT: No discharge from the ears eyes nose or mouth. Neck: No mass felt.  No neck rigidity. Respiratory: No rhonchi or crepitations. Cardiovascular: S1-S2 heard. Abdomen: Soft nontender bowel sound present. Musculoskeletal: No edema. Skin: No rash. Neurologic: Alert awake oriented to time place and person.  Moves all extremities 5 x 5.  No facial asymmetry tongue is midline pupils are equal and reactive to light. Psychiatric: Appears normal.  Normal affect.   Labs on Admission: I have personally reviewed following labs and imaging studies  CBC: Recent Labs  Lab 03/09/24 1146 03/09/24 2000  WBC 7.8  --   NEUTROABS 6.1  --   HGB 13.1 12.2*  HCT 43.5 36.0*  MCV 84.0  --   PLT 205  --    Basic Metabolic Panel: Recent Labs  Lab 03/09/24 1146 03/09/24 2000  NA 138 139  K 4.4 4.4  CL 103  --   CO2 19*  --   GLUCOSE 103*  --   BUN 18  --   CREATININE 1.51*  --   CALCIUM  9.7  --    GFR: Estimated Creatinine Clearance: 42.9 mL/min (A) (by C-G formula based on SCr of 1.51 mg/dL (H)). Liver Function Tests: Recent Labs  Lab 03/09/24 1146  AST 30  ALT 20  ALKPHOS 71  BILITOT 1.2  PROT 6.7  ALBUMIN 4.0   No results for input(s): LIPASE, AMYLASE in the last 168 hours. No results for input(s): AMMONIA in the last 168 hours. Coagulation Profile: No results for input(s): INR, PROTIME in the last 168 hours. Cardiac Enzymes: No results for input(s): CKTOTAL, CKMB,  CKMBINDEX, TROPONINI in the last 168 hours. BNP (last 3 results) Recent Labs    11/29/23 0840 03/09/24 1146  PROBNP 1,303* 2,199.0*   HbA1C: No results  for input(s): HGBA1C in the last 72 hours. CBG: No results for input(s): GLUCAP in the last 168 hours. Lipid Profile: No results for input(s): CHOL, HDL, LDLCALC, TRIG, CHOLHDL, LDLDIRECT in the last 72 hours. Thyroid  Function Tests: No results for input(s): TSH, T4TOTAL, FREET4, T3FREE, THYROIDAB in the last 72 hours. Anemia Panel: No results for input(s): VITAMINB12, FOLATE, FERRITIN, TIBC, IRON , RETICCTPCT in the last 72 hours. Urine analysis:    Component Value Date/Time   COLORURINE YELLOW 03/09/2024 1640   APPEARANCEUR CLEAR 03/09/2024 1640   LABSPEC 1.031 (H) 03/09/2024 1640   PHURINE 5.0 03/09/2024 1640   GLUCOSEU >=500 (A) 03/09/2024 1640   HGBUR NEGATIVE 03/09/2024 1640   BILIRUBINUR NEGATIVE 03/09/2024 1640   KETONESUR 20 (A) 03/09/2024 1640   PROTEINUR NEGATIVE 03/09/2024 1640   NITRITE NEGATIVE 03/09/2024 1640   LEUKOCYTESUR NEGATIVE 03/09/2024 1640   Sepsis Labs: @LABRCNTIP (procalcitonin:4,lacticidven:4) ) Recent Results (from the past 240 hours)  Resp panel by RT-PCR (RSV, Flu A&B, Covid) Anterior Nasal Swab     Status: None   Collection Time: 03/09/24 11:46 AM   Specimen: Anterior Nasal Swab  Result Value Ref Range Status   SARS Coronavirus 2 by RT PCR NEGATIVE NEGATIVE Final   Influenza A by PCR NEGATIVE NEGATIVE Final   Influenza B by PCR NEGATIVE NEGATIVE Final    Comment: (NOTE) The Xpert Xpress SARS-CoV-2/FLU/RSV plus assay is intended as an aid in the diagnosis of influenza from Nasopharyngeal swab specimens and should not be used as a sole basis for treatment. Nasal washings and aspirates are unacceptable for Xpert Xpress SARS-CoV-2/FLU/RSV testing.  Fact Sheet for Patients: bloggercourse.com  Fact Sheet for Healthcare  Providers: seriousbroker.it  This test is not yet approved or cleared by the [place]  FDA and has been authorized for detection and/or diagnosis of SARS-CoV-2 by FDA under an Emergency Use Authorization (EUA). This EUA will remain in effect (meaning this test can be used) for the duration of the COVID-19 declaration under Section 564(b)(1) of the Act, 21 U.S.C. section 360bbb-3(b)(1), unless the authorization is terminated or revoked.     Resp Syncytial Virus by PCR NEGATIVE NEGATIVE Final    Comment: (NOTE) Fact Sheet for Patients: bloggercourse.com  Fact Sheet for Healthcare Providers: seriousbroker.it  This test is not yet approved or cleared by the [place]  FDA and has been authorized for detection and/or diagnosis of SARS-CoV-2 by FDA under an Emergency Use Authorization (EUA). This EUA will remain in effect (meaning this test can be used) for the duration of the COVID-19 declaration under Section 564(b)(1) of the Act, 21 U.S.C. section 360bbb-3(b)(1), unless the authorization is terminated or revoked.  Performed at [facility], [address] [place], [place] [address]      Radiological Exams on Admission: CT Angio Chest PE W and/or Wo Contrast Result Date: 03/09/2024 CLINICAL DATA:  Short of breath, positive D-dimer, history of melanoma EXAM: CT ANGIOGRAPHY CHEST WITH CONTRAST TECHNIQUE: Multidetector CT imaging of the chest was performed using the standard protocol during bolus administration of intravenous contrast. Multiplanar CT image reconstructions and MIPs were obtained to evaluate the vascular anatomy. RADIATION DOSE REDUCTION: This exam was performed according to the departmental dose-optimization program which includes automated exposure control, adjustment of the mA and/or kV according to patient size and/or use of iterative reconstruction technique. CONTRAST:  75mL  OMNIPAQUE  IOHEXOL  350 MG/ML SOLN COMPARISON:  05/20/2022, 03/09/2024 FINDINGS: Cardiovascular: This is a technically adequate evaluation of the pulmonary vasculature. No filling defects or pulmonary emboli.  Heart is unremarkable without pericardial effusion. Mild calcifications of the aortic and mitral valves. No evidence of thoracic aortic aneurysm or dissection. Atherosclerosis of the aorta and coronary vasculature. Mediastinum/Nodes: No enlarged mediastinal, hilar, or axillary lymph nodes. Thyroid  gland, trachea, and esophagus demonstrate no significant findings. Small hiatal hernia. Lungs/Pleura: Stable basilar predominant subpleural scarring. No acute airspace disease, effusion, or pneumothorax. Central airways are patent. There is a stable 3 mm lingular nodule reference image 83/13, which can be considered benign given long-term stability and size. There are no other pulmonary nodules to correspond to the reported nodularity on chest x-ray, which likely reflected summation of shadows. Upper Abdomen: Previous bariatric surgery. Small hiatal hernia. No acute upper abdominal findings. Musculoskeletal: Since the 2014 exam, a mid sternal fracture has developed, with approximately 5 mm of ventral displacement of the distal sternal fracture fragment. A fracture line is still visible, sclerotic margins are seen along the fracture line, which is still visible. Right shoulder arthroplasty. Stable lymphadenectomy clips within the left axilla. Reconstructed images demonstrate no additional findings. Review of the MIP images confirms the above findings. IMPRESSION: 1. No evidence of pulmonary embolus. 2. No acute airspace disease. 3. Interval development of a mid sternal fracture as above, with sclerosis along the persistent fracture line. I would favor a healing subacute fracture, though a chronic nonunion cap a similar appearance. No signs of intrathoracic metastatic disease to suggest a pathologic fracture. Please  correlate with physical exam findings and any history of prior trauma. 4. Small hiatal hernia, with postsurgical changes from prior bariatric surgery. 5. Aortic Atherosclerosis (ICD10-I70.0). Coronary artery atherosclerosis. Electronically Signed   By: Ozell Daring M.D.   On: 03/09/2024 17:49   DG Chest Port 1 View Result Date: 03/09/2024 CLINICAL DATA:  Shortness of breath with dizziness and fatigue. EXAM: PORTABLE CHEST 1 VIEW COMPARISON:  January 19, 2019 FINDINGS: The heart size and mediastinal contours are within normal limits. A 2 mm nodular opacity is seen overlying the periphery of the upper left lung. No acute infiltrate, pleural effusion or pneumothorax is identified. Radiopaque surgical clips are seen along the left axilla. There is evidence of prior right shoulder arthroplasty. Chronic right rib deformities are noted. Degenerative changes are noted throughout the thoracic spine. IMPRESSION: 1. No acute cardiopulmonary disease. 2. Findings may represent a 2 mm left upper lobe pulmonary nodule. Correlation with nonemergent chest CT is recommended. Electronically Signed   By: Suzen Dials M.D.   On: 03/09/2024 12:39    EKG: Independently reviewed.  Normal sinus rhythm.  Assessment/Plan Principal Problem:   Exertional dyspnea    Exertional dyspnea with fatigue -     cause is not clear.  CT angiogram was negative for PE.  Patient does not overtly look to be in fluid overload.  Will closely monitor in telemetry.  Will consult cardiology.  Continue patient's prednisone  and Synthroid .  Follow free T4 cortisol levels.  Check CK levels.  If patient's symptoms are not related to cardiac issues may discuss with patient's endocrinologist in the morning. Central adrenal insufficiency and hypothyroidism secondary to treatment to melanoma on prednisone  and Synthroid .  See #1. History of CAD status post DES in 2024 troponins mildly elevated but flat.  Denies any chest pain.  Will continue with  Plavix  statins. Chronic kidney disease stage III with metabolic acidosis creatinine mildly increased from baseline was given fluid bolus in the ER.  Will continue to monitor metabolic panel. Chronic HFrEF with improved EF last EF measured in December 2025  was 55 to 60%.  Takes Jardiance .  Appears compensated. GERD on PPI. BPH on Proscar  and finasteride . Metastatic melanoma being followed at [facility] presently under surveillance. Midsternal fracture with CT scan does not show any intrathoracic metastasis.  Will need follow-up.  Patient denies any chest pain.  DVT prophylaxis: Lovenox . Code Status: Full code. Family Communication: Discussed with patient's wife. Disposition Plan: Monitored bed.   Consults called: Cardiology. Admission status: Observation.         [1]  Allergies Allergen Reactions   Rosuvastatin  Other (See Comments)    Leg pains   "

## 2024-03-09 NOTE — Evaluation (Addendum)
 RT Evaluate and Treat Note  03/09/2024   Breathing is (select one): Worse than normal   The following was found on auscultation (select multiple):  Bilateral Breath Sounds: Clear (03/09/24 2240)             Cough Assessment:      Most Recent Chest Xray:... (DG Chest Port 1 View Result Date: 03/09/2024 CLINICAL DATA:  Shortness of breath with dizziness and fatigue. EXAM: PORTABLE CHEST 1 VIEW COMPARISON:  January 19, 2019 FINDINGS: The heart size and mediastinal contours are within normal limits. A 2 mm nodular opacity is seen overlying the periphery of the upper left lung. No acute infiltrate, pleural effusion or pneumothorax is identified. Radiopaque surgical clips are seen along the left axilla. There is evidence of prior right shoulder arthroplasty. Chronic right rib deformities are noted. Degenerative changes are noted throughout the thoracic spine. IMPRESSION: 1. No acute cardiopulmonary disease. 2. Findings may represent a 2 mm left upper lobe pulmonary nodule. Correlation with nonemergent chest CT is recommended. Electronically Signed   By: Suzen Dials M.D.   On: 03/09/2024 12:39      The following medications and/or interventions were ordered/changed/discontinued as part of the Respiratory Treatment protocol:   Medication Changes: Albuterol  Ordered   Airway Clearance Changes: No Change   Oxygen Therapy Changes:No Change  Cardiology consulted as symptoms don't seem to be respiratory driven. No home regimen.

## 2024-03-10 DIAGNOSIS — I2510 Atherosclerotic heart disease of native coronary artery without angina pectoris: Secondary | ICD-10-CM

## 2024-03-10 DIAGNOSIS — N183 Chronic kidney disease, stage 3 unspecified: Secondary | ICD-10-CM | POA: Insufficient documentation

## 2024-03-10 LAB — COMPREHENSIVE METABOLIC PANEL WITH GFR
ALT: 14 U/L (ref 0–44)
AST: 27 U/L (ref 15–41)
Albumin: 3 g/dL — ABNORMAL LOW (ref 3.5–5.0)
Alkaline Phosphatase: 53 U/L (ref 38–126)
Anion gap: 9 (ref 5–15)
BUN: 15 mg/dL (ref 8–23)
CO2: 22 mmol/L (ref 22–32)
Calcium: 8.4 mg/dL — ABNORMAL LOW (ref 8.9–10.3)
Chloride: 109 mmol/L (ref 98–111)
Creatinine, Ser: 1.3 mg/dL — ABNORMAL HIGH (ref 0.61–1.24)
GFR, Estimated: 56 mL/min — ABNORMAL LOW
Glucose, Bld: 75 mg/dL (ref 70–99)
Potassium: 4.2 mmol/L (ref 3.5–5.1)
Sodium: 141 mmol/L (ref 135–145)
Total Bilirubin: 0.7 mg/dL (ref 0.0–1.2)
Total Protein: 5 g/dL — ABNORMAL LOW (ref 6.5–8.1)

## 2024-03-10 LAB — CBC
HCT: 35.4 % — ABNORMAL LOW (ref 39.0–52.0)
Hemoglobin: 10.9 g/dL — ABNORMAL LOW (ref 13.0–17.0)
MCH: 25.8 pg — ABNORMAL LOW (ref 26.0–34.0)
MCHC: 30.8 g/dL (ref 30.0–36.0)
MCV: 83.7 fL (ref 80.0–100.0)
Platelets: 186 10*3/uL (ref 150–400)
RBC: 4.23 MIL/uL (ref 4.22–5.81)
RDW: 18.1 % — ABNORMAL HIGH (ref 11.5–15.5)
WBC: 6.2 10*3/uL (ref 4.0–10.5)
nRBC: 0 % (ref 0.0–0.2)

## 2024-03-10 LAB — CORTISOL: Cortisol, Plasma: 0.4 ug/dL

## 2024-03-10 MED ORDER — HYDROCORTISONE SOD SUC (PF) 100 MG IJ SOLR
100.0000 mg | Freq: Three times a day (TID) | INTRAMUSCULAR | Status: AC
  Administered 2024-03-10: 100 mg via INTRAVENOUS
  Filled 2024-03-10 (×2): qty 2

## 2024-03-10 MED ORDER — PREDNISONE 5 MG PO TABS: 5.0000 mg | ORAL_TABLET | Freq: Every day | ORAL | Status: AC

## 2024-03-10 NOTE — Care Management Obs Status (Signed)
 MEDICARE OBSERVATION STATUS NOTIFICATION   Patient Details  Name: Maxwell Fox MRN: [id number] Date of Birth: 06-04-1945   Medicare Observation Status Notification Given:  Yes    Nena LITTIE Coffee, RN 03/10/2024, 4:56 PM

## 2024-03-10 NOTE — Progress Notes (Signed)
 " PROGRESS NOTE    Maxwell Fox  FMW:[id number] DOB: January 22, 1946 DOA: 03/09/2024 PCP: Geofm Glade PARAS, MD  Outpatient Specialists:     Brief Narrative:  As per H&P done on presentation: Maxwell Fox is a 79 y.o. male with history of metastatic melanoma with history of craniotomy being followed at oncology at [facility] presently under surveillance, CAD status post PTCA/DES to proximal LAD in 2024, chronic HFrEF last EF measured was 55 to 60% on December 2025, central adrenal insufficiency and hypothyroidism related to treatment of melanoma has been experiencing exertional dyspnea with fatigue over the last 1 month.  Patient states that last few days he has become extremely short of breath with minimal exertion that he is not hardly able to do any of his daily activities now.  Denies any chest pain fever chills nausea vomiting or diarrhea.  Had followed up with cardiology today and due to his symptoms was referred to the ER.  Patient states he has been compliant with his prednisone  Synthroid .  Has not been on any diuretics since October 2025 due to patient not noticing any edema.   ED Course: In ER patient appears nonfocal.  CT angiogram of the chest is negative for PE but did show some mid sternal fracture.  proBNP is 2100 and troponins were 21 and 24.  Creatinine 1.5 hemoglobin 13.1 bicarb 19.  EKG shows normal sinus rhythm.  UA is negative for LE and nitrites.  Patient was given 500 cc normal saline bolus.  ER physician discussed with cardiologist.  Patient admitted for further observation.  03/10/2024: Patient seen.  Cardiology input is appreciated.  Patient is compensated from cardiac point.  Patient has history of hypopituitarism.  Patient has been on prednisone  5 mg p.o. once daily for a long time.  Cortisol is very decreased.  Patient continues to report fatigue and dyspnea on minimal exertion.  Will try IV hydrocortisone .  TSH and free T4 noted.   Assessment & Plan:   Principal  Problem:   Exertional dyspnea Active Problems:   BPH (benign prostatic hyperplasia)   Melanoma metastatic to brain [facility])   Adrenal insufficiency due to cancer therapy   Hypothyroid   History of Roux-en-Y gastric bypass   Chronic systolic CHF (congestive heart failure) (HCC)   Chronic kidney disease, stage III (moderate) (HCC)   CAD S/P percutaneous coronary angioplasty   Exertional dyspnea with fatigue: - Etiology unclear. - Patient has history of hypopituitarism (see above documentation). - Patient has been on prednisone  5 mg p.o. once daily for a long time. - Low cortisol. - Cardiology input is appreciated.  Patient is compensated cardiac wise. - Will try IV hydrocortisone  100 mg Q8 hourly x 6 doses. - PT/OT.    Central adrenal insufficiency and hypothyroidism: - Secondary to treatment to melanoma. - On prednisone  and Synthroid .    History of CAD: - Status post DES in 2024 troponins mildly elevated but flat.  Denies any chest pain.  Will continue with Plavix  statins.  Chronic kidney disease stage IIIa: - Stable.    Chronic HFrEF with improved EF last EF measured in December 2025 was 55 to 60%.  Takes Jardiance .  Appears compensated.  GERD on PPI. BPH on Proscar  and finasteride . Metastatic melanoma being followed at [facility] presently under surveillance. Midsternal fracture with CT scan does not show any intrathoracic metastasis.  Will need follow-up.  Patient denies any chest pain.   DVT prophylaxis: Subcutaneous Love blood Code Status: Full code Family Communication:  Disposition  Plan:    Consultants:  Cardiology  Procedures:  None  Antimicrobials:  None   Subjective: No new complaints.  Objective: Vitals:   03/10/24 0207 03/10/24 0820 03/10/24 0900 03/10/24 1152  BP: (!) 147/77 139/69 (!) 125/43 128/72  Pulse: 79 74 72 72  Resp: 18 17 14 18   Temp: 99.3 F (37.4 C) 98.4 F (36.9 C)  98 F (36.7 C)  TempSrc: Oral Oral  Oral  SpO2: 100%  100% 97% 100%  Weight:      Height:        Intake/Output Summary (Last 24 hours) at 03/10/2024 1618 Last data filed at 03/10/2024 0801 Gross per 24 hour  Intake 1000 ml  Output --  Net 1000 ml   Filed Weights   03/09/24 1115  Weight: 89.8 kg    Examination:  General exam: Appears calm and comfortable  Respiratory system: Clear to auscultation. Respiratory effort normal. Cardiovascular system: S1 & S2 heard Gastrointestinal system: Abdomen is soft and nontender.  Central nervous system: Alert and oriented. No focal neurological deficits. Extremities: No leg edema  Data Reviewed: I have personally reviewed following labs and imaging studies  CBC: Recent Labs  Lab 03/09/24 1146 03/09/24 2000 03/10/24 0403  WBC 7.8  --  6.2  NEUTROABS 6.1  --   --   HGB 13.1 12.2* 10.9*  HCT 43.5 36.0* 35.4*  MCV 84.0  --  83.7  PLT 205  --  186   Basic Metabolic Panel: Recent Labs  Lab 03/09/24 1146 03/09/24 1939 03/09/24 2000 03/10/24 0403  NA 138  --  139 141  K 4.4  --  4.4 4.2  CL 103  --   --  109  CO2 19*  --   --  22  GLUCOSE 103*  --   --  75  BUN 18  --   --  15  CREATININE 1.51* 1.34*  --  1.30*  CALCIUM  9.7  --   --  8.4*   GFR: Estimated Creatinine Clearance: 49.9 mL/min (A) (by C-G formula based on SCr of 1.3 mg/dL (H)). Liver Function Tests: Recent Labs  Lab 03/09/24 1146 03/10/24 0403  AST 30 27  ALT 20 14  ALKPHOS 71 53  BILITOT 1.2 0.7  PROT 6.7 5.0*  ALBUMIN 4.0 3.0*   No results for input(s): LIPASE, AMYLASE in the last 168 hours. No results for input(s): AMMONIA in the last 168 hours. Coagulation Profile: No results for input(s): INR, PROTIME in the last 168 hours. Cardiac Enzymes: No results for input(s): CKTOTAL, CKMB, CKMBINDEX, TROPONINI in the last 168 hours. BNP (last 3 results) Recent Labs    11/29/23 0840 03/09/24 1146  PROBNP 1,303* 2,199.0*   HbA1C: No results for input(s): HGBA1C in the last 72  hours. CBG: No results for input(s): GLUCAP in the last 168 hours. Lipid Profile: No results for input(s): CHOL, HDL, LDLCALC, TRIG, CHOLHDL, LDLDIRECT in the last 72 hours. Thyroid  Function Tests: Recent Labs    03/09/24 1939 03/09/24 2000  TSH  --  0.233*  FREET4 1.96  --    Anemia Panel: No results for input(s): VITAMINB12, FOLATE, FERRITIN, TIBC, IRON , RETICCTPCT in the last 72 hours. Urine analysis:    Component Value Date/Time   COLORURINE YELLOW 03/09/2024 1640   APPEARANCEUR CLEAR 03/09/2024 1640   LABSPEC 1.031 (H) 03/09/2024 1640   PHURINE 5.0 03/09/2024 1640   GLUCOSEU >=500 (A) 03/09/2024 1640   HGBUR NEGATIVE 03/09/2024 1640   BILIRUBINUR NEGATIVE 03/09/2024 1640  KETONESUR 20 (A) 03/09/2024 1640   PROTEINUR NEGATIVE 03/09/2024 1640   NITRITE NEGATIVE 03/09/2024 1640   LEUKOCYTESUR NEGATIVE 03/09/2024 1640   Sepsis Labs: @LABRCNTIP (procalcitonin:4,lacticidven:4)  ) Recent Results (from the past 240 hours)  Resp panel by RT-PCR (RSV, Flu A&B, Covid) Anterior Nasal Swab     Status: None   Collection Time: 03/09/24 11:46 AM   Specimen: Anterior Nasal Swab  Result Value Ref Range Status   SARS Coronavirus 2 by RT PCR NEGATIVE NEGATIVE Final   Influenza A by PCR NEGATIVE NEGATIVE Final   Influenza B by PCR NEGATIVE NEGATIVE Final    Comment: (NOTE) The Xpert Xpress SARS-CoV-2/FLU/RSV plus assay is intended as an aid in the diagnosis of influenza from Nasopharyngeal swab specimens and should not be used as a sole basis for treatment. Nasal washings and aspirates are unacceptable for Xpert Xpress SARS-CoV-2/FLU/RSV testing.  Fact Sheet for Patients: bloggercourse.com  Fact Sheet for Healthcare Providers: seriousbroker.it  This test is not yet approved or cleared by the [place]  FDA and has been authorized for detection and/or diagnosis of SARS-CoV-2 by FDA under an Emergency  Use Authorization (EUA). This EUA will remain in effect (meaning this test can be used) for the duration of the COVID-19 declaration under Section 564(b)(1) of the Act, 21 U.S.C. section 360bbb-3(b)(1), unless the authorization is terminated or revoked.     Resp Syncytial Virus by PCR NEGATIVE NEGATIVE Final    Comment: (NOTE) Fact Sheet for Patients: bloggercourse.com  Fact Sheet for Healthcare Providers: seriousbroker.it  This test is not yet approved or cleared by the [place]  FDA and has been authorized for detection and/or diagnosis of SARS-CoV-2 by FDA under an Emergency Use Authorization (EUA). This EUA will remain in effect (meaning this test can be used) for the duration of the COVID-19 declaration under Section 564(b)(1) of the Act, 21 U.S.C. section 360bbb-3(b)(1), unless the authorization is terminated or revoked.  Performed at [facility], [address] [place], [place] [address]          Radiology Studies: CT Angio Chest PE W and/or Wo Contrast Result Date: 03/09/2024 CLINICAL DATA:  Short of breath, positive D-dimer, history of melanoma EXAM: CT ANGIOGRAPHY CHEST WITH CONTRAST TECHNIQUE: Multidetector CT imaging of the chest was performed using the standard protocol during bolus administration of intravenous contrast. Multiplanar CT image reconstructions and MIPs were obtained to evaluate the vascular anatomy. RADIATION DOSE REDUCTION: This exam was performed according to the departmental dose-optimization program which includes automated exposure control, adjustment of the mA and/or kV according to patient size and/or use of iterative reconstruction technique. CONTRAST:  75mL OMNIPAQUE  IOHEXOL  350 MG/ML SOLN COMPARISON:  05/20/2022, 03/09/2024 FINDINGS: Cardiovascular: This is a technically adequate evaluation of the pulmonary vasculature. No filling defects or pulmonary emboli. Heart is unremarkable  without pericardial effusion. Mild calcifications of the aortic and mitral valves. No evidence of thoracic aortic aneurysm or dissection. Atherosclerosis of the aorta and coronary vasculature. Mediastinum/Nodes: No enlarged mediastinal, hilar, or axillary lymph nodes. Thyroid  gland, trachea, and esophagus demonstrate no significant findings. Small hiatal hernia. Lungs/Pleura: Stable basilar predominant subpleural scarring. No acute airspace disease, effusion, or pneumothorax. Central airways are patent. There is a stable 3 mm lingular nodule reference image 83/13, which can be considered benign given long-term stability and size. There are no other pulmonary nodules to correspond to the reported nodularity on chest x-ray, which likely reflected summation of shadows. Upper Abdomen: Previous bariatric surgery. Small hiatal hernia. No acute upper abdominal  findings. Musculoskeletal: Since the 2014 exam, a mid sternal fracture has developed, with approximately 5 mm of ventral displacement of the distal sternal fracture fragment. A fracture line is still visible, sclerotic margins are seen along the fracture line, which is still visible. Right shoulder arthroplasty. Stable lymphadenectomy clips within the left axilla. Reconstructed images demonstrate no additional findings. Review of the MIP images confirms the above findings. IMPRESSION: 1. No evidence of pulmonary embolus. 2. No acute airspace disease. 3. Interval development of a mid sternal fracture as above, with sclerosis along the persistent fracture line. I would favor a healing subacute fracture, though a chronic nonunion cap a similar appearance. No signs of intrathoracic metastatic disease to suggest a pathologic fracture. Please correlate with physical exam findings and any history of prior trauma. 4. Small hiatal hernia, with postsurgical changes from prior bariatric surgery. 5. Aortic Atherosclerosis (ICD10-I70.0). Coronary artery atherosclerosis.  Electronically Signed   By: Ozell Daring M.D.   On: 03/09/2024 17:49   DG Chest Port 1 View Result Date: 03/09/2024 CLINICAL DATA:  Shortness of breath with dizziness and fatigue. EXAM: PORTABLE CHEST 1 VIEW COMPARISON:  January 19, 2019 FINDINGS: The heart size and mediastinal contours are within normal limits. A 2 mm nodular opacity is seen overlying the periphery of the upper left lung. No acute infiltrate, pleural effusion or pneumothorax is identified. Radiopaque surgical clips are seen along the left axilla. There is evidence of prior right shoulder arthroplasty. Chronic right rib deformities are noted. Degenerative changes are noted throughout the thoracic spine. IMPRESSION: 1. No acute cardiopulmonary disease. 2. Findings may represent a 2 mm left upper lobe pulmonary nodule. Correlation with nonemergent chest CT is recommended. Electronically Signed   By: Suzen Dials M.D.   On: 03/09/2024 12:39        Scheduled Meds:  clopidogrel   75 mg Oral Daily   enoxaparin  (LOVENOX ) injection  40 mg Subcutaneous Q24H   finasteride   5 mg Oral Daily   hydrocortisone  sod succinate (SOLU-CORTEF ) inj  100 mg Intravenous Q8H   levothyroxine   88 mcg Oral Q0600   pantoprazole   40 mg Oral Daily   predniSONE   5 mg Oral Q breakfast   simvastatin   20 mg Oral QHS   tamsulosin   0.4 mg Oral QPC supper   Continuous Infusions:   LOS: 0 days    Time spent: 5 minutes.    Leatrice Chapel, MD  [facility] 7PM-7AM contact night coverage as above    "

## 2024-03-10 NOTE — ED Notes (Signed)
 Pt supposed to be on telemetry. Pt remains in hallway with no monitoring.

## 2024-03-10 NOTE — Progress Notes (Signed)
 Transition of Care [facility]) - Inpatient Brief Assessment  Patient Details  Name: Maxwell Fox MRN: [id number] Date of Birth: 1945/04/04  Transition of Care [facility]) CM/SW Contact:    Nena LITTIE Coffee, RN Phone Number: 03/10/2024, 6:00 PM  Clinical Narrative: PT has recommended HHPT. Pt would like to think about it. He has used [facility] in the past and would like to stay c/[facility] if he decides on HHPT. Instructed pt that if he decides on HHPT after dc that his PCP can order for him.   Transition of Care Department [facility]) has reviewed patient and will continue to monitor patient advancement.  If new patient needs arise, please place a TOC consult.  Transition of Care Asessment: Insurance and Status: (P) Insurance coverage has been reviewed Patient has primary care physician: (P) Yes Home environment has been reviewed: (P) From home c/wife Prior level of function:: (P) Independent Prior/Current Home Services: (P) No current home services Social Drivers of Health Review: (P) SDOH reviewed no interventions necessary Readmission risk has been reviewed: (P) Yes Transition of care needs: (P) transition of care needs identified, TOC will continue to follow

## 2024-03-10 NOTE — ED Notes (Signed)
 Secure chat sent to provider requesting confirmation for administering Plavix  and lovenox 

## 2024-03-10 NOTE — Evaluation (Signed)
 Physical Therapy Evaluation Patient Details Name: Maxwell Fox MRN: [id number] DOB: February 10, 1945 Today's Date: 03/10/2024  History of Present Illness  Pt is 79 yo presenting [facility] on shortness of breathe.  PMH includes LAD 2024, HF with improved EF, mild MR, Moderate MAC, HTN, melanoma with brain mets, hypothyroidism, depression, BPH, GIB, GERD.  Clinical Impression  Pt currently presenting at Mod I for bed mobility, supervision for sit to stand without a device and CGA for gait with RW. Pt demonstrates poor dynamic functional mobility balance with a high risk for falls. Pt is slightly impulsive requiring verbal cues for safety throughout. Due to pt current functional status, home set up and available assistance at home recommending skilled physical therapy services 3x/week in order to address strength, balance and functional mobility to decrease risk for falls, injury and re-hospitalization.           If plan is discharge home, recommend the following: Help with stairs or ramp for entrance;Assistance with cooking/housework;Assist for transportation;A little help with walking and/or transfers     Equipment Recommendations None recommended by PT     Functional Status Assessment Patient has had a recent decline in their functional status and demonstrates the ability to make significant improvements in function in a reasonable and predictable amount of time.     Precautions / Restrictions Precautions Precautions: Fall Recall of Precautions/Restrictions: Impaired Restrictions Weight Bearing Restrictions Per Provider Order: No      Mobility  Bed Mobility Overal bed mobility: Independent    Transfers Overall transfer level: Needs assistance Equipment used: None Transfers: Sit to/from Stand Sit to Stand: Supervision           General transfer comment: supervision for safety, pt stood very quickly, wide BOS, uses bil UE to stabilize    Ambulation/Gait Ambulation/Gait assistance:  Contact guard assist, Min assist Gait Distance (Feet): 200 Feet Assistive device: Rolling walker (2 wheels) Gait Pattern/deviations: Step-through pattern, Decreased stride length, Wide base of support Gait velocity: decreased Gait velocity interpretation: <1.8 ft/sec, indicate of risk for recurrent falls   General Gait Details: 1x standing rest break, step through gait pattern, slightly increased BOS, HR 102 O2 sats 92% on room air with ambulation. Pt reports fatigue after ~ 120 ft of gait. with standing rest break. Pt slightly impulsive suddenly stepping backward and picking up RW when opening room door requiring Min A for balance due to posterior LOB    Balance Overall balance assessment: Needs assistance Sitting-balance support: Feet supported, No upper extremity supported Sitting balance-Leahy Scale: Good     Standing balance support: No upper extremity supported, During functional activity Standing balance-Leahy Scale: Poor Standing balance comment: pt was able to ambulate 3 ft at supervision with wide BOS. CGA for safety improved with UE Support.         Pertinent Vitals/Pain Pain Assessment Pain Assessment: No/denies pain    Home Living Family/patient expects to be discharged to:: Private residence Living Arrangements: Spouse/significant other Available Help at Discharge: Family;Available 24 hours/day Type of Home: House Home Access: Stairs to enter Entrance Stairs-Rails: Can reach both Entrance Stairs-Number of Steps: 2   Home Layout: One level Home Equipment: Pharmacist, Hospital (2 wheels);Grab bars - tub/shower;Grab bars - toilet;Wheelchair - manual Additional Comments: unsure of pt historian nursing states pt daughter reports pt has had multiple falls; pt reporting one fall.    Prior Function Prior Level of Function : Independent/Modified Independent;Driving;History of Falls (last six months)  Mobility Comments: reports 1 fall in 6 months.  Reports he is normally ambulatory without an AD ADLs Comments: Pt reports he is Ind     Extremity/Trunk Assessment   Upper Extremity Assessment Upper Extremity Assessment: Generalized weakness    Lower Extremity Assessment Lower Extremity Assessment: Generalized weakness    Cervical / Trunk Assessment Cervical / Trunk Assessment: Kyphotic  Communication   Communication Communication: No apparent difficulties    Cognition Arousal: Alert Behavior During Therapy: Impulsive   PT - Cognitive impairments: Safety/Judgement     PT - Cognition Comments: pt very impulsive, quickly standing, stepping backward quickly when moving hospital door requiring assist for balance. Following commands: Intact       Cueing Cueing Techniques: Verbal cues     General Comments General comments (skin integrity, edema, etc.): HR 102 bpm and O2 sats 93%        Assessment/Plan    PT Assessment Patient needs continued PT services  PT Problem List Cardiopulmonary status limiting activity;Decreased activity tolerance;Decreased balance;Decreased mobility       PT Treatment Interventions DME instruction;Therapeutic exercise;Gait training;Stair training;Functional mobility training;Therapeutic activities;Patient/family education;Balance training    PT Goals (Current goals can be found in the Care Plan section)  Acute Rehab PT Goals Patient Stated Goal: to go home and improve breathing and find out what is going on PT Goal Formulation: With patient Time For Goal Achievement: 03/24/24 Potential to Achieve Goals: Good    Frequency Min 2X/week        AM-PAC PT 6 Clicks Mobility  Outcome Measure Help needed turning from your back to your side while in a flat bed without using bedrails?: None Help needed moving from lying on your back to sitting on the side of a flat bed without using bedrails?: None Help needed moving to and from a bed to a chair (including a wheelchair)?: A Little Help  needed standing up from a chair using your arms (e.g., wheelchair or bedside chair)?: A Little Help needed to walk in hospital room?: A Little Help needed climbing 3-5 steps with a railing? : A Little 6 Click Score: 20    End of Session Equipment Utilized During Treatment: Gait belt Activity Tolerance: Patient tolerated treatment well Patient left: in bed;with call bell/phone within reach;with bed alarm set Nurse Communication: Mobility status PT Visit Diagnosis: Unsteadiness on feet (R26.81);Other abnormalities of gait and mobility (R26.89)    Time: 8373-8360 PT Time Calculation (min) (ACUTE ONLY): 13 min   Charges:   PT Evaluation $PT Eval Low Complexity: 1 Low   PT General Charges $$ ACUTE PT VISIT: 1 Visit        Dorothyann Maier, DPT, CLT  Acute Rehabilitation Services Office: [phone] (Secure chat preferred)   Dorothyann VEAR Maier 03/10/2024, 4:51 PM

## 2024-03-10 NOTE — Consult Note (Addendum)
 "  Cardiology Consultation  Patient ID: Maxwell Fox MRN: [id number]; DOB: 26-Sep-1945  Admit date: 03/09/2024 Date of Consult: 03/10/2024  PCP:  Geofm Glade PARAS, MD   [facility] Providers Cardiologist:  Lonni Cash, MD     Patient Profile: Maxwell Fox is a 79 y.o. male with a hx of CAD s/p DES to proximal LAD 2024, heart failure with improved EF (LVEF 30 to 35% 06/2022, 55 to 60% 01/2024), mild MR, moderate MAC, hypothyroidism, melanoma with metastasis to the brain, BPH, B12 deficiency, iron  deficiency anemia, GERD, CKD, adrenal insufficiency due to cancer therapy, osteoporosis, who is being seen 03/10/2024 for the evaluation of shortness of breath at the request of Dr. Franky.  History of Present Illness: [name] has past medical history as stated above.  He was seen by Katlyn West, NP in clinic 03/09/2024.  Due to his presentation with increased shortness of breath, DOE, fatigue with even mild activity that had been reported to be worsening over the last 2 days prior to arrival, and noted labored breathing at rest, she recommended he go to the ER for further evaluation and management.  Vital signs on arrival: BP 148/93, SpO2 100% on room air, HR 95, RR 18.  He was then admitted to the medicine service.   Relevant workup since being admitted includes: CBC consistent with chronic anemia, metabolic panel shows creatinine 1.30 (baseline ~1.10), TSH decreased, T4 normal, troponin mildly elevated and flat 21 ? 24, UA positive for ketones, glucose, high specific gravity.  proBNP elevated at 2199.  Respiratory panel negative.  CXR showed no acute disease, possible LUL pulmonary nodule 2 mm.  CTPA showed no PE, no acute airspace disease, mid sternal fracture noted to be likely healing, small hiatal hernia, stable 3 mm lingular nodule.  EKG showed sinus rhythm, LVH, HR 88.  Since being admitted to the medicine service, he has been restarted on most of his PTA medications, not  given any Lasix  thus far.   Recent cardiac workup: Echo 01/2024: LVEF 55 to 60%, no RWMA, normal RV systolic function, moderate LAE, mild MR, moderate MAC, normal IVC.  R/LHC 08/2022: Severe proximal LAD stenosis treated with PTCA/DES x 1, moderate nonobstructive disease in OM1, distal LCx, no disease and large dominant RCA.  Home cardiac meds: Plavix  75 mg daily, Jardiance  10 mg daily,PRN Lasix  20 mg x 3 days with symptoms, simvastatin  20 mg daily.  Of note, patient has a history of metastatic melanoma to the brain first diagnosed July 2011 s/p craniotomy with progressive disease noted in October 2022 to his lymph node/bones.  He follows closely with [facility], last seen 11/2023 reported to have no new lesions, chest discomfort, difficulty breathing.  He is continued on surveillance protocol with scheduled PET scan in 6 months.  He will be following up with Dr. Emil in regards to his brain metastasis, scheduled for next brain MRI in June 2026.  They also note his sternal fracture, patient had fell against a wall with impact to his chest at the end of August 2025.  After speaking with the patient, he agrees with history stated above.  He states that he has began feeling short of breath, more fatigued than normal for about 1 months getting progressively worse daily.  He denies any orthopnea, notes that he leg sleeping completely flat in the bed.  He tells me that when he is doing simple activity, such as unloading the dishwasher, he finds himself needing to stop frequently to be able  to catch his breath.  He has as needed Lasix , however he has not taken any recently due to the fact that it was instructed for weight gain and he has been intentionally losing weight lately.  He does note that he has been on prednisone  since undergoing his initial treatment for melanoma, this has been about 10 years.  He follows closely with a primary care provider as an outpatient who manages his adrenal insufficiency,  steroid use.  Past Medical History:  Diagnosis Date   B12 nutritional deficiency    malabsorbtion   Blood transfusion without reported diagnosis 2022   BPH (benign prostatic hypertrophy)    Brain cancer (HCC) 2017   removed golf ball sized mass   Depression    on meds   Diverticulosis    GERD (gastroesophageal reflux disease)    on meds   GI bleeding 02/2020   Hypertension    Iron  deficiency anemia    malabsorption related (s/p gastric bypass)   Melanoma (HCC)    Obesity    s/p gastric bypass 2000, start 355#   Pancreatic cyst    Pulmonary nodules    Thyroid  disease    on meds   Past Surgical History:  Procedure Laterality Date   CHOLECYSTECTOMY  2000   COLONOSCOPY WITH PROPOFOL  N/A 03/05/2020   Procedure: COLONOSCOPY WITH PROPOFOL ;  Surgeon: Mansouraty, Aloha Raddle., MD;  Location: [facility] ENDOSCOPY;  Service: Gastroenterology;  Laterality: N/A;   CORONARY STENT INTERVENTION N/A 08/13/2022   Procedure: CORONARY STENT INTERVENTION;  Surgeon: Verlin Lonni BIRCH, MD;  Location: [facility] INVASIVE CV LAB;  Service: Cardiovascular;  Laterality: N/A;   CRANIOTOMY FOR TUMOR  12/11/2015   metastatic melanoma   ENTEROSCOPY N/A 03/30/2020   Procedure: ENTEROSCOPY;  Surgeon: Abran Norleen SAILOR, MD;  Location: [facility] ENDOSCOPY;  Service: Endoscopy;  Laterality: N/A;   ESOPHAGOGASTRODUODENOSCOPY N/A 03/30/2020   Procedure: ESOPHAGOGASTRODUODENOSCOPY (EGD);  Surgeon: Abran Norleen SAILOR, MD;  Location: [facility]COPY;  Service: Endoscopy;  Laterality: N/A;   ESOPHAGOGASTRODUODENOSCOPY (EGD) WITH PROPOFOL  N/A 02/14/2020   Procedure: ESOPHAGOGASTRODUODENOSCOPY (EGD) WITH PROPOFOL ;  Surgeon: San Sandor GAILS, DO;  Location: [facility] ENDOSCOPY;  Service: Endoscopy;  Laterality: N/A;   ESOPHAGOGASTRODUODENOSCOPY (EGD) WITH PROPOFOL  N/A 02/26/2020   Procedure: ESOPHAGOGASTRODUODENOSCOPY (EGD) WITH PROPOFOL ;  Surgeon: Abran Norleen SAILOR, MD;  Location: [facility] ENDOSCOPY;  Service: Endoscopy;  Laterality: N/A;    ESOPHAGOGASTRODUODENOSCOPY (EGD) WITH PROPOFOL  N/A 03/03/2020   Procedure: ESOPHAGOGASTRODUODENOSCOPY (EGD) WITH PROPOFOL ;  Surgeon: Rollin Dover, MD;  Location: [facility] ENDOSCOPY;  Service: Endoscopy;  Laterality: N/A;   ESOPHAGOGASTRODUODENOSCOPY (EGD) WITH PROPOFOL  N/A 03/28/2020   Procedure: ESOPHAGOGASTRODUODENOSCOPY (EGD) WITH PROPOFOL ;  Surgeon: Abran Norleen SAILOR, MD;  Location: [facility] ENDOSCOPY;  Service: Endoscopy;  Laterality: N/A;   FOREIGN BODY REMOVAL  03/30/2020   Procedure: FOREIGN BODY REMOVAL;  Surgeon: Abran Norleen SAILOR, MD;  Location: [facility] ENDOSCOPY;  Service: Endoscopy;;   GIVENS CAPSULE STUDY N/A 03/28/2020   Procedure: GIVENS CAPSULE STUDY;  Surgeon: Abran Norleen SAILOR, MD;  Location: [facility] ENDOSCOPY;  Service: Endoscopy;  Laterality: N/A;   HEMOSTASIS CONTROL  02/26/2020   Procedure: HEMOSTASIS CONTROL;  Surgeon: Abran Norleen SAILOR, MD;  Location[facility] ENDOSCOPY;  Service: Endoscopy;;   HEMOSTASIS CONTROL  03/30/2020   Procedure: HEMOSTASIS CONTROL;  Surgeon: Abran Norleen SAILOR, MD;  Location: [facility] ENDOSCOPY;  Service: Endoscopy;;   HOT HEMOSTASIS N/A 02/26/2020   Procedure: HOT HEMOSTASIS (ARGON PLASMA COAGULATION/BICAP);  Surgeon: Abran Norleen SAILOR, MD;  Location: [facility] ENDOSCOPY;  Service: Endoscopy;  Laterality: N/A;   MOHS  SURGERY  2012   melanoma, mid back, [place]    RIGHT/LEFT HEART CATH AND CORONARY ANGIOGRAPHY N/A 08/13/2022   Procedure: RIGHT/LEFT HEART CATH AND CORONARY ANGIOGRAPHY;  Surgeon: Verlin Lonni BIRCH, MD;  Location: [facility] INVASIVE CV LAB;  Service: Cardiovascular;  Laterality: N/A;   ROUX-EN-Y PROCEDURE  2000   TOTAL SHOULDER REPLACEMENT Right 2011   [place]    WISDOM TOOTH EXTRACTION      Home Medications:  Prior to Admission medications  Medication Sig Start Date End Date Taking? Authorizing Provider  acetaminophen  (TYLENOL ) 325 MG tablet Take 2 tablets (650 mg total) by mouth every 6 (six) hours as needed for moderate pain. 04/01/20  Yes Arrien, Elidia Sieving, MD  Calcium   Carb-Cholecalciferol (CALCIUM  + D3 PO) Take 2 tablets by mouth daily. Chew 2 gummies daily   Yes [provider]  clonazePAM  (KLONOPIN ) 1 MG tablet Take 1 tablet (1 mg total) by mouth 2 (two) times daily as needed for anxiety. 02/19/24  Yes Burns, Glade PARAS, MD  clopidogrel  (PLAVIX ) 75 MG tablet TAKE 1 TABLET(75 MG) BY MOUTH DAILY WITH BREAKFAST 12/10/23  Yes Carlin Delon BROCKS, NP  empagliflozin  (JARDIANCE ) 10 MG TABS tablet Take 1 tablet (10 mg total) by mouth daily before breakfast. 11/30/23  Yes Carlin Delon BROCKS, NP  finasteride  (PROSCAR ) 5 MG tablet TAKE 1 TABLET(5 MG) BY MOUTH DAILY Patient taking differently: 5 mg at bedtime. 11/29/23  Yes Burns, Glade PARAS, MD  levothyroxine  (SYNTHROID ) 88 MCG tablet TAKE 1 TABLET BY MOUTH EVERY DAY BEFORE BREAKFAST 01/03/24  Yes Burns, Glade PARAS, MD  Multiple Vitamin (MULTIVITAMIN WITH MINERALS) TABS tablet Take 1 tablet by mouth in the morning.   Yes [provider]  nitroGLYCERIN  (NITROSTAT ) 0.4 MG SL tablet Place 1 tablet (0.4 mg total) under the tongue every 5 (five) minutes as needed. 11/30/23  Yes Carlin Delon BROCKS, NP  pantoprazole  (PROTONIX ) 40 MG tablet Take 1 tablet (40 mg total) by mouth daily. 12/20/23  Yes Verlin Lonni BIRCH, MD  predniSONE  (DELTASONE ) 5 MG tablet TAKE 1 TABLET BY MOUTH EVERY DAY WITH BREAKFAST 02/23/24  Yes Burns, Glade PARAS, MD  simvastatin  (ZOCOR ) 20 MG tablet Take 1 tablet (20 mg total) by mouth at bedtime. Patient taking differently: Take 20 mg by mouth daily. 11/30/23  Yes Carlin Delon C, NP  tamsulosin  (FLOMAX ) 0.4 MG CAPS capsule TAKE 1 CAPSULE BY MOUTH EVERY DAY AFTER SUPPER 09/20/23  Yes Burns, Glade PARAS, MD  DULoxetine  (CYMBALTA ) 60 MG capsule Take 1 capsule (60 mg total) by mouth daily. Patient not taking: Reported on 03/09/2024 09/20/23   Geofm Glade PARAS, MD   Scheduled Meds:  clopidogrel   75 mg Oral Daily   enoxaparin  (LOVENOX ) injection  40 mg Subcutaneous Q24H   finasteride   5 mg Oral Daily    levothyroxine   88 mcg Oral Q0600   pantoprazole   40 mg Oral Daily   predniSONE   5 mg Oral Q breakfast   simvastatin   20 mg Oral QHS   tamsulosin   0.4 mg Oral QPC supper   Continuous Infusions:  PRN Meds: albuterol , clonazePAM   Allergies:   Allergies[1]  Social History:   Social History   Socioeconomic History   Marital status: Married    Spouse name: Kirsi   Number of children: 2   Years of education: Not on file   Highest education level: Some college, no degree  Occupational History   Occupation: general contractor-retired  Tobacco Use   Smoking status: Former    Current packs/day: 0.00  Types: Cigarettes    Quit date: 02/03/1963    Years since quitting: 61.1   Smokeless tobacco: Never  Vaping Use   Vaping status: Never Used  Substance and Sexual Activity   Alcohol use: Not Currently    Comment: wine 2x/week   Drug use: Never   Sexual activity: Not on file  Other Topics Concern   Not on file  Social History Narrative   Lives with wife/2025 and 1 dog   [place]   Social Drivers of Health   Tobacco Use: Medium Risk (03/09/2024)   Patient History    Smoking Tobacco Use: Former    Smokeless Tobacco Use: Never    Passive Exposure: Not on Actuary Strain: Low Risk (08/17/2023)   Overall Financial Resource Strain (CARDIA)    Difficulty of Paying Living Expenses: Not hard at all  Food Insecurity: No Food Insecurity (03/10/2024)   Epic    Worried About Radiation Protection Practitioner of Food in the Last Year: Never true    Ran Out of Food in the Last Year: Never true  Transportation Needs: No Transportation Needs (03/10/2024)   Epic    Lack of Transportation (Medical): No    Lack of Transportation (Non-Medical): No  Physical Activity: Sufficiently Active (08/17/2023)   Exercise Vital Sign    Days of Exercise per Week: 7 days    Minutes of Exercise per Session: 30 min  Stress: No Stress Concern Present (08/17/2023)    [employer] of Occupational Health - Occupational Stress Questionnaire    Feeling of Stress: Not at all  Social Connections: Moderately Isolated (03/10/2024)   Social Connection and Isolation Panel    Frequency of Communication with Friends and Family: Three times a week    Frequency of Social Gatherings with Friends and Family: Once a week    Attends Religious Services: Never    Database Administrator or Organizations: No    Attends Banker Meetings: Never    Marital Status: Married  Catering Manager Violence: Not At Risk (03/10/2024)   Epic    Fear of Current or Ex-Partner: No    Emotionally Abused: No    Physically Abused: No    Sexually Abused: No  Depression (PHQ2-9): Low Risk (08/18/2023)   Depression (PHQ2-9)    PHQ-2 Score: 3  Alcohol Screen: Not on file  Housing: Low Risk (03/10/2024)   Epic    Unable to Pay for Housing in the Last Year: No    Number of Times Moved in the Last Year: 0    Homeless in the Last Year: No  Utilities: Not At Risk (03/10/2024)   Epic    Threatened with loss of utilities: No  Health Literacy: Adequate Health Literacy (08/18/2023)   B1300 Health Literacy    Frequency of need for help with medical instructions: Never    Family History:   Family History  Problem Relation Age of Onset   Hypertension Father    Macular degeneration Mother    Melanoma Mother    Melanoma Sister    Stomach cancer Sister 68       duodenal   Breast cancer Sister    Uterine cancer Sister    Melanoma Brother    Colon polyps Neg Hx    Colon cancer Neg Hx    Esophageal cancer Neg Hx    Rectal cancer Neg Hx     ROS:  Please see the history of  present illness.  All other ROS reviewed and negative.     Physical Exam/Data: Vitals:   03/10/24 0207 03/10/24 0820 03/10/24 0900 03/10/24 1152  BP: (!) 147/77 139/69 (!) 125/43 128/72  Pulse: 79 74 72 72  Resp: 18 17 14 18   Temp: 99.3 F (37.4 C) 98.4 F (36.9 C)  98 F (36.7 C)  TempSrc: Oral Oral   Oral  SpO2: 100% 100% 97% 100%  Weight:      Height:        Intake/Output Summary (Last 24 hours) at 03/10/2024 1329 Last data filed at 03/10/2024 0801 Gross per 24 hour  Intake 1000 ml  Output --  Net 1000 ml      03/09/2024   11:15 AM 03/09/2024    9:31 AM 11/29/2023    7:46 AM  Last 3 Weights  Weight (lbs) 198 lb 199 lb 220 lb 12.8 oz  Weight (kg) 89.812 kg 90.266 kg 100.154 kg     Body mass index is 27.62 kg/m.   General:  Well nourished, well developed, in no acute distress HEENT: normal Neck: no JVD Vascular: No carotid bruits; Distal pulses 2+ bilaterally Cardiac:  normal S1, S2; RRR; no murmur  Lungs:  clear to auscultation bilaterally, no wheezing, rhonchi or rales  Abd: soft, nontender, patient reports increase in abdominal fat in the last month Ext: 2+ LE edema, L > R Musculoskeletal:  No deformities, BUE and BLE strength normal and equal Skin: warm and dry  Neuro:  no focal abnormalities noted Psych:  Normal affect   EKG:  The EKG was personally reviewed and demonstrates: sinus rhythm, no acute ischemic changes   Telemetry:  Telemetry was personally reviewed and demonstrates: Sinus  Relevant CV Studies:  Echocardiogram, 01/12/2024 Left ventricular ejection fraction, by estimation, is 55 to 60% . The left ventricle has normal function. The left ventricle has no regional wall motion abnormalities. Left ventricular diastolic function could not be evaluated.  Right ventricular systolic function is normal. The right ventricular size is mildly enlarged.  Left atrial size was moderately dilated.  The mitral valve is normal in structure. Mild mitral valve regurgitation. No evidence of mitral stenosis. Moderate mitral annular calcification.  The aortic valve is normal in structure. Aortic valve regurgitation is not visualized. Aortic valve sclerosis/ calcification is present, without any evidence of aortic stenosis.  The inferior vena cava is normal in size with greater  than 50% respiratory variability, suggesting right atrial pressure of 3 mmHg.  Right/left heart cath, 08/13/2022   Mid Cx to Dist Cx lesion is 40% stenosed.   Prox LAD lesion is 95% stenosed.   1st Mrg lesion is 40% stenosed.   A drug-eluting stent was successfully placed using a STENT ONYX FRONTIER 3.5X15.   Post intervention, there is a 0% residual stenosis.   Severe proximal LAD stenosis Successful PTCA/DES x 1 proximal LAD Moderate non-obstructive disease in the first obtuse marginal ostium and in the distal Circumflex No disease in the large dominant RCA     Recommendations: DAPT with ASA/Plavix  for at least six months. Change Prilosec to Protonix  given use of Plavix .   Laboratory Data: High Sensitivity Troponin:  No results for input(s): TROPONINIHS in the last 720 hours.  Recent Labs  Lab 03/09/24 1755 03/09/24 1939  TRNPT 21* 24*      Chemistry Recent Labs  Lab 03/09/24 1146 03/09/24 1939 03/09/24 2000 03/10/24 0403  NA 138  --  139 141  K 4.4  --  4.4  4.2  CL 103  --   --  109  CO2 19*  --   --  22  GLUCOSE 103*  --   --  75  BUN 18  --   --  15  CREATININE 1.51* 1.34*  --  1.30*  CALCIUM  9.7  --   --  8.4*  GFRNONAA 47* 54*  --  56*  ANIONGAP 17*  --   --  9    Recent Labs  Lab 03/09/24 1146 03/10/24 0403  PROT 6.7 5.0*  ALBUMIN 4.0 3.0*  AST 30 27  ALT 20 14  ALKPHOS 71 53  BILITOT 1.2 0.7   Lipids No results for input(s): CHOL, TRIG, HDL, LABVLDL, LDLCALC, CHOLHDL in the last 168 hours.  Hematology Recent Labs  Lab 03/09/24 1146 03/09/24 2000 03/10/24 0403  WBC 7.8  --  6.2  RBC 5.18  --  4.23  HGB 13.1 12.2* 10.9*  HCT 43.5 36.0* 35.4*  MCV 84.0  --  83.7  MCH 25.3*  --  25.8*  MCHC 30.1  --  30.8  RDW 18.3*  --  18.1*  PLT 205  --  186   Thyroid   Recent Labs  Lab 03/09/24 1939 03/09/24 2000  TSH  --  0.233*  FREET4 1.96  --     BNP Recent Labs  Lab 03/09/24 1146  PROBNP 2,199.0*    DDimer  Recent Labs   Lab 03/09/24 1146  DDIMER 1.77*    Radiology/Studies:  CT Angio Chest PE W and/or Wo Contrast Result Date: 03/09/2024 CLINICAL DATA:  Short of breath, positive D-dimer, history of melanoma EXAM: CT ANGIOGRAPHY CHEST WITH CONTRAST TECHNIQUE: Multidetector CT imaging of the chest was performed using the standard protocol during bolus administration of intravenous contrast. Multiplanar CT image reconstructions and MIPs were obtained to evaluate the vascular anatomy. RADIATION DOSE REDUCTION: This exam was performed according to the departmental dose-optimization program which includes automated exposure control, adjustment of the mA and/or kV according to patient size and/or use of iterative reconstruction technique. CONTRAST:  75mL OMNIPAQUE  IOHEXOL  350 MG/ML SOLN COMPARISON:  05/20/2022, 03/09/2024 FINDINGS: Cardiovascular: This is a technically adequate evaluation of the pulmonary vasculature. No filling defects or pulmonary emboli. Heart is unremarkable without pericardial effusion. Mild calcifications of the aortic and mitral valves. No evidence of thoracic aortic aneurysm or dissection. Atherosclerosis of the aorta and coronary vasculature. Mediastinum/Nodes: No enlarged mediastinal, hilar, or axillary lymph nodes. Thyroid  gland, trachea, and esophagus demonstrate no significant findings. Small hiatal hernia. Lungs/Pleura: Stable basilar predominant subpleural scarring. No acute airspace disease, effusion, or pneumothorax. Central airways are patent. There is a stable 3 mm lingular nodule reference image 83/13, which can be considered benign given long-term stability and size. There are no other pulmonary nodules to correspond to the reported nodularity on chest x-ray, which likely reflected summation of shadows. Upper Abdomen: Previous bariatric surgery. Small hiatal hernia. No acute upper abdominal findings. Musculoskeletal: Since the 2014 exam, a mid sternal fracture has developed, with approximately 5  mm of ventral displacement of the distal sternal fracture fragment. A fracture line is still visible, sclerotic margins are seen along the fracture line, which is still visible. Right shoulder arthroplasty. Stable lymphadenectomy clips within the left axilla. Reconstructed images demonstrate no additional findings. Review of the MIP images confirms the above findings. IMPRESSION: 1. No evidence of pulmonary embolus. 2. No acute airspace disease. 3. Interval development of a mid sternal fracture as above, with sclerosis along the persistent fracture  line. I would favor a healing subacute fracture, though a chronic nonunion cap a similar appearance. No signs of intrathoracic metastatic disease to suggest a pathologic fracture. Please correlate with physical exam findings and any history of prior trauma. 4. Small hiatal hernia, with postsurgical changes from prior bariatric surgery. 5. Aortic Atherosclerosis (ICD10-I70.0). Coronary artery atherosclerosis. Electronically Signed   By: Ozell Daring M.D.   On: 03/09/2024 17:49   DG Chest Port 1 View Result Date: 03/09/2024 CLINICAL DATA:  Shortness of breath with dizziness and fatigue. EXAM: PORTABLE CHEST 1 VIEW COMPARISON:  January 19, 2019 FINDINGS: The heart size and mediastinal contours are within normal limits. A 2 mm nodular opacity is seen overlying the periphery of the upper left lung. No acute infiltrate, pleural effusion or pneumothorax is identified. Radiopaque surgical clips are seen along the left axilla. There is evidence of prior right shoulder arthroplasty. Chronic right rib deformities are noted. Degenerative changes are noted throughout the thoracic spine. IMPRESSION: 1. No acute cardiopulmonary disease. 2. Findings may represent a 2 mm left upper lobe pulmonary nodule. Correlation with nonemergent chest CT is recommended. Electronically Signed   By: Suzen Dials M.D.   On: 03/09/2024 12:39   Assessment and Plan:  Exertional dyspnea,  fatigue Patient reports progressively worsening exertional dyspnea/fatigue x 1 month Significantly worsening over the last few days prior to arrival Unable to do even mild activities without symptoms Significant oncologic history, followed by [facility] Recent intentional weight loss Patient appears euvolemic on exam, 2+ LE edema L > R Has not taken as needed Lasix  recently Denies any chest pain, orthopnea Admits to having some increase in central abdominal fat Has been on steroids for about 10 years Patient may need to be seen by endocrine to ensure long-term steroid use is appropriate and no concerns for possible endocrine issues  Do not suspect need for IV diuresis, will discuss with MD  Heart failure with improved EF LVEF 30-35 06/2022 ?  55 to 60% 12/25 Home meds: Jardiance  10 mg daily, PRN Lasix  for weight gain Previously not able to tolerate Entresto , carvedilol , valsartan   proBNP 2,199 CTPA: No pleural effusion, no pericardial effusion Patient appears euvolemic on exam, 2+ LE edema L > R Has not taken as needed Lasix  recently Denies any chest pain, orthopnea Admits to having some increase in central abdominal fat Do not suspect need for IV diuresis, will discuss with MD  CAD s/p DES to proximal LAD 2024 Hyperlipidemia R/LHC 08/2022: Severe pLAD stenosis treated with PTCA/DES, moderate nonobstructive disease in OM1, distal dLCx no disease in RCA Home meds: Plavix  75 mg daily, simvastatin  20 mg daily No active chest pain Troponins mildly elevated and flat, not consistent with ACS EKG nonischemic Continue Plavix , statin  Per primary Metastatic melanoma Metastasis to the brain Pathologic fractures Chronic pain Hypothyroidism Adrenal insufficiency BPH GERD Recent weight loss AKI vs CKD Low cortisol level   Risk Assessment/Risk Scores:      [place]  Heart Association (NYHA) Functional Class NYHA Class III   For questions or updates, please contact [facility] Please consult [email or web address] for contact info under   Signed, Waddell DELENA Donath, PA-C  03/10/2024 1:29 PM  Personally seen and examined. Agree with above.  79 year old with chronic illness adrenal insufficiency secondary to cancer therapy with normal ejection fraction in December 2025 after stent to proximal LAD in 2024 here with increasing fatigue weakness acute on chronic.  CTA of chest showed no airspace disease no evidence of pulmonary  edema EKG unremarkable  On exam does not appear to be overloaded with fluid.  Appears to be euvolemic. Conditioning seems to have decreased significantly over the past few months.  He has lost weight recently.  A.m. random cortisol was less than 0.4.  proBNP 2000 however may be elevated for various reasons, and clinically does not appear to be volume overloaded.  Mild lower extremity edema left greater than right on exam however lungs appear clear  I do not think his current situation is cardiac related.  May need to focus on adrenal insufficiency aspect but I will defer to internal medicine/hospitalist team for further evaluation on ongoing decline/fatigue.  Obviously frustrating to him and his family.  Does not need any IV Lasix .  We will go ahead and sign off.  Please let us  know if we can be of further assistance.  Oneil Parchment, MD     [1]  Allergies Allergen Reactions   Rosuvastatin  Other (See Comments)    Leg pains   "

## 2024-03-27 ENCOUNTER — Ambulatory Visit: Admitting: Internal Medicine

## 2024-08-18 ENCOUNTER — Ambulatory Visit
